# Patient Record
Sex: Male | Born: 1940
Health system: Southern US, Community
[De-identification: ages and names within clinical notes are randomized; demographics above are authoritative.]

## PROBLEM LIST (undated history)

## (undated) DIAGNOSIS — E039 Hypothyroidism, unspecified: Secondary | ICD-10-CM

## (undated) DIAGNOSIS — M51369 Other intervertebral disc degeneration, lumbar region without mention of lumbar back pain or lower extremity pain: Secondary | ICD-10-CM

## (undated) DIAGNOSIS — M48061 Spinal stenosis, lumbar region without neurogenic claudication: Secondary | ICD-10-CM

## (undated) DIAGNOSIS — I10 Essential (primary) hypertension: Secondary | ICD-10-CM

## (undated) DIAGNOSIS — M810 Age-related osteoporosis without current pathological fracture: Secondary | ICD-10-CM

## (undated) DIAGNOSIS — I5189 Other ill-defined heart diseases: Secondary | ICD-10-CM

## (undated) DIAGNOSIS — I5022 Chronic systolic (congestive) heart failure: Secondary | ICD-10-CM

## (undated) DIAGNOSIS — Z79899 Other long term (current) drug therapy: Secondary | ICD-10-CM

## (undated) DIAGNOSIS — R7303 Prediabetes: Secondary | ICD-10-CM

## (undated) DIAGNOSIS — M1990 Unspecified osteoarthritis, unspecified site: Secondary | ICD-10-CM

## (undated) DIAGNOSIS — N1832 Chronic kidney disease, stage 3b: Secondary | ICD-10-CM

## (undated) DIAGNOSIS — I447 Left bundle-branch block, unspecified: Secondary | ICD-10-CM

## (undated) DIAGNOSIS — I4891 Unspecified atrial fibrillation: Secondary | ICD-10-CM

## (undated) DIAGNOSIS — Z8619 Personal history of other infectious and parasitic diseases: Secondary | ICD-10-CM

## (undated) DIAGNOSIS — K219 Gastro-esophageal reflux disease without esophagitis: Secondary | ICD-10-CM

## (undated) DIAGNOSIS — E785 Hyperlipidemia, unspecified: Secondary | ICD-10-CM

## (undated) DIAGNOSIS — M5136 Other intervertebral disc degeneration, lumbar region: Secondary | ICD-10-CM

## (undated) DIAGNOSIS — J449 Chronic obstructive pulmonary disease, unspecified: Secondary | ICD-10-CM

## (undated) DIAGNOSIS — T7840XA Allergy, unspecified, initial encounter: Secondary | ICD-10-CM

## (undated) DIAGNOSIS — Z972 Presence of dental prosthetic device (complete) (partial): Secondary | ICD-10-CM

## (undated) DIAGNOSIS — I739 Peripheral vascular disease, unspecified: Secondary | ICD-10-CM

## (undated) DIAGNOSIS — Z7901 Long term (current) use of anticoagulants: Secondary | ICD-10-CM

## (undated) DIAGNOSIS — T8859XA Other complications of anesthesia, initial encounter: Secondary | ICD-10-CM

## (undated) DIAGNOSIS — I420 Dilated cardiomyopathy: Secondary | ICD-10-CM

## (undated) HISTORY — DX: Personal history of other infectious and parasitic diseases: Z86.19

## (undated) HISTORY — DX: Essential (primary) hypertension: I10

## (undated) HISTORY — DX: Allergy, unspecified, initial encounter: T78.40XA

## (undated) HISTORY — DX: Hyperlipidemia, unspecified: E78.5

## (undated) HISTORY — DX: Unspecified osteoarthritis, unspecified site: M19.90

## (undated) HISTORY — DX: Gastro-esophageal reflux disease without esophagitis: K21.9

## (undated) HISTORY — PX: BACK SURGERY: SHX140

---

## 2001-07-13 ENCOUNTER — Inpatient Hospital Stay (HOSPITAL_COMMUNITY): Admission: EM | Admit: 2001-07-13 | Discharge: 2001-07-19 | Payer: Self-pay | Admitting: Emergency Medicine

## 2001-07-13 ENCOUNTER — Encounter: Payer: Self-pay | Admitting: Emergency Medicine

## 2001-07-14 ENCOUNTER — Encounter: Payer: Self-pay | Admitting: Neurosurgery

## 2001-07-15 ENCOUNTER — Encounter: Payer: Self-pay | Admitting: Neurosurgery

## 2004-03-27 HISTORY — PX: UPPER GASTROINTESTINAL ENDOSCOPY: SHX188

## 2005-09-28 HISTORY — PX: OTHER SURGICAL HISTORY: SHX169

## 2005-10-22 ENCOUNTER — Ambulatory Visit: Payer: Self-pay | Admitting: Unknown Physician Specialty

## 2009-06-20 ENCOUNTER — Ambulatory Visit: Payer: Self-pay | Admitting: Unknown Physician Specialty

## 2009-06-20 LAB — HM COLONOSCOPY

## 2012-09-16 ENCOUNTER — Ambulatory Visit: Payer: Self-pay | Admitting: Unknown Physician Specialty

## 2012-10-04 DIAGNOSIS — I509 Heart failure, unspecified: Secondary | ICD-10-CM | POA: Insufficient documentation

## 2012-10-28 HISTORY — PX: OTHER SURGICAL HISTORY: SHX169

## 2012-11-17 ENCOUNTER — Ambulatory Visit: Payer: Self-pay | Admitting: Cardiology

## 2012-11-17 DIAGNOSIS — I2510 Atherosclerotic heart disease of native coronary artery without angina pectoris: Secondary | ICD-10-CM

## 2012-11-17 HISTORY — PX: LEFT HEART CATH AND CORONARY ANGIOGRAPHY: CATH118249

## 2012-11-17 HISTORY — DX: Atherosclerotic heart disease of native coronary artery without angina pectoris: I25.10

## 2013-02-17 DIAGNOSIS — I420 Dilated cardiomyopathy: Secondary | ICD-10-CM | POA: Insufficient documentation

## 2013-02-17 DIAGNOSIS — Z9889 Other specified postprocedural states: Secondary | ICD-10-CM | POA: Insufficient documentation

## 2013-06-02 ENCOUNTER — Ambulatory Visit: Payer: Self-pay | Admitting: Cardiology

## 2013-06-21 HISTORY — PX: DOPPLER ECHOCARDIOGRAPHY: SHX263

## 2013-10-31 LAB — LIPID PANEL
Cholesterol: 129 mg/dL (ref 0–200)
HDL: 47 mg/dL (ref 35–70)
LDL CALC: 60 mg/dL
TRIGLYCERIDES: 111 mg/dL (ref 40–160)

## 2014-05-23 LAB — PSA: PSA: 1.6

## 2014-10-20 NOTE — Op Note (Signed)
PATIENT NAME:  [name], [name] MR#:  [id number] DATE OF BIRTH:  22-Feb-1941  DATE OF PROCEDURE:  06/02/2013  REFERRING PHYSICIAN: Dr. Caryn Section.  PREPROCEDURE DIAGNOSIS: Atrial fibrillation.   PROCEDURE: Elective DC cardioversion.   INDICATION: The patient is a 73 year old gentleman with history of severe dilated cardiomyopathy and atrial fibrillation.   PROCEDURE: The patient was brought to the special holding area in a fasting state. The patient received 150 mg of propofol. The patient received 2 shocks at 75 and 150 joules, and converted to sinus rhythm. There were no complications. ____________________________ Isaias Cowman, MD ap:aw D: 06/02/2013 07:44:56 ET T: 06/02/2013 08:05:46 ET JOB#: [id number]  cc: Isaias Cowman, MD, <Dictator> Isaias Cowman MD ELECTRONICALLY SIGNED 06/17/2013 8:51

## 2014-11-22 LAB — BASIC METABOLIC PANEL
BUN: 15 mg/dL (ref 4–21)
Creatinine: 1.4 mg/dL — AB (ref 0.6–1.3)
GLUCOSE: 95 mg/dL
POTASSIUM: 3.8 mmol/L (ref 3.4–5.3)
SODIUM: 139 mmol/L (ref 137–147)

## 2014-11-22 LAB — TSH: TSH: 7.99 u[IU]/mL — AB (ref 0.41–5.90)

## 2014-12-18 ENCOUNTER — Other Ambulatory Visit: Payer: Self-pay | Admitting: Family Medicine

## 2014-12-18 DIAGNOSIS — I4891 Unspecified atrial fibrillation: Secondary | ICD-10-CM

## 2014-12-18 DIAGNOSIS — E039 Hypothyroidism, unspecified: Secondary | ICD-10-CM

## 2014-12-18 DIAGNOSIS — I10 Essential (primary) hypertension: Secondary | ICD-10-CM

## 2014-12-18 DIAGNOSIS — E785 Hyperlipidemia, unspecified: Secondary | ICD-10-CM

## 2014-12-18 NOTE — Telephone Encounter (Signed)
Pt contacted office for refill request on the following medications:  Xarelto 20mg , Lovastatin 20mg , Lisinopril-Hydrochlorothiazide10-12.5mg  and Levothyroxine Sodium 75MCG.  Pt is requesting a 90 day supply.   [facility] mail order Phone [phone] option 2.  [id number], Janet/MJ

## 2014-12-19 DIAGNOSIS — E785 Hyperlipidemia, unspecified: Secondary | ICD-10-CM | POA: Insufficient documentation

## 2014-12-19 DIAGNOSIS — E039 Hypothyroidism, unspecified: Secondary | ICD-10-CM | POA: Insufficient documentation

## 2014-12-19 DIAGNOSIS — I10 Essential (primary) hypertension: Secondary | ICD-10-CM | POA: Insufficient documentation

## 2014-12-19 DIAGNOSIS — I4891 Unspecified atrial fibrillation: Secondary | ICD-10-CM | POA: Insufficient documentation

## 2014-12-19 MED ORDER — LISINOPRIL-HYDROCHLOROTHIAZIDE 10-12.5 MG PO TABS: 1.0000 | ORAL_TABLET | Freq: Every day | ORAL | Status: DC

## 2014-12-19 MED ORDER — LOVASTATIN 20 MG PO TABS: 20.0000 mg | ORAL_TABLET | Freq: Every day | ORAL | Status: DC

## 2014-12-19 MED ORDER — LEVOTHYROXINE SODIUM 75 MCG PO TABS: 75.0000 ug | ORAL_TABLET | Freq: Every day | ORAL | Status: DC

## 2014-12-19 MED ORDER — XARELTO 20 MG PO TABS: 20.0000 mg | ORAL_TABLET | Freq: Every day | ORAL | Status: DC

## 2014-12-20 ENCOUNTER — Telehealth: Payer: Self-pay | Admitting: Family Medicine

## 2014-12-22 ENCOUNTER — Other Ambulatory Visit: Payer: Self-pay | Admitting: Family Medicine

## 2014-12-22 DIAGNOSIS — E785 Hyperlipidemia, unspecified: Secondary | ICD-10-CM

## 2014-12-22 DIAGNOSIS — E039 Hypothyroidism, unspecified: Secondary | ICD-10-CM

## 2014-12-22 DIAGNOSIS — I10 Essential (primary) hypertension: Secondary | ICD-10-CM

## 2014-12-22 DIAGNOSIS — I4891 Unspecified atrial fibrillation: Secondary | ICD-10-CM

## 2014-12-22 MED ORDER — LISINOPRIL-HYDROCHLOROTHIAZIDE 10-12.5 MG PO TABS: 1.0000 | ORAL_TABLET | Freq: Every day | ORAL | Status: DC

## 2014-12-22 MED ORDER — LEVOTHYROXINE SODIUM 75 MCG PO TABS: 75.0000 ug | ORAL_TABLET | Freq: Every day | ORAL | Status: DC

## 2014-12-22 MED ORDER — XARELTO 20 MG PO TABS: 20.0000 mg | ORAL_TABLET | Freq: Every day | ORAL | Status: DC

## 2014-12-22 MED ORDER — LOVASTATIN 20 MG PO TABS: 20.0000 mg | ORAL_TABLET | Freq: Every day | ORAL | Status: DC

## 2015-05-11 ENCOUNTER — Ambulatory Visit (INDEPENDENT_AMBULATORY_CARE_PROVIDER_SITE_OTHER): Payer: Medicare Other | Admitting: Family Medicine

## 2015-05-11 ENCOUNTER — Encounter: Payer: Self-pay | Admitting: Family Medicine

## 2015-05-11 VITALS — BP 108/58 | HR 66 | Temp 97.8°F | Resp 18 | Wt 253.4 lb

## 2015-05-11 DIAGNOSIS — J36 Peritonsillar abscess: Secondary | ICD-10-CM | POA: Diagnosis not present

## 2015-05-11 MED ORDER — AMOXICILLIN-POT CLAVULANATE 875-125 MG PO TABS: 1.0000 | ORAL_TABLET | Freq: Two times a day (BID) | ORAL | Status: DC

## 2015-05-11 NOTE — Progress Notes (Signed)
Subjective:     Patient ID: [name] Sr., male   DOB: [birth year], 74 y.o.   MRN: [id number]  HPI  Chief Complaint  Patient presents with  . Sore Throat    Patient comes in office today with concerns of sore throat on his right side more so than left, patient states that he has had this symptom for the past 4 days. Patient reports pain in the right ear, and difficulty swallowing liquids or solids  States he has been using saline rinses with little improvement. No exposure to children. Hx of uvulitis in April of this year.   Review of Systems  Constitutional: Negative for fever and chills.       Objective:   Physical Exam  Constitutional: He appears well-developed and well-nourished. No distress.  Ears: T.M's intact without inflammation Throat: Right sided mass effect from uvula to right tonsil which is partially obscured. Neck: no cervical adenopathy Lungs: clear     Assessment:    1. Peritonsillar abscess - Ambulatory referral to ENT - amoxicillin-clavulanate (AUGMENTIN) 875-125 MG tablet; Take 1 tablet by mouth 2 (two) times daily.  Dispense: 20 tablet; Refill: 0    Plan:    Per ENT

## 2015-05-11 NOTE — Patient Instructions (Signed)
F/u with ENT today.

## 2015-05-29 DIAGNOSIS — J309 Allergic rhinitis, unspecified: Secondary | ICD-10-CM | POA: Insufficient documentation

## 2015-05-29 DIAGNOSIS — K449 Diaphragmatic hernia without obstruction or gangrene: Secondary | ICD-10-CM | POA: Insufficient documentation

## 2015-05-29 DIAGNOSIS — M545 Low back pain, unspecified: Secondary | ICD-10-CM | POA: Insufficient documentation

## 2015-05-29 DIAGNOSIS — M1990 Unspecified osteoarthritis, unspecified site: Secondary | ICD-10-CM | POA: Insufficient documentation

## 2015-05-29 DIAGNOSIS — I8310 Varicose veins of unspecified lower extremity with inflammation: Secondary | ICD-10-CM | POA: Insufficient documentation

## 2015-05-29 DIAGNOSIS — K219 Gastro-esophageal reflux disease without esophagitis: Secondary | ICD-10-CM | POA: Insufficient documentation

## 2015-05-29 DIAGNOSIS — M7989 Other specified soft tissue disorders: Secondary | ICD-10-CM | POA: Insufficient documentation

## 2015-05-31 ENCOUNTER — Ambulatory Visit (INDEPENDENT_AMBULATORY_CARE_PROVIDER_SITE_OTHER): Payer: Medicare Other | Admitting: Family Medicine

## 2015-05-31 ENCOUNTER — Encounter: Payer: Self-pay | Admitting: Family Medicine

## 2015-05-31 VITALS — BP 110/60 | HR 71 | Temp 98.3°F | Resp 16 | Ht 74.0 in | Wt 251.0 lb

## 2015-05-31 DIAGNOSIS — I4891 Unspecified atrial fibrillation: Secondary | ICD-10-CM

## 2015-05-31 DIAGNOSIS — E039 Hypothyroidism, unspecified: Secondary | ICD-10-CM | POA: Diagnosis not present

## 2015-05-31 DIAGNOSIS — I10 Essential (primary) hypertension: Secondary | ICD-10-CM

## 2015-05-31 DIAGNOSIS — Z125 Encounter for screening for malignant neoplasm of prostate: Secondary | ICD-10-CM

## 2015-05-31 DIAGNOSIS — J309 Allergic rhinitis, unspecified: Secondary | ICD-10-CM | POA: Diagnosis not present

## 2015-05-31 DIAGNOSIS — E785 Hyperlipidemia, unspecified: Secondary | ICD-10-CM

## 2015-05-31 DIAGNOSIS — Z23 Encounter for immunization: Secondary | ICD-10-CM

## 2015-05-31 DIAGNOSIS — Z0000 Encounter for general adult medical examination without abnormal findings: Secondary | ICD-10-CM | POA: Diagnosis not present

## 2015-05-31 NOTE — Patient Instructions (Signed)
May take OTC loratadine (Claritin) or fexofenadine (Allegra) Do NOT take any decongestants

## 2015-05-31 NOTE — Progress Notes (Signed)
Patient: [name]., Male    DOB: 1941/05/04, 74 y.o.   MRN: [id number] Visit Date: 05/31/2015  Today's Provider: Lelon Huh, MD   Chief Complaint  Patient presents with  . Medicare Wellness  . Hypertension  . Hyperlipidemia  . Hypothyroidism  . Gastroesophageal Reflux   Subjective:    Annual wellness visit [name] SIXTO Sr. is a 74 y.o. male. He feels fairly well. He reports exercising yes. He reports he is sleeping fairly well.  -----------------------------------------------------------  Follow-up for GERD from 05/23/2014; started Nexium 20 mg prn. Follow-up for hypothyroidism from 11/22/2014; increased levothyroxine to 75 mcg qd.   Patient is having some sinus issues today with sore throat, nasal congestion, runny nose, sneezing and wheezing.   Hypertension, follow-up:  BP Readings from Last 3 Encounters:  05/31/15 110/60  05/11/15 108/58    He was last seen for hypertension 6 months ago.  BP at that visit was 122/62. Management since that visit includes; no changes were made.He reports good compliance with treatment. He is not having side effects. none  He is exercising. He is adherent to low salt diet.   Outside blood pressures are normal. He is experiencing none.  Patient denies none.   Cardiovascular risk factors include none.  Use of agents associated with hypertension: none.   ----------------------------------------------------------------------    Lipid/Cholesterol, Follow-up:   Last seen for this 10/04/2013.  Management since that visit includes continued lovastatin .  Last Lipid Panel:    Component Value Date/Time   CHOL 129 10/31/2013   TRIG 111 10/31/2013   HDL 47 10/31/2013   LDLCALC 60 10/31/2013    He reports good compliance with treatment. He is not having side effects. none  Wt Readings from Last 3 Encounters:  05/31/15 251 lb (113.853 kg)  05/11/15 253 lb 6.4 oz (114.941 kg)     ----------------------------------------------------------------------    Review of Systems  Constitutional: Negative.   HENT: Positive for rhinorrhea, sneezing and sore throat.   Eyes: Negative.   Respiratory: Positive for wheezing.   Cardiovascular: Negative.   Gastrointestinal: Negative.   Endocrine: Negative.   Genitourinary: Positive for frequency.  Musculoskeletal: Positive for back pain and arthralgias.  Skin: Negative.   Allergic/Immunologic: Positive for environmental allergies.  Neurological: Negative.   Hematological: Negative.   Psychiatric/Behavioral: Negative.     Social History   Social History  . Marital Status: Married    Spouse Name: N/A  . Number of Children: 2  . Years of Education: N/A   Occupational History  . Retired/ Disabled    Social History Main Topics  . Smoking status: Former Smoker -- 2.00 packs/day for 50 years    Types: Cigarettes    Quit date: 07/01/2007  . Smokeless tobacco: Never Used  . Alcohol Use: No  . Drug Use: No  . Sexual Activity: Not on file   Other Topics Concern  . Not on file   Social History Narrative    Patient Active Problem List   Diagnosis Date Noted  . Allergic rhinitis 05/29/2015  . Arthritis 05/29/2015  . GERD (gastroesophageal reflux disease) 05/29/2015  . Hiatal hernia 05/29/2015  . LBP (low back pain) 05/29/2015  . Pure hypercholesterolemia 05/29/2015  . Swelling of limb 05/29/2015  . Varicose veins of lower extremities with inflammation 05/29/2015  . Atrial fibrillation ([place]) 12/19/2014  . Hyperlipidemia 12/19/2014  . Hypothyroidism 12/19/2014  . Hypertension 12/19/2014  . History of cardiac catheterization 02/17/2013  . Congestive  cardiomyopathy ([place]) 02/17/2013  . Congestive heart failure, severe ([place]) 10/04/2012    Past Surgical History  Procedure Laterality Date  . Polyp excision  09/2005  . Back surgery      x5  . Doppler echocardiography  06/21/2013    EF=35% while in Sinus  Rhythm  . Myocardial perfusion scan  10/2012    severe global LV enlargement. Mild RV enlargement. Mild LVH. Mild mitral and tricuspid insufficency  . Upper gastrointestinal endoscopy  03/27/2004    Gastropathy, Gastritis; Duodenopathy    His family history includes Alzheimer's disease in his mother; Colon cancer in his brother; Heart attack in his father.    Previous Medications   AMIODARONE (PACERONE) 200 MG TABLET    TAKE 1 TABLET (200 MG TOTAL) BY MOUTH ONCE DAILY.   AMOXICILLIN-CLAVULANATE (AUGMENTIN) 875-125 MG TABLET    Take 1 tablet by mouth 2 (two) times daily.   ESOMEPRAZOLE (NEXIUM 24HR) 20 MG CAPSULE    Take 1 tablet by mouth daily as needed.   FLUTICASONE (FLONASE) 50 MCG/ACT NASAL SPRAY    Place 2 sprays into both nostrils at bedtime.   LEVOTHYROXINE (SYNTHROID, LEVOTHROID) 75 MCG TABLET    Take 1 tablet (75 mcg total) by mouth daily.   LISINOPRIL-HYDROCHLOROTHIAZIDE (PRINZIDE,ZESTORETIC) 10-12.5 MG PER TABLET    Take 1 tablet by mouth daily.   LOVASTATIN (MEVACOR) 20 MG TABLET    Take 1 tablet (20 mg total) by mouth daily.   METOPROLOL SUCCINATE (TOPROL-XL) 25 MG 24 HR TABLET    1 TABLET BY MOUTH ONCE A DAY   SALSALATE (DISALCID) 500 MG TABLET    Take 2 tablets by mouth 3 (three) times daily. for back pain   XARELTO 20 MG TABS TABLET    Take 1 tablet (20 mg total) by mouth daily.    Patient Care Team: Birdie Sons, MD as PCP - General (Family Medicine) Algernon Huxley, MD as Referring Physician (Vascular Surgery) Isaias Cowman, MD as Consulting Physician (Cardiology)     Objective:   Vitals: BP 110/60 mmHg  Pulse 71  Temp(Src) 98.3 F (36.8 C) (Oral)  Resp 16  Ht 6\' 2"  (1.88 m)  Wt 251 lb (113.853 kg)  BMI 32.21 kg/m2  SpO2 100%  Physical Exam   General Appearance:    Alert, cooperative, no distress, appears stated age  Head:    Normocephalic, without obvious abnormality, atraumatic  Eyes:    PERRL, conjunctiva/corneas clear, EOM's intact, fundi     benign, both eyes       Ears:    Normal TM's and external ear canals, both ears  Nose:   Nares normal, septum midline, mucosa normal, no drainage   or sinus tenderness  Throat:   Lips, mucosa, and tongue normal; teeth and gums normal  Neck:   Supple, symmetrical, trachea midline, no adenopathy;       thyroid:  No enlargement/tenderness/nodules; no carotid   bruit or JVD  Back:     Symmetric, no curvature, ROM normal, no CVA tenderness  Lungs:     Clear to auscultation bilaterally, respirations unlabored  Chest wall:    No tenderness or deformity  Heart:    Regular rate and rhythm, S1 and S2 normal, no murmur, rub   or gallop  Abdomen:     Soft, non-tender, bowel sounds active all four quadrants,    no masses, no organomegaly  Genitalia:    deferred  Rectal:    deferred  Extremities:   Extremities normal,  atraumatic, no cyanosis or edema  Pulses:   2+ and symmetric all extremities  Skin:   Skin color, texture, turgor normal, no rashes or lesions  Lymph nodes:   Cervical, supraclavicular, and axillary nodes normal  Neurologic:   CNII-XII intact. Normal strength, sensation and reflexes      throughout    Activities of Daily Living In your present state of health, do you have any difficulty performing the following activities: 05/31/2015  Hearing? N  Difficulty concentrating or making decisions? N  Walking or climbing stairs? N  Dressing or bathing? Y  Doing errands, shopping? N    Fall Risk Assessment Fall Risk  05/31/2015  Falls in the past year? No     Depression Screen PHQ 2/9 Scores 05/31/2015  PHQ - 2 Score 0  PHQ- 9 Score 0    Cognitive Testing - 6-CIT  Correct? Score   What year is it? yes 0 0 or 4  What month is it? yes 0 0 or 3  Memorize:    Pia Mau,  44,  [place],      What time is it? (within 1 hour) yes 0 0 or 3  Count backwards from 20 yes 0 0, 2, or 4  Name the months of the year yes 0 0, 2, or 4  Repeat name & address above yes 4 0, 2, 4,  6, 8, or 10       TOTAL SCORE  4/28   Interpretation:  Normal  Normal (0-7) Abnormal (8-28)    Audit-C Alcohol Use Screening  Question Answer Points  How often do you have alcoholic drink? never 0  On days you do drink alcohol, how many drinks do you typically consume? n/a 0  How oftey will you drink 6 or more in a total? never 0  Total Score:  0   A score of 3 or more in women, and 4 or more in men indicates increased risk for alcohol abuse, EXCEPT if all of the points are from question 1.     Assessment & Plan:     Annual Wellness Visit  Reviewed patient's Family Medical History Reviewed and updated list of patient's medical providers Assessment of cognitive impairment was done Assessed patient's functional ability Established a written schedule for health screening [place] Completed and Reviewed  Exercise Activities and Dietary recommendations Goals    None      Immunization History  Administered Date(s) Administered  . Pneumococcal Conjugate-13 05/23/2014  . Tdap 04/21/2014    Health Maintenance  Topic Date Due  . ZOSTAVAX  07/27/2002  . INFLUENZA VACCINE  01/29/2015  . PNA vac Low Risk Adult (2 of 2 - PPSV23) 05/24/2015  . COLONOSCOPY  06/21/2019  . TETANUS/TDAP  04/21/2024      Discussed health benefits of physical activity, and encouraged him to engage in regular exercise appropriate for his age and condition.    -------------------------------------------------------------------------------- 1. Annual physical exam   2. Atrial fibrillation, unspecified type ([place]) On amiodarone. Had recent follow up with Dr. Saralyn Pilar. Has not had eye exam for years.  - Ambulatory referral to Ophthalmology  3. Hyperlipidemia He is tolerating lovastatin well with no adverse effects.   - Lipid panel - ALT  4. Essential hypertension Well controlled.  Continue current medications.    - Renal function panel - Magnesium  5.  Hypothyroidism, unspecified hypothyroidism type  - TSH  6. Need for influenza vaccination  - Flu vaccine  HIGH DOSE PF  7. Prostate cancer screening  - PSA  8. Need for pneumococcal vaccination  - Pneumococcal polysaccharide vaccine 23-valent greater than or equal to 2yo subcutaneous/IM  9. Allergic rhinitis, unspecified allergic rhinitis type

## 2015-06-01 LAB — LIPID PANEL
CHOLESTEROL TOTAL: 144 mg/dL (ref 100–199)
Chol/HDL Ratio: 3.3 ratio units (ref 0.0–5.0)
HDL: 44 mg/dL (ref >39)
LDL Calculated: 63 mg/dL (ref 0–99)
TRIGLYCERIDES: 187 mg/dL — AB (ref 0–149)
VLDL CHOLESTEROL CAL: 37 mg/dL (ref 5–40)

## 2015-06-01 LAB — ALT: ALT: 20 IU/L (ref 0–44)

## 2015-06-01 LAB — TSH: TSH: 3.83 u[IU]/mL (ref 0.450–4.500)

## 2015-06-01 LAB — RENAL FUNCTION PANEL
ALBUMIN: 4.5 g/dL (ref 3.5–4.8)
BUN/Creatinine Ratio: 10 (ref 10–22)
BUN: 16 mg/dL (ref 8–27)
CALCIUM: 9.9 mg/dL (ref 8.6–10.2)
CO2: 24 mmol/L (ref 18–29)
Chloride: 97 mmol/L (ref 97–106)
Creatinine, Ser: 1.56 mg/dL — ABNORMAL HIGH (ref 0.76–1.27)
GFR calc Af Amer: 51 mL/min/{1.73_m2} — ABNORMAL LOW (ref >59)
GFR calc non Af Amer: 44 mL/min/{1.73_m2} — ABNORMAL LOW (ref >59)
GLUCOSE: 111 mg/dL — AB (ref 65–99)
PHOSPHORUS: 2.9 mg/dL (ref 2.5–4.5)
POTASSIUM: 4.6 mmol/L (ref 3.5–5.2)
SODIUM: 138 mmol/L (ref 136–144)

## 2015-06-01 LAB — PSA: PROSTATE SPECIFIC AG, SERUM: 1.7 ng/mL (ref 0.0–4.0)

## 2015-06-01 LAB — MAGNESIUM: Magnesium: 1.9 mg/dL (ref 1.6–2.3)

## 2015-06-04 DIAGNOSIS — Z23 Encounter for immunization: Secondary | ICD-10-CM

## 2015-06-27 ENCOUNTER — Ambulatory Visit (INDEPENDENT_AMBULATORY_CARE_PROVIDER_SITE_OTHER): Payer: Medicare Other | Admitting: Family Medicine

## 2015-06-27 ENCOUNTER — Encounter: Payer: Self-pay | Admitting: Family Medicine

## 2015-06-27 VITALS — BP 110/60 | HR 65 | Temp 97.6°F | Resp 18 | Wt 252.0 lb

## 2015-06-27 DIAGNOSIS — M5441 Lumbago with sciatica, right side: Secondary | ICD-10-CM | POA: Diagnosis not present

## 2015-06-27 MED ORDER — PREDNISONE 10 MG PO TABS: ORAL_TABLET | ORAL | Status: DC

## 2015-06-27 MED ORDER — CYCLOBENZAPRINE HCL 10 MG PO TABS: 10.0000 mg | ORAL_TABLET | Freq: Three times a day (TID) | ORAL | Status: DC | PRN

## 2015-06-27 NOTE — Progress Notes (Signed)
Patient: [name] SCHWABE Sr. Male    DOB: 1941/06/24   74 y.o.   MRN: [id number] Visit Date: 06/27/2015  Today's Provider: Lelon Huh, MD   Chief Complaint  Patient presents with  . Back Pain   Subjective:    Back Pain This is a recurrent problem. Episode onset: flared up 10 days ago. The problem occurs constantly. The problem is unchanged. The pain is present in the lumbar spine. Quality: pinching pain. Radiates to: right hip down to right knee. The pain is severe. The symptoms are aggravated by lying down and sitting. Associated symptoms include leg pain, tingling and weakness. Pertinent negatives include no abdominal pain, bladder incontinence, bowel incontinence, chest pain, dysuria, fever, headaches or numbness. Risk factors: history of back problems; has had 5 back surgeries. Treatments tried: massaging and vibrating pad. The treatment provided mild (temporary relief) relief.  Patient states he has had similar back pain in the past and the only thing that relieved the pain was prednisone and muscle relaxer's.   Patient Active Problem List   Diagnosis Date Noted  . Allergic rhinitis 05/29/2015  . Arthritis 05/29/2015  . GERD (gastroesophageal reflux disease) 05/29/2015  . Hiatal hernia 05/29/2015  . LBP (low back pain) 05/29/2015  . Swelling of limb 05/29/2015  . Varicose veins of lower extremities with inflammation 05/29/2015  . Atrial fibrillation ([place]) 12/19/2014  . Hyperlipidemia 12/19/2014  . Hypothyroidism 12/19/2014  . Hypertension 12/19/2014  . History of cardiac catheterization 02/17/2013  . Congestive cardiomyopathy ([place]) 02/17/2013  . Congestive heart failure, severe ([place]) 10/04/2012   Past Surgical History  Procedure Laterality Date  . Polyp excision  09/2005  . Back surgery      x5  . Doppler echocardiography  06/21/2013    EF=35% while in Sinus Rhythm  . Myocardial perfusion scan  10/2012    severe global LV enlargement. Mild RV enlargement.  Mild LVH. Mild mitral and tricuspid insufficency  . Upper gastrointestinal endoscopy  03/27/2004    Gastropathy, Gastritis; Duodenopathy       No Known Allergies Previous Medications   AMIODARONE (PACERONE) 200 MG TABLET    TAKE 1 TABLET (200 MG TOTAL) BY MOUTH ONCE DAILY.   AMOXICILLIN-CLAVULANATE (AUGMENTIN) 875-125 MG TABLET    Take 1 tablet by mouth 2 (two) times daily.   ESOMEPRAZOLE (NEXIUM 24HR) 20 MG CAPSULE    Take 1 tablet by mouth daily as needed.   FLUTICASONE (FLONASE) 50 MCG/ACT NASAL SPRAY    Place 2 sprays into both nostrils at bedtime.   LEVOTHYROXINE (SYNTHROID, LEVOTHROID) 75 MCG TABLET    Take 1 tablet (75 mcg total) by mouth daily.   LISINOPRIL-HYDROCHLOROTHIAZIDE (PRINZIDE,ZESTORETIC) 10-12.5 MG PER TABLET    Take 1 tablet by mouth daily.   LOVASTATIN (MEVACOR) 20 MG TABLET    Take 1 tablet (20 mg total) by mouth daily.   METOPROLOL SUCCINATE (TOPROL-XL) 25 MG 24 HR TABLET    1 TABLET BY MOUTH ONCE A DAY   SALSALATE (DISALCID) 500 MG TABLET    Take 2 tablets by mouth 3 (three) times daily. for back pain   XARELTO 20 MG TABS TABLET    Take 1 tablet (20 mg total) by mouth daily.    Review of Systems  Constitutional: Negative for fever, chills and appetite change.  Respiratory: Negative for chest tightness, shortness of breath and wheezing.   Cardiovascular: Negative for chest pain and palpitations.  Gastrointestinal: Negative for nausea, vomiting, abdominal pain and bowel  incontinence.  Genitourinary: Negative for bladder incontinence and dysuria.  Musculoskeletal: Positive for back pain. Negative for joint swelling.  Neurological: Positive for tingling and weakness. Negative for numbness and headaches.    Social History  Substance Use Topics  . Smoking status: Former Smoker -- 2.00 packs/day for 50 years    Types: Cigarettes    Quit date: 07/01/2007  . Smokeless tobacco: Never Used  . Alcohol Use: No   Objective:   BP 110/60 mmHg  Pulse 65  Temp(Src)  97.6 F (36.4 C) (Oral)  Resp 18  Wt 252 lb (114.306 kg)  SpO2 100%  Physical Exam  General appearance: alert, well developed, well nourished, cooperative and in no distress Head: Normocephalic, without obvious abnormality, atraumatic MS: Well healed old surgical scars over lumbar spine. Diffuse tenderness over mid and right lumbar spine.  Neurologic: Mental status: Alert, oriented to person, place, and time, thought content appropriate.     Assessment & Plan:     1. Right-sided low back pain with right-sided sciatica Flare up of chronic LBP - predniSONE (DELTASONE) 10 MG tablet; 6 tablets for 2 days, then 5 for 2 days, then 4 for 2 days, then 3 for 2 days, then 2 for 2 days, then 1 for 2 days.  Dispense: 42 tablet; Refill: 0 - cyclobenzaprine (FLEXERIL) 10 MG tablet; Take 1 tablet (10 mg total) by mouth 3 (three) times daily as needed for muscle spasms.  Dispense: 30 tablet; Refill: 1  Call if symptoms change or if not rapidly improving.          Lelon Huh, MD  [facility]

## 2015-06-28 ENCOUNTER — Telehealth: Payer: Self-pay | Admitting: Family Medicine

## 2015-06-28 DIAGNOSIS — I4891 Unspecified atrial fibrillation: Secondary | ICD-10-CM

## 2015-06-28 DIAGNOSIS — I10 Essential (primary) hypertension: Secondary | ICD-10-CM

## 2015-06-28 DIAGNOSIS — E785 Hyperlipidemia, unspecified: Secondary | ICD-10-CM

## 2015-06-28 DIAGNOSIS — E039 Hypothyroidism, unspecified: Secondary | ICD-10-CM

## 2015-06-28 NOTE — Telephone Encounter (Signed)
Daughter called saying her dad switched insurances and had to change pharmacies.   He will need new 90 refills on 4 of his Rxs.  XARELTO 20 MG TABS tablet Taking 12/22/14 -- Birdie Sons, MD Take 1 tablet (20 mg total) by  lovastatin (MEVACOR) 20 MG tablet metoprolol succinate (TOPROL-XL) 25 MG 24 hr tablet lisinopril-hydrochlorothiazide (PRINZIDE,ZESTORETIC) 10-12.5 MG per tablet levothyroxine (SYNTHROID, LEVOTHROID) 75 MCG tablet Taking 12/22/14 -- Birdie Sons, MD Take 1 tablet (75 mcg total) by mouth daily.  Will be using [facility] in [place] starting Jan1  Daughters Call back [phone]  Thanks [employer]

## 2015-07-03 MED ORDER — XARELTO 20 MG PO TABS: 20.0000 mg | ORAL_TABLET | Freq: Every day | ORAL | Status: DC

## 2015-07-03 MED ORDER — LISINOPRIL-HYDROCHLOROTHIAZIDE 10-12.5 MG PO TABS: 1.0000 | ORAL_TABLET | Freq: Every day | ORAL | Status: DC

## 2015-07-03 MED ORDER — LOVASTATIN 20 MG PO TABS: 20.0000 mg | ORAL_TABLET | Freq: Every day | ORAL | Status: DC

## 2015-07-03 MED ORDER — LEVOTHYROXINE SODIUM 75 MCG PO TABS: 75.0000 ug | ORAL_TABLET | Freq: Every day | ORAL | Status: DC

## 2015-07-03 NOTE — Telephone Encounter (Signed)
done

## 2015-07-17 ENCOUNTER — Telehealth: Payer: Self-pay | Admitting: Family Medicine

## 2015-07-17 NOTE — Telephone Encounter (Signed)
She is calling for her dad and wants to know what else to do regarding his back pain.  He seen you about two weeks ago and you gave him prednisone and a flexaril.  He is continuing to be in pain.   Please advise.  Her [phone].  Thanks [employer]

## 2015-07-18 ENCOUNTER — Encounter: Payer: Self-pay | Admitting: Family Medicine

## 2015-07-18 ENCOUNTER — Ambulatory Visit (INDEPENDENT_AMBULATORY_CARE_PROVIDER_SITE_OTHER): Payer: PPO | Admitting: Family Medicine

## 2015-07-18 VITALS — BP 110/50 | HR 71 | Temp 98.5°F | Resp 16 | Ht 74.0 in | Wt 255.0 lb

## 2015-07-18 DIAGNOSIS — M5441 Lumbago with sciatica, right side: Secondary | ICD-10-CM | POA: Diagnosis not present

## 2015-07-18 MED ORDER — PREDNISONE 10 MG PO TABS: ORAL_TABLET | ORAL | Status: AC

## 2015-07-18 NOTE — Progress Notes (Signed)
Patient: [name] MILES Sr. Male    DOB: 04-20-1941   75 y.o.   MRN: [id number] Visit Date: 07/18/2015  Today's Provider: Lelon Huh, MD   Chief Complaint  Patient presents with  . Back Pain   Subjective:    HPI Patient was last seen for back pain radiating to right leg 06/27/2015 and prescribed 12 day prednisone taper and cyclobenzaprine. He states pain nearly resolved while on prednisone, but returned before finish taper and is now just is bad.   No Known Allergies Previous Medications   AMIODARONE (PACERONE) 200 MG TABLET    TAKE 1 TABLET (200 MG TOTAL) BY MOUTH ONCE DAILY.   CYCLOBENZAPRINE (FLEXERIL) 10 MG TABLET    Take 1 tablet (10 mg total) by mouth 3 (three) times daily as needed for muscle spasms.   ESOMEPRAZOLE (NEXIUM 24HR) 20 MG CAPSULE    Take 1 tablet by mouth daily as needed.   FLUTICASONE (FLONASE) 50 MCG/ACT NASAL SPRAY    Place 2 sprays into both nostrils at bedtime.   LEVOTHYROXINE (SYNTHROID, LEVOTHROID) 75 MCG TABLET    Take 1 tablet (75 mcg total) by mouth daily.   LISINOPRIL-HYDROCHLOROTHIAZIDE (PRINZIDE,ZESTORETIC) 10-12.5 MG TABLET    Take 1 tablet by mouth daily.   LOVASTATIN (MEVACOR) 20 MG TABLET    Take 1 tablet (20 mg total) by mouth daily.   METOPROLOL SUCCINATE (TOPROL-XL) 25 MG 24 HR TABLET    1 TABLET BY MOUTH ONCE A DAY   SALSALATE (DISALCID) 500 MG TABLET    Take 2 tablets by mouth 3 (three) times daily. Reported on 07/18/2015   XARELTO 20 MG TABS TABLET    Take 1 tablet (20 mg total) by mouth daily.    Review of Systems  Constitutional: Negative for chills and appetite change.  Respiratory: Negative for chest tightness, shortness of breath and wheezing.   Cardiovascular: Negative for palpitations.  Gastrointestinal: Negative for nausea and vomiting.  Musculoskeletal: Positive for back pain.       Hip pain   Past Medical History  Diagnosis Date  . Hyperlipidemia   . Thyroid disease   . Hypertension   . GERD (gastroesophageal  reflux disease)   . History of chicken pox   . Atrial fibrillation ([place])   . Arthritis   . CHF (congestive heart failure) ([place])   . Allergy    Patient Active Problem List   Diagnosis Date Noted  . Allergic rhinitis 05/29/2015  . Arthritis 05/29/2015  . GERD (gastroesophageal reflux disease) 05/29/2015  . Hiatal hernia 05/29/2015  . LBP (low back pain) 05/29/2015  . Swelling of limb 05/29/2015  . Varicose veins of lower extremities with inflammation 05/29/2015  . Atrial fibrillation ([place]) 12/19/2014  . Hyperlipidemia 12/19/2014  . Hypothyroidism 12/19/2014  . Hypertension 12/19/2014  . History of cardiac catheterization 02/17/2013  . Congestive cardiomyopathy ([place]) 02/17/2013  . Congestive heart failure, severe ([place]) 10/04/2012   Past Surgical History  Procedure Laterality Date  . Polyp excision  09/2005  . Back surgery      x5  . Doppler echocardiography  06/21/2013    EF=35% while in Sinus Rhythm  . Myocardial perfusion scan  10/2012    severe global LV enlargement. Mild RV enlargement. Mild LVH. Mild mitral and tricuspid insufficency  . Upper gastrointestinal endoscopy  03/27/2004    Gastropathy, Gastritis; Duodenopathy     Social History  Substance Use Topics  . Smoking status: Former Smoker -- 2.00 packs/day for 50  years    Types: Cigarettes    Quit date: 07/01/2007  . Smokeless tobacco: Never Used  . Alcohol Use: No   Objective:   BP 110/50 mmHg  Pulse 71  Temp(Src) 98.5 F (36.9 C) (Oral)  Resp 16  Ht 6\' 2"  (1.88 m)  Wt 255 lb (115.667 kg)  BMI 32.73 kg/m2  SpO2 97%  Physical Exam  MS: Well healed old surgical scars over lumbar spine. Diffuse tenderness over mid and right lumbar spine.  Neurologic: Mental status: Alert, oriented to person, place, and time, thought content appropriate.     Assessment & Plan:     1. Right-sided low back pain with right-sided sciatica Responded briefly to oral steroid. Start back on prednisone taper and refer to  orthopedics.  - predniSONE (DELTASONE) 10 MG tablet; 6 tablets for 2 days, then 5 for 2 days, then 4 for 2 days, then 3 for 2 days, then 2 for 2 days, then 1 for 2 days.  Dispense: 42 tablet; Refill: 0 - AMB referral to orthopedics       Lelon Huh, MD  [facility]

## 2015-07-19 NOTE — Telephone Encounter (Signed)
Seen in office 07-18-15

## 2015-07-31 DIAGNOSIS — M1611 Unilateral primary osteoarthritis, right hip: Secondary | ICD-10-CM | POA: Diagnosis not present

## 2015-07-31 DIAGNOSIS — M545 Low back pain: Secondary | ICD-10-CM | POA: Diagnosis not present

## 2015-08-01 DIAGNOSIS — M47816 Spondylosis without myelopathy or radiculopathy, lumbar region: Secondary | ICD-10-CM | POA: Diagnosis not present

## 2015-08-01 DIAGNOSIS — M1611 Unilateral primary osteoarthritis, right hip: Secondary | ICD-10-CM | POA: Diagnosis not present

## 2015-08-08 DIAGNOSIS — H18003 Unspecified corneal deposit, bilateral: Secondary | ICD-10-CM | POA: Diagnosis not present

## 2015-08-17 DIAGNOSIS — M1611 Unilateral primary osteoarthritis, right hip: Secondary | ICD-10-CM | POA: Diagnosis not present

## 2015-09-07 ENCOUNTER — Other Ambulatory Visit: Payer: Self-pay | Admitting: Orthopedic Surgery

## 2015-09-07 DIAGNOSIS — M5136 Other intervertebral disc degeneration, lumbar region: Secondary | ICD-10-CM | POA: Diagnosis not present

## 2015-09-07 DIAGNOSIS — M4726 Other spondylosis with radiculopathy, lumbar region: Secondary | ICD-10-CM

## 2015-09-27 ENCOUNTER — Telehealth: Payer: Self-pay

## 2015-09-27 ENCOUNTER — Ambulatory Visit
Admission: RE | Admit: 2015-09-27 | Discharge: 2015-09-27 | Disposition: A | Discharge status: Home / Self Care | Payer: PPO | Source: Ambulatory Visit | Attending: Orthopedic Surgery | Admitting: Orthopedic Surgery

## 2015-09-27 ENCOUNTER — Other Ambulatory Visit: Payer: Self-pay | Admitting: Orthopedic Surgery

## 2015-09-27 DIAGNOSIS — M5136 Other intervertebral disc degeneration, lumbar region: Secondary | ICD-10-CM

## 2015-09-27 DIAGNOSIS — M4726 Other spondylosis with radiculopathy, lumbar region: Secondary | ICD-10-CM

## 2015-09-27 DIAGNOSIS — M4806 Spinal stenosis, lumbar region: Secondary | ICD-10-CM | POA: Insufficient documentation

## 2015-09-27 DIAGNOSIS — M5126 Other intervertebral disc displacement, lumbar region: Secondary | ICD-10-CM | POA: Diagnosis not present

## 2015-09-27 LAB — POCT I-STAT CREATININE: Creatinine, Ser: 2.4 mg/dL — ABNORMAL HIGH (ref 0.61–1.24)

## 2015-10-01 DIAGNOSIS — I429 Cardiomyopathy, unspecified: Secondary | ICD-10-CM | POA: Diagnosis not present

## 2015-10-01 DIAGNOSIS — E7800 Pure hypercholesterolemia, unspecified: Secondary | ICD-10-CM | POA: Diagnosis not present

## 2015-10-01 DIAGNOSIS — I420 Dilated cardiomyopathy: Secondary | ICD-10-CM | POA: Diagnosis not present

## 2015-10-01 DIAGNOSIS — R0602 Shortness of breath: Secondary | ICD-10-CM | POA: Diagnosis not present

## 2015-10-01 DIAGNOSIS — I5022 Chronic systolic (congestive) heart failure: Secondary | ICD-10-CM | POA: Diagnosis not present

## 2015-10-01 DIAGNOSIS — I480 Paroxysmal atrial fibrillation: Secondary | ICD-10-CM | POA: Diagnosis not present

## 2015-10-01 DIAGNOSIS — I10 Essential (primary) hypertension: Secondary | ICD-10-CM | POA: Diagnosis not present

## 2015-10-01 DIAGNOSIS — Z9889 Other specified postprocedural states: Secondary | ICD-10-CM | POA: Diagnosis not present

## 2015-10-30 DIAGNOSIS — M5416 Radiculopathy, lumbar region: Secondary | ICD-10-CM | POA: Diagnosis not present

## 2015-10-30 DIAGNOSIS — M5136 Other intervertebral disc degeneration, lumbar region: Secondary | ICD-10-CM | POA: Diagnosis not present

## 2015-11-08 ENCOUNTER — Other Ambulatory Visit: Payer: Self-pay | Admitting: Family Medicine

## 2015-11-30 DIAGNOSIS — M5136 Other intervertebral disc degeneration, lumbar region: Secondary | ICD-10-CM | POA: Diagnosis not present

## 2015-11-30 DIAGNOSIS — M4726 Other spondylosis with radiculopathy, lumbar region: Secondary | ICD-10-CM | POA: Diagnosis not present

## 2015-11-30 DIAGNOSIS — M5416 Radiculopathy, lumbar region: Secondary | ICD-10-CM | POA: Diagnosis not present

## 2015-12-27 DIAGNOSIS — M5136 Other intervertebral disc degeneration, lumbar region: Secondary | ICD-10-CM | POA: Diagnosis not present

## 2015-12-27 DIAGNOSIS — M5416 Radiculopathy, lumbar region: Secondary | ICD-10-CM | POA: Diagnosis not present

## 2016-01-25 DIAGNOSIS — M4726 Other spondylosis with radiculopathy, lumbar region: Secondary | ICD-10-CM | POA: Diagnosis not present

## 2016-01-25 DIAGNOSIS — M5416 Radiculopathy, lumbar region: Secondary | ICD-10-CM | POA: Diagnosis not present

## 2016-01-25 DIAGNOSIS — M5136 Other intervertebral disc degeneration, lumbar region: Secondary | ICD-10-CM | POA: Diagnosis not present

## 2016-02-01 NOTE — Telephone Encounter (Signed)
error 

## 2016-02-11 DIAGNOSIS — M5416 Radiculopathy, lumbar region: Secondary | ICD-10-CM | POA: Diagnosis not present

## 2016-02-11 DIAGNOSIS — M4726 Other spondylosis with radiculopathy, lumbar region: Secondary | ICD-10-CM | POA: Diagnosis not present

## 2016-02-11 DIAGNOSIS — M5136 Other intervertebral disc degeneration, lumbar region: Secondary | ICD-10-CM | POA: Diagnosis not present

## 2016-02-29 DIAGNOSIS — M5136 Other intervertebral disc degeneration, lumbar region: Secondary | ICD-10-CM | POA: Diagnosis not present

## 2016-02-29 DIAGNOSIS — M5416 Radiculopathy, lumbar region: Secondary | ICD-10-CM | POA: Diagnosis not present

## 2016-05-08 DIAGNOSIS — M5136 Other intervertebral disc degeneration, lumbar region: Secondary | ICD-10-CM | POA: Diagnosis not present

## 2016-05-08 DIAGNOSIS — M5416 Radiculopathy, lumbar region: Secondary | ICD-10-CM | POA: Diagnosis not present

## 2016-05-14 DIAGNOSIS — E7800 Pure hypercholesterolemia, unspecified: Secondary | ICD-10-CM | POA: Diagnosis not present

## 2016-05-14 DIAGNOSIS — I5022 Chronic systolic (congestive) heart failure: Secondary | ICD-10-CM | POA: Diagnosis not present

## 2016-05-14 DIAGNOSIS — I480 Paroxysmal atrial fibrillation: Secondary | ICD-10-CM | POA: Diagnosis not present

## 2016-05-14 DIAGNOSIS — I429 Cardiomyopathy, unspecified: Secondary | ICD-10-CM | POA: Diagnosis not present

## 2016-05-14 DIAGNOSIS — I10 Essential (primary) hypertension: Secondary | ICD-10-CM | POA: Diagnosis not present

## 2016-05-14 DIAGNOSIS — Z9889 Other specified postprocedural states: Secondary | ICD-10-CM | POA: Diagnosis not present

## 2016-05-14 DIAGNOSIS — I420 Dilated cardiomyopathy: Secondary | ICD-10-CM | POA: Diagnosis not present

## 2016-06-02 ENCOUNTER — Encounter: Payer: Self-pay | Admitting: Family Medicine

## 2016-06-02 ENCOUNTER — Ambulatory Visit (INDEPENDENT_AMBULATORY_CARE_PROVIDER_SITE_OTHER): Payer: PPO | Admitting: Family Medicine

## 2016-06-02 VITALS — BP 104/60 | HR 62 | Temp 97.8°F | Resp 18 | Ht 74.0 in | Wt 271.0 lb

## 2016-06-02 DIAGNOSIS — E785 Hyperlipidemia, unspecified: Secondary | ICD-10-CM

## 2016-06-02 DIAGNOSIS — K219 Gastro-esophageal reflux disease without esophagitis: Secondary | ICD-10-CM | POA: Diagnosis not present

## 2016-06-02 DIAGNOSIS — R609 Edema, unspecified: Secondary | ICD-10-CM

## 2016-06-02 DIAGNOSIS — R739 Hyperglycemia, unspecified: Secondary | ICD-10-CM | POA: Diagnosis not present

## 2016-06-02 DIAGNOSIS — M5126 Other intervertebral disc displacement, lumbar region: Secondary | ICD-10-CM | POA: Insufficient documentation

## 2016-06-02 DIAGNOSIS — Z0000 Encounter for general adult medical examination without abnormal findings: Secondary | ICD-10-CM | POA: Diagnosis not present

## 2016-06-02 DIAGNOSIS — I10 Essential (primary) hypertension: Secondary | ICD-10-CM

## 2016-06-02 DIAGNOSIS — Z1211 Encounter for screening for malignant neoplasm of colon: Secondary | ICD-10-CM

## 2016-06-02 DIAGNOSIS — Z125 Encounter for screening for malignant neoplasm of prostate: Secondary | ICD-10-CM | POA: Diagnosis not present

## 2016-06-02 DIAGNOSIS — E039 Hypothyroidism, unspecified: Secondary | ICD-10-CM | POA: Diagnosis not present

## 2016-06-02 DIAGNOSIS — I420 Dilated cardiomyopathy: Secondary | ICD-10-CM

## 2016-06-02 MED ORDER — FUROSEMIDE 20 MG PO TABS
20.0000 mg | ORAL_TABLET | Freq: Every day | ORAL | 1 refills | Status: DC | PRN
Start: 2016-06-02 — End: 2017-03-18

## 2016-06-02 NOTE — Progress Notes (Signed)
Patient: [name]., Male    DOB: 03/31/1941, 75 y.o.   MRN: [id number] Visit Date: 06/02/2016  Today's Provider: Lelon Huh, MD   Chief Complaint  Patient presents with  . Annual Exam  . Hypertension  . Hyperlipidemia  . Hypothyroidism  . Atrial Fibrillation   Subjective:    Annual physical  [name]. is a 75 y.o. male. He feels poorly. He reports exercising yes. He reports he is sleeping poorly.    Hypertension, follow-up:  BP Readings from Last 3 Encounters:  06/02/16 104/60  07/18/15 (!) 110/50  06/27/15 110/60    He was last seen for hypertension 1 years ago.  BP at that visit was 110/60. Management since that visit includes; no changes. He reports good compliance with treatment. He is not having side effects. none He is exercising. He is adherent to low salt diet.   Outside blood pressures are 110/60. He is experiencing none.  Patient denies none.   Cardiovascular risk factors include none.  Use of agents associated with hypertension: none.     Weight trend: stable Wt Readings from Last 3 Encounters:  06/02/16 271 lb (122.9 kg)  07/18/15 255 lb (115.7 kg)  06/27/15 252 lb (114.3 kg)       Lipid/Cholesterol, Follow-up:   Last seen for this1 years ago.  Management changes since that visit include; no changes. . Last Lipid Panel:    Component Value Date/Time   CHOL 144 05/31/2015 1010   TRIG 187 (H) 05/31/2015 1010   HDL 44 05/31/2015 1010   CHOLHDL 3.3 05/31/2015 1010   LDLCALC 63 05/31/2015 1010    Risk factors for vascular disease include hypertension  He reports good compliance with treatment. He is not having side effects.  Current symptoms include none and have been unchanged. Weight trend: stable Prior visit with dietician: no Current diet: well balanced Current exercise: walking  Wt Readings from Last 3 Encounters:  06/02/16 271 lb (122.9 kg)  07/18/15 255 lb (115.7 kg)  06/27/15 252 lb (114.3  kg)    Hypothyroidism, follow up: Patient was last seen in 05/2015. No changes were made. Patient is currently on levothyroxine 63mcg daily, and reports that he is tolerating medication well.  Lab Results  Component Value Date   TSH 3.830 05/31/2015    Atrial Fib, follow up: Patient was last seen in 05/2015. No changes were made. Patient is currently taking Xarelto 20mg  daily, and reports that he his tolerating medication well.   Complains of low back pain radiating into right hip and has history of large lumbar disk herniation on MRI from [facility] 2017. Is seeing Dr. Sharlet Salina having steroid injections. Also noted swelling in left foot the last month. He is considering seeing neurosurgeon in the near future.   He also has a history of known adenomatous colon polyps. His last completed colonoscopy was in 2010 with findings of multiple adenomas. Colonoscopy was attempted in 2014 but was aborted due to new onset of atrial fibrillation, for which he now followed by Dr. Saralyn Pilar. He has not had any further attempts at colonoscopy.   Review of Systems  Constitutional: Positive for activity change. Negative for chills, fatigue and fever.  HENT: Negative.  Negative for sneezing and sore throat.   Eyes: Negative.  Negative for visual disturbance.  Respiratory: Positive for wheezing. Negative for choking, chest tightness and shortness of breath.   Cardiovascular: Positive for leg swelling.  Gastrointestinal: Negative.  Endocrine: Positive for cold intolerance and heat intolerance.  Genitourinary: Positive for flank pain.  Musculoskeletal: Positive for arthralgias and back pain.  Skin: Negative.   Allergic/Immunologic: Negative.   Neurological: Negative.   Hematological: Negative.   Psychiatric/Behavioral: Positive for sleep disturbance.    Social History   Social History  . Marital status: Widowed    Spouse name: N/A  . Number of children: 2  . Years of education: N/A   Occupational  History  . Retired/ Disabled    Social History Main Topics  . Smoking status: Former Smoker    Packs/day: 2.00    Years: 50.00    Types: Cigarettes    Quit date: 07/01/2007  . Smokeless tobacco: Never Used  . Alcohol use No  . Drug use: No  . Sexual activity: Not on file   Other Topics Concern  . Not on file   Social History Narrative  . No narrative on file    Past Medical History:  Diagnosis Date  . Allergy   . Arthritis   . Atrial fibrillation ([place])   . CHF (congestive heart failure) ([place])   . GERD (gastroesophageal reflux disease)   . History of chicken pox   . Hyperlipidemia   . Hypertension   . Thyroid disease      Patient Active Problem List   Diagnosis Date Noted  . Allergic rhinitis 05/29/2015  . Arthritis 05/29/2015  . GERD (gastroesophageal reflux disease) 05/29/2015  . Hiatal hernia 05/29/2015  . LBP (low back pain) 05/29/2015  . Swelling of limb 05/29/2015  . Varicose veins of lower extremities with inflammation 05/29/2015  . Atrial fibrillation ([place]) 12/19/2014  . Hyperlipidemia 12/19/2014  . Hypothyroidism 12/19/2014  . Hypertension 12/19/2014  . History of cardiac catheterization 02/17/2013  . Congestive cardiomyopathy ([place]) 02/17/2013  . Congestive heart failure, severe ([place]) 10/04/2012    Past Surgical History:  Procedure Laterality Date  . BACK SURGERY     x5  . DOPPLER ECHOCARDIOGRAPHY  06/21/2013   EF=35% while in Sinus Rhythm  . Myocardial perfusion scan  10/2012   severe global LV enlargement. Mild RV enlargement. Mild LVH. Mild mitral and tricuspid insufficency  . polyp excision  09/2005  . UPPER GASTROINTESTINAL ENDOSCOPY  03/27/2004   Gastropathy, Gastritis; Duodenopathy    His family history includes Alzheimer's disease in his mother; Colon cancer in his brother; Heart attack in his father.      Current Outpatient Prescriptions:  .  amiodarone (PACERONE) 200 MG tablet, TAKE 1 TABLET (200 MG TOTAL) BY MOUTH ONCE DAILY.,  Disp: , Rfl: 0 .  cyclobenzaprine (FLEXERIL) 10 MG tablet, Take 1 tablet (10 mg total) by mouth 3 (three) times daily as needed for muscle spasms., Disp: 30 tablet, Rfl: 1 .  esomeprazole (NEXIUM 24HR) 20 MG capsule, Take 1 tablet by mouth daily as needed., Disp: , Rfl:  .  fluticasone (FLONASE) 50 MCG/ACT nasal spray, Place 2 sprays into both nostrils at bedtime., Disp: , Rfl:  .  levothyroxine (SYNTHROID, LEVOTHROID) 75 MCG tablet, Take 1 tablet (75 mcg total) by mouth daily., Disp: 90 tablet, Rfl: 3 .  lisinopril-hydrochlorothiazide (PRINZIDE,ZESTORETIC) 10-12.5 MG tablet, Take 1 tablet by mouth daily., Disp: 90 tablet, Rfl: 3 .  lovastatin (MEVACOR) 20 MG tablet, Take 1 tablet (20 mg total) by mouth daily., Disp: 90 tablet, Rfl: 3 .  metoprolol succinate (TOPROL-XL) 25 MG 24 hr tablet, 1 TABLET BY MOUTH ONCE A DAY, Disp: , Rfl: 0 .  salsalate (DISALCID) 500 MG  tablet, Take 2 tablets by mouth 3 (three) times daily. Reported on 07/18/2015, Disp: , Rfl:  .  XARELTO 20 MG TABS tablet, TAKE 1 TABLET BY MOUTH EVERY DAY, Disp: 30 tablet, Rfl: 12   Patient Care Team: Birdie Sons, MD as PCP - General (Family Medicine) Algernon Huxley, MD as Referring Physician (Vascular Surgery) Isaias Cowman, MD as Consulting Physician (Cardiology) Sharlet Salina, MD as Referring Physician (Physical Medicine and Rehabilitation)     Objective:   Vitals: BP 104/60 (BP Location: Right Arm, Patient Position: Sitting, Cuff Size: Large)   Pulse 62   Temp 97.8 F (36.6 C) (Oral)   Resp 18   Ht 6\' 2"  (1.88 m)   Wt 271 lb (122.9 kg)   SpO2 99%   BMI 34.79 kg/m   Physical Exam   General Appearance:    Alert, cooperative, no distress, appears stated age, overweight  Head:    Normocephalic, without obvious abnormality, atraumatic  Eyes:    PERRL, conjunctiva/corneas clear, EOM's intact, fundi    benign, both eyes       Ears:    Normal TM's and external ear canals, both ears  Nose:   Nares normal,  septum midline, mucosa normal, no drainage   or sinus tenderness  Throat:   Lips, mucosa, and tongue normal; teeth and gums normal  Neck:   Supple, symmetrical, trachea midline, no adenopathy;       thyroid:  No enlargement/tenderness/nodules; no carotid   bruit or JVD  Back:     Symmetric, no curvature, ROM normal, no CVA tenderness  Lungs:     Clear to auscultation bilaterally, respirations unlabored  Chest wall:    No tenderness or deformity  Heart:    Regular rate and rhythm, S1 and S2 normal, no murmur, rub   or gallop  Abdomen:     Soft, non-tender, bowel sounds active all four quadrants,    no masses, no organomegaly  Genitalia:    deferred  Rectal:    deferred  Extremities:   Extremities normal, atraumatic, no cyanosis. 2+ bipedal and ankle edema.   Pulses:   2+ and symmetric all extremities  Skin:   Skin color, texture, turgor normal, no rashes or lesions  Lymph nodes:   Cervical, supraclavicular, and axillary nodes normal  Neurologic:   CNII-XII intact. Normal strength, sensation and reflexes      throughout    Activities of Daily Living In your present state of health, do you have any difficulty performing the following activities: 06/02/2016  Hearing? N  Vision? N  Difficulty concentrating or making decisions? N  Walking or climbing stairs? Y  Dressing or bathing? N  Doing errands, shopping? N  Some recent data might be hidden    Fall Risk Assessment Fall Risk  06/02/2016 05/31/2015  Falls in the past year? No No     Depression Screen PHQ 2/9 Scores 06/02/2016 05/31/2015  PHQ - 2 Score 0 0  PHQ- 9 Score 0 0    Cognitive Testing - 6-CIT  Correct? Score   What year is it? yes 0 0 or 4  What month is it? yes 0 0 or 3  Memorize:    Pia Mau,  44,  [place],      What time is it? (within 1 hour) yes 0 0 or 3  Count backwards from 20 yes 0 0, 2, or 4  Name the months of the year yes 0 0, 2, or  4  Repeat name & address above yes 0 0, 2, 4, 6, 8, or  10       TOTAL SCORE  0/28   Interpretation:  Normal  Normal (0-7) Abnormal (8-28)    Audit-C Alcohol Use Screening  Question Answer Points  How often do you have alcoholic drink? never 0  On days you do drink alcohol, how many drinks do you typically consume? 0 0  How oftey will you drink 6 or more in a total? never 0  Total Score:  0   A score of 3 or more in women, and 4 or more in men indicates increased risk for alcohol abuse, EXCEPT if all of the points are from question 1.     Assessment & Plan:    Annual Physical Reviewed patient's Family Medical History Reviewed and updated list of patient's medical providers Assessment of cognitive impairment was done Assessed patient's functional ability Established a written schedule for health screening [place] Completed and Reviewed  Exercise Activities and Dietary recommendations Goals    None      Immunization History  Administered Date(s) Administered  . Influenza, High Dose Seasonal PF 06/04/2015  . Pneumococcal Conjugate-13 05/23/2014  . Pneumococcal Polysaccharide-23 06/04/2015  . Tdap 04/21/2014    Health Maintenance  Topic Date Due  . ZOSTAVAX  07/27/2002  . INFLUENZA VACCINE  01/29/2016  . COLONOSCOPY  06/21/2019  . TETANUS/TDAP  04/21/2024  . PNA vac Low Risk Adult  Completed     Discussed health benefits of physical activity, and encouraged him to engage in regular exercise appropriate for his age and condition.    ------------------------------------------------------------------------------------------------------------ 1. Annual physical exam He states he has already had flu vaccine at [facility]   2. Hyperlipidemia, unspecified hyperlipidemia type He is tolerating lovastatin well with no adverse effects.   - Lipid panel - Hepatic function panel  3. Hypothyroidism, unspecified type Stable on current thyroid replacement.  - T4 AND TSH  4. Essential hypertension Well  controlled.  Continue current medications.   - Renal function panel  5. Congestive cardiomyopathy (HCC) Stable. Continue routine follow up with Dr. Saralyn Pilar  6. Gastroesophageal reflux disease, esophagitis presence not specified Well controlled on esomeprazole.   7. Lumbar herniated disc Continues regular follow up with Dr. Sharlet Salina. Considering neurosurgery referral.  8. Prostate cancer screening  - PSA  9. Colon cancer screening Extensive discussion regarding recommend colonoscopy schedules. Is does not wat colonoscopy at this time due to underlying cardiac conditions, but counseled that colonoscopy is much lower risk that surgery  Which may be required if he allows polyp to progress to tumor. He does agree to to have Cologuard and will discuss risk of colonoscopy with Dr. Saralyn Pilar if Cologuard is positive.  - Cologuard  10. Hyperglycemia Last gluocose was 111 - Hemoglobin A1c  11. Edema, unspecified type Is been increasingly bothersome for him May take furosemide occasionally, but advised not to take every day unless he develops DOE.  - furosemide (LASIX) 20 MG tablet; Take 1 tablet (20 mg total) by mouth daily as needed for fluid.  Dispense: 30 tablet; Refill: 1  The entirety of the information documented in the History of Present Illness, Review of Systems and Physical Exam were personally obtained by me. Portions of this information were initially documented by Wilburt Finlay, CMA and reviewed by me for thoroughness and accuracy.     Lelon Huh, MD  [facility]

## 2016-06-03 LAB — HEPATIC FUNCTION PANEL
ALT: 21 IU/L (ref 0–44)
AST: 25 IU/L (ref 0–40)
Alkaline Phosphatase: 68 IU/L (ref 39–117)
Bilirubin Total: 0.9 mg/dL (ref 0.0–1.2)
Bilirubin, Direct: 0.2 mg/dL (ref 0.00–0.40)
Total Protein: 6.9 g/dL (ref 6.0–8.5)

## 2016-06-03 LAB — LIPID PANEL
Chol/HDL Ratio: 2.8 ratio (ref 0.0–5.0)
Cholesterol, Total: 142 mg/dL (ref 100–199)
HDL: 50 mg/dL
LDL Calculated: 61 mg/dL (ref 0–99)
Triglycerides: 154 mg/dL — ABNORMAL HIGH (ref 0–149)
VLDL Cholesterol Cal: 31 mg/dL (ref 5–40)

## 2016-06-03 LAB — RENAL FUNCTION PANEL
Albumin: 4.6 g/dL (ref 3.5–4.8)
BUN/Creatinine Ratio: 11 (ref 10–24)
BUN: 17 mg/dL (ref 8–27)
CO2: 22 mmol/L (ref 18–29)
Calcium: 9.3 mg/dL (ref 8.6–10.2)
Chloride: 99 mmol/L (ref 96–106)
Creatinine, Ser: 1.61 mg/dL — ABNORMAL HIGH (ref 0.76–1.27)
GFR calc Af Amer: 48 mL/min/1.73 — ABNORMAL LOW
GFR calc non Af Amer: 42 mL/min/1.73 — ABNORMAL LOW
Glucose: 98 mg/dL (ref 65–99)
Phosphorus: 2.9 mg/dL (ref 2.5–4.5)
Potassium: 4.6 mmol/L (ref 3.5–5.2)
Sodium: 139 mmol/L (ref 134–144)

## 2016-06-03 LAB — PSA: PROSTATE SPECIFIC AG, SERUM: 1.8 ng/mL (ref 0.0–4.0)

## 2016-06-03 LAB — HEMOGLOBIN A1C
Est. average glucose Bld gHb Est-mCnc: 120 mg/dL
Hgb A1c MFr Bld: 5.8 % — ABNORMAL HIGH (ref 4.8–5.6)

## 2016-06-03 LAB — T4 AND TSH
T4, Total: 9 ug/dL (ref 4.5–12.0)
TSH: 4.34 u[IU]/mL (ref 0.450–4.500)

## 2016-06-06 ENCOUNTER — Telehealth: Payer: Self-pay | Admitting: Family Medicine

## 2016-06-06 NOTE — Telephone Encounter (Signed)
Order for cologuard faxed to [facility] °

## 2016-06-24 ENCOUNTER — Telehealth: Payer: Self-pay | Admitting: Family Medicine

## 2016-06-24 NOTE — Telephone Encounter (Signed)
Pt called saying insurance needs Prior Auth. For the colon guard that was ordered by Dr. Caryn Section.    Insurance said they will not pay unless we call with the PA.  The patient was in earlier this month for a CPE.[name]  Pt's call back is [phone]  Thanks, Con Memos

## 2016-06-25 NOTE — Telephone Encounter (Signed)
Called [employer] and they do cover cologuard test for pt. Patient advised. sd

## 2016-06-25 NOTE — Telephone Encounter (Signed)
Please advise 

## 2016-06-25 NOTE — Telephone Encounter (Signed)
I have no idea what they need [place] to do about this. Can call his insurance or call colo-guard and see if there was a problem with order.

## 2016-08-05 ENCOUNTER — Other Ambulatory Visit: Payer: Self-pay | Admitting: Family Medicine

## 2016-08-05 DIAGNOSIS — I10 Essential (primary) hypertension: Secondary | ICD-10-CM

## 2016-08-05 DIAGNOSIS — E785 Hyperlipidemia, unspecified: Secondary | ICD-10-CM

## 2016-08-27 ENCOUNTER — Other Ambulatory Visit: Payer: Self-pay | Admitting: Family Medicine

## 2016-08-27 DIAGNOSIS — E039 Hypothyroidism, unspecified: Secondary | ICD-10-CM

## 2016-08-27 NOTE — Telephone Encounter (Signed)
Patient called about getting his Levothyroxine medication refilled. Patient is out of the medication and did not realize it needed more refills-aa

## 2016-09-11 DIAGNOSIS — R0602 Shortness of breath: Secondary | ICD-10-CM | POA: Diagnosis not present

## 2016-09-11 DIAGNOSIS — I10 Essential (primary) hypertension: Secondary | ICD-10-CM | POA: Diagnosis not present

## 2016-09-11 DIAGNOSIS — I420 Dilated cardiomyopathy: Secondary | ICD-10-CM | POA: Diagnosis not present

## 2016-09-11 DIAGNOSIS — I5022 Chronic systolic (congestive) heart failure: Secondary | ICD-10-CM | POA: Diagnosis not present

## 2016-09-11 DIAGNOSIS — I480 Paroxysmal atrial fibrillation: Secondary | ICD-10-CM | POA: Diagnosis not present

## 2016-09-11 DIAGNOSIS — Z5181 Encounter for therapeutic drug level monitoring: Secondary | ICD-10-CM | POA: Diagnosis not present

## 2016-09-11 DIAGNOSIS — Z9889 Other specified postprocedural states: Secondary | ICD-10-CM | POA: Diagnosis not present

## 2016-09-11 DIAGNOSIS — Z79899 Other long term (current) drug therapy: Secondary | ICD-10-CM | POA: Diagnosis not present

## 2016-09-11 DIAGNOSIS — E784 Other hyperlipidemia: Secondary | ICD-10-CM | POA: Diagnosis not present

## 2016-09-18 DIAGNOSIS — M5416 Radiculopathy, lumbar region: Secondary | ICD-10-CM | POA: Diagnosis not present

## 2016-09-18 DIAGNOSIS — M48062 Spinal stenosis, lumbar region with neurogenic claudication: Secondary | ICD-10-CM | POA: Diagnosis not present

## 2016-09-18 DIAGNOSIS — M5136 Other intervertebral disc degeneration, lumbar region: Secondary | ICD-10-CM | POA: Diagnosis not present

## 2016-10-20 ENCOUNTER — Emergency Department
Admission: EM | Admit: 2016-10-20 | Discharge: 2016-10-20 | Disposition: A | Discharge status: Home / Self Care | Payer: PPO | Attending: Emergency Medicine | Admitting: Emergency Medicine

## 2016-10-20 ENCOUNTER — Encounter: Payer: Self-pay | Admitting: Emergency Medicine

## 2016-10-20 DIAGNOSIS — I509 Heart failure, unspecified: Secondary | ICD-10-CM | POA: Diagnosis not present

## 2016-10-20 DIAGNOSIS — E039 Hypothyroidism, unspecified: Secondary | ICD-10-CM | POA: Diagnosis not present

## 2016-10-20 DIAGNOSIS — Z87891 Personal history of nicotine dependence: Secondary | ICD-10-CM | POA: Insufficient documentation

## 2016-10-20 DIAGNOSIS — I110 Hypertensive heart disease with heart failure: Secondary | ICD-10-CM | POA: Insufficient documentation

## 2016-10-20 DIAGNOSIS — E860 Dehydration: Secondary | ICD-10-CM | POA: Diagnosis not present

## 2016-10-20 DIAGNOSIS — J36 Peritonsillar abscess: Secondary | ICD-10-CM | POA: Insufficient documentation

## 2016-10-20 DIAGNOSIS — Z79899 Other long term (current) drug therapy: Secondary | ICD-10-CM | POA: Diagnosis not present

## 2016-10-20 DIAGNOSIS — J029 Acute pharyngitis, unspecified: Secondary | ICD-10-CM | POA: Diagnosis not present

## 2016-10-20 LAB — COMPREHENSIVE METABOLIC PANEL
ALK PHOS: 63 U/L (ref 38–126)
ALT: 21 U/L (ref 17–63)
ANION GAP: 8 (ref 5–15)
AST: 22 U/L (ref 15–41)
Albumin: 4.1 g/dL (ref 3.5–5.0)
BUN: 30 mg/dL — ABNORMAL HIGH (ref 6–20)
CALCIUM: 9.5 mg/dL (ref 8.9–10.3)
CO2: 27 mmol/L (ref 22–32)
Chloride: 102 mmol/L (ref 101–111)
Creatinine, Ser: 2.08 mg/dL — ABNORMAL HIGH (ref 0.61–1.24)
GFR calc Af Amer: 34 mL/min — ABNORMAL LOW (ref >60)
GFR, EST NON AFRICAN AMERICAN: 30 mL/min — AB (ref >60)
Glucose, Bld: 118 mg/dL — ABNORMAL HIGH (ref 65–99)
Potassium: 4.8 mmol/L (ref 3.5–5.1)
SODIUM: 137 mmol/L (ref 135–145)
TOTAL PROTEIN: 7.4 g/dL (ref 6.5–8.1)
Total Bilirubin: 1.6 mg/dL — ABNORMAL HIGH (ref 0.3–1.2)

## 2016-10-20 LAB — CBC
HCT: 37.6 % — ABNORMAL LOW (ref 40.0–52.0)
HEMOGLOBIN: 12.6 g/dL — AB (ref 13.0–18.0)
MCH: 30.5 pg (ref 26.0–34.0)
MCHC: 33.5 g/dL (ref 32.0–36.0)
MCV: 90.9 fL (ref 80.0–100.0)
Platelets: 218 10*3/uL (ref 150–440)
RBC: 4.13 MIL/uL — ABNORMAL LOW (ref 4.40–5.90)
RDW: 15.5 % — ABNORMAL HIGH (ref 11.5–14.5)
WBC: 9.1 10*3/uL (ref 3.8–10.6)

## 2016-10-20 MED ORDER — SODIUM CHLORIDE 0.9 % IV BOLUS (SEPSIS)
1000.0000 mL | Freq: Once | INTRAVENOUS | Status: AC
  Administered 2016-10-20: 1000 mL via INTRAVENOUS

## 2016-10-20 MED ORDER — DEXAMETHASONE SODIUM PHOSPHATE 10 MG/ML IJ SOLN
INTRAMUSCULAR | Status: AC
  Filled 2016-10-20: qty 1

## 2016-10-20 MED ORDER — AMPICILLIN-SULBACTAM SODIUM 3 (2-1) G IJ SOLR
INTRAMUSCULAR | Status: AC
  Filled 2016-10-20: qty 3

## 2016-10-20 MED ORDER — DEXAMETHASONE SODIUM PHOSPHATE 10 MG/ML IJ SOLN
10.0000 mg | Freq: Once | INTRAMUSCULAR | Status: AC
  Administered 2016-10-20: 10 mg via INTRAVENOUS

## 2016-10-20 MED ORDER — SODIUM CHLORIDE 0.9 % IV SOLN
3.0000 g | Freq: Once | INTRAVENOUS | Status: AC
  Administered 2016-10-20: 3 g via INTRAVENOUS
  Filled 2016-10-20: qty 3

## 2016-10-20 MED ORDER — BENZOCAINE 20 % MT SOLN
Freq: Once | OROMUCOSAL | Status: AC
  Administered 2016-10-20: 13:00:00 via OROMUCOSAL

## 2016-10-20 MED ORDER — BENZOCAINE 20 % MT SOLN
OROMUCOSAL | Status: AC
  Filled 2016-10-20: qty 5

## 2016-10-20 MED ORDER — AMOXICILLIN-POT CLAVULANATE 875-125 MG PO TABS: 1.0000 | ORAL_TABLET | Freq: Two times a day (BID) | ORAL | 0 refills | Status: AC

## 2016-10-20 NOTE — ED Provider Notes (Signed)
[facility] Emergency Department Provider Note  ____________________________________________  Time seen: Approximately 9:31 AM  I have reviewed the triage vital signs and the nursing notes.   HISTORY  Chief Complaint Sore Throat   HPI [name] JOLLIFF Sr. is a 76 y.o. male with a history of atrial fibrillation on Xarelto, CHF, hypertension, hyperlipidemia who presents for evaluation of sore throat. Patient has had a sore throat for 4 days, moderate to severe pain, located in the left side of his throat, associated with difficulty eating and swallowing for the last 2 days. No fever, no meningeal signs. Patient also endorses difficulty fully opening his mouth. He is able to handle his saliva. He denies shortness of breath. Last Xarelto was 24 hours ago. No prior h/o of PTA.   Past Medical History:  Diagnosis Date  . Allergy   . History of chicken pox     Patient Active Problem List   Diagnosis Date Noted  . Lumbar herniated disc 06/02/2016  . Allergic rhinitis 05/29/2015  . Arthritis 05/29/2015  . GERD (gastroesophageal reflux disease) 05/29/2015  . Hiatal hernia 05/29/2015  . LBP (low back pain) 05/29/2015  . Swelling of limb 05/29/2015  . Varicose veins of lower extremities with inflammation 05/29/2015  . Atrial fibrillation ([place]) 12/19/2014  . Hyperlipidemia 12/19/2014  . Hypothyroidism 12/19/2014  . Hypertension 12/19/2014  . History of cardiac catheterization 02/17/2013  . Congestive cardiomyopathy ([place]) 02/17/2013  . Congestive heart failure, severe ([place]) 10/04/2012    Past Surgical History:  Procedure Laterality Date  . BACK SURGERY     x5  . DOPPLER ECHOCARDIOGRAPHY  06/21/2013   EF=35% while in Sinus Rhythm  . Myocardial perfusion scan  10/2012   severe global LV enlargement. Mild RV enlargement. Mild LVH. Mild mitral and tricuspid insufficency  . polyp excision  09/2005  . UPPER GASTROINTESTINAL ENDOSCOPY  03/27/2004   Gastropathy, Gastritis; Duodenopathy    Prior to Admission medications   Medication Sig Start Date End Date Taking? Authorizing Provider  amiodarone (PACERONE) 200 MG tablet TAKE 1 TABLET (200 MG TOTAL) BY MOUTH ONCE DAILY. 10/31/14  Yes Historical Provider, MD  esomeprazole (NEXIUM 24HR) 20 MG capsule Take 1 tablet by mouth daily as needed. 05/23/14  Yes Historical Provider, MD  fluticasone (FLONASE) 50 MCG/ACT nasal spray Place 2 sprays into both nostrils at bedtime. 01/04/13  Yes Historical Provider, MD  furosemide (LASIX) 20 MG tablet Take 1 tablet (20 mg total) by mouth daily as needed for fluid. 06/02/16  Yes Birdie Sons, MD  levothyroxine (SYNTHROID, LEVOTHROID) 75 MCG tablet TAKE 1 TABLET BY MOUTH EVERY DAY 08/27/16  Yes Birdie Sons, MD  lisinopril-hydrochlorothiazide (PRINZIDE,ZESTORETIC) 10-12.5 MG tablet TAKE 1 TABLET BY MOUTH EVERY DAY 08/05/16  Yes Birdie Sons, MD  lovastatin (MEVACOR) 20 MG tablet TAKE 1 TABLET BY MOUTH EVERY DAY 08/05/16  Yes Birdie Sons, MD  metoprolol succinate (TOPROL-XL) 25 MG 24 hr tablet 1 TABLET BY MOUTH ONCE A DAY 10/28/14  Yes Historical Provider, MD  XARELTO 20 MG TABS tablet TAKE 1 TABLET BY MOUTH EVERY DAY 11/08/15  Yes Birdie Sons, MD  amoxicillin-clavulanate (AUGMENTIN) 875-125 MG tablet Take 1 tablet by mouth 2 (two) times daily. 10/20/16 10/30/16  Rudene Re, MD  cyclobenzaprine (FLEXERIL) 10 MG tablet Take 1 tablet (10 mg total) by mouth 3 (three) times daily as needed for muscle spasms. Patient not taking: Reported on 10/20/2016 06/27/15   Birdie Sons, MD    Allergies  Patient has no known allergies.  Family History  Problem Relation Age of Onset  . Alzheimer's disease Mother   . Heart attack Father   . Colon cancer Brother     Social History Social History  Substance Use Topics  . Smoking status: Former Smoker    Packs/day: 2.00    Years: 50.00    Types: Cigarettes    Quit date: 07/01/2007  . Smokeless tobacco: Never  Used  . Alcohol use No    Review of Systems  Constitutional: Negative for fever. Eyes: Negative for visual changes. ENT: + sore throat. Neck: No neck pain  Cardiovascular: Negative for chest pain. Respiratory: Negative for shortness of breath. Gastrointestinal: Negative for abdominal pain, vomiting or diarrhea. Genitourinary: Negative for dysuria. Musculoskeletal: Negative for back pain. Skin: Negative for rash. Neurological: Negative for headaches, weakness or numbness. Psych: No SI or HI  ____________________________________________   PHYSICAL EXAM:  VITAL SIGNS: ED Triage Vitals  Enc Vitals Group     BP 10/20/16 0857 (!) 87/47     Pulse Rate 10/20/16 0857 72     Resp 10/20/16 0857 18     Temp 10/20/16 0857 98.4 F (36.9 C)     Temp Source 10/20/16 0857 Oral     SpO2 10/20/16 0857 97 %     Weight 10/20/16 0857 251 lb (113.9 kg)     Height 10/20/16 0857 6\' 2"  (1.88 m)     Head Circumference --      Peak Flow --      Pain Score 10/20/16 0902 10     Pain Loc --      Pain Edu? --      Excl. in [place]? --     Constitutional: Alert and oriented. Well appearing and in no apparent distress. HEENT:      Head: Normocephalic and atraumatic.         Eyes: Conjunctivae are normal. Sclera is non-icteric. EOMI. PERRL      Mouth/Throat: Mucous membranes are moist. Left peritonsillar abscess with deviation of the uvula to the right, mild overlying cellulitis.  Mild trismus      Neck: Supple with no signs of meningismus. Cardiovascular: Regular rate and rhythm. No murmurs, gallops, or rubs. 2+ symmetrical distal pulses are present in all extremities. No JVD. Respiratory: Normal respiratory effort. Lungs are clear to auscultation bilaterally. No wheezes, crackles, or rhonchi.  Musculoskeletal: Nontender with normal range of motion in all extremities. No edema, cyanosis, or erythema of extremities. Neurologic: Normal speech and language. Face is symmetric. Moving all extremities. No  gross focal neurologic deficits are appreciated. Skin: Skin is warm, dry and intact. No rash noted. Psychiatric: Mood and affect are normal. Speech and behavior are normal.  ____________________________________________   LABS (all labs ordered are listed, but only abnormal results are displayed)  Labs Reviewed  CBC - Abnormal; Notable for the following:       Result Value   RBC 4.13 (*)    Hemoglobin 12.6 (*)    HCT 37.6 (*)    RDW 15.5 (*)    All other components within normal limits  COMPREHENSIVE METABOLIC PANEL - Abnormal; Notable for the following:    Glucose, Bld 118 (*)    BUN 30 (*)    Creatinine, Ser 2.08 (*)    Total Bilirubin 1.6 (*)    GFR calc non Af Amer 30 (*)    GFR calc Af Amer 34 (*)    All other components within normal limits  AEROBIC/ANAEROBIC CULTURE (SURGICAL/DEEP WOUND)   ____________________________________________  EKG  none  ____________________________________________  RADIOLOGY  none  ____________________________________________   PROCEDURES  Procedure(s) performed: None Procedures Critical Care performed:  None ____________________________________________   INITIAL IMPRESSION / ASSESSMENT AND PLAN / ED COURSE  76 y.o. male with a history of atrial fibrillation on Xarelto, CHF, hypertension, hyperlipidemia who presents for evaluation of sore throat. Patient has left peritonsillar abscess with uvula deviation and small amount of trismus. Handling his saliva, no respiratory distress. Airway is patent with no stridor. Normal vital signs. I discussed with Dr. Ladene Artist, ENT who will evaluate patient in the ED. Patient will be given IV decadron and Unasyn.   Clinical Course as of Oct 20 1532  Mon Oct 20, 2016  1237 PTA drained by Dr. Kathyrn Sheriff, ENT with no complications. Patient received IVF for AKI in the setting of 2 days with no eating or drinking. Patient feels markedly improved, tolerating PO. Will dc home on augmentin and f/u with ENT.    [CV]    Clinical Course User Index [CV] Rudene Re, MD    Pertinent labs & imaging results that were available during my care of the patient were reviewed by me and considered in my medical decision making (see chart for details).    ____________________________________________   FINAL CLINICAL IMPRESSION(S) / ED DIAGNOSES  Final diagnoses:  Abscess, peritonsillar  Dehydration      NEW MEDICATIONS STARTED DURING THIS VISIT:  Discharge Medication List as of 10/20/2016 12:40 PM    START taking these medications   Details  amoxicillin-clavulanate (AUGMENTIN) 875-125 MG tablet Take 1 tablet by mouth 2 (two) times daily., Starting Mon 10/20/2016, Until Thu 10/30/2016, Print         Note:  This document was prepared using Dragon voice recognition software and may include unintentional dictation errors.    Rudene Re, MD 10/20/16 1534

## 2016-10-20 NOTE — ED Notes (Signed)
ENT at bedside draining PTA. Consent obtained.

## 2016-10-20 NOTE — Consult Note (Signed)
[name], States [id number] 1941/04/26 Rudene Re, MD  Reason for Consult: Left peritonsillar abscess  HPI: The patient is a 76 year old white male who has had tonsil problems on and off for most of his life. Last time was a couple years ago where he had some pain but no obvious abscess. He has used Augmentin and done very well with that each time it flares up. He had sore throat starting 4 days ago on his left side. Presents with more difficulty swallowing and increasing pain. He is noted in the ER to have a probable peritonsillar abscess on the left side. No CT scan was done. He is seen for possible aspiration to see if we can drain the abscess.  Allergies: No Known Allergies  ROS: Review of systems normal other than 12 systems except per HPI.  PMH:  Past Medical History:  Diagnosis Date  . Allergy   . History of chicken pox     FH:  Family History  Problem Relation Age of Onset  . Alzheimer's disease Mother   . Heart attack Father   . Colon cancer Brother     SH:  Social History   Social History  . Marital status: Widowed    Spouse name: N/A  . Number of children: 2  . Years of education: N/A   Occupational History  . Retired/ Disabled    Social History Main Topics  . Smoking status: Former Smoker    Packs/day: 2.00    Years: 50.00    Types: Cigarettes    Quit date: 07/01/2007  . Smokeless tobacco: Never Used  . Alcohol use No  . Drug use: No  . Sexual activity: Not on file   Other Topics Concern  . Not on file   Social History Narrative  . No narrative on file    PSH:  Past Surgical History:  Procedure Laterality Date  . BACK SURGERY     x5  . DOPPLER ECHOCARDIOGRAPHY  06/21/2013   EF=35% while in Sinus Rhythm  . Myocardial perfusion scan  10/2012   severe global LV enlargement. Mild RV enlargement. Mild LVH. Mild mitral and tricuspid insufficency  . polyp excision  09/2005  . UPPER GASTROINTESTINAL ENDOSCOPY  03/27/2004   Gastropathy,  Gastritis; Duodenopathy    Physical  Exam: Well-developed white male in no acute distress but discomfort with swallowing. CN 2-12 grossly intact and symmetric. Oral cavity, lips, gums normal with no masses or lesions. He is edentulous. His uvula is swollen watery with redness in his left palate above his tonsil. The tonsil itself does not have any exudate or redness or swelling. Skin warm and dry. External nose and ears without masses or lesions. Neck supple with no masses or lesions. No lymphadenopathy palpated.   After informed consent was signed the throat was sprayed with Cetacaine spray. An 18-gauge needle was used for aspirating the palate area above the tonsil. Proximally 2-1/2 ml of chocolate brown pus was removed. Second aspiration removed a couple more drops. He tolerated this very well without any unusual discomfort.   A/P: Patient had a left peritonsillar abscess which was at the superior pole only. This is now been aspirated. He is been given Unasyn and Decadron here in the emergency room and can go home on Augmentin and some of pain medication. If his symptoms completely resolve he does not need to return for follow-up, but if he continues to have problems he should come to the office for reevaluation.   Tiyana Galla H 10/20/2016  11:23 AM

## 2016-10-20 NOTE — ED Notes (Signed)
ENT consult at bedside. 

## 2016-10-20 NOTE — ED Notes (Signed)
Moved to hall. NAD. Alert and oriented. Feels some better since PTA drained by ENT

## 2016-10-20 NOTE — ED Triage Notes (Signed)
Pt reports left sided throat pain with inflammation and swelling, difficulty swallowing. Pt's airway intact, secretions controlled at this time. Pt denies fever at home. Pt reports being on xarelto at home, has been using throat spray and salt water rinses with no relief.

## 2016-10-25 LAB — AEROBIC/ANAEROBIC CULTURE (SURGICAL/DEEP WOUND)

## 2016-10-25 LAB — AEROBIC/ANAEROBIC CULTURE W GRAM STAIN (SURGICAL/DEEP WOUND): Culture: NORMAL

## 2016-10-27 DIAGNOSIS — Z79899 Other long term (current) drug therapy: Secondary | ICD-10-CM | POA: Diagnosis not present

## 2016-11-30 ENCOUNTER — Other Ambulatory Visit: Payer: Self-pay | Admitting: Family Medicine

## 2017-03-18 ENCOUNTER — Ambulatory Visit (INDEPENDENT_AMBULATORY_CARE_PROVIDER_SITE_OTHER): Payer: PPO | Admitting: Family Medicine

## 2017-03-18 ENCOUNTER — Encounter: Payer: Self-pay | Admitting: Family Medicine

## 2017-03-18 ENCOUNTER — Encounter: Payer: Self-pay | Admitting: *Deleted

## 2017-03-18 VITALS — BP 118/60 | HR 60 | Temp 98.4°F | Resp 16 | Ht 74.0 in | Wt 251.0 lb

## 2017-03-18 DIAGNOSIS — R7303 Prediabetes: Secondary | ICD-10-CM

## 2017-03-18 DIAGNOSIS — I10 Essential (primary) hypertension: Secondary | ICD-10-CM

## 2017-03-18 DIAGNOSIS — I4891 Unspecified atrial fibrillation: Secondary | ICD-10-CM

## 2017-03-18 DIAGNOSIS — Z23 Encounter for immunization: Secondary | ICD-10-CM

## 2017-03-18 DIAGNOSIS — Z6832 Body mass index (BMI) 32.0-32.9, adult: Secondary | ICD-10-CM | POA: Diagnosis not present

## 2017-03-18 DIAGNOSIS — E785 Hyperlipidemia, unspecified: Secondary | ICD-10-CM

## 2017-03-18 DIAGNOSIS — E039 Hypothyroidism, unspecified: Secondary | ICD-10-CM

## 2017-03-18 DIAGNOSIS — R2231 Localized swelling, mass and lump, right upper limb: Secondary | ICD-10-CM | POA: Diagnosis not present

## 2017-03-18 NOTE — Progress Notes (Signed)
Patient: [name] TUDISCO Sr. Male    DOB: 1941-03-12   76 y.o.   MRN: [id number] Visit Date: 03/18/2017  Today's Provider: Lelon Huh, MD   Chief Complaint  Patient presents with  . Hyperglycemia    follow up  . Hypothyroidism    follow up  . Hypertension    follow up   Subjective:    HPI  Hypertension, follow-up:  BP Readings from Last 3 Encounters:  10/20/16 (!) 115/59  06/02/16 104/60  07/18/15 (!) 110/50    He was last seen for hypertension 9 months ago.  BP at that visit was 104/60. Management since that visit includes no changes. He reports good compliance with treatment. He is not having side effects.  He is not exercising. He is adherent to low salt diet.   Outside blood pressures are checked occasionally at the pharmacy. He is experiencing none.  Patient denies chest pain, chest pressure/discomfort, claudication, dyspnea, exertional chest pressure/discomfort, fatigue, irregular heart beat, lower extremity edema, near-syncope, orthopnea, palpitations, paroxysmal nocturnal dyspnea, syncope and tachypnea.   Cardiovascular risk factors include advanced age (older than 73 for men, 23 for women), hypertension and male gender.  Use of agents associated with hypertension: thyroid hormones.     Weight trend: stable Wt Readings from Last 3 Encounters:  10/20/16 251 lb (113.9 kg)  06/02/16 271 lb (122.9 kg)  07/18/15 255 lb (115.7 kg)    Current diet: well balanced  ------------------------------------------------------------------------ Follow up Hypothyroidism:  Patient was last seen for this problem 9 months ago and no changes were made. Patient reports good compliance with treatment, and good tolerance, with TSH of 4.340.    Hyperglycemia, Follow-up:   Lab Results  Component Value Date   HGBA1C 5.8 (H) 06/02/2016   GLUCOSE 118 (H) 10/20/2016   GLUCOSE 98 06/02/2016   GLUCOSE 111 (H) 05/31/2015    Last seen for for this 9 months ago.    Management since then includes counseling patient to avoid sweets and saturated fats in his diet. Current symptoms include none and have been stable.  Weight trend: stable Prior visit with dietician: no Current diet: well balanced Current exercise: none  Pertinent Labs:    Component Value Date/Time   CHOL 142 06/02/2016 1017   TRIG 154 (H) 06/02/2016 1017   CHOLHDL 2.8 06/02/2016 1017   CREATININE 2.08 (H) 10/20/2016 0901    Wt Readings from Last 3 Encounters:  10/20/16 251 lb (113.9 kg)  06/02/16 271 lb (122.9 kg)  07/18/15 255 lb (115.7 kg)    He also reports a bump under his right upper arm for a few months. Is not painful, but does itch.     No Known Allergies   Current Outpatient Prescriptions:  .  amiodarone (PACERONE) 200 MG tablet, TAKE 1 TABLET (200 MG TOTAL) BY MOUTH ONCE DAILY., Disp: , Rfl: 0 .  levothyroxine (SYNTHROID, LEVOTHROID) 75 MCG tablet, TAKE 1 TABLET BY MOUTH EVERY DAY, Disp: 90 tablet, Rfl: 3 .  lisinopril-hydrochlorothiazide (PRINZIDE,ZESTORETIC) 10-12.5 MG tablet, TAKE 1 TABLET BY MOUTH EVERY DAY, Disp: 90 tablet, Rfl: 4 .  lovastatin (MEVACOR) 20 MG tablet, TAKE 1 TABLET BY MOUTH EVERY DAY, Disp: 90 tablet, Rfl: 4 .  metoprolol succinate (TOPROL-XL) 25 MG 24 hr tablet, 1 TABLET BY MOUTH ONCE A DAY, Disp: , Rfl: 0 .  XARELTO 20 MG TABS tablet, TAKE 1 TABLET BY MOUTH EVERY DAY, Disp: 30 tablet, Rfl: 12  Review of Systems  Constitutional: Negative  for appetite change, chills and fever.  Respiratory: Negative for chest tightness, shortness of breath and wheezing.   Cardiovascular: Negative for chest pain and palpitations.  Gastrointestinal: Negative for abdominal pain, nausea and vomiting.  Endocrine: Positive for cold intolerance and heat intolerance.  Skin:       Itchy Lump on upper right arm    Social History  Substance Use Topics  . Smoking status: Former Smoker    Packs/day: 2.00    Years: 50.00    Types: Cigarettes    Quit date:  07/01/2007  . Smokeless tobacco: Never Used  . Alcohol use No   Objective:   BP 118/60 (BP Location: Left Arm, Patient Position: Sitting, Cuff Size: Large)   Pulse 60   Temp 98.4 F (36.9 C) (Oral)   Resp 16   Ht 6\' 2"  (1.88 m)   Wt 251 lb (113.9 kg)   SpO2 98% Comment: room air  BMI 32.23 kg/m     Physical Exam   General Appearance:    Alert, cooperative, no distress  Eyes:    PERRL, conjunctiva/corneas clear, EOM's intact       Lungs:     Clear to auscultation bilaterally, respirations unlabored  Heart:    Regular rate and rhythm  Neurologic:   Awake, alert, oriented x 3. No apparent focal neurological           defect.   Skin:   Hard, non-tender, irregular grape size subcutaneous mass underside of right upper arm, with discoloration of overlying skin.        Assessment & Plan:     1. Essential hypertension Well controlled.  Continue current medications.    2. Hypothyroidism, unspecified type  - TSH - Specimen ID Notification 2nd ID Missing  3. Atrial fibrillation, unspecified type ([place]) Rate controlled, tolerating NOAC well with no abnormal bleeding.  - CBC  4. Pre-diabetes  - Hemoglobin A1c  5. Mass of arm, right Not typical lipoma or sebaceous cyst. Will have surgery evaluate.  - Ambulatory referral to General Surgery  6. Need for influenza vaccination  - Flu vaccine HIGH DOSE PF (Fluzone High dose)  7. BMI 32.0-32.9,adult Encourage prudent diet and exercise.   8. Hyperlipidemia, unspecified hyperlipidemia type He is tolerating lovastatin well with no adverse effects.   - Lipid panel - COMPLETE METABOLIC PANEL WITH GFR       Lelon Huh, MD  [facility]

## 2017-03-19 DIAGNOSIS — E785 Hyperlipidemia, unspecified: Secondary | ICD-10-CM | POA: Diagnosis not present

## 2017-03-19 DIAGNOSIS — R7303 Prediabetes: Secondary | ICD-10-CM | POA: Diagnosis not present

## 2017-03-19 DIAGNOSIS — I4891 Unspecified atrial fibrillation: Secondary | ICD-10-CM | POA: Diagnosis not present

## 2017-03-19 DIAGNOSIS — E039 Hypothyroidism, unspecified: Secondary | ICD-10-CM | POA: Diagnosis not present

## 2017-03-20 LAB — COMPLETE METABOLIC PANEL WITH GFR
AG Ratio: 1.6 (calc) (ref 1.0–2.5)
ALT: 19 U/L (ref 9–46)
AST: 17 U/L (ref 10–35)
Albumin: 4 g/dL (ref 3.6–5.1)
Alkaline phosphatase (APISO): 57 U/L (ref 40–115)
BUN / CREAT RATIO: 15 (calc) (ref 6–22)
BUN: 25 mg/dL (ref 7–25)
CALCIUM: 9.2 mg/dL (ref 8.6–10.3)
CHLORIDE: 105 mmol/L (ref 98–110)
CO2: 26 mmol/L (ref 20–32)
CREATININE: 1.71 mg/dL — AB (ref 0.70–1.18)
GFR, Est African American: 45 mL/min/{1.73_m2} — ABNORMAL LOW (ref >=60)
GFR, Est Non African American: 39 mL/min/{1.73_m2} — ABNORMAL LOW (ref >=60)
GLUCOSE: 101 mg/dL — AB (ref 65–99)
Globulin: 2.5 g/dL (calc) (ref 1.9–3.7)
Potassium: 4.4 mmol/L (ref 3.5–5.3)
Sodium: 138 mmol/L (ref 135–146)
TOTAL PROTEIN: 6.5 g/dL (ref 6.1–8.1)
Total Bilirubin: 0.9 mg/dL (ref 0.2–1.2)

## 2017-03-20 LAB — HEMOGLOBIN A1C
EAG (MMOL/L): 6 (calc)
Hgb A1c MFr Bld: 5.4 % of total Hgb (ref <5.7)
MEAN PLASMA GLUCOSE: 108 (calc)

## 2017-03-20 LAB — CBC
HCT: 37.6 % — ABNORMAL LOW (ref 38.5–50.0)
HEMOGLOBIN: 12.6 g/dL — AB (ref 13.2–17.1)
MCH: 30.8 pg (ref 27.0–33.0)
MCHC: 33.5 g/dL (ref 32.0–36.0)
MCV: 91.9 fL (ref 80.0–100.0)
MPV: 11.4 fL (ref 7.5–12.5)
Platelets: 197 10*3/uL (ref 140–400)
RBC: 4.09 10*6/uL — AB (ref 4.20–5.80)
RDW: 14.1 % (ref 11.0–15.0)
WBC: 6.4 10*3/uL (ref 3.8–10.8)

## 2017-03-20 LAB — SPECIMEN ID NOTIFICATION MISSING 2ND ID

## 2017-03-20 LAB — LIPID PANEL
Cholesterol: 132 mg/dL (ref <200)
HDL: 46 mg/dL (ref >40)
LDL Cholesterol (Calc): 66 mg/dL (calc)
NON-HDL CHOLESTEROL (CALC): 86 mg/dL (ref <130)
Total CHOL/HDL Ratio: 2.9 (calc) (ref <5.0)
Triglycerides: 114 mg/dL (ref <150)

## 2017-03-20 LAB — TSH: TSH: 6.78 m[IU]/L — AB (ref 0.40–4.50)

## 2017-03-23 DIAGNOSIS — E784 Other hyperlipidemia: Secondary | ICD-10-CM | POA: Diagnosis not present

## 2017-03-23 DIAGNOSIS — Z9889 Other specified postprocedural states: Secondary | ICD-10-CM | POA: Diagnosis not present

## 2017-03-23 DIAGNOSIS — I480 Paroxysmal atrial fibrillation: Secondary | ICD-10-CM | POA: Diagnosis not present

## 2017-03-23 DIAGNOSIS — I10 Essential (primary) hypertension: Secondary | ICD-10-CM | POA: Diagnosis not present

## 2017-03-23 DIAGNOSIS — R0602 Shortness of breath: Secondary | ICD-10-CM | POA: Diagnosis not present

## 2017-03-23 DIAGNOSIS — I5022 Chronic systolic (congestive) heart failure: Secondary | ICD-10-CM | POA: Diagnosis not present

## 2017-03-23 DIAGNOSIS — I420 Dilated cardiomyopathy: Secondary | ICD-10-CM | POA: Diagnosis not present

## 2017-03-30 ENCOUNTER — Encounter: Payer: Self-pay | Admitting: General Surgery

## 2017-03-30 ENCOUNTER — Ambulatory Visit (INDEPENDENT_AMBULATORY_CARE_PROVIDER_SITE_OTHER): Payer: PPO | Admitting: General Surgery

## 2017-03-30 VITALS — BP 130/82 | HR 79 | Resp 18 | Ht 73.0 in | Wt 250.0 lb

## 2017-03-30 DIAGNOSIS — L723 Sebaceous cyst: Secondary | ICD-10-CM | POA: Diagnosis not present

## 2017-03-30 NOTE — Patient Instructions (Addendum)
Patient to return in one month. Possible excision.  The patient is aware to call back for any questions or concerns. The patient is aware to use a heating pad as needed for comfort.

## 2017-03-30 NOTE — Progress Notes (Signed)
Patient ID: [name] SAWA Sr., male   DOB: 1940-10-19, 76 y.o.   MRN: [id number]  Chief Complaint  Patient presents with  . Lipoma    HPI [name] SITAR Sr. is a 76 y.o. male here today for a evaluation of a right forearm lipoma. Patient states he noticed this about two months ago. He states the area has not got bigger, no pain but itchy.  HPI  Past Medical History:  Diagnosis Date  . Allergy   . History of chicken pox     Past Surgical History:  Procedure Laterality Date  . BACK SURGERY     x5  . DOPPLER ECHOCARDIOGRAPHY  06/21/2013   EF=35% while in Sinus Rhythm  . Myocardial perfusion scan  10/2012   severe global LV enlargement. Mild RV enlargement. Mild LVH. Mild mitral and tricuspid insufficency  . polyp excision  09/2005  . UPPER GASTROINTESTINAL ENDOSCOPY  03/27/2004   Gastropathy, Gastritis; Duodenopathy    Family History  Problem Relation Age of Onset  . Alzheimer's disease Mother   . Heart attack Father   . Colon cancer Brother     Social History Social History  Substance Use Topics  . Smoking status: Former Smoker    Packs/day: 2.00    Years: 50.00    Types: Cigarettes    Quit date: 07/01/2007  . Smokeless tobacco: Never Used  . Alcohol use No    No Known Allergies  Current Outpatient Prescriptions  Medication Sig Dispense Refill  . amiodarone (PACERONE) 200 MG tablet TAKE 1 TABLET (200 MG TOTAL) BY MOUTH ONCE DAILY.  0  . levothyroxine (SYNTHROID, LEVOTHROID) 75 MCG tablet TAKE 1 TABLET BY MOUTH EVERY DAY 90 tablet 3  . lisinopril-hydrochlorothiazide (PRINZIDE,ZESTORETIC) 10-12.5 MG tablet TAKE 1 TABLET BY MOUTH EVERY DAY 90 tablet 4  . lovastatin (MEVACOR) 20 MG tablet TAKE 1 TABLET BY MOUTH EVERY DAY 90 tablet 4  . metoprolol succinate (TOPROL-XL) 25 MG 24 hr tablet 1 TABLET BY MOUTH ONCE A DAY  0  . XARELTO 20 MG TABS tablet TAKE 1 TABLET BY MOUTH EVERY DAY 30 tablet 12   No current facility-administered medications for this visit.      Review of Systems Review of Systems  Constitutional: Negative.   Respiratory: Negative.   Cardiovascular: Negative.     Blood pressure 130/82, pulse 79, resp. rate 18, height 6\' 1"  (1.854 m), weight 250 lb (113.4 kg).  Physical Exam Physical Exam  Constitutional: He is oriented to person, place, and time. He appears well-developed and well-nourished.  Cardiovascular: Normal rate, regular rhythm and normal heart sounds.   Pulmonary/Chest: Effort normal and breath sounds normal.  Musculoskeletal:       Arms: Lymphadenopathy:    He has no axillary adenopathy.  Neurological: He is alert and oriented to person, place, and time.  Skin: Skin is warm and dry.       Data Reviewed Laboratory studies dated 03/19/2017 showed a hemoglobin of 12.6 with an MCV of 91.9, white plus a count 6400. Comprehensive metabolic panel of the same date notable for a creatinine of 1.71 with an SP GFR of 39. Normal electrolytes.  Assessment    Likely inflamed rather infected sebaceous cyst involving the left upper arm, unlikely lipoma.    Plan    The patient's presently asymptomatic. I've asked to make use of local heat to the area twice a day for the next month. If it resolves, no intervention would be required. If it persist would  plan for excision.  The patient does not need to stop Xarelto if we proceed to surgical excision.     Patient to return in one month.    The patient is aware to call back for any questions or concerns.   HPI, Physical Exam, Assessment and Plan have been scribed under the direction and in the presence of Hervey Ard, MD.  Gaspar Cola, CMA  I have completed the exam and reviewed the above documentation for accuracy and completeness.  I agree with the above.  Haematologist has been used and any errors in dictation or transcription are unintentional.  Hervey Ard, M.D., F.A.C.S. Robert Bellow 03/30/2017, 9:17 PM

## 2017-04-09 DIAGNOSIS — M5136 Other intervertebral disc degeneration, lumbar region: Secondary | ICD-10-CM | POA: Diagnosis not present

## 2017-04-09 DIAGNOSIS — M48062 Spinal stenosis, lumbar region with neurogenic claudication: Secondary | ICD-10-CM | POA: Diagnosis not present

## 2017-04-09 DIAGNOSIS — M5416 Radiculopathy, lumbar region: Secondary | ICD-10-CM | POA: Diagnosis not present

## 2017-05-05 ENCOUNTER — Encounter: Payer: Self-pay | Admitting: General Surgery

## 2017-05-05 ENCOUNTER — Ambulatory Visit (INDEPENDENT_AMBULATORY_CARE_PROVIDER_SITE_OTHER): Payer: PPO | Admitting: General Surgery

## 2017-05-05 VITALS — BP 136/78 | HR 68 | Resp 16 | Ht 74.0 in | Wt 255.0 lb

## 2017-05-05 DIAGNOSIS — L723 Sebaceous cyst: Secondary | ICD-10-CM

## 2017-05-05 NOTE — Progress Notes (Signed)
Patient ID: [name] COBBINS Sr., male   DOB: 1941-06-15, 76 y.o.   MRN: [id number]  Chief Complaint  Patient presents with  . Follow-up    HPI [name] SPANGLER Sr. is a 76 y.o. male.  Here for follow up sebaceous cyst right upper posterior arm. He states the area is the same even after using the heat. No pain but it itches.  HPI  Past Medical History:  Diagnosis Date  . Allergy   . History of chicken pox     Past Surgical History:  Procedure Laterality Date  . BACK SURGERY     x5  . DOPPLER ECHOCARDIOGRAPHY  06/21/2013   EF=35% while in Sinus Rhythm  . Myocardial perfusion scan  10/2012   severe global LV enlargement. Mild RV enlargement. Mild LVH. Mild mitral and tricuspid insufficency  . polyp excision  09/2005  . UPPER GASTROINTESTINAL ENDOSCOPY  03/27/2004   Gastropathy, Gastritis; Duodenopathy    Family History  Problem Relation Age of Onset  . Alzheimer's disease Mother   . Heart attack Father   . Colon cancer Brother     Social History Social History   Tobacco Use  . Smoking status: Former Smoker    Packs/day: 2.00    Years: 50.00    Pack years: 100.00    Types: Cigarettes    Last attempt to quit: 07/01/2007    Years since quitting: 9.8  . Smokeless tobacco: Never Used  Substance Use Topics  . Alcohol use: No    Alcohol/week: 0.0 oz  . Drug use: No    No Known Allergies  Current Outpatient Medications  Medication Sig Dispense Refill  . amiodarone (PACERONE) 200 MG tablet TAKE 1 TABLET (200 MG TOTAL) BY MOUTH ONCE DAILY.  0  . levothyroxine (SYNTHROID, LEVOTHROID) 75 MCG tablet TAKE 1 TABLET BY MOUTH EVERY DAY 90 tablet 3  . lisinopril-hydrochlorothiazide (PRINZIDE,ZESTORETIC) 10-12.5 MG tablet TAKE 1 TABLET BY MOUTH EVERY DAY 90 tablet 4  . lovastatin (MEVACOR) 20 MG tablet TAKE 1 TABLET BY MOUTH EVERY DAY 90 tablet 4  . metoprolol succinate (TOPROL-XL) 25 MG 24 hr tablet 1 TABLET BY MOUTH ONCE A DAY  0  . XARELTO 20 MG TABS tablet TAKE 1 TABLET BY  MOUTH EVERY DAY 30 tablet 12   No current facility-administered medications for this visit.     Review of Systems Review of Systems  Constitutional: Negative.   Respiratory: Negative.   Cardiovascular: Negative.     Blood pressure 136/78, pulse 68, resp. rate 16, height 6\' 2"  (1.88 m), weight 255 lb (115.7 kg).  Physical Exam Physical Exam  Constitutional: He is oriented to person, place, and time. He appears well-developed and well-nourished.  Neurological: He is alert and oriented to person, place, and time.  Skin: Skin is warm and dry.  Skin cyst posterior right upper arm  Psychiatric: His behavior is normal.       Assessment    Stable, noninfected sebaceous cyst involving the right upper arm.    Plan    Patient is really asymptomatic, and the cyst is not interfering with activities.  If it becomes troublesome we can certainly request authorization for excision, otherwise would simply observe.    Follow up as needed or if symptoms worsen.  The patient is aware to call back for any questions or new concerns.   HPI, Physical Exam, Assessment and Plan have been scribed under the direction and in the presence of Robert Bellow, MD. Karie Fetch,  RN  I have completed the exam and reviewed the above documentation for accuracy and completeness.  I agree with the above.  Haematologist has been used and any errors in dictation or transcription are unintentional.  Hervey Ard, M.D., F.A.C.S.  Robert Bellow 05/05/2017, 10:01 AM

## 2017-05-05 NOTE — Patient Instructions (Addendum)
The patient is aware to call back for any questions or new concerns. Follow up as needed or if symptoms worsen.

## 2017-07-07 ENCOUNTER — Ambulatory Visit (INDEPENDENT_AMBULATORY_CARE_PROVIDER_SITE_OTHER): Payer: PPO

## 2017-07-07 VITALS — BP 134/60 | HR 64 | Temp 97.4°F | Ht 74.0 in | Wt 265.0 lb

## 2017-07-07 DIAGNOSIS — Z0000 Encounter for general adult medical examination without abnormal findings: Secondary | ICD-10-CM

## 2017-07-07 NOTE — Patient Instructions (Signed)
[name] , Thank you for taking time to come for your Medicare Wellness Visit. I appreciate your ongoing commitment to your health goals. Please review the following plan we discussed and let me know if I can assist you in the future.   Screening recommendations/referrals: Colonoscopy: Up to date Recommended yearly ophthalmology/optometry visit for glaucoma screening and checkup Recommended yearly dental visit for hygiene and checkup  Vaccinations: Influenza vaccine: Up to date Pneumococcal vaccine: Up to date Tdap vaccine: Up to date Shingles vaccine: Pt declines today.   Advanced directives: Already on file  Conditions/risks identified: Obesity- recommend to decrease or completely cut out all extra sugars in diet.   Next appointment: 09/17/16  Preventive Care 75 Years and Older, Male Preventive care refers to lifestyle choices and visits with your health care provider that can promote health and wellness. What does preventive care include?  A yearly physical exam. This is also called an annual well check.  Dental exams once or twice a year.  Routine eye exams. Ask your health care provider how often you should have your eyes checked.  Personal lifestyle choices, including:  Daily care of your teeth and gums.  Regular physical activity.  Eating a healthy diet.  Avoiding tobacco and drug use.  Limiting alcohol use.  Practicing safe sex.  Taking low doses of aspirin every day.  Taking vitamin and mineral supplements as recommended by your health care provider. What happens during an annual well check? The services and screenings done by your health care provider during your annual well check will depend on your age, overall health, lifestyle risk factors, and family history of disease. Counseling  Your health care provider may ask you questions about your:  Alcohol use.  Tobacco use.  Drug use.  Emotional well-being.  Home and relationship  well-being.  Sexual activity.  Eating habits.  History of falls.  Memory and ability to understand (cognition).  Work and work Statistician. Screening  You may have the following tests or measurements:  Height, weight, and BMI.  Blood pressure.  Lipid and cholesterol levels. These may be checked every 5 years, or more frequently if you are over 70 years old.  Skin check.  Lung cancer screening. You may have this screening every year starting at age 75 if you have a 30-pack-year history of smoking and currently smoke or have quit within the past 15 years.  Fecal occult blood test (FOBT) of the stool. You may have this test every year starting at age 75.  Flexible sigmoidoscopy or colonoscopy. You may have a sigmoidoscopy every 5 years or a colonoscopy every 10 years starting at age 75.  Prostate cancer screening. Recommendations will vary depending on your family history and other risks.  Hepatitis C blood test.  Hepatitis B blood test.  Sexually transmitted disease (STD) testing.  Diabetes screening. This is done by checking your blood sugar (glucose) after you have not eaten for a while (fasting). You may have this done every 1-3 years.  Abdominal aortic aneurysm (AAA) screening. You may need this if you are a current or former smoker.  Osteoporosis. You may be screened starting at age 55 if you are at high risk. Talk with your health care provider about your test results, treatment options, and if necessary, the need for more tests. Vaccines  Your health care provider may recommend certain vaccines, such as:  Influenza vaccine. This is recommended every year.  Tetanus, diphtheria, and acellular pertussis (Tdap, Td) vaccine. You may need  a Td booster every 10 years.  Zoster vaccine. You may need this after age 75.  Pneumococcal 13-valent conjugate (PCV13) vaccine. One dose is recommended after age 75.  Pneumococcal polysaccharide (PPSV23) vaccine. One dose is  recommended after age 75. Talk to your health care provider about which screenings and vaccines you need and how often you need them. This information is not intended to replace advice given to you by your health care provider. Make sure you discuss any questions you have with your health care provider. Document Released: 07/13/2015 Document Revised: 03/05/2016 Document Reviewed: 04/17/2015 Elsevier Interactive Patient Education  2017 [place] Prevention in the Home Falls can cause injuries. They can happen to people of all ages. There are many things you can do to make your home safe and to help prevent falls. What can I do on the outside of my home?  Regularly fix the edges of walkways and driveways and fix any cracks.  Remove anything that might make you trip as you walk through a door, such as a raised step or threshold.  Trim any bushes or trees on the path to your home.  Use bright outdoor lighting.  Clear any walking paths of anything that might make someone trip, such as rocks or tools.  Regularly check to see if handrails are loose or broken. Make sure that both sides of any steps have handrails.  Any raised decks and porches should have guardrails on the edges.  Have any leaves, snow, or ice cleared regularly.  Use sand or salt on walking paths during winter.  Clean up any spills in your garage right away. This includes oil or grease spills. What can I do in the bathroom?  Use night lights.  Install grab bars by the toilet and in the tub and shower. Do not use towel bars as grab bars.  Use non-skid mats or decals in the tub or shower.  If you need to sit down in the shower, use a plastic, non-slip stool.  Keep the floor dry. Clean up any water that spills on the floor as soon as it happens.  Remove soap buildup in the tub or shower regularly.  Attach bath mats securely with double-sided non-slip rug tape.  Do not have throw rugs and other things on  the floor that can make you trip. What can I do in the bedroom?  Use night lights.  Make sure that you have a light by your bed that is easy to reach.  Do not use any sheets or blankets that are too big for your bed. They should not hang down onto the floor.  Have a firm chair that has side arms. You can use this for support while you get dressed.  Do not have throw rugs and other things on the floor that can make you trip. What can I do in the kitchen?  Clean up any spills right away.  Avoid walking on wet floors.  Keep items that you use a lot in easy-to-reach places.  If you need to reach something above you, use a strong step stool that has a grab bar.  Keep electrical cords out of the way.  Do not use floor polish or wax that makes floors slippery. If you must use wax, use non-skid floor wax.  Do not have throw rugs and other things on the floor that can make you trip. What can I do with my stairs?  Do not leave any items on the  stairs.  Make sure that there are handrails on both sides of the stairs and use them. Fix handrails that are broken or loose. Make sure that handrails are as long as the stairways.  Check any carpeting to make sure that it is firmly attached to the stairs. Fix any carpet that is loose or worn.  Avoid having throw rugs at the top or bottom of the stairs. If you do have throw rugs, attach them to the floor with carpet tape.  Make sure that you have a light switch at the top of the stairs and the bottom of the stairs. If you do not have them, ask someone to add them for you. What else can I do to help prevent falls?  Wear shoes that:  Do not have high heels.  Have rubber bottoms.  Are comfortable and fit you well.  Are closed at the toe. Do not wear sandals.  If you use a stepladder:  Make sure that it is fully opened. Do not climb a closed stepladder.  Make sure that both sides of the stepladder are locked into place.  Ask someone to  hold it for you, if possible.  Clearly mark and make sure that you can see:  Any grab bars or handrails.  First and last steps.  Where the edge of each step is.  Use tools that help you move around (mobility aids) if they are needed. These include:  Canes.  Walkers.  Scooters.  Crutches.  Turn on the lights when you go into a dark area. Replace any light bulbs as soon as they burn out.  Set up your furniture so you have a clear path. Avoid moving your furniture around.  If any of your floors are uneven, fix them.  If there are any pets around you, be aware of where they are.  Review your medicines with your doctor. Some medicines can make you feel dizzy. This can increase your chance of falling. Ask your doctor what other things that you can do to help prevent falls. This information is not intended to replace advice given to you by your health care provider. Make sure you discuss any questions you have with your health care provider. Document Released: 04/12/2009 Document Revised: 11/22/2015 Document Reviewed: 07/21/2014 Elsevier Interactive Patient Education  2017 [employer].

## 2017-07-07 NOTE — Progress Notes (Signed)
Subjective:   [name] BORDAS Sr. is a 77 y.o. male who presents for Medicare Annual/Subsequent preventive examination.  Review of Systems:  N/A  Cardiac Risk Factors include: male gender;dyslipidemia;hypertension;obesity (BMI >30kg/m2);advanced age (>3men, >8 women)     Objective:    Vitals: BP 134/60 (BP Location: Left Arm)   Pulse 64   Temp (!) 97.4 F (36.3 C) (Oral)   Ht 6\' 2"  (1.88 m)   Wt 265 lb (120.2 kg)   BMI 34.02 kg/m   Body mass index is 34.02 kg/m.  Advanced Directives 07/07/2017 10/20/2016  Does Patient Have a Medical Advance Directive? Yes No  Type of Paramedic of [place];Living will;Out of facility DNR (pink MOST or yellow form) -  Copy of [place] in Chart? Yes -  Would patient like information on creating a medical advance directive? - No - Patient declined    Tobacco Social History   Tobacco Use  Smoking Status Former Smoker  . Packs/day: 2.00  . Years: 50.00  . Pack years: 100.00  . Types: Cigarettes  . Last attempt to quit: 07/01/2007  . Years since quitting: 10.0  Smokeless Tobacco Never Used     Counseling given: Not Answered   Clinical Intake:  Pre-visit preparation completed: Yes  Pain : 0-10 Pain Score: 2  Pain Type: Chronic pain Pain Location: Back Pain Orientation: Lower Pain Descriptors / Indicators: Sore Pain Frequency: Intermittent     Nutritional Status: BMI > 30  Obese Nutritional Risks: None Diabetes: No  How often do you need to have someone help you when you read instructions, pamphlets, or other written materials from your doctor or pharmacy?: 1 - Never  Interpreter Needed?: No  Information entered by :: [facility], LPN  Past Medical History:  Diagnosis Date  . Allergy   . History of chicken pox   . Hyperlipidemia   . Hypertension    Past Surgical History:  Procedure Laterality Date  . BACK SURGERY     x5  . DOPPLER ECHOCARDIOGRAPHY  06/21/2013   EF=35%  while in Sinus Rhythm  . Myocardial perfusion scan  10/2012   severe global LV enlargement. Mild RV enlargement. Mild LVH. Mild mitral and tricuspid insufficency  . polyp excision  09/2005  . UPPER GASTROINTESTINAL ENDOSCOPY  03/27/2004   Gastropathy, Gastritis; Duodenopathy   Family History  Problem Relation Age of Onset  . Alzheimer's disease Mother   . Heart attack Father   . Colon cancer Brother    Social History   Socioeconomic History  . Marital status: Widowed    Spouse name: None  . Number of children: 2  . Years of education: None  . Highest education level: 12th grade  Social Needs  . Financial resource strain: Not hard at all  . Food insecurity - worry: Never true  . Food insecurity - inability: Never true  . Transportation needs - medical: Yes  . Transportation needs - non-medical: Yes  Occupational History  . Occupation: Retired/ Disabled  Tobacco Use  . Smoking status: Former Smoker    Packs/day: 2.00    Years: 50.00    Pack years: 100.00    Types: Cigarettes    Last attempt to quit: 07/01/2007    Years since quitting: 10.0  . Smokeless tobacco: Never Used  Substance and Sexual Activity  . Alcohol use: No    Alcohol/week: 0.0 oz  . Drug use: No  . Sexual activity: None  Other Topics Concern  .  None  Social History Narrative  . None    Outpatient Encounter Medications as of 07/07/2017  Medication Sig  . amiodarone (PACERONE) 200 MG tablet TAKE 1 TABLET (200 MG TOTAL) BY MOUTH ONCE DAILY.  [name] levothyroxine (SYNTHROID, LEVOTHROID) 75 MCG tablet TAKE 1 TABLET BY MOUTH EVERY DAY  . lisinopril-hydrochlorothiazide (PRINZIDE,ZESTORETIC) 10-12.5 MG tablet TAKE 1 TABLET BY MOUTH EVERY DAY  . lovastatin (MEVACOR) 20 MG tablet TAKE 1 TABLET BY MOUTH EVERY DAY  . metoprolol succinate (TOPROL-XL) 25 MG 24 hr tablet 1 TABLET BY MOUTH ONCE A DAY  . XARELTO 20 MG TABS tablet TAKE 1 TABLET BY MOUTH EVERY DAY   No facility-administered encounter medications on file as  of 07/07/2017.     Activities of Daily Living In your present state of health, do you have any difficulty performing the following activities: 07/07/2017  Hearing? N  Vision? N  Difficulty concentrating or making decisions? N  Walking or climbing stairs? N  Dressing or bathing? N  Doing errands, shopping? N  Preparing Food and eating ? N  Using the Toilet? N  In the past six months, have you accidently leaked urine? N  Do you have problems with loss of bowel control? N  Managing your Medications? N  Managing your Finances? N  Housekeeping or managing your Housekeeping? N  Some recent data might be hidden    Patient Care Team: Birdie Sons, MD as PCP - General (Family Medicine) Isaias Cowman, MD as Consulting Physician (Cardiology) Sharlet Salina, MD as Referring Physician (Physical Medicine and Rehabilitation)   Assessment:   This is a routine wellness examination for [name].  Exercise Activities and Dietary recommendations Current Exercise Habits: Home exercise routine, Type of exercise: walking(slow walking indoors), Time (Minutes): > 60(1-2 hours ), Frequency (Times/Week): 7, Weekly Exercise (Minutes/Week): 0, Intensity: Mild, Exercise limited by: orthopedic condition(s)  Goals    . DIET - REDUCE SUGAR INTAKE     Recommend to decrease or completely cut out all extra sugars in diet.        Fall Risk Fall Risk  07/07/2017 06/02/2016 05/31/2015  Falls in the past year? No No No   Is the patient's home free of loose throw rugs in walkways, pet beds, electrical cords, etc?   yes      Grab bars in the bathroom? yes      Handrails on the stairs?   N/A      Adequate lighting?   yes  Timed Get Up and Go Performed: N/A  Depression Screen PHQ 2/9 Scores 07/07/2017 07/07/2017 06/02/2016 05/31/2015  PHQ - 2 Score 0 0 0 0  PHQ- 9 Score 0 - 0 0    Cognitive Function: Pt declined screening today.       Immunization History  Administered Date(s) Administered  . Influenza  Split 03/22/2012  . Influenza, High Dose Seasonal PF 05/23/2014, 06/04/2015, 03/18/2017  . Pneumococcal Conjugate-13 05/23/2014  . Pneumococcal Polysaccharide-23 06/04/2015  . Tdap 04/21/2014    Qualifies for Shingles Vaccine? Due for Shingles vaccine. Declined my offer to administer today. Education has been provided regarding the importance of this vaccine. Pt has been advised to call her insurance company to determine her out of pocket expense. Advised she may also receive this vaccine at her local pharmacy or Health Dept. Verbalized acceptance and understanding.  Screening Tests Health Maintenance  Topic Date Due  . COLONOSCOPY  06/21/2019  . TETANUS/TDAP  04/21/2024  . INFLUENZA VACCINE  Completed  . PNA vac  Low Risk Adult  Completed   Cancer Screenings: Lung: Low Dose CT Chest recommended if Age 53-80 years, 30 pack-year currently smoking OR have quit w/in 15years. Patient does qualify. Pt declines order today. Colorectal: Up to date  Additional Screenings:  Hepatitis B/HIV/Syphillis: Pt declines today.  Hepatitis C Screening: Pt declines today.     Plan:  I have personally reviewed and addressed the Medicare Annual Wellness questionnaire and have noted the following in the patient's chart:  A. Medical and social history B. Use of alcohol, tobacco or illicit drugs  C. Current medications and supplements D. Functional ability and status E.  Nutritional status F.  Physical activity G. Advance directives H. List of other physicians I.  Hospitalizations, surgeries, and ER visits in previous 12 months J.  [place] such as hearing and vision if needed, cognitive and depression L. Referrals and appointments - none  In addition, I have reviewed and discussed with patient certain preventive protocols, quality metrics, and best practice recommendations. A written personalized care plan for preventive services as well as general preventive health recommendations were  provided to patient.  See attached scanned questionnaire for additional information.   Signed,  Fabio Neighbors, LPN Nurse Health Advisor   Nurse Recommendations: None.

## 2017-07-23 DIAGNOSIS — E785 Hyperlipidemia, unspecified: Secondary | ICD-10-CM | POA: Diagnosis not present

## 2017-07-23 DIAGNOSIS — I5022 Chronic systolic (congestive) heart failure: Secondary | ICD-10-CM | POA: Diagnosis not present

## 2017-07-23 DIAGNOSIS — Z79899 Other long term (current) drug therapy: Secondary | ICD-10-CM | POA: Diagnosis not present

## 2017-07-23 DIAGNOSIS — I420 Dilated cardiomyopathy: Secondary | ICD-10-CM | POA: Diagnosis not present

## 2017-07-23 DIAGNOSIS — R0602 Shortness of breath: Secondary | ICD-10-CM | POA: Diagnosis not present

## 2017-07-23 DIAGNOSIS — I10 Essential (primary) hypertension: Secondary | ICD-10-CM | POA: Diagnosis not present

## 2017-07-23 DIAGNOSIS — I480 Paroxysmal atrial fibrillation: Secondary | ICD-10-CM | POA: Diagnosis not present

## 2017-07-23 DIAGNOSIS — Z9889 Other specified postprocedural states: Secondary | ICD-10-CM | POA: Diagnosis not present

## 2017-07-23 LAB — TSH: TSH: 6.14 — AB (ref <=5.90)

## 2017-07-30 ENCOUNTER — Telehealth: Payer: Self-pay

## 2017-07-30 MED ORDER — LEVOTHYROXINE SODIUM 100 MCG PO TABS: 100.0000 ug | ORAL_TABLET | Freq: Every day | ORAL | 2 refills | Status: DC

## 2017-07-30 NOTE — Telephone Encounter (Signed)
[facility] Cardiology PA Clabe Seal called requesting to speak with either Dr. Caryn Section or his nurse. I spoke with the PA who told me that they have been periodically checking patients labs including a TSH due to him being on Amiodarone. She states the latest lab results showed his TSH results at 6.137, Vicente Males says that the patient is already on Levothyroxine, but they don't make adjustments since they are cardiology. Vicente Males is going to fax a copy of the lab results for Dr. Caryn Section to review.

## 2017-07-30 NOTE — Telephone Encounter (Signed)
Please have patient increase levothyroxine to 16mcg daily, #30, rf x 2 and we will recheck levels at his o.v. In march.

## 2017-07-30 NOTE — Telephone Encounter (Signed)
Patient was advised. Rx sent to pharmacy.

## 2017-07-30 NOTE — Addendum Note (Signed)
Addended by: Julieta Bellini on: 07/30/2017 02:56 PM   Modules accepted: Orders

## 2017-09-16 NOTE — Progress Notes (Signed)
Patient: [name] MCCORMAC Sr. Male    DOB: 1940-10-23   77 y.o.   MRN: [id number] Visit Date: 09/17/2017  Today's Provider: Lelon Huh, MD   Chief Complaint  Patient presents with  . Follow-up  . Hypertension  . Hyperlipidemia  . Hypothyroidism   Subjective:    HPI5.6   Hypertension, follow-up:  BP Readings from Last 3 Encounters:  09/17/17 122/62  07/07/17 134/60  05/05/17 136/78    He was last seen for hypertension 6 months ago.  BP at that visit was 118/60. Management since that visit includes; no changes.He reports good compliance with treatment. He is not having side effects.  He is not exercising. He is adherent to low salt diet.   Outside blood pressures are checked daily. He is experiencing none.  Patient denies exertional chest pressure/discomfort, lower extremity edema and palpitations.   Cardiovascular risk factors include dyslipidemia.     Lipid/Cholesterol, Follow-up:   Last seen for this 6 months ago.  Management since that visit includes; no changes.  Last Lipid Panel:    Component Value Date/Time   CHOL 132 03/19/2017 0813   CHOL 142 06/02/2016 1017   TRIG 114 03/19/2017 0813   HDL 46 03/19/2017 0813   HDL 50 06/02/2016 1017   CHOLHDL 2.9 03/19/2017 0813   LDLCALC 66 03/19/2017 0813    He reports good compliance with treatment. He is not having side effects.   Wt Readings from Last 3 Encounters:  09/17/17 258 lb (117 kg)  07/07/17 265 lb (120.2 kg)  05/05/17 255 lb (115.7 kg)    Hypothyroidism, unspecified type From 03/18/2017-labs checked, no changes.  Lab Results  Component Value Date   TSH 6.14 (A) 07/23/2017     Pre-diabetes From 03/18/2017-no changes. Hemoglobin A1c 5.4. Lab Results  Component Value Date   HGBA1C 5.6 09/17/2017   He states he has been having worsening problems with pain in his knees. Takes occasional tylenol which provides minimal relief.    No Known Allergies   Current Outpatient  Medications:  .  amiodarone (PACERONE) 200 MG tablet, TAKE 1 TABLET (200 MG TOTAL) BY MOUTH ONCE DAILY., Disp: , Rfl: 0 .  levothyroxine (SYNTHROID, LEVOTHROID) 100 MCG tablet, Take 1 tablet (100 mcg total) by mouth daily., Disp: 30 tablet, Rfl: 2 .  lisinopril-hydrochlorothiazide (PRINZIDE,ZESTORETIC) 10-12.5 MG tablet, TAKE 1 TABLET BY MOUTH EVERY DAY, Disp: 90 tablet, Rfl: 4 .  lovastatin (MEVACOR) 20 MG tablet, TAKE 1 TABLET BY MOUTH EVERY DAY, Disp: 90 tablet, Rfl: 4 .  metoprolol succinate (TOPROL-XL) 25 MG 24 hr tablet, 1 TABLET BY MOUTH ONCE A DAY, Disp: , Rfl: 0 .  XARELTO 20 MG TABS tablet, TAKE 1 TABLET BY MOUTH EVERY DAY, Disp: 30 tablet, Rfl: 12  Review of Systems  Constitutional: Negative for appetite change, chills and fever.  Respiratory: Negative for chest tightness, shortness of breath and wheezing.   Cardiovascular: Negative for chest pain and palpitations.  Gastrointestinal: Negative for abdominal pain, nausea and vomiting.    Social History   Tobacco Use  . Smoking status: Former Smoker    Packs/day: 2.00    Years: 50.00    Pack years: 100.00    Types: Cigarettes    Last attempt to quit: 07/01/2007    Years since quitting: 10.2  . Smokeless tobacco: Never Used  Substance Use Topics  . Alcohol use: No    Alcohol/week: 0.0 oz   Objective:   BP 122/62 (BP Location:  Left Arm, Patient Position: Sitting, Cuff Size: Large)   Pulse (!) 58   Temp 98.4 F (36.9 C)   Resp 16   Ht 6\' 2"  (1.88 m)   Wt 258 lb (117 kg)   SpO2 99%   BMI 33.13 kg/m     Physical Exam   General Appearance:    Alert, cooperative, no distress  Eyes:    PERRL, conjunctiva/corneas clear, EOM's intact       Lungs:     Clear to auscultation bilaterally, respirations unlabored  Heart:     Irregularly irregular rhythm. Normal rate   Neurologic:   Awake, alert, oriented x 3. No apparent focal neurological           defect.       Results for orders placed or performed in visit on 09/17/17    POCT glycosylated hemoglobin (Hb A1C)  Result Value Ref Range   Hemoglobin A1C 5.6    Est. average glucose Bld gHb Est-mCnc 114        Assessment & Plan:     1. Pre-diabetes  - POCT glycosylated hemoglobin (Hb A1C)  2. Essential hypertension Well controlled.  Continue current medications.    3. Atrial fibrillation, unspecified type ([place]) Rate well controlled, doing well on NOAC  4. Osteoarthritis of both knees, unspecified osteoarthritis type tyr- traMADol (ULTRAM) 50 MG tablet; Take 1 tablet (50 mg total) by mouth every 8 (eight) hours as needed.  Dispense: 30 tablet; Refill: 5  [place] He is to call to schedule follow up when he returns around October or November.        Lelon Huh, MD  [facility]

## 2017-09-17 ENCOUNTER — Ambulatory Visit (INDEPENDENT_AMBULATORY_CARE_PROVIDER_SITE_OTHER): Payer: PPO | Admitting: Family Medicine

## 2017-09-17 ENCOUNTER — Encounter: Payer: Self-pay | Admitting: Family Medicine

## 2017-09-17 VITALS — BP 122/62 | HR 58 | Temp 98.4°F | Resp 16 | Ht 74.0 in | Wt 258.0 lb

## 2017-09-17 DIAGNOSIS — M170 Bilateral primary osteoarthritis of knee: Secondary | ICD-10-CM

## 2017-09-17 DIAGNOSIS — I4891 Unspecified atrial fibrillation: Secondary | ICD-10-CM | POA: Diagnosis not present

## 2017-09-17 DIAGNOSIS — M179 Osteoarthritis of knee, unspecified: Secondary | ICD-10-CM | POA: Insufficient documentation

## 2017-09-17 DIAGNOSIS — I10 Essential (primary) hypertension: Secondary | ICD-10-CM | POA: Diagnosis not present

## 2017-09-17 DIAGNOSIS — M1710 Unilateral primary osteoarthritis, unspecified knee: Secondary | ICD-10-CM | POA: Insufficient documentation

## 2017-09-17 DIAGNOSIS — R7303 Prediabetes: Secondary | ICD-10-CM | POA: Diagnosis not present

## 2017-09-17 LAB — POCT GLYCOSYLATED HEMOGLOBIN (HGB A1C)
Est. average glucose Bld gHb Est-mCnc: 114
HEMOGLOBIN A1C: 5.6

## 2017-09-17 MED ORDER — TRAMADOL HCL 50 MG PO TABS: 50.0000 mg | ORAL_TABLET | Freq: Three times a day (TID) | ORAL | 5 refills | Status: DC | PRN

## 2017-09-17 NOTE — Patient Instructions (Signed)
   You can take 2 extra strength tylenol twice every day.   You can also take tramadol in addition to tylenol as needed.

## 2017-10-14 DIAGNOSIS — I420 Dilated cardiomyopathy: Secondary | ICD-10-CM | POA: Diagnosis not present

## 2017-10-14 DIAGNOSIS — E785 Hyperlipidemia, unspecified: Secondary | ICD-10-CM | POA: Diagnosis not present

## 2017-10-14 DIAGNOSIS — I480 Paroxysmal atrial fibrillation: Secondary | ICD-10-CM | POA: Diagnosis not present

## 2017-10-14 DIAGNOSIS — I10 Essential (primary) hypertension: Secondary | ICD-10-CM | POA: Diagnosis not present

## 2017-10-14 DIAGNOSIS — R0602 Shortness of breath: Secondary | ICD-10-CM | POA: Diagnosis not present

## 2017-10-14 DIAGNOSIS — I5022 Chronic systolic (congestive) heart failure: Secondary | ICD-10-CM | POA: Diagnosis not present

## 2017-10-19 DIAGNOSIS — E785 Hyperlipidemia, unspecified: Secondary | ICD-10-CM | POA: Diagnosis not present

## 2017-10-19 DIAGNOSIS — R0602 Shortness of breath: Secondary | ICD-10-CM | POA: Diagnosis not present

## 2017-10-19 DIAGNOSIS — I420 Dilated cardiomyopathy: Secondary | ICD-10-CM | POA: Diagnosis not present

## 2017-10-19 DIAGNOSIS — I10 Essential (primary) hypertension: Secondary | ICD-10-CM | POA: Diagnosis not present

## 2017-10-19 DIAGNOSIS — Z9889 Other specified postprocedural states: Secondary | ICD-10-CM | POA: Diagnosis not present

## 2017-10-19 DIAGNOSIS — I480 Paroxysmal atrial fibrillation: Secondary | ICD-10-CM | POA: Diagnosis not present

## 2017-10-19 DIAGNOSIS — I5022 Chronic systolic (congestive) heart failure: Secondary | ICD-10-CM | POA: Diagnosis not present

## 2017-10-23 ENCOUNTER — Other Ambulatory Visit: Payer: Self-pay | Admitting: Family Medicine

## 2017-10-23 DIAGNOSIS — E039 Hypothyroidism, unspecified: Secondary | ICD-10-CM

## 2017-10-26 MED ORDER — LEVOTHYROXINE SODIUM 100 MCG PO TABS: 100.0000 ug | ORAL_TABLET | Freq: Every day | ORAL | 1 refills | Status: DC

## 2018-04-07 ENCOUNTER — Ambulatory Visit (INDEPENDENT_AMBULATORY_CARE_PROVIDER_SITE_OTHER): Payer: PPO | Admitting: Family Medicine

## 2018-04-07 ENCOUNTER — Encounter: Payer: Self-pay | Admitting: Family Medicine

## 2018-04-07 VITALS — BP 106/58 | HR 60 | Temp 97.7°F | Resp 16 | Wt 252.0 lb

## 2018-04-07 DIAGNOSIS — G8929 Other chronic pain: Secondary | ICD-10-CM | POA: Diagnosis not present

## 2018-04-07 DIAGNOSIS — M5126 Other intervertebral disc displacement, lumbar region: Secondary | ICD-10-CM

## 2018-04-07 DIAGNOSIS — R7303 Prediabetes: Secondary | ICD-10-CM | POA: Diagnosis not present

## 2018-04-07 DIAGNOSIS — I10 Essential (primary) hypertension: Secondary | ICD-10-CM | POA: Diagnosis not present

## 2018-04-07 DIAGNOSIS — M545 Low back pain: Secondary | ICD-10-CM

## 2018-04-07 DIAGNOSIS — E039 Hypothyroidism, unspecified: Secondary | ICD-10-CM

## 2018-04-07 DIAGNOSIS — E785 Hyperlipidemia, unspecified: Secondary | ICD-10-CM | POA: Diagnosis not present

## 2018-04-07 LAB — POCT GLYCOSYLATED HEMOGLOBIN (HGB A1C): HEMOGLOBIN A1C: 5.5 % (ref 4.0–5.6)

## 2018-04-07 NOTE — Progress Notes (Signed)
Patient: [name] VANWYHE Sr. Male    DOB: [birth year]   77 y.o.   MRN: [id number] Visit Date: 04/07/2018  Today's Provider: Lelon Huh, MD   Chief Complaint  Patient presents with  . Hyperglycemia  . Hypertension  . Hypothyroidism   Subjective:    Hyperglycemia  This is a chronic problem. The problem has been unchanged. Associated symptoms include arthralgias and joint swelling. Pertinent negatives include no abdominal pain, change in bowel habit, chest pain, fatigue, headaches, myalgias, neck pain, numbness or visual change.  Hypertension  This is a chronic problem. The problem is unchanged. The problem is controlled. Pertinent negatives include no anxiety, blurred vision, chest pain, headaches, malaise/fatigue, neck pain, orthopnea, palpitations or shortness of breath. There are no associated agents to hypertension. There are no compliance problems.  Identifiable causes of hypertension include a thyroid problem.  Thyroid Problem  Presents for follow-up visit. Symptoms include cold intolerance. Patient reports no fatigue, hair loss, heat intolerance, hoarse voice, nail problem, palpitations, visual change, weight gain or weight loss. His past medical history is significant for hyperlipidemia.  Hyperlipidemia  This is a chronic problem. The problem is controlled. Recent lipid tests were reviewed and are normal. Pertinent negatives include no chest pain, myalgias or shortness of breath.  Knee Pain   The pain is present in the left knee. Pertinent negatives include no inability to bear weight, loss of motion, loss of sensation, muscle weakness, numbness or tingling.   Lab Results  Component Value Date   CHOL 132 03/19/2017   CHOL 142 06/02/2016   CHOL 144 05/31/2015   Lab Results  Component Value Date   HDL 46 03/19/2017   HDL 50 06/02/2016   HDL 44 05/31/2015   Lab Results  Component Value Date   LDLCALC 66 03/19/2017   LDLCALC 61 06/02/2016   LDLCALC 63 05/31/2015     Lab Results  Component Value Date   TRIG 114 03/19/2017   TRIG 154 (H) 06/02/2016   TRIG 187 (H) 05/31/2015   Lab Results  Component Value Date   CHOLHDL 2.9 03/19/2017   CHOLHDL 2.8 06/02/2016   CHOLHDL 3.3 05/31/2015   No results found for: LDLDIRECT Lab Results  Component Value Date   HGBA1C 5.6 09/17/2017   BP Readings from Last 3 Encounters:  04/07/18 (!) 106/58  09/17/17 122/62  07/07/17 134/60   Wt Readings from Last 3 Encounters:  04/07/18 252 lb (114.3 kg)  09/17/17 258 lb (117 kg)  07/07/17 265 lb (120.2 kg)    His main complaint today is chronic back pain, had MRI in 2017 showing evere scoliosis degenerative lumbar spondylosis with multilevel disc disease facet disease. He has been getting injections by Dr. Sharlet Salina which haven't helped. He does take occasional tramadol which provides minimal relief. He tried to walk every day, but is very limited by pain.     No Known Allergies   Current Outpatient Medications:  .  amiodarone (PACERONE) 200 MG tablet, TAKE 1 TABLET (200 MG TOTAL) BY MOUTH ONCE DAILY., Disp: , Rfl: 0 .  furosemide (LASIX) 20 MG tablet, Take 20 mg by mouth daily as needed., Disp: , Rfl:  .  levothyroxine (SYNTHROID, LEVOTHROID) 100 MCG tablet, Take 1 tablet (100 mcg total) by mouth daily., Disp: 90 tablet, Rfl: 1 .  lisinopril-hydrochlorothiazide (PRINZIDE,ZESTORETIC) 10-12.5 MG tablet, TAKE 1 TABLET BY MOUTH EVERY DAY, Disp: 90 tablet, Rfl: 4 .  lovastatin (MEVACOR) 20 MG tablet, TAKE 1 TABLET BY  MOUTH EVERY DAY, Disp: 90 tablet, Rfl: 4 .  metoprolol succinate (TOPROL-XL) 25 MG 24 hr tablet, 1 TABLET BY MOUTH ONCE A DAY, Disp: , Rfl: 0 .  traMADol (ULTRAM) 50 MG tablet, Take 1 tablet (50 mg total) by mouth every 8 (eight) hours as needed., Disp: 30 tablet, Rfl: 5 .  XARELTO 20 MG TABS tablet, TAKE 1 TABLET BY MOUTH EVERY DAY, Disp: 30 tablet, Rfl: 12  Review of Systems  Constitutional: Negative.  Negative for fatigue, malaise/fatigue, weight  gain and weight loss.  HENT: Negative for hoarse voice.   Eyes: Negative for blurred vision.  Respiratory: Negative.  Negative for shortness of breath.   Cardiovascular: Positive for leg swelling (Occasionally). Negative for chest pain, palpitations and orthopnea.  Gastrointestinal: Negative.  Negative for abdominal pain and change in bowel habit.  Endocrine: Positive for cold intolerance. Negative for heat intolerance, polydipsia, polyphagia and polyuria.  Musculoskeletal: Positive for arthralgias, back pain, gait problem and joint swelling. Negative for myalgias, neck pain and neck stiffness.  Neurological: Negative for dizziness, tingling, light-headedness, numbness and headaches.    Social History   Tobacco Use  . Smoking status: Former Smoker    Packs/day: 2.00    Years: 50.00    Pack years: 100.00    Types: Cigarettes    Last attempt to quit: 07/01/2007    Years since quitting: 10.7  . Smokeless tobacco: Never Used  Substance Use Topics  . Alcohol use: No    Alcohol/week: 0.0 standard drinks   Objective:   BP (!) 106/58 (BP Location: Right Arm, Patient Position: Sitting, Cuff Size: Large)   Pulse 60   Temp 97.7 F (36.5 C) (Oral)   Resp 16   Wt 252 lb (114.3 kg)   BMI 32.35 kg/m  Vitals:   04/07/18 0826  BP: (!) 106/58  Pulse: 60  Resp: 16  Temp: 97.7 F (36.5 C)  TempSrc: Oral  Weight: 252 lb (114.3 kg)     Physical Exam   General Appearance:    Alert, cooperative, no distress  Eyes:    PERRL, conjunctiva/corneas clear, EOM's intact       Lungs:     Clear to auscultation bilaterally, respirations unlabored  Heart:    Regular rate and rhythm  Neurologic:   Awake, alert, oriented x 3. No apparent focal neurological           defect.       Results for orders placed or performed in visit on 04/07/18  POCT glycosylated hemoglobin (Hb A1C)  Result Value Ref Range   Hemoglobin A1C 5.5 4.0 - 5.6 %       Assessment & Plan:      1. Essential  hypertension Well controlled.  Continue current medications.    2. Hyperlipidemia, unspecified hyperlipidemia type He is tolerating lovastatin well with no adverse effects.   - Comprehensive metabolic panel - Lipid panel  3. Pre-diabetes Diet controlled.  - POCT glycosylated hemoglobin (Hb A1C)  4. Lumbar herniated disc   5. Chronic midline low back pain without sciatica Pain is getting progressively worse and limited daily activities. He is not very good candidate for surgery due to CHF history. - Ambulatory referral to Pain Clinic  6. Hypothyroidism, unspecified type  - TSH       Lelon Huh, MD  [facility]

## 2018-04-07 NOTE — Patient Instructions (Addendum)
   The CDC recommends two doses of Shingrix (the shingles vaccine) separated by 2 to 6 months for adults age 75 years and older. I recommend checking with your insurance plan regarding coverage for this vaccine.    You should do 20-30 minutes of low impact exercise every day, such as walking or pool exercises.

## 2018-04-08 ENCOUNTER — Other Ambulatory Visit: Payer: Self-pay | Admitting: Family Medicine

## 2018-04-08 DIAGNOSIS — R609 Edema, unspecified: Secondary | ICD-10-CM

## 2018-04-08 DIAGNOSIS — M170 Bilateral primary osteoarthritis of knee: Secondary | ICD-10-CM

## 2018-04-08 LAB — COMPREHENSIVE METABOLIC PANEL
A/G RATIO: 1.5 (ref 1.2–2.2)
ALT: 24 IU/L (ref 0–44)
AST: 25 IU/L (ref 0–40)
Albumin: 4 g/dL (ref 3.5–4.8)
Alkaline Phosphatase: 67 IU/L (ref 39–117)
BILIRUBIN TOTAL: 0.8 mg/dL (ref 0.0–1.2)
BUN / CREAT RATIO: 10 (ref 10–24)
BUN: 18 mg/dL (ref 8–27)
CO2: 25 mmol/L (ref 20–29)
Calcium: 9.3 mg/dL (ref 8.6–10.2)
Chloride: 100 mmol/L (ref 96–106)
Creatinine, Ser: 1.8 mg/dL — ABNORMAL HIGH (ref 0.76–1.27)
GFR calc Af Amer: 42 mL/min/{1.73_m2} — ABNORMAL LOW (ref >59)
GFR calc non Af Amer: 36 mL/min/{1.73_m2} — ABNORMAL LOW (ref >59)
GLOBULIN, TOTAL: 2.6 g/dL (ref 1.5–4.5)
Glucose: 103 mg/dL — ABNORMAL HIGH (ref 65–99)
Potassium: 3.2 mmol/L — ABNORMAL LOW (ref 3.5–5.2)
SODIUM: 140 mmol/L (ref 134–144)
Total Protein: 6.6 g/dL (ref 6.0–8.5)

## 2018-04-08 LAB — LIPID PANEL
CHOL/HDL RATIO: 3.5 ratio (ref 0.0–5.0)
Cholesterol, Total: 150 mg/dL (ref 100–199)
HDL: 43 mg/dL (ref >39)
LDL CALC: 72 mg/dL (ref 0–99)
TRIGLYCERIDES: 175 mg/dL — AB (ref 0–149)
VLDL Cholesterol Cal: 35 mg/dL (ref 5–40)

## 2018-04-08 LAB — TSH: TSH: 2.83 u[IU]/mL (ref 0.450–4.500)

## 2018-04-26 ENCOUNTER — Other Ambulatory Visit: Payer: Self-pay | Admitting: Family Medicine

## 2018-04-26 DIAGNOSIS — I10 Essential (primary) hypertension: Secondary | ICD-10-CM

## 2018-05-04 ENCOUNTER — Ambulatory Visit
Payer: PPO | Attending: Student in an Organized Health Care Education/Training Program | Admitting: Student in an Organized Health Care Education/Training Program

## 2018-05-04 ENCOUNTER — Other Ambulatory Visit: Payer: Self-pay

## 2018-05-04 ENCOUNTER — Encounter: Payer: Self-pay | Admitting: Student in an Organized Health Care Education/Training Program

## 2018-05-04 VITALS — BP 134/61 | HR 56 | Temp 98.2°F | Resp 18 | Ht 74.0 in | Wt 254.0 lb

## 2018-05-04 DIAGNOSIS — M545 Low back pain: Secondary | ICD-10-CM | POA: Diagnosis present

## 2018-05-04 DIAGNOSIS — I428 Other cardiomyopathies: Secondary | ICD-10-CM | POA: Insufficient documentation

## 2018-05-04 DIAGNOSIS — Z79890 Hormone replacement therapy (postmenopausal): Secondary | ICD-10-CM | POA: Diagnosis not present

## 2018-05-04 DIAGNOSIS — M5416 Radiculopathy, lumbar region: Secondary | ICD-10-CM | POA: Diagnosis not present

## 2018-05-04 DIAGNOSIS — Z79899 Other long term (current) drug therapy: Secondary | ICD-10-CM | POA: Diagnosis not present

## 2018-05-04 DIAGNOSIS — I4891 Unspecified atrial fibrillation: Secondary | ICD-10-CM | POA: Diagnosis not present

## 2018-05-04 DIAGNOSIS — Z87891 Personal history of nicotine dependence: Secondary | ICD-10-CM | POA: Insufficient documentation

## 2018-05-04 DIAGNOSIS — I420 Dilated cardiomyopathy: Secondary | ICD-10-CM | POA: Diagnosis not present

## 2018-05-04 DIAGNOSIS — M1710 Unilateral primary osteoarthritis, unspecified knee: Secondary | ICD-10-CM | POA: Insufficient documentation

## 2018-05-04 DIAGNOSIS — I110 Hypertensive heart disease with heart failure: Secondary | ICD-10-CM | POA: Diagnosis not present

## 2018-05-04 DIAGNOSIS — I509 Heart failure, unspecified: Secondary | ICD-10-CM | POA: Diagnosis not present

## 2018-05-04 DIAGNOSIS — Z7901 Long term (current) use of anticoagulants: Secondary | ICD-10-CM | POA: Diagnosis not present

## 2018-05-04 DIAGNOSIS — M5116 Intervertebral disc disorders with radiculopathy, lumbar region: Secondary | ICD-10-CM | POA: Insufficient documentation

## 2018-05-04 DIAGNOSIS — R7303 Prediabetes: Secondary | ICD-10-CM | POA: Diagnosis not present

## 2018-05-04 DIAGNOSIS — E785 Hyperlipidemia, unspecified: Secondary | ICD-10-CM | POA: Insufficient documentation

## 2018-05-04 DIAGNOSIS — M1611 Unilateral primary osteoarthritis, right hip: Secondary | ICD-10-CM | POA: Diagnosis not present

## 2018-05-04 DIAGNOSIS — M961 Postlaminectomy syndrome, not elsewhere classified: Secondary | ICD-10-CM | POA: Diagnosis not present

## 2018-05-04 DIAGNOSIS — K219 Gastro-esophageal reflux disease without esophagitis: Secondary | ICD-10-CM | POA: Diagnosis not present

## 2018-05-04 DIAGNOSIS — M5136 Other intervertebral disc degeneration, lumbar region: Secondary | ICD-10-CM | POA: Diagnosis not present

## 2018-05-04 DIAGNOSIS — E039 Hypothyroidism, unspecified: Secondary | ICD-10-CM | POA: Insufficient documentation

## 2018-05-04 DIAGNOSIS — G894 Chronic pain syndrome: Secondary | ICD-10-CM | POA: Insufficient documentation

## 2018-05-04 MED ORDER — GABAPENTIN 300 MG PO CAPS: ORAL_CAPSULE | ORAL | 2 refills | Status: DC

## 2018-05-04 NOTE — Patient Instructions (Signed)
Gabapentin has been escribed to your pharmacy.

## 2018-05-04 NOTE — Progress Notes (Signed)
Patient's Name: [name] DIVIS Sr.  MRN: [id number]  Referring Provider: Birdie Sons, MD  DOB: 24-Oct-1940  PCP: Birdie Sons, MD  DOS: 05/04/2018  Note by: Gillis Santa, MD  Service setting: Ambulatory outpatient  Specialty: Interventional Pain Management  Location: [facility] (AMB) Pain Management Facility  Visit type: Initial Patient Evaluation  Patient type: New Patient   Primary Reason(s) for Visit: Encounter for initial evaluation of one or more chronic problems (new to examiner) potentially causing chronic pain, and posing a threat to normal musculoskeletal function. (Level of risk: High) CC: Back Pain (lower)  HPI  [name] is a 77 y.o. year old, male patient, who comes today to see [place] for the first time for an initial evaluation of his chronic pain. He has Atrial fibrillation ([place]); Hyperlipidemia; Hypothyroidism; Hypertension; History of cardiac catheterization; Congestive cardiomyopathy ([place]); Allergic rhinitis; Arthritis; Congestive heart failure, severe ([place]); GERD (gastroesophageal reflux disease); Hiatal hernia; LBP (low back pain); Swelling of limb; Varicose veins of lower extremities with inflammation; Lumbar herniated disc; Pre-diabetes; Sebaceous cyst; Osteoarthritis of knee; and Primary osteoarthritis of right hip on their problem list. Today he comes in for evaluation of his Back Pain (lower)  Pain Assessment: Location: Lower Back Radiating: to hips bilaterally, around to groin area on right side Onset: More than a month ago Duration: Chronic pain Quality: Constant, Throbbing Severity: 8 /10 (subjective, self-reported pain score)  Note: Reported level is compatible with observation.                         When using our objective Pain Scale, levels between 6 and 10/10 are said to belong in an emergency room, as it progressively worsens from a 6/10, described as severely limiting, requiring emergency care not usually available at an outpatient pain management facility.  At a 6/10 level, communication becomes difficult and requires great effort. Assistance to reach the emergency department may be required. Facial flushing and profuse sweating along with potentially dangerous increases in heart rate and blood pressure will be evident. Effect on ADL: limits daily activities Timing: Constant Modifying factors: nothing helps BP: 134/61  HR: (!) 56  Onset and Duration: Present longer than 3 months Cause of pain: nothing noted Severity: Getting worse and NAS-11 on the average: 10/10 Timing: Not influenced by the time of the day Aggravating Factors: Motion and Nerve blocks Alleviating Factors: Nerve blocks Associated Problems: Temperature changes, Pain that wakes patient up and Pain that does not allow patient to sleep Quality of Pain: Agonizing, Constant, Punishing, Throbbing and Toothache-like Previous Examinations or Tests: CT scan, Ct-Myelogram, Nerve block, Spinal tap, X-rays and Orthopedic evaluation Previous Treatments: Epidural steroid injections and Trigger point injections  The patient comes into the clinics today for the first time for a chronic pain management evaluation.   Patient is a pleasant 77 year old male with a history of 5 previous lumbar spine surgeries with associated left scar and granulation tissue at L3-L4 and L4-L5 as well as a left L5 nerve root particularly in the left L4-L5 subarticular lateral recess.  Patient is status post multiple epidurals which he states were not very effective.  These were transforaminal L2-L3 epidurals.  His last one was October 2018.  Patient also has atrial fibrillation and is currently anticoagulated with Xarelto.  Also has cardiomyopathy.  Patient has tried opioids in the past and does not find them effective.  He wants to avoid.  NSAIDs are contraindicated given that patient has anticoagulation on  board with Xarelto.  He previously had to stop Celebrex given GI bleed.  Patient's creatinine is elevated at 1.80.   Will avoid TCAs.  Patient's options include physical therapy, gabapentin, Lyrica, Cymbalta, possible buprenorphine, TENS.  Today I took the time to provide the patient with information regarding my pain practice. The patient was informed that my practice is divided into two sections: an interventional pain management section, as well as a completely separate and distinct medication management section. I explained that I have procedure days for my interventional therapies, and evaluation days for follow-ups and medication management. Because of the amount of documentation required during both, they are kept separated. This means that there is the possibility that he may be scheduled for a procedure on one day, and medication management the next. I have also informed him that because of staffing and facility limitations, I no longer take patients for medication management only. To illustrate the reasons for this, I gave the patient the example of surgeons, and how inappropriate it would be to refer a patient to his/her care, just to write for the post-surgical antibiotics on a surgery done by a different surgeon.   Because interventional pain management is my board-certified specialty, the patient was informed that joining my practice means that they are open to any and all interventional therapies. I made it clear that this does not mean that they will be forced to have any procedures done. What this means is that I believe interventional therapies to be essential part of the diagnosis and proper management of chronic pain conditions. Therefore, patients not interested in these interventional alternatives will be better served under the care of a different practitioner.  The patient was also made aware of my Comprehensive Pain Management Safety Guidelines where by joining my practice, they limit all of their nerve blocks and joint injections to those done by our practice, for as long as we are retained to  manage their care.   Historic Controlled Substance Pharmacotherapy Review  PMP and historical list of controlled substances: Tramadol 50 mg, quantity 21, last fill 04/08/2018 Medications: The patient did not bring the medication(s) to the appointment, as requested in our "New Patient Package" Pharmacodynamics: Desired effects: Analgesia: The patient reports <50% benefit. Reported improvement in function: The patient reports medication allows him to accomplish basic ADLs. Clinically meaningful improvement in function (CMIF): Sustained CMIF goals met Perceived effectiveness: Described as ineffective and would like to make some changes Undesirable effects: Side-effects or Adverse reactions: None reported Historical Monitoring: The patient  reports that he does not use drugs. List of all UDS Test(s): No results found for: MDMA, COCAINSCRNUR, [place], [employer], CANNABQUANT, [place], [place] List of other Serum/Urine Drug Screening Test(s):  No results found for: AMPHSCRSER, BARBSCRSER, BENZOSCRSER, COCAINSCRSER, COCAINSCRNUR, PCPSCRSER, PCPQUANT, THCSCRSER, THCU, CANNABQUANT, OPIATESCRSER, OXYSCRSER, PROPOXSCRSER, ETH Historical Background Evaluation: [place] PMP: Six (6) year initial data search conducted.              [place] Department of public safety, offender search: Editor, commissioning Information) Non-contributory Risk Assessment Profile: Aberrant behavior: None observed or detected today Risk factors for fatal opioid overdose: None identified today Fatal overdose hazard ratio (HR): Calculation deferred Non-fatal overdose hazard ratio (HR): Calculation deferred Risk of opioid abuse or dependence: 0.7-3.0% with doses ? 36 MME/day and 6.1-26% with doses ? 120 MME/day. Substance use disorder (SUD) risk level: See below Personal History of Substance Abuse (SUD-Substance use disorder):  Alcohol: Negative  Illegal Drugs: Negative  Rx Drugs: Negative  ORT  Risk Level calculation: Low Risk Opioid Risk Tool -  05/04/18 0859      Family History of Substance Abuse   Alcohol  Negative    Illegal Drugs  Negative    Rx Drugs  Negative      Personal History of Substance Abuse   Alcohol  Negative    Illegal Drugs  Negative    Rx Drugs  Negative      Age   Age between 73-45 years   No      History of Preadolescent Sexual Abuse   History of Preadolescent Sexual Abuse  Negative or Male      Psychological Disease   Psychological Disease  Negative    Depression  Negative      Total Score   Opioid Risk Tool Scoring  0    Opioid Risk Interpretation  Low Risk      ORT Scoring interpretation table:  Score <3 = Low Risk for SUD  Score between 4-7 = Moderate Risk for SUD  Score >8 = High Risk for Opioid Abuse   PHQ-2 Depression Scale:  Total score: 0  PHQ-2 Scoring interpretation table: (Score and probability of major depressive disorder)  Score 0 = No depression  Score 1 = 15.4% Probability  Score 2 = 21.1% Probability  Score 3 = 38.4% Probability  Score 4 = 45.5% Probability  Score 5 = 56.4% Probability  Score 6 = 78.6% Probability   PHQ-9 Depression Scale:  Total score: 0  PHQ-9 Scoring interpretation table:  Score 0-4 = No depression  Score 5-9 = Mild depression  Score 10-14 = Moderate depression  Score 15-19 = Moderately severe depression  Score 20-27 = Severe depression (2.4 times higher risk of SUD and 2.89 times higher risk of overuse)   Pharmacologic Plan: [name] indicated having a preference to stay away from opioid analgesics.            Initial impression: The patient indicated having no interest, at this time.  Meds   Current Outpatient Medications:  .  amiodarone (PACERONE) 200 MG tablet, TAKE 1 TABLET (200 MG TOTAL) BY MOUTH ONCE DAILY., Disp: , Rfl: 0 .  furosemide (LASIX) 20 MG tablet, TAKE 1 TABLET(20 MG) BY MOUTH DAILY AS NEEDED FOR FLUID RETENTION, Disp: 30 tablet, Rfl: 3 .  levothyroxine (SYNTHROID, LEVOTHROID) 100 MCG tablet, TAKE 1 TABLET(100 MCG) BY  MOUTH DAILY, Disp: 90 tablet, Rfl: 4 .  levothyroxine (SYNTHROID, LEVOTHROID) 100 MCG tablet, TAKE 1 TABLET(100 MCG) BY MOUTH DAILY, Disp: 30 tablet, Rfl: 4 .  lisinopril-hydrochlorothiazide (PRINZIDE,ZESTORETIC) 10-12.5 MG tablet, TAKE 1 TABLET BY MOUTH EVERY DAY, Disp: 90 tablet, Rfl: 4 .  lovastatin (MEVACOR) 20 MG tablet, TAKE 1 TABLET BY MOUTH EVERY DAY, Disp: 90 tablet, Rfl: 4 .  metoprolol succinate (TOPROL-XL) 25 MG 24 hr tablet, 1 TABLET BY MOUTH ONCE A DAY, Disp: , Rfl: 0 .  traMADol (ULTRAM) 50 MG tablet, TAKE 1 TABLET(50 MG) BY MOUTH EVERY 8 HOURS AS NEEDED, Disp: 30 tablet, Rfl: 3 .  XARELTO 20 MG TABS tablet, TAKE 1 TABLET BY MOUTH EVERY DAY, Disp: 30 tablet, Rfl: 12 .  gabapentin (NEURONTIN) 300 MG capsule, 300 mg nightly for 2 weeks and increase to 300 mg twice daily, Disp: 60 capsule, Rfl: 2  Imaging Review  Lumbosacral Imaging: Lumbar MR wo contrast:  Results for orders placed during the hospital encounter of 09/27/15  MR Lumbar Spine Wo Contrast   Narrative CLINICAL DATA:  Chronic Low back, right  buttock, right hip and groin pain. Three prior back surgeries.  EXAM: MRI LUMBAR SPINE WITHOUT CONTRAST  TECHNIQUE: Multiplanar, multisequence MR imaging of the lumbar spine was performed. No intravenous contrast was administered.  COMPARISON:  None available  FINDINGS: Severe scoliosis and degenerative lumbar spondylosis with multilevel disc disease facet disease. The vertebral bodies demonstrate normal marrow signal. No bone lesions or acute fractures.  No significant paraspinal or retroperitoneal findings. Right renal cysts are noted. Tortuosity of the aorta.  T12-L1: Degenerate disc disease and facet disease but no focal disc protrusion, significant spinal or foraminal stenosis.  L1-2: Central disc protrusion with mass effect on the ventral thecal sac slightly asymmetric right with right lateral recess stenosis possibly affecting the right L2 nerve root. There  are also short pedicles and facet disease contributing to spinal stenosis. Mild right foraminal stenosis.  L2-3 fall interbody cage fusion changes. Moderate facet disease with mild bilateral lateral recess stenosis, right greater than left. No significant spinal stenosis.  L3-4: Bulging degenerated annulus with a focal central disc protrusion and mild mass effect on the ventral thecal sac. There is bilateral lateral recess stenosis and mild bilateral foraminal stenosis, right greater than left.  L4-5: Degenerative disc disease and facet disease but no disc protrusion. Moderate left foraminal stenosis and mild spinal stenosis.  L5-S1: Severe facet disease. No disc protrusions or spinal stenosis.  IMPRESSION: Severe scoliosis degenerative lumbar spondylosis with multilevel disc disease facet disease.  Central disc protrusion at L1-2 slightly asymmetric right with right lateral recess stenosis possibly affecting the right L2 nerve root. There is also mild spinal and right foraminal stenosis at this level.  Bulging degenerated annulus and focal disc protrusion at L3-4 with mild bilateral lateral recess stenosis and mild bilateral foraminal stenosis, right greater than left.  Moderate left foraminal stenosis and mild spinal stenosis at L4-5.   Electronically Signed   By: Marijo Sanes M.D.   On: 09/27/2015 15:04     Lumbar MR w/wo contrast:  Results for orders placed in visit on 07/13/01  MR Lumbar Spine W Wo Contrast   Narrative FINDINGS CLINICAL DATA:  BACK PAIN AND LEFT LEG PAIN. MR OF THE LUMBAR SPINE WITH AND WITHOUT CONTRAST 20 CC. OMNISCAN WAS UTILIZED. NO COMPARISON MR SCAN. FOR THE PRESENT EXAMINATION, I WILL ASSUME THAT THE LAST FULLY OPEN DISC SPACE IS THE L5-S1 LEVEL. THERE IS A LEVOSCOLIOSIS OF THE LUMBAR SPINE.  CONUS IS LOCATED AT THE L1 LEVEL AND POORLY DELINEATED ON THE T2-WEIGHTED SEQUENCES. SINGLE AXIAL IMAGE THROUGH THE T11-12 LEVEL WAS OBTAINED.   SAGITTAL IMAGES THROUGH THIS LEVEL WERE NOT PERFORMED.  THERE APPEARS TO BE A BROAD-BASED PROTRUSION. T12-L1:  ROTATORY SCOLIOSIS WITH MODERATE BROAD-BASED PROTRUSION GREATER EXTENSION RIGHT POSTEROLATERAL POSITION.  RIGHT GREATER THAN LEFT FACET JOINT DEGENERATIVE CHANGES.  MILD TO MODERATE RIGHT-SIDED AND MILD LEFT-SIDED SPINAL STENOSIS.  MILD RIGHT-SIDED NEURAL FORAMINAL NARROWING.  MODERATE RIGHT LATERAL OSTEOPHYTE . L1-2:  ROTATORY SCOLIOSIS WITH SIGNIFICANT DEGENERATIVE CHANGES AND MODERATE SIZED BROAD-BASED PROTRUSION GREATER EXTENSION RIGHT POSTEROLATERAL POSITION.  FACET JOINT BONY OVERGROWTH.  MODERATE RIGHT-SIDED AND MILD LEFT-SIDED SPINAL STENOSIS.  MILD RIGHT-SIDED NEURAL FORAMINAL NARROWING. PROMINENT RIGHT LATERAL OSTEOPHYTE. L2-3:  ROTATORY SCOLIOSIS WITH MILD BULGE.  BILATERAL FACET JOINT DEGENERATIVE CHANGES RIGHT MUCH MORE SO THAN LEFT WITH ALSO MODERATE RIGHT-SIDED SPINAL STENOSIS AND NEURAL FORAMINAL NARROWING. L3-4:  ROTATORY SCOLIOSIS.  MODERATE TO MARKED DISC DEGENERATION.  MODERATE SIZED BROAD-BASED PROTRUSION WITH GREATER EXTENSION RIGHT POSTERIOR LATERAL POSITION.  PROMINENT BILATERAL FACET JOINT DEGENERATIVE CHANGES AND  BONY OVERGROWTH.    MODERATE TO MARKED BILATERAL NEURAL FORAMINAL NARROWING WITH ENCROACHMENT UPON THE COURSE OF THE EXITING L3 NERVE ROOTS, MODERATE BILATERAL SUBARTICULAR LATERAL RECESS STENOSIS.  RIGHT PARACENTRAL FOCAL DISC PROTRUSION WITH IMPRESSION UPON THE RIGHT VENTRAL ASPECT OF THE THECAL SAC WITH MODERATE TO SLIGHTLY MARKED SPINAL STENOSIS. PATIENT APPEARS TO HAVE HAD PRIOR LEFT-SIDED SURGERY. L4-5:  MODERATE DISC DEGENERATION WITH ROTATORY SCOLIOSIS.  BROAD-BASED PROTRUSION AND FACET JOINT DEGENERATIVE CHANGES.  STATUS POST LEFT-SIDED HEMILAMINECTOMY.  POSTOPERATIVE ENHANCING GRANULATION TISSUE SURROUNDS THE LEFT L5 NERVE ROOT IN THE NARROWED LEFT LATERAL RECESS.  MILD TO MODERATE LEFT- SIDED L4-5 NEURAL FORAMINAL NARROWING WITH SLIGHT  ENCROACHMENT UPON THE COURSE OF THE EXITING LEFT L4 NERVE ROOT. L5-S1:  ROTATION.  PROMINENT BILATERAL FACET JOINT DEGENERATIVE CHANGES AND BONY OVERGROWTH.  LEFT LATERAL OSTEOPHYTE.  MILD BULGE.  MODERATE LEFT NEURAL FORAMINAL STENOSIS WITH ENCROACHMENT UPON THE COURSE OF THE EXITING LEFT L5 NERVE ROOT.  MINIMAL LEFT-SIDED SUBARTICULAR LATERAL RECESS STENOSIS. IMPRESSION ROTATORY LEVOSCOLIOSIS WITH SIGNIFICANT DEGENERATIVE CHANGES THROUGHOUT THE LOWER THORACIC AND LUMBAR SPINE.  SEVERAL OF THESE FINDINGS MAY CONTRIBUTE TO THE PATIENT'S BACK PAIN.  THE PATIENT HAS LEFT-SIDED SYMPTOMS AND THE MOST SIGNIFICANT FINDING ON THE LEFT OCCUR AT THE L3-4, L4-5 AND L5- S1 LEVEL AS DISCUSSED ABOVE.  THE PATIENT APPEARS TO HAVE HAD PRIOR SURGERY ON THE LEFT AT THE L3-4 AND L4-5 LEVEL.    Lumbar CT w contrast:  Results for orders placed in visit on 07/14/01  CT Lumbar Spine W Contrast   Narrative FINDINGS CLINICAL DATA:  BACK PAIN AND LEFT LEG PAIN. LUMBAR MYELOGRAM, FOLLOWING BY POST MYELOGRAM CT WITH SAGITTAL AND CORONAL RECONSTRUCTED IMAGES. LUMBAR MYELOGRAM INSTILLATION OF CONTRAST WAS PERFORMED BY DR. Joya Salm AT THE L4-5 LEVEL.   THE PATIENT HAS SMALL RIBS AT THE T12 LEVEL WITH A DEEP SEATED L5-S1 DISC SPACE.  THIS LEVEL ASSIGNMENT WILL NEED TO BE UNDERSTOOD BY ANYONE INVOLVED IN THE PATIENT'S FUTURE CARE.   THERE IS A PROMINENT LEVOROTATORY SCOLIOSIS.  PROMINENT RIGHT LATERAL OSTEOPHYTE T12-L1, L1-2 AND L2-3 AND SMALL BILATERAL OSTEOPHYTES, L3-4.   LEFT GREATER THAN RIGHT OSTEOPHYTE L4-5.  THERE IS SLIGHT UNDER FILLING OF THE LEFT L5 NERVE ROOT. IMPRESSION UPON THE VENTRAL ASPECT OF THE THECAL SAC, L1-2, L2-3 AND L3-4.  NO SIGNIFICANT CHANGE BETWEEN THE LIMITED FLEXION AND EXTENSION THE PATIENT PERFORMED. IMPRESSION UNDERFILLING OF THE LEFT L5 NERVE ROOT. LEVOROTATORY SCOLIOSIS WITH PROMINENT OSTEOPHYTES AS DISCUSSED ABOVE. POST MYELOGRAM CT, PERFORMED WITH AXIAL IMAGES WITH SAGITTAL AND  CORONAL RECONSTRUCTED IMAGES FROM THE LOWER T10 TO THE UPPER SACRUM T10-11: SMALL LEFT PARACENTRAL DISC PROTRUSION WITH IMPRESSION UPON THE LEFT VENTRAL ASPECT OF THE ADJACENT THECAL SAC AND NERVE ROOTS. LEFT LATERAL OSTEOPHYTE. T12-L1:  ANGULATION BY THE SCOLIOSIS WITH BROAD-BASED MODERATE BULGE/SMALL PROTRUSION WITH MILD SPINAL STENOSIS AND IMMPRESSION UPON THE VENTRAL ASPECT OF THE THECAL SAC. T12-L1:  PROMINENT RIGHT LATERAL OSTEOPHYTE.  ANGULATION SECONDARY TO THE SCOLIOSIS.  PROMINENT BILATERAL FACET JOINT DEGENERATIVE CHANGES, RIGHT GREATER THAN LEFT.  THE COMBINATION OF ROTATION, DISC DEGENERATION AND FACET JOINT DEGENERATIVE CHANGES IS CAUSING MODERATE RIGHT-SIDED AND MILD LEFT-SIDED SPINAL STENOSIS. L1-2:  ROTATION WITH PROMINENT RIGHT LATERAL OSTEOPHYTE. BROAD-BASED PROTRUSION WITH GREATEST EXTENSION OF PROTRUDING DISC MATERIAL CENTRALLY, WITH ASSOCIATED OSTEOPHYTE CAUSING MASS EFFECT UPON THE CENTRAL VENTRAL ASPECT OF THE THECAL SAC.  RIGHT GREATER THAN LEFT FACET JOINT DEGENERATIVE CHANGES.  MODERATE RIGHT-SIDED AND MILD TO MODERATE LEFT-SIDED SPINAL STENOSIS. L2-3:  ROTATION.  RIGHT LATERAL OSTEOPHYTE.  MILD TO MODERATE BULGE.  RIGHT  GREATER THAN LEFT FACET JOINT DEGENERATIVE CHANGES AND BONY OVERGROWTH.  MILD RIGHT-SIDED NEURAL FORAMINAL NARROWING AND SPINAL STENOSIS. THE RIGHT LATERAL OSTEOPHYTE ARTICULATION IS CONTRIBUTING TO THE RIGHT-SIDED NEURAL FORAMINAL NARROWING. L3-4:  THE PATIENT APPEARS TO HAVE HAD PRIOR SURGERY AT THIS LEVEL ON THE LEFT.  ROTATION. SIGNIFICANT DISC DEGENERATION WITH BROAD-BASED PROTRUSION/OSTEOPHYTE.  PROMINENT BILATERAL FACET JOINT DEGENERATIVE CHANGES AND BONY OVERGROWTH.  RIGHT PARACENTRAL DISC PROTRUSION AND FOCAL OSTEOPHYTE CAUSING IMPRESSION UPON THE ALREADY NARROWED THECAL SAC.  MULTIFACTORIAL MODERATE TO MARKED SPINAL STENOSIS, BILATERAL SUBARTICULAR LATERAL RECESS STENOSIS AND LEFT GREATER THAN RIGHT NEURAL FORAMINAL NARROWING WITH  ENCROACHMENT UPON THE COURSING EXITING L3 NERVE ROOTS (LEFT GREATER THAN RIGHT). L4-5:  ROTATION. MODERATE BULGE.  PROMINENT BILATERAL FACET JOINT DEGENERATIVE CHANGES (RIGHT GREATER THAN LEFT).   POST-OPERATIVE CHANGES ON THE LEFT WITH SCAR TISSUE SURROUNDING THE LEFT L5 NERVE ROOT.  LEFT SUBARTICULAR LATERAL RECESS STENOSIS WITH CROWDING OF THE COURSE OF THE LEFT L5 NERVE ROOT.  LEFT-SIDED NEURAL FORAMINAL NARROWING WITH CROWDING OF THE COURSE OF THE  EXITING LEFT L4 NERVE ROOT. L5-S1:  ROTATION.  BILATERAL FACET JOINT DEGENERATIVE CHANGES AND BONY OVERGROWTH.  BULGE.  FAR LEFT LATERAL OSTEOPHYTE WITH NARROWING OF THE LEFT L5-S1 NEURAL FORAMEN AND ENCROACHMENT UPON THE COURSING EXITING L5 NERVE ROOT. IMPRESSION IN THIS PATIENT WHO IS PRESENTING WITH LEFT-SIDED SYMPTOMS, THE MOST SIGNIFICANT LEFT-SIDED ABNORMALITIES ARE SEEN AT THE L3-4, L4-5, AND L5-S1 LEVEL AS DISCUSSED ABOVE.  THE COMBINATION OF SCOLIOSIS, ROTATION, DISC DISEASE, FACET JOINT DEGENERATIVE CHANGES AND OSTEOPHYTE FORMATION CONTRIBUTE TO VARIOUS DEGREES OF SPINAL STENOSIS, LATERAL RECESS STENOSIS AND NEURAL FORAMINAL NARROWING  FROM THE T10-11 THROUGH L5-S1 LEVEL.  THE PATIENT APPEARS TO HAVE HAD PRIOR SURGERY ON THE LEFT AT L3-4 AND THE L4-5 LEVEL WITH GRANULATION TISSUES SURROUNDING THE LEFT L5 NERVE ROOT IN THE REGION OF THE NARROWED LEFT L4-5 SUBARTICULAR LATERAL RECESS. POST MYELOGRAM RECONSTRUCTED SAGITTAL AND CORONAL IMAGES OF THE LUMBAR SPINE FROM THE T10 TO THE S1 LEVEL THERE IS A LEVOROTATORY SCOLIOSIS.  LEFT LATERAL OSTEOPHYTE T10-11.  PROMINENT RIGHT LATERAL OSTEOPHYTE, T12-L1, L1-2 AND L2-3. BILATERAL OSTEOPHYTES, L3-4 AND PROMINENT LEFT LATERAL OSTEOPHYTE, L4-5. ON THE RECONSTRUCTED IMAGES,  THERE APPEARS TO BE A LEFT L5 PARS DEFECT, NOT WELL DEMONSTRATED ON THE REMAINDER OF IMAGING.  OTHERWISE, THE RECONSTRUCTED IMAGES CONFIRM THE ABOVE DESCRIBED FINDINGS. IMPRESSION RECONSTRUCTED IMAGES CLEARLY DEMONSTRATE  THE LEVOROTATORY SCOLIOSIS AND OSTEOPHYTES WITH BETTER DEMONSTRATE OF THE LEFT L5 PARS DEFECT. PLEASE SEE ABOVE FOR FURTHER DETAIL.  Provider: Margaretmary Eddy    Lumbar DG Myelogram views:  Results for orders placed in visit on 07/14/01  DG Myelogram Lumbar   Narrative FINDINGS CLINICAL DATA:  BACK PAIN AND LEFT LEG PAIN. LUMBAR MYELOGRAM, FOLLOWING BY POST MYELOGRAM CT WITH SAGITTAL AND CORONAL RECONSTRUCTED IMAGES. LUMBAR MYELOGRAM INSTILLATION OF CONTRAST WAS PERFORMED BY DR. Joya Salm AT THE L4-5 LEVEL.   THE PATIENT HAS SMALL RIBS AT THE T12 LEVEL WITH A DEEP SEATED L5-S1 DISC SPACE.  THIS LEVEL ASSIGNMENT WILL NEED TO BE UNDERSTOOD BY ANYONE INVOLVED IN THE PATIENT'S FUTURE CARE.   THERE IS A PROMINENT LEVOROTATORY SCOLIOSIS.  PROMINENT RIGHT LATERAL OSTEOPHYTE T12-L1, L1-2 AND L2-3 AND SMALL BILATERAL OSTEOPHYTES, L3-4.   LEFT GREATER THAN RIGHT OSTEOPHYTE L4-5.  THERE IS SLIGHT UNDER FILLING OF THE LEFT L5 NERVE ROOT. IMPRESSION UPON THE VENTRAL ASPECT OF THE THECAL SAC, L1-2, L2-3 AND L3-4.  NO SIGNIFICANT CHANGE BETWEEN THE LIMITED FLEXION AND EXTENSION THE PATIENT PERFORMED. IMPRESSION UNDERFILLING OF THE LEFT L5 NERVE ROOT. LEVOROTATORY  SCOLIOSIS WITH PROMINENT OSTEOPHYTES AS DISCUSSED ABOVE. POST MYELOGRAM CT, PERFORMED WITH AXIAL IMAGES WITH SAGITTAL AND CORONAL RECONSTRUCTED IMAGES FROM THE LOWER T10 TO THE UPPER SACRUM T10-11: SMALL LEFT PARACENTRAL DISC PROTRUSION WITH IMPRESSION UPON THE LEFT VENTRAL ASPECT OF THE ADJACENT THECAL SAC AND NERVE ROOTS. LEFT LATERAL OSTEOPHYTE. T12-L1:  ANGULATION BY THE SCOLIOSIS WITH BROAD-BASED MODERATE BULGE/SMALL PROTRUSION WITH MILD SPINAL STENOSIS AND IMMPRESSION UPON THE VENTRAL ASPECT OF THE THECAL SAC. T12-L1:  PROMINENT RIGHT LATERAL OSTEOPHYTE.  ANGULATION SECONDARY TO THE SCOLIOSIS.  PROMINENT BILATERAL FACET JOINT DEGENERATIVE CHANGES, RIGHT GREATER THAN LEFT.  THE COMBINATION OF ROTATION, DISC DEGENERATION AND FACET JOINT  DEGENERATIVE CHANGES IS CAUSING MODERATE RIGHT-SIDED AND MILD LEFT-SIDED SPINAL STENOSIS. L1-2:  ROTATION WITH PROMINENT RIGHT LATERAL OSTEOPHYTE. BROAD-BASED PROTRUSION WITH GREATEST EXTENSION OF PROTRUDING DISC MATERIAL CENTRALLY, WITH ASSOCIATED OSTEOPHYTE CAUSING MASS EFFECT UPON THE CENTRAL VENTRAL ASPECT OF THE THECAL SAC.  RIGHT GREATER THAN LEFT FACET JOINT DEGENERATIVE CHANGES.  MODERATE RIGHT-SIDED AND MILD TO MODERATE LEFT-SIDED SPINAL STENOSIS. L2-3:  ROTATION.  RIGHT LATERAL OSTEOPHYTE.  MILD TO MODERATE BULGE.  RIGHT GREATER THAN LEFT FACET JOINT DEGENERATIVE CHANGES AND BONY OVERGROWTH.  MILD RIGHT-SIDED NEURAL FORAMINAL NARROWING AND SPINAL STENOSIS. THE RIGHT LATERAL OSTEOPHYTE ARTICULATION IS CONTRIBUTING TO THE RIGHT-SIDED NEURAL FORAMINAL NARROWING. L3-4:  THE PATIENT APPEARS TO HAVE HAD PRIOR SURGERY AT THIS LEVEL ON THE LEFT.  ROTATION. SIGNIFICANT DISC DEGENERATION WITH BROAD-BASED PROTRUSION/OSTEOPHYTE.  PROMINENT BILATERAL FACET JOINT DEGENERATIVE CHANGES AND BONY OVERGROWTH.  RIGHT PARACENTRAL DISC PROTRUSION AND FOCAL OSTEOPHYTE CAUSING IMPRESSION UPON THE ALREADY NARROWED THECAL SAC.  MULTIFACTORIAL MODERATE TO MARKED SPINAL STENOSIS, BILATERAL SUBARTICULAR LATERAL RECESS STENOSIS AND LEFT GREATER THAN RIGHT NEURAL FORAMINAL NARROWING WITH ENCROACHMENT UPON THE COURSING EXITING L3 NERVE ROOTS (LEFT GREATER THAN RIGHT). L4-5:  ROTATION. MODERATE BULGE.  PROMINENT BILATERAL FACET JOINT DEGENERATIVE CHANGES (RIGHT GREATER THAN LEFT).   POST-OPERATIVE CHANGES ON THE LEFT WITH SCAR TISSUE SURROUNDING THE LEFT L5 NERVE ROOT.  LEFT SUBARTICULAR LATERAL RECESS STENOSIS WITH CROWDING OF THE COURSE OF THE LEFT L5 NERVE ROOT.  LEFT-SIDED NEURAL FORAMINAL NARROWING WITH CROWDING OF THE COURSE OF THE  EXITING LEFT L4 NERVE ROOT. L5-S1:  ROTATION.  BILATERAL FACET JOINT DEGENERATIVE CHANGES AND BONY OVERGROWTH.  BULGE.  FAR LEFT LATERAL OSTEOPHYTE WITH NARROWING OF THE LEFT L5-S1  NEURAL FORAMEN AND ENCROACHMENT UPON THE COURSING EXITING L5 NERVE ROOT. IMPRESSION IN THIS PATIENT WHO IS PRESENTING WITH LEFT-SIDED SYMPTOMS, THE MOST SIGNIFICANT LEFT-SIDED ABNORMALITIES ARE SEEN AT THE L3-4, L4-5, AND L5-S1 LEVEL AS DISCUSSED ABOVE.  THE COMBINATION OF SCOLIOSIS, ROTATION, DISC DISEASE, FACET JOINT DEGENERATIVE CHANGES AND OSTEOPHYTE FORMATION CONTRIBUTE TO VARIOUS DEGREES OF SPINAL STENOSIS, LATERAL RECESS STENOSIS AND NEURAL FORAMINAL NARROWING  FROM THE T10-11 THROUGH L5-S1 LEVEL.  THE PATIENT APPEARS TO HAVE HAD PRIOR SURGERY ON THE LEFT AT L3-4 AND THE L4-5 LEVEL WITH GRANULATION TISSUES SURROUNDING THE LEFT L5 NERVE ROOT IN THE REGION OF THE NARROWED LEFT L4-5 SUBARTICULAR LATERAL RECESS. POST MYELOGRAM RECONSTRUCTED SAGITTAL AND CORONAL IMAGES OF THE LUMBAR SPINE FROM THE T10 TO THE S1 LEVEL THERE IS A LEVOROTATORY SCOLIOSIS.  LEFT LATERAL OSTEOPHYTE T10-11.  PROMINENT RIGHT LATERAL OSTEOPHYTE, T12-L1, L1-2 AND L2-3. BILATERAL OSTEOPHYTES, L3-4 AND PROMINENT LEFT LATERAL OSTEOPHYTE, L4-5. ON THE RECONSTRUCTED IMAGES,  THERE APPEARS TO BE A LEFT L5 PARS DEFECT, NOT WELL DEMONSTRATED ON THE REMAINDER OF IMAGING.  OTHERWISE, THE RECONSTRUCTED IMAGES CONFIRM THE ABOVE DESCRIBED FINDINGS. IMPRESSION RECONSTRUCTED IMAGES CLEARLY DEMONSTRATE THE LEVOROTATORY SCOLIOSIS AND OSTEOPHYTES WITH BETTER DEMONSTRATE OF  THE LEFT L5 PARS DEFECT. PLEASE SEE ABOVE FOR FURTHER DETAIL.  Provider: Margaretmary Eddy    Complexity Note: Imaging results reviewed. Results shared with [name], using Layman's terms.                         ROS  Cardiovascular: Heart trouble, Abnormal heart rhythm and Blood thinners:  Anticoagulant Pulmonary or Respiratory: Lung problems Neurological: Curved spine Review of Past Neurological Studies: No results found for this or any previous visit. Psychological-Psychiatric: Difficulty sleeping and or falling asleep Gastrointestinal: Heartburn due to  stomach pushing into lungs (Hiatal hernia) and Reflux or heatburn Genitourinary: No reported renal or genitourinary signs or symptoms such as difficulty voiding or producing urine, peeing blood, non-functioning kidney, kidney stones, difficulty emptying the bladder, difficulty controlling the flow of urine, or chronic kidney disease Hematological: Brusing easily Endocrine: No reported endocrine signs or symptoms such as high or low blood sugar, rapid heart rate due to high thyroid levels, obesity or weight gain due to slow thyroid or thyroid disease Rheumatologic: Joint aches and or swelling due to excess weight (Osteoarthritis) Musculoskeletal: Negative for myasthenia gravis, muscular dystrophy, multiple sclerosis or malignant hyperthermia Work History: Retired  Allergies  [name] has No Known Allergies.  Laboratory Chemistry  Inflammation Markers (CRP: Acute Phase) (ESR: Chronic Phase) No results found for: CRP, ESRSEDRATE, LATICACIDVEN                       Rheumatology Markers No results found for: RF, ANA, LABURIC, URICUR, LYMEIGGIGMAB, LYMEABIGMQN, HLAB27                      Renal Function Markers Lab Results  Component Value Date   BUN 18 04/07/2018   CREATININE 1.80 (H) 04/07/2018   BCR 10 04/07/2018   GFRAA 42 (L) 04/07/2018   GFRNONAA 36 (L) 04/07/2018                             Hepatic Function Markers Lab Results  Component Value Date   AST 25 04/07/2018   ALT 24 04/07/2018   ALBUMIN 4.0 04/07/2018   ALKPHOS 67 04/07/2018                        Electrolytes Lab Results  Component Value Date   NA 140 04/07/2018   K 3.2 (L) 04/07/2018   CL 100 04/07/2018   CALCIUM 9.3 04/07/2018   MG 1.9 05/31/2015   PHOS 2.9 06/02/2016                        Neuropathy Markers Lab Results  Component Value Date   HGBA1C 5.5 04/07/2018                        CNS Tests No results found for: COLORCSF, APPEARCSF, RBCCOUNTCSF, WBCCSF, POLYSCSF, LYMPHSCSF, EOSCSF,  PROTEINCSF, GLUCCSF, JCVIRUS, CSFOLI, IGGCSF                      Bone Pathology Markers No results found for: VD25OH, VD125OH2TOT, [id number], [id number], 25OHVITD1, 25OHVITD2, 25OHVITD3, TESTOFREE, TESTOSTERONE                       Coagulation Parameters Lab Results  Component Value Date   PLT 197 03/19/2017  Cardiovascular Markers Lab Results  Component Value Date   HGB 12.6 (L) 03/19/2017   HCT 37.6 (L) 03/19/2017                         CA Markers No results found for: CEA, CA125, LABCA2                      Note: Lab results reviewed.  PFSH  Drug: [name]  reports that he does not use drugs. Alcohol:  reports that he does not drink alcohol. Tobacco:  reports that he quit smoking about 10 years ago. His smoking use included cigarettes. He has a 100.00 pack-year smoking history. He has never used smokeless tobacco. Medical:  has a past medical history of Allergy, History of chicken pox, Hyperlipidemia, and Hypertension. Family: family history includes Alzheimer's disease in his mother; Colon cancer in his brother; Heart attack in his father.  Past Surgical History:  Procedure Laterality Date  . BACK SURGERY     x5  . DOPPLER ECHOCARDIOGRAPHY  06/21/2013   EF=35% while in Sinus Rhythm  . Myocardial perfusion scan  10/2012   severe global LV enlargement. Mild RV enlargement. Mild LVH. Mild mitral and tricuspid insufficency  . polyp excision  09/2005  . UPPER GASTROINTESTINAL ENDOSCOPY  03/27/2004   Gastropathy, Gastritis; Duodenopathy   Active Ambulatory Problems    Diagnosis Date Noted  . Atrial fibrillation ([place]) 12/19/2014  . Hyperlipidemia 12/19/2014  . Hypothyroidism 12/19/2014  . Hypertension 12/19/2014  . History of cardiac catheterization 02/17/2013  . Congestive cardiomyopathy ([place]) 02/17/2013  . Allergic rhinitis 05/29/2015  . Arthritis 05/29/2015  . Congestive heart failure, severe ([place]) 10/04/2012  . GERD (gastroesophageal  reflux disease) 05/29/2015  . Hiatal hernia 05/29/2015  . LBP (low back pain) 05/29/2015  . Swelling of limb 05/29/2015  . Varicose veins of lower extremities with inflammation 05/29/2015  . Lumbar herniated disc 06/02/2016  . Pre-diabetes 03/18/2017  . Sebaceous cyst 03/30/2017  . Osteoarthritis of knee 09/17/2017  . Primary osteoarthritis of right hip 08/17/2015   Resolved Ambulatory Problems    Diagnosis Date Noted  . No Resolved Ambulatory Problems   Past Medical History:  Diagnosis Date  . Allergy   . History of chicken pox    Constitutional Exam  General appearance: Well nourished, well developed, and well hydrated. In no apparent acute distress Vitals:   05/04/18 0847  BP: 134/61  Pulse: (!) 56  Resp: 18  Temp: 98.2 F (36.8 C)  TempSrc: Oral  SpO2: 100%  Weight: 254 lb (115.2 kg)  Height: '6\' 2"'$  (1.88 m)   BMI Assessment: Estimated body mass index is 32.61 kg/m as calculated from the following:   Height as of this encounter: '6\' 2"'$  (1.88 m).   Weight as of this encounter: 254 lb (115.2 kg).  BMI interpretation table: BMI level Category Range association with higher incidence of chronic pain  <18 kg/m2 Underweight   18.5-24.9 kg/m2 Ideal body weight   25-29.9 kg/m2 Overweight Increased incidence by 20%  30-34.9 kg/m2 Obese (Class I) Increased incidence by 68%  35-39.9 kg/m2 Severe obesity (Class II) Increased incidence by 136%  >40 kg/m2 Extreme obesity (Class III) Increased incidence by 254%   Patient's current BMI Ideal Body weight  Body mass index is 32.61 kg/m. Ideal body weight: 82.2 kg (181 lb 3.5 oz) Adjusted ideal body weight: 95.4 kg (210 lb 5.3 oz)   BMI Readings from Last 4  Encounters:  05/04/18 32.61 kg/m  04/07/18 32.35 kg/m  09/17/17 33.13 kg/m  07/07/17 34.02 kg/m   Wt Readings from Last 4 Encounters:  05/04/18 254 lb (115.2 kg)  04/07/18 252 lb (114.3 kg)  09/17/17 258 lb (117 kg)  07/07/17 265 lb (120.2 kg)  Psych/Mental  status: Alert, oriented x 3 (person, place, & time)       Eyes: PERLA Respiratory: No evidence of acute respiratory distress  Cervical Spine Area Exam  Skin & Axial Inspection: No masses, redness, edema, swelling, or associated skin lesions Alignment: Symmetrical Functional ROM: Unrestricted ROM      Stability: No instability detected Muscle Tone/Strength: Functionally intact. No obvious neuro-muscular anomalies detected. Sensory (Neurological): Unimpaired Palpation: No palpable anomalies              Upper Extremity (UE) Exam    Side: Right upper extremity  Side: Left upper extremity  Skin & Extremity Inspection: Skin color, temperature, and hair growth are WNL. No peripheral edema or cyanosis. No masses, redness, swelling, asymmetry, or associated skin lesions. No contractures.  Skin & Extremity Inspection: Skin color, temperature, and hair growth are WNL. No peripheral edema or cyanosis. No masses, redness, swelling, asymmetry, or associated skin lesions. No contractures.  Functional ROM: Unrestricted ROM          Functional ROM: Unrestricted ROM          Muscle Tone/Strength: Functionally intact. No obvious neuro-muscular anomalies detected.  Muscle Tone/Strength: Functionally intact. No obvious neuro-muscular anomalies detected.  Sensory (Neurological): Unimpaired          Sensory (Neurological): Unimpaired          Palpation: No palpable anomalies              Palpation: No palpable anomalies              Provocative Test(s):  Phalen's test: deferred Tinel's test: deferred Apley's scratch test (touch opposite shoulder):  Action 1 (Across chest): deferred Action 2 (Overhead): deferred Action 3 (LB reach): deferred   Provocative Test(s):  Phalen's test: deferred Tinel's test: deferred Apley's scratch test (touch opposite shoulder):  Action 1 (Across chest): deferred Action 2 (Overhead): deferred Action 3 (LB reach): deferred    Thoracic Spine Area Exam  Skin & Axial  Inspection: No masses, redness, or swelling Alignment: Symmetrical Functional ROM: Unrestricted ROM Stability: No instability detected Muscle Tone/Strength: Functionally intact. No obvious neuro-muscular anomalies detected. Sensory (Neurological): Unimpaired Muscle strength & Tone: No palpable anomalies  Lumbar Spine Area Exam  Skin & Axial Inspection: Well healed scar from previous spine surgery detected Alignment: Symmetrical Functional ROM: Decreased ROM       Stability: No instability detected Muscle Tone/Strength: Functionally intact. No obvious neuro-muscular anomalies detected. Sensory (Neurological): Dermatomal pain pattern and muscular skeletal Palpation: No palpable anomalies       Provocative Tests: Hyperextension/rotation test: (+) bilaterally for facet joint pain. Lumbar quadrant test (Kemp's test): (+) bilaterally for facet joint pain. Lateral bending test: (+) ipsilateral radicular pain, bilaterally. Positive for bilateral foraminal stenosis. Patrick's Maneuver: deferred today                   FABER test: deferred today                   S-I anterior distraction/compression test: deferred today         S-I lateral compression test: deferred today         S-I Thigh-thrust test: deferred today  S-I Gaenslen's test: deferred today          Gait & Posture Assessment  Ambulation: Limited Gait: Antalgic Posture: Difficulty standing up straight, due to pain   Lower Extremity Exam    Side: Right lower extremity  Side: Left lower extremity  Stability: No instability observed          Stability: No instability observed          Skin & Extremity Inspection: Skin color, temperature, and hair growth are WNL. No peripheral edema or cyanosis. No masses, redness, swelling, asymmetry, or associated skin lesions. No contractures.  Skin & Extremity Inspection: Skin color, temperature, and hair growth are WNL. No peripheral edema or cyanosis. No masses, redness, swelling,  asymmetry, or associated skin lesions. No contractures.  Functional ROM: Decreased ROM for all joints of the lower extremity          Functional ROM: Decreased ROM for all joints of the lower extremity          Muscle Tone/Strength: Functionally intact. No obvious neuro-muscular anomalies detected.  Muscle Tone/Strength: Functionally intact. No obvious neuro-muscular anomalies detected.  Sensory (Neurological): Unimpaired  Sensory (Neurological): Unimpaired  Palpation: No palpable anomalies  Palpation: No palpable anomalies   Assessment  Primary Diagnosis & Pertinent Problem List: The primary encounter diagnosis was Failed back surgical syndrome. Diagnoses of Lumbar radiculopathy, chronic, Lumbar degenerative disc disease, Congestive cardiomyopathy ([place]), Congestive heart failure, severe ([place]), Atrial fibrillation, unspecified type ([place]) (On Xarelto), and Chronic pain syndrome were also pertinent to this visit.  Visit Diagnosis (New problems to examiner): 1. Failed back surgical syndrome   2. Lumbar radiculopathy, chronic   3. Lumbar degenerative disc disease   4. Congestive cardiomyopathy ([place])   5. Congestive heart failure, severe ([place])   6. Atrial fibrillation, unspecified type ([place]) (On Xarelto)   7. Chronic pain syndrome   General Recommendations: The pain condition that the patient suffers from is best treated with a multidisciplinary approach that involves an increase in physical activity to prevent de-conditioning and worsening of the pain cycle, as well as psychological counseling (formal and/or informal) to address the co-morbid psychological affects of pain. Treatment will often involve judicious use of pain medications and interventional procedures to decrease the pain, allowing the patient to participate in the physical activity that will ultimately produce long-lasting pain reductions. The goal of the multidisciplinary approach is to return the patient to a higher level of overall  function and to restore their ability to perform activities of daily living.  Patient is a pleasant 77 year old male with a history of 5 previous lumbar spine surgeries with associated left scar and granulation tissue at L3-L4 and L4-L5 as well as a left L5 nerve root particularly in the left L4-L5 subarticular lateral recess.  Patient is status post multiple epidurals which he states were not very effective.  These were transforaminal L2-L3 epidurals.  His last one was October 2018.  Patient also has atrial fibrillation and is currently anticoagulated with Xarelto.  Also has cardiomyopathy.  Patient has tried opioids in the past and does not find them effective.  He wants to avoid.  NSAIDs are contraindicated given that patient has anticoagulation on board with Xarelto.  He previously had to stop Celebrex given GI bleed.  Patient's creatinine is elevated at 1.80.  Will avoid TCAs.  Patient's options include physical therapy, gabapentin, Lyrica, Cymbalta, possible buprenorphine, TENS.  Not an interventional candidate, high risk to be off of his Xarelto.  Maximum gabapentin dose  according to GFR is 600 mg twice daily.  We will start at 300 mg nightly and increase to 300 mg twice daily  Plan of Care (Initial workup plan)   Orders Placed This Encounter  Procedures  . Compliance Drug Analysis, Ur   Pharmacotherapy (current): Medications ordered:  Meds ordered this encounter  Medications  . gabapentin (NEURONTIN) 300 MG capsule    Sig: 300 mg nightly for 2 weeks and increase to 300 mg twice daily    Dispense:  60 capsule    Refill:  2   Medications administered during this visit: [name] Friends Sr. had no medications administered during this visit.   Pharmacological management options:  Opioid Analgesics: The patient was informed that there is no guarantee that he would be a candidate for opioid analgesics. The decision will be made following CDC guidelines. This decision will be based on the  results of diagnostic studies, as well as [name]'s risk profile.   Membrane stabilizer: To be determined at a later time  Muscle relaxant: To be determined at a later time  NSAID: Medically contraindicated patient on Xarelto  Other analgesic(s): To be determined at a later time   Interventional management options: [name] was informed that there is no guarantee that he would be a candidate for interventional therapies. The decision will be based on the results of diagnostic studies, as well as [name]'s risk profile.  Procedure(s) under consideration:  Not a candidate   Provider-requested follow-up: Return in about 4 weeks (around 06/01/2018) for Medication Management.  No future appointments.  Primary Care Physician: Birdie Sons, MD Location: [facility] Note by: Gillis Santa, M.D, Date: 05/04/2018; Time: 9:25 AM  Patient Instructions  Gabapentin has been escribed to your pharmacy.

## 2018-05-04 NOTE — Progress Notes (Signed)
Safety precautions to be maintained throughout the outpatient stay will include: orient to surroundings, keep bed in low position, maintain call bell within reach at all times, provide assistance with transfer out of bed and ambulation.  

## 2018-05-07 LAB — COMPLIANCE DRUG ANALYSIS, UR

## 2018-05-10 DIAGNOSIS — I420 Dilated cardiomyopathy: Secondary | ICD-10-CM | POA: Diagnosis not present

## 2018-05-10 DIAGNOSIS — E785 Hyperlipidemia, unspecified: Secondary | ICD-10-CM | POA: Diagnosis not present

## 2018-05-10 DIAGNOSIS — I5022 Chronic systolic (congestive) heart failure: Secondary | ICD-10-CM | POA: Diagnosis not present

## 2018-05-10 DIAGNOSIS — I10 Essential (primary) hypertension: Secondary | ICD-10-CM | POA: Diagnosis not present

## 2018-05-10 DIAGNOSIS — I480 Paroxysmal atrial fibrillation: Secondary | ICD-10-CM | POA: Diagnosis not present

## 2018-06-01 ENCOUNTER — Ambulatory Visit
Payer: PPO | Attending: Student in an Organized Health Care Education/Training Program | Admitting: Student in an Organized Health Care Education/Training Program

## 2018-06-01 ENCOUNTER — Encounter: Payer: Self-pay | Admitting: Student in an Organized Health Care Education/Training Program

## 2018-06-01 ENCOUNTER — Other Ambulatory Visit: Payer: Self-pay

## 2018-06-01 VITALS — BP 121/55 | HR 34 | Temp 97.3°F | Resp 16 | Ht 74.0 in | Wt 255.0 lb

## 2018-06-01 DIAGNOSIS — M1611 Unilateral primary osteoarthritis, right hip: Secondary | ICD-10-CM | POA: Insufficient documentation

## 2018-06-01 DIAGNOSIS — M5136 Other intervertebral disc degeneration, lumbar region: Secondary | ICD-10-CM | POA: Diagnosis not present

## 2018-06-01 DIAGNOSIS — M549 Dorsalgia, unspecified: Secondary | ICD-10-CM | POA: Diagnosis present

## 2018-06-01 DIAGNOSIS — E785 Hyperlipidemia, unspecified: Secondary | ICD-10-CM | POA: Diagnosis not present

## 2018-06-01 DIAGNOSIS — Z79890 Hormone replacement therapy (postmenopausal): Secondary | ICD-10-CM | POA: Insufficient documentation

## 2018-06-01 DIAGNOSIS — I110 Hypertensive heart disease with heart failure: Secondary | ICD-10-CM | POA: Diagnosis not present

## 2018-06-01 DIAGNOSIS — Z79899 Other long term (current) drug therapy: Secondary | ICD-10-CM | POA: Insufficient documentation

## 2018-06-01 DIAGNOSIS — I4891 Unspecified atrial fibrillation: Secondary | ICD-10-CM | POA: Diagnosis not present

## 2018-06-01 DIAGNOSIS — G894 Chronic pain syndrome: Secondary | ICD-10-CM

## 2018-06-01 DIAGNOSIS — I420 Dilated cardiomyopathy: Secondary | ICD-10-CM | POA: Diagnosis not present

## 2018-06-01 DIAGNOSIS — M4726 Other spondylosis with radiculopathy, lumbar region: Secondary | ICD-10-CM | POA: Insufficient documentation

## 2018-06-01 DIAGNOSIS — M961 Postlaminectomy syndrome, not elsewhere classified: Secondary | ICD-10-CM | POA: Diagnosis not present

## 2018-06-01 DIAGNOSIS — I509 Heart failure, unspecified: Secondary | ICD-10-CM | POA: Insufficient documentation

## 2018-06-01 DIAGNOSIS — R7303 Prediabetes: Secondary | ICD-10-CM | POA: Diagnosis not present

## 2018-06-01 DIAGNOSIS — Z7901 Long term (current) use of anticoagulants: Secondary | ICD-10-CM | POA: Diagnosis not present

## 2018-06-01 DIAGNOSIS — M5116 Intervertebral disc disorders with radiculopathy, lumbar region: Secondary | ICD-10-CM | POA: Diagnosis not present

## 2018-06-01 DIAGNOSIS — Z87891 Personal history of nicotine dependence: Secondary | ICD-10-CM | POA: Diagnosis not present

## 2018-06-01 DIAGNOSIS — Z8249 Family history of ischemic heart disease and other diseases of the circulatory system: Secondary | ICD-10-CM | POA: Insufficient documentation

## 2018-06-01 DIAGNOSIS — E039 Hypothyroidism, unspecified: Secondary | ICD-10-CM | POA: Diagnosis not present

## 2018-06-01 DIAGNOSIS — M5416 Radiculopathy, lumbar region: Secondary | ICD-10-CM | POA: Diagnosis not present

## 2018-06-01 MED ORDER — GABAPENTIN 300 MG PO CAPS: ORAL_CAPSULE | ORAL | 4 refills | Status: DC

## 2018-06-01 NOTE — Progress Notes (Signed)
Patient's Name: [name] HUSBY Sr.  MRN: [id number]  Referring Provider: Birdie Sons, MD  DOB: Aug 15, 1940  PCP: Birdie Sons, MD  DOS: 06/01/2018  Note by: Gillis Santa, MD  Service setting: Ambulatory outpatient  Specialty: Interventional Pain Management  Location: [facility] (AMB) Pain Management Facility    Patient type: Established   Primary Reason(s) for Visit: Encounter for prescription drug management. (Level of risk: moderate)  CC: Back Pain (low)  HPI  [name] is a 77 y.o. year old, male patient, who comes today for a medication management evaluation. He has Atrial fibrillation ([place]); Hyperlipidemia; Hypothyroidism; Hypertension; History of cardiac catheterization; Congestive cardiomyopathy ([place]); Allergic rhinitis; Arthritis; Congestive heart failure, severe ([place]); GERD (gastroesophageal reflux disease); Hiatal hernia; LBP (low back pain); Swelling of limb; Varicose veins of lower extremities with inflammation; Lumbar herniated disc; Pre-diabetes; Sebaceous cyst; Osteoarthritis of knee; and Primary osteoarthritis of right hip on their problem list. His primarily concern today is the Back Pain (low)  Pain Assessment: Location: Lower Back Radiating: radiates into right hip and groin and into leg sometimes Onset: More than a month ago Duration: Chronic pain Quality: Constant, Throbbing Severity: 5 /10 (subjective, self-reported pain score)  Note: Reported level is compatible with observation.                         When using our objective Pain Scale, levels between 6 and 10/10 are said to belong in an emergency room, as it progressively worsens from a 6/10, described as severely limiting, requiring emergency care not usually available at an outpatient pain management facility. At a 6/10 level, communication becomes difficult and requires great effort. Assistance to reach the emergency department may be required. Facial flushing and profuse sweating along with potentially dangerous  increases in heart rate and blood pressure will be evident. Effect on ADL: Limitis Timing: Constant Modifying factors: medications BP: (!) 121/55  HR: (!) 34(manually.  Asymptomatic)  [name] was last scheduled for an appointment on 05/04/2018 for medication management. During today's appointment we reviewed [name]'s chronic pain status, as well as his outpatient medication regimen.  Patient on max, renally dosed gabapentin 600 mg twice daily which he states is beneficial for his pain as well as insomnia.  He states that he is able to function without as much pain is able to fall asleep at night.  He is pleased with the relief that he is getting on gabapentin.  We will continue.  The patient  reports that he does not use drugs. His body mass index is 32.74 kg/m.  Further details on both, my assessment(s), as well as the proposed treatment plan, please see below.   Laboratory Chemistry  Inflammation Markers (CRP: Acute Phase) (ESR: Chronic Phase) No results found for: CRP, ESRSEDRATE, LATICACIDVEN                       Rheumatology Markers No results found for: RF, ANA, LABURIC, URICUR, LYMEIGGIGMAB, LYMEABIGMQN, HLAB27                      Renal Function Markers Lab Results  Component Value Date   BUN 18 04/07/2018   CREATININE 1.80 (H) 04/07/2018   BCR 10 04/07/2018   GFRAA 42 (L) 04/07/2018   GFRNONAA 36 (L) 04/07/2018  Hepatic Function Markers Lab Results  Component Value Date   AST 25 04/07/2018   ALT 24 04/07/2018   ALBUMIN 4.0 04/07/2018   ALKPHOS 67 04/07/2018                        Electrolytes Lab Results  Component Value Date   NA 140 04/07/2018   K 3.2 (L) 04/07/2018   CL 100 04/07/2018   CALCIUM 9.3 04/07/2018   MG 1.9 05/31/2015   PHOS 2.9 06/02/2016                        Neuropathy Markers Lab Results  Component Value Date   HGBA1C 5.5 04/07/2018                        CNS Tests No results found for:  COLORCSF, APPEARCSF, RBCCOUNTCSF, WBCCSF, POLYSCSF, LYMPHSCSF, EOSCSF, PROTEINCSF, GLUCCSF, JCVIRUS, CSFOLI, IGGCSF                      Bone Pathology Markers No results found for: VD25OH, [id number], [id number], [id number], 25OHVITD1, 25OHVITD2, 25OHVITD3, TESTOFREE, TESTOSTERONE                       Coagulation Parameters Lab Results  Component Value Date   PLT 197 03/19/2017                        Cardiovascular Markers Lab Results  Component Value Date   HGB 12.6 (L) 03/19/2017   HCT 37.6 (L) 03/19/2017                         CA Markers No results found for: CEA, CA125, LABCA2                      Note: Lab results reviewed.  Recent Diagnostic Imaging Results  MR Lumbar Spine Wo Contrast CLINICAL DATA:  Chronic Low back, right buttock, right hip and groin pain. Three prior back surgeries.  EXAM: MRI LUMBAR SPINE WITHOUT CONTRAST  TECHNIQUE: Multiplanar, multisequence MR imaging of the lumbar spine was performed. No intravenous contrast was administered.  COMPARISON:  None available  FINDINGS: Severe scoliosis and degenerative lumbar spondylosis with multilevel disc disease facet disease. The vertebral bodies demonstrate normal marrow signal. No bone lesions or acute fractures.  No significant paraspinal or retroperitoneal findings. Right renal cysts are noted. Tortuosity of the aorta.  T12-L1: Degenerate disc disease and facet disease but no focal disc protrusion, significant spinal or foraminal stenosis.  L1-2: Central disc protrusion with mass effect on the ventral thecal sac slightly asymmetric right with right lateral recess stenosis possibly affecting the right L2 nerve root. There are also short pedicles and facet disease contributing to spinal stenosis. Mild right foraminal stenosis.  L2-3 fall interbody cage fusion changes. Moderate facet disease with mild bilateral lateral recess stenosis, right greater than left. No significant spinal  stenosis.  L3-4: Bulging degenerated annulus with a focal central disc protrusion and mild mass effect on the ventral thecal sac. There is bilateral lateral recess stenosis and mild bilateral foraminal stenosis, right greater than left.  L4-5: Degenerative disc disease and facet disease but no disc protrusion. Moderate left foraminal stenosis and mild spinal stenosis.  L5-S1: Severe facet disease. No disc protrusions or spinal stenosis.  IMPRESSION: Severe scoliosis degenerative lumbar  spondylosis with multilevel disc disease facet disease.  Central disc protrusion at L1-2 slightly asymmetric right with right lateral recess stenosis possibly affecting the right L2 nerve root. There is also mild spinal and right foraminal stenosis at this level.  Bulging degenerated annulus and focal disc protrusion at L3-4 with mild bilateral lateral recess stenosis and mild bilateral foraminal stenosis, right greater than left.  Moderate left foraminal stenosis and mild spinal stenosis at L4-5.  Electronically Signed   By: Marijo Sanes M.D.   On: 09/27/2015 15:04  Complexity Note: Imaging results reviewed. Results shared with Mr. Hinks, using Layman's terms.                         Meds   Current Outpatient Medications:  .  amiodarone (PACERONE) 200 MG tablet, TAKE 1 TABLET (200 MG TOTAL) BY MOUTH ONCE DAILY., Disp: , Rfl: 0 .  furosemide (LASIX) 20 MG tablet, TAKE 1 TABLET(20 MG) BY MOUTH DAILY AS NEEDED FOR FLUID RETENTION, Disp: 30 tablet, Rfl: 3 .  gabapentin (NEURONTIN) 300 MG capsule, 300 mg nightly for 2 weeks and increase to 300 mg twice daily, Disp: 60 capsule, Rfl: 4 .  levothyroxine (SYNTHROID, LEVOTHROID) 100 MCG tablet, TAKE 1 TABLET(100 MCG) BY MOUTH DAILY, Disp: 90 tablet, Rfl: 4 .  lisinopril-hydrochlorothiazide (PRINZIDE,ZESTORETIC) 10-12.5 MG tablet, TAKE 1 TABLET BY MOUTH EVERY DAY, Disp: 90 tablet, Rfl: 4 .  lovastatin (MEVACOR) 20 MG tablet, TAKE 1 TABLET BY MOUTH  EVERY DAY, Disp: 90 tablet, Rfl: 4 .  metoprolol succinate (TOPROL-XL) 25 MG 24 hr tablet, 1 TABLET BY MOUTH ONCE A DAY, Disp: , Rfl: 0 .  XARELTO 20 MG TABS tablet, TAKE 1 TABLET BY MOUTH EVERY DAY, Disp: 30 tablet, Rfl: 12  ROS  Constitutional: Denies any fever or chills Gastrointestinal: No reported hemesis, hematochezia, vomiting, or acute GI distress Musculoskeletal: Denies any acute onset joint swelling, redness, loss of ROM, or weakness Neurological: No reported episodes of acute onset apraxia, aphasia, dysarthria, agnosia, amnesia, paralysis, loss of coordination, or loss of consciousness  Allergies  Mr. Lamour has No Known Allergies.  PFSH  Drug: Mr. Hyser  reports that he does not use drugs. Alcohol:  reports that he does not drink alcohol. Tobacco:  reports that he quit smoking about 10 years ago. His smoking use included cigarettes. He has a 100.00 pack-year smoking history. He has never used smokeless tobacco. Medical:  has a past medical history of Allergy, History of chicken pox, Hyperlipidemia, and Hypertension. Surgical: Mr. Agostino  has a past surgical history that includes polyp excision (09/2005); Back surgery; doppler echocardiography (06/21/2013); Myocardial perfusion scan (10/2012); and Upper gastrointestinal endoscopy (03/27/2004). Family: family history includes Alzheimer's disease in his mother; Colon cancer in his brother; Heart attack in his father.  Constitutional Exam  General appearance: Well nourished, well developed, and well hydrated. In no apparent acute distress Vitals:   06/01/18 1026 06/01/18 1029 06/01/18 1030  BP:  (!) 121/55   Pulse: (!) 32  (!) 34  Resp: 16    Temp: (!) 97.3 F (36.3 C)    SpO2: 98%    Weight: 255 lb (115.7 kg)    Height: _0  (1.88 m)     BMI Assessment: Estimated body mass index is 32.74 kg/m as calculated from the following:   Height as of this encounter: _1  (1.88 m).   Weight as of this encounter: 255 lb  (115.7 kg).  BMI interpretation table: BMI  level Category Range association with higher incidence of chronic pain  <18 kg/m2 Underweight   18.5-24.9 kg/m2 Ideal body weight   25-29.9 kg/m2 Overweight Increased incidence by 20%  30-34.9 kg/m2 Obese (Class I) Increased incidence by 68%  35-39.9 kg/m2 Severe obesity (Class II) Increased incidence by 136%  >40 kg/m2 Extreme obesity (Class III) Increased incidence by 254%   Patient's current BMI Ideal Body weight  Body mass index is 32.74 kg/m. Ideal body weight: 82.2 kg (181 lb 3.5 oz) Adjusted ideal body weight: 95.6 kg (210 lb 11.7 oz)   BMI Readings from Last 4 Encounters:  06/01/18 32.74 kg/m  05/04/18 32.61 kg/m  04/07/18 32.35 kg/m  09/17/17 33.13 kg/m   Wt Readings from Last 4 Encounters:  06/01/18 255 lb (115.7 kg)  05/04/18 254 lb (115.2 kg)  04/07/18 252 lb (114.3 kg)  09/17/17 258 lb (117 kg)  Psych/Mental status: Alert, oriented x 3 (person, place, & time)       Eyes: PERLA Respiratory: No evidence of acute respiratory distress  Cervical Spine Area Exam  Skin & Axial Inspection: No masses, redness, edema, swelling, or associated skin lesions Alignment: Symmetrical Functional ROM: Unrestricted ROM      Stability: No instability detected Muscle Tone/Strength: Functionally intact. No obvious neuro-muscular anomalies detected. Sensory (Neurological): Unimpaired Palpation: No palpable anomalies              Upper Extremity (UE) Exam    Side: Right upper extremity  Side: Left upper extremity  Skin & Extremity Inspection: Skin color, temperature, and hair growth are WNL. No peripheral edema or cyanosis. No masses, redness, swelling, asymmetry, or associated skin lesions. No contractures.  Skin & Extremity Inspection: Skin color, temperature, and hair growth are WNL. No peripheral edema or cyanosis. No masses, redness, swelling, asymmetry, or associated skin lesions. No contractures.  Functional ROM: Unrestricted ROM           Functional ROM: Unrestricted ROM          Muscle Tone/Strength: Functionally intact. No obvious neuro-muscular anomalies detected.  Muscle Tone/Strength: Functionally intact. No obvious neuro-muscular anomalies detected.  Sensory (Neurological): Unimpaired          Sensory (Neurological): Unimpaired          Palpation: No palpable anomalies              Palpation: No palpable anomalies              Provocative Test(s):  Phalen's test: deferred Tinel's test: deferred Apley's scratch test (touch opposite shoulder):  Action 1 (Across chest): deferred Action 2 (Overhead): deferred Action 3 (LB reach): deferred   Provocative Test(s):  Phalen's test: deferred Tinel's test: deferred Apley's scratch test (touch opposite shoulder):  Action 1 (Across chest): deferred Action 2 (Overhead): deferred Action 3 (LB reach): deferred    Thoracic Spine Area Exam  Skin & Axial Inspection: No masses, redness, or swelling Alignment: Symmetrical Functional ROM: Unrestricted ROM Stability: No instability detected Muscle Tone/Strength: Functionally intact. No obvious neuro-muscular anomalies detected. Sensory (Neurological): Unimpaired Muscle strength & Tone: No palpable anomalies   Lumbar Spine Area Exam  Skin & Axial Inspection: Well healed scar from previous spine surgery detected Alignment: Symmetrical Functional ROM: Decreased ROM       Stability: No instability detected Muscle Tone/Strength: Functionally intact. No obvious neuro-muscular anomalies detected. Sensory (Neurological): Dermatomal pain pattern and muscular skeletal Palpation: No palpable anomalies       Provocative Tests: Hyperextension/rotation test: (+) bilaterally for facet joint  pain. Lumbar quadrant test (Kemp's test): (+) bilaterally for facet joint pain. Lateral bending test: (+) ipsilateral radicular pain, bilaterally. Positive for bilateral foraminal stenosis. Patrick's Maneuver: deferred today                    FABER test: deferred today                   S-I anterior distraction/compression test: deferred today         S-I lateral compression test: deferred today         S-I Thigh-thrust test: deferred today         S-I Gaenslen's test: deferred today          Gait & Posture Assessment  Ambulation: Limited Gait: Antalgic Posture: Difficulty standing up straight, due to pain    Lower Extremity Exam    Side: Right lower extremity  Side: Left lower extremity  Stability: No instability observed          Stability: No instability observed          Skin & Extremity Inspection: Skin color, temperature, and hair growth are WNL. No peripheral edema or cyanosis. No masses, redness, swelling, asymmetry, or associated skin lesions. No contractures.  Skin & Extremity Inspection: Skin color, temperature, and hair growth are WNL. No peripheral edema or cyanosis. No masses, redness, swelling, asymmetry, or associated skin lesions. No contractures.  Functional ROM: Unrestricted ROM                  Functional ROM: Unrestricted ROM                  Muscle Tone/Strength: Functionally intact. No obvious neuro-muscular anomalies detected.  Muscle Tone/Strength: Functionally intact. No obvious neuro-muscular anomalies detected.  Sensory (Neurological): Unimpaired        Sensory (Neurological): Unimpaired        DTR: Patellar: deferred today Achilles: deferred today Plantar: deferred today  DTR: Patellar: deferred today Achilles: deferred today Plantar: deferred today  Palpation: No palpable anomalies  Palpation: No palpable anomalies   Assessment  Primary Diagnosis & Pertinent Problem List: The primary encounter diagnosis was Failed back surgical syndrome. Diagnoses of Lumbar radiculopathy, chronic, Lumbar degenerative disc disease, Congestive cardiomyopathy ([place]), Congestive heart failure, severe ([place]), Atrial fibrillation, unspecified type ([place]) (On Xarelto), and Chronic pain syndrome were also pertinent to  this visit.  Status Diagnosis  Controlled Controlled Controlled 1. Failed back surgical syndrome   2. Lumbar radiculopathy, chronic   3. Lumbar degenerative disc disease   4. Congestive cardiomyopathy ([place])   5. Congestive heart failure, severe ([place])   6. Atrial fibrillation, unspecified type ([place]) (On Xarelto)   7. Chronic pain syndrome      General Recommendations: The pain condition that the patient suffers from is best treated with a multidisciplinary approach that involves an increase in physical activity to prevent de-conditioning and worsening of the pain cycle, as well as psychological counseling (formal and/or informal) to address the co-morbid psychological affects of pain. Treatment will often involve judicious use of pain medications and interventional procedures to decrease the pain, allowing the patient to participate in the physical activity that will ultimately produce long-lasting pain reductions. The goal of the multidisciplinary approach is to return the patient to a higher level of overall function and to restore their ability to perform activities of daily living.  Patient is a pleasant 77 year old male with a history of 5 previous lumbar spine surgeries with associated  left scar and granulation tissue at L3-L4 and L4-L5 as well as a left L5 nerve root particularly in the left L4-L5 subarticular lateral recess.  Patient is status post multiple epidurals which he states were not very effective.  These were transforaminal L2-L3 epidurals.  His last one was October 2018.  Patient also has atrial fibrillation and is currently anticoagulated with Xarelto.  Also has cardiomyopathy.  Patient has tried opioids in the past and does not find them effective.  He wants to avoid.  NSAIDs are contraindicated given that patient has anticoagulation on board with Xarelto.  He previously had to stop Celebrex given GI bleed.  Patient's creatinine is elevated at 1.80.  Will avoid TCAs.   Patient  on max, renally dosed gabapentin 600 mg twice daily which he states is beneficial for his pain as well as insomnia.  He states that he is able to function without as much pain is able to fall asleep at night.  He is pleased with the relief that he is getting on gabapentin.  We will continue.  Future considerations: Lyrica, Cymbalta, possible buprenorphine, TENS.  Not an interventional candidate, high risk to be off of his Xarelto.  Plan of Care  Pharmacotherapy (Medications Ordered): Meds ordered this encounter  Medications  . gabapentin (NEURONTIN) 300 MG capsule    Sig: 300 mg nightly for 2 weeks and increase to 300 mg twice daily    Dispense:  60 capsule    Refill:  4   Time Note: Greater than 50% of the 25 minute(s) of face-to-face time spent with Mr. Javid, was spent in counseling/coordination of care regarding: Mr. Mackley's primary cause of pain, the treatment plan, medication side effects, the appropriate use of his medications, realistic expectations and the goals of pain management (increased in functionality).  Provider-requested follow-up: Return in about 3 months (around 08/31/2018) for Medication Management.  Future Appointments  Date Time Provider [place]  08/19/2018  8:30 AM Gillis Santa, MD [facility] None    Primary Care Physician: Birdie Sons, MD Location: [facility] Note by: Gillis Santa, M.D Date: 06/01/2018; Time: 11:02 AM  There are no Patient Instructions on file for this visit.

## 2018-06-01 NOTE — Progress Notes (Signed)
Safety precautions to be maintained throughout the outpatient stay will include: orient to surroundings, keep bed in low position, maintain call bell within reach at all times, provide assistance with transfer out of bed and ambulation.  

## 2018-08-19 ENCOUNTER — Other Ambulatory Visit: Payer: Self-pay

## 2018-08-19 ENCOUNTER — Ambulatory Visit
Payer: PPO | Attending: Student in an Organized Health Care Education/Training Program | Admitting: Student in an Organized Health Care Education/Training Program

## 2018-08-19 ENCOUNTER — Encounter: Payer: Self-pay | Admitting: Student in an Organized Health Care Education/Training Program

## 2018-08-19 VITALS — BP 160/133 | HR 48 | Temp 97.9°F | Resp 18 | Ht 74.0 in | Wt 257.0 lb

## 2018-08-19 DIAGNOSIS — M51369 Other intervertebral disc degeneration, lumbar region without mention of lumbar back pain or lower extremity pain: Secondary | ICD-10-CM

## 2018-08-19 DIAGNOSIS — M5136 Other intervertebral disc degeneration, lumbar region: Secondary | ICD-10-CM | POA: Diagnosis not present

## 2018-08-19 DIAGNOSIS — M961 Postlaminectomy syndrome, not elsewhere classified: Secondary | ICD-10-CM | POA: Diagnosis not present

## 2018-08-19 DIAGNOSIS — I509 Heart failure, unspecified: Secondary | ICD-10-CM | POA: Diagnosis not present

## 2018-08-19 DIAGNOSIS — G894 Chronic pain syndrome: Secondary | ICD-10-CM | POA: Diagnosis not present

## 2018-08-19 DIAGNOSIS — M5416 Radiculopathy, lumbar region: Secondary | ICD-10-CM

## 2018-08-19 DIAGNOSIS — I4891 Unspecified atrial fibrillation: Secondary | ICD-10-CM

## 2018-08-19 DIAGNOSIS — I420 Dilated cardiomyopathy: Secondary | ICD-10-CM | POA: Diagnosis not present

## 2018-08-19 MED ORDER — GABAPENTIN 300 MG PO CAPS: 300.0000 mg | ORAL_CAPSULE | Freq: Two times a day (BID) | ORAL | 5 refills | Status: DC

## 2018-08-19 NOTE — Progress Notes (Signed)
Patient's Name: [name] OPIELA Sr.  MRN: [id number]  Referring Provider: Birdie Sons, MD  DOB: 11-12-1940  PCP: Birdie Sons, MD  DOS: 08/19/2018  Note by: Gillis Santa, MD  Service setting: Ambulatory outpatient  Specialty: Interventional Pain Management  Location: [facility] (AMB) Pain Management Facility    Patient type: Established   Primary Reason(s) for Visit: Encounter for prescription drug management. (Level of risk: moderate)  CC: Medication Refill (Gabapentin ); Back Pain; and Hip Pain  HPI  [name] is a 78 y.o. year old, male patient, who comes today for a medication management evaluation. He has Atrial fibrillation ([place]); Hyperlipidemia; Hypothyroidism; Hypertension; History of cardiac catheterization; Congestive cardiomyopathy ([place]); Allergic rhinitis; Arthritis; Congestive heart failure, severe ([place]); GERD (gastroesophageal reflux disease); Hiatal hernia; LBP (low back pain); Swelling of limb; Varicose veins of lower extremities with inflammation; Lumbar herniated disc; Pre-diabetes; Sebaceous cyst; Osteoarthritis of knee; and Primary osteoarthritis of right hip on their problem list. His primarily concern today is the Medication Refill (Gabapentin ); Back Pain; and Hip Pain  Pain Assessment: Location: Lower Back Radiating: Travels to right hip and at times both kness Onset: More than a month ago Duration: Chronic pain Quality: Constant Severity: 8 /10 (subjective, self-reported pain score)  Note: Reported level is inconsistent with clinical observations.                         When using our objective Pain Scale, levels between 6 and 10/10 are said to belong in an emergency room, as it progressively worsens from a 6/10, described as severely limiting, requiring emergency care not usually available at an outpatient pain management facility. At a 6/10 level, communication becomes difficult and requires great effort. Assistance to reach the emergency department may be  required. Facial flushing and profuse sweating along with potentially dangerous increases in heart rate and blood pressure will be evident. Effect on ADL: "I am able to do as much as I can" Timing: Constant Modifying factors: Gabapentin BP: (!) 160/133  HR: (!) 48(pt states normally has lower blood pressure)  Mr. Strength was last scheduled for an appointment on 05/04/2018 for medication management. During today's appointment we reviewed Mr. Prieto's chronic pain status, as well as his outpatient medication regimen.  Patient on nally dosed gabapentin 300 mg twice daily which he states is beneficial for his pain as well as insomnia.  He states that he is able to function without as much pain is able to fall asleep at night.  He is pleased with the relief that he is getting on gabapentin.  We will continue.  The patient  reports no history of drug use. His body mass index is 33 kg/m.  Further details on both, my assessment(s), as well as the proposed treatment plan, please see below.   Laboratory Chemistry  Inflammation Markers (CRP: Acute Phase) (ESR: Chronic Phase) No results found for: CRP, ESRSEDRATE, LATICACIDVEN                       Rheumatology Markers No results found for: RF, ANA, LABURIC, URICUR, LYMEIGGIGMAB, LYMEABIGMQN, HLAB27                      Renal Function Markers Lab Results  Component Value Date   BUN 18 04/07/2018   CREATININE 1.80 (H) 04/07/2018   BCR 10 04/07/2018   GFRAA 42 (L) 04/07/2018   GFRNONAA 36 (L) 04/07/2018  Hepatic Function Markers Lab Results  Component Value Date   AST 25 04/07/2018   ALT 24 04/07/2018   ALBUMIN 4.0 04/07/2018   ALKPHOS 67 04/07/2018                        Electrolytes Lab Results  Component Value Date   NA 140 04/07/2018   K 3.2 (L) 04/07/2018   CL 100 04/07/2018   CALCIUM 9.3 04/07/2018   MG 1.9 05/31/2015   PHOS 2.9 06/02/2016                        Neuropathy Markers Lab  Results  Component Value Date   HGBA1C 5.5 04/07/2018                        CNS Tests No results found for: COLORCSF, APPEARCSF, RBCCOUNTCSF, WBCCSF, POLYSCSF, LYMPHSCSF, EOSCSF, PROTEINCSF, GLUCCSF, JCVIRUS, CSFOLI, IGGCSF                      Bone Pathology Markers No results found for: VD25OH, [id number], [id number], [id number], 25OHVITD1, 25OHVITD2, 25OHVITD3, TESTOFREE, TESTOSTERONE                       Coagulation Parameters Lab Results  Component Value Date   PLT 197 03/19/2017                        Cardiovascular Markers Lab Results  Component Value Date   HGB 12.6 (L) 03/19/2017   HCT 37.6 (L) 03/19/2017                         CA Markers No results found for: CEA, CA125, LABCA2                      Note: Lab results reviewed.  Recent Diagnostic Imaging Results  MR Lumbar Spine Wo Contrast CLINICAL DATA:  Chronic Low back, right buttock, right hip and groin pain. Three prior back surgeries.  EXAM: MRI LUMBAR SPINE WITHOUT CONTRAST  TECHNIQUE: Multiplanar, multisequence MR imaging of the lumbar spine was performed. No intravenous contrast was administered.  COMPARISON:  None available  FINDINGS: Severe scoliosis and degenerative lumbar spondylosis with multilevel disc disease facet disease. The vertebral bodies demonstrate normal marrow signal. No bone lesions or acute fractures.  No significant paraspinal or retroperitoneal findings. Right renal cysts are noted. Tortuosity of the aorta.  T12-L1: Degenerate disc disease and facet disease but no focal disc protrusion, significant spinal or foraminal stenosis.  L1-2: Central disc protrusion with mass effect on the ventral thecal sac slightly asymmetric right with right lateral recess stenosis possibly affecting the right L2 nerve root. There are also short pedicles and facet disease contributing to spinal stenosis. Mild right foraminal stenosis.  L2-3 fall interbody cage fusion changes. Moderate  facet disease with mild bilateral lateral recess stenosis, right greater than left. No significant spinal stenosis.  L3-4: Bulging degenerated annulus with a focal central disc protrusion and mild mass effect on the ventral thecal sac. There is bilateral lateral recess stenosis and mild bilateral foraminal stenosis, right greater than left.  L4-5: Degenerative disc disease and facet disease but no disc protrusion. Moderate left foraminal stenosis and mild spinal stenosis.  L5-S1: Severe facet disease. No disc protrusions or spinal stenosis.  IMPRESSION: Severe scoliosis degenerative lumbar  spondylosis with multilevel disc disease facet disease.  Central disc protrusion at L1-2 slightly asymmetric right with right lateral recess stenosis possibly affecting the right L2 nerve root. There is also mild spinal and right foraminal stenosis at this level.  Bulging degenerated annulus and focal disc protrusion at L3-4 with mild bilateral lateral recess stenosis and mild bilateral foraminal stenosis, right greater than left.  Moderate left foraminal stenosis and mild spinal stenosis at L4-5.  Electronically Signed   By: Marijo Sanes M.D.   On: 09/27/2015 15:04  Complexity Note: Imaging results reviewed. Results shared with Mr. Nickson, using Layman's terms.                         Meds   Current Outpatient Medications:  .  amiodarone (PACERONE) 200 MG tablet, TAKE 1 TABLET (200 MG TOTAL) BY MOUTH ONCE DAILY., Disp: , Rfl: 0 .  furosemide (LASIX) 20 MG tablet, TAKE 1 TABLET(20 MG) BY MOUTH DAILY AS NEEDED FOR FLUID RETENTION, Disp: 30 tablet, Rfl: 3 .  gabapentin (NEURONTIN) 300 MG capsule, Take 1 capsule (300 mg total) by mouth 2 (two) times daily., Disp: 60 capsule, Rfl: 5 .  levothyroxine (SYNTHROID, LEVOTHROID) 100 MCG tablet, TAKE 1 TABLET(100 MCG) BY MOUTH DAILY, Disp: 90 tablet, Rfl: 4 .  lisinopril-hydrochlorothiazide (PRINZIDE,ZESTORETIC) 10-12.5 MG tablet, TAKE 1 TABLET  BY MOUTH EVERY DAY, Disp: 90 tablet, Rfl: 4 .  lovastatin (MEVACOR) 20 MG tablet, TAKE 1 TABLET BY MOUTH EVERY DAY, Disp: 90 tablet, Rfl: 4 .  metoprolol succinate (TOPROL-XL) 25 MG 24 hr tablet, 1 TABLET BY MOUTH ONCE A DAY, Disp: , Rfl: 0 .  XARELTO 20 MG TABS tablet, TAKE 1 TABLET BY MOUTH EVERY DAY, Disp: 30 tablet, Rfl: 12  ROS  Constitutional: Denies any fever or chills Gastrointestinal: No reported hemesis, hematochezia, vomiting, or acute GI distress Musculoskeletal: Denies any acute onset joint swelling, redness, loss of ROM, or weakness Neurological: No reported episodes of acute onset apraxia, aphasia, dysarthria, agnosia, amnesia, paralysis, loss of coordination, or loss of consciousness  Allergies  Mr. Wenner has No Known Allergies.  PFSH  Drug: Mr. Ream  reports no history of drug use. Alcohol:  reports no history of alcohol use. Tobacco:  reports that he quit smoking about 11 years ago. His smoking use included cigarettes. He has a 100.00 pack-year smoking history. He has never used smokeless tobacco. Medical:  has a past medical history of Allergy, History of chicken pox, Hyperlipidemia, and Hypertension. Surgical: Mr. Hernon  has a past surgical history that includes polyp excision (09/2005); Back surgery; doppler echocardiography (06/21/2013); Myocardial perfusion scan (10/2012); and Upper gastrointestinal endoscopy (03/27/2004). Family: family history includes Alzheimer's disease in his mother; Colon cancer in his brother; Heart attack in his father.  Constitutional Exam  General appearance: Well nourished, well developed, and well hydrated. In no apparent acute distress Vitals:   08/19/18 0810  BP: (!) 160/133  Pulse: (!) 48  Resp: 18  Temp: 97.9 F (36.6 C)  SpO2: 100%  Weight: 257 lb (116.6 kg)  Height: '6\' 2"'$  (1.88 m)   BMI Assessment: Estimated body mass index is 33 kg/m as calculated from the following:   Height as of this encounter: '6\' 2"'$  (7.90  m).   Weight as of this encounter: 257 lb (116.6 kg).  BMI interpretation table: BMI level Category Range association with higher incidence of chronic pain  <18 kg/m2 Underweight   18.5-24.9 kg/m2 Ideal body weight  25-29.9 kg/m2 Overweight Increased incidence by 20%  30-34.9 kg/m2 Obese (Class I) Increased incidence by 68%  35-39.9 kg/m2 Severe obesity (Class II) Increased incidence by 136%  >40 kg/m2 Extreme obesity (Class III) Increased incidence by 254%   Patient's current BMI Ideal Body weight  Body mass index is 33 kg/m. Ideal body weight: 82.2 kg (181 lb 3.5 oz) Adjusted ideal body weight: 95.9 kg (211 lb 8.5 oz)   BMI Readings from Last 4 Encounters:  08/19/18 33.00 kg/m  06/01/18 32.74 kg/m  05/04/18 32.61 kg/m  04/07/18 32.35 kg/m   Wt Readings from Last 4 Encounters:  08/19/18 257 lb (116.6 kg)  06/01/18 255 lb (115.7 kg)  05/04/18 254 lb (115.2 kg)  04/07/18 252 lb (114.3 kg)  Psych/Mental status: Alert, oriented x 3 (person, place, & time)       Eyes: PERLA Respiratory: No evidence of acute respiratory distress  Cervical Spine Area Exam  Skin & Axial Inspection: No masses, redness, edema, swelling, or associated skin lesions Alignment: Symmetrical Functional ROM: Unrestricted ROM      Stability: No instability detected Muscle Tone/Strength: Functionally intact. No obvious neuro-muscular anomalies detected. Sensory (Neurological): Unimpaired Palpation: No palpable anomalies              Upper Extremity (UE) Exam    Side: Right upper extremity  Side: Left upper extremity  Skin & Extremity Inspection: Skin color, temperature, and hair growth are WNL. No peripheral edema or cyanosis. No masses, redness, swelling, asymmetry, or associated skin lesions. No contractures.  Skin & Extremity Inspection: Skin color, temperature, and hair growth are WNL. No peripheral edema or cyanosis. No masses, redness, swelling, asymmetry, or associated skin lesions. No  contractures.  Functional ROM: Unrestricted ROM          Functional ROM: Unrestricted ROM          Muscle Tone/Strength: Functionally intact. No obvious neuro-muscular anomalies detected.  Muscle Tone/Strength: Functionally intact. No obvious neuro-muscular anomalies detected.  Sensory (Neurological): Unimpaired          Sensory (Neurological): Unimpaired          Palpation: No palpable anomalies              Palpation: No palpable anomalies              Provocative Test(s):  Phalen's test: deferred Tinel's test: deferred Apley's scratch test (touch opposite shoulder):  Action 1 (Across chest): deferred Action 2 (Overhead): deferred Action 3 (LB reach): deferred   Provocative Test(s):  Phalen's test: deferred Tinel's test: deferred Apley's scratch test (touch opposite shoulder):  Action 1 (Across chest): deferred Action 2 (Overhead): deferred Action 3 (LB reach): deferred    Thoracic Spine Area Exam  Skin & Axial Inspection: No masses, redness, or swelling Alignment: Symmetrical Functional ROM: Unrestricted ROM Stability: No instability detected Muscle Tone/Strength: Functionally intact. No obvious neuro-muscular anomalies detected. Sensory (Neurological): Unimpaired Muscle strength & Tone: No palpable anomalies   Lumbar Spine Area Exam  Skin & Axial Inspection: Well healed scar from previous spine surgery detected Alignment: Symmetrical Functional ROM: Decreased ROM       Stability: No instability detected Muscle Tone/Strength: Functionally intact. No obvious neuro-muscular anomalies detected. Sensory (Neurological): Dermatomal pain pattern and muscular skeletal Palpation: No palpable anomalies       Provocative Tests: Hyperextension/rotation test: (+) bilaterally for facet joint pain. Lumbar quadrant test (Kemp's test): (+) bilaterally for facet joint pain. Lateral bending test: (+) ipsilateral radicular pain, bilaterally. Positive for bilateral  foraminal  stenosis. Patrick's Maneuver: deferred today                   FABER test: deferred today                   S-I anterior distraction/compression test: deferred today         S-I lateral compression test: deferred today         S-I Thigh-thrust test: deferred today         S-I Gaenslen's test: deferred today          Gait & Posture Assessment  Ambulation: Limited Gait: Antalgic Posture: Difficulty standing up straight, due to pain    Lower Extremity Exam    Side: Right lower extremity  Side: Left lower extremity  Stability: No instability observed          Stability: No instability observed          Skin & Extremity Inspection: Skin color, temperature, and hair growth are WNL. No peripheral edema or cyanosis. No masses, redness, swelling, asymmetry, or associated skin lesions. No contractures.  Skin & Extremity Inspection: Skin color, temperature, and hair growth are WNL. No peripheral edema or cyanosis. No masses, redness, swelling, asymmetry, or associated skin lesions. No contractures.  Functional ROM: Unrestricted ROM                  Functional ROM: Unrestricted ROM                  Muscle Tone/Strength: Functionally intact. No obvious neuro-muscular anomalies detected.  Muscle Tone/Strength: Functionally intact. No obvious neuro-muscular anomalies detected.  Sensory (Neurological): Unimpaired        Sensory (Neurological): Unimpaired        DTR: Patellar: deferred today Achilles: deferred today Plantar: deferred today  DTR: Patellar: deferred today Achilles: deferred today Plantar: deferred today  Palpation: No palpable anomalies  Palpation: No palpable anomalies   Assessment  Primary Diagnosis & Pertinent Problem List: The primary encounter diagnosis was Failed back surgical syndrome. Diagnoses of Lumbar radiculopathy, chronic, Lumbar degenerative disc disease, Congestive cardiomyopathy ([place]), Congestive heart failure, severe ([place]), Atrial fibrillation, unspecified type ([place])  (On Xarelto), and Chronic pain syndrome were also pertinent to this visit.  Status Diagnosis  Controlled Controlled Controlled 1. Failed back surgical syndrome   2. Lumbar radiculopathy, chronic   3. Lumbar degenerative disc disease   4. Congestive cardiomyopathy ([place])   5. Congestive heart failure, severe ([place])   6. Atrial fibrillation, unspecified type ([place]) (On Xarelto)   7. Chronic pain syndrome      General Recommendations: The pain condition that the patient suffers from is best treated with a multidisciplinary approach that involves an increase in physical activity to prevent de-conditioning and worsening of the pain cycle, as well as psychological counseling (formal and/or informal) to address the co-morbid psychological affects of pain. Treatment will often involve judicious use of pain medications and interventional procedures to decrease the pain, allowing the patient to participate in the physical activity that will ultimately produce long-lasting pain reductions. The goal of the multidisciplinary approach is to return the patient to a higher level of overall function and to restore their ability to perform activities of daily living.  Patient is a pleasant 78 year old male with a history of 5 previous lumbar spine surgeries with associated left scar and granulation tissue at L3-L4 and L4-L5 as well as a left L5 nerve root particularly in the left L4-L5 subarticular  lateral recess.  Patient is status post multiple epidurals which he states were not very effective.  These were transforaminal L2-L3 epidurals.  His last one was October 2018.  Patient also has atrial fibrillation and is currently anticoagulated with Xarelto.  Also has cardiomyopathy.  Patient has tried opioids in the past and does not find them effective.  He wants to avoid.  NSAIDs are contraindicated given that patient has anticoagulation on board with Xarelto.  He previously had to stop Celebrex given GI bleed.  Will  avoid TCAs to minimize risk of confusion and anticholinergic side effects.   Patient on renally dosed gabapentin 300 mg twice daily which he states is beneficial for his pain as well as insomnia.  He states that he is able to function without as much pain is able to fall asleep at night.  He is pleased with the relief that he is getting on gabapentin.  We will continue.  Future considerations: Lyrica, Cymbalta, possible buprenorphine, TENS.  Not an interventional candidate, high risk to be off of his Xarelto.  Plan of Care  Pharmacotherapy (Medications Ordered): Meds ordered this encounter  Medications  . gabapentin (NEURONTIN) 300 MG capsule    Sig: Take 1 capsule (300 mg total) by mouth 2 (two) times daily.    Dispense:  60 capsule    Refill:  5   Time Note: Greater than 50% of the 25 minute(s) of face-to-face time spent with Mr. Encarnacion, was spent in counseling/coordination of care regarding: Mr. Avey's primary cause of pain, the treatment plan, medication side effects, the appropriate use of his medications, realistic expectations and the goals of pain management (increased in functionality).  Provider-requested follow-up: Return in about 6 months (around 02/17/2019) for Medication Management.  Future Appointments  Date Time Provider [place]  09/16/2018  9:20 AM [facility]-NURSE HEALTH ADVISOR [facility] None  09/16/2018 10:00 AM Birdie Sons, MD [facility] None  02/17/2019  8:30 AM Gillis Santa, MD [facility] None    Primary Care Physician: Birdie Sons, MD Location: [facility] Note by: Gillis Santa, M.D Date: 08/19/2018; Time: 10:58 AM  There are no Patient Instructions on file for this visit.

## 2018-08-19 NOTE — Progress Notes (Signed)
Safety precautions to be maintained throughout the outpatient stay will include: orient to surroundings, keep bed in low position, maintain call bell within reach at all times, provide assistance with transfer out of bed and ambulation.  

## 2018-09-16 ENCOUNTER — Encounter: Payer: PPO | Admitting: Family Medicine

## 2018-09-16 ENCOUNTER — Ambulatory Visit: Payer: PPO

## 2018-09-27 ENCOUNTER — Ambulatory Visit: Payer: PPO

## 2018-10-15 ENCOUNTER — Telehealth: Payer: Self-pay

## 2018-10-15 NOTE — Telephone Encounter (Signed)
Patient is calling office to check on status of handicapped form. He request call back from [place] when available. KW

## 2018-10-18 NOTE — Telephone Encounter (Signed)
Is done

## 2018-10-20 DIAGNOSIS — I10 Essential (primary) hypertension: Secondary | ICD-10-CM | POA: Diagnosis not present

## 2018-10-20 DIAGNOSIS — I480 Paroxysmal atrial fibrillation: Secondary | ICD-10-CM | POA: Diagnosis not present

## 2018-10-20 DIAGNOSIS — I5022 Chronic systolic (congestive) heart failure: Secondary | ICD-10-CM | POA: Diagnosis not present

## 2018-10-20 DIAGNOSIS — E785 Hyperlipidemia, unspecified: Secondary | ICD-10-CM | POA: Diagnosis not present

## 2018-10-20 DIAGNOSIS — R0602 Shortness of breath: Secondary | ICD-10-CM | POA: Diagnosis not present

## 2018-10-20 DIAGNOSIS — I420 Dilated cardiomyopathy: Secondary | ICD-10-CM | POA: Diagnosis not present

## 2018-10-20 DIAGNOSIS — Z9889 Other specified postprocedural states: Secondary | ICD-10-CM | POA: Diagnosis not present

## 2018-11-26 ENCOUNTER — Other Ambulatory Visit: Payer: Self-pay | Admitting: Family Medicine

## 2018-12-02 DIAGNOSIS — I420 Dilated cardiomyopathy: Secondary | ICD-10-CM | POA: Diagnosis not present

## 2018-12-02 DIAGNOSIS — I5022 Chronic systolic (congestive) heart failure: Secondary | ICD-10-CM | POA: Diagnosis not present

## 2018-12-02 DIAGNOSIS — Z79899 Other long term (current) drug therapy: Secondary | ICD-10-CM | POA: Diagnosis not present

## 2018-12-02 DIAGNOSIS — I509 Heart failure, unspecified: Secondary | ICD-10-CM | POA: Diagnosis not present

## 2018-12-02 DIAGNOSIS — I480 Paroxysmal atrial fibrillation: Secondary | ICD-10-CM | POA: Diagnosis not present

## 2018-12-22 ENCOUNTER — Telehealth: Payer: Self-pay | Admitting: Family Medicine

## 2018-12-22 ENCOUNTER — Telehealth: Payer: Self-pay

## 2018-12-22 MED ORDER — LEVOTHYROXINE SODIUM 100 MCG PO TABS: ORAL_TABLET | ORAL | 5 refills | Status: DC

## 2018-12-22 NOTE — Telephone Encounter (Signed)
Patient states she found the Levothyroxine  under his bed that he thought he lost.

## 2018-12-22 NOTE — Telephone Encounter (Signed)
Pt has lost his levothyroxine (SYNTHROID) 100 MCG tablet he picked up on June 12th.  Wanting to know if Dr. Caryn Section can send in another Rx to [place], [place] [phone] (Phone) [phone] (Fax)   Please advise.  Thanks, [employer]

## 2019-01-26 ENCOUNTER — Other Ambulatory Visit: Payer: Self-pay | Admitting: Student in an Organized Health Care Education/Training Program

## 2019-01-27 ENCOUNTER — Telehealth: Payer: Self-pay | Admitting: Family Medicine

## 2019-01-27 NOTE — Telephone Encounter (Signed)
[facility] sent fax stating that they are out of lisinopril-hydrochlorothiazide (PRINZIDE,ZESTORETIC) 10-12.5 MG tablet and request a new Rx for both lisinopril & hydrochlorothiazide so they can fill the medication individually instead of the combined medication. Please advise. Thanks TNP

## 2019-01-28 ENCOUNTER — Other Ambulatory Visit: Payer: Self-pay | Admitting: Family Medicine

## 2019-01-28 MED ORDER — LISINOPRIL 10 MG PO TABS: 10.0000 mg | ORAL_TABLET | Freq: Every day | ORAL | 3 refills | Status: DC

## 2019-01-28 MED ORDER — HYDROCHLOROTHIAZIDE 12.5 MG PO CAPS: 12.5000 mg | ORAL_CAPSULE | Freq: Every day | ORAL | 3 refills | Status: DC

## 2019-01-28 NOTE — Progress Notes (Signed)
Lisinopril-hctz on backorder per [facility].

## 2019-02-16 ENCOUNTER — Encounter: Payer: Self-pay | Admitting: Student in an Organized Health Care Education/Training Program

## 2019-02-16 NOTE — Progress Notes (Signed)
Subjective:   [name] LAHAYE Sr. is a 78 y.o. male who presents for Medicare Annual/Subsequent preventive examination.    This visit is being conducted through telemedicine due to the COVID-19 pandemic. This patient has given me verbal consent via doximity to conduct this visit, patient states they are participating from their home address. Some vital signs may be absent or patient reported.    Patient identification: identified by name, DOB, and current address  Review of Systems:  N/A  Cardiac Risk Factors include: advanced age (>69men, >61 women);dyslipidemia;male gender;hypertension     Objective:    Vitals: There were no vitals taken for this visit.  There is no height or weight on file to calculate BMI. Unable to obtain vitals due to visit being conducted via telephonically.   Advanced Directives 02/17/2019 08/19/2018 06/01/2018 05/04/2018 07/07/2017 10/20/2016  Does Patient Have a Medical Advance Directive? Yes Yes Yes Yes Yes No  Type of Paramedic of [place];Living will [place];Living will [place];Living will [place];Living will [place];Living will;Out of facility DNR (pink MOST or yellow form) -  Copy of [place] in Chart? No - copy requested No - copy requested - Yes Yes -  Would patient like information on creating a medical advance directive? - - - - - No - Patient declined    Tobacco Social History   Tobacco Use  Smoking Status Former Smoker   Packs/day: 2.00   Years: 50.00   Pack years: 100.00   Types: Cigarettes   Quit date: 07/01/2007   Years since quitting: 11.6  Smokeless Tobacco Never Used     Counseling given: Not Answered   Clinical Intake:  Pre-visit preparation completed: Yes  Pain : No/denies pain Pain Score: 0-No pain     Nutritional Risks: None Diabetes: No  How often do you need to have someone help you  when you read instructions, pamphlets, or other written materials from your doctor or pharmacy?: 1 - Never  Interpreter Needed?: No  Information entered by :: [facility], LPN  Past Medical History:  Diagnosis Date   Allergy    History of chicken pox    Hyperlipidemia    Hypertension    Past Surgical History:  Procedure Laterality Date   BACK SURGERY     x5   DOPPLER ECHOCARDIOGRAPHY  06/21/2013   EF=35% while in Sinus Rhythm   Myocardial perfusion scan  10/2012   severe global LV enlargement. Mild RV enlargement. Mild LVH. Mild mitral and tricuspid insufficency   polyp excision  09/2005   UPPER GASTROINTESTINAL ENDOSCOPY  03/27/2004   Gastropathy, Gastritis; Duodenopathy   Family History  Problem Relation Age of Onset   Alzheimer's disease Mother    Heart attack Father    Colon cancer Brother    Social History   Socioeconomic History   Marital status: Widowed    Spouse name: Not on file   Number of children: 2   Years of education: Not on file   Highest education level: 12th grade  Occupational History   Occupation: Retired/ Disabled  Scientist, product/process development strain: Not hard at all   Food insecurity    Worry: Never true    Inability: Never true   Transportation needs    Medical: Yes    Non-medical: Yes  Tobacco Use   Smoking status: Former Smoker    Packs/day: 2.00    Years: 50.00  Pack years: 100.00    Types: Cigarettes    Quit date: 07/01/2007    Years since quitting: 11.6   Smokeless tobacco: Never Used  Substance and Sexual Activity   Alcohol use: No    Alcohol/week: 0.0 standard drinks   Drug use: No   Sexual activity: Not on file  Lifestyle   Physical activity    Days per week: 0 days    Minutes per session: 0 min   Stress: Not at all  Relationships   Social connections    Talks on phone: Patient refused    Gets together: Patient refused    Attends religious service: Patient refused    Active member of  club or organization: Patient refused    Attends meetings of clubs or organizations: Patient refused    Relationship status: Patient refused  Other Topics Concern   Not on file  Social History Narrative   Not on file    Outpatient Encounter Medications as of 02/17/2019  Medication Sig   amiodarone (PACERONE) 200 MG tablet TAKE 1 TABLET (200 MG TOTAL) BY MOUTH ONCE DAILY.   furosemide (LASIX) 20 MG tablet TAKE 1 TABLET(20 MG) BY MOUTH DAILY AS NEEDED FOR FLUID RETENTION   hydrochlorothiazide (MICROZIDE) 12.5 MG capsule Take 1 capsule (12.5 mg total) by mouth daily.   levothyroxine (SYNTHROID) 100 MCG tablet TAKE 1 TABLET(100 MCG) BY MOUTH DAILY   lisinopril (ZESTRIL) 10 MG tablet Take 1 tablet (10 mg total) by mouth daily.   lovastatin (MEVACOR) 20 MG tablet TAKE 1 TABLET BY MOUTH EVERY DAY   metoprolol succinate (TOPROL-XL) 25 MG 24 hr tablet 1 TABLET BY MOUTH ONCE A DAY   XARELTO 20 MG TABS tablet TAKE 1 TABLET BY MOUTH EVERY DAY   [DISCONTINUED] gabapentin (NEURONTIN) 300 MG capsule Take 1 capsule (300 mg total) by mouth 2 (two) times daily.   No facility-administered encounter medications on file as of 02/17/2019.     Activities of Daily Living In your present state of health, do you have any difficulty performing the following activities: 02/17/2019  Hearing? N  Vision? N  Difficulty concentrating or making decisions? N  Walking or climbing stairs? Y  Comment Due left leg pains.  Dressing or bathing? N  Doing errands, shopping? N  Preparing Food and eating ? N  Using the Toilet? N  In the past six months, have you accidently leaked urine? N  Do you have problems with loss of bowel control? N  Managing your Medications? N  Managing your Finances? N  Housekeeping or managing your Housekeeping? N  Some recent data might be hidden    Patient Care Team: Birdie Sons, MD as PCP - General (Family Medicine) Isaias Cowman, MD as Consulting Physician  (Cardiology) Gillis Santa, MD as Consulting Physician (Pain Medicine)   Assessment:   This is a routine wellness examination for [name].  Exercise Activities and Dietary recommendations Current Exercise Habits: The patient does not participate in regular exercise at present, Exercise limited by: None identified  Goals     DIET - INCREASE WATER INTAKE     Recommend to drink at least 6-8 8oz glasses of water per day.     DIET - REDUCE SUGAR INTAKE     Recommend to decrease or completely cut out all extra sugars in diet.        Fall Risk: Fall Risk  02/17/2019 08/19/2018 06/01/2018 05/04/2018 07/07/2017  Falls in the past year? 0 0 0 0 No  Number  falls in past yr: - 0 - - -  Injury with Fall? - 0 - - -    FALL RISK PREVENTION PERTAINING TO THE HOME:  Any stairs in or around the home? No  If so, are there any without handrails? N/A  Home free of loose throw rugs in walkways, pet beds, electrical cords, etc? Yes  Adequate lighting in your home to reduce risk of falls? Yes   ASSISTIVE DEVICES UTILIZED TO PREVENT FALLS:  Life alert? No  Use of a cane, walker or w/c? Yes  Grab bars in the bathroom? Yes  Shower chair or bench in shower? No  Elevated toilet seat or a handicapped toilet? Yes   TIMED UP AND GO:  Was the test performed? No .    Depression Screen PHQ 2/9 Scores 02/17/2019 08/19/2018 06/01/2018 05/04/2018  PHQ - 2 Score 0 0 0 0  PHQ- 9 Score - - - -    Cognitive Function: Declined today.        Immunization History  Administered Date(s) Administered   Influenza Split 03/22/2012   Influenza, High Dose Seasonal PF 05/23/2014, 06/04/2015, 03/18/2017, 03/17/2018   Pneumococcal Conjugate-13 05/23/2014   Pneumococcal Polysaccharide-23 06/04/2015   Tdap 04/21/2014    Qualifies for Shingles Vaccine? Yes . Due for Shingrix. Education has been provided regarding the importance of this vaccine. Pt has been advised to call insurance company to determine out of  pocket expense. Advised may also receive vaccine at local pharmacy or Health Dept. Verbalized acceptance and understanding.  Tdap: Up to date  Flu Vaccine: Due fall 2020  Pneumococcal Vaccine: Completed series  Screening Tests Health Maintenance  Topic Date Due   INFLUENZA VACCINE  01/29/2019   TETANUS/TDAP  04/21/2024   PNA vac Low Risk Adult  Completed   Cancer Screenings:  Colorectal Screening: No longer required.   Lung Cancer Screening: (Low Dose CT Chest recommended if Age 79-80 years, 30 pack-year currently smoking OR have quit w/in 15years.) does not qualify.   Additional Screening:  Vision Screening: Recommended annual ophthalmology exams for early detection of glaucoma and other disorders of the eye.  Dental Screening: Recommended annual dental exams for proper oral hygiene  Community Resource Referral:  CRR required this visit?  No        Plan:  I have personally reviewed and addressed the Medicare Annual Wellness questionnaire and have noted the following in the patients chart:  A. Medical and social history B. Use of alcohol, tobacco or illicit drugs  C. Current medications and supplements D. Functional ability and status E.  Nutritional status F.  Physical activity G. Advance directives H. List of other physicians I.  Hospitalizations, surgeries, and ER visits in previous 12 months J.  [place] such as hearing and vision if needed, cognitive and depression L. Referrals and appointments   In addition, I have reviewed and discussed with patient certain preventive protocols, quality metrics, and best practice recommendations. A written personalized care plan for preventive services as well as general preventive health recommendations were provided to patient.   Glendora Score, LPN  [id number] Nurse Health Advisor   Nurse Notes: Flu vaccine due this fall.

## 2019-02-17 ENCOUNTER — Ambulatory Visit (INDEPENDENT_AMBULATORY_CARE_PROVIDER_SITE_OTHER): Payer: PPO

## 2019-02-17 ENCOUNTER — Encounter: Payer: Self-pay | Admitting: Student in an Organized Health Care Education/Training Program

## 2019-02-17 ENCOUNTER — Ambulatory Visit
Payer: PPO | Attending: Student in an Organized Health Care Education/Training Program | Admitting: Student in an Organized Health Care Education/Training Program

## 2019-02-17 ENCOUNTER — Other Ambulatory Visit: Payer: Self-pay

## 2019-02-17 DIAGNOSIS — M961 Postlaminectomy syndrome, not elsewhere classified: Secondary | ICD-10-CM

## 2019-02-17 DIAGNOSIS — I420 Dilated cardiomyopathy: Secondary | ICD-10-CM | POA: Diagnosis not present

## 2019-02-17 DIAGNOSIS — Z0000 Encounter for general adult medical examination without abnormal findings: Secondary | ICD-10-CM | POA: Diagnosis not present

## 2019-02-17 DIAGNOSIS — M5416 Radiculopathy, lumbar region: Secondary | ICD-10-CM

## 2019-02-17 DIAGNOSIS — I4891 Unspecified atrial fibrillation: Secondary | ICD-10-CM | POA: Diagnosis not present

## 2019-02-17 DIAGNOSIS — I509 Heart failure, unspecified: Secondary | ICD-10-CM

## 2019-02-17 DIAGNOSIS — G894 Chronic pain syndrome: Secondary | ICD-10-CM | POA: Diagnosis not present

## 2019-02-17 DIAGNOSIS — M5136 Other intervertebral disc degeneration, lumbar region: Secondary | ICD-10-CM | POA: Diagnosis not present

## 2019-02-17 MED ORDER — GABAPENTIN 300 MG PO CAPS: 300.0000 mg | ORAL_CAPSULE | Freq: Two times a day (BID) | ORAL | 5 refills | Status: DC

## 2019-02-17 NOTE — Patient Instructions (Signed)
[name] , Thank you for taking time to come for your Medicare Wellness Visit. I appreciate your ongoing commitment to your health goals. Please review the following plan we discussed and let me know if I can assist you in the future.   Screening recommendations/referrals: Colonoscopy: No longer required.  Recommended yearly ophthalmology/optometry visit for glaucoma screening and checkup Recommended yearly dental visit for hygiene and checkup  Vaccinations: Influenza vaccine: Due fall 2020 Pneumococcal vaccine: Completed series Tdap vaccine: Up to date, due 03/2024 Shingles vaccine: Pt declines today.     Advanced directives: Currently on file.  Conditions/risks identified: Recommend to drink at least 6-8 8oz glasses of water per day.  Next appointment: Declined scheduling a CPE this year or an AWV for 2021 at this time.  Preventive Care 76 Years and Older, Male Preventive care refers to lifestyle choices and visits with your health care provider that can promote health and wellness. What does preventive care include?  A yearly physical exam. This is also called an annual well check.  Dental exams once or twice a year.  Routine eye exams. Ask your health care provider how often you should have your eyes checked.  Personal lifestyle choices, including:  Daily care of your teeth and gums.  Regular physical activity.  Eating a healthy diet.  Avoiding tobacco and drug use.  Limiting alcohol use.  Practicing safe sex.  Taking low doses of aspirin every day.  Taking vitamin and mineral supplements as recommended by your health care provider. What happens during an annual well check? The services and screenings done by your health care provider during your annual well check will depend on your age, overall health, lifestyle risk factors, and family history of disease. Counseling  Your health care provider may ask you questions about your:  Alcohol use.  Tobacco  use.  Drug use.  Emotional well-being.  Home and relationship well-being.  Sexual activity.  Eating habits.  History of falls.  Memory and ability to understand (cognition).  Work and work Statistician. Screening  You may have the following tests or measurements:  Height, weight, and BMI.  Blood pressure.  Lipid and cholesterol levels. These may be checked every 5 years, or more frequently if you are over 76 years old.  Skin check.  Lung cancer screening. You may have this screening every year starting at age 76 if you have a 30-pack-year history of smoking and currently smoke or have quit within the past 76 years.  Fecal occult blood test (FOBT) of the stool. You may have this test every year starting at age 76.  Flexible sigmoidoscopy or colonoscopy. You may have a sigmoidoscopy every 5 years or a colonoscopy every 10 years starting at age 76.  Prostate cancer screening. Recommendations will vary depending on your family history and other risks.  Hepatitis C blood test.  Hepatitis B blood test.  Sexually transmitted disease (STD) testing.  Diabetes screening. This is done by checking your blood sugar (glucose) after you have not eaten for a while (fasting). You may have this done every 1-3 years.  Abdominal aortic aneurysm (AAA) screening. You may need this if you are a current or former smoker.  Osteoporosis. You may be screened starting at age 76 if you are at high risk. Talk with your health care provider about your test results, treatment options, and if necessary, the need for more tests. Vaccines  Your health care provider may recommend certain vaccines, such as:  Influenza vaccine. This is  recommended every year.  Tetanus, diphtheria, and acellular pertussis (Tdap, Td) vaccine. You may need a Td booster every 10 years.  Zoster vaccine. You may need this after age 76.  Pneumococcal 13-valent conjugate (PCV13) vaccine. One dose is recommended after age  76.  Pneumococcal polysaccharide (PPSV23) vaccine. One dose is recommended after age 76. Talk to your health care provider about which screenings and vaccines you need and how often you need them. This information is not intended to replace advice given to you by your health care provider. Make sure you discuss any questions you have with your health care provider. Document Released: 07/13/2015 Document Revised: 03/05/2016 Document Reviewed: 04/17/2015 Elsevier Interactive Patient Education  2017 [place] Prevention in the Home Falls can cause injuries. They can happen to people of all ages. There are many things you can do to make your home safe and to help prevent falls. What can I do on the outside of my home?  Regularly fix the edges of walkways and driveways and fix any cracks.  Remove anything that might make you trip as you walk through a door, such as a raised step or threshold.  Trim any bushes or trees on the path to your home.  Use bright outdoor lighting.  Clear any walking paths of anything that might make someone trip, such as rocks or tools.  Regularly check to see if handrails are loose or broken. Make sure that both sides of any steps have handrails.  Any raised decks and porches should have guardrails on the edges.  Have any leaves, snow, or ice cleared regularly.  Use sand or salt on walking paths during winter.  Clean up any spills in your garage right away. This includes oil or grease spills. What can I do in the bathroom?  Use night lights.  Install grab bars by the toilet and in the tub and shower. Do not use towel bars as grab bars.  Use non-skid mats or decals in the tub or shower.  If you need to sit down in the shower, use a plastic, non-slip stool.  Keep the floor dry. Clean up any water that spills on the floor as soon as it happens.  Remove soap buildup in the tub or shower regularly.  Attach bath mats securely with double-sided  non-slip rug tape.  Do not have throw rugs and other things on the floor that can make you trip. What can I do in the bedroom?  Use night lights.  Make sure that you have a light by your bed that is easy to reach.  Do not use any sheets or blankets that are too big for your bed. They should not hang down onto the floor.  Have a firm chair that has side arms. You can use this for support while you get dressed.  Do not have throw rugs and other things on the floor that can make you trip. What can I do in the kitchen?  Clean up any spills right away.  Avoid walking on wet floors.  Keep items that you use a lot in easy-to-reach places.  If you need to reach something above you, use a strong step stool that has a grab bar.  Keep electrical cords out of the way.  Do not use floor polish or wax that makes floors slippery. If you must use wax, use non-skid floor wax.  Do not have throw rugs and other things on the floor that can make you trip.  What can I do with my stairs?  Do not leave any items on the stairs.  Make sure that there are handrails on both sides of the stairs and use them. Fix handrails that are broken or loose. Make sure that handrails are as long as the stairways.  Check any carpeting to make sure that it is firmly attached to the stairs. Fix any carpet that is loose or worn.  Avoid having throw rugs at the top or bottom of the stairs. If you do have throw rugs, attach them to the floor with carpet tape.  Make sure that you have a light switch at the top of the stairs and the bottom of the stairs. If you do not have them, ask someone to add them for you. What else can I do to help prevent falls?  Wear shoes that:  Do not have high heels.  Have rubber bottoms.  Are comfortable and fit you well.  Are closed at the toe. Do not wear sandals.  If you use a stepladder:  Make sure that it is fully opened. Do not climb a closed stepladder.  Make sure that both  sides of the stepladder are locked into place.  Ask someone to hold it for you, if possible.  Clearly mark and make sure that you can see:  Any grab bars or handrails.  First and last steps.  Where the edge of each step is.  Use tools that help you move around (mobility aids) if they are needed. These include:  Canes.  Walkers.  Scooters.  Crutches.  Turn on the lights when you go into a dark area. Replace any light bulbs as soon as they burn out.  Set up your furniture so you have a clear path. Avoid moving your furniture around.  If any of your floors are uneven, fix them.  If there are any pets around you, be aware of where they are.  Review your medicines with your doctor. Some medicines can make you feel dizzy. This can increase your chance of falling. Ask your doctor what other things that you can do to help prevent falls. This information is not intended to replace advice given to you by your health care provider. Make sure you discuss any questions you have with your health care provider. Document Released: 04/12/2009 Document Revised: 11/22/2015 Document Reviewed: 07/21/2014 Elsevier Interactive Patient Education  2017 [employer].

## 2019-02-17 NOTE — Progress Notes (Signed)
Pain Management Virtual Encounter Note - Virtual Visit via Telephone Telehealth (real-time audio visits between healthcare provider and patient).   Patient's Phone No. & Preferred Pharmacy:  [phone] (home); [phone] (mobile); (Preferred) [phone] No e-mail address on record  [facility] [place], [place] [address] Phone: [phone] Fax: [phone]  [facility] [id number], [place] [address] Phone: [phone] Fax: [phone]  [facility] [place], [place] - [address] AT [place] [address] Phone: [phone] Fax: [phone]    Pre-screening note:  Our staff contacted [name] and offered him an "in person", "face-to-face" appointment versus a telephone encounter. He indicated preferring the telephone encounter, at this time.   Reason for Virtual Visit: COVID-19*  Social distancing based on CDC and AMA recommendations.   I contacted [name] WILKINS Sr. on 02/17/2019 via telephone.      I clearly identified myself as Gillis Santa, MD. I verified that I was speaking with the correct person using two identifiers (Name: [name] GRAP Sr., and date of birth: Oct 08, 1940).  Advanced Informed Consent I sought verbal advanced consent from [name] for virtual visit interactions. I informed [name] of possible security and privacy concerns, risks, and limitations associated with providing "not-in-person" medical evaluation and management services. I also informed [name] of the availability of "in-person" appointments. Finally, I informed him that there would be a charge for the virtual visit and that he could be  personally, fully or partially, financially responsible for it. [name] expressed understanding and agreed to proceed.   Historic Elements   [name] is a 78 y.o. year old, male patient evaluated today after his last encounter by our practice on 01/26/2019. [name]  has a past medical history of Allergy, History of chicken pox, Hyperlipidemia, and Hypertension. He also  has a past surgical history that includes polyp excision (09/2005); Back surgery; doppler echocardiography (06/21/2013); Myocardial perfusion scan (10/2012); and Upper gastrointestinal endoscopy (03/27/2004). [name] has a current medication list which includes the following prescription(s): amiodarone, furosemide, gabapentin, hydrochlorothiazide, levothyroxine, lisinopril, lovastatin, metoprolol succinate, and xarelto. He  reports that he quit smoking about 11 years ago. His smoking use included cigarettes. He has a 100.00 pack-year smoking history. He has never used smokeless tobacco. He reports that he does not drink alcohol or use drugs. [name] has No Known Allergies.   HPI  Today, he is being contacted for medication management.   No change in medical history since last visit.  Patient's pain is at baseline.  Patient continues multimodal pain regimen as prescribed.  States that Gabapentin 300 mg BID provides pain relief and improvement in functional status. Future considerations could include titration of gabapentin to 600 mg twice daily, renally dosed.  Patient's last creatinine was 1.80.  UDS:  Summary  Date Value Ref Range Status  05/04/2018 FINAL  Final    Comment:    ==================================================================== TOXASSURE COMP DRUG ANALYSIS,UR ==================================================================== Test                             Result       Flag       Units Drug Present and  Declared for Prescription Verification   Tramadol                       2176         EXPECTED   ng/mg creat   O-Desmethyltramadol             1533         EXPECTED   ng/mg creat   N-Desmethyltramadol            366          EXPECTED   ng/mg creat    Source of tramadol is a prescription medication.    O-desmethyltramadol and N-desmethyltramadol are expected    metabolites of tramadol.   Metoprolol                     PRESENT      EXPECTED Drug Present not Declared for Prescription Verification   Acetaminophen                  PRESENT      UNEXPECTED   Ibuprofen                      PRESENT      UNEXPECTED   Naproxen                       PRESENT      UNEXPECTED Drug Absent but Declared for Prescription Verification   Gabapentin                     Not Detected UNEXPECTED ==================================================================== Test                      Result    Flag   Units      Ref Range   Creatinine              85               mg/dL      >=20 ==================================================================== Declared Medications:  The flagging and interpretation on this report are based on the  following declared medications.  Unexpected results may arise from  inaccuracies in the declared medications.  **Note: The testing scope of this panel includes these medications:  Gabapentin  Metoprolol  Tramadol (Ultram)  **Note: The testing scope of this panel does not include following  reported medications:  Amiodarone  Furosemide (Lasix)  Hydrochlorothiazide (Lisinopril-HCTZ)  Levothyroxine  Lisinopril (Lisinopril-HCTZ)  Lovastatin  Rivaroxaban (Xarelto) ==================================================================== For clinical consultation, please call [phone]. ====================================================================    Laboratory Chemistry Profile (12 mo)  Renal: 04/07/2018: BUN 18; BUN/Creatinine Ratio 10; Creatinine, Ser 1.80  Lab Results  Component Value Date   GFRAA 42 (L) 04/07/2018   GFRNONAA 36 (L) 04/07/2018   Hepatic: 04/07/2018: Albumin 4.0 Lab Results   Component Value Date   AST 25 04/07/2018   ALT 24 04/07/2018   Other: No results found for requested labs within last 8760 hours. Note: Above Lab results reviewed.   Assessment  The primary encounter diagnosis was Failed back surgical syndrome. Diagnoses of Lumbar radiculopathy, chronic, Lumbar degenerative disc disease, Congestive cardiomyopathy ([place]), Congestive heart failure, severe ([place]), Atrial fibrillation, unspecified type ([place]) (On Xarelto), and Chronic pain syndrome were also pertinent to this visit.  Plan of Care  I am having [name] Friends Sr. maintain his metoprolol succinate, amiodarone, lovastatin, Xarelto, furosemide, levothyroxine, lisinopril,  hydrochlorothiazide, and gabapentin.  Pharmacotherapy (Medications Ordered): Meds ordered this encounter  Medications  . gabapentin (NEURONTIN) 300 MG capsule    Sig: Take 1 capsule (300 mg total) by mouth 2 (two) times daily.    Dispense:  60 capsule    Refill:  5   Orders:  No orders of the defined types were placed in this encounter.  Follow-up plan:   Return in about 6 months (around 08/20/2019) for Medication Management.    Recent Visits No visits were found meeting these conditions.  Showing recent visits within past 90 days and meeting all other requirements   Today's Visits Date Type Provider Dept  02/17/19 Office Visit Gillis Santa, MD [facility]  Showing today's visits and meeting all other requirements   Future Appointments No visits were found meeting these conditions.  Showing future appointments within next 90 days and meeting all other requirements   I discussed the assessment and treatment plan with the patient. The patient was provided an opportunity to ask questions and all were answered. The patient agreed with the plan and demonstrated an understanding of the instructions.  Patient advised to call back or seek an in-person evaluation if the symptoms or condition worsens.  Total  duration of non-face-to-face encounter: 4minutes.  Note by: Gillis Santa, MD Date: 02/17/2019; Time: 8:38 AM  Note: This dictation was prepared with Dragon dictation. Any transcriptional errors that may result from this process are unintentional.  Disclaimer:  * Given the special circumstances of the COVID-19 pandemic, the federal government has announced that the Office for Civil Rights (OCR) will exercise its enforcement discretion and will not impose penalties on physicians using telehealth in the event of noncompliance with regulatory requirements under the [place] (HIPAA) in connection with the good faith provision of telehealth during the XX123456 national public health emergency. ([place])

## 2019-05-23 DIAGNOSIS — I10 Essential (primary) hypertension: Secondary | ICD-10-CM | POA: Diagnosis not present

## 2019-05-23 DIAGNOSIS — I420 Dilated cardiomyopathy: Secondary | ICD-10-CM | POA: Diagnosis not present

## 2019-05-23 DIAGNOSIS — I5022 Chronic systolic (congestive) heart failure: Secondary | ICD-10-CM | POA: Diagnosis not present

## 2019-05-23 DIAGNOSIS — E785 Hyperlipidemia, unspecified: Secondary | ICD-10-CM | POA: Diagnosis not present

## 2019-05-23 DIAGNOSIS — I480 Paroxysmal atrial fibrillation: Secondary | ICD-10-CM | POA: Diagnosis not present

## 2019-05-23 DIAGNOSIS — R0602 Shortness of breath: Secondary | ICD-10-CM | POA: Diagnosis not present

## 2019-05-23 DIAGNOSIS — Z9889 Other specified postprocedural states: Secondary | ICD-10-CM | POA: Diagnosis not present

## 2019-08-12 DIAGNOSIS — Z79899 Other long term (current) drug therapy: Secondary | ICD-10-CM | POA: Diagnosis not present

## 2019-08-15 ENCOUNTER — Telehealth: Payer: Self-pay | Admitting: *Deleted

## 2019-08-15 ENCOUNTER — Encounter: Payer: Self-pay | Admitting: Student in an Organized Health Care Education/Training Program

## 2019-08-15 NOTE — Telephone Encounter (Signed)
Attempted to call for pre appointment review of allergies/meds. No answer after several rings, no voicemail.

## 2019-08-16 ENCOUNTER — Encounter: Payer: Self-pay | Admitting: Student in an Organized Health Care Education/Training Program

## 2019-08-16 ENCOUNTER — Other Ambulatory Visit: Payer: Self-pay

## 2019-08-16 ENCOUNTER — Ambulatory Visit
Payer: PPO | Attending: Student in an Organized Health Care Education/Training Program | Admitting: Student in an Organized Health Care Education/Training Program

## 2019-08-16 DIAGNOSIS — I4891 Unspecified atrial fibrillation: Secondary | ICD-10-CM

## 2019-08-16 DIAGNOSIS — M5416 Radiculopathy, lumbar region: Secondary | ICD-10-CM | POA: Diagnosis not present

## 2019-08-16 DIAGNOSIS — M961 Postlaminectomy syndrome, not elsewhere classified: Secondary | ICD-10-CM

## 2019-08-16 DIAGNOSIS — M51369 Other intervertebral disc degeneration, lumbar region without mention of lumbar back pain or lower extremity pain: Secondary | ICD-10-CM

## 2019-08-16 DIAGNOSIS — G894 Chronic pain syndrome: Secondary | ICD-10-CM | POA: Diagnosis not present

## 2019-08-16 DIAGNOSIS — I420 Dilated cardiomyopathy: Secondary | ICD-10-CM | POA: Diagnosis not present

## 2019-08-16 DIAGNOSIS — I509 Heart failure, unspecified: Secondary | ICD-10-CM

## 2019-08-16 DIAGNOSIS — M5136 Other intervertebral disc degeneration, lumbar region: Secondary | ICD-10-CM

## 2019-08-16 MED ORDER — GABAPENTIN 300 MG PO CAPS: 300.0000 mg | ORAL_CAPSULE | Freq: Two times a day (BID) | ORAL | 5 refills | Status: DC

## 2019-08-16 NOTE — Progress Notes (Signed)
Patient: [name] Friends Sr.  Service Category: E/M  Provider: Gillis Santa, MD  DOB: 1940-08-30  DOS: 08/16/2019  Location: Office  MRN: [id number]  Setting: Ambulatory outpatient  Referring Provider: Birdie Sons, MD  Type: Established Patient  Specialty: Interventional Pain Management  PCP: Birdie Sons, MD  Location: Home  Delivery: TeleHealth     Virtual Encounter - Pain Management PROVIDER NOTE: Information contained herein reflects review and annotations entered in association with encounter. Interpretation of such information and data should be left to medically-trained personnel. Information provided to patient can be located elsewhere in the medical record under "Patient Instructions". Document created using STT-dictation technology, any transcriptional errors that may result from process are unintentional.    Contact & Pharmacy Preferred: [phone] Home: [phone] (home) Mobile: [phone] (mobile) E-mail: No e-mail address on record  [facility] [place], [place] [address] Phone: [phone] Fax: [phone]  [facility] [id number], [place] [address] Phone: [phone] Fax: [phone]  [id number] [facility], [place] - [address] [place] [address] Phone: [phone] Fax: [phone]   Pre-screening  Mr. Sassone offered "in-person" vs "virtual" encounter. He indicated preferring virtual for this encounter.   Reason COVID-19*  Social distancing based on CDC and AMA recommendations.   I contacted [name] on 08/16/2019 via telephone.      I clearly identified myself as Gillis Santa, MD. I verified that I was speaking with the correct person using two identifiers (Name:  [name] DUPLANTIS Sr., and date of birth: 1940/07/28).  This visit was completed via telephone due to the restrictions of the COVID-19 pandemic. All issues as above were discussed and addressed but no physical exam was performed. If it was felt that the patient should be evaluated in the office, they were directed there. The patient verbally consented to this visit. Patient was unable to complete an audio/visual visit due to Technical difficulties and/or Lack of internet. Due to the catastrophic nature of the COVID-19 pandemic, this visit was done through audio contact only.  Location of the patient: home address (see Epic for details)  Location of the provider: office Consent I sought verbal advanced consent from [name]. for virtual visit interactions. I informed [name] of possible security and privacy concerns, risks, and limitations associated with providing "not-in-person" medical evaluation and management services. I also informed [name] of the availability of "in-person" appointments. Finally, I informed him that there would be a charge for the virtual visit and that he could be  personally, fully or partially, financially responsible for it. Mr. Downs expressed understanding and agreed to proceed.   Historic Elements   Mr. [name] NOBLET Sr. is a 79 y.o. year old, male patient evaluated today after his last contact with our practice on 08/15/2019. [name]  has a past medical history of Allergy, History of chicken pox, Hyperlipidemia, and Hypertension. He also  has a past surgical history that includes polyp excision (09/2005); Back surgery; doppler echocardiography (06/21/2013); Myocardial perfusion scan (10/2012); and Upper gastrointestinal endoscopy (03/27/2004). [name] has a current medication list which includes the following prescription(s): amiodarone, furosemide, gabapentin, hydrochlorothiazide, levothyroxine, lisinopril, lovastatin, metoprolol  succinate, and xarelto. He  reports that  he quit smoking about 12 years ago. His smoking use included cigarettes. He has a 100.00 pack-year smoking history. He has never used smokeless tobacco. He reports that he does not drink alcohol or use drugs. [name] has No Known Allergies.   HPI  Today, he is being contacted for medication management.   No change in medical history since last visit.  Patient's pain is at baseline.  Patient continues multimodal pain regimen as prescribed.  States that it provides pain relief and improvement in functional status.  UDS:  Summary  Date Value Ref Range Status  05/04/2018 FINAL  Final    Comment:    ==================================================================== TOXASSURE COMP DRUG ANALYSIS,UR ==================================================================== Test                             Result       Flag       Units Drug Present and Declared for Prescription Verification   Tramadol                       2176         EXPECTED   ng/mg creat   O-Desmethyltramadol            1533         EXPECTED   ng/mg creat   N-Desmethyltramadol            366          EXPECTED   ng/mg creat    Source of tramadol is a prescription medication.    O-desmethyltramadol and N-desmethyltramadol are expected    metabolites of tramadol.   Metoprolol                     PRESENT      EXPECTED Drug Present not Declared for Prescription Verification   Acetaminophen                  PRESENT      UNEXPECTED   Ibuprofen                      PRESENT      UNEXPECTED   Naproxen                       PRESENT      UNEXPECTED Drug Absent but Declared for Prescription Verification   Gabapentin                     Not Detected UNEXPECTED ==================================================================== Test                      Result    Flag   Units      Ref Range   Creatinine              85               mg/dL       >=20 ==================================================================== Declared Medications:  The flagging and interpretation on this report are based on the  following declared medications.  Unexpected results may arise from  inaccuracies in the declared medications.  **Note: The testing scope of this panel includes these medications:  Gabapentin  Metoprolol  Tramadol (Ultram)  **Note: The testing scope of this panel does not include following  reported medications:  Amiodarone  Furosemide (Lasix)  Hydrochlorothiazide (Lisinopril-HCTZ)  Levothyroxine  Lisinopril (Lisinopril-HCTZ)  Lovastatin  Rivaroxaban (Xarelto) ==================================================================== For clinical consultation, please call [phone]. ====================================================================    Laboratory Chemistry Profile   Renal Lab Results  Component Value Date   BUN 18 04/07/2018   CREATININE 1.80 (H) 04/07/2018   BCR 10 04/07/2018   GFRAA 42 (L) 04/07/2018   GFRNONAA 36 (L) 04/07/2018    Hepatic Lab Results  Component Value Date   AST 25 04/07/2018   ALT 24 04/07/2018   ALBUMIN 4.0 04/07/2018   ALKPHOS 67 04/07/2018    Electrolytes Lab Results  Component Value Date   NA 140 04/07/2018   K 3.2 (L) 04/07/2018   CL 100 04/07/2018   CALCIUM 9.3 04/07/2018   MG 1.9 05/31/2015   PHOS 2.9 06/02/2016    Bone No results found for: VD25OH, VD125OH2TOT, WO0321YY4, MG5003BC4, 25OHVITD1, 25OHVITD2, 25OHVITD3, TESTOFREE, TESTOSTERONE  Coagulation Lab Results  Component Value Date   PLT 197 03/19/2017    Cardiovascular Lab Results  Component Value Date   HGB 12.6 (L) 03/19/2017   HCT 37.6 (L) 03/19/2017    Inflammation (CRP: Acute Phase) (ESR: Chronic Phase) No results found for: CRP, ESRSEDRATE, LATICACIDVEN    Note: Above Lab results reviewed.  Imaging  MR Lumbar Spine Wo Contrast CLINICAL DATA:  Chronic Low back, right buttock, right hip  and groin pain. Three prior back surgeries.  EXAM: MRI LUMBAR SPINE WITHOUT CONTRAST  TECHNIQUE: Multiplanar, multisequence MR imaging of the lumbar spine was performed. No intravenous contrast was administered.  COMPARISON:  None available  FINDINGS: Severe scoliosis and degenerative lumbar spondylosis with multilevel disc disease facet disease. The vertebral bodies demonstrate normal marrow signal. No bone lesions or acute fractures.  No significant paraspinal or retroperitoneal findings. Right renal cysts are noted. Tortuosity of the aorta.  T12-L1: Degenerate disc disease and facet disease but no focal disc protrusion, significant spinal or foraminal stenosis.  L1-2: Central disc protrusion with mass effect on the ventral thecal sac slightly asymmetric right with right lateral recess stenosis possibly affecting the right L2 nerve root. There are also short pedicles and facet disease contributing to spinal stenosis. Mild right foraminal stenosis.  L2-3 fall interbody cage fusion changes. Moderate facet disease with mild bilateral lateral recess stenosis, right greater than left. No significant spinal stenosis.  L3-4: Bulging degenerated annulus with a focal central disc protrusion and mild mass effect on the ventral thecal sac. There is bilateral lateral recess stenosis and mild bilateral foraminal stenosis, right greater than left.  L4-5: Degenerative disc disease and facet disease but no disc protrusion. Moderate left foraminal stenosis and mild spinal stenosis.  L5-S1: Severe facet disease. No disc protrusions or spinal stenosis.  IMPRESSION: Severe scoliosis degenerative lumbar spondylosis with multilevel disc disease facet disease.  Central disc protrusion at L1-2 slightly asymmetric right with right lateral recess stenosis possibly affecting the right L2 nerve root. There is also mild spinal and right foraminal stenosis at this level.  Bulging degenerated  annulus and focal disc protrusion at L3-4 with mild bilateral lateral recess stenosis and mild bilateral foraminal stenosis, right greater than left.  Moderate left foraminal stenosis and mild spinal stenosis at L4-5.  Electronically Signed   By: Marijo Sanes M.D.   On: 09/27/2015 15:04  Assessment  The primary encounter diagnosis was Failed back surgical syndrome. Diagnoses of Lumbar radiculopathy, chronic, Lumbar degenerative disc disease, Congestive cardiomyopathy ([place]), Congestive heart failure, severe ([place]), Atrial fibrillation, unspecified type ([place]) (On Xarelto), and Chronic pain syndrome were also pertinent to this  visit.  Plan of Care  I am having [name] Friends Sr. maintain his metoprolol succinate, amiodarone, lovastatin, Xarelto, furosemide, levothyroxine, lisinopril, hydrochlorothiazide, and gabapentin.  Pharmacotherapy (Medications Ordered): Meds ordered this encounter  Medications  . gabapentin (NEURONTIN) 300 MG capsule    Sig: Take 1 capsule (300 mg total) by mouth 2 (two) times daily.    Dispense:  60 capsule    Refill:  5    Follow-up plan:   Return in about 6 months (around 02/13/2020) for Medication Management, in person.    Recent Visits No visits were found meeting these conditions.  Showing recent visits within past 90 days and meeting all other requirements   Today's Visits Date Type Provider Dept  08/16/19 Office Visit Gillis Santa, MD [facility]  Showing today's visits and meeting all other requirements   Future Appointments No visits were found meeting these conditions.  Showing future appointments within next 90 days and meeting all other requirements   I discussed the assessment and treatment plan with the patient. The patient was provided an opportunity to ask questions and all were answered. The patient agreed with the plan and demonstrated an understanding of the instructions.  Patient advised to call back or seek an in-person  evaluation if the symptoms or condition worsens.  Duration of encounter: 15 minutes.  Note by: Gillis Santa, MD Date: 08/16/2019; Time: 9:12 AM

## 2019-09-28 ENCOUNTER — Other Ambulatory Visit: Payer: Self-pay | Admitting: Family Medicine

## 2019-09-28 NOTE — Telephone Encounter (Signed)
Courtesy refill. Unable to reach pt. By phone to schedule an appointment.

## 2019-10-03 ENCOUNTER — Ambulatory Visit (INDEPENDENT_AMBULATORY_CARE_PROVIDER_SITE_OTHER): Payer: PPO | Admitting: Family Medicine

## 2019-10-03 ENCOUNTER — Other Ambulatory Visit: Payer: Self-pay

## 2019-10-03 ENCOUNTER — Encounter: Payer: Self-pay | Admitting: Family Medicine

## 2019-10-03 VITALS — BP 109/47 | HR 54 | Temp 96.6°F | Wt 256.6 lb

## 2019-10-03 DIAGNOSIS — E039 Hypothyroidism, unspecified: Secondary | ICD-10-CM | POA: Diagnosis not present

## 2019-10-03 DIAGNOSIS — E785 Hyperlipidemia, unspecified: Secondary | ICD-10-CM

## 2019-10-03 DIAGNOSIS — I4891 Unspecified atrial fibrillation: Secondary | ICD-10-CM | POA: Diagnosis not present

## 2019-10-03 DIAGNOSIS — I10 Essential (primary) hypertension: Secondary | ICD-10-CM | POA: Diagnosis not present

## 2019-10-03 MED ORDER — LEVOTHYROXINE SODIUM 100 MCG PO TABS: 100.0000 ug | ORAL_TABLET | Freq: Every day | ORAL | 3 refills | Status: DC

## 2019-10-03 NOTE — Progress Notes (Signed)
Established patient visit      Patient: [name] THELEN Sr.   DOB: Jan 04, 1941   79 y.o. Male  MRN: [id number] Visit Date: 10/03/2019  Today's healthcare provider: [name], PA  Subjective:    Chief Complaint  Patient presents with  . Hypertension  . Hyperlipidemia  . Hypothyroidism   HPI  Hypertension, follow-up:  BP Readings from Last 3 Encounters:  10/03/19 (!) 109/47  08/19/18 (!) 160/133  06/01/18 (!) 121/55   He was last seen for hypertension 04/07/2018 BP at that visit was 106/58. Management changes since that visit include no change. He reports good compliance with treatment. He is not having side effects.  He is not exercising. He is adherent to low salt diet.   Outside blood pressures are being being checked at home. He is experiencing none.  Patient denies chest pain, chest pressure/discomfort, irregular heart beat, lower extremity edema and palpitations.   Cardiovascular risk factors include advanced age (older than 37 for men, 58 for women), dyslipidemia, hypertension and male gender.  Use of agents associated with hypertension: thyroid hormones.     Weight trend: stable Wt Readings from Last 3 Encounters:  10/03/19 256 lb 9.6 oz (116.4 kg)  08/19/18 257 lb (116.6 kg)  06/01/18 255 lb (115.7 kg)    Current diet: in general, a "healthy" diet    ------------------------------------------------------------------------  Hypothyroid, follow-up:  TSH  Date Value Ref Range Status  04/07/2018 2.830 0.450 - 4.500 uIU/mL Final  07/23/2017 6.14 (A) 0.41 - 5.90 Final    Comment:    [facility]  03/19/2017 6.78 (H) 0.40 - 4.50 mIU/L Final   Wt Readings from Last 3 Encounters:  10/03/19 256 lb 9.6 oz (116.4 kg)  08/19/18 257 lb (116.6 kg)  06/01/18 255 lb (115.7 kg)    He was last seen for hypothyroid 04/07/2018 Management since that visit includes no chnage. He reports good compliance with treatment. He is not having side effects.  He  is not exercising. He is experiencing heat / cold intolerance He denies change in energy level, diarrhea, nervousness, palpitations and weight changes Weight trend: stable  ------------------------------------------------------------------------  Lipid/Cholesterol, Follow-up:   Last seen for this 04/07/2018 Management changes since that visit include no changes. . Last Lipid Panel:    Component Value Date/Time   CHOL 150 04/07/2018 0853   TRIG 175 (H) 04/07/2018 0853   HDL 43 04/07/2018 0853   CHOLHDL 3.5 04/07/2018 0853   CHOLHDL 2.9 03/19/2017 0813   LDLCALC 72 04/07/2018 0853   LDLCALC 66 03/19/2017 0813    Risk factors for vascular disease include hypercholesterolemia and hypertension  He reports good compliance with treatment. He is not having side effects.  Current symptoms include none  Weight trend: stable Prior visit with dietician: no Current diet: in general, a "healthy" diet   Current exercise: none  Wt Readings from Last 3 Encounters:  10/03/19 256 lb 9.6 oz (116.4 kg)  08/19/18 257 lb (116.6 kg)  06/01/18 255 lb (115.7 kg)    -------------------------------------------------------------------   Patient Active Problem List   Diagnosis Date Noted  . Osteoarthritis of knee 09/17/2017  . Sebaceous cyst 03/30/2017  . Pre-diabetes 03/18/2017  . Lumbar herniated disc 06/02/2016  . Primary osteoarthritis of right hip 08/17/2015  . Allergic rhinitis 05/29/2015  . Arthritis 05/29/2015  . GERD (gastroesophageal reflux disease) 05/29/2015  . Hiatal hernia 05/29/2015  . LBP (low back pain) 05/29/2015  . Swelling of limb 05/29/2015  . Varicose veins of  lower extremities with inflammation 05/29/2015  . Atrial fibrillation ([place]) 12/19/2014  . Hyperlipidemia 12/19/2014  . Hypothyroidism 12/19/2014  . Hypertension 12/19/2014  . History of cardiac catheterization 02/17/2013  . Congestive cardiomyopathy ([place]) 02/17/2013  . Congestive heart failure, severe ([place])  10/04/2012   Past Medical History:  Diagnosis Date  . Allergy   . History of chicken pox   . Hyperlipidemia   . Hypertension    Past Surgical History:  Procedure Laterality Date  . BACK SURGERY     x5  . DOPPLER ECHOCARDIOGRAPHY  06/21/2013   EF=35% while in Sinus Rhythm  . Myocardial perfusion scan  10/2012   severe global LV enlargement. Mild RV enlargement. Mild LVH. Mild mitral and tricuspid insufficency  . polyp excision  09/2005  . UPPER GASTROINTESTINAL ENDOSCOPY  03/27/2004   Gastropathy, Gastritis; Duodenopathy   Social History   Tobacco Use  . Smoking status: Former Smoker    Packs/day: 2.00    Years: 50.00    Pack years: 100.00    Types: Cigarettes    Quit date: 07/01/2007    Years since quitting: 12.2  . Smokeless tobacco: Never Used  Substance Use Topics  . Alcohol use: No    Alcohol/week: 0.0 standard drinks  . Drug use: No   Family History  Problem Relation Age of Onset  . Alzheimer's disease Mother   . Heart attack Father   . Colon cancer Brother    No Known Allergies     Medications: Outpatient Medications Prior to Visit  Medication Sig  . amiodarone (PACERONE) 200 MG tablet TAKE 1 TABLET (200 MG TOTAL) BY MOUTH ONCE DAILY.  . furosemide (LASIX) 20 MG tablet TAKE 1 TABLET(20 MG) BY MOUTH DAILY AS NEEDED FOR FLUID RETENTION  . gabapentin (NEURONTIN) 300 MG capsule Take 1 capsule (300 mg total) by mouth 2 (two) times daily.  . hydrochlorothiazide (MICROZIDE) 12.5 MG capsule Take 1 capsule (12.5 mg total) by mouth daily.  [name] levothyroxine (SYNTHROID) 100 MCG tablet TAKE 1 TABLET(100 MCG) BY MOUTH DAILY  . lisinopril (ZESTRIL) 10 MG tablet Take 1 tablet (10 mg total) by mouth daily.  [name] lovastatin (MEVACOR) 20 MG tablet TAKE 1 TABLET BY MOUTH EVERY DAY  . metoprolol succinate (TOPROL-XL) 25 MG 24 hr tablet 1 TABLET BY MOUTH ONCE A DAY  . XARELTO 20 MG TABS tablet TAKE 1 TABLET BY MOUTH EVERY DAY   No facility-administered medications prior to visit.     Review of Systems  Constitutional: Negative.   Respiratory: Negative.   Cardiovascular: Negative.   Endocrine: Negative.   Musculoskeletal: Negative.         Objective:    BP (!) 109/47 (BP Location: Right Arm, Patient Position: Sitting, Cuff Size: Large)   Pulse (!) 54   Temp (!) 96.6 F (35.9 C) (Temporal)   Wt 256 lb 9.6 oz (116.4 kg)   BMI 32.95 kg/m    Physical Exam Constitutional:      General: He is not in acute distress.    Appearance: He is well-developed.  HENT:     Head: Normocephalic and atraumatic.     Right Ear: Hearing normal.     Left Ear: Hearing normal.     Nose: Nose normal.  Eyes:     General: Lids are normal. No scleral icterus.       Right eye: No discharge.        Left eye: No discharge.     Conjunctiva/sclera: Conjunctivae normal.  Cardiovascular:  Rate and Rhythm: Rhythm irregular.  Pulmonary:     Effort: Pulmonary effort is normal. No respiratory distress.  Abdominal:     General: Bowel sounds are normal.     Palpations: Abdomen is soft.  Musculoskeletal:     Right lower leg: No edema.     Left lower leg: No edema.  Skin:    Findings: No lesion or rash.  Neurological:     Mental Status: He is alert and oriented to person, place, and time.  Psychiatric:        Speech: Speech normal.        Behavior: Behavior normal.        Thought Content: Thought content normal.        Assessment & Plan:    1. Essential hypertension Dr. Saralyn Pilar has him on Metoprolol Succinate 25 mg qd, Lisinopril 10 mg qd and HCTZ 12.5 mg qd. Denies peripheral edema, chest pains, palpitations or dyspnea today. Labs on 08-12-19 showed normal liver panel and TSH. Plans follow up with him on 10-26-19 for recheck of labs on a once every 12 week schedule for the next year. Advised he should schedule for Medicare Wellness Screening and physical this year.  2. Hyperlipidemia, unspecified hyperlipidemia type Taking Lovastatin 20 mg qod and plans follow up with  cardiologist (Dr. Saralyn Pilar) on 10-26-19 for repeat labs. He prefers getting labs to check lipids at that time. Continue low fat diet and statin.  3. Hypothyroidism, unspecified type TSH on 08-12-19 per Dr. Saralyn Pilar was 3.160. Denies chest pains, palpitations, peripheral edema, tremor or hair loss. Still taking the Levothyroxine 100 mcg qd. Continue follow up labs as recommended by Dr. Saralyn Pilar. - levothyroxine (SYNTHROID) 100 MCG tablet; Take 1 tablet (100 mcg total) by mouth daily before breakfast.  Dispense: 90 tablet; Refill: 3  4. Atrial fibrillation, unspecified type ([employer]) A little irregular today. Scheduled for follow up with Dr.Paraschos on 10-26-19 and had liver panel with TSH done on 08-12-19. Still taking Metoprolol and Amiodarone daily.     [name], [place] [phone] (phone) [phone] (fax)  [place]

## 2019-10-26 DIAGNOSIS — I5022 Chronic systolic (congestive) heart failure: Secondary | ICD-10-CM | POA: Diagnosis not present

## 2019-10-26 DIAGNOSIS — I10 Essential (primary) hypertension: Secondary | ICD-10-CM | POA: Diagnosis not present

## 2019-10-26 DIAGNOSIS — Z9889 Other specified postprocedural states: Secondary | ICD-10-CM | POA: Diagnosis not present

## 2019-10-26 DIAGNOSIS — Z79899 Other long term (current) drug therapy: Secondary | ICD-10-CM | POA: Diagnosis not present

## 2019-10-26 DIAGNOSIS — E785 Hyperlipidemia, unspecified: Secondary | ICD-10-CM | POA: Diagnosis not present

## 2019-10-26 DIAGNOSIS — I480 Paroxysmal atrial fibrillation: Secondary | ICD-10-CM | POA: Diagnosis not present

## 2019-10-26 DIAGNOSIS — I420 Dilated cardiomyopathy: Secondary | ICD-10-CM | POA: Diagnosis not present

## 2019-10-26 DIAGNOSIS — Z5181 Encounter for therapeutic drug level monitoring: Secondary | ICD-10-CM | POA: Diagnosis not present

## 2019-10-28 ENCOUNTER — Other Ambulatory Visit: Payer: Self-pay | Admitting: Family Medicine

## 2019-10-28 DIAGNOSIS — E039 Hypothyroidism, unspecified: Secondary | ICD-10-CM

## 2019-10-28 NOTE — Telephone Encounter (Signed)
Requested Prescriptions  Pending Prescriptions Disp Refills  . levothyroxine (SYNTHROID) 100 MCG tablet [Pharmacy Med Name: LEVOTHYROXINE 0.100MG  (100MCG) TAB] 30 tablet     Sig: TAKE 1 TABLET(100 MCG) BY MOUTH DAILY     Endocrinology:  Hypothyroid Agents Failed - 10/28/2019  7:06 AM      Failed - TSH needs to be rechecked within 3 months after an abnormal result. Refill until TSH is due.      Failed - TSH in normal range and within 360 days    TSH  Date Value Ref Range Status  04/07/2018 2.830 0.450 - 4.500 uIU/mL Final         Passed - Valid encounter within last 12 months    Recent Outpatient Visits          3 weeks ago Essential hypertension   [place], Vickki Muff, [place]   1 year ago Essential hypertension   [facility] Birdie Sons, [place]   2 years ago [place], Donald E, MD   2 years ago Essential hypertension   [facility] Birdie Sons, MD   3 years ago Annual physical exam   [facility] Birdie Sons, MD

## 2019-12-20 DIAGNOSIS — I4891 Unspecified atrial fibrillation: Secondary | ICD-10-CM | POA: Diagnosis not present

## 2019-12-20 DIAGNOSIS — I517 Cardiomegaly: Secondary | ICD-10-CM | POA: Diagnosis not present

## 2020-02-09 ENCOUNTER — Telehealth: Payer: Self-pay | Admitting: Family Medicine

## 2020-02-09 NOTE — Telephone Encounter (Signed)
Copied from [place] [phone]. Topic: Medicare AWV >> Feb 09, 2020 12:30 PM Cher Nakai R wrote: Reason for CRM:  No answer unable to leave message for patient to call back and schedule Medicare Annual Wellness Visit (AWV) either virtually or in office.  Last AWV 02/17/2019  Please schedule at anytime with [facility] Health Advisor.  If any questions, please contact me at [phone]

## 2020-02-13 ENCOUNTER — Ambulatory Visit
Admission: RE | Admit: 2020-02-13 | Discharge: 2020-02-13 | Disposition: A | Discharge status: Home / Self Care | Payer: PPO | Source: Ambulatory Visit | Attending: Student in an Organized Health Care Education/Training Program | Admitting: Student in an Organized Health Care Education/Training Program

## 2020-02-13 ENCOUNTER — Other Ambulatory Visit: Payer: Self-pay

## 2020-02-13 ENCOUNTER — Ambulatory Visit (HOSPITAL_BASED_OUTPATIENT_CLINIC_OR_DEPARTMENT_OTHER): Payer: PPO | Admitting: Student in an Organized Health Care Education/Training Program

## 2020-02-13 ENCOUNTER — Encounter: Payer: Self-pay | Admitting: Student in an Organized Health Care Education/Training Program

## 2020-02-13 VITALS — BP 84/48 | HR 96 | Resp 18 | Ht 74.0 in | Wt 260.0 lb

## 2020-02-13 DIAGNOSIS — M47818 Spondylosis without myelopathy or radiculopathy, sacral and sacrococcygeal region: Secondary | ICD-10-CM | POA: Insufficient documentation

## 2020-02-13 DIAGNOSIS — M4186 Other forms of scoliosis, lumbar region: Secondary | ICD-10-CM | POA: Diagnosis not present

## 2020-02-13 DIAGNOSIS — M533 Sacrococcygeal disorders, not elsewhere classified: Secondary | ICD-10-CM

## 2020-02-13 DIAGNOSIS — G894 Chronic pain syndrome: Secondary | ICD-10-CM | POA: Diagnosis not present

## 2020-02-13 DIAGNOSIS — M5416 Radiculopathy, lumbar region: Secondary | ICD-10-CM | POA: Diagnosis not present

## 2020-02-13 DIAGNOSIS — G8929 Other chronic pain: Secondary | ICD-10-CM | POA: Insufficient documentation

## 2020-02-13 DIAGNOSIS — M47816 Spondylosis without myelopathy or radiculopathy, lumbar region: Secondary | ICD-10-CM | POA: Diagnosis not present

## 2020-02-13 MED ORDER — GABAPENTIN 300 MG PO CAPS: 300.0000 mg | ORAL_CAPSULE | Freq: Two times a day (BID) | ORAL | 1 refills | Status: DC

## 2020-02-13 NOTE — Patient Instructions (Addendum)
Preparing for your procedure (without sedation) Instructions: . Oral Intake: Do not eat or drink anything for at least 3 hours prior to your procedure. . Transportation: Unless otherwise stated by your physician, you may drive yourself after the procedure. . Blood Pressure Medicine: Take your blood pressure medicine with a sip of water the morning of the procedure. . Insulin: Take only  of your normal insulin dose. . Preventing infections: Shower with an antibacterial soap the morning of your procedure. . Build-up your immune system: Take 1000 mg of Vitamin C with every meal (3 times a day) the day prior to your procedure. . Pregnancy: If you are pregnant, call and cancel the procedure. . Sickness: If you have a cold, fever, or any active infections, call and cancel the procedure. . Arrival: You must be in the facility at least 30 minutes prior to your scheduled procedure. . Children: Do not bring any children with you. . Dress appropriately: Bring dark clothing that you would not mind if they get stained. . Valuables: Do not bring any jewelry or valuables. Procedure appointments are reserved for interventional treatments only. [name] No Prescription Refills. . No medication changes will be discussed during procedure appointments. . No disability issues will be discussed.   DO NOT STOP BLOOD THINNERSacroiliac (SI) Joint Injection Patient Information  Description: The sacroiliac joint connects the scrum (very low back and tailbone) to the ilium (a pelvic bone which also forms half of the hip joint).  Normally this joint experiences very little motion.  When this joint becomes inflamed or unstable low back and or hip and pelvis pain may result.  Injection of this joint with local anesthetics (numbing medicines) and steroids can provide diagnostic information and reduce pain.  This injection is performed with the aid of x-ray guidance into the tailbone area while you are lying on your stomach.   You  may experience an electrical sensation down the leg while this is being done.  You may also experience numbness.  We also may ask if we are reproducing your normal pain during the injection.  Conditions which may be treated SI injection:   Low back, buttock, hip or leg pain  Preparation for the Injection:  1. Do not eat any solid food or dairy products within 8 hours of your appointment.  2. You may drink clear liquids up to 3 hours before appointment.  Clear liquids include water, black coffee, juice or soda.  No milk or cream please. 3. You may take your regular medications, including pain medications with a sip of water before your appointment.  Diabetics should hold regular insulin (if take separately) and take 1/2 normal NPH dose the morning of the procedure.  Carry some sugar containing items with you to your appointment. 4. A driver must accompany you and be prepared to drive you home after your procedure. 5. Bring all of your current medications with you. 6. An IV may be inserted and sedation may be given at the discretion of the physician. 7. A blood pressure cuff, EKG and other monitors will often be applied during the procedure.  Some patients may need to have extra oxygen administered for a short period.  8. You will be asked to provide medical information, including your allergies, prior to the procedure.  We must know immediately if you are taking blood thinners (like Coumadin/Warfarin) or if you are allergic to IV iodine contrast (dye).  We must know if you could possible be pregnant.  Possible side effects:  Bleeding from needle site  Infection (rare, may require surgery)  Nerve injury (rare)  Numbness & tingling (temporary)  A brief convulsion or seizure  Light-headedness (temporary)  Pain at injection site (several days)  Decreased blood pressure (temporary)  Weakness in the leg (temporary)   Call if you experience:   New onset weakness or numbness of an  extremity below the injection site that last more than 8 hours.  Hives or difficulty breathing ( go to the emergency room)  Inflammation or drainage at the injection site  Any new symptoms which are concerning to you  Please note:  Although the local anesthetic injected can often make your back/ hip/ buttock/ leg feel good for several hours after the injections, the pain will likely return.  It takes 3-7 days for steroids to work in the sacroiliac area.  You may not notice any pain relief for at least that one week.  If effective, we will often do a series of three injections spaced 3-6 weeks apart to maximally decrease your pain.  After the initial series, we generally will wait some months before a repeat injection of the same type.  If you have any questions, please call [phone] [place]

## 2020-02-13 NOTE — Progress Notes (Signed)
Safety precautions to be maintained throughout the outpatient stay will include: orient to surroundings, keep bed in low position, maintain call bell within reach at all times, provide assistance with transfer out of bed and ambulation.  

## 2020-02-13 NOTE — Progress Notes (Signed)
PROVIDER NOTE: Information contained herein reflects review and annotations entered in association with encounter. Interpretation of such information and data should be left to medically-trained personnel. Information provided to patient can be located elsewhere in the medical record under "Patient Instructions". Document created using STT-dictation technology, any transcriptional errors that may result from process are unintentional.    Patient: [name] Friends Sr.  Service Category: E/M  Provider: Gillis Santa, MD  DOB: [birth year]  DOS: 02/13/2020  Specialty: Interventional Pain Management  MRN: [id number]  Setting: Ambulatory outpatient  PCP: Birdie Sons, MD  Type: Established Patient    Referring Provider: Birdie Sons, MD  Location: Office  Delivery: Face-to-face     HPI  Reason for encounter: Mr. [name] ROSSMAN Sr., a 79 y.o. year old male, is here today for evaluation and management of his Chronic right SI joint pain [M53.3, G89.29]. Mr. Henkels's primary complain today is Back Pain (low right) Last encounter: Practice (Visit date not found). My last encounter with him was on Visit date not found. Pertinent problems: [name] has Atrial fibrillation ([place]); History of cardiac catheterization; Congestive cardiomyopathy ([place]); LBP (low back pain); Lumbar herniated disc; Primary osteoarthritis of right hip; Chronic pain syndrome; Lumbar radiculopathy, chronic; and Chronic right SI joint pain on their pertinent problem list. Pain Assessment: Severity of Chronic pain is reported as a 8 /10. Location: Back Lower, Right/radiates into right hip and right groin. Onset: More than a month ago. Quality: Cramping. Timing: Intermittent. Modifying factor(s): gabapentin. Vitals:  height is '6\' 2"'$  (1.88 m) and weight is 260 lb (117.9 kg). His blood pressure is 84/48 (abnormal) and his pulse is 96. His respiration is 18 and oxygen saturation is 96%.    [name] follows up today for medication  management and worsening right hip and right groin pain.  He continues his gabapentin 300 mg twice a day which does provide analgesic benefit and improvement in functional status.  No side effects of sedation or lower extremity swelling.  He states that he is having more right-sided groin pain and right hip pain.  He has a history of lumbar spine fusion at L2-L3 and severe lumbar facet disease in his lower lumbar spine.  He has had multiple lumbar epidural steroid injections in the past with physical medicine and rehab at [facility] clinic which were not effective.  He is on Xarelto for atrial fibrillation.  He has never had any SI joint injection.   ROS  Constitutional: Denies any fever or chills Gastrointestinal: No reported hemesis, hematochezia, vomiting, or acute GI distress Musculoskeletal: Low back, right hip, right buttock, right SI joint pain Neurological: No reported episodes of acute onset apraxia, aphasia, dysarthria, agnosia, amnesia, paralysis, loss of coordination, or loss of consciousness  Medication Review  amiodarone, furosemide, gabapentin, hydrochlorothiazide, levothyroxine, lisinopril, lovastatin, metoprolol succinate, potassium chloride, and rivaroxaban  History Review  Allergy: [name] has No Known Allergies. Drug: [name]  reports no history of drug use. Alcohol:  reports no history of alcohol use. Tobacco:  reports that he quit smoking about 12 years ago. His smoking use included cigarettes. He has a 100.00 pack-year smoking history. He has never used smokeless tobacco. Social: [name]  reports that he quit smoking about 12 years ago. His smoking use included cigarettes. He has a 100.00 pack-year smoking history. He has never used smokeless tobacco. He reports that he does not drink alcohol and does not use drugs. Medical:  has a past medical history of Allergy,  History of chicken pox, Hyperlipidemia, and Hypertension. Surgical: [name]  has a past  surgical history that includes polyp excision (09/2005); Back surgery; doppler echocardiography (06/21/2013); Myocardial perfusion scan (10/2012); and Upper gastrointestinal endoscopy (03/27/2004). Family: family history includes Alzheimer's disease in his mother; Colon cancer in his brother; Heart attack in his father.  Laboratory Chemistry Profile   Renal Lab Results  Component Value Date   BUN 18 04/07/2018   CREATININE 1.80 (H) 04/07/2018   BCR 10 04/07/2018   GFRAA 42 (L) 04/07/2018   GFRNONAA 36 (L) 04/07/2018     Hepatic Lab Results  Component Value Date   AST 25 04/07/2018   ALT 24 04/07/2018   ALBUMIN 4.0 04/07/2018   ALKPHOS 67 04/07/2018     Electrolytes Lab Results  Component Value Date   NA 140 04/07/2018   K 3.2 (L) 04/07/2018   CL 100 04/07/2018   CALCIUM 9.3 04/07/2018   MG 1.9 05/31/2015   PHOS 2.9 06/02/2016     Bone No results found for: VD25OH, VD125OH2TOT, BO1751WC5, EN2778EU2, 25OHVITD1, 25OHVITD2, 25OHVITD3, TESTOFREE, TESTOSTERONE   Inflammation (CRP: Acute Phase) (ESR: Chronic Phase) No results found for: CRP, ESRSEDRATE, LATICACIDVEN     Note: Above Lab results reviewed.  Recent Imaging Review  MR Lumbar Spine Wo Contrast CLINICAL DATA:  Chronic Low back, right buttock, right hip and groin pain. Three prior back surgeries.  EXAM: MRI LUMBAR SPINE WITHOUT CONTRAST  TECHNIQUE: Multiplanar, multisequence MR imaging of the lumbar spine was performed. No intravenous contrast was administered.  COMPARISON:  None available  FINDINGS: Severe scoliosis and degenerative lumbar spondylosis with multilevel disc disease facet disease. The vertebral bodies demonstrate normal marrow signal. No bone lesions or acute fractures.  No significant paraspinal or retroperitoneal findings. Right renal cysts are noted. Tortuosity of the aorta.  T12-L1: Degenerate disc disease and facet disease but no focal disc protrusion, significant spinal or  foraminal stenosis.  L1-2: Central disc protrusion with mass effect on the ventral thecal sac slightly asymmetric right with right lateral recess stenosis possibly affecting the right L2 nerve root. There are also short pedicles and facet disease contributing to spinal stenosis. Mild right foraminal stenosis.  L2-3 fall interbody cage fusion changes. Moderate facet disease with mild bilateral lateral recess stenosis, right greater than left. No significant spinal stenosis.  L3-4: Bulging degenerated annulus with a focal central disc protrusion and mild mass effect on the ventral thecal sac. There is bilateral lateral recess stenosis and mild bilateral foraminal stenosis, right greater than left.  L4-5: Degenerative disc disease and facet disease but no disc protrusion. Moderate left foraminal stenosis and mild spinal stenosis.  L5-S1: Severe facet disease. No disc protrusions or spinal stenosis.  IMPRESSION: Severe scoliosis degenerative lumbar spondylosis with multilevel disc disease facet disease.  Central disc protrusion at L1-2 slightly asymmetric right with right lateral recess stenosis possibly affecting the right L2 nerve root. There is also mild spinal and right foraminal stenosis at this level.  Bulging degenerated annulus and focal disc protrusion at L3-4 with mild bilateral lateral recess stenosis and mild bilateral foraminal stenosis, right greater than left.  Moderate left foraminal stenosis and mild spinal stenosis at L4-5.  Electronically Signed   By: Marijo Sanes M.D.   On: 09/27/2015 15:04 Note: Reviewed        Physical Exam  General appearance: Well nourished, well developed, and well hydrated. In no apparent acute distress Mental status: Alert, oriented x 3 (person, place, & time)  Respiratory: No evidence of acute respiratory distress Eyes: PERLA Vitals: BP (!) 84/48   Pulse 96   Resp 18   Ht '6\' 2"'$  (1.88 m)   Wt 260 lb (117.9 kg)   SpO2  96%   BMI 33.38 kg/m  BMI: Estimated body mass index is 33.38 kg/m as calculated from the following:   Height as of this encounter: '6\' 2"'$  (1.88 m).   Weight as of this encounter: 260 lb (117.9 kg). Ideal: Ideal body weight: 82.2 kg (181 lb 3.5 oz) Adjusted ideal body weight: 96.5 kg (212 lb 11.7 oz)   Lumbar Spine Area Exam  Skin & Axial Inspection: Well healed scar from previous spine surgery detected Alignment: Symmetrical Functional ROM: Pain restricted ROM       Stability: No instability detected Muscle Tone/Strength: Functionally intact. No obvious neuro-muscular anomalies detected. Sensory (Neurological): Musculoskeletal pain pattern Palpation: No palpable anomalies       Provocative Tests:  Patrick's Maneuver: (+) for right-sided S-I arthralgia             FABER* test: (+) for right-sided S-I arthralgia             S-I anterior distraction/compression test: deferred today         S-I lateral compression test: deferred today         S-I Thigh-thrust test: deferred today         S-I Gaenslen's test: deferred today         *(Flexion, ABduction and External Rotation) Gait & Posture Assessment  Ambulation: Patient came in today in a wheel chair Gait: Limited. Using assistive device to ambulate Posture: Difficulty standing up straight, due to pain  Lower Extremity Exam    Side: Right lower extremity  Side: Left lower extremity  Stability: No instability observed          Stability: No instability observed          Skin & Extremity Inspection: Skin color, temperature, and hair growth are WNL. No peripheral edema or cyanosis. No masses, redness, swelling, asymmetry, or associated skin lesions. No contractures.  Skin & Extremity Inspection: Skin color, temperature, and hair growth are WNL. No peripheral edema or cyanosis. No masses, redness, swelling, asymmetry, or associated skin lesions. No contractures.  Functional ROM: Pain restricted ROM for hip joint          Functional ROM:  Unrestricted ROM                  Muscle Tone/Strength: Functionally intact. No obvious neuro-muscular anomalies detected.  Muscle Tone/Strength: Functionally intact. No obvious neuro-muscular anomalies detected.  Sensory (Neurological): Arthropathic arthralgia        Sensory (Neurological): Unimpaired        DTR: Patellar: deferred today Achilles: deferred today Plantar: deferred today  DTR: Patellar: deferred today Achilles: deferred today Plantar: deferred today  Palpation: No palpable anomalies  Palpation: No palpable anomalies    Assessment   Status Diagnosis  Persistent Persistent Persistent 1. Chronic right SI joint pain   2. Sacroiliac joint dysfunction of right side   3. SI joint arthritis   4. Lumbar radiculopathy, chronic   5. Chronic pain syndrome      Updated Problems: Problem  Chronic Pain Syndrome  Lumbar Radiculopathy, Chronic  Chronic Right Si Joint Pain  Lumbar Herniated Disc   Severe scoliosis degenerative lumbar spondylosis with multilevel disc disease facet disease.  Central disc protrusion at L1-2 slightly asymmetric right with right lateral recess stenosis  possibly affecting the right L2 nerve root. There is also mild spinal and right foraminal stenosis at this level.  Bulging degenerated annulus and focal disc protrusion at L3-4 with mild bilateral lateral recess stenosis and mild bilateral foraminal stenosis, right greater than left.  Moderate left foraminal stenosis and mild spinal stenosis at L4-5.   Primary Osteoarthritis of Right Hip  Lbp (Low Back Pain)   s/p disc surgery x 3 on lumbar and x 2 on cervical spine in the 1990s   Atrial Fibrillation (Hcc)  History of Cardiac Catheterization   Overview:  11/17/12 [facility]:  Mid LM tubular 30% stenosis, Mid LAD tubular 30% stenosis, Mid LCX, discrete 30% stenosis, OM1 tubular 50% stenosis and 30% stenosis, Prox RCA tubular 30% stenosis.   Congestive Cardiomyopathy (Hcc)   Overview:  EF  22% by nuclear stress test 11/05/12.   EF=40% echo 05/30/2015     Plan of Care  Mr. SHELLEY POOLEY Sr. has a current medication list which includes the following long-term medication(s): amiodarone, furosemide, gabapentin, hydrochlorothiazide, levothyroxine, lisinopril, lovastatin, metoprolol succinate, potassium chloride, and xarelto.  1.  Refill gabapentin as below.  No change in dose 2.  Right SI joint x-ray 3.  Diagnostic right sacroiliac joint injection, continue Xarelto as prescribed.  Pharmacotherapy (Medications Ordered): Meds ordered this encounter  Medications  . gabapentin (NEURONTIN) 300 MG capsule    Sig: Take 1 capsule (300 mg total) by mouth 2 (two) times daily.    Dispense:  180 capsule    Refill:  1   Orders:  Orders Placed This Encounter  Procedures  . SACROILIAC JOINT INJECTION    Standing Status:   Future    Standing Expiration Date:   03/15/2020    Scheduling Instructions:     Side: RIGHT     Sedation:without     Timeframe: ASAP    Order Specific Question:   Where will this procedure be performed?    Answer:   [facility]  . DG Si Joints    Standing Status:   Future    Standing Expiration Date:   05/15/2020    Order Specific Question:   Reason for Exam (SYMPTOM  OR DIAGNOSIS REQUIRED)    Answer:   Sacroiliac joint pain    Order Specific Question:   Preferred imaging location?    Answer:   [facility]    Order Specific Question:   Call Results- Best Contact Number?    Answer:   [phone] [facility])   Follow-up plan:   Return in about 1 week (around 02/20/2020) for Right SI J injection (do not stop blood thinner).   Recent Visits No visits were found meeting these conditions. Showing recent visits within past 90 days and meeting all other requirements Today's Visits Date Type Provider Dept  02/13/20 Office Visit Gillis Santa, MD [facility]  Showing today's visits and meeting all other requirements Future  Appointments Date Type Provider Dept  02/20/20 Appointment Gillis Santa, MD [facility]  Showing future appointments within next 90 days and meeting all other requirements  I discussed the assessment and treatment plan with the patient. The patient was provided an opportunity to ask questions and all were answered. The patient agreed with the plan and demonstrated an understanding of the instructions.  Patient advised to call back or seek an in-person evaluation if the symptoms or condition worsens.  Duration of encounter: 27mnutes.  Note by: BGillis Santa MD Date: 02/13/2020; Time: 2:13 PM

## 2020-02-20 ENCOUNTER — Ambulatory Visit
Admission: RE | Admit: 2020-02-20 | Discharge: 2020-02-20 | Disposition: A | Discharge status: Home / Self Care | Payer: PPO | Source: Ambulatory Visit | Attending: Student in an Organized Health Care Education/Training Program | Admitting: Student in an Organized Health Care Education/Training Program

## 2020-02-20 ENCOUNTER — Ambulatory Visit (HOSPITAL_BASED_OUTPATIENT_CLINIC_OR_DEPARTMENT_OTHER): Payer: PPO | Admitting: Student in an Organized Health Care Education/Training Program

## 2020-02-20 ENCOUNTER — Encounter: Payer: Self-pay | Admitting: Student in an Organized Health Care Education/Training Program

## 2020-02-20 ENCOUNTER — Other Ambulatory Visit: Payer: Self-pay

## 2020-02-20 VITALS — BP 151/73 | HR 68 | Temp 97.9°F | Resp 20 | Ht 74.0 in | Wt 260.0 lb

## 2020-02-20 DIAGNOSIS — G8929 Other chronic pain: Secondary | ICD-10-CM | POA: Diagnosis not present

## 2020-02-20 DIAGNOSIS — M47818 Spondylosis without myelopathy or radiculopathy, sacral and sacrococcygeal region: Secondary | ICD-10-CM

## 2020-02-20 DIAGNOSIS — Z7952 Long term (current) use of systemic steroids: Secondary | ICD-10-CM | POA: Diagnosis not present

## 2020-02-20 DIAGNOSIS — M533 Sacrococcygeal disorders, not elsewhere classified: Secondary | ICD-10-CM | POA: Diagnosis not present

## 2020-02-20 MED ORDER — LIDOCAINE HCL 2 % IJ SOLN
20.0000 mL | Freq: Once | INTRAMUSCULAR | Status: AC
  Administered 2020-02-20: 400 mg

## 2020-02-20 MED ORDER — ROPIVACAINE HCL 2 MG/ML IJ SOLN
9.0000 mL | Freq: Once | INTRAMUSCULAR | Status: AC
  Administered 2020-02-20: 9 mL via PERINEURAL
  Filled 2020-02-20: qty 10

## 2020-02-20 MED ORDER — METHYLPREDNISOLONE ACETATE 40 MG/ML IJ SUSP
40.0000 mg | Freq: Once | INTRAMUSCULAR | Status: AC
  Administered 2020-02-20: 40 mg via INTRA_ARTICULAR
  Filled 2020-02-20: qty 1

## 2020-02-20 MED ORDER — LIDOCAINE HCL 2 % IJ SOLN
INTRAMUSCULAR | Status: AC
  Filled 2020-02-20: qty 20

## 2020-02-20 MED ORDER — IOHEXOL 180 MG/ML  SOLN
10.0000 mL | Freq: Once | INTRAMUSCULAR | Status: AC
  Administered 2020-02-20: 10 mL via INTRA_ARTICULAR

## 2020-02-20 NOTE — Progress Notes (Signed)
Safety precautions to be maintained throughout the outpatient stay will include: orient to surroundings, keep bed in low position, maintain call bell within reach at all times, provide assistance with transfer out of bed and ambulation.  

## 2020-02-20 NOTE — Progress Notes (Signed)
PROVIDER NOTE: Information contained herein reflects review and annotations entered in association with encounter. Interpretation of such information and data should be left to medically-trained personnel. Information provided to patient can be located elsewhere in the medical record under "Patient Instructions". Document created using STT-dictation technology, any transcriptional errors that may result from process are unintentional.    Patient: [name] Friends Sr.  Service Category: Procedure  Provider: Gillis Santa, MD  DOB: 04/03/1941  DOS: 02/20/2020  Location: [facility]  MRN: [id number]  Setting: Ambulatory - outpatient  Referring Provider: Birdie Sons, MD  Type: Established Patient  Specialty: Interventional Pain Management  PCP: Birdie Sons, MD   Primary Reason for Visit: Interventional Pain Management Treatment. CC: Back Pain (lower)  Procedure:          Anesthesia, Analgesia, Anxiolysis:  Type: Diagnostic Sacroiliac Joint Steroid Injection #1  Region: Inferior Lumbosacral Region Level: PIIS (Posterior Inferior Iliac Spine) Laterality: Right-Side  Type: Local Anesthesia  Local Anesthetic: Lidocaine 1-2%  Position: Prone           Indications: 1. Chronic right SI joint pain   2. Sacroiliac joint dysfunction of right side   3. SI joint arthritis    Pain Score: Pre-procedure: 8 /10 Post-procedure: 0-No pain (when moving, patient numb)/10   Pre-op Assessment:  Mr. Ciszewski is a 79 y.o. (year old), male patient, seen today for interventional treatment. He  has a past surgical history that includes polyp excision (09/2005); Back surgery; doppler echocardiography (06/21/2013); Myocardial perfusion scan (10/2012); and Upper gastrointestinal endoscopy (03/27/2004). Mr. Halbert has a current medication list which includes the following prescription(s): amiodarone, furosemide, gabapentin, hydrochlorothiazide, levothyroxine, lisinopril, lovastatin, metoprolol  succinate, potassium chloride, and xarelto. His primarily concern today is the Back Pain (lower)  Initial Vital Signs:  Pulse/HCG Rate: (!) 57  Temp: 97.9 F (36.6 C) Resp: 16 BP: (!) 110/50 SpO2: 100 %  BMI: Estimated body mass index is 33.38 kg/m as calculated from the following:   Height as of this encounter: 6\' 2"  (1.88 m).   Weight as of this encounter: 260 lb (117.9 kg).  Risk Assessment: Allergies: Reviewed. He has No Known Allergies.  Allergy Precautions: None required Coagulopathies: Reviewed. None identified.  Blood-thinner therapy: None at this time Active Infection(s): Reviewed. None identified. Mr. Gallaga is afebrile  Site Confirmation: Mr. Schliep was asked to confirm the procedure and laterality before marking the site Procedure checklist: Completed Consent: Before the procedure and under the influence of no sedative(s), amnesic(s), or anxiolytics, the patient was informed of the treatment options, risks and possible complications. To fulfill our ethical and legal obligations, as recommended by the American Medical Association's Code of Ethics, I have informed the patient of my clinical impression; the nature and purpose of the treatment or procedure; the risks, benefits, and possible complications of the intervention; the alternatives, including doing nothing; the risk(s) and benefit(s) of the alternative treatment(s) or procedure(s); and the risk(s) and benefit(s) of doing nothing. The patient was provided information about the general risks and possible complications associated with the procedure. These may include, but are not limited to: failure to achieve desired goals, infection, bleeding, organ or nerve damage, allergic reactions, paralysis, and death. In addition, the patient was informed of those risks and complications associated to the procedure, such as failure to decrease pain; infection; bleeding; organ or nerve damage with subsequent damage to sensory,  motor, and/or autonomic systems, resulting in permanent pain, numbness, and/or weakness of one or several areas  of the body; allergic reactions; (i.e.: anaphylactic reaction); and/or death. Furthermore, the patient was informed of those risks and complications associated with the medications. These include, but are not limited to: allergic reactions (i.e.: anaphylactic or anaphylactoid reaction(s)); adrenal axis suppression; blood sugar elevation that in diabetics may result in ketoacidosis or comma; water retention that in patients with history of congestive heart failure may result in shortness of breath, pulmonary edema, and decompensation with resultant heart failure; weight gain; swelling or edema; medication-induced neural toxicity; particulate matter embolism and blood vessel occlusion with resultant organ, and/or nervous system infarction; and/or aseptic necrosis of one or more joints. Finally, the patient was informed that Medicine is not an exact science; therefore, there is also the possibility of unforeseen or unpredictable risks and/or possible complications that may result in a catastrophic outcome. The patient indicated having understood very clearly. We have given the patient no guarantees and we have made no promises. Enough time was given to the patient to ask questions, all of which were answered to the patient's satisfaction. Mr. Dutton has indicated that he wanted to continue with the procedure. Attestation: I, the ordering provider, attest that I have discussed with the patient the benefits, risks, side-effects, alternatives, likelihood of achieving goals, and potential problems during recovery for the procedure that I have provided informed consent. Date  Time: 02/20/2020 10:54 AM  Pre-Procedure Preparation:  Monitoring: As per clinic protocol. Respiration, ETCO2, SpO2, BP, heart rate and rhythm monitor placed and checked for adequate function Safety Precautions: Patient was assessed  for positional comfort and pressure points before starting the procedure. Time-out: I initiated and conducted the "Time-out" before starting the procedure, as per protocol. The patient was asked to participate by confirming the accuracy of the "Time Out" information. Verification of the correct person, site, and procedure were performed and confirmed by me, the nursing staff, and the patient. "Time-out" conducted as per Joint Commission's Universal Protocol (UP.01.01.01). Time: 1159  Description of Procedure:          Target Area: Inferior, posterior, aspect of the sacroiliac fissure Approach: Posterior, paraspinal, ipsilateral approach. Area Prepped: Entire Lower Lumbosacral Region DuraPrep (Iodine Povacrylex [0.7% available iodine] and Isopropyl Alcohol, 74% w/w) Safety Precautions: Aspiration looking for blood return was conducted prior to all injections. At no point did we inject any substances, as a needle was being advanced. No attempts were made at seeking any paresthesias. Safe injection practices and needle disposal techniques used. Medications properly checked for expiration dates. SDV (single dose vial) medications used. Description of the Procedure: Protocol guidelines were followed. The patient was placed in position over the procedure table. The target area was identified and the area prepped in the usual manner. Skin & deeper tissues infiltrated with local anesthetic. Appropriate amount of time allowed to pass for local anesthetics to take effect. The procedure needle was advanced under fluoroscopic guidance into the sacroiliac joint until a firm endpoint was obtained. Proper needle placement secured. Negative aspiration confirmed. Solution injected in intermittent fashion, asking for systemic symptoms every 0.5cc of injectate. The needles were then removed and the area cleansed, making sure to leave some of the prepping solution back to take advantage of its long term bactericidal  properties. Vitals:   02/20/20 1102 02/20/20 1158 02/20/20 1208  BP: (!) 110/50 (!) 146/70 (!) 151/73  Pulse: (!) 57 67 68  Resp: 16 16 20   Temp: 97.9 F (36.6 C)    TempSrc: Temporal    SpO2: 100% 100% 100%  Weight:  260 lb (117.9 kg)    Height: 6\' 2"  (1.88 m)      Start Time: 1159 hrs. End Time: 1206 hrs. Materials:  Needle(s) Type: Spinal Needle Gauge: 22G Length: 3.5-in Medication(s): Please see orders for medications and dosing details. 5 cc solution made of 4 cc of 0.2% ropivacaine, 1 cc of methylprednisolone, 40 mg/cc.  3 cc injected intra-articular, 2 cc injected periarticular. Imaging Guidance (Non-Spinal):          Type of Imaging Technique: Fluoroscopy Guidance (Non-Spinal) Indication(s): Assistance in needle guidance and placement for procedures requiring needle placement in or near specific anatomical locations not easily accessible without such assistance. Exposure Time: Please see nurses notes. Contrast: Before injecting any contrast, we confirmed that the patient did not have an allergy to iodine, shellfish, or radiological contrast. Once satisfactory needle placement was completed at the desired level, radiological contrast was injected. Contrast injected under live fluoroscopy. No contrast complications. See chart for type and volume of contrast used. Fluoroscopic Guidance: I was personally present during the use of fluoroscopy. "Tunnel Vision Technique" used to obtain the best possible view of the target area. Parallax error corrected before commencing the procedure. "Direction-depth-direction" technique used to introduce the needle under continuous pulsed fluoroscopy. Once target was reached, antero-posterior, oblique, and lateral fluoroscopic projection used confirm needle placement in all planes. Images permanently stored in EMR. Interpretation: I personally interpreted the imaging intraoperatively. Adequate needle placement confirmed in multiple planes. Appropriate  spread of contrast into desired area was observed. No evidence of afferent or efferent intravascular uptake. Permanent images saved into the patient's record.  Antibiotic Prophylaxis:   Anti-infectives (From admission, onward)   None     Indication(s): None identified  Post-operative Assessment:  Post-procedure Vital Signs:  Pulse/HCG Rate: 68  Temp: 97.9 F (36.6 C) Resp: 20 BP: (!) 151/73 SpO2: 100 %  EBL: None  Complications: No immediate post-treatment complications observed by team, or reported by patient.  Note: The patient tolerated the entire procedure well. A repeat set of vitals were taken after the procedure and the patient was kept under observation following institutional policy, for this type of procedure. Post-procedural neurological assessment was performed, showing return to baseline, prior to discharge. The patient was provided with post-procedure discharge instructions, including a section on how to identify potential problems. Should any problems arise concerning this procedure, the patient was given instructions to immediately contact us, at any time, without hesitation. In any case, we plan to contact the patient by telephone for a follow-up status report regarding this interventional procedure.  Comments:  No additional relevant information.  Plan of Care  Orders:  Orders Placed This Encounter  Procedures  . DG PA[facility] C-ARM 1-60 MIN NO REPORT    Intraoperative interpretation by procedural physician at [place].    Standing Status:   Standing    Number of Occurrences:   1    Order Specific Question:   Reason for exam:    Answer:   Assistance in needle guidance and placement for procedures requiring needle placement in or near specific anatomical locations not easily accessible without such assistance.   Medications ordered for procedure: Meds ordered this encounter  Medications  . lidocaine (XYLOCAINE) 2 % (with pres) injection 400 mg   . iohexol (OMNIPAQUE) 180 MG/ML injection 10 mL    Must be Myelogram-compatible. If not available, you may substitute with a water-soluble, non-ionic, hypoallergenic, myelogram-compatible radiological contrast medium.  . ropivacaine (PF) 2 mg/mL (0.2%) (NAROPIN) injection  9 mL  . methylPREDNISolone acetate (DEPO-MEDROL) injection 40 mg   Medications administered: We administered lidocaine, iohexol, ropivacaine (PF) 2 mg/mL (0.2%), and methylPREDNISolone acetate.  See the medical record for exact dosing, route, and time of administration.  Follow-up plan:   Return in about 4 weeks (around 03/19/2020) for Post Procedure Evaluation, virtual.      Status post right SI joint injection on 02/20/2020   Recent Visits Date Type Provider Dept  02/13/20 Office Visit Gillis Santa, MD [facility]  Showing recent visits within past 90 days and meeting all other requirements Today's Visits Date Type Provider Dept  02/20/20 Procedure visit Gillis Santa, MD [facility]  Showing today's visits and meeting all other requirements Future Appointments Date Type Provider Dept  03/19/20 Appointment Gillis Santa, MD [facility]  Showing future appointments within next 90 days and meeting all other requirements  Disposition: Discharge home  Discharge (Date  Time): 02/20/2020;   hrs.   Primary Care Physician: Birdie Sons, MD Location: [facility] Note by: Gillis Santa, MD Date: 02/20/2020; Time: 2:08 PM  Disclaimer:  Medicine is not an exact science. The only guarantee in medicine is that nothing is guaranteed. It is important to note that the decision to proceed with this intervention was based on the information collected from the patient. The Data and conclusions were drawn from the patient's questionnaire, the interview, and the physical examination. Because the information was provided in large part by the patient, it cannot be guaranteed  that it has not been purposely or unconsciously manipulated. Every effort has been made to obtain as much relevant data as possible for this evaluation. It is important to note that the conclusions that lead to this procedure are derived in large part from the available data. Always take into account that the treatment will also be dependent on availability of resources and existing treatment guidelines, considered by other Pain Management Practitioners as being common knowledge and practice, at the time of the intervention. For Medico-Legal purposes, it is also important to point out that variation in procedural techniques and pharmacological choices are the acceptable norm. The indications, contraindications, technique, and results of the above procedure should only be interpreted and judged by a Board-Certified Interventional Pain Specialist with extensive familiarity and expertise in the same exact procedure and technique.

## 2020-02-21 ENCOUNTER — Telehealth: Payer: Self-pay

## 2020-02-21 NOTE — Telephone Encounter (Signed)
Post procedure phone call.  Patient states he is doing ok today.   

## 2020-02-22 NOTE — Progress Notes (Addendum)
Subjective:   [name] GURRY Sr. is a 79 y.o. male who presents for Medicare Annual/Subsequent preventive examination.  I connected with [name] today by telephone and verified that I am speaking with the correct person using two identifiers. Location patient: home Location provider: work Persons participating in the virtual visit: patient, provider.   I discussed the limitations, risks, security and privacy concerns of performing an evaluation and management service by telephone and the availability of in person appointments. I also discussed with the patient that there may be a patient responsible charge related to this service. The patient expressed understanding and verbally consented to this telephonic visit.    Interactive audio and video telecommunications were attempted between this provider and patient, however failed, due to patient having technical difficulties OR patient did not have access to video capability.  We continued and completed visit with audio only.   Review of Systems    N/A  Cardiac Risk Factors include: advanced age (>16men, >16 women);dyslipidemia;male gender;hypertension     Objective:    Today's Vitals   02/23/20 0814  PainSc: 0-No pain   There is no height or weight on file to calculate BMI.  Advanced Directives 02/23/2020 02/20/2020 02/13/2020 02/17/2019 08/19/2018 06/01/2018 05/04/2018  Does Patient Have a Medical Advance Directive? Yes Yes Yes Yes Yes Yes Yes  Type of Advance Directive (No Data) Living will [place];Living will [place];Living will [place];Living will [place];Living will [name];Living will  Copy of [place] in Chart? - - - No - copy requested No - copy requested - Yes  Would patient like information on creating a medical advance directive? - - - - - - -    Current Medications (verified) Outpatient  Encounter Medications as of 02/23/2020  Medication Sig   amiodarone (PACERONE) 200 MG tablet 200 mg.    furosemide (LASIX) 20 MG tablet TAKE 1 TABLET(20 MG) BY MOUTH DAILY AS NEEDED FOR FLUID RETENTION   gabapentin (NEURONTIN) 300 MG capsule Take 1 capsule (300 mg total) by mouth 2 (two) times daily.   hydrochlorothiazide (MICROZIDE) 12.5 MG capsule Take 1 capsule (12.5 mg total) by mouth daily.   levothyroxine (SYNTHROID) 100 MCG tablet TAKE 1 TABLET(100 MCG) BY MOUTH DAILY   lisinopril (ZESTRIL) 10 MG tablet Take 1 tablet (10 mg total) by mouth daily.   lovastatin (MEVACOR) 20 MG tablet TAKE 1 TABLET BY MOUTH EVERY DAY   metoprolol succinate (TOPROL-XL) 25 MG 24 hr tablet 1 TABLET BY MOUTH ONCE A DAY   potassium chloride (KLOR-CON) 10 MEQ tablet Take 10 mEq by mouth daily.   XARELTO 20 MG TABS tablet TAKE 1 TABLET BY MOUTH EVERY DAY   No facility-administered encounter medications on file as of 02/23/2020.    Allergies (verified) Patient has no known allergies.   History: Past Medical History:  Diagnosis Date   Allergy    History of chicken pox    Hyperlipidemia    Hypertension    Past Surgical History:  Procedure Laterality Date   BACK SURGERY     x5   DOPPLER ECHOCARDIOGRAPHY  06/21/2013   EF=35% while in Sinus Rhythm   Myocardial perfusion scan  10/2012   severe global LV enlargement. Mild RV enlargement. Mild LVH. Mild mitral and tricuspid insufficency   polyp excision  09/2005   UPPER GASTROINTESTINAL ENDOSCOPY  03/27/2004   Gastropathy, Gastritis; Duodenopathy   Family History  Problem Relation  Age of Onset   Alzheimer's disease Mother    Heart attack Father    Colon cancer Brother    Social History   Socioeconomic History   Marital status: Widowed    Spouse name: Not on file   Number of children: 2   Years of education: Not on file   Highest education level: 12th grade  Occupational History   Occupation: Retired/ Disabled  Tobacco Use   Smoking status:  Former Smoker    Packs/day: 2.00    Years: 50.00    Pack years: 100.00    Types: Cigarettes    Quit date: 07/01/2007    Years since quitting: 12.6   Smokeless tobacco: Never Used  Scientific laboratory technician Use: Never used  Substance and Sexual Activity   Alcohol use: No    Alcohol/week: 0.0 standard drinks   Drug use: No   Sexual activity: Not on file  Other Topics Concern   Not on file  Social History Narrative   Not on file   Social Determinants of Health   Financial Resource Strain: Low Risk    Difficulty of Paying Living Expenses: Not hard at all  Food Insecurity: No Food Insecurity   Worried About Charity fundraiser in the Last Year: Never true   [place] in the Last Year: Never true  Transportation Needs: No Transportation Needs   Lack of Transportation (Medical): No   Lack of Transportation (Non-Medical): No  Physical Activity: Inactive   Days of Exercise per Week: 0 days   Minutes of Exercise per Session: 0 min  Stress: No Stress Concern Present   Feeling of Stress : Not at all  Social Connections: Socially Isolated   Frequency of Communication with Friends and Family: More than three times a week   Frequency of Social Gatherings with Friends and Family: More than three times a week   Attends Religious Services: Never   Marine scientist or Organizations: No   Attends Archivist Meetings: Never   Marital Status: Widowed    Tobacco Counseling Counseling given: Not Answered   Clinical Intake:  Pre-visit preparation completed: Yes  Pain : No/denies pain Pain Score: 0-No pain     Nutritional Risks: Nausea/ vomitting/ diarrhea Diabetes: No  How often do you need to have someone help you when you read instructions, pamphlets, or other written materials from your doctor or pharmacy?: 1 - Never  Diabetic? No  Interpreter Needed?: No  Information entered by :: [facility], LPN   Activities of Daily Living In your present state of  health, do you have any difficulty performing the following activities: 02/23/2020  Hearing? N  Vision? N  Difficulty concentrating or making decisions? N  Walking or climbing stairs? N  Dressing or bathing? N  Doing errands, shopping? N  Preparing Food and eating ? N  Using the Toilet? N  In the past six months, have you accidently leaked urine? N  Do you have problems with loss of bowel control? N  Managing your Medications? N  Managing your Finances? N  Housekeeping or managing your Housekeeping? N  Some recent data might be hidden    Patient Care Team: Chrismon, Vickki Muff, PA as PCP - General (Family Medicine) Isaias Cowman, MD as Consulting Physician (Cardiology) Gillis Santa, MD as Consulting Physician (Pain Medicine)  Indicate any recent Medical Services you may have received from other than [facility] providers in the past year (date may be approximate).  Assessment:   This is a routine wellness examination for [name].  Hearing/Vision screen No exam data present  Dietary issues and exercise activities discussed: Current Exercise Habits: The patient does not participate in regular exercise at present, Exercise limited by: orthopedic condition(s)  Goals      DIET - REDUCE SUGAR INTAKE     Recommend to decrease or completely cut out all extra sugars in diet.        Depression Screen PHQ 2/9 Scores 02/20/2020 02/13/2020 02/17/2019 08/19/2018 06/01/2018 05/04/2018 07/07/2017  PHQ - 2 Score 0 0 0 0 0 0 0  PHQ- 9 Score - - - - - - 0    Fall Risk Fall Risk  02/23/2020 02/20/2020 02/13/2020 02/17/2019 08/19/2018  Falls in the past year? 0 0 0 0 0  Number falls in past yr: 0 0 - - 0  Injury with Fall? 0 0 - - 0  Follow up - Falls evaluation completed - - -    Any stairs in or around the home? No  If so, are there any without handrails? No  Home free of loose throw rugs in walkways, pet beds, electrical cords, etc? Yes  Adequate lighting in your home to reduce risk of  falls? Yes   ASSISTIVE DEVICES UTILIZED TO PREVENT FALLS:  Life alert? No  Use of a cane, walker or w/c? Yes  Grab bars in the bathroom? No  Shower chair or bench in shower? No  Elevated toilet seat or a handicapped toilet? Yes    Cognitive Function: Declined today.         Immunizations Immunization History  Administered Date(s) Administered   Influenza Split 03/22/2012   Influenza, High Dose Seasonal PF 05/23/2014, 06/04/2015, 03/18/2017, 03/17/2018   Pneumococcal Conjugate-13 05/23/2014   Pneumococcal Polysaccharide-23 06/04/2015   Tdap 04/21/2014    TDAP status: Up to date Flu Vaccine status: Due fall 2021 Pneumococcal vaccine status: Up to date Covid-19 vaccine status: Completed vaccines  Qualifies for Shingles Vaccine? Yes   Zostavax completed No   Shingrix Completed?: No.    Education has been provided regarding the importance of this vaccine. Patient has been advised to call insurance company to determine out of pocket expense if they have not yet received this vaccine. Advised may also receive vaccine at local pharmacy or Health Dept. Verbalized acceptance and understanding.  Screening Tests Health Maintenance  Topic Date Due   COVID-19 Vaccine (1) Never done   INFLUENZA VACCINE  01/29/2020   TETANUS/TDAP  04/21/2024   PNA vac Low Risk Adult  Completed    Health Maintenance  Health Maintenance Due  Topic Date Due   COVID-19 Vaccine (1) Never done   INFLUENZA VACCINE  01/29/2020    Colorectal cancer screening: No longer required.   Lung Cancer Screening: (Low Dose CT Chest recommended if Age 45-80 years, 30 pack-year currently smoking OR have quit w/in 15years.) does not qualify.    Additional Screening:  Vision Screening: Recommended annual ophthalmology exams for early detection of glaucoma and other disorders of the eye. Is the patient up to date with their annual eye exam? No, pt plans to schedule apt this year. Who is the provider or what is  the name of the office in which the patient attends annual eye exams? N/A If pt is not established with a provider, would they like to be referred to a provider to establish care? No .   Dental Screening: Recommended annual dental exams for proper oral hygiene  Community  Resource Referral / Chronic Care Management: CRR required this visit?  No   CCM required this visit?  No      Plan:     I have personally reviewed and noted the following in the patient's chart:   Medical and social history Use of alcohol, tobacco or illicit drugs  Current medications and supplements Functional ability and status Nutritional status Physical activity Advanced directives List of other physicians Hospitalizations, surgeries, and ER visits in previous 12 months Vitals Screenings to include cognitive, depression, and falls Referrals and appointments  In addition, I have reviewed and discussed with patient certain preventive protocols, quality metrics, and best practice recommendations. A written personalized care plan for preventive services as well as general preventive health recommendations were provided to patient.     Gloria Lambertson [place], [place]   2/75/1700   Nurse Notes: Due for a flu shot this fall. Advised pt to bring in a copy of his Covid vaccine card to up date chart.   Reviewed Nurse Health Advisor's note and plan. Agree with documentation and recommendations.

## 2020-02-23 ENCOUNTER — Other Ambulatory Visit: Payer: Self-pay

## 2020-02-23 ENCOUNTER — Ambulatory Visit (INDEPENDENT_AMBULATORY_CARE_PROVIDER_SITE_OTHER): Payer: PPO

## 2020-02-23 DIAGNOSIS — Z0000 Encounter for general adult medical examination without abnormal findings: Secondary | ICD-10-CM | POA: Diagnosis not present

## 2020-02-23 NOTE — Patient Instructions (Signed)
[name] , Thank you for taking time to come for your Medicare Wellness Visit. I appreciate your ongoing commitment to your health goals. Please review the following plan we discussed and let me know if I can assist you in the future.   Screening recommendations/referrals: Colonoscopy: No longer required.  Recommended yearly ophthalmology/optometry visit for glaucoma screening and checkup Recommended yearly dental visit for hygiene and checkup  Vaccinations: Influenza vaccine: Due fall 2021 Pneumococcal vaccine: Completed series Tdap vaccine: Up to date, due 03/2024 Shingles vaccine: Shingrix discussed. Please contact your pharmacy for coverage information.     Advanced directives: Currently on file.  Conditions/risks identified: Continue to decrease or completely cut out all extra sugars in diet.   Next appointment: 04/03/20 @ 10:40 AM with [name]. Declined scheduling an AWV for 2022 at this time.   Preventive Care 77 Years and Older, Male Preventive care refers to lifestyle choices and visits with your health care provider that can promote health and wellness. What does preventive care include?  A yearly physical exam. This is also called an annual well check.  Dental exams once or twice a year.  Routine eye exams. Ask your health care provider how often you should have your eyes checked.  Personal lifestyle choices, including:  Daily care of your teeth and gums.  Regular physical activity.  Eating a healthy diet.  Avoiding tobacco and drug use.  Limiting alcohol use.  Practicing safe sex.  Taking low doses of aspirin every day.  Taking vitamin and mineral supplements as recommended by your health care provider. What happens during an annual well check? The services and screenings done by your health care provider during your annual well check will depend on your age, overall health, lifestyle risk factors, and family history of disease. Counseling    Your health care provider may ask you questions about your:  Alcohol use.  Tobacco use.  Drug use.  Emotional well-being.  Home and relationship well-being.  Sexual activity.  Eating habits.  History of falls.  Memory and ability to understand (cognition).  Work and work Statistician. Screening  You may have the following tests or measurements:  Height, weight, and BMI.  Blood pressure.  Lipid and cholesterol levels. These may be checked every 5 years, or more frequently if you are over 67 years old.  Skin check.  Lung cancer screening. You may have this screening every year starting at age 57 if you have a 30-pack-year history of smoking and currently smoke or have quit within the past 15 years.  Fecal occult blood test (FOBT) of the stool. You may have this test every year starting at age 63.  Flexible sigmoidoscopy or colonoscopy. You may have a sigmoidoscopy every 5 years or a colonoscopy every 10 years starting at age 68.  Prostate cancer screening. Recommendations will vary depending on your family history and other risks.  Hepatitis C blood test.  Hepatitis B blood test.  Sexually transmitted disease (STD) testing.  Diabetes screening. This is done by checking your blood sugar (glucose) after you have not eaten for a while (fasting). You may have this done every 1-3 years.  Abdominal aortic aneurysm (AAA) screening. You may need this if you are a current or former smoker.  Osteoporosis. You may be screened starting at age 73 if you are at high risk. Talk with your health care provider about your test results, treatment options, and if necessary, the need for more tests. Vaccines  Your health care provider  may recommend certain vaccines, such as:  Influenza vaccine. This is recommended every year.  Tetanus, diphtheria, and acellular pertussis (Tdap, Td) vaccine. You may need a Td booster every 10 years.  Zoster vaccine. You may need this after age  77.  Pneumococcal 13-valent conjugate (PCV13) vaccine. One dose is recommended after age 14.  Pneumococcal polysaccharide (PPSV23) vaccine. One dose is recommended after age 77. Talk to your health care provider about which screenings and vaccines you need and how often you need them. This information is not intended to replace advice given to you by your health care provider. Make sure you discuss any questions you have with your health care provider. Document Released: 07/13/2015 Document Revised: 03/05/2016 Document Reviewed: 04/17/2015 Elsevier Interactive Patient Education  2017 [place] Prevention in the Home Falls can cause injuries. They can happen to people of all ages. There are many things you can do to make your home safe and to help prevent falls. What can I do on the outside of my home?  Regularly fix the edges of walkways and driveways and fix any cracks.  Remove anything that might make you trip as you walk through a door, such as a raised step or threshold.  Trim any bushes or trees on the path to your home.  Use bright outdoor lighting.  Clear any walking paths of anything that might make someone trip, such as rocks or tools.  Regularly check to see if handrails are loose or broken. Make sure that both sides of any steps have handrails.  Any raised decks and porches should have guardrails on the edges.  Have any leaves, snow, or ice cleared regularly.  Use sand or salt on walking paths during winter.  Clean up any spills in your garage right away. This includes oil or grease spills. What can I do in the bathroom?  Use night lights.  Install grab bars by the toilet and in the tub and shower. Do not use towel bars as grab bars.  Use non-skid mats or decals in the tub or shower.  If you need to sit down in the shower, use a plastic, non-slip stool.  Keep the floor dry. Clean up any water that spills on the floor as soon as it happens.  Remove  soap buildup in the tub or shower regularly.  Attach bath mats securely with double-sided non-slip rug tape.  Do not have throw rugs and other things on the floor that can make you trip. What can I do in the bedroom?  Use night lights.  Make sure that you have a light by your bed that is easy to reach.  Do not use any sheets or blankets that are too big for your bed. They should not hang down onto the floor.  Have a firm chair that has side arms. You can use this for support while you get dressed.  Do not have throw rugs and other things on the floor that can make you trip. What can I do in the kitchen?  Clean up any spills right away.  Avoid walking on wet floors.  Keep items that you use a lot in easy-to-reach places.  If you need to reach something above you, use a strong step stool that has a grab bar.  Keep electrical cords out of the way.  Do not use floor polish or wax that makes floors slippery. If you must use wax, use non-skid floor wax.  Do not have throw rugs  and other things on the floor that can make you trip. What can I do with my stairs?  Do not leave any items on the stairs.  Make sure that there are handrails on both sides of the stairs and use them. Fix handrails that are broken or loose. Make sure that handrails are as long as the stairways.  Check any carpeting to make sure that it is firmly attached to the stairs. Fix any carpet that is loose or worn.  Avoid having throw rugs at the top or bottom of the stairs. If you do have throw rugs, attach them to the floor with carpet tape.  Make sure that you have a light switch at the top of the stairs and the bottom of the stairs. If you do not have them, ask someone to add them for you. What else can I do to help prevent falls?  Wear shoes that:  Do not have high heels.  Have rubber bottoms.  Are comfortable and fit you well.  Are closed at the toe. Do not wear sandals.  If you use a  stepladder:  Make sure that it is fully opened. Do not climb a closed stepladder.  Make sure that both sides of the stepladder are locked into place.  Ask someone to hold it for you, if possible.  Clearly mark and make sure that you can see:  Any grab bars or handrails.  First and last steps.  Where the edge of each step is.  Use tools that help you move around (mobility aids) if they are needed. These include:  Canes.  Walkers.  Scooters.  Crutches.  Turn on the lights when you go into a dark area. Replace any light bulbs as soon as they burn out.  Set up your furniture so you have a clear path. Avoid moving your furniture around.  If any of your floors are uneven, fix them.  If there are any pets around you, be aware of where they are.  Review your medicines with your doctor. Some medicines can make you feel dizzy. This can increase your chance of falling. Ask your doctor what other things that you can do to help prevent falls. This information is not intended to replace advice given to you by your health care provider. Make sure you discuss any questions you have with your health care provider. Document Released: 04/12/2009 Document Revised: 11/22/2015 Document Reviewed: 07/21/2014 Elsevier Interactive Patient Education  2017 [employer].

## 2020-03-19 ENCOUNTER — Telehealth: Payer: PPO | Admitting: Student in an Organized Health Care Education/Training Program

## 2020-03-21 ENCOUNTER — Other Ambulatory Visit: Payer: Self-pay

## 2020-03-21 ENCOUNTER — Encounter: Payer: Self-pay | Admitting: Student in an Organized Health Care Education/Training Program

## 2020-03-21 ENCOUNTER — Ambulatory Visit
Payer: PPO | Attending: Student in an Organized Health Care Education/Training Program | Admitting: Student in an Organized Health Care Education/Training Program

## 2020-03-21 DIAGNOSIS — M47818 Spondylosis without myelopathy or radiculopathy, sacral and sacrococcygeal region: Secondary | ICD-10-CM

## 2020-03-21 DIAGNOSIS — G894 Chronic pain syndrome: Secondary | ICD-10-CM

## 2020-03-21 DIAGNOSIS — G8929 Other chronic pain: Secondary | ICD-10-CM | POA: Diagnosis not present

## 2020-03-21 DIAGNOSIS — M533 Sacrococcygeal disorders, not elsewhere classified: Secondary | ICD-10-CM

## 2020-03-21 NOTE — Progress Notes (Signed)
Patient: [name] PHEGLEY Sr.  Service Category: E/M  Provider: Gillis Santa, MD  DOB: 15-Nov-1940  DOS: 03/21/2020  Location: Office  MRN: [id number]  Setting: Ambulatory outpatient  Referring Provider: Birdie Sons, MD  Type: Established Patient  Specialty: Interventional Pain Management  PCP: Margo Common, PA  Location: Home  Delivery: TeleHealth     Virtual Encounter - Pain Management PROVIDER NOTE: Information contained herein reflects review and annotations entered in association with encounter. Interpretation of such information and data should be left to medically-trained personnel. Information provided to patient can be located elsewhere in the medical record under "Patient Instructions". Document created using STT-dictation technology, any transcriptional errors that may result from process are unintentional.    Contact & Pharmacy Preferred: [phone] Home: [phone] (home) Mobile: [phone] (mobile) E-mail: No e-mail address on record  [facility] ([place]) [place], [place] [address] Phone: [phone] Fax: [phone]  [id number] [address], [place] [address] [place] [address] Phone: [phone] Fax: [phone]  [id number], [place] - [address] [place] [address] Phone: [phone] Fax: [phone]   Pre-screening  [name] offered "in-person" vs "virtual" encounter. He indicated preferring virtual for this encounter.   Reason COVID-19*  Social distancing based on CDC and AMA recommendations.   I contacted TYQWAN PINK Sr. on 03/21/2020 via video conference.      I clearly identified myself as Gillis Santa, MD. I verified that I was speaking with the correct person using two identifiers  (Name: [name] EIFLER Sr., and date of birth: 1941-03-16).  Consent I sought verbal advanced consent from [place] for virtual visit interactions. I informed [name] of possible security and privacy concerns, risks, and limitations associated with providing "not-in-person" medical evaluation and management services. I also informed [name] of the availability of "in-person" appointments. Finally, I informed him that there would be a charge for the virtual visit and that he could be  personally, fully or partially, financially responsible for it. [name] expressed understanding and agreed to proceed.   Historic Elements   Mr. [name] AIKEN Sr. is a 79 y.o. year old, male patient evaluated today after our last contact on 02/20/2020. [name]  has a past medical history of Allergy, History of chicken pox, Hyperlipidemia, and Hypertension. He also  has a past surgical history that includes polyp excision (09/2005); Back surgery; doppler echocardiography (06/21/2013); Myocardial perfusion scan (10/2012); and Upper gastrointestinal endoscopy (03/27/2004). [name] has a current medication list which includes the following prescription(s): amiodarone, furosemide, gabapentin, hydrochlorothiazide, levothyroxine, lisinopril, lovastatin, metoprolol succinate, potassium chloride, and xarelto. He  reports that he quit smoking about 12 years ago. His smoking use included cigarettes. He has a 100.00 pack-year smoking history. He has never used smokeless tobacco. He reports that he does not drink alcohol and does not use drugs. [name] has No Known Allergies.   HPI  Today, he is being contacted for a post-procedure assessment.   Post-Procedure Evaluation  Procedure (02/20/2020):   ype: Diagnostic Sacroiliac Joint Steroid Injection #1  Region: Inferior Lumbosacral Region Level: PIIS (Posterior Inferior Iliac Spine) Laterality: Right-Side  Sedation: Please see nurses note.   Effectiveness during initial hour after procedure(Ultra-Short Term Relief): 100 %  Local anesthetic used: Long-acting (4-6 hours) Effectiveness: Defined as any analgesic benefit obtained secondary to the administration of local anesthetics. This carries significant diagnostic value as to the etiological location, or anatomical origin, of the pain. Duration of benefit is expected to coincide with the duration of the local anesthetic used.  Effectiveness during initial 4-6 hours after procedure(Short-Term Relief): 100 %   Long-term benefit: Defined as any relief past the pharmacologic duration of the local anesthetics.  Effectiveness past the initial 6 hours after procedure(Long-Term Relief): 70 %  Current benefits: Defined as benefit that persist at this time.   Analgesia:  >50% relief Function: [name] reports improvement in function ROM: [name] reports improvement in ROM    Laboratory Chemistry Profile   Renal Lab Results  Component Value Date   BUN 18 04/07/2018   CREATININE 1.80 (H) 04/07/2018   BCR 10 04/07/2018   GFRAA 42 (L) 04/07/2018   GFRNONAA 36 (L) 04/07/2018     Hepatic Lab Results  Component Value Date   AST 25 04/07/2018   ALT 24 04/07/2018   ALBUMIN 4.0 04/07/2018   ALKPHOS 67 04/07/2018     Electrolytes Lab Results  Component Value Date   NA 140 04/07/2018   K 3.2 (L) 04/07/2018   CL 100 04/07/2018   CALCIUM 9.3 04/07/2018   MG 1.9 05/31/2015   PHOS 2.9 06/02/2016     Bone No results found for: VD25OH, VD125OH2TOT, JE5631SH7, WY6378HY8, 25OHVITD1, 25OHVITD2, 25OHVITD3, TESTOFREE, TESTOSTERONE   Inflammation (CRP: Acute Phase) (ESR: Chronic Phase) No results found for: CRP, ESRSEDRATE, LATICACIDVEN     Note: Above Lab results reviewed.   Assessment  The primary encounter diagnosis was Chronic right SI joint pain. Diagnoses of Sacroiliac joint dysfunction of right side, SI joint arthritis, and Chronic pain syndrome were also pertinent  to this visit.  Plan of Care  [name] has a current medication list which includes the following long-term medication(s): amiodarone, furosemide, gabapentin, hydrochlorothiazide, levothyroxine, lisinopril, lovastatin, metoprolol succinate, potassium chloride, and xarelto.  Good relief after previous right sacroiliac joint injection performed on 02/20/2020.  Endorses decrease in pain and improvement in functional status.  Can repeat as needed.  Orders:  Orders Placed This Encounter  Procedures  . SACROILIAC JOINT INJECTION    Scheduling timeframe: (PRN procedure) Mr. Terlizzi will call when needed. Clinical indication: Low back pain, w/ or w/o groin pain. Sacroiliac joint pain Sedation: Usually done with sedation. (May be done without sedation if so desired by patient.) Requirements: NPO x 8 hrs.; Driver; Stop blood thinners.    Standing Status:   Standing    Number of Occurrences:   1    Standing Expiration Date:   03/21/2021    Scheduling Instructions:     Side: RIGHT     Sedation: Patient's choice.    Order Specific Question:   Where will this procedure be performed?    Answer:   [facility]   Follow-up plan:   Return if symptoms worsen or fail to improve.     Status post right SI joint injection on 02/20/2020: Helped significantly repeat as needed    Recent Visits Date Type Provider Dept  02/20/20 Procedure visit Gillis Santa, MD [facility]  02/13/20 Office Visit Gillis Santa, MD [facility]  Showing recent visits within past 90 days and meeting all other requirements Today's Visits Date Type Provider Dept  03/21/20 Telemedicine Gillis Santa, MD [facility]  Showing today's visits  and meeting all other requirements Future Appointments No visits were found meeting these conditions. Showing future appointments within next 90 days and meeting all other requirements  I discussed the assessment and treatment plan with the  patient. The patient was provided an opportunity to ask questions and all were answered. The patient agreed with the plan and demonstrated an understanding of the instructions.  Patient advised to call back or seek an in-person evaluation if the symptoms or condition worsens.  Duration of encounter:20 minutes.  Note by: Gillis Santa, MD Date: 03/21/2020; Time: 3:30 PM

## 2020-04-03 ENCOUNTER — Ambulatory Visit (INDEPENDENT_AMBULATORY_CARE_PROVIDER_SITE_OTHER): Payer: PPO | Admitting: Family Medicine

## 2020-04-03 ENCOUNTER — Other Ambulatory Visit: Payer: Self-pay

## 2020-04-03 ENCOUNTER — Encounter: Payer: Self-pay | Admitting: Family Medicine

## 2020-04-03 VITALS — BP 107/63 | HR 54 | Temp 97.9°F | Wt 251.0 lb

## 2020-04-03 DIAGNOSIS — E039 Hypothyroidism, unspecified: Secondary | ICD-10-CM | POA: Diagnosis not present

## 2020-04-03 DIAGNOSIS — I4891 Unspecified atrial fibrillation: Secondary | ICD-10-CM | POA: Diagnosis not present

## 2020-04-03 DIAGNOSIS — Z23 Encounter for immunization: Secondary | ICD-10-CM

## 2020-04-03 DIAGNOSIS — R7303 Prediabetes: Secondary | ICD-10-CM | POA: Diagnosis not present

## 2020-04-03 DIAGNOSIS — I5040 Unspecified combined systolic (congestive) and diastolic (congestive) heart failure: Secondary | ICD-10-CM

## 2020-04-03 DIAGNOSIS — E785 Hyperlipidemia, unspecified: Secondary | ICD-10-CM | POA: Diagnosis not present

## 2020-04-03 DIAGNOSIS — I10 Essential (primary) hypertension: Secondary | ICD-10-CM

## 2020-04-03 NOTE — Progress Notes (Signed)
Complete physical exam   Patient: [name] ECHAVARRIA Sr.   DOB: [birth year]   79 y.o. Male  MRN: [id number] Visit Date: 04/03/2020  Today's healthcare provider: [name], PA   Chief Complaint  Patient presents with   Annual Exam   Subjective    [name] SHAHAN Sr. is a 79 y.o. male who presents today for a complete physical exam.  He reports consuming a general diet. The patient does not participate in regular exercise at present. He generally feels well. He reports sleeping well. He does not have additional problems to discuss today.      Past Medical History:  Diagnosis Date   Allergy    History of chicken pox    Hyperlipidemia    Hypertension    Past Surgical History:  Procedure Laterality Date   BACK SURGERY     x5   DOPPLER ECHOCARDIOGRAPHY  06/21/2013   EF=35% while in Sinus Rhythm   Myocardial perfusion scan  10/2012   severe global LV enlargement. Mild RV enlargement. Mild LVH. Mild mitral and tricuspid insufficency   polyp excision  09/2005   UPPER GASTROINTESTINAL ENDOSCOPY  03/27/2004   Gastropathy, Gastritis; Duodenopathy   Social History   Socioeconomic History   Marital status: Widowed    Spouse name: Not on file   Number of children: 2   Years of education: Not on file   Highest education level: 12th grade  Occupational History   Occupation: Retired/ Disabled  Tobacco Use   Smoking status: Former Smoker    Packs/day: 2.00    Years: 50.00    Pack years: 100.00    Types: Cigarettes    Quit date: 07/01/2007    Years since quitting: 12.7   Smokeless tobacco: Never Used  Scientific laboratory technician Use: Never used  Substance and Sexual Activity   Alcohol use: No    Alcohol/week: 0.0 standard drinks   Drug use: No   Sexual activity: Not on file  Other Topics Concern   Not on file  Social History Narrative   Not on file   Social Determinants of Health   Financial Resource Strain: Low Risk    Difficulty of Paying Living Expenses: Not  hard at all  Food Insecurity: No Food Insecurity   Worried About Charity fundraiser in the Last Year: Never true   [place] in the Last Year: Never true  Transportation Needs: No Transportation Needs   Lack of Transportation (Medical): No   Lack of Transportation (Non-Medical): No  Physical Activity: Inactive   Days of Exercise per Week: 0 days   Minutes of Exercise per Session: 0 min  Stress: No Stress Concern Present   Feeling of Stress : Not at all  Social Connections: Socially Isolated   Frequency of Communication with Friends and Family: More than three times a week   Frequency of Social Gatherings with Friends and Family: More than three times a week   Attends Religious Services: Never   Marine scientist or Organizations: No   Attends Archivist Meetings: Never   Marital Status: Widowed  Human resources officer Violence: Not At Risk   Fear of Current or Ex-Partner: No   Emotionally Abused: No   Physically Abused: No   Sexually Abused: No   Family Status  Relation Name Status   Mother  Deceased   Father  Deceased   Sister  Deceased   Brother  Deceased  Sister  Alive   Sister  Alive   Brother  Alive   Family History  Problem Relation Age of Onset   Alzheimer's disease Mother    Heart attack Father    Colon cancer Brother    No Known Allergies  Patient Care Team: Bertrand, Vickki Muff, [place] as PCP - General (Family Medicine) Isaias Cowman, MD as Consulting Physician (Cardiology) Gillis Santa, MD as Consulting Physician (Pain Medicine)   Medications: Outpatient Medications Prior to Visit  Medication Sig   amiodarone (PACERONE) 200 MG tablet 200 mg.    furosemide (LASIX) 20 MG tablet TAKE 1 TABLET(20 MG) BY MOUTH DAILY AS NEEDED FOR FLUID RETENTION   gabapentin (NEURONTIN) 300 MG capsule Take 1 capsule (300 mg total) by mouth 2 (two) times daily.   hydrochlorothiazide (MICROZIDE) 12.5 MG capsule Take 1 capsule (12.5 mg total) by mouth daily.    levothyroxine (SYNTHROID) 100 MCG tablet TAKE 1 TABLET(100 MCG) BY MOUTH DAILY   lisinopril (ZESTRIL) 10 MG tablet Take 1 tablet (10 mg total) by mouth daily.   lovastatin (MEVACOR) 20 MG tablet TAKE 1 TABLET BY MOUTH EVERY DAY   metoprolol succinate (TOPROL-XL) 25 MG 24 hr tablet 1 TABLET BY MOUTH ONCE A DAY   potassium chloride (KLOR-CON) 10 MEQ tablet Take 10 mEq by mouth daily.   XARELTO 20 MG TABS tablet TAKE 1 TABLET BY MOUTH EVERY DAY   No facility-administered medications prior to visit.    Review of Systems  Constitutional: Negative.   HENT: Negative.   Eyes: Negative.   Respiratory: Negative.   Cardiovascular: Negative.   Gastrointestinal: Negative.   Endocrine: Positive for heat intolerance.  Genitourinary: Negative.   Musculoskeletal: Positive for arthralgias and back pain.  Skin: Negative.   Allergic/Immunologic: Positive for environmental allergies.  Neurological: Negative.   Hematological: Negative.   Psychiatric/Behavioral: Negative.       Objective    BP 107/63 (BP Location: Left Arm, Patient Position: Sitting, Cuff Size: Normal)   Pulse (!) 54   Temp 97.9 F (36.6 C) (Oral)   Wt 251 lb (113.9 kg)   SpO2 98%   BMI 32.23 kg/m    Physical Exam Constitutional:      Appearance: Normal appearance. He is normal weight.  HENT:     Head: Normocephalic and atraumatic.     Right Ear: Tympanic membrane, ear canal and external ear normal.     Left Ear: Tympanic membrane, ear canal and external ear normal.     Nose: Nose normal.     Mouth/Throat:     Mouth: Mucous membranes are moist.     Pharynx: Oropharynx is clear.  Eyes:     Extraocular Movements: Extraocular movements intact.     Conjunctiva/sclera: Conjunctivae normal.     Pupils: Pupils are equal, round, and reactive to light.  Cardiovascular:     Rate and Rhythm: Normal rate and regular rhythm.     Pulses: Normal pulses.     Heart sounds: Normal heart sounds.  Pulmonary:     Effort: Pulmonary  effort is normal.     Breath sounds: Normal breath sounds.  Abdominal:     General: Abdomen is flat. Bowel sounds are normal.     Palpations: Abdomen is soft.  Musculoskeletal:        General: Normal range of motion.     Cervical back: Normal range of motion and neck supple.  Skin:    General: Skin is warm and dry.  Neurological:  General: No focal deficit present.     Mental Status: He is alert and oriented to person, place, and time. Mental status is at baseline.  Psychiatric:        Mood and Affect: Mood normal.        Behavior: Behavior normal.        Thought Content: Thought content normal.        Judgment: Judgment normal.     Last depression screening scores PHQ 2/9 Scores 02/20/2020 02/13/2020 02/17/2019  PHQ - 2 Score 0 0 0  PHQ- 9 Score - - -   Last fall risk screening Fall Risk  02/23/2020  Falls in the past year? 0  Number falls in past yr: 0  Injury with Fall? 0  Follow up -   Last Audit-C alcohol use screening Alcohol Use Disorder Test (AUDIT) 02/23/2020  1. How often do you have a drink containing alcohol? 0  2. How many drinks containing alcohol do you have on a typical day when you are drinking? 0  3. How often do you have six or more drinks on one occasion? 0  AUDIT-C Score 0  Alcohol Brief Interventions/Follow-up AUDIT Score <7 follow-up not indicated   A score of 3 or more in women, and 4 or more in men indicates increased risk for alcohol abuse, EXCEPT if all of the points are from question 1   No results found for any visits on 04/03/20.  Assessment & Plan    Routine Health Maintenance and Physical Exam  Exercise Activities and Dietary recommendations Goals      DIET - REDUCE SUGAR INTAKE     Recommend to decrease or completely cut out all extra sugars in diet.         Immunization History  Administered Date(s) Administered   Influenza Split 03/22/2012   Influenza, High Dose Seasonal PF 05/23/2014, 06/04/2015, 03/18/2017, 03/17/2018    Pneumococcal Conjugate-13 05/23/2014   Pneumococcal Polysaccharide-23 06/04/2015   Tdap 04/21/2014    Health Maintenance  Topic Date Due   COVID-19 Vaccine (1) Never done   INFLUENZA VACCINE  01/29/2020   TETANUS/TDAP  04/21/2024   PNA vac Low Risk Adult  Completed    Discussed health benefits of physical activity, and encouraged him to engage in regular exercise appropriate for his age and condition.  1. Primary hypertension Well controlled BP on HCTZ 12.5 mg qd, Lisinopril 10 mg qd, Lasix 20 mg prn edema, Metoprolol Succinate 25 mg qd - CBC with Differential/Platelet - Comprehensive metabolic panel - TSH  2. Atrial fibrillation, unspecified type ([place]) Asymptomatic without palpitations recently. Has been on Metoprolol Succinates 25 mg qd with Amiodarone 200 gm qd and Xarelto 20 mg qd in the past. Followed by Dr. Saralyn Pilar (cardiologist). Recheck labs. - CBC with Differential/Platelet - Comprehensive metabolic panel - TSH  3. Hypothyroidism, unspecified type Tolerating Levothyroxine 100 mcg qd. Recheck labs. - CBC with Differential/Platelet - Comprehensive metabolic panel - TSH  4. Hyperlipidemia, unspecified hyperlipidemia type Has been on Lovastatin 20 mg qd in the past. Recheck lipid panel. - Lipid Panel With LDL/HDL Ratio  5. Pre-diabetes Hgb A1C up to 5.8 in 2017. No diabetes symptoms. Recheck labs. - Comprehensive metabolic panel - Hemoglobin A1c  6. Need for influenza vaccination - Flu Vaccine QUAD High Dose(Fluad)  7. CHF (congestive heart failure), NYHA class II, unspecified failure chronicity, combined ([place]) Followed by Dr. Saralyn Pilar (cardiologist) with EF 40%.   No follow-ups on file.     I, [name],  PA-C, have reviewed all documentation for this visit. The documentation on 02/18/21 for the exam, diagnosis, procedures, and orders are all accurate and complete.    [name], [place] [phone]  (phone) [phone] (fax)  [place]

## 2020-04-04 DIAGNOSIS — I10 Essential (primary) hypertension: Secondary | ICD-10-CM | POA: Diagnosis not present

## 2020-04-04 DIAGNOSIS — R7303 Prediabetes: Secondary | ICD-10-CM | POA: Diagnosis not present

## 2020-04-04 DIAGNOSIS — E039 Hypothyroidism, unspecified: Secondary | ICD-10-CM | POA: Diagnosis not present

## 2020-04-04 DIAGNOSIS — I4891 Unspecified atrial fibrillation: Secondary | ICD-10-CM | POA: Diagnosis not present

## 2020-04-04 DIAGNOSIS — E785 Hyperlipidemia, unspecified: Secondary | ICD-10-CM | POA: Diagnosis not present

## 2020-04-05 LAB — COMPREHENSIVE METABOLIC PANEL
ALT: 28 IU/L (ref 0–44)
AST: 25 IU/L (ref 0–40)
Albumin/Globulin Ratio: 1.9 (ref 1.2–2.2)
Albumin: 4.1 g/dL (ref 3.7–4.7)
Alkaline Phosphatase: 84 IU/L (ref 44–121)
BUN/Creatinine Ratio: 13 (ref 10–24)
BUN: 25 mg/dL (ref 8–27)
Bilirubin Total: 1 mg/dL (ref 0.0–1.2)
CO2: 22 mmol/L (ref 20–29)
Calcium: 9.4 mg/dL (ref 8.6–10.2)
Chloride: 102 mmol/L (ref 96–106)
Creatinine, Ser: 1.86 mg/dL — ABNORMAL HIGH (ref 0.76–1.27)
GFR calc Af Amer: 39 mL/min/{1.73_m2} — ABNORMAL LOW (ref >59)
GFR calc non Af Amer: 34 mL/min/{1.73_m2} — ABNORMAL LOW (ref >59)
Globulin, Total: 2.2 g/dL (ref 1.5–4.5)
Glucose: 94 mg/dL (ref 65–99)
Potassium: 4.5 mmol/L (ref 3.5–5.2)
Sodium: 139 mmol/L (ref 134–144)
Total Protein: 6.3 g/dL (ref 6.0–8.5)

## 2020-04-05 LAB — CBC WITH DIFFERENTIAL/PLATELET
Basophils Absolute: 0.1 10*3/uL (ref 0.0–0.2)
Basos: 1 %
EOS (ABSOLUTE): 0.2 10*3/uL (ref 0.0–0.4)
Eos: 4 %
Hematocrit: 39.6 % (ref 37.5–51.0)
Hemoglobin: 13.4 g/dL (ref 13.0–17.7)
Immature Grans (Abs): 0.1 10*3/uL (ref 0.0–0.1)
Immature Granulocytes: 2 %
Lymphocytes Absolute: 0.7 10*3/uL (ref 0.7–3.1)
Lymphs: 12 %
MCH: 31.3 pg (ref 26.6–33.0)
MCHC: 33.8 g/dL (ref 31.5–35.7)
MCV: 93 fL (ref 79–97)
Monocytes Absolute: 0.7 10*3/uL (ref 0.1–0.9)
Monocytes: 11 %
Neutrophils Absolute: 4.6 10*3/uL (ref 1.4–7.0)
Neutrophils: 70 %
Platelets: 196 10*3/uL (ref 150–450)
RBC: 4.28 x10E6/uL (ref 4.14–5.80)
RDW: 13.8 % (ref 11.6–15.4)
WBC: 6.4 10*3/uL (ref 3.4–10.8)

## 2020-04-05 LAB — LIPID PANEL WITH LDL/HDL RATIO
Cholesterol, Total: 152 mg/dL (ref 100–199)
HDL: 44 mg/dL (ref >39)
LDL Chol Calc (NIH): 89 mg/dL (ref 0–99)
LDL/HDL Ratio: 2 ratio (ref 0.0–3.6)
Triglycerides: 106 mg/dL (ref 0–149)
VLDL Cholesterol Cal: 19 mg/dL (ref 5–40)

## 2020-04-05 LAB — HEMOGLOBIN A1C
Est. average glucose Bld gHb Est-mCnc: 111 mg/dL
Hgb A1c MFr Bld: 5.5 % (ref 4.8–5.6)

## 2020-04-05 LAB — TSH: TSH: 1.24 u[IU]/mL (ref 0.450–4.500)

## 2020-05-03 DIAGNOSIS — I10 Essential (primary) hypertension: Secondary | ICD-10-CM | POA: Diagnosis not present

## 2020-05-03 DIAGNOSIS — I480 Paroxysmal atrial fibrillation: Secondary | ICD-10-CM | POA: Diagnosis not present

## 2020-05-03 DIAGNOSIS — I5022 Chronic systolic (congestive) heart failure: Secondary | ICD-10-CM | POA: Diagnosis not present

## 2020-05-03 DIAGNOSIS — E785 Hyperlipidemia, unspecified: Secondary | ICD-10-CM | POA: Diagnosis not present

## 2020-06-11 ENCOUNTER — Other Ambulatory Visit: Payer: Self-pay

## 2020-06-11 ENCOUNTER — Ambulatory Visit (INDEPENDENT_AMBULATORY_CARE_PROVIDER_SITE_OTHER): Payer: PPO | Admitting: Family Medicine

## 2020-06-11 ENCOUNTER — Encounter: Payer: Self-pay | Admitting: Family Medicine

## 2020-06-11 DIAGNOSIS — J029 Acute pharyngitis, unspecified: Secondary | ICD-10-CM

## 2020-06-11 DIAGNOSIS — I480 Paroxysmal atrial fibrillation: Secondary | ICD-10-CM | POA: Diagnosis not present

## 2020-06-11 MED ORDER — AMOXICILLIN-POT CLAVULANATE 875-125 MG PO TABS: 1.0000 | ORAL_TABLET | Freq: Two times a day (BID) | ORAL | 0 refills | Status: DC

## 2020-06-11 NOTE — Progress Notes (Signed)
Virtual telephone visit    Virtual Visit via Telephone Note   This visit type was conducted due to national recommendations for restrictions regarding the COVID-19 Pandemic (e.g. social distancing) in an effort to limit this patient's exposure and mitigate transmission in our community. Due to his co-morbid illnesses, this patient is at least at moderate risk for complications without adequate follow up. This format is felt to be most appropriate for this patient at this time. The patient did not have access to video technology or had technical difficulties with video requiring transitioning to audio format only (telephone). Physical exam was limited to content and character of the telephone converstion.    Patient location: home Provider location: office  I discussed the limitations of evaluation and management by telemedicine and the availability of in person appointments. The patient expressed understanding and agreed to proceed.   Visit Date: 06/11/2020  Today's healthcare provider: [name], PA-C   No chief complaint on file.  Subjective    HPI  The patient is a 79 year old [name] who presents via phone visit with complaint of swollen tonsils.  He states he has been having symptoms for about 1 week.  He has been gargling with saline and mouth wash with no relief.   He has history of tonsillitis in the past that he states had to be drained in the ER. It is of note that the patient's son was diagnosed with bronchitis last week.    Past Medical History:  Diagnosis Date   Allergy    History of chicken pox    Hyperlipidemia    Hypertension    Past Surgical History:  Procedure Laterality Date   BACK SURGERY     x5   DOPPLER ECHOCARDIOGRAPHY  06/21/2013   EF=35% while in Sinus Rhythm   Myocardial perfusion scan  10/2012   severe global LV enlargement. Mild RV enlargement. Mild LVH. Mild mitral and tricuspid insufficency   polyp excision  09/2005   UPPER  GASTROINTESTINAL ENDOSCOPY  03/27/2004   Gastropathy, Gastritis; Duodenopathy   Social History   Tobacco Use   Smoking status: Former Smoker    Packs/day: 2.00    Years: 50.00    Pack years: 100.00    Types: Cigarettes    Quit date: 07/01/2007    Years since quitting: 12.9   Smokeless tobacco: Never Used  Vaping Use   Vaping Use: Never used  Substance Use Topics   Alcohol use: No    Alcohol/week: 0.0 standard drinks   Drug use: No   Family History  Problem Relation Age of Onset   Alzheimer's disease Mother    Heart attack Father    Colon cancer Brother    No Known Allergies    Medications: Outpatient Medications Prior to Visit  Medication Sig   amiodarone (PACERONE) 200 MG tablet 200 mg.    furosemide (LASIX) 20 MG tablet TAKE 1 TABLET(20 MG) BY MOUTH DAILY AS NEEDED FOR FLUID RETENTION   gabapentin (NEURONTIN) 300 MG capsule Take 1 capsule (300 mg total) by mouth 2 (two) times daily.   hydrochlorothiazide (MICROZIDE) 12.5 MG capsule Take 1 capsule (12.5 mg total) by mouth daily.   levothyroxine (SYNTHROID) 100 MCG tablet TAKE 1 TABLET(100 MCG) BY MOUTH DAILY   lisinopril (ZESTRIL) 10 MG tablet Take 1 tablet (10 mg total) by mouth daily.   lovastatin (MEVACOR) 20 MG tablet TAKE 1 TABLET BY MOUTH EVERY DAY   metoprolol succinate (TOPROL-XL) 25 MG 24 hr tablet  1 TABLET BY MOUTH ONCE A DAY   potassium chloride (KLOR-CON) 10 MEQ tablet Take 10 mEq by mouth daily.   XARELTO 20 MG TABS tablet TAKE 1 TABLET BY MOUTH EVERY DAY   No facility-administered medications prior to visit.    Review of Systems  Constitutional: Negative for fatigue and fever.  HENT: Positive for rhinorrhea, sore throat, trouble swallowing and voice change. Negative for ear discharge, ear pain, facial swelling, sinus pressure, sinus pain, sneezing and tinnitus.   Respiratory: Negative for cough, shortness of breath and wheezing.   Cardiovascular: Negative for chest pain.       Objective    There were no vitals taken for this visit.  During telephonic interview, patient was not having dyspnea, cough or wheezing. Normal speech.   Assessment & Plan     1. Sore throat Started a week ago with PND and scratchy throat. No fever but has progressed to a swollen sensation similar to peritonsillar abscess he had in 2018. No cough, loss of taste or chills. Treat with Augmentin and call report of progress in 5-7 days. If no better, or fever over 101, may need to go to the ER for evaluation. Gargle with warm saltwater and may use Tylenol prn. Monitor for COVID symptoms. - amoxicillin-clavulanate (AUGMENTIN) 875-125 MG tablet; Take 1 tablet by mouth 2 (two) times daily.  Dispense: 20 tablet; Refill: 0  2. Paroxysmal atrial fibrillation (HCC) Stable. Controlled by Amiodarone 200 mg qd with Xarelto 20 mg qd. Concerned about infection similar to past peritonsillar abscess.  No follow-ups on file.    I discussed the assessment and treatment plan with the patient. The patient was provided an opportunity to ask questions and all were answered. The patient agreed with the plan and demonstrated an understanding of the instructions.   The patient was advised to call back or seek an in-person evaluation if the symptoms worsen or if the condition fails to improve as anticipated.  I provided 20 minutes of non-face-to-face time during this encounter.  I, Adaleigh Warf, PA-C, have reviewed all documentation for this visit. The documentation on 06/11/20 for the exam, diagnosis, procedures, and orders are all accurate and complete.   [name], PA-C [employer] [phone] (phone) [phone] (fax)  [place]

## 2020-10-01 DIAGNOSIS — I10 Essential (primary) hypertension: Secondary | ICD-10-CM | POA: Diagnosis not present

## 2020-10-01 DIAGNOSIS — Z9889 Other specified postprocedural states: Secondary | ICD-10-CM | POA: Diagnosis not present

## 2020-10-01 DIAGNOSIS — E785 Hyperlipidemia, unspecified: Secondary | ICD-10-CM | POA: Diagnosis not present

## 2020-10-01 DIAGNOSIS — I480 Paroxysmal atrial fibrillation: Secondary | ICD-10-CM | POA: Diagnosis not present

## 2020-10-01 DIAGNOSIS — R0602 Shortness of breath: Secondary | ICD-10-CM | POA: Diagnosis not present

## 2020-10-01 DIAGNOSIS — I5022 Chronic systolic (congestive) heart failure: Secondary | ICD-10-CM | POA: Diagnosis not present

## 2020-10-01 DIAGNOSIS — I420 Dilated cardiomyopathy: Secondary | ICD-10-CM | POA: Diagnosis not present

## 2020-10-29 ENCOUNTER — Other Ambulatory Visit: Payer: Self-pay | Admitting: Family Medicine

## 2020-10-29 ENCOUNTER — Other Ambulatory Visit: Payer: Self-pay

## 2020-10-29 ENCOUNTER — Telehealth: Payer: Self-pay | Admitting: Family Medicine

## 2020-10-29 DIAGNOSIS — E039 Hypothyroidism, unspecified: Secondary | ICD-10-CM

## 2020-10-29 NOTE — Telephone Encounter (Signed)
Medication Refill - Medication:   levothyroxine (SYNTHROID) 100 MCG tablet    Has the patient contacted their pharmacy? Yes.  call the office, prescription needs to be sent to a different pharmacy.    Preferred Pharmacy (with phone number or street name):   [facility] [address] [place], [place] [address] [phone]   Agent: Please be advised that RX refills may take up to 3 business days. We ask that you follow-up with your pharmacy.

## 2020-10-29 NOTE — Telephone Encounter (Signed)
Pt is calling and [facility] will not transfer levothyroxine [facility] [place] phone number [phone]. Pt also would like to verify he is no longer taking 75 mcg. Please call patient back

## 2020-10-29 NOTE — Telephone Encounter (Signed)
Requested medication (s) are due for refill today: medication expired  Requested medication (s) are on the active medication list: yes   Last refill:  10/28/19 #90 0 refills  Future visit scheduled: no , seen 4  months ago   Notes to clinic:  expired medication. Do you want to renew Rx?     Requested Prescriptions  Pending Prescriptions Disp Refills   levothyroxine (SYNTHROID) 100 MCG tablet 90 tablet 0      Endocrinology:  Hypothyroid Agents Failed - 10/29/2020  2:42 PM      Failed - TSH needs to be rechecked within 3 months after an abnormal result. Refill until TSH is due.      Passed - TSH in normal range and within 360 days    TSH  Date Value Ref Range Status  04/04/2020 1.240 0.450 - 4.500 uIU/mL Final          Passed - Valid encounter within last 12 months    Recent Outpatient Visits           4 months ago Sore throat   [place], PA-C   6 months ago Primary hypertension   [facility], Vickki Muff, PA-C   1 year ago Essential hypertension   [facility], Vickki Muff, PA-C   2 years ago Essential hypertension   [facility] Birdie Sons, MD   3 years ago Pre-diabetes   [facility] Caryn Section, Kirstie Peri, MD

## 2020-10-30 MED ORDER — LEVOTHYROXINE SODIUM 100 MCG PO TABS: 100.0000 ug | ORAL_TABLET | Freq: Every day | ORAL | 0 refills | Status: DC

## 2020-10-30 NOTE — Telephone Encounter (Signed)
[place] has changed residence to [place], [place], he will need to get established with a primary care provider there for further follow up and refills.

## 2020-10-30 NOTE — Telephone Encounter (Signed)
Pt called and is requesting to have this sent over. Pt states that he only has 2 days left. Please advise.

## 2021-01-16 ENCOUNTER — Other Ambulatory Visit: Payer: Self-pay | Admitting: Family Medicine

## 2021-01-16 DIAGNOSIS — E039 Hypothyroidism, unspecified: Secondary | ICD-10-CM

## 2021-01-16 NOTE — Telephone Encounter (Signed)
   Notes to clinic:  Review for refill Looks like patient maybe establish we provider somewhere else   Requested Prescriptions  Pending Prescriptions Disp Refills   levothyroxine (SYNTHROID) 100 MCG tablet [Pharmacy Med Name: LEVOTHYROXINE 0.'100MG'$  (100MCG) TAB] 90 tablet 0    Sig: TAKE 1 TABLET(100 MCG) BY MOUTH DAILY      Endocrinology:  Hypothyroid Agents Failed - 01/16/2021 11:26 AM      Failed - TSH needs to be rechecked within 3 months after an abnormal result. Refill until TSH is due.      Passed - TSH in normal range and within 360 days    TSH  Date Value Ref Range Status  04/04/2020 1.240 0.450 - 4.500 uIU/mL Final          Passed - Valid encounter within last 12 months    Recent Outpatient Visits           7 months ago Sore throat   [facility], PA-C   9 months ago Primary hypertension   [facility], Vickki Muff, PA-C   1 year ago Essential hypertension   [facility], Vickki Muff, PA-C   2 years ago Essential hypertension   [facility] Birdie Sons, MD   3 years ago Pre-diabetes   [facility] Caryn Section, Kirstie Peri, MD

## 2021-01-21 ENCOUNTER — Telehealth: Payer: Self-pay | Admitting: *Deleted

## 2021-01-21 NOTE — Chronic Care Management (AMB) (Signed)
  Chronic Care Management   Note  01/21/2021 Name: CIRILO CANNER Sr. MRN: [id number] DOB: 12-Jan-1941  [name] Sr. is a 80 y.o. year old male who is a primary care patient of Chrismon, Vickki Muff, PA-C. I reached out to [place] by phone today in response to a referral sent by Mr. LEONIDES MINDER Sr.'s PCP Chrismon, Vickki Muff, PA-C     Mr. Esterline was given information about Chronic Care Management services today including:  CCM service includes personalized support from designated clinical staff supervised by his physician, including individualized plan of care and coordination with other care providers 24/7 contact phone numbers for assistance for urgent and routine care needs. Service will only be billed when office clinical staff spend 20 minutes or more in a month to coordinate care. Only one practitioner may furnish and bill the service in a calendar month. The patient may stop CCM services at any time (effective at the end of the month) by phone call to the office staff. The patient will be responsible for cost sharing (co-pay) of up to 20% of the service fee (after annual deductible is met).  Patient agreed to services and verbal consent obtained.   Follow up plan: Telephone appointment with care management team member scheduled for: 02/11/2021  Julian Hy, [place] Management  Direct Dial: [phone]

## 2021-02-11 ENCOUNTER — Ambulatory Visit (INDEPENDENT_AMBULATORY_CARE_PROVIDER_SITE_OTHER): Payer: PPO

## 2021-02-11 DIAGNOSIS — I5040 Unspecified combined systolic (congestive) and diastolic (congestive) heart failure: Secondary | ICD-10-CM

## 2021-02-11 DIAGNOSIS — I10 Essential (primary) hypertension: Secondary | ICD-10-CM | POA: Diagnosis not present

## 2021-02-11 NOTE — Chronic Care Management (AMB) (Signed)
Chronic Care Management   CCM RN Visit Note  02/11/2021 Name: [name] YUHAS Sr. MRN: [id number] DOB: [birth year]  Subjective: [name] Friends Sr. is a 80 y.o. year old male who is a primary care patient of Chrismon, Vickki Muff, PA-C. The care management team was consulted for assistance with disease management and care coordination needs.    Engaged with patient by telephone for initial visit in response to provider referral for case management and/or care coordination services.   Consent to Services:  The patient was given the following information about Chronic Care Management services: 1. CCM service includes personalized support from designated clinical staff supervised by the primary care provider, including individualized plan of care and coordination with other care providers 2. 24/7 contact phone numbers for assistance for urgent and routine care needs. 3. Service will only be billed when office clinical staff spend 20 minutes or more in a month to coordinate care. 4. Only one practitioner may furnish and bill the service in a calendar month. 5.The patient may stop CCM services at any time (effective at the end of the month) by phone call to the office staff. 6. The patient will be responsible for cost sharing (co-pay) of up to 20% of the service fee (after annual deductible is met). Patient agreed to services and consent obtained.   Assessment: Review of patient past medical history, allergies, medications, health status, including review of consultants reports, laboratory and other test data, was performed as part of comprehensive evaluation and provision of chronic care management services.   SDOH (Social Determinants of Health) assessments and interventions performed:  SDOH Interventions    Flowsheet Row Most Recent Value  SDOH Interventions   Food Insecurity Interventions Intervention Not Indicated  Transportation Interventions Intervention Not Indicated        CCM Care  Plan  No Known Allergies  Outpatient Encounter Medications as of 02/11/2021  Medication Sig   amiodarone (PACERONE) 200 MG tablet 200 mg.    furosemide (LASIX) 20 MG tablet TAKE 1 TABLET(20 MG) BY MOUTH DAILY AS NEEDED FOR FLUID RETENTION   hydrochlorothiazide (MICROZIDE) 12.5 MG capsule Take 1 capsule (12.5 mg total) by mouth daily.   levothyroxine (SYNTHROID) 100 MCG tablet Take 1 tablet (100 mcg total) by mouth daily before breakfast. Please schedule an office visit before anymore refills.   lisinopril (ZESTRIL) 10 MG tablet Take 1 tablet (10 mg total) by mouth daily.   lovastatin (MEVACOR) 20 MG tablet TAKE 1 TABLET BY MOUTH EVERY DAY   metoprolol succinate (TOPROL-XL) 25 MG 24 hr tablet 1 TABLET BY MOUTH ONCE A DAY   XARELTO 20 MG TABS tablet TAKE 1 TABLET BY MOUTH EVERY DAY   amoxicillin-clavulanate (AUGMENTIN) 875-125 MG tablet Take 1 tablet by mouth 2 (two) times daily.   gabapentin (NEURONTIN) 300 MG capsule Take 1 capsule (300 mg total) by mouth 2 (two) times daily.   potassium chloride (KLOR-CON) 10 MEQ tablet Take 10 mEq by mouth daily. (Patient not taking: No sig reported)   No facility-administered encounter medications on file as of 02/11/2021.    Patient Active Problem List   Diagnosis Date Noted   Chronic pain syndrome 02/13/2020   Lumbar radiculopathy, chronic 02/13/2020   Chronic right SI joint pain 02/13/2020   Osteoarthritis of knee 09/17/2017   Sebaceous cyst 03/30/2017   Pre-diabetes 03/18/2017   Lumbar herniated disc 06/02/2016   Primary osteoarthritis of right hip 08/17/2015   Allergic rhinitis 05/29/2015   Arthritis 05/29/2015   GERD (  gastroesophageal reflux disease) 05/29/2015   Hiatal hernia 05/29/2015   LBP (low back pain) 05/29/2015   Swelling of limb 05/29/2015   Varicose veins of lower extremities with inflammation 05/29/2015   Atrial fibrillation ([place]) 12/19/2014   Hyperlipidemia 12/19/2014   Hypothyroidism 12/19/2014   Hypertension 12/19/2014    History of cardiac catheterization 02/17/2013   Congestive cardiomyopathy ([place]) 02/17/2013   Congestive heart failure, severe ([place]) 10/04/2012    Conditions to be addressed/monitored:CHF, HTN, and HLD  Patient Care Plan: Heart Failure (Adult)     Problem Identified: Symptom Exacerbation (Heart Failure)      Long-Range Goal: Symptom Exacerbation Prevented or Minimized   Start Date: 02/11/2021  Expected End Date: 06/11/2021  Priority: High  Note:   Wt Readings from Last 3 Encounters:  04/03/20 251 lb (113.9 kg)  02/20/20 260 lb (117.9 kg)  02/13/20 260 lb (117.9 kg)     Current Barriers:  Chronic Disease Management support and educational needs related to Congestive Heart Failure.  Case Manager Clinical Goal(s):  Over the next 120 days, patient will not require hospitalization due to complications related to CHF exacerbation.  Interventions:  Collaboration with Chrismon, Vickki Muff, PA-C regarding development and update of comprehensive plan of care as evidenced by provider attestation and co-signature Inter-disciplinary care team collaboration (see longitudinal plan of care) Reviewed medications and importance of compliance. Advised to take medications as prescribed. Encouraged to notify the care management team with concerns regarding medication management or prescription cost. Reviewed plan for CHF self-management. Discussed established weight parameters. Advised to weigh daily and record weights. Encouraged to notify a provider for weight gain greater than 3 lbs overnight or greater than 5 lbs in a week. Reports morning weight of 246 lbs. Discussed importance of monitoring for symptoms of fluid retention. Denies increased lower extremity or abdominal edema. Experiences shortness of breath with exertion. Denies episodes at rest. Reports using inhalers in the past. Not currently using an inhaler. Reports maintaining oxygen saturations at 98%. Discussed nutritional intake. Advised  to continue monitoring sodium intake attempting to adhere to low sodium/cardiac prudent diet. Advised to follow providers recommendation regarding fluid restrictions.  Reviewed s/sx of CHF exacerbation and indications for seeking immediate medical attention.   Self-Care/Patient Goals: Self administer medications as prescribed Attend all scheduled provider appointments Monitor weight and maintain a log Adhere to a low sodium/cardiac diet Call provider office for new questions and concerns if needed    Follow Up Plan:  Will follow up next month    Patient Care Plan: Hypertension and Hyperlipidemia     Problem Identified: Hypertension and Hyperlipidemia      Long-Range Goal: Hypertension and Hyperlipidemia Monitored   Start Date: 02/11/2021  Expected End Date: 06/11/2021  Priority: Medium  Note:   Objective:  Last practice recorded BP readings:  BP Readings from Last 3 Encounters:  04/03/20 107/63  02/20/20 (!) 151/73  02/13/20 (!) 84/48   Most recent eGFR/CrCl: No results found for: EGFR  No components found for: CRCL  Lab Results  Component Value Date   CHOL 152 04/04/2020   HDL 44 04/04/2020   LDLCALC 89 04/04/2020   TRIG 106 04/04/2020   CHOLHDL 3.5 04/07/2018    Current Barriers:  Chronic Disease Management support and educational needs related to Hypertension and Hyperlipidemia.  Case Manager Clinical Goal(s):  Over the next 120 days, patient will demonstrate adherence to prescribed treatment plan as evidenced by taking all medications as prescribed, monitoring and recording blood pressure and adhering to  low sodium/DASH diet.  Interventions:  Collaboration with Chrismon, Vickki Muff, PA-C regarding development and update of comprehensive plan of care as evidenced by provider attestation and co-signature Inter-disciplinary care team collaboration (see longitudinal plan of care) Reviewed medications.  Encouraged to take medications as prescribed and notify provider  if unable to tolerate prescribed regimen. Provided information regarding established blood pressure parameters along with indications for notifying a provider. Encouraged to monitor and record readings. Reports BP today was 118/63. Discussed compliance with recommended cardiac prudent diet. Encouraged to continue adhering to a low sodium diet. Encouraged to read nutrition labels and avoid highly processed foods when possible. Discussed complications of uncontrolled blood pressure. Reviewed s/sx of heart attack, stroke and worsening symptoms that require immediate medical attention.   Patient Goals/Self-Care Activities: Self-administer medications as prescribed Attend medical appointments as scheduled Monitor and record blood pressure Adhere to recommended cardiac prudent/heart healthy diet Notify provider or care management team with questions and new concerns as needed   Follow Up Plan:  Will follow up within the next month           PLAN A member of the care management team will follow up with [name] next month.   [name] [facility] Care Management [facility] [phone]

## 2021-02-14 ENCOUNTER — Other Ambulatory Visit: Payer: Self-pay | Admitting: Student in an Organized Health Care Education/Training Program

## 2021-02-14 DIAGNOSIS — G894 Chronic pain syndrome: Secondary | ICD-10-CM

## 2021-02-14 DIAGNOSIS — G8929 Other chronic pain: Secondary | ICD-10-CM

## 2021-02-14 DIAGNOSIS — M5416 Radiculopathy, lumbar region: Secondary | ICD-10-CM

## 2021-02-14 DIAGNOSIS — M461 Sacroiliitis, not elsewhere classified: Secondary | ICD-10-CM

## 2021-02-14 DIAGNOSIS — M533 Sacrococcygeal disorders, not elsewhere classified: Secondary | ICD-10-CM

## 2021-02-14 DIAGNOSIS — M47818 Spondylosis without myelopathy or radiculopathy, sacral and sacrococcygeal region: Secondary | ICD-10-CM

## 2021-02-18 ENCOUNTER — Telehealth: Payer: Self-pay | Admitting: Student in an Organized Health Care Education/Training Program

## 2021-02-18 ENCOUNTER — Telehealth: Payer: Self-pay | Admitting: Family Medicine

## 2021-02-18 NOTE — Telephone Encounter (Signed)
PT calling stating that he was contacted by health nurse last week regarding his medications. He states that during that call he discussed wanting to have his pregablin refilled by PCP so that he does not have to continue going to pain clinic. Pt states that he is out of medication and that it does help him sleep and help with pain. Please advise.      [facility] [id number] AT [facility] [address]  [place] [address]  Phone: [phone] Fax: [phone]  Hours: Not open 24 hours

## 2021-02-18 NOTE — Telephone Encounter (Signed)
Chart indicates he is on Gabapentin. Should follow up with pain management.

## 2021-02-18 NOTE — Patient Instructions (Signed)
Thank you for allowing the Chronic Care Management team to participate in your care.   Goal Addressed: Patient Care Plan: Heart Failure (Adult)     Problem Identified: Symptom Exacerbation (Heart Failure)      Long-Range Goal: Symptom Exacerbation Prevented or Minimized   Start Date: 02/11/2021  Expected End Date: 06/11/2021  Priority: High  Note:   Wt Readings from Last 3 Encounters:  04/03/20 251 lb (113.9 kg)  02/20/20 260 lb (117.9 kg)  02/13/20 260 lb (117.9 kg)     Current Barriers:  Chronic Disease Management support and educational needs related to Congestive Heart Failure.  Case Manager Clinical Goal(s):  Over the next 120 days, patient will not require hospitalization due to complications related to CHF exacerbation.  Interventions:  Collaboration with Chrismon, Vickki Muff, PA-C regarding development and update of comprehensive plan of care as evidenced by provider attestation and co-signature Inter-disciplinary care team collaboration (see longitudinal plan of care) Reviewed medications and importance of compliance. Advised to take medications as prescribed. Encouraged to notify the care management team with concerns regarding medication management or prescription cost. Reviewed plan for CHF self-management. Discussed established weight parameters. Advised to weigh daily and record weights. Encouraged to notify a provider for weight gain greater than 3 lbs overnight or greater than 5 lbs in a week. Reports morning weight of 246 lbs. Discussed importance of monitoring for symptoms of fluid retention. Denies increased lower extremity or abdominal edema. Experiences shortness of breath with exertion. Denies episodes at rest. Reports using inhalers in the past. Not currently using an inhaler. Reports maintaining oxygen saturations at 98%. Discussed nutritional intake. Advised to continue monitoring sodium intake attempting to adhere to low sodium/cardiac prudent diet. Advised to  follow providers recommendation regarding fluid restrictions.  Reviewed s/sx of CHF exacerbation and indications for seeking immediate medical attention.   Self-Care/Patient Goals: Self administer medications as prescribed Attend all scheduled provider appointments Monitor weight and maintain a log Adhere to a low sodium/cardiac diet Call provider office for new questions and concerns if needed    Follow Up Plan:  Will follow up next month    Patient Care Plan: Hypertension and Hyperlipidemia     Problem Identified: Hypertension and Hyperlipidemia      Long-Range Goal: Hypertension and Hyperlipidemia Monitored   Start Date: 02/11/2021  Expected End Date: 06/11/2021  Priority: Medium  Note:   Objective:  Last practice recorded BP readings:  BP Readings from Last 3 Encounters:  04/03/20 107/63  02/20/20 (!) 151/73  02/13/20 (!) 84/48   Most recent eGFR/CrCl: No results found for: EGFR  No components found for: CRCL  Lab Results  Component Value Date   CHOL 152 04/04/2020   HDL 44 04/04/2020   LDLCALC 89 04/04/2020   TRIG 106 04/04/2020   CHOLHDL 3.5 04/07/2018    Current Barriers:  Chronic Disease Management support and educational needs related to Hypertension and Hyperlipidemia.  Case Manager Clinical Goal(s):  Over the next 120 days, patient will demonstrate adherence to prescribed treatment plan as evidenced by taking all medications as prescribed, monitoring and recording blood pressure and adhering to low sodium/DASH diet.  Interventions:  Collaboration with Chrismon, Vickki Muff, PA-C regarding development and update of comprehensive plan of care as evidenced by provider attestation and co-signature Inter-disciplinary care team collaboration (see longitudinal plan of care) Reviewed medications.  Encouraged to take medications as prescribed and notify provider if unable to tolerate prescribed regimen. Provided information regarding established blood pressure  parameters along with  indications for notifying a provider. Encouraged to monitor and record readings. Reports BP today was 118/63. Discussed compliance with recommended cardiac prudent diet. Encouraged to continue adhering to a low sodium diet. Encouraged to read nutrition labels and avoid highly processed foods when possible. Discussed complications of uncontrolled blood pressure. Reviewed s/sx of heart attack, stroke and worsening symptoms that require immediate medical attention.   Patient Goals/Self-Care Activities: Self-administer medications as prescribed Attend medical appointments as scheduled Monitor and record blood pressure Adhere to recommended cardiac prudent/heart healthy diet Notify provider or care management team with questions and new concerns as needed   Follow Up Plan:  Will follow up within the next month         Mr. Grim verbalized understanding of the information discussed during the telephonic outreach today. Declined need for mailed/printed instructions. A member of the care management team will follow up next month.   [name] [facility] Care Management [facility] [phone]

## 2021-02-18 NOTE — Telephone Encounter (Signed)
Patient lvmail stating he is out of pregabilin, wanted to get a refill. Last appt was Sept of 2021. I offered him an appt. He does not want an appointment. He is going to speak with primary care about this medication.

## 2021-02-19 NOTE — Telephone Encounter (Signed)
Patient advised of Simona Huh' message

## 2021-03-05 ENCOUNTER — Telehealth: Payer: Self-pay | Admitting: Family Medicine

## 2021-03-05 ENCOUNTER — Ambulatory Visit (INDEPENDENT_AMBULATORY_CARE_PROVIDER_SITE_OTHER): Payer: PPO

## 2021-03-05 DIAGNOSIS — I10 Essential (primary) hypertension: Secondary | ICD-10-CM

## 2021-03-05 MED ORDER — LISINOPRIL 10 MG PO TABS: 10.0000 mg | ORAL_TABLET | Freq: Every day | ORAL | 3 refills | Status: DC

## 2021-03-05 NOTE — Chronic Care Management (AMB) (Signed)
Chronic Care Management   CCM RN Visit Note  03/05/2021 Name: [name] PALERMO Sr. MRN: [id number] DOB: May 01, 1941  Subjective: [name] Friends Sr. is a 80 y.o. year old male who is a primary care patient of Chrismon, Vickki Muff, PA-C. The care management team was consulted for assistance with disease management and care coordination needs.    [name] call regarding need for assistance with disease management appointment scheduling. Engaged with patient by telephone for follow up visit in response to provider referral for case management and or care coordination services.   Consent to Services:  The patient was given information about Chronic Care Management services, agreed to services, and gave verbal consent prior to initiation of services.  Please see initial visit note for detailed documentation.    Assessment: Review of patient past medical history, allergies, medications, health status, including review of consultants reports, laboratory and other test data, was performed as part of comprehensive evaluation and provision of chronic care management services.   SDOH (Social Determinants of Health) assessments and interventions performed:  No  CCM Care Plan  No Known Allergies  Outpatient Encounter Medications as of 03/05/2021  Medication Sig   amiodarone (PACERONE) 200 MG tablet 200 mg.    amoxicillin-clavulanate (AUGMENTIN) 875-125 MG tablet Take 1 tablet by mouth 2 (two) times daily.   furosemide (LASIX) 20 MG tablet TAKE 1 TABLET(20 MG) BY MOUTH DAILY AS NEEDED FOR FLUID RETENTION   gabapentin (NEURONTIN) 300 MG capsule Take 1 capsule (300 mg total) by mouth 2 (two) times daily.   hydrochlorothiazide (MICROZIDE) 12.5 MG capsule Take 1 capsule (12.5 mg total) by mouth daily.   levothyroxine (SYNTHROID) 100 MCG tablet Take 1 tablet (100 mcg total) by mouth daily before breakfast. Please schedule an office visit before anymore refills.   lisinopril (ZESTRIL) 10 MG tablet Take 1  tablet (10 mg total) by mouth daily.   lovastatin (MEVACOR) 20 MG tablet TAKE 1 TABLET BY MOUTH EVERY DAY   metoprolol succinate (TOPROL-XL) 25 MG 24 hr tablet 1 TABLET BY MOUTH ONCE A DAY   potassium chloride (KLOR-CON) 10 MEQ tablet Take 10 mEq by mouth daily. (Patient not taking: No sig reported)   XARELTO 20 MG TABS tablet TAKE 1 TABLET BY MOUTH EVERY DAY   No facility-administered encounter medications on file as of 03/05/2021.    Patient Active Problem List   Diagnosis Date Noted   Chronic pain syndrome 02/13/2020   Lumbar radiculopathy, chronic 02/13/2020   Chronic right SI joint pain 02/13/2020   Osteoarthritis of knee 09/17/2017   Sebaceous cyst 03/30/2017   Pre-diabetes 03/18/2017   Lumbar herniated disc 06/02/2016   Primary osteoarthritis of right hip 08/17/2015   Allergic rhinitis 05/29/2015   Arthritis 05/29/2015   GERD (gastroesophageal reflux disease) 05/29/2015   Hiatal hernia 05/29/2015   LBP (low back pain) 05/29/2015   Swelling of limb 05/29/2015   Varicose veins of lower extremities with inflammation 05/29/2015   Atrial fibrillation ([place]) 12/19/2014   Hyperlipidemia 12/19/2014   Hypothyroidism 12/19/2014   Hypertension 12/19/2014   History of cardiac catheterization 02/17/2013   Congestive cardiomyopathy ([place]) 02/17/2013   Congestive heart failure, severe ([place]) 10/04/2012    Conditions to be addressed/monitored:HTN and Gastrointestinal Discomfort  Patient Care Plan: Hypertension and Hyperlipidemia     Problem Identified: Hypertension and Hyperlipidemia      Long-Range Goal: Hypertension and Hyperlipidemia Monitored   Start Date: 02/11/2021  Expected End Date: 06/11/2021  Priority: Medium  Note:   Objective:  Last practice recorded BP readings:   Current Barriers:  Chronic Disease Management support and educational needs related to Hypertension and Hyperlipidemia.  Case Manager Clinical Goal(s):  Over the next 120 days, patient will demonstrate  adherence to prescribed treatment plan as evidenced by taking all medications as prescribed, monitoring and recording blood pressure and adhering to low sodium/DASH diet.  Interventions:  Collaboration with Chrismon, Vickki Muff, PA-C regarding development and update of comprehensive plan of care as evidenced by provider attestation and co-signature Inter-disciplinary care team collaboration (see longitudinal plan of care) Reviewed medications and compliance with treatment plan. Discussed established blood pressure parameters along with indications for notifying a provider. Reports compliance with treatment plan. Reports recent episodes of discomfort and GI upset. Denies chest pain, palpitations or shortness of breath. He will be evaluated by his provider this week. Encouraged to continue adhering to a low sodium diet. Encouraged to read nutrition labels and avoid highly processed foods when possible. Reviewed s/sx of heart attack, stroke and worsening symptoms that require immediate medical attention.   Patient Goals/Self-Care Activities: Self-administer medications as prescribed Attend medical appointments as scheduled Monitor and record blood pressure Adhere to recommended cardiac prudent/heart healthy diet Notify provider or care management team with questions and new concerns as needed   Follow Up Plan:  Will follow up within the next month         PLAN [name] will follow up with a provider as scheduled on 03/07/21. A member of the care management team will follow up within the next month.   [name] Care Management [facility] [phone]

## 2021-03-05 NOTE — Telephone Encounter (Signed)
Medication:lisinopril (ZESTRIL) 10 MG tablet [id number]    Has the pt contacted their pharmacy?Yes was told to call   Patient states that he only has one pill left.     Preferred pharmacy:[facility] [id number] [address], [place] AT [facility] OF [address]  [place], [place] [address]  Phone:  [phone]  Fax:  [phone]     Please be advised refills may take up to 3 business days.  We ask that you follow up with your pharmacy.

## 2021-03-07 ENCOUNTER — Ambulatory Visit (INDEPENDENT_AMBULATORY_CARE_PROVIDER_SITE_OTHER): Payer: PPO | Admitting: Family Medicine

## 2021-03-07 ENCOUNTER — Encounter: Payer: Self-pay | Admitting: Family Medicine

## 2021-03-07 ENCOUNTER — Other Ambulatory Visit: Payer: Self-pay

## 2021-03-07 VITALS — BP 124/47 | HR 54 | Wt 247.0 lb

## 2021-03-07 DIAGNOSIS — K5792 Diverticulitis of intestine, part unspecified, without perforation or abscess without bleeding: Secondary | ICD-10-CM

## 2021-03-07 MED ORDER — AMOXICILLIN-POT CLAVULANATE 875-125 MG PO TABS: 1.0000 | ORAL_TABLET | Freq: Two times a day (BID) | ORAL | 0 refills | Status: DC

## 2021-03-07 NOTE — Progress Notes (Signed)
Established patient visit   Patient: [name] SPAULDING Sr.   DOB: [birth year]   80 y.o. Male  MRN: [id number] Visit Date: 03/07/2021  Today's healthcare provider: [name], PA-C   Chief Complaint  Patient presents with   Abdominal Pain    Left lower abdominal pain started last week.     Subjective    Abdominal Pain This is a new problem. The current episode started in the past 7 days. The problem has been gradually improving. The pain is located in the LLQ. The pain is moderate. The quality of the pain is aching. The abdominal pain does not radiate. Associated symptoms include arthralgias. Pertinent negatives include no constipation, diarrhea, dysuria, frequency, headaches, myalgias, nausea or vomiting. He has tried nothing for the symptoms.  HPI     Abdominal Pain    Additional comments: Left lower abdominal pain started last week.        Last edited by Kizzie Furnish, [place] on 03/07/2021  2:38 PM.      Past Medical History:  Diagnosis Date   Allergy    History of chicken pox    Hyperlipidemia    Hypertension    Past Surgical History:  Procedure Laterality Date   BACK SURGERY     x5   DOPPLER ECHOCARDIOGRAPHY  06/21/2013   EF=35% while in Sinus Rhythm   Myocardial perfusion scan  10/2012   severe global LV enlargement. Mild RV enlargement. Mild LVH. Mild mitral and tricuspid insufficency   polyp excision  09/2005   UPPER GASTROINTESTINAL ENDOSCOPY  03/27/2004   Gastropathy, Gastritis; Duodenopathy   Family History  Problem Relation Age of Onset   Alzheimer's disease Mother    Heart attack Father    Colon cancer Brother    Social History   Tobacco Use   Smoking status: Former    Packs/day: 2.00    Years: 50.00    Pack years: 100.00    Types: Cigarettes    Quit date: 07/01/2007    Years since quitting: 13.6   Smokeless tobacco: Never  Vaping Use   Vaping Use: Never used  Substance Use Topics   Alcohol use: No    Alcohol/week: 0.0 standard  drinks   Drug use: No   No Known Allergies  Medications: Outpatient Medications Prior to Visit  Medication Sig   amiodarone (PACERONE) 200 MG tablet 200 mg.    furosemide (LASIX) 20 MG tablet TAKE 1 TABLET(20 MG) BY MOUTH DAILY AS NEEDED FOR FLUID RETENTION   hydrochlorothiazide (MICROZIDE) 12.5 MG capsule Take 1 capsule (12.5 mg total) by mouth daily.   levothyroxine (SYNTHROID) 100 MCG tablet Take 1 tablet (100 mcg total) by mouth daily before breakfast. Please schedule an office visit before anymore refills.   lisinopril (ZESTRIL) 10 MG tablet Take 1 tablet (10 mg total) by mouth daily.   lovastatin (MEVACOR) 20 MG tablet TAKE 1 TABLET BY MOUTH EVERY DAY   metoprolol succinate (TOPROL-XL) 25 MG 24 hr tablet 1 TABLET BY MOUTH ONCE A DAY   XARELTO 20 MG TABS tablet TAKE 1 TABLET BY MOUTH EVERY DAY   amoxicillin-clavulanate (AUGMENTIN) 875-125 MG tablet Take 1 tablet by mouth 2 (two) times daily.   gabapentin (NEURONTIN) 300 MG capsule Take 1 capsule (300 mg total) by mouth 2 (two) times daily.   potassium chloride (KLOR-CON) 10 MEQ tablet Take 10 mEq by mouth daily.   No facility-administered medications prior to visit.    Review of Systems  Constitutional:  Negative.   Respiratory: Negative.    Cardiovascular: Negative.   Gastrointestinal:  Positive for abdominal pain. Negative for abdominal distention, anal bleeding, blood in stool, constipation, diarrhea, nausea, rectal pain and vomiting.  Genitourinary: Negative.  Negative for dysuria and frequency.  Musculoskeletal:  Positive for arthralgias and gait problem. Negative for back pain, joint swelling, myalgias, neck pain and neck stiffness.  Neurological:  Negative for dizziness, light-headedness and headaches.      Objective    BP (!) 124/47 (BP Location: Right Arm, Patient Position: Sitting, Cuff Size: Large)   Pulse (!) 54   Wt 247 lb (112 kg)   SpO2 100%   BMI 31.71 kg/m   Physical Exam Constitutional:      General:  He is not in acute distress.    Appearance: He is well-developed.  HENT:     Head: Normocephalic and atraumatic.     Right Ear: Hearing normal.     Left Ear: Hearing normal.     Nose: Nose normal.  Eyes:     General: Lids are normal. No scleral icterus.       Right eye: No discharge.        Left eye: No discharge.     Conjunctiva/sclera: Conjunctivae normal.  Cardiovascular:     Rate and Rhythm: Regular rhythm.     Heart sounds: Normal heart sounds.  Pulmonary:     Effort: Pulmonary effort is normal. No respiratory distress.  Abdominal:     General: Abdomen is flat. Bowel sounds are normal.     Palpations: Abdomen is soft.     Tenderness: There is abdominal tenderness.     Hernia: No hernia is present.     Comments: Mild LLQ tenderness. No masses, rebound or rigidity.  Musculoskeletal:        General: Normal range of motion.  Skin:    Findings: No lesion or rash.  Neurological:     Mental Status: He is alert and oriented to person, place, and time.  Psychiatric:        Speech: Speech normal.        Behavior: Behavior normal.        Thought Content: Thought content normal.      No results found for any visits on 03/07/21.  Assessment & Plan     1. Diverticulitis Developed LLQ tenderness over the past several days without fever or blood in stools. Recommend soft/liquid diet and add antibiotic over the next 3-4 days. - amoxicillin-clavulanate (AUGMENTIN) 875-125 MG tablet; Take 1 tablet by mouth 2 (two) times daily.  Dispense: 20 tablet; Refill: 0   No follow-ups on file.      I, Kaamil Morefield, PA-C, have reviewed all documentation for this visit. The documentation on 03/07/21 for the exam, diagnosis, procedures, and orders are all accurate and complete.    [name], PA-C  [employer] [phone] (phone) [phone] (fax)  [place]

## 2021-03-07 NOTE — Patient Instructions (Signed)
Diverticulitis Diverticulitis is infection or inflammation of small pouches (diverticula) in the colon that form due to a condition called diverticulosis. Diverticula can trap stool (feces) and bacteria, causing infection and inflammation. Diverticulitis may cause severe stomach pain and diarrhea. It may lead to tissue damage in the colon that causes bleeding or blockage. The diverticula may also burst (rupture) and cause infected stool to enter other areas of the abdomen. What are the causes? This condition is caused by stool becoming trapped in the diverticula, which allows bacteria to grow in the diverticula. This leads to inflammation and infection. What increases the risk? You are more likely to develop this condition if you have diverticulosis. The risk increases if you: Are overweight or obese. Do not get enough exercise. Drink alcohol. Use tobacco products. Eat a diet that has a lot of red meat such as beef, pork, or lamb. Eat a diet that does not include enough fiber. High-fiber foods include fruits, vegetables, beans, nuts, and whole grains. Are over 40 years of age. What are the signs or symptoms? Symptoms of this condition may include: Pain and tenderness in the abdomen. The pain is normally located on the left side of the abdomen, but it may occur in other areas. Fever and chills. Nausea. Vomiting. Cramping. Bloating. Changes in bowel routines. Blood in your stool. How is this diagnosed? This condition is diagnosed based on: Your medical history. A physical exam. Tests to make sure there is nothing else causing your condition. These tests may include: Blood tests. Urine tests. CT scan of the abdomen. How is this treated? Most cases of this condition are mild and can be treated at home. Treatment may include: Taking over-the-counter pain medicines. Following a clear liquid diet. Taking antibiotic medicines by mouth. Resting. More severe cases may need to be treated  at a hospital. Treatment may include: Not eating or drinking. Taking prescription pain medicine. Receiving antibiotic medicines through an IV. Receiving fluids and nutrition through an IV. Surgery. When your condition is under control, your health care provider may recommend that you have a colonoscopy. This is an exam to look at the entire large intestine. During the exam, a lubricated, bendable tube is inserted into the anus and then passed into the rectum, colon, and other parts of the large intestine. A colonoscopy can show how severe your diverticula are and whether something else may be causing your symptoms. Follow these instructions at home: Medicines Take over-the-counter and prescription medicines only as told by your health care provider. These include fiber supplements, probiotics, and stool softeners. If you were prescribed an antibiotic medicine, take it as told by your health care provider. Do not stop taking the antibiotic even if you start to feel better. Ask your health care provider if the medicine prescribed to you requires you to avoid driving or using machinery. Eating and drinking  Follow a full liquid diet or another diet as directed by your health care provider. After your symptoms improve, your health care provider may tell you to change your diet. He or she may recommend that you eat a diet that contains at least 25 grams (25 g) of fiber daily. Fiber makes it easier to pass stool. Healthy sources of fiber include: Berries. One cup contains 4-8 grams of fiber. Beans or lentils. One-half cup contains 5-8 grams of fiber. Green vegetables. One cup contains 4 grams of fiber. Avoid eating red meat. General instructions Do not use any products that contain nicotine or tobacco, such as cigarettes,   e-cigarettes, and chewing tobacco. If you need help quitting, ask your health care provider. Exercise for at least 30 minutes, 3 times each week. You should exercise hard enough to  raise your heart rate and break a sweat. Keep all follow-up visits as told by your health care provider. This is important. You may need to have a colonoscopy. Contact a health care provider if: Your pain does not improve. Your bowel movements do not return to normal. Get help right away if: Your pain gets worse. Your symptoms do not get better with treatment. Your symptoms suddenly get worse. You have a fever. You vomit more than one time. You have stools that are bloody, black, or tarry. Summary Diverticulitis is infection or inflammation of small pouches (diverticula) in the colon that form due to a condition called diverticulosis. Diverticula can trap stool (feces) and bacteria, causing infection and inflammation. You are at higher risk for this condition if you have diverticulosis and you eat a diet that does not include enough fiber. Most cases of this condition are mild and can be treated at home. More severe cases may need to be treated at a hospital. When your condition is under control, your health care provider may recommend that you have an exam called a colonoscopy. This exam can show how severe your diverticula are and whether something else may be causing your symptoms. Keep all follow-up visits as told by your health care provider. This is important. This information is not intended to replace advice given to you by your health care provider. Make sure you discuss any questions you have with your health care provider. Document Revised: 03/28/2019 Document Reviewed: 03/28/2019 Elsevier Patient Education  2022 Elsevier Inc.  

## 2021-03-11 NOTE — Patient Instructions (Signed)
Thank you for allowing the Chronic Care Management team to participate in your care.   Patient Care Plan: Hypertension and Hyperlipidemia     Problem Identified: Hypertension and Hyperlipidemia      Long-Range Goal: Hypertension and Hyperlipidemia Monitored   Start Date: 02/11/2021  Expected End Date: 06/11/2021  Priority: Medium  Note:   Objective:  Last practice recorded BP readings:   Current Barriers:  Chronic Disease Management support and educational needs related to Hypertension and Hyperlipidemia.  Case Manager Clinical Goal(s):  Over the next 120 days, patient will demonstrate adherence to prescribed treatment plan as evidenced by taking all medications as prescribed, monitoring and recording blood pressure and adhering to low sodium/DASH diet.  Interventions:  Collaboration with Chrismon, Vickki Muff, PA-C regarding development and update of comprehensive plan of care as evidenced by provider attestation and co-signature Inter-disciplinary care team collaboration (see longitudinal plan of care) Reviewed medications and compliance with treatment plan. Discussed established blood pressure parameters along with indications for notifying a provider. Reports compliance with treatment plan. Reports recent episodes of discomfort and GI upset. Denies chest pain, palpitations or shortness of breath. He will be evaluated by his provider this week. Encouraged to continue adhering to a low sodium diet. Encouraged to read nutrition labels and avoid highly processed foods when possible. Reviewed s/sx of heart attack, stroke and worsening symptoms that require immediate medical attention.   Patient Goals/Self-Care Activities: Self-administer medications as prescribed Attend medical appointments as scheduled Monitor and record blood pressure Adhere to recommended cardiac prudent/heart healthy diet Notify provider or care management team with questions and new concerns as needed   Follow  Up Plan:  Will follow up within the next month          [name] will follow up with a provider as scheduled on 03/07/21. He verbalized understanding of the information discussed during the telephonic outreach today. Does not require mailed/printed instructions. A member of the care management team will follow up within the next month.   [name] Care Management [facility] [phone]

## 2021-03-15 ENCOUNTER — Ambulatory Visit: Payer: PPO

## 2021-03-15 DIAGNOSIS — I10 Essential (primary) hypertension: Secondary | ICD-10-CM

## 2021-03-15 DIAGNOSIS — I5040 Unspecified combined systolic (congestive) and diastolic (congestive) heart failure: Secondary | ICD-10-CM

## 2021-03-15 DIAGNOSIS — E785 Hyperlipidemia, unspecified: Secondary | ICD-10-CM

## 2021-03-15 DIAGNOSIS — K5792 Diverticulitis of intestine, part unspecified, without perforation or abscess without bleeding: Secondary | ICD-10-CM

## 2021-03-15 NOTE — Chronic Care Management (AMB) (Signed)
Chronic Care Management   CCM RN Visit Note  03/15/2021 Name: [name] ROBERTSON Sr. MRN: [id number] DOB: 1940/12/29  Subjective: [name] Friends Sr. is a 80 y.o. year old male who is a primary care patient of Chrismon, Vickki Muff, PA-C. The care management team was consulted for assistance with disease management and care coordination needs.    Engaged with patient by telephone for follow up visit in response to provider referral for case management and care coordination services.   Consent to Services:  The patient was given information about Chronic Care Management services, agreed to services, and gave verbal consent prior to initiation of services.  Please see initial visit note for detailed documentation.    Assessment: Review of patient past medical history, allergies, medications, health status, including review of consultants reports, laboratory and other test data, was performed as part of comprehensive evaluation and provision of chronic care management services.   SDOH (Social Determinants of Health) assessments and interventions performed:  No  CCM Care Plan  No Known Allergies  Outpatient Encounter Medications as of 03/15/2021  Medication Sig   amiodarone (PACERONE) 200 MG tablet 200 mg.    amoxicillin-clavulanate (AUGMENTIN) 875-125 MG tablet Take 1 tablet by mouth 2 (two) times daily.   furosemide (LASIX) 20 MG tablet TAKE 1 TABLET(20 MG) BY MOUTH DAILY AS NEEDED FOR FLUID RETENTION   gabapentin (NEURONTIN) 300 MG capsule Take 1 capsule (300 mg total) by mouth 2 (two) times daily.   hydrochlorothiazide (MICROZIDE) 12.5 MG capsule Take 1 capsule (12.5 mg total) by mouth daily.   levothyroxine (SYNTHROID) 100 MCG tablet Take 1 tablet (100 mcg total) by mouth daily before breakfast. Please schedule an office visit before anymore refills.   lisinopril (ZESTRIL) 10 MG tablet Take 1 tablet (10 mg total) by mouth daily.   lovastatin (MEVACOR) 20 MG tablet TAKE 1 TABLET BY MOUTH  EVERY DAY   metoprolol succinate (TOPROL-XL) 25 MG 24 hr tablet 1 TABLET BY MOUTH ONCE A DAY   potassium chloride (KLOR-CON) 10 MEQ tablet Take 10 mEq by mouth daily.   XARELTO 20 MG TABS tablet TAKE 1 TABLET BY MOUTH EVERY DAY   No facility-administered encounter medications on file as of 03/15/2021.    Patient Active Problem List   Diagnosis Date Noted   Chronic pain syndrome 02/13/2020   Lumbar radiculopathy, chronic 02/13/2020   Chronic right SI joint pain 02/13/2020   Osteoarthritis of knee 09/17/2017   Sebaceous cyst 03/30/2017   Pre-diabetes 03/18/2017   Lumbar herniated disc 06/02/2016   Primary osteoarthritis of right hip 08/17/2015   Allergic rhinitis 05/29/2015   Arthritis 05/29/2015   GERD (gastroesophageal reflux disease) 05/29/2015   Hiatal hernia 05/29/2015   LBP (low back pain) 05/29/2015   Swelling of limb 05/29/2015   Varicose veins of lower extremities with inflammation 05/29/2015   Atrial fibrillation ([place]) 12/19/2014   Hyperlipidemia 12/19/2014   Hypothyroidism 12/19/2014   Hypertension 12/19/2014   History of cardiac catheterization 02/17/2013   Congestive cardiomyopathy ([place]) 02/17/2013   Congestive heart failure, severe ([place]) 10/04/2012    Conditions to be addressed/monitored:CHF, HTN, and HLD Patient Care Plan: Heart Failure (Adult)     Problem Identified: Symptom Exacerbation (Heart Failure)      Long-Range Goal: Symptom Exacerbation Prevented or Minimized   Start Date: 02/11/2021  Expected End Date: 06/11/2021  Priority: High  Note:   Wt Readings from Last 3 Encounters:  04/03/20 251 lb (113.9 kg)  02/20/20 260 lb (117.9 kg)  02/13/20 260 lb (117.9 kg)     Current Barriers:  Chronic Disease Management support and educational needs related to Congestive Heart Failure.  Case Manager Clinical Goal(s):  Over the next 120 days, patient will not require hospitalization due to complications related to CHF exacerbation.  Interventions:   Collaboration with Chrismon, Vickki Muff, PA-C regarding development and update of comprehensive plan of care as evidenced by provider attestation and co-signature Inter-disciplinary care team collaboration (see longitudinal plan of care) Reviewed medications and importance of compliance. Reports taking medications as advised. Requested assistance with obtaining needed refills. Will contact [employer]. Reviewed plan for CHF self-management. Reviewed established weight parameters. Currently weighing weekly. Reports weights have been within the established range. Weight this week was 247 lbs.  Discussed importance of monitoring for symptoms of fluid retention. Continues to experience dyspnea with exertion. Denies symptoms at rest. Denies increased lower extremity or abdominal edema. He does not use inhalers or require supplemental oxygen. Reports maintaining oxygen saturations between 97% to 100% room air. Reviewed nutritional intake. Advised to continue monitoring sodium intake attempting to adhere to low sodium/cardiac prudent diet. Reviewed s/sx of CHF exacerbation and indications for seeking immediate medical attention.   Self-Care/Patient Goals: Self administer medications as prescribed Attend all scheduled provider appointments Monitor weight and maintain a log Adhere to a low sodium/cardiac diet Call provider office for new questions and concerns if needed    Follow Up Plan:  Will follow up next month    Patient Care Plan: Hypertension and Hyperlipidemia     Problem Identified: Hypertension and Hyperlipidemia      Long-Range Goal: Hypertension and Hyperlipidemia Monitored   Start Date: 02/11/2021  Expected End Date: 06/11/2021  Priority: Medium  Note:     Current Barriers:  Chronic Disease Management support and educational needs related to Hypertension and Hyperlipidemia.  Case Manager Clinical Goal(s):  Over the next 120 days, patient will demonstrate adherence to  prescribed treatment plan as evidenced by taking all medications as prescribed, monitoring and recording blood pressure and adhering to low sodium/DASH diet.  Interventions:  Collaboration with Chrismon, Vickki Muff, PA-C regarding development and update of comprehensive plan of care as evidenced by provider attestation and co-signature Inter-disciplinary care team collaboration (see longitudinal plan of care) Reviewed medications and compliance with treatment plan. Reviewed established blood pressure parameters along with indications for notifying a provider. Reports compliance with treatment plan. Reports most blood pressure readings have been within range with some low readings. Denies episodes of dizziness or lightheadedness. He is aware of indications for contacting the clinic. Encouraged to continue adhering to a low sodium diet. Attempting to maintain the recommended diet but reports meal consumption has changed d/t episodes of GI upset. He was evaluated in the clinic. Reports symptoms were related to diverticulitis.  Currently consuming soft foods as advised. He is aware of need to follow up if symptoms worsen. Reviewed s/sx of heart attack, stroke and worsening symptoms that require immediate medical attention.   Patient Goals/Self-Care Activities: Self-administer medications as prescribed Attend medical appointments as scheduled Monitor and record blood pressure Adhere to recommended cardiac prudent/heart healthy diet Notify provider or care management team with questions and new concerns as needed   Follow Up Plan:  Will follow up within the next month          PLAN A member of the care management team will follow up next month.   [name] [facility] Care Management [facility] [phone]

## 2021-03-15 NOTE — Patient Instructions (Addendum)
Thank you for allowing the Chronic Care Management team to participate in your care.   Goals Addressed: Patient Care Plan: Heart Failure (Adult)     Problem Identified: Symptom Exacerbation (Heart Failure)      Long-Range Goal: Symptom Exacerbation Prevented or Minimized   Start Date: 02/11/2021  Expected End Date: 06/11/2021  Priority: High  Note:   Wt Readings from Last 3 Encounters:  04/03/20 251 lb (113.9 kg)  02/20/20 260 lb (117.9 kg)  02/13/20 260 lb (117.9 kg)     Current Barriers:  Chronic Disease Management support and educational needs related to Congestive Heart Failure.  Case Manager Clinical Goal(s):  Over the next 120 days, patient will not require hospitalization due to complications related to CHF exacerbation.  Interventions:  Collaboration with Chrismon, Vickki Muff, PA-C regarding development and update of comprehensive plan of care as evidenced by provider attestation and co-signature Inter-disciplinary care team collaboration (see longitudinal plan of care) Reviewed medications and importance of compliance. Reports taking medications as advised. Requested assistance with obtaining needed refills. Will contact [employer]. Reviewed plan for CHF self-management. Reviewed established weight parameters. Currently weighing weekly. Reports weights have been within the established range. Weight this week was 247 lbs.  Discussed importance of monitoring for symptoms of fluid retention. Continues to experience dyspnea with exertion. Denies symptoms at rest. Denies increased lower extremity or abdominal edema. He does not use inhalers or require supplemental oxygen. Reports maintaining oxygen saturations between 97% to 100% room air. Reviewed nutritional intake. Advised to continue monitoring sodium intake attempting to adhere to low sodium/cardiac prudent diet. Reviewed s/sx of CHF exacerbation and indications for seeking immediate medical  attention.   Self-Care/Patient Goals: Self administer medications as prescribed Attend all scheduled provider appointments Monitor weight and maintain a log Adhere to a low sodium/cardiac diet Call provider office for new questions and concerns if needed    Follow Up Plan:  Will follow up next month    Patient Care Plan: Hypertension and Hyperlipidemia     Problem Identified: Hypertension and Hyperlipidemia      Long-Range Goal: Hypertension and Hyperlipidemia Monitored   Start Date: 02/11/2021  Expected End Date: 06/11/2021  Priority: Medium  Note:     Current Barriers:  Chronic Disease Management support and educational needs related to Hypertension and Hyperlipidemia.  Case Manager Clinical Goal(s):  Over the next 120 days, patient will demonstrate adherence to prescribed treatment plan as evidenced by taking all medications as prescribed, monitoring and recording blood pressure and adhering to low sodium/DASH diet.  Interventions:  Collaboration with Chrismon, Vickki Muff, PA-C regarding development and update of comprehensive plan of care as evidenced by provider attestation and co-signature Inter-disciplinary care team collaboration (see longitudinal plan of care) Reviewed medications and compliance with treatment plan. Reviewed established blood pressure parameters along with indications for notifying a provider. Reports compliance with treatment plan. Reports most blood pressure readings have been within range with some low readings. Denies episodes of dizziness or lightheadedness. He is aware of indications for contacting the clinic. Encouraged to continue adhering to a low sodium diet. Attempting to maintain the recommended diet but reports meal consumption has changed d/t episodes of GI upset. He was evaluated in the clinic. Reports symptoms were related to diverticulitis.  Currently consuming soft foods as advised. He is aware of need to follow up if symptoms  worsen. Reviewed s/sx of heart attack, stroke and worsening symptoms that require immediate medical attention.   Patient Goals/Self-Care Activities: Self-administer medications as  prescribed Attend medical appointments as scheduled Monitor and record blood pressure Adhere to recommended cardiac prudent/heart healthy diet Notify provider or care management team with questions and new concerns as needed   Follow Up Plan:  Will follow up within the next month          [name] verbalized understanding of the information discussed during the telephonic outreach. Declined need for mailed/printed instructions. A member of the care management team will follow up next month.    [name] [facility] Care Management [facility] [phone]

## 2021-03-29 DIAGNOSIS — I5040 Unspecified combined systolic (congestive) and diastolic (congestive) heart failure: Secondary | ICD-10-CM

## 2021-03-29 DIAGNOSIS — E785 Hyperlipidemia, unspecified: Secondary | ICD-10-CM | POA: Diagnosis not present

## 2021-03-29 DIAGNOSIS — I10 Essential (primary) hypertension: Secondary | ICD-10-CM | POA: Diagnosis not present

## 2021-04-01 ENCOUNTER — Telehealth: Payer: Self-pay

## 2021-04-01 NOTE — Telephone Encounter (Addendum)
  Care Management   Follow Up Note   04/01/2021 Name: [name] AU Sr. MRN: [id number] DOB: July 14, 1940   Primary Care Provider: Margo Common, PA-C Reason for referral : Chronic Care Management   An unsuccessful telephone outreach was attempted today. The patient was referred to the case management team for assistance with care management and care coordination.    Follow Up Plan A member of the care management team will follow later this month.   [name] [facility] Care Management [facility] [phone]

## 2021-04-07 ENCOUNTER — Other Ambulatory Visit: Payer: Self-pay | Admitting: Family Medicine

## 2021-04-07 DIAGNOSIS — E039 Hypothyroidism, unspecified: Secondary | ICD-10-CM

## 2021-04-09 ENCOUNTER — Telehealth: Payer: Self-pay

## 2021-04-09 NOTE — Telephone Encounter (Signed)
Copied from [place] [phone]. Topic: General - Other >> Apr 09, 2021  4:20 PM Celene Kras wrote: Reason for CRM: Pt called and is requesting to have a Cpe scheduled before the end of the year. Please advise.

## 2021-05-10 ENCOUNTER — Ambulatory Visit (INDEPENDENT_AMBULATORY_CARE_PROVIDER_SITE_OTHER): Payer: PPO

## 2021-05-10 DIAGNOSIS — I10 Essential (primary) hypertension: Secondary | ICD-10-CM

## 2021-05-10 DIAGNOSIS — E785 Hyperlipidemia, unspecified: Secondary | ICD-10-CM

## 2021-05-10 DIAGNOSIS — I5040 Unspecified combined systolic (congestive) and diastolic (congestive) heart failure: Secondary | ICD-10-CM

## 2021-05-10 NOTE — Patient Instructions (Signed)
Thank you for allowing the Chronic Care Management team to participate in your care.  

## 2021-05-10 NOTE — Chronic Care Management (AMB) (Addendum)
Chronic Care Management   CCM RN Visit Note  05/10/2021 Name: [name] REDMANN Sr. MRN: [id number] DOB: Jan 28, 1941  Subjective: [name] Friends Sr. is a 80 y.o. year old male who is a primary care patient of [name] Sprout, FNP. The care management team was consulted for assistance with disease management and care coordination needs.    Engaged with patient by telephone for follow up visit in response to provider referral for case management and care coordination services.   Consent to Services:  The patient was given information about Chronic Care Management services, agreed to services, and gave verbal consent prior to initiation of services.  Please see initial visit note for detailed documentation.    Assessment: Review of patient past medical history, allergies, medications, health status, including review of consultants reports, laboratory and other test data, was performed as part of comprehensive evaluation and provision of chronic care management services.   SDOH (Social Determinants of Health) assessments and interventions performed:  No  CCM Care Plan  No Known Allergies  Outpatient Encounter Medications as of 05/10/2021  Medication Sig   amiodarone (PACERONE) 200 MG tablet 200 mg.    amoxicillin-clavulanate (AUGMENTIN) 875-125 MG tablet Take 1 tablet by mouth 2 (two) times daily.   furosemide (LASIX) 20 MG tablet TAKE 1 TABLET(20 MG) BY MOUTH DAILY AS NEEDED FOR FLUID RETENTION   gabapentin (NEURONTIN) 300 MG capsule Take 1 capsule (300 mg total) by mouth 2 (two) times daily.   hydrochlorothiazide (MICROZIDE) 12.5 MG capsule Take 1 capsule (12.5 mg total) by mouth daily.   levothyroxine (SYNTHROID) 100 MCG tablet TAKE 1 TABLET(100 MCG) BY MOUTH DAILY BEFORE BREAKFAST   lisinopril (ZESTRIL) 10 MG tablet Take 1 tablet (10 mg total) by mouth daily.   lovastatin (MEVACOR) 20 MG tablet TAKE 1 TABLET BY MOUTH EVERY DAY   metoprolol succinate (TOPROL-XL) 25 MG 24 hr tablet 1  TABLET BY MOUTH ONCE A DAY   potassium chloride (KLOR-CON) 10 MEQ tablet Take 10 mEq by mouth daily.   XARELTO 20 MG TABS tablet TAKE 1 TABLET BY MOUTH EVERY DAY   No facility-administered encounter medications on file as of 05/10/2021.    Patient Active Problem List   Diagnosis Date Noted   Chronic pain syndrome 02/13/2020   Lumbar radiculopathy, chronic 02/13/2020   Chronic right SI joint pain 02/13/2020   Osteoarthritis of knee 09/17/2017   Sebaceous cyst 03/30/2017   Pre-diabetes 03/18/2017   Lumbar herniated disc 06/02/2016   Primary osteoarthritis of right hip 08/17/2015   Allergic rhinitis 05/29/2015   Arthritis 05/29/2015   GERD (gastroesophageal reflux disease) 05/29/2015   Hiatal hernia 05/29/2015   LBP (low back pain) 05/29/2015   Swelling of limb 05/29/2015   Varicose veins of lower extremities with inflammation 05/29/2015   Atrial fibrillation ([place]) 12/19/2014   Hyperlipidemia 12/19/2014   Hypothyroidism 12/19/2014   Hypertension 12/19/2014   History of cardiac catheterization 02/17/2013   Congestive cardiomyopathy ([place]) 02/17/2013   Congestive heart failure, severe ([place]) 10/04/2012    Conditions to be addressed/monitored:CHF, HTN, and HLD  Care Plan : Heart Failure (Adult)  Updates made by Neldon Labella, RN since 05/10/2021 12:00 AM     Problem: Symptom Exacerbation (Heart Failure)      Long-Range Goal: Symptom Exacerbation Prevented or Minimized   Start Date: 02/11/2021  Expected End Date: 06/11/2021  Priority: High  Note:   Wt Readings from Last 3 Encounters:  04/03/20 251 lb (113.9 kg)  02/20/20 260 lb (117.9 kg)  02/13/20 260 lb (117.9 kg)     Current Barriers:  Chronic Disease Management support and educational needs related to Congestive Heart Failure.  Case Manager Clinical Goal(s):  Over the next 120 days, patient will not require hospitalization due to complications related to CHF exacerbation.  Interventions:  Collaboration with  Chrismon, Vickki Muff, PA-C regarding development and update of comprehensive plan of care as evidenced by provider attestation and co-signature Inter-disciplinary care team collaboration (see longitudinal plan of care) Reviewed medications and importance of compliance. Reports taking medications as advised. Requested assistance with obtaining needed refills. Will contact [employer]. Reviewed plan for CHF self-management. Reviewed established weight parameters. Currently weighing weekly. Reports weights have been within the established range. Weight this week was 247 lbs.  Discussed importance of monitoring for symptoms of fluid retention. Continues to experience dyspnea with exertion. Denies symptoms at rest. Denies increased lower extremity or abdominal edema. He does not use inhalers or require supplemental oxygen. Reports maintaining oxygen saturations between 97% to 100% room air. Reviewed nutritional intake. Advised to continue monitoring sodium intake attempting to adhere to low sodium/cardiac prudent diet. Reviewed s/sx of CHF exacerbation and indications for seeking immediate medical attention.   Self-Care/Patient Goals: Self administer medications as prescribed Attend all scheduled provider appointments Monitor weight and maintain a log Adhere to a low sodium/cardiac diet Call provider office for new questions and concerns if needed    Follow Up Plan:  Will follow up next month    Care Plan : Hypertension and Hyperlipidemia  Updates made by Neldon Labella, RN since 05/10/2021 12:00 AM     Problem: Hypertension and Hyperlipidemia      Long-Range Goal: Hypertension and Hyperlipidemia Monitored   Start Date: 02/11/2021  Expected End Date: 06/11/2021  Priority: Medium  Note:     Current Barriers:  Chronic Disease Management support and educational needs related to Hypertension and Hyperlipidemia.  Case Manager Clinical Goal(s):  Over the next 120 days, patient will  demonstrate adherence to prescribed treatment plan as evidenced by taking all medications as prescribed, monitoring and recording blood pressure and adhering to low sodium/DASH diet.  Interventions:  Collaboration with Chrismon, Vickki Muff, PA-C regarding development and update of comprehensive plan of care as evidenced by provider attestation and co-signature Inter-disciplinary care team collaboration (see longitudinal plan of care) Reviewed medications and compliance with treatment plan. Reviewed established blood pressure parameters along with indications for notifying a provider. Reports compliance with treatment plan. Reports most blood pressure readings have been within range with some low readings. Denies episodes of dizziness or lightheadedness. He is aware of indications for contacting the clinic. Encouraged to continue adhering to a low sodium diet. Attempting to maintain the recommended diet but reports meal consumption has changed d/t episodes of GI upset. He was evaluated in the clinic. Reports symptoms were related to diverticulitis.  Currently consuming soft foods as advised. He is aware of need to follow up if symptoms worsen. Reviewed s/sx of heart attack, stroke and worsening symptoms that require immediate medical attention.   Patient Goals/Self-Care Activities: Self-administer medications as prescribed Attend medical appointments as scheduled Monitor and record blood pressure Adhere to recommended cardiac prudent/heart healthy diet Notify provider or care management team with questions and new concerns as needed   Follow Up Plan:  Will follow up within the next month         PLAN A member of the care management team will follow up next month.   [name] [facility] Care  Management [facility] [phone]

## 2021-05-15 DIAGNOSIS — I10 Essential (primary) hypertension: Secondary | ICD-10-CM | POA: Diagnosis not present

## 2021-05-15 DIAGNOSIS — I480 Paroxysmal atrial fibrillation: Secondary | ICD-10-CM | POA: Diagnosis not present

## 2021-05-15 DIAGNOSIS — I5022 Chronic systolic (congestive) heart failure: Secondary | ICD-10-CM | POA: Diagnosis not present

## 2021-05-15 DIAGNOSIS — Z79899 Other long term (current) drug therapy: Secondary | ICD-10-CM | POA: Diagnosis not present

## 2021-05-15 DIAGNOSIS — E785 Hyperlipidemia, unspecified: Secondary | ICD-10-CM | POA: Diagnosis not present

## 2021-05-15 DIAGNOSIS — Z23 Encounter for immunization: Secondary | ICD-10-CM | POA: Diagnosis not present

## 2021-05-15 DIAGNOSIS — Z5181 Encounter for therapeutic drug level monitoring: Secondary | ICD-10-CM | POA: Diagnosis not present

## 2021-05-15 DIAGNOSIS — I420 Dilated cardiomyopathy: Secondary | ICD-10-CM | POA: Diagnosis not present

## 2021-05-29 DIAGNOSIS — I5040 Unspecified combined systolic (congestive) and diastolic (congestive) heart failure: Secondary | ICD-10-CM

## 2021-05-29 DIAGNOSIS — E785 Hyperlipidemia, unspecified: Secondary | ICD-10-CM

## 2021-05-29 DIAGNOSIS — I110 Hypertensive heart disease with heart failure: Secondary | ICD-10-CM

## 2021-05-29 DIAGNOSIS — Z87891 Personal history of nicotine dependence: Secondary | ICD-10-CM

## 2021-06-12 ENCOUNTER — Encounter: Payer: Self-pay | Admitting: Family Medicine

## 2021-06-12 ENCOUNTER — Other Ambulatory Visit: Payer: Self-pay

## 2021-06-12 ENCOUNTER — Ambulatory Visit (INDEPENDENT_AMBULATORY_CARE_PROVIDER_SITE_OTHER): Payer: PPO | Admitting: Family Medicine

## 2021-06-12 ENCOUNTER — Ambulatory Visit: Payer: Self-pay

## 2021-06-12 VITALS — BP 141/56 | HR 54 | Resp 15 | Wt 239.2 lb

## 2021-06-12 DIAGNOSIS — Z9989 Dependence on other enabling machines and devices: Secondary | ICD-10-CM

## 2021-06-12 DIAGNOSIS — Z5320 Procedure and treatment not carried out because of patient's decision for unspecified reasons: Secondary | ICD-10-CM

## 2021-06-12 DIAGNOSIS — I4811 Longstanding persistent atrial fibrillation: Secondary | ICD-10-CM | POA: Insufficient documentation

## 2021-06-12 DIAGNOSIS — N1832 Chronic kidney disease, stage 3b: Secondary | ICD-10-CM | POA: Diagnosis not present

## 2021-06-12 DIAGNOSIS — Z125 Encounter for screening for malignant neoplasm of prostate: Secondary | ICD-10-CM | POA: Insufficient documentation

## 2021-06-12 DIAGNOSIS — Z0000 Encounter for general adult medical examination without abnormal findings: Secondary | ICD-10-CM | POA: Insufficient documentation

## 2021-06-12 DIAGNOSIS — Z136 Encounter for screening for cardiovascular disorders: Secondary | ICD-10-CM | POA: Diagnosis not present

## 2021-06-12 DIAGNOSIS — I5040 Unspecified combined systolic (congestive) and diastolic (congestive) heart failure: Secondary | ICD-10-CM | POA: Insufficient documentation

## 2021-06-12 DIAGNOSIS — R7989 Other specified abnormal findings of blood chemistry: Secondary | ICD-10-CM | POA: Insufficient documentation

## 2021-06-12 DIAGNOSIS — E039 Hypothyroidism, unspecified: Secondary | ICD-10-CM

## 2021-06-12 DIAGNOSIS — R7303 Prediabetes: Secondary | ICD-10-CM

## 2021-06-12 DIAGNOSIS — E782 Mixed hyperlipidemia: Secondary | ICD-10-CM

## 2021-06-12 DIAGNOSIS — I10 Essential (primary) hypertension: Secondary | ICD-10-CM | POA: Diagnosis not present

## 2021-06-12 DIAGNOSIS — I480 Paroxysmal atrial fibrillation: Secondary | ICD-10-CM

## 2021-06-12 DIAGNOSIS — N184 Chronic kidney disease, stage 4 (severe): Secondary | ICD-10-CM | POA: Insufficient documentation

## 2021-06-12 DIAGNOSIS — Z13220 Encounter for screening for lipoid disorders: Secondary | ICD-10-CM | POA: Diagnosis not present

## 2021-06-12 MED ORDER — LEVOTHYROXINE SODIUM 100 MCG PO TABS: ORAL_TABLET | ORAL | 3 refills | Status: DC

## 2021-06-12 MED ORDER — AMIODARONE HCL 200 MG PO TABS: ORAL_TABLET | ORAL | 3 refills | Status: DC

## 2021-06-12 MED ORDER — METOPROLOL SUCCINATE ER 25 MG PO TB24: ORAL_TABLET | ORAL | 3 refills | Status: DC

## 2021-06-12 MED ORDER — XARELTO 20 MG PO TABS: 20.0000 mg | ORAL_TABLET | Freq: Every day | ORAL | 3 refills | Status: DC

## 2021-06-12 MED ORDER — LOVASTATIN 20 MG PO TABS: 20.0000 mg | ORAL_TABLET | Freq: Every day | ORAL | 3 refills | Status: DC

## 2021-06-12 MED ORDER — HYDROCHLOROTHIAZIDE 12.5 MG PO CAPS: 12.5000 mg | ORAL_CAPSULE | Freq: Every day | ORAL | 3 refills | Status: DC

## 2021-06-12 NOTE — Assessment & Plan Note (Signed)
Slight elevation; however, labile in the past- continue to allow some laxity in range given age

## 2021-06-12 NOTE — Assessment & Plan Note (Signed)
Repeat A1c; slight gradual weight loss noted, however, BMI remains >30 today

## 2021-06-12 NOTE — Assessment & Plan Note (Signed)
DRE deferred; PSA ordered Denies complaints of BPH related s/s aka LUTS

## 2021-06-12 NOTE — Assessment & Plan Note (Signed)
Plans for eye exam soon d/t use of amio Things to do to keep yourself healthy  - Exercise at least 30-45 minutes a day, 3-4 days a week.  - Eat a low-fat diet with lots of fruits and vegetables, up to 7-9 servings per day.  - Seatbelts can save your life. Wear them always.  - Smoke detectors on every level of your home, check batteries every year.  - Eye Doctor - have an eye exam every 1-2 years  - Safe sex - if you may be exposed to STDs, use a condom.  - Alcohol -  If you drink, do it moderately, less than 2 drinks per day.  - [place]. Choose someone to speak for you if you are not able.  - Depression is common in our stressful world.If you're feeling down or losing interest in things you normally enjoy, please come in for a visit.  - Violence - If anyone is threatening or hurting you, please call immediately.

## 2021-06-12 NOTE — Assessment & Plan Note (Signed)
Chronic use, stable Denies falls More of a walking stick Higher height, wide-quad base

## 2021-06-12 NOTE — Assessment & Plan Note (Signed)
Denies complaints outside of temperature regulation concerns d/t blood thinner; Afib remains stable on xarelto

## 2021-06-12 NOTE — Progress Notes (Signed)
Annual Wellness Visit     Patient: [name] Poffenberger., Male    DOB: 03-19-1941, 80 y.o.   MRN: [id number] Visit Date: 06/12/2021  Today's Provider: Gwyneth Sprout, FNP   No chief complaint on file.  Subjective      HPI [name] ROLPH Sr. is a 80 y.o. male who presents today for his Annual Wellness Visit. He reports consuming a general diet. The patient does not participate in regular exercise at present. He generally feels well. He reports sleeping fairly well. He does not have additional problems to discuss today.   Last Reported Colonoscopy-06/20/2009  Medications: Outpatient Medications Prior to Visit  Medication Sig   furosemide (LASIX) 20 MG tablet TAKE 1 TABLET(20 MG) BY MOUTH DAILY AS NEEDED FOR FLUID RETENTION   lisinopril (ZESTRIL) 10 MG tablet Take 1 tablet (10 mg total) by mouth daily.   [DISCONTINUED] amiodarone (PACERONE) 200 MG tablet 200 mg.    [DISCONTINUED] hydrochlorothiazide (MICROZIDE) 12.5 MG capsule Take 1 capsule (12.5 mg total) by mouth daily.   [DISCONTINUED] levothyroxine (SYNTHROID) 100 MCG tablet TAKE 1 TABLET(100 MCG) BY MOUTH DAILY BEFORE BREAKFAST   [DISCONTINUED] lovastatin (MEVACOR) 20 MG tablet TAKE 1 TABLET BY MOUTH EVERY DAY   [DISCONTINUED] metoprolol succinate (TOPROL-XL) 25 MG 24 hr tablet 1 TABLET BY MOUTH ONCE A DAY   [DISCONTINUED] XARELTO 20 MG TABS tablet TAKE 1 TABLET BY MOUTH EVERY DAY   [DISCONTINUED] amoxicillin-clavulanate (AUGMENTIN) 875-125 MG tablet Take 1 tablet by mouth 2 (two) times daily.   [DISCONTINUED] gabapentin (NEURONTIN) 300 MG capsule Take 1 capsule (300 mg total) by mouth 2 (two) times daily.   [DISCONTINUED] potassium chloride (KLOR-CON) 10 MEQ tablet Take 10 mEq by mouth daily.   No facility-administered medications prior to visit.    No Known Allergies  Patient Care Team: Gwyneth Sprout, FNP as PCP - General (Family Medicine) Isaias Cowman, MD as Consulting Physician (Cardiology) Gillis Santa, MD as Consulting Physician (Pain Medicine) Neldon Labella, RN as Case Manager  Review of Systems  All other systems reviewed and are negative.      Objective    Vitals: BP (!) 141/56    Pulse (!) 54    Resp 15    Wt 239 lb 3.2 oz (108.5 kg)    SpO2 100%    BMI 30.71 kg/m    Physical Exam Vitals and nursing note reviewed.  Constitutional:      General: He is awake. He is not in acute distress.    Appearance: Normal appearance. He is well-developed and well-groomed. He is obese. He is not ill-appearing, toxic-appearing or diaphoretic.  HENT:     Head: Normocephalic and atraumatic.     Jaw: There is normal jaw occlusion. No trismus, tenderness, swelling or pain on movement.     Salivary Glands: Right salivary gland is not diffusely enlarged or tender. Left salivary gland is not diffusely enlarged or tender.     Right Ear: Hearing, tympanic membrane, ear canal and external ear normal. There is no impacted cerumen.     Left Ear: Hearing, tympanic membrane, ear canal and external ear normal. There is no impacted cerumen.     Nose: Nose normal. No congestion or rhinorrhea.     Right Turbinates: Not enlarged, swollen or pale.     Left Turbinates: Not enlarged, swollen or pale.     Right Sinus: No maxillary sinus tenderness or frontal sinus tenderness.     Left Sinus: No maxillary  sinus tenderness or frontal sinus tenderness.     Mouth/Throat:     Lips: Pink.     Mouth: Mucous membranes are moist. No injury, lacerations, oral lesions or angioedema.     Pharynx: Oropharynx is clear. Uvula midline. No pharyngeal swelling, oropharyngeal exudate or posterior oropharyngeal erythema.     Tonsils: No tonsillar exudate or tonsillar abscesses.  Eyes:     General: Lids are normal. Vision grossly intact. Gaze aligned appropriately.        Right eye: No discharge.        Left eye: No discharge.     Extraocular Movements: Extraocular movements intact.     Conjunctiva/sclera: Conjunctivae  normal.     Pupils: Pupils are equal, round, and reactive to light.  Neck:     Thyroid: No thyroid mass, thyromegaly or thyroid tenderness.     Vascular: No carotid bruit.     Trachea: Trachea normal. No tracheal tenderness.  Cardiovascular:     Rate and Rhythm: Normal rate and regular rhythm.     Pulses: Normal pulses.          Carotid pulses are 2+ on the right side and 2+ on the left side.      Radial pulses are 2+ on the right side and 2+ on the left side.       Femoral pulses are 2+ on the right side and 2+ on the left side.      Popliteal pulses are 2+ on the right side and 2+ on the left side.       Dorsalis pedis pulses are 2+ on the right side and 2+ on the left side.       Posterior tibial pulses are 2+ on the right side and 2+ on the left side.     Heart sounds: Normal heart sounds, S1 normal and S2 normal. No murmur heard.   No friction rub. No gallop.  Pulmonary:     Effort: Pulmonary effort is normal. No respiratory distress.     Breath sounds: Normal breath sounds and air entry. No stridor. No wheezing, rhonchi or rales.  Chest:     Chest wall: No tenderness.  Abdominal:     General: Abdomen is flat. Bowel sounds are normal. There is no distension.     Palpations: Abdomen is soft. There is no mass.     Tenderness: There is no abdominal tenderness. There is no guarding or rebound.     Hernia: No hernia is present.  Genitourinary:    Comments: Exam deferred; denies complaints Musculoskeletal:        General: No swelling, tenderness, deformity or signs of injury. Normal range of motion.     Cervical back: Normal range of motion and neck supple. No rigidity or tenderness.     Right lower leg: No edema.     Left lower leg: No edema.  Lymphadenopathy:     Cervical: No cervical adenopathy.     Right cervical: No superficial, deep or posterior cervical adenopathy.    Left cervical: No superficial, deep or posterior cervical adenopathy.  Skin:    General: Skin is warm and  dry.     Capillary Refill: Capillary refill takes less than 2 seconds.     Coloration: Skin is not jaundiced or pale.     Findings: No bruising, erythema, lesion or rash.  Neurological:     General: No focal deficit present.     Mental Status: He is alert and oriented to  person, place, and time. Mental status is at baseline.     GCS: GCS eye subscore is 4. GCS verbal subscore is 5. GCS motor subscore is 6.     Sensory: Sensation is intact. No sensory deficit.     Motor: Motor function is intact. No weakness.     Coordination: Coordination is intact.     Gait: Gait is intact.  Psychiatric:        Attention and Perception: Attention and perception normal.        Mood and Affect: Mood and affect normal.        Speech: Speech normal.        Behavior: Behavior normal. Behavior is cooperative.        Thought Content: Thought content normal.        Cognition and Memory: Cognition normal.        Judgment: Judgment normal.    Most recent functional status assessment: In your present state of health, do you have any difficulty performing the following activities: 06/12/2021  Hearing? N  Vision? N  Difficulty concentrating or making decisions? N  Walking or climbing stairs? Y  Dressing or bathing? N  Doing errands, shopping? N  Some recent data might be hidden   Most recent fall risk assessment: Fall Risk  03/07/2021  Falls in the past year? 0  Number falls in past yr: 0  Injury with Fall? 0  Risk for fall due to : No Fall Risks  Follow up Falls evaluation completed    Most recent depression screenings: PHQ 2/9 Scores 06/12/2021 03/07/2021  PHQ - 2 Score 0 0  PHQ- 9 Score 0 0   Most recent cognitive screening: No flowsheet data found. Most recent Audit-C alcohol use screening Alcohol Use Disorder Test (AUDIT) 06/12/2021  1. How often do you have a drink containing alcohol? 0  2. How many drinks containing alcohol do you have on a typical day when you are drinking? 0  3. How often  do you have six or more drinks on one occasion? 0  AUDIT-C Score 0  Alcohol Brief Interventions/Follow-up -   A score of 3 or more in women, and 4 or more in men indicates increased risk for alcohol abuse, EXCEPT if all of the points are from question 1   No results found for any visits on 06/12/21.  Assessment & Plan     Annual wellness visit done today including the all of the following: Reviewed patient's Family Medical History Reviewed and updated list of patient's medical providers Assessment of cognitive impairment was done Assessed patient's functional ability Established a written schedule for health screening [place] Completed and Reviewed  Exercise Activities and Dietary recommendations  Goals      DIET - REDUCE SUGAR INTAKE     Recommend to decrease or completely cut out all extra sugars in diet.         Immunization History  Administered Date(s) Administered   Fluad Quad(high Dose 65+) 04/03/2020   Influenza Split 03/22/2012   Influenza, High Dose Seasonal PF 05/23/2014, 06/04/2015, 03/18/2017, 03/17/2018   Pneumococcal Conjugate-13 05/23/2014   Pneumococcal Polysaccharide-23 06/04/2015   Tdap 04/21/2014    Health Maintenance  Topic Date Due   COVID-19 Vaccine (1) Never done   Zoster Vaccines- Shingrix (1 of 2) Never done   INFLUENZA VACCINE  09/28/2021 (Originally 01/28/2021)   TETANUS/TDAP  04/21/2024   Pneumonia Vaccine 71+ Years old  Completed   HPV VACCINES  Aged  Out     Discussed health benefits of physical activity, and encouraged him to engage in regular exercise appropriate for his age and condition.    Problem List Items Addressed This Visit       Cardiovascular and Mediastinum   Hypertension    Slight elevation; however, labile in the past- continue to allow some laxity in range given age       Relevant Medications   amiodarone (PACERONE) 200 MG tablet   hydrochlorothiazide (MICROZIDE) 12.5 MG capsule    lovastatin (MEVACOR) 20 MG tablet   metoprolol succinate (TOPROL-XL) 25 MG 24 hr tablet   XARELTO 20 MG TABS tablet   Longstanding persistent atrial fibrillation (HCC)    Denies complaints outside of temperature regulation concerns d/t blood thinner; Afib remains stable on xarelto       Relevant Medications   amiodarone (PACERONE) 200 MG tablet   hydrochlorothiazide (MICROZIDE) 12.5 MG capsule   lovastatin (MEVACOR) 20 MG tablet   metoprolol succinate (TOPROL-XL) 25 MG 24 hr tablet   XARELTO 20 MG TABS tablet   CHF (congestive heart failure), NYHA class II, unspecified failure chronicity, combined ([place])    Every 6 month f/u with cards; was seen in November They did chem and TSH- hold off today      Relevant Medications   amiodarone (PACERONE) 200 MG tablet   hydrochlorothiazide (MICROZIDE) 12.5 MG capsule   lovastatin (MEVACOR) 20 MG tablet   metoprolol succinate (TOPROL-XL) 25 MG 24 hr tablet   XARELTO 20 MG TABS tablet     Endocrine   Hypothyroidism    Labs done at [facility] in 11/22; hold off today      Relevant Medications   levothyroxine (SYNTHROID) 100 MCG tablet   metoprolol succinate (TOPROL-XL) 25 MG 24 hr tablet     Genitourinary   Stage 3b chronic kidney disease (HCC)    Insight provided on stages of kidney filtration and importance of hydration and fluid balance with heart failure Recommend referral to nephro- pt stated he has never been previously Advised knowing his goals for potential for kidney failure if it got to that point if he would want to be on PD or iHD;       Relevant Orders   AMB Referral to [place]     Other   Pre-diabetes    Repeat A1c; slight gradual weight loss noted, however, BMI remains >30 today      Relevant Orders   Hemoglobin A1c   Annual physical exam - Primary    Plans for eye exam soon d/t use of amio Things to do to keep yourself healthy  - Exercise at least 30-45 minutes a day, 3-4 days a week.  - Eat a  low-fat diet with lots of fruits and vegetables, up to 7-9 servings per day.  - Seatbelts can save your life. Wear them always.  - Smoke detectors on every level of your home, check batteries every year.  - Eye Doctor - have an eye exam every 1-2 years  - Safe sex - if you may be exposed to STDs, use a condom.  - Alcohol -  If you drink, do it moderately, less than 2 drinks per day.  - [place]. Choose someone to speak for you if you are not able.  - Depression is common in our stressful world.If you're feeling down or losing interest in things you normally enjoy, please come in for a visit.  - Violence -  If anyone is threatening or hurting you, please call immediately.        Elevated serum creatinine    Repeat chemistry amb ref to nephro- does not want to be placed on iHD      Encounter for lipid screening for cardiovascular disease    Repeat Lipid panel for CV screening given known HTN and Afib as well as pre-diabetes      Relevant Orders   Lipid panel   Screening for prostate cancer    DRE deferred; PSA ordered Denies complaints of BPH related s/s aka LUTS      Relevant Orders   PSA Total (Reflex To [facility])   Colonoscopy refused    Pt stated HR issues noted during screening colonoscopy Doesn't trust colo guard Education provided; denies stool complaints outside of occ. constipation where patient will eat additional cereal or fruit to assist bowel to move      Use of cane as ambulatory aid    Chronic use, stable Denies falls More of a walking stick Higher height, wide-quad base      Other Visit Diagnoses     Hyperlipidemia       Relevant Medications   amiodarone (PACERONE) 200 MG tablet   hydrochlorothiazide (MICROZIDE) 12.5 MG capsule   lovastatin (MEVACOR) 20 MG tablet   metoprolol succinate (TOPROL-XL) 25 MG 24 hr tablet   XARELTO 20 MG TABS tablet        Return in about 6 months (around 12/11/2021) for chonic disease management.      [name] Kotyk, FNP, have reviewed all documentation for this visit. The documentation on 06/12/21 for the exam, diagnosis, procedures, and orders are all accurate and complete.    Gwyneth Sprout, [place] [phone] (phone) [phone] (fax)  [place]

## 2021-06-12 NOTE — Assessment & Plan Note (Signed)
Pt stated HR issues noted during screening colonoscopy Doesn't trust colo guard Education provided; denies stool complaints outside of occ. constipation where patient will eat additional cereal or fruit to assist bowel to move

## 2021-06-12 NOTE — Assessment & Plan Note (Signed)
Insight provided on stages of kidney filtration and importance of hydration and fluid balance with heart failure Recommend referral to nephro- pt stated he has never been previously Advised knowing his goals for potential for kidney failure if it got to that point if he would want to be on PD or iHD;

## 2021-06-12 NOTE — Assessment & Plan Note (Signed)
Labs done at cards in 11/22; hold off today

## 2021-06-12 NOTE — Assessment & Plan Note (Signed)
Every 6 month f/u with cards; was seen in November They did chem and TSH- hold off today

## 2021-06-12 NOTE — Assessment & Plan Note (Signed)
Repeat Lipid panel for CV screening given known HTN and Afib as well as pre-diabetes

## 2021-06-12 NOTE — Telephone Encounter (Signed)
Pharmacy called in needing directions on this medication amiodarone (PACERONE) 200 MG tablet Rx has no instructions as how to take. Please advise   Pharmacy requesting instructions for Amiodarone. Please advise.  Answer Assessment - Initial Assessment Questions 1. NAME of MEDICATION: "What medicine are you calling about?"     Amiodarone 2. QUESTION: "What is your question?" (e.g., double dose of medicine, side effect)     Directions 3. PRESCRIBING HCP: "Who prescribed it?" Reason: if prescribed by specialist, call should be referred to that group.     [place] 4. SYMPTOMS: "Do you have any symptoms?"     N/a 5. SEVERITY: If symptoms are present, ask "Are they mild, moderate or severe?"     N/a 6. PREGNANCY:  "Is there any chance that you are pregnant?" "When was your last menstrual period?"     N/a  Protocols used: Medication Question Call-A-AH

## 2021-06-12 NOTE — Assessment & Plan Note (Signed)
Repeat chemistry amb ref to nephro- does not want to be placed on iHD

## 2021-06-13 LAB — LIPID PANEL
Chol/HDL Ratio: 3.7 ratio (ref 0.0–5.0)
Cholesterol, Total: 149 mg/dL (ref 100–199)
HDL: 40 mg/dL (ref >39)
LDL Chol Calc (NIH): 85 mg/dL (ref 0–99)
Triglycerides: 135 mg/dL (ref 0–149)
VLDL Cholesterol Cal: 24 mg/dL (ref 5–40)

## 2021-06-13 LAB — PSA TOTAL (REFLEX TO FREE): Prostate Specific Ag, Serum: 1.7 ng/mL (ref 0.0–4.0)

## 2021-06-13 LAB — HEMOGLOBIN A1C
Est. average glucose Bld gHb Est-mCnc: 117 mg/dL
Hgb A1c MFr Bld: 5.7 % — ABNORMAL HIGH (ref 4.8–5.6)

## 2021-06-14 ENCOUNTER — Other Ambulatory Visit: Payer: Self-pay | Admitting: Family Medicine

## 2021-06-14 MED ORDER — AMIODARONE HCL 200 MG PO TABS: 200.0000 mg | ORAL_TABLET | Freq: Every day | ORAL | 3 refills | Status: DC

## 2021-06-14 NOTE — Progress Notes (Signed)
It was a pleasure to see you in the office the other day.  Continue statin to assist with LDL reduction; could work on diet/exercise as tolerated to assist with HDL. Recommend increase in diet of healthier fat choices- low fat meats, oils that are not solid at room temperature, nuts, seeds, fish- cod, halibut, salmon, and avocado. Exercise can also increase this number.  Supplemental omega 3's can be taken as well but are not as helpful as dietary/exercise changes.   Cholesterol is stable; your current risk of heart attack/stroke is 38%.   The 10-year ASCVD risk score (Arnett DK, et al., 2019) is: 38.2%   Values used to calculate the score:     Age: 80 years     Sex: Male     Is Non-Hispanic African American: No     Diabetic: No     Tobacco smoker: No     Systolic Blood Pressure: 007 mmHg     Is BP treated: Yes     HDL Cholesterol: 40 mg/dL     Total Cholesterol: 149 mg/dL  PSA, screening for prostate cancer is stable.  Your blood sugar average is back in pre-diabetes range. Continue to recommend balanced, lower carb meals. Smaller meal size, adding snacks. Choosing water as drink of choice and increasing purposeful exercise.  Please let us know if you have any questions.  Thank you,  [name], FNP

## 2021-06-18 ENCOUNTER — Telehealth: Payer: Self-pay

## 2021-06-18 ENCOUNTER — Telehealth: Payer: PPO

## 2021-06-18 ENCOUNTER — Ambulatory Visit (INDEPENDENT_AMBULATORY_CARE_PROVIDER_SITE_OTHER): Payer: PPO

## 2021-06-18 DIAGNOSIS — E785 Hyperlipidemia, unspecified: Secondary | ICD-10-CM

## 2021-06-18 DIAGNOSIS — I10 Essential (primary) hypertension: Secondary | ICD-10-CM

## 2021-06-18 DIAGNOSIS — I480 Paroxysmal atrial fibrillation: Secondary | ICD-10-CM

## 2021-06-18 DIAGNOSIS — I5040 Unspecified combined systolic (congestive) and diastolic (congestive) heart failure: Secondary | ICD-10-CM

## 2021-06-18 NOTE — Telephone Encounter (Signed)
°  Care Management   Follow Up Note   06/18/2021 Name: [name] TEJADA Sr. MRN: [id number] DOB: 21-May-1941   Primary Care Provider: Gwyneth Sprout, FNP Reason for referral : Chronic Care Management   An unsuccessful telephone outreach was attempted today. The patient was referred to the case management team for assistance with care management and care coordination.    Follow Up Plan:  [name]'s phone rang multiple times without the option to leave a voice message. A member of the care management team will follow-up within the next two weeks.   [name] Care Management [facility] [phone]

## 2021-06-20 NOTE — Chronic Care Management (AMB) (Signed)
Chronic Care Management   CCM RN Visit Note   Name: [name] DANNER Sr. MRN: [id number] DOB: Aug 20, 1940  Subjective: [name] Friends Sr. is a 80 y.o. year old male who is a primary care patient of Gwyneth Sprout, FNP. The care management team was consulted for assistance with disease management and care coordination needs.    Engaged with patient by telephone for follow up visit in response to provider referral for case management and care coordination services.   Consent to Services:  The patient was given information about Chronic Care Management services, agreed to services, and gave verbal consent prior to initiation of services.  Please see initial visit note for detailed documentation.    Assessment: Review of patient past medical history, allergies, medications, health status, including review of consultants reports, laboratory and other test data, was performed as part of comprehensive evaluation and provision of chronic care management services.   SDOH (Social Determinants of Health) assessments and interventions performed:  No  CCM Care Plan  No Known Allergies  Outpatient Encounter Medications as of 06/18/2021  Medication Sig   amiodarone (PACERONE) 200 MG tablet Take 1 tablet (200 mg total) by mouth daily.   furosemide (LASIX) 20 MG tablet TAKE 1 TABLET(20 MG) BY MOUTH DAILY AS NEEDED FOR FLUID RETENTION   hydrochlorothiazide (MICROZIDE) 12.5 MG capsule Take 1 capsule (12.5 mg total) by mouth daily.   levothyroxine (SYNTHROID) 100 MCG tablet TAKE 1 TABLET(100 MCG) BY MOUTH DAILY BEFORE BREAKFAST   lisinopril (ZESTRIL) 10 MG tablet Take 1 tablet (10 mg total) by mouth daily.   lovastatin (MEVACOR) 20 MG tablet Take 1 tablet (20 mg total) by mouth daily.   metoprolol succinate (TOPROL-XL) 25 MG 24 hr tablet 1 TABLET BY MOUTH ONCE A DAY   XARELTO 20 MG TABS tablet Take 1 tablet (20 mg total) by mouth daily.   No facility-administered encounter medications on file as of  06/18/2021.    Patient Active Problem List   Diagnosis Date Noted   Annual physical exam 06/12/2021   Elevated serum creatinine 06/12/2021   Stage 3b chronic kidney disease ([place]) 06/12/2021   Encounter for lipid screening for cardiovascular disease 06/12/2021   Screening for prostate cancer 06/12/2021   Longstanding persistent atrial fibrillation ([place]) 06/12/2021   Colonoscopy refused 06/12/2021   CHF (congestive heart failure), NYHA class II, unspecified failure chronicity, combined ([place]) 06/12/2021   Use of cane as ambulatory aid 06/12/2021   Pre-diabetes 03/18/2017   Hypothyroidism 12/19/2014   Hypertension 12/19/2014    Patient Care Plan: RN Care Management Plan of Care     Problem Identified: A-Fib, CHF, HLD, HTN      Long-Range Goal: Disease Progression Prevented or Minimized   Start Date: 06/18/2021  Expected End Date: 09/16/2021  Priority: High  Note:   Current Barriers:  Chronic Disease Management support and education needs related to Atrial Fibrillation, CHF, HTN, and HLD   RNCM Clinical Goal(s):  Patient will demonstrate Ongoing adherence to prescribed treatment plan for Atrial Fibrillation, CHF, HTN, and HLD through collaboration with the provider RN Care manager and the care team.   Interventions: 1:1 collaboration with primary care provider regarding development and update of comprehensive plan of care as evidenced by provider attestation and co-signature Inter-disciplinary care team collaboration (see longitudinal plan of care) Evaluation of current treatment plan related to  self management and patient's adherence to plan as established by provider   AFIB Interventions: (Status:  Goal on track:  Yes.) Long  Term Goal Reviewed Afib action plan Discussed increased risk of stroke due to Afib and benefits of anticoagulation for stroke prevention Discussed importance of adherence to anticoagulant as prescribed Discussed need to avoid NSAIDs due to increased  bleeding risk with anticoagulants Discussed importance of completing regular laboratory monitoring as prescribed Discussed concerns regarding cost of Xarelto. Agreeable to discussing options with the CCM Pharmacist. Will forward message to the [place] team.   Heart Failure Interventions:  (Status:  Goal on track:  Yes.) Long Term Goal Wt Readings from Last 3 Encounters:  06/12/21 239 lb 3.2 oz (108.5 kg)  03/07/21 247 lb (112 kg)  04/03/20 251 lb (113.9 kg)    Reviewed medications and plan for CHF management. Reports excellent compliance. Reviewed established weight parameters. Continues to weigh weekly. Reports weights have remained stable. Reports weight this week was 246 lbs. Reviewed importance of monitoring for symptoms of fluid retention. Denies increased edema. Denies changes or decline in activity tolerance. Continues to experience dyspnea with exertion. Denies symptoms at rest.  Reports oxygen saturations continue to range in the high 90's to 100% room air.  Reviewed nutritional intake. Advised to continue monitoring sodium intake and attempt to adhere to a low sodium/cardiac prudent diet. Reviewed s/sx of CHF exacerbation and indications for seeking immediate medical attention.   Hyperlipidemia Interventions:  (Status:  Goal on track:  Yes.) Long Term Goal Lab Results  Component Value Date   CHOL 149 06/12/2021   HDL 40 06/12/2021   LDLCALC 85 06/12/2021   TRIG 135 06/12/2021   CHOLHDL 3.7 06/12/2021    Medications reviewed Reviewed provider established cholesterol goals Discussed importance of regular laboratory monitoring as prescribed Discussed strategies to manage statin-induced myalgias Reviewed importance of limiting foods high in cholesterol  Hypertension Interventions:   Last practice recorded BP readings:  BP Readings from Last 3 Encounters:  06/12/21 (!) 141/56  03/07/21 (!) 124/47  04/03/20 107/63  Most recent eGFR/CrCl: No results found for: EGFR  No  components found for: CRCL  Reviewed medications. Reports taking as prescribed.  Reviewed established blood pressure parameters. Reports monitoring as advised. Reports home readings have been on the lower end of normal. Reports systolic readings have been normal but diastolic readings have ranged in the high 50's to low 60's. Denies symptoms. Advised to continue monitoring and recording readings. Discussed indications for notifying a provider. Reviewed nutritional intake. Advised to continue reading nutrition labels, monitoring sodium intake, and avoiding highly processed foods when possible. Reviewed s/sx of heart attack, stroke and worsening symptoms that require immediate medical attention.  Patient Goals/Self-Care Activities: Take all medications as prescribed Attend all scheduled provider appointments Call pharmacy for medication refills 3-7 days in advance of running out of medications Call provider office for new concerns or questions   Follow Up Plan:   Will follow up within the next month.      PLAN Will follow up within the next month.   [name] Care Management [facility] [phone]

## 2021-06-21 ENCOUNTER — Ambulatory Visit: Payer: Self-pay

## 2021-06-21 DIAGNOSIS — I480 Paroxysmal atrial fibrillation: Secondary | ICD-10-CM

## 2021-06-21 DIAGNOSIS — I5040 Unspecified combined systolic (congestive) and diastolic (congestive) heart failure: Secondary | ICD-10-CM

## 2021-06-21 NOTE — Chronic Care Management (AMB) (Signed)
Chronic Care Management   CCM RN Visit Note  06/21/2021 Name: [name] BOTTOMLEY Sr. MRN: [id number] DOB: [birth year]  Subjective: [name] Friends Sr. is a 80 y.o. year old male who is a primary care patient of Gwyneth Sprout, FNP. The care management team was consulted for assistance with disease management and care coordination needs.    Engaged with patient by telephone for follow up visit in response to provider referral for case management and care coordination services.   Consent to Services:  The patient was given information about Chronic Care Management services, agreed to services, and gave verbal consent prior to initiation of services.  Please see initial visit note for detailed documentation.     Assessment: Review of patient past medical history, allergies, medications, health status, including review of consultants reports, laboratory and other test data, was performed as part of comprehensive evaluation and provision of chronic care management services.   SDOH (Social Determinants of Health) assessments and interventions performed: No  CCM Care Plan  No Known Allergies  Outpatient Encounter Medications as of 06/21/2021  Medication Sig   amiodarone (PACERONE) 200 MG tablet Take 1 tablet (200 mg total) by mouth daily.   furosemide (LASIX) 20 MG tablet TAKE 1 TABLET(20 MG) BY MOUTH DAILY AS NEEDED FOR FLUID RETENTION   hydrochlorothiazide (MICROZIDE) 12.5 MG capsule Take 1 capsule (12.5 mg total) by mouth daily.   levothyroxine (SYNTHROID) 100 MCG tablet TAKE 1 TABLET(100 MCG) BY MOUTH DAILY BEFORE BREAKFAST   lisinopril (ZESTRIL) 10 MG tablet Take 1 tablet (10 mg total) by mouth daily.   lovastatin (MEVACOR) 20 MG tablet Take 1 tablet (20 mg total) by mouth daily.   metoprolol succinate (TOPROL-XL) 25 MG 24 hr tablet 1 TABLET BY MOUTH ONCE A DAY   XARELTO 20 MG TABS tablet Take 1 tablet (20 mg total) by mouth daily.   No facility-administered encounter medications on  file as of 06/21/2021.    Patient Active Problem List   Diagnosis Date Noted   Annual physical exam 06/12/2021   Elevated serum creatinine 06/12/2021   Stage 3b chronic kidney disease ([place]) 06/12/2021   Encounter for lipid screening for cardiovascular disease 06/12/2021   Screening for prostate cancer 06/12/2021   Longstanding persistent atrial fibrillation ([place]) 06/12/2021   Colonoscopy refused 06/12/2021   CHF (congestive heart failure), NYHA class II, unspecified failure chronicity, combined ([place]) 06/12/2021   Use of cane as ambulatory aid 06/12/2021   Pre-diabetes 03/18/2017   Hypothyroidism 12/19/2014   Hypertension 12/19/2014    Patient Care Plan: RN Care Management Plan of Care     Problem Identified: A-Fib, CHF, HLD, HTN      Long-Range Goal: Disease Progression Prevented or Minimized   Start Date: 06/18/2021  Expected End Date: 09/16/2021  Priority: High  Note:   Current Barriers:  Chronic Disease Management support and education needs related to Atrial Fibrillation, CHF, HTN, and HLD   RNCM Clinical Goal(s):  Patient will demonstrate Ongoing adherence to prescribed treatment plan for Atrial Fibrillation, CHF, HTN, and HLD through collaboration with the provider RN Care manager and the care team.   Interventions: 1:1 collaboration with primary care provider regarding development and update of comprehensive plan of care as evidenced by provider attestation and co-signature Inter-disciplinary care team collaboration (see longitudinal plan of care) Evaluation of current treatment plan related to self management and patient's adherence to plan as established by provider   AFIB Interventions: (Status:  Goal on track:  Yes.) Long Term  Goal Reviewed Afib action plan Discussed increased risk of stroke due to Afib and benefits of anticoagulation for stroke prevention Discussed importance of adherence to anticoagulant as prescribed Discussed need to avoid NSAIDs due to  increased bleeding risk with anticoagulants Discussed importance of completing regular laboratory monitoring as prescribed   Heart Failure Interventions:  (Status:  Goal on track:  Yes.) Long Term Goal Wt Readings from Last 3 Encounters:  06/12/21 239 lb 3.2 oz (108.5 kg)  03/07/21 247 lb (112 kg)  04/03/20 251 lb (113.9 kg)    Reviewed medications and plan for CHF management. Reports excellent compliance. Reviewed established weight parameters. Continues to weigh weekly. Reports weights have remained stable. Reports weight this week was 246 lbs. Reviewed importance of monitoring for symptoms of fluid retention. Denies increased edema. Denies changes or decline in activity tolerance. Continues to experience dyspnea with exertion. Denies symptoms at rest.  Reports oxygen saturations continue to range in the high 90's to 100% room air.  Reviewed nutritional intake. Advised to continue monitoring sodium intake and attempt to adhere to a low sodium/cardiac prudent diet. Reviewed s/sx of CHF exacerbation and indications for seeking immediate medical attention.   Hyperlipidemia Interventions:  (Status:  Goal on track:  Yes.) Long Term Goal Lab Results  Component Value Date   CHOL 149 06/12/2021   HDL 40 06/12/2021   LDLCALC 85 06/12/2021   TRIG 135 06/12/2021   CHOLHDL 3.7 06/12/2021    Medications reviewed Reviewed provider established cholesterol goals Discussed importance of regular laboratory monitoring as prescribed Discussed strategies to manage statin-induced myalgias Reviewed importance of limiting foods high in cholesterol  Hypertension Interventions:   Last practice recorded BP readings:  BP Readings from Last 3 Encounters:  06/12/21 (!) 141/56  03/07/21 (!) 124/47  04/03/20 107/63  Most recent eGFR/CrCl: No results found for: EGFR  No components found for: CRCL  Reviewed medications. Reports taking as prescribed.  Reviewed established blood pressure parameters. Reports  monitoring as advised. Reports home readings have been on the lower end of normal. Reports systolic readings have been normal but diastolic readings have ranged in the high 50's to low 60's. Denies symptoms. Advised to continue monitoring and recording readings. Discussed indications for notifying a provider. Reviewed nutritional intake. Advised to continue reading nutrition labels, monitoring sodium intake, and avoiding highly processed foods when possible. Reviewed s/sx of heart attack, stroke and worsening symptoms that require immediate medical attention.   Medical Device: Patient requested assistance and information r/t DNR device. Confirmed completing DNR documents. He is interested in obtaining a DNR bracelet or necklace.  Reviewed options and available companies to obtain the device. Discussed options of having relevant information engraved (personalized) vs standard silicone bracelets that are easily removed.  He is aware of option to include emergency contact info on the device. He plans to review the various companies and discuss options with his children over the next week. Agreed to contact the care team if he requires additional assistance.   Patient Goals/Self-Care Activities: Take all medications as prescribed Attend all scheduled provider appointments Call pharmacy for medication refills 3-7 days in advance of running out of medications Call provider office for new concerns or questions   Follow Up Plan:   Will follow up within the next month.      PLAN A member of the care management team will follow up within the next month.   [name] Care Management [facility] [phone]

## 2021-06-29 DIAGNOSIS — I10 Essential (primary) hypertension: Secondary | ICD-10-CM

## 2021-06-29 DIAGNOSIS — E785 Hyperlipidemia, unspecified: Secondary | ICD-10-CM

## 2021-06-29 DIAGNOSIS — I480 Paroxysmal atrial fibrillation: Secondary | ICD-10-CM | POA: Diagnosis not present

## 2021-06-29 DIAGNOSIS — I5040 Unspecified combined systolic (congestive) and diastolic (congestive) heart failure: Secondary | ICD-10-CM | POA: Diagnosis not present

## 2021-07-02 ENCOUNTER — Telehealth: Payer: PPO

## 2021-07-15 DIAGNOSIS — H2513 Age-related nuclear cataract, bilateral: Secondary | ICD-10-CM | POA: Diagnosis not present

## 2021-07-25 DIAGNOSIS — R609 Edema, unspecified: Secondary | ICD-10-CM | POA: Diagnosis not present

## 2021-07-25 DIAGNOSIS — I10 Essential (primary) hypertension: Secondary | ICD-10-CM | POA: Diagnosis not present

## 2021-07-25 DIAGNOSIS — N1832 Chronic kidney disease, stage 3b: Secondary | ICD-10-CM | POA: Diagnosis not present

## 2021-07-25 DIAGNOSIS — R82998 Other abnormal findings in urine: Secondary | ICD-10-CM | POA: Diagnosis not present

## 2021-07-25 DIAGNOSIS — I509 Heart failure, unspecified: Secondary | ICD-10-CM | POA: Diagnosis not present

## 2021-07-25 DIAGNOSIS — R799 Abnormal finding of blood chemistry, unspecified: Secondary | ICD-10-CM | POA: Diagnosis not present

## 2021-07-25 DIAGNOSIS — E875 Hyperkalemia: Secondary | ICD-10-CM | POA: Diagnosis not present

## 2021-07-25 DIAGNOSIS — E785 Hyperlipidemia, unspecified: Secondary | ICD-10-CM | POA: Diagnosis not present

## 2021-07-26 ENCOUNTER — Other Ambulatory Visit: Payer: Self-pay | Admitting: Nephrology

## 2021-07-26 DIAGNOSIS — R8290 Unspecified abnormal findings in urine: Secondary | ICD-10-CM

## 2021-07-26 DIAGNOSIS — N1832 Chronic kidney disease, stage 3b: Secondary | ICD-10-CM

## 2021-07-26 DIAGNOSIS — I10 Essential (primary) hypertension: Secondary | ICD-10-CM

## 2021-07-26 DIAGNOSIS — E785 Hyperlipidemia, unspecified: Secondary | ICD-10-CM

## 2021-08-02 ENCOUNTER — Other Ambulatory Visit: Payer: Self-pay

## 2021-08-02 ENCOUNTER — Ambulatory Visit
Admission: RE | Admit: 2021-08-02 | Discharge: 2021-08-02 | Disposition: A | Discharge status: Home / Self Care | Payer: PPO | Source: Ambulatory Visit | Attending: Nephrology | Admitting: Nephrology

## 2021-08-02 DIAGNOSIS — R8290 Unspecified abnormal findings in urine: Secondary | ICD-10-CM | POA: Insufficient documentation

## 2021-08-02 DIAGNOSIS — E785 Hyperlipidemia, unspecified: Secondary | ICD-10-CM | POA: Insufficient documentation

## 2021-08-02 DIAGNOSIS — N2889 Other specified disorders of kidney and ureter: Secondary | ICD-10-CM | POA: Diagnosis not present

## 2021-08-02 DIAGNOSIS — N1832 Chronic kidney disease, stage 3b: Secondary | ICD-10-CM | POA: Diagnosis not present

## 2021-08-02 DIAGNOSIS — I10 Essential (primary) hypertension: Secondary | ICD-10-CM | POA: Diagnosis not present

## 2021-08-13 ENCOUNTER — Other Ambulatory Visit: Payer: Self-pay

## 2021-08-13 ENCOUNTER — Ambulatory Visit
Payer: PPO | Attending: Student in an Organized Health Care Education/Training Program | Admitting: Student in an Organized Health Care Education/Training Program

## 2021-08-13 ENCOUNTER — Encounter: Payer: Self-pay | Admitting: Student in an Organized Health Care Education/Training Program

## 2021-08-13 VITALS — BP 114/47 | HR 42 | Temp 97.2°F | Resp 16 | Ht 74.0 in | Wt 239.0 lb

## 2021-08-13 DIAGNOSIS — M47818 Spondylosis without myelopathy or radiculopathy, sacral and sacrococcygeal region: Secondary | ICD-10-CM

## 2021-08-13 DIAGNOSIS — G8929 Other chronic pain: Secondary | ICD-10-CM | POA: Diagnosis not present

## 2021-08-13 DIAGNOSIS — M5416 Radiculopathy, lumbar region: Secondary | ICD-10-CM | POA: Diagnosis not present

## 2021-08-13 DIAGNOSIS — M961 Postlaminectomy syndrome, not elsewhere classified: Secondary | ICD-10-CM

## 2021-08-13 DIAGNOSIS — M533 Sacrococcygeal disorders, not elsewhere classified: Secondary | ICD-10-CM | POA: Diagnosis not present

## 2021-08-13 DIAGNOSIS — M51369 Other intervertebral disc degeneration, lumbar region without mention of lumbar back pain or lower extremity pain: Secondary | ICD-10-CM | POA: Insufficient documentation

## 2021-08-13 DIAGNOSIS — M5136 Other intervertebral disc degeneration, lumbar region: Secondary | ICD-10-CM | POA: Diagnosis not present

## 2021-08-13 MED ORDER — GABAPENTIN 300 MG PO CAPS: 300.0000 mg | ORAL_CAPSULE | Freq: Two times a day (BID) | ORAL | 2 refills | Status: DC

## 2021-08-13 NOTE — Progress Notes (Signed)
Safety precautions to be maintained throughout the outpatient stay will include: orient to surroundings, keep bed in low position, maintain call bell within reach at all times, provide assistance with transfer out of bed and ambulation.  

## 2021-08-13 NOTE — Progress Notes (Signed)
PROVIDER NOTE: Information contained herein reflects review and annotations entered in association with encounter. Interpretation of such information and data should be left to medically-trained personnel. Information provided to patient can be located elsewhere in the medical record under "Patient Instructions". Document created using STT-dictation technology, any transcriptional errors that may result from process are unintentional.    Patient: [name] Friends Sr.  Service Category: E/M  Provider: Gillis Santa, MD  DOB: 04/30/1941  DOS: 08/13/2021  Specialty: Interventional Pain Management  MRN: [id number]  Setting: Ambulatory outpatient  PCP: Gwyneth Sprout, FNP  Type: Established Patient    Referring Provider: Gwyneth Sprout, FNP  Location: Office  Delivery: Face-to-face     HPI  Reason for encounter: Mr. [name] BOLOGNA Sr., a 81 y.o. year old male, is here today for evaluation and management of his Chronic right SI joint pain [M53.3, G89.29]. Mr. Nestler's primary complain today is Back Pain (Lumbar bilateral ) Last encounter: 03/21/20 Pertinent problems: [name] has Lumbar radiculopathy, chronic; SI joint arthritis; Stage 3b chronic kidney disease ([place]); Longstanding persistent atrial fibrillation ([place]); Lumbar degenerative disc disease; Failed back surgical syndrome; and Chronic right SI joint pain on their pertinent problem list. Pain Assessment: Severity of Chronic pain is reported as a 7 /10. Location: Back Lower, Left, Right/down both legs to just above the ankles. Onset: More than a month ago. Quality: Discomfort, Constant. Timing: Constant. Modifying factor(s): medications, small amount of exercises but is not able to do that unless he has something for pain on board.. Vitals:  height is _0  (1.88 m) and weight is 239 lb (108.4 kg). His temporal temperature is 97.2 F (36.2 C) (abnormal). His blood pressure is 114/47 (abnormal) and his pulse is 42 (abnormal). His respiration is 16  and oxygen saturation is 100%.    [name] follows up today for medication management gabapentin 300 mg twice a day which does provide analgesic benefit and improvement in functional status.  Unfortunately he has been without this medication since September.  His pain overall has been higher as a result.  He has a history of lumbar spine fusion at L2-L3 and severe lumbar facet disease in his lower lumbar spine.  He has had multiple lumbar epidural steroid injections in the past with physical medicine and rehab at [facility] which were not effective.  He is on Xarelto for atrial fibrillation.    ROS  Constitutional: Denies any fever or chills Gastrointestinal: No reported hemesis, hematochezia, vomiting, or acute GI distress Musculoskeletal:  Low back, right hip, right buttock, right SI joint pain Neurological: No reported episodes of acute onset apraxia, aphasia, dysarthria, agnosia, amnesia, paralysis, loss of coordination, or loss of consciousness  Medication Review  amiodarone, furosemide, gabapentin, hydrochlorothiazide, levothyroxine, lisinopril, lovastatin, metoprolol succinate, and rivaroxaban  History Review  Allergy: [name] has No Known Allergies. Drug: [name]  reports no history of drug use. Alcohol:  reports no history of alcohol use. Tobacco:  reports that he quit smoking about 14 years ago. His smoking use included cigarettes. He has a 100.00 pack-year smoking history. He has never used smokeless tobacco. Social: [name]  reports that he quit smoking about 14 years ago. His smoking use included cigarettes. He has a 100.00 pack-year smoking history. He has never used smokeless tobacco. He reports that he does not drink alcohol and does not use drugs. Medical:  has a past medical history of Allergy, History of chicken pox, Hyperlipidemia, and Hypertension. Surgical: [name]  has a past surgical history that includes polyp excision (09/2005); Back  surgery; doppler echocardiography (06/21/2013); Myocardial perfusion scan (10/2012); and Upper gastrointestinal endoscopy (03/27/2004). Family: family history includes Alzheimer's disease in his mother; Colon cancer in his brother; Heart attack in his father.  Laboratory Chemistry Profile   Renal Lab Results  Component Value Date   BUN 25 04/04/2020   CREATININE 1.86 (H) 04/04/2020   BCR 13 04/04/2020   GFRAA 39 (L) 04/04/2020   GFRNONAA 34 (L) 04/04/2020     Hepatic Lab Results  Component Value Date   AST 25 04/04/2020   ALT 28 04/04/2020   ALBUMIN 4.1 04/04/2020   ALKPHOS 84 04/04/2020     Electrolytes Lab Results  Component Value Date   NA 139 04/04/2020   K 4.5 04/04/2020   CL 102 04/04/2020   CALCIUM 9.4 04/04/2020   MG 1.9 05/31/2015   PHOS 2.9 06/02/2016     Bone No results found for: VD25OH, VD125OH2TOT, YT0354SF6, CL2751ZG0, 25OHVITD1, 25OHVITD2, 25OHVITD3, TESTOFREE, TESTOSTERONE   Inflammation (CRP: Acute Phase) (ESR: Chronic Phase) No results found for: CRP, ESRSEDRATE, LATICACIDVEN     Note: Above Lab results reviewed.  Recent Imaging Review  US RENAL CLINICAL DATA:  Abnormal finding in urine.  EXAM: RENAL / URINARY TRACT ULTRASOUND COMPLETE  COMPARISON:  None.  FINDINGS: Right Kidney:  Renal measurements: 10.4 x 5.4 x 4.6 cm = volume: 136 mL. Increased cortical echogenicity.  Left Kidney:  Renal measurements: 11.0 x 5.8 x 5.4 cm = volume: 179.3 mL. Increased cortical echogenicity.  Bladder:  Appears normal for degree of bladder distention.  Other:  None.  IMPRESSION: Increased cortical echogenicity in the kidneys consistent with medical renal disease. No other abnormalities.  Electronically Signed   By: Dorise Bullion III M.D.   On: 08/04/2021 19:23 Note: Reviewed        Physical Exam  General appearance: Well nourished, well developed, and well hydrated. In no apparent acute distress Mental status: Alert, oriented x 3  (person, place, & time)       Respiratory: No evidence of acute respiratory distress Eyes: PERLA Vitals: BP (!) 114/47 (BP Location: Right Arm, Patient Position: Sitting, Cuff Size: Large)    Pulse (!) 42    Temp (!) 97.2 F (36.2 C) (Temporal)    Resp 16    Ht _0  (1.88 m)    Wt 239 lb (108.4 kg)    SpO2 100%    BMI 30.69 kg/m  BMI: Estimated body mass index is 30.69 kg/m as calculated from the following:   Height as of this encounter: _1  (1.88 m).   Weight as of this encounter: 239 lb (108.4 kg). Ideal: Ideal body weight: 82.2 kg (181 lb 3.5 oz) Adjusted ideal body weight: 92.7 kg (204 lb 5.3 oz)   Lumbar Spine Area Exam  Skin & Axial Inspection: Well healed scar from previous spine surgery detected Alignment: Symmetrical Functional ROM: Pain restricted ROM       Stability: No instability detected Muscle Tone/Strength: Functionally intact. No obvious neuro-muscular anomalies detected. Sensory (Neurological): Musculoskeletal pain pattern Palpation: No palpable anomalies       Provocative Tests:  Patrick's Maneuver: (+) for right-sided S-I arthralgia              Gait & Posture Assessment  Ambulation: Patient came in today in a wheel chair Gait: Limited. Using assistive device to ambulate Posture: Difficulty standing up straight, due to pain  Lower Extremity Exam  Side: Right lower extremity  Side: Left lower extremity  Stability: No instability observed          Stability: No instability observed          Skin & Extremity Inspection: Skin color, temperature, and hair growth are WNL. No peripheral edema or cyanosis. No masses, redness, swelling, asymmetry, or associated skin lesions. No contractures.  Skin & Extremity Inspection: Skin color, temperature, and hair growth are WNL. No peripheral edema or cyanosis. No masses, redness, swelling, asymmetry, or associated skin lesions. No contractures.  Functional ROM: Pain restricted ROM for hip joint          Functional ROM:  Unrestricted ROM                  Muscle Tone/Strength: Functionally intact. No obvious neuro-muscular anomalies detected.  Muscle Tone/Strength: Functionally intact. No obvious neuro-muscular anomalies detected.  Sensory (Neurological): Arthropathic arthralgia        Sensory (Neurological): Unimpaired        DTR: Patellar: deferred today Achilles: deferred today Plantar: deferred today  DTR: Patellar: deferred today Achilles: deferred today Plantar: deferred today  Palpation: No palpable anomalies  Palpation: No palpable anomalies    Assessment   Status Diagnosis  Persistent Persistent Persistent 1. Chronic right SI joint pain   2. SI joint arthritis   3. Sacroiliac joint dysfunction of right side   4. Lumbar radiculopathy, chronic   5. Failed back surgical syndrome   6. Lumbar degenerative disc disease      Updated Problems: Problem  Lumbar Degenerative Disc Disease  Failed Back Surgical Syndrome  Chronic Right Si Joint Pain  Stage 3b Chronic Kidney Disease ([facility])  Longstanding Persistent Atrial Fibrillation ([facility])  Lumbar Radiculopathy, Chronic  Si Joint Arthritis     Plan of Care  Mr. WILLIE PLAIN Sr. has a current medication list which includes the following long-term medication(s): amiodarone, furosemide, gabapentin, hydrochlorothiazide, levothyroxine, lisinopril, lovastatin, metoprolol succinate, and xarelto.  1.  Refill gabapentin as below.  No change in dose  Pharmacotherapy (Medications Ordered): Meds ordered this encounter  Medications   gabapentin (NEURONTIN) 300 MG capsule    Sig: Take 1 capsule (300 mg total) by mouth 2 (two) times daily.    Dispense:  60 capsule    Refill:  2   Follow-up plan:   Return in about 6 weeks (around 09/24/2021) for Medication Management, in person.   Recent Visits No visits were found meeting these conditions. Showing recent visits within past 90 days and meeting all other requirements Today's Visits Date Type  Provider Dept  08/13/21 Office Visit Gillis Santa, MD [facility]  Showing today's visits and meeting all other requirements Future Appointments No visits were found meeting these conditions. Showing future appointments within next 90 days and meeting all other requirements  I discussed the assessment and treatment plan with the patient. The patient was provided an opportunity to ask questions and all were answered. The patient agreed with the plan and demonstrated an understanding of the instructions.  Patient advised to call back or seek an in-person evaluation if the symptoms or condition worsens.  Duration of encounter: 55mnutes.  Note by: BGillis Santa MD Date: 08/13/2021; Time: 10:52 AM

## 2021-08-27 ENCOUNTER — Telehealth: Payer: Self-pay | Admitting: Family Medicine

## 2021-08-27 NOTE — Telephone Encounter (Signed)
No answer unable to leave a message for patient to call back and schedule Medicare Annual Wellness Visit (AWV) in office.   If not able to come in office, please offer to do virtually or by telephone.   Last AWV: 02/23/2020  Please schedule at anytime with [facility] Health Advisor.  If any questions, please contact me at [phone]

## 2021-08-29 DIAGNOSIS — R609 Edema, unspecified: Secondary | ICD-10-CM | POA: Diagnosis not present

## 2021-08-29 DIAGNOSIS — E785 Hyperlipidemia, unspecified: Secondary | ICD-10-CM | POA: Diagnosis not present

## 2021-08-29 DIAGNOSIS — N1832 Chronic kidney disease, stage 3b: Secondary | ICD-10-CM | POA: Diagnosis not present

## 2021-08-29 DIAGNOSIS — N2581 Secondary hyperparathyroidism of renal origin: Secondary | ICD-10-CM | POA: Diagnosis not present

## 2021-08-29 DIAGNOSIS — N184 Chronic kidney disease, stage 4 (severe): Secondary | ICD-10-CM | POA: Diagnosis not present

## 2021-08-29 DIAGNOSIS — R82998 Other abnormal findings in urine: Secondary | ICD-10-CM | POA: Diagnosis not present

## 2021-08-29 DIAGNOSIS — I509 Heart failure, unspecified: Secondary | ICD-10-CM | POA: Diagnosis not present

## 2021-08-29 DIAGNOSIS — I10 Essential (primary) hypertension: Secondary | ICD-10-CM | POA: Diagnosis not present

## 2021-08-29 DIAGNOSIS — I959 Hypotension, unspecified: Secondary | ICD-10-CM | POA: Diagnosis not present

## 2021-09-02 ENCOUNTER — Ambulatory Visit (INDEPENDENT_AMBULATORY_CARE_PROVIDER_SITE_OTHER): Payer: PPO

## 2021-09-02 VITALS — Ht 74.0 in | Wt 249.0 lb

## 2021-09-02 DIAGNOSIS — Z0000 Encounter for general adult medical examination without abnormal findings: Secondary | ICD-10-CM

## 2021-09-02 NOTE — Patient Instructions (Signed)
[name] , Thank you for taking time to come for your Medicare Wellness Visit. I appreciate your ongoing commitment to your health goals. Please review the following plan we discussed and let me know if I can assist you in the future.   Screening recommendations/referrals: Colonoscopy: aged out Recommended yearly ophthalmology/optometry visit for glaucoma screening and checkup Recommended yearly dental visit for hygiene and checkup  Vaccinations: Influenza vaccine: pt states had flu shot at pharmacy Pneumococcal vaccine: 06/04/15 Tdap vaccine: 04/21/14 Shingles vaccine: n/d    Covid-19: n/d  Advanced directives: no  Conditions/risks identified: none  Next appointment: Follow up in one year for your annual wellness visit. 09/04/22 @ 8:20am by phone  Preventive Care 65 Years and Older, Male Preventive care refers to lifestyle choices and visits with your health care provider that can promote health and wellness. What does preventive care include? A yearly physical exam. This is also called an annual well check. Dental exams once or twice a year. Routine eye exams. Ask your health care provider how often you should have your eyes checked. Personal lifestyle choices, including: Daily care of your teeth and gums. Regular physical activity. Eating a healthy diet. Avoiding tobacco and drug use. Limiting alcohol use. Practicing safe sex. Taking low doses of aspirin every day. Taking vitamin and mineral supplements as recommended by your health care provider. What happens during an annual well check? The services and screenings done by your health care provider during your annual well check will depend on your age, overall health, lifestyle risk factors, and family history of disease. Counseling  Your health care provider may ask you questions about your: Alcohol use. Tobacco use. Drug use. Emotional well-being. Home and relationship well-being. Sexual activity. Eating  habits. History of falls. Memory and ability to understand (cognition). Work and work Statistician. Screening  You may have the following tests or measurements: Height, weight, and BMI. Blood pressure. Lipid and cholesterol levels. These may be checked every 5 years, or more frequently if you are over 32 years old. Skin check. Lung cancer screening. You may have this screening every year starting at age 26 if you have a 30-pack-year history of smoking and currently smoke or have quit within the past 15 years. Fecal occult blood test (FOBT) of the stool. You may have this test every year starting at age 72. Flexible sigmoidoscopy or colonoscopy. You may have a sigmoidoscopy every 5 years or a colonoscopy every 10 years starting at age 54. Prostate cancer screening. Recommendations will vary depending on your family history and other risks. Hepatitis C blood test. Hepatitis B blood test. Sexually transmitted disease (STD) testing. Diabetes screening. This is done by checking your blood sugar (glucose) after you have not eaten for a while (fasting). You may have this done every 1-3 years. Abdominal aortic aneurysm (AAA) screening. You may need this if you are a current or former smoker. Osteoporosis. You may be screened starting at age 61 if you are at high risk. Talk with your health care provider about your test results, treatment options, and if necessary, the need for more tests. Vaccines  Your health care provider may recommend certain vaccines, such as: Influenza vaccine. This is recommended every year. Tetanus, diphtheria, and acellular pertussis (Tdap, Td) vaccine. You may need a Td booster every 10 years. Zoster vaccine. You may need this after age 10. Pneumococcal 13-valent conjugate (PCV13) vaccine. One dose is recommended after age 96. Pneumococcal polysaccharide (PPSV23) vaccine. One dose is recommended after age 65.  Talk to your health care provider about which screenings and  vaccines you need and how often you need them. This information is not intended to replace advice given to you by your health care provider. Make sure you discuss any questions you have with your health care provider. Document Released: 07/13/2015 Document Revised: 03/05/2016 Document Reviewed: 04/17/2015 Elsevier Interactive Patient Education  2017 [place] Prevention in the Home Falls can cause injuries. They can happen to people of all ages. There are many things you can do to make your home safe and to help prevent falls. What can I do on the outside of my home? Regularly fix the edges of walkways and driveways and fix any cracks. Remove anything that might make you trip as you walk through a door, such as a raised step or threshold. Trim any bushes or trees on the path to your home. Use bright outdoor lighting. Clear any walking paths of anything that might make someone trip, such as rocks or tools. Regularly check to see if handrails are loose or broken. Make sure that both sides of any steps have handrails. Any raised decks and porches should have guardrails on the edges. Have any leaves, snow, or ice cleared regularly. Use sand or salt on walking paths during winter. Clean up any spills in your garage right away. This includes oil or grease spills. What can I do in the bathroom? Use night lights. Install grab bars by the toilet and in the tub and shower. Do not use towel bars as grab bars. Use non-skid mats or decals in the tub or shower. If you need to sit down in the shower, use a plastic, non-slip stool. Keep the floor dry. Clean up any water that spills on the floor as soon as it happens. Remove soap buildup in the tub or shower regularly. Attach bath mats securely with double-sided non-slip rug tape. Do not have throw rugs and other things on the floor that can make you trip. What can I do in the bedroom? Use night lights. Make sure that you have a light by your  bed that is easy to reach. Do not use any sheets or blankets that are too big for your bed. They should not hang down onto the floor. Have a firm chair that has side arms. You can use this for support while you get dressed. Do not have throw rugs and other things on the floor that can make you trip. What can I do in the kitchen? Clean up any spills right away. Avoid walking on wet floors. Keep items that you use a lot in easy-to-reach places. If you need to reach something above you, use a strong step stool that has a grab bar. Keep electrical cords out of the way. Do not use floor polish or wax that makes floors slippery. If you must use wax, use non-skid floor wax. Do not have throw rugs and other things on the floor that can make you trip. What can I do with my stairs? Do not leave any items on the stairs. Make sure that there are handrails on both sides of the stairs and use them. Fix handrails that are broken or loose. Make sure that handrails are as long as the stairways. Check any carpeting to make sure that it is firmly attached to the stairs. Fix any carpet that is loose or worn. Avoid having throw rugs at the top or bottom of the stairs. If you do have throw  rugs, attach them to the floor with carpet tape. Make sure that you have a light switch at the top of the stairs and the bottom of the stairs. If you do not have them, ask someone to add them for you. What else can I do to help prevent falls? Wear shoes that: Do not have high heels. Have rubber bottoms. Are comfortable and fit you well. Are closed at the toe. Do not wear sandals. If you use a stepladder: Make sure that it is fully opened. Do not climb a closed stepladder. Make sure that both sides of the stepladder are locked into place. Ask someone to hold it for you, if possible. Clearly mark and make sure that you can see: Any grab bars or handrails. First and last steps. Where the edge of each step is. Use tools that  help you move around (mobility aids) if they are needed. These include: Canes. Walkers. Scooters. Crutches. Turn on the lights when you go into a dark area. Replace any light bulbs as soon as they burn out. Set up your furniture so you have a clear path. Avoid moving your furniture around. If any of your floors are uneven, fix them. If there are any pets around you, be aware of where they are. Review your medicines with your doctor. Some medicines can make you feel dizzy. This can increase your chance of falling. Ask your doctor what other things that you can do to help prevent falls. This information is not intended to replace advice given to you by your health care provider. Make sure you discuss any questions you have with your health care provider. Document Released: 04/12/2009 Document Revised: 11/22/2015 Document Reviewed: 07/21/2014 Elsevier Interactive Patient Education  2017 [employer].

## 2021-09-02 NOTE — Progress Notes (Signed)
Virtual Visit via Telephone Note  I connected with  [name] AKER Sr. on 09/02/21 at  8:20 AM EST by telephone and verified that I am speaking with the correct person using two identifiers.  Location: Patient: home Provider: [facility] Persons participating in the virtual visit: [place]   I discussed the limitations, risks, security and privacy concerns of performing an evaluation and management service by telephone and the availability of in person appointments. The patient expressed understanding and agreed to proceed.  Interactive audio and video telecommunications were attempted between this nurse and patient, however failed, due to patient having technical difficulties OR patient did not have access to video capability.  We continued and completed visit with audio only.  Some vital signs may be absent or patient reported.   Dionisio David, LPN  Subjective:   [name] KAZMI Sr. is a 81 y.o. male who presents for Medicare Annual/Subsequent preventive examination.  Review of Systems           Objective:    Today's Vitals   09/02/21 0824 09/02/21 0825  Weight: 249 lb (112.9 kg)   Height: 6\' 2"  (1.88 m)   PainSc:  0-No pain   Body mass index is 31.97 kg/m.  Advanced Directives 02/23/2020 02/20/2020 02/13/2020 02/17/2019 08/19/2018 06/01/2018 05/04/2018  Does Patient Have a Medical Advance Directive? Yes Yes Yes Yes Yes Yes Yes  Type of Advance Directive (No Data) Living will [place];Living will [place];Living will [place];Living will [place];Living will [place];Living will  Copy of [place] in Chart? - - - No - copy requested No - copy requested - Yes  Would patient like information on creating a medical advance directive? - - - - - - -    Current Medications (verified) Outpatient Encounter Medications as of 09/02/2021  Medication Sig    amiodarone (PACERONE) 200 MG tablet Take 1 tablet (200 mg total) by mouth daily.   furosemide (LASIX) 20 MG tablet TAKE 1 TABLET(20 MG) BY MOUTH DAILY AS NEEDED FOR FLUID RETENTION   gabapentin (NEURONTIN) 300 MG capsule Take 1 capsule (300 mg total) by mouth 2 (two) times daily.   hydrochlorothiazide (MICROZIDE) 12.5 MG capsule Take 1 capsule (12.5 mg total) by mouth daily.   levothyroxine (SYNTHROID) 100 MCG tablet TAKE 1 TABLET(100 MCG) BY MOUTH DAILY BEFORE BREAKFAST   lisinopril (ZESTRIL) 10 MG tablet Take 1 tablet (10 mg total) by mouth daily.   lovastatin (MEVACOR) 20 MG tablet Take 1 tablet (20 mg total) by mouth daily.   metoprolol succinate (TOPROL-XL) 25 MG 24 hr tablet 1 TABLET BY MOUTH ONCE A DAY   XARELTO 20 MG TABS tablet Take 1 tablet (20 mg total) by mouth daily.   No facility-administered encounter medications on file as of 09/02/2021.    Allergies (verified) Patient has no known allergies.   History: Past Medical History:  Diagnosis Date   Allergy    History of chicken pox    Hyperlipidemia    Hypertension    Past Surgical History:  Procedure Laterality Date   BACK SURGERY     x5   DOPPLER ECHOCARDIOGRAPHY  06/21/2013   EF=35% while in Sinus Rhythm   Myocardial perfusion scan  10/2012   severe global LV enlargement. Mild RV enlargement. Mild LVH. Mild mitral and tricuspid insufficency   polyp excision  09/2005   UPPER GASTROINTESTINAL ENDOSCOPY  03/27/2004   Gastropathy, Gastritis;  Duodenopathy   Family History  Problem Relation Age of Onset   Alzheimer's disease Mother    Heart attack Father    Colon cancer Brother    Social History   Socioeconomic History   Marital status: Widowed    Spouse name: Not on file   Number of children: 2   Years of education: Not on file   Highest education level: 12th grade  Occupational History   Occupation: Retired/ Disabled  Tobacco Use   Smoking status: Former    Packs/day: 2.00    Years: 50.00    Pack  years: 100.00    Types: Cigarettes    Quit date: 07/01/2007    Years since quitting: 14.1   Smokeless tobacco: Never  Vaping Use   Vaping Use: Never used  Substance and Sexual Activity   Alcohol use: No    Alcohol/week: 0.0 standard drinks   Drug use: No   Sexual activity: Not on file  Other Topics Concern   Not on file  Social History Narrative   Not on file   Social Determinants of Health   Financial Resource Strain: Not on file  Food Insecurity: No Food Insecurity   Worried About Running Out of Food in the Last Year: Never true   [place] in the Last Year: Never true  Transportation Needs: No Transportation Needs   Lack of Transportation (Medical): No   Lack of Transportation (Non-Medical): No  Physical Activity: Not on file  Stress: Not on file  Social Connections: Not on file    Tobacco Counseling Counseling given: Not Answered   Clinical Intake:  Pre-visit preparation completed: Yes  Pain : No/denies pain Pain Score: 0-No pain     Nutritional Risks: None Diabetes: No  How often do you need to have someone help you when you read instructions, pamphlets, or other written materials from your doctor or pharmacy?: 1 - Never  Diabetic?no  Interpreter Needed?: No  Information entered by :: Kirke Shaggy, LPN   Activities of Daily Living In your present state of health, do you have any difficulty performing the following activities: 06/12/2021 03/07/2021  Hearing? N N  Vision? N N  Difficulty concentrating or making decisions? N N  Walking or climbing stairs? Y Y  Dressing or bathing? N N  Doing errands, shopping? N N  Some recent data might be hidden    Patient Care Team: Gwyneth Sprout, FNP as PCP - General (Family Medicine) Isaias Cowman, MD as Consulting Physician (Cardiology) Gillis Santa, MD as Consulting Physician (Pain Medicine) Neldon Labella, RN as Case Manager  Indicate any recent Medical Services you may have received from  other than [facility] providers in the past year (date may be approximate).     Assessment:   This is a routine wellness examination for [name].  Hearing/Vision screen No results found.  Dietary issues and exercise activities discussed:     Goals Addressed   None    Depression Screen PHQ 2/9 Scores 06/12/2021 03/07/2021 02/11/2021 02/20/2020 02/13/2020 02/17/2019 08/19/2018  PHQ - 2 Score 0 0 0 0 0 0 0  PHQ- 9 Score 0 0 - - - - -    Fall Risk Fall Risk  08/13/2021 03/07/2021 02/11/2021 02/23/2020 02/20/2020  Falls in the past year? 0 0 0 0 0  Number falls in past yr: 0 0 0 0 0  Injury with Fall? - 0 - 0 0  Risk for fall due to : No Fall Risks No  Fall Risks - - -  Follow up Falls evaluation completed Falls evaluation completed Falls prevention discussed - Falls evaluation completed    FALL RISK PREVENTION PERTAINING TO THE HOME:  Any stairs in or around the home? No  If so, are there any without handrails? No  Home free of loose throw rugs in walkways, pet beds, electrical cords, etc? Yes  Adequate lighting in your home to reduce risk of falls? Yes   ASSISTIVE DEVICES UTILIZED TO PREVENT FALLS:  Life alert? No  Use of a cane, walker or w/c? Yes  Grab bars in the bathroom? Yes  Shower chair or bench in shower? No  Elevated toilet seat or a handicapped toilet? No   Cognitive Function:Normal cognitive status assessed by direct observation by this Nurse Health Advisor. No abnormalities found.          Immunizations Immunization History  Administered Date(s) Administered   Fluad Quad(high Dose 65+) 04/03/2020   Influenza Split 03/22/2012   Influenza, High Dose Seasonal PF 05/23/2014, 06/04/2015, 03/18/2017, 03/17/2018   Pneumococcal Conjugate-13 05/23/2014   Pneumococcal Polysaccharide-23 06/04/2015   Tdap 04/21/2014    TDAP status: Up to date  Flu Vaccine status: Up to date  Pneumococcal vaccine status: Up to date  Covid-19 vaccine status: Declined, Education has been  provided regarding the importance of this vaccine but patient still declined. Advised may receive this vaccine at local pharmacy or Health Dept.or vaccine clinic. Aware to provide a copy of the vaccination record if obtained from local pharmacy or Health Dept. Verbalized acceptance and understanding.  Qualifies for Shingles Vaccine? Yes   Zostavax completed No   Shingrix Completed?: No.    Education has been provided regarding the importance of this vaccine. Patient has been advised to call insurance company to determine out of pocket expense if they have not yet received this vaccine. Advised may also receive vaccine at local pharmacy or Health Dept. Verbalized acceptance and understanding.  Screening Tests Health Maintenance  Topic Date Due   COVID-19 Vaccine (1) Never done   Zoster Vaccines- Shingrix (1 of 2) Never done   INFLUENZA VACCINE  09/28/2021 (Originally 01/28/2021)   TETANUS/TDAP  04/21/2024   Pneumonia Vaccine 91+ Years old  Completed   HPV VACCINES  Aged Out    Health Maintenance  Health Maintenance Due  Topic Date Due   COVID-19 Vaccine (1) Never done   Zoster Vaccines- Shingrix (1 of 2) Never done    Colorectal cancer screening: No longer required.   Lung Cancer Screening: (Low Dose CT Chest recommended if Age 69-80 years, 30 pack-year currently smoking OR have quit w/in 15years.) does not qualify.    Additional Screening:  Hepatitis C Screening: does qualify; Completed no  Vision Screening: Recommended annual ophthalmology exams for early detection of glaucoma and other disorders of the eye. Is the patient up to date with their annual eye exam?  Yes  Who is the provider or what is the name of the office in which the patient attends annual eye exams? [facility] If pt is not established with a provider, would they like to be referred to a provider to establish care? No .   Dental Screening: Recommended annual dental exams for proper oral  hygiene  Community Resource Referral / Chronic Care Management: CRR required this visit?  No   CCM required this visit?  No      Plan:     I have personally reviewed and noted the following in the patients  chart:   Medical and social history Use of alcohol, tobacco or illicit drugs  Current medications and supplements including opioid prescriptions. Patient is not currently taking opioid prescriptions. Functional ability and status Nutritional status Physical activity Advanced directives List of other physicians Hospitalizations, surgeries, and ER visits in previous 12 months Vitals Screenings to include cognitive, depression, and falls Referrals and appointments  In addition, I have reviewed and discussed with patient certain preventive protocols, quality metrics, and best practice recommendations. A written personalized care plan for preventive services as well as general preventive health recommendations were provided to patient.     Dionisio David, LPN   12/07/5070   Nurse Notes: none

## 2021-09-05 ENCOUNTER — Ambulatory Visit (INDEPENDENT_AMBULATORY_CARE_PROVIDER_SITE_OTHER): Payer: PPO

## 2021-09-05 DIAGNOSIS — E785 Hyperlipidemia, unspecified: Secondary | ICD-10-CM

## 2021-09-05 DIAGNOSIS — I10 Essential (primary) hypertension: Secondary | ICD-10-CM

## 2021-09-05 DIAGNOSIS — I5040 Unspecified combined systolic (congestive) and diastolic (congestive) heart failure: Secondary | ICD-10-CM

## 2021-09-05 DIAGNOSIS — I480 Paroxysmal atrial fibrillation: Secondary | ICD-10-CM

## 2021-09-05 NOTE — Patient Instructions (Signed)
Thank you for allowing the Chronic Care Management team to participate in your care.  

## 2021-09-05 NOTE — Chronic Care Management (AMB) (Incomplete)
°  Chronic Care Management   CCM RN Visit Note  09/05/2021 Name: [name] COLBERG Sr. MRN: [id number] DOB: 10-22-1940  Subjective: [name] Friends Sr. is a 81 y.o. year old male who is a primary care patient of Gwyneth Sprout, FNP. The care management team was consulted for assistance with disease management and care coordination needs.    {CCMTELEPHONEFACETOFACE:21091510} for {CCMINITIALFOLLOWUPCHOICE:21091511} in response to provider referral for case management and/or care coordination services.   Consent to Services:  {CCMCONSENTOPTIONS:25074}  Patient agreed to services and verbal consent obtained.   Assessment: Review of patient past medical history, allergies, medications, health status, including review of consultants reports, laboratory and other test data, was performed as part of comprehensive evaluation and provision of chronic care management services.   SDOH (Social Determinants of Health) assessments and interventions performed:    CCM Care Plan  No Known Allergies  Outpatient Encounter Medications as of 09/05/2021  Medication Sig   amiodarone (PACERONE) 200 MG tablet Take 1 tablet (200 mg total) by mouth daily.   furosemide (LASIX) 20 MG tablet TAKE 1 TABLET(20 MG) BY MOUTH DAILY AS NEEDED FOR FLUID RETENTION   gabapentin (NEURONTIN) 300 MG capsule Take 1 capsule (300 mg total) by mouth 2 (two) times daily.   hydrochlorothiazide (MICROZIDE) 12.5 MG capsule Take 1 capsule (12.5 mg total) by mouth daily.   levothyroxine (SYNTHROID) 100 MCG tablet TAKE 1 TABLET(100 MCG) BY MOUTH DAILY BEFORE BREAKFAST   lisinopril (ZESTRIL) 10 MG tablet Take 1 tablet (10 mg total) by mouth daily.   lovastatin (MEVACOR) 20 MG tablet Take 1 tablet (20 mg total) by mouth daily.   metoprolol succinate (TOPROL-XL) 25 MG 24 hr tablet 1 TABLET BY MOUTH ONCE A DAY   XARELTO 20 MG TABS tablet Take 1 tablet (20 mg total) by mouth daily.   No facility-administered encounter medications on file as of  09/05/2021.    Patient Active Problem List   Diagnosis Date Noted   Lumbar degenerative disc disease 08/13/2021   Failed back surgical syndrome 08/13/2021   Chronic right SI joint pain 08/13/2021   Edema, unspecified 07/25/2021   Other abnormal findings in urine 07/25/2021   Heart failure, unspecified ([place]) 07/25/2021   Annual physical exam 06/12/2021   Elevated serum creatinine 06/12/2021   Stage 3b chronic kidney disease ([place]) 06/12/2021   Encounter for lipid screening for cardiovascular disease 06/12/2021   Screening for prostate cancer 06/12/2021   Longstanding persistent atrial fibrillation ([place]) 06/12/2021   Colonoscopy refused 06/12/2021   CHF (congestive heart failure), NYHA class II, unspecified failure chronicity, combined ([place]) 06/12/2021   Use of cane as ambulatory aid 06/12/2021   Lumbar radiculopathy, chronic 02/13/2020   SI joint arthritis 02/13/2020   Pre-diabetes 03/18/2017   Hypothyroidism 12/19/2014   Hypertension 12/19/2014   Other specified postprocedural states 02/17/2013   Dilated cardiomyopathy ([place]) 02/17/2013   S/P cardiac cath 02/17/2013    Conditions to be addressed/monitored:{CCM ASSESSMENT DZ OPTIONS:25047}  There are no care plans that you recently modified to display for this patient.   Plan:{CM FOLLOW UP PLAN:25073} SIG***

## 2021-09-18 DIAGNOSIS — M545 Low back pain, unspecified: Secondary | ICD-10-CM | POA: Diagnosis not present

## 2021-09-18 DIAGNOSIS — M5136 Other intervertebral disc degeneration, lumbar region: Secondary | ICD-10-CM | POA: Diagnosis not present

## 2021-09-18 DIAGNOSIS — M5416 Radiculopathy, lumbar region: Secondary | ICD-10-CM | POA: Diagnosis not present

## 2021-09-18 DIAGNOSIS — M48061 Spinal stenosis, lumbar region without neurogenic claudication: Secondary | ICD-10-CM | POA: Diagnosis not present

## 2021-09-19 DIAGNOSIS — M5126 Other intervertebral disc displacement, lumbar region: Secondary | ICD-10-CM | POA: Diagnosis not present

## 2021-09-19 DIAGNOSIS — M5416 Radiculopathy, lumbar region: Secondary | ICD-10-CM | POA: Diagnosis not present

## 2021-09-24 ENCOUNTER — Encounter: Payer: Self-pay | Admitting: Student in an Organized Health Care Education/Training Program

## 2021-09-24 ENCOUNTER — Other Ambulatory Visit: Payer: Self-pay

## 2021-09-24 ENCOUNTER — Ambulatory Visit
Payer: PPO | Attending: Student in an Organized Health Care Education/Training Program | Admitting: Student in an Organized Health Care Education/Training Program

## 2021-09-24 DIAGNOSIS — M533 Sacrococcygeal disorders, not elsewhere classified: Secondary | ICD-10-CM

## 2021-09-24 DIAGNOSIS — M5416 Radiculopathy, lumbar region: Secondary | ICD-10-CM | POA: Diagnosis not present

## 2021-09-24 DIAGNOSIS — M961 Postlaminectomy syndrome, not elsewhere classified: Secondary | ICD-10-CM | POA: Diagnosis not present

## 2021-09-24 DIAGNOSIS — M5136 Other intervertebral disc degeneration, lumbar region: Secondary | ICD-10-CM | POA: Diagnosis not present

## 2021-09-24 DIAGNOSIS — M47818 Spondylosis without myelopathy or radiculopathy, sacral and sacrococcygeal region: Secondary | ICD-10-CM | POA: Diagnosis not present

## 2021-09-24 DIAGNOSIS — G894 Chronic pain syndrome: Secondary | ICD-10-CM | POA: Diagnosis not present

## 2021-09-24 MED ORDER — GABAPENTIN 300 MG PO CAPS: 300.0000 mg | ORAL_CAPSULE | Freq: Two times a day (BID) | ORAL | 5 refills | Status: DC

## 2021-09-24 NOTE — Progress Notes (Signed)
Patient: [name] BROCKMAN Sr.  Service Category: E/M  Provider: Gillis Santa, MD  ?DOB: May 20, 1941  DOS: 09/24/2021  Location: Office  ?MRN: [id number]  Setting: Ambulatory outpatient  Referring Provider: Gwyneth Sprout, FNP  ?Type: Established Patient  Specialty: Interventional Pain Management  PCP: Gwyneth Sprout, FNP  ?Location: Remote location  Delivery: TeleHealth    ? ?Virtual Encounter - Pain Management ?PROVIDER NOTE: Information contained herein reflects review and annotations entered in association with encounter. Interpretation of such information and data should be left to medically-trained personnel. Information provided to patient can be located elsewhere in the medical record under "Patient Instructions". Document created using STT-dictation technology, any transcriptional errors that may result from process are unintentional.  ?  ?Contact & Pharmacy ?Preferred: [phone] ?Home: [phone] (home) ?Mobile: [phone] (mobile) ?E-mail: No e-mail address on record  ?[facility] [place], [place] ?[place] ?[place] [address] ?Phone: [phone] Fax: [phone] ? ?[facility] [place], [place] AT [facility] OF [address] ?[place] ?[place] [address] ?Phone: [phone] Fax: [phone] ? ?[place] [place] ?[address] ?[place] [address] ?Phone: [phone] Fax: [phone] ? ?[address] [place], [place] [address] [address] ?[phone] ?[place] [address] ?Phone: [phone] Fax: [phone] ?  ?Pre-screening  ?Mr. Jetter offered "in-person" vs "virtual" encounter. He indicated preferring virtual for this encounter.  ? ?Reason ?COVID-19*  Social distancing based on CDC and AMA recommendations.  ? ?I contacted ABRAHM MANCIA Sr. on 09/24/2021 via telephone.       I clearly identified myself as Gillis Santa, MD. I verified that I was speaking with the correct person using two identifiers (Name: [name] BRANDER Sr., and date of birth: September 26, 1940). ? ?Consent ?I sought verbal advanced consent from [place] for virtual visit interactions. I informed Mr. Costabile of possible security and privacy concerns, risks, and limitations associated with providing "not-in-person" medical evaluation and management services. I also informed Mr. Chopin of the availability of "in-person" appointments. Finally, I informed him that there would be a charge for the virtual visit and that he could be  personally, fully or partially, financially responsible for it. Mr. Lohmeyer expressed understanding and agreed to proceed.  ? ?Historic Elements   ?Mr. [name] SPANG Sr. is a 81 y.o. year old, male patient evaluated today after our last contact on 08/13/2021. Mr. Bolls  has a past medical history of Allergy, History of chicken pox, Hyperlipidemia, and Hypertension. He also  has a past surgical history that includes polyp excision (09/2005); Back surgery; doppler echocardiography (06/21/2013); Myocardial perfusion scan (10/2012); and Upper gastrointestinal endoscopy (03/27/2004). Mr. Fuhrmann has a current medication list which includes the following prescription(s): amiodarone, furosemide, hydrochlorothiazide, levothyroxine, lisinopril, lisinopril, lovastatin, metoprolol succinate, prednisone, tramadol, xarelto, and gabapentin. He  reports that he quit smoking about 14 years ago. His smoking use included cigarettes. He has a 100.00 pack-year smoking history. He has never used smokeless tobacco. He reports that he does not drink alcohol and does not use drugs. Mr. Merriweather has No Known Allergies.  ? ?HPI  ?Today, he is being contacted for medication management. ? ?Virtual visit for medication management.  Patient requesting refill of gabapentin.  He states that he had a right  L2-L3 transforaminal epidural  steroid injection performed 09/19/2021 with Dr. Sharlet Salina. ?In the consult note from Dr. Sharlet Salina on 09/18/2021 it states that [name] does not experience any pain relief with gabapentin.  When I clarified that today, he states that he would like a refill of the gabapentin as it does help him sleep. ?I explained to him that because he is seeing Dr. Sharlet Salina for injections, their team, including Whitney Meeler, can assist with Gabapentin management and further injections as needed. ?He does not need to follow up here any longer since he is seeing PM&R. This was explained to patient. ? ?Laboratory Chemistry Profile  ? ?Renal ?Lab Results  ?Component Value Date  ? BUN 25 04/04/2020  ? CREATININE 1.86 (H) 04/04/2020  ? BCR 13 04/04/2020  ? GFRAA 39 (L) 04/04/2020  ? GFRNONAA 34 (L) 04/04/2020  ?  Hepatic ?Lab Results  ?Component Value Date  ? AST 25 04/04/2020  ? ALT 28 04/04/2020  ? ALBUMIN 4.1 04/04/2020  ? ALKPHOS 84 04/04/2020  ?  ?Electrolytes ?Lab Results  ?Component Value Date  ? NA 139 04/04/2020  ? K 4.5 04/04/2020  ? CL 102 04/04/2020  ? CALCIUM 9.4 04/04/2020  ? MG 1.9 05/31/2015  ? PHOS 2.9 06/02/2016  ?  Bone ?No results found for: [place], [id number], [id number], [id number], 25OHVITD1, 25OHVITD2, 25OHVITD3, TESTOFREE, TESTOSTERONE  ?Inflammation (CRP: Acute Phase) (ESR: Chronic Phase) ?No results found for: CRP, ESRSEDRATE, LATICACIDVEN    ?  ? ?Note: Above Lab results reviewed. ? ?Imaging  ?US RENAL ?CLINICAL DATA:  Abnormal finding in urine. ? ?EXAM: ?RENAL / URINARY TRACT ULTRASOUND COMPLETE ? ?COMPARISON:  None. ? ?FINDINGS: ?Right Kidney: ? ?Renal measurements: 10.4 x 5.4 x 4.6 cm = volume: 136 mL. Increased ?cortical echogenicity. ? ?Left Kidney: ? ?Renal measurements: 11.0 x 5.8 x 5.4 cm = volume: 179.3 mL. ?Increased cortical echogenicity. ? ?Bladder: ? ?Appears normal for degree of bladder distention. ? ?Other: ? ?None. ? ?IMPRESSION: ?Increased cortical echogenicity in  the kidneys consistent with ?medical renal disease. No other abnormalities. ? ?Electronically Signed ?  By: Dorise Bullion III M.D. ?  On: 08/04/2021 19:23 ? ?Assessment  ?The primary encounter diagnosis was Failed back surgical syndrome. Diagnoses of Lumbar degenerative disc disease, Lumbar radiculopathy, chronic, Sacroiliac joint dysfunction of right side, SI joint arthritis, and Chronic pain syndrome were also pertinent to this visit. ? ?Plan of Care  ?Problem-specific:  ?No problem-specific Assessment & Plan notes found for this encounter. ? ?Mr. [name] Sr. has a current medication list which includes the following long-term medication(s): amiodarone, furosemide, hydrochlorothiazide, levothyroxine, lisinopril, lisinopril, lovastatin, metoprolol succinate, xarelto, and gabapentin. ? ?Pharmacotherapy (Medications Ordered): ?Meds ordered this encounter  ?Medications  ? gabapentin (NEURONTIN) 300 MG capsule  ?  Sig: Take 1 capsule (300 mg total) by mouth 2 (two) times daily.  ?  Dispense:  60 capsule  ?  Refill:  5  ? ?Orders:  ?No orders of the defined types were placed in this encounter. ? ?Follow-up plan:   ?Return if symptoms worsen or fail to improve.   ?  ?Status post right SI joint injection on 02/20/2020: Helped significantly repeat as needed   ?  ?Recent Visits ?Date Type Provider Dept  ?08/13/21 Office Visit Gillis Santa, MD [facility]  ?Showing recent visits within past 90 days and meeting all other requirements ?Today's Visits ?Date Type Provider Dept  ?09/24/21 Office Visit Gillis Santa, MD [facility]  ?Showing today's visits and meeting all other requirements ?Future  Appointments ?No visits were found meeting these conditions. ?Showing future appointments within next 90 days and meeting all other requirements ? ?I discussed the assessment and treatment plan with the patient. The patient was provided an opportunity to ask questions and all were answered. The patient  agreed with the plan and demonstrated an understanding of the instructions. ? ?Patient advised to call back or seek an in-person evaluation if the symptoms or condition worsens. ? ?Duration of encounter: 20

## 2021-09-27 DIAGNOSIS — I10 Essential (primary) hypertension: Secondary | ICD-10-CM

## 2021-09-27 DIAGNOSIS — I480 Paroxysmal atrial fibrillation: Secondary | ICD-10-CM | POA: Diagnosis not present

## 2021-09-27 DIAGNOSIS — E785 Hyperlipidemia, unspecified: Secondary | ICD-10-CM

## 2021-09-27 DIAGNOSIS — I5040 Unspecified combined systolic (congestive) and diastolic (congestive) heart failure: Secondary | ICD-10-CM | POA: Diagnosis not present

## 2021-10-02 ENCOUNTER — Encounter: Payer: Self-pay | Admitting: Family Medicine

## 2021-10-02 ENCOUNTER — Ambulatory Visit (INDEPENDENT_AMBULATORY_CARE_PROVIDER_SITE_OTHER): Payer: PPO | Admitting: Family Medicine

## 2021-10-02 DIAGNOSIS — J019 Acute sinusitis, unspecified: Secondary | ICD-10-CM

## 2021-10-02 DIAGNOSIS — R0982 Postnasal drip: Secondary | ICD-10-CM

## 2021-10-02 DIAGNOSIS — B9689 Other specified bacterial agents as the cause of diseases classified elsewhere: Secondary | ICD-10-CM | POA: Insufficient documentation

## 2021-10-02 MED ORDER — FLUTICASONE PROPIONATE 50 MCG/ACT NA SUSP: 2.0000 | Freq: Every day | NASAL | 6 refills | Status: DC

## 2021-10-02 MED ORDER — PREDNISONE 10 MG (21) PO TBPK: ORAL_TABLET | ORAL | 0 refills | Status: DC

## 2021-10-02 MED ORDER — AMOXICILLIN-POT CLAVULANATE 875-125 MG PO TABS: 1.0000 | ORAL_TABLET | Freq: Two times a day (BID) | ORAL | 0 refills | Status: DC

## 2021-10-02 NOTE — Progress Notes (Signed)
? ?[employer] as a Education administrator for Gwyneth Sprout, FNP.,have documented all relevant documentation on the behalf of Gwyneth Sprout, FNP,as directed by  Gwyneth Sprout, FNP while in the presence of Gwyneth Sprout, FNP.  ? ?Virtual telephone visit ? ?Virtual Visit via Telephone Note  ? ?This visit type was conducted due to national recommendations for restrictions regarding the COVID-19 Pandemic (e.g. social distancing) in an effort to limit this patient's exposure and mitigate transmission in our community. Due to his co-morbid illnesses, this patient is at least at moderate risk for complications without adequate follow up. This format is felt to be most appropriate for this patient at this time. The patient did not have access to video technology or had technical difficulties with video requiring transitioning to audio format only (telephone). Physical exam was limited to content and character of the telephone converstion.   ? ?Patient location: home ?Provider location:  ?[place] ?[address] ?[place], [place] [address] ? ? ?I discussed the limitations of evaluation and management by telemedicine and the availability of in person appointments. The patient expressed understanding and agreed to proceed.  ? ?Visit Date: 10/02/2021 ? ?Today's healthcare provider: Gwyneth Sprout, FNP  ?Introduced to Designer, jewellery role and practice setting.  All questions answered.  Discussed provider/patient relationship and expectations. ? ? ?Chief Complaint  ?Patient presents with  ? Sore Throat  ? ?Subjective  ?  ?Sore Throat  ?This is a new problem. The current episode started in the past 7 days. The problem has been rapidly worsening. There has been no fever. The fever has been present for Less than 1 day. The pain is at a severity of 10/10. The pain is severe. Associated symptoms include a hoarse voice, swollen glands and trouble swallowing. Pertinent negatives include no abdominal  pain, congestion, coughing, diarrhea, drooling, ear discharge, ear pain, headaches, plugged ear sensation, neck pain, shortness of breath, stridor or vomiting. He has had no exposure to strep or mono. He has tried cool liquids for the symptoms. The treatment provided no relief.   ? ?Medications: ?Outpatient Medications Prior to Visit  ?Medication Sig  ? amiodarone (PACERONE) 200 MG tablet Take 1 tablet (200 mg total) by mouth daily.  ? furosemide (LASIX) 20 MG tablet TAKE 1 TABLET(20 MG) BY MOUTH DAILY AS NEEDED FOR FLUID RETENTION  ? gabapentin (NEURONTIN) 300 MG capsule Take 1 capsule (300 mg total) by mouth 2 (two) times daily.  ? hydrochlorothiazide (MICROZIDE) 12.5 MG capsule Take 1 capsule (12.5 mg total) by mouth daily.  ? levothyroxine (SYNTHROID) 100 MCG tablet TAKE 1 TABLET(100 MCG) BY MOUTH DAILY BEFORE BREAKFAST  ? lisinopril (ZESTRIL) 10 MG tablet Take 1 tablet (10 mg total) by mouth daily.  ? lisinopril (ZESTRIL) 2.5 MG tablet Take 2.5 mg by mouth daily.  ? lovastatin (MEVACOR) 20 MG tablet Take 1 tablet (20 mg total) by mouth daily.  ? metoprolol succinate (TOPROL-XL) 25 MG 24 hr tablet 1 TABLET BY MOUTH ONCE A DAY  ? traMADol (ULTRAM) 50 MG tablet Take 50 mg by mouth 2 (two) times daily as needed.  ? XARELTO 20 MG TABS tablet Take 1 tablet (20 mg total) by mouth daily.  ? [DISCONTINUED] predniSONE (STERAPRED UNI-PAK 21 TAB) 10 MG (21) TBPK tablet Take 10 mg by mouth as directed.  ? ?No facility-administered medications prior to visit.  ? ? ?Review of Systems  ?HENT:  Positive for hoarse voice and trouble swallowing. Negative for  congestion, drooling, ear discharge and ear pain.   ?Respiratory:  Negative for cough, shortness of breath and stridor.   ?Gastrointestinal:  Negative for abdominal pain, diarrhea and vomiting.  ?Musculoskeletal:  Negative for neck pain.  ?Neurological:  Negative for headaches.  ? ? ? ? Objective  ?  ?There were no vitals taken for this visit. ? ? ? ? ? Assessment & Plan  ?   ? ?Problem List Items Addressed This Visit   ? ?  ? Respiratory  ? Acute bacterial sinusitis - Primary  ?  Acute concern within last 7 days ?Associated with sore throat and PND ?Recommend treatment for hoarseness and generalized sinus complaints ?Recommend in office f/u in 2 weeks ?Will refer to ENT if symptoms remain following use of prednisone taper ?Pt denies allergy symptoms; however, recommend use of nasal steroid to assist with inflammation in nasal cavity and to ears ?  ?  ?  ? Other  ? PND (post-nasal drip)  ?  Associated with sinusitis and sore throat and difficulty swallowing ?Recommend repeat of prednisone taper, pt was recently on 2.5 wks ago for his hip ?encourage use with am meal ?Recommend OTC throat spray to assist with pain on swallowing, and encourage cough drops or sugar free candies to assist ?Use of abx for sinusitis with flonase to assist with associated inflammation ?2 wk RTC if symptoms remain/worsen ?  ?  ? ? ? ?Return in about 2 weeks (around 10/16/2021) for sore throat and sinusitis . ?  ? ?I discussed the assessment and treatment plan with the patient. The patient was provided an opportunity to ask questions and all were answered. The patient agreed with the plan and demonstrated an understanding of the instructions. ?  ?The patient was advised to call back or seek an in-person evaluation if the symptoms worsen or if the condition fails to improve as anticipated. ? ?I provided 10 minutes of non-face-to-face time during this encounter. ? ?I, Gwyneth Sprout, FNP, have reviewed all documentation for this visit. The documentation on 10/02/21 for the exam, diagnosis, procedures, and orders are all accurate and complete. ? ?Gwyneth Sprout, FNP ?[place] ?[phone] (phone) ?[phone] (fax) ? ?[facility]  ?

## 2021-10-02 NOTE — Assessment & Plan Note (Signed)
Associated with sinusitis and sore throat and difficulty swallowing ?Recommend repeat of prednisone taper, pt was recently on 2.5 wks ago for his hip ?encourage use with am meal ?Recommend OTC throat spray to assist with pain on swallowing, and encourage cough drops or sugar free candies to assist ?Use of abx for sinusitis with flonase to assist with associated inflammation ?2 wk RTC if symptoms remain/worsen ?

## 2021-10-02 NOTE — Assessment & Plan Note (Signed)
Acute concern within last 7 days ?Associated with sore throat and PND ?Recommend treatment for hoarseness and generalized sinus complaints ?Recommend in office f/u in 2 weeks ?Will refer to ENT if symptoms remain following use of prednisone taper ?Pt denies allergy symptoms; however, recommend use of nasal steroid to assist with inflammation in nasal cavity and to ears ?

## 2021-10-11 DIAGNOSIS — M5126 Other intervertebral disc displacement, lumbar region: Secondary | ICD-10-CM | POA: Diagnosis not present

## 2021-10-11 DIAGNOSIS — M5136 Other intervertebral disc degeneration, lumbar region: Secondary | ICD-10-CM | POA: Diagnosis not present

## 2021-10-11 DIAGNOSIS — M5416 Radiculopathy, lumbar region: Secondary | ICD-10-CM | POA: Diagnosis not present

## 2021-10-17 DIAGNOSIS — I509 Heart failure, unspecified: Secondary | ICD-10-CM | POA: Diagnosis not present

## 2021-10-17 DIAGNOSIS — E785 Hyperlipidemia, unspecified: Secondary | ICD-10-CM | POA: Diagnosis not present

## 2021-10-17 DIAGNOSIS — I4821 Permanent atrial fibrillation: Secondary | ICD-10-CM | POA: Diagnosis not present

## 2021-10-17 DIAGNOSIS — I10 Essential (primary) hypertension: Secondary | ICD-10-CM | POA: Diagnosis not present

## 2021-10-17 DIAGNOSIS — N1832 Chronic kidney disease, stage 3b: Secondary | ICD-10-CM | POA: Diagnosis not present

## 2021-10-17 DIAGNOSIS — R82998 Other abnormal findings in urine: Secondary | ICD-10-CM | POA: Diagnosis not present

## 2021-10-17 DIAGNOSIS — G8918 Other acute postprocedural pain: Secondary | ICD-10-CM | POA: Diagnosis not present

## 2021-10-17 DIAGNOSIS — R609 Edema, unspecified: Secondary | ICD-10-CM | POA: Diagnosis not present

## 2021-10-28 ENCOUNTER — Telehealth: Payer: Self-pay | Admitting: Family Medicine

## 2021-10-28 ENCOUNTER — Other Ambulatory Visit: Payer: Self-pay

## 2021-10-28 ENCOUNTER — Emergency Department
Admission: EM | Admit: 2021-10-28 | Discharge: 2021-10-28 | Disposition: A | Discharge status: Home / Self Care | Payer: PPO | Attending: Emergency Medicine | Admitting: Emergency Medicine

## 2021-10-28 ENCOUNTER — Ambulatory Visit: Payer: Self-pay | Admitting: *Deleted

## 2021-10-28 ENCOUNTER — Emergency Department: Payer: PPO

## 2021-10-28 DIAGNOSIS — J384 Edema of larynx: Secondary | ICD-10-CM | POA: Diagnosis not present

## 2021-10-28 DIAGNOSIS — I10 Essential (primary) hypertension: Secondary | ICD-10-CM | POA: Diagnosis not present

## 2021-10-28 DIAGNOSIS — Z7901 Long term (current) use of anticoagulants: Secondary | ICD-10-CM | POA: Insufficient documentation

## 2021-10-28 DIAGNOSIS — J392 Other diseases of pharynx: Secondary | ICD-10-CM | POA: Diagnosis not present

## 2021-10-28 DIAGNOSIS — J029 Acute pharyngitis, unspecified: Secondary | ICD-10-CM | POA: Diagnosis present

## 2021-10-28 DIAGNOSIS — J36 Peritonsillar abscess: Secondary | ICD-10-CM | POA: Insufficient documentation

## 2021-10-28 DIAGNOSIS — I4891 Unspecified atrial fibrillation: Secondary | ICD-10-CM | POA: Insufficient documentation

## 2021-10-28 DIAGNOSIS — I6529 Occlusion and stenosis of unspecified carotid artery: Secondary | ICD-10-CM | POA: Diagnosis not present

## 2021-10-28 DIAGNOSIS — J351 Hypertrophy of tonsils: Secondary | ICD-10-CM | POA: Diagnosis not present

## 2021-10-28 LAB — COMPREHENSIVE METABOLIC PANEL
ALT: 36 U/L (ref 0–44)
AST: 21 U/L (ref 15–41)
Albumin: 3.7 g/dL (ref 3.5–5.0)
Alkaline Phosphatase: 127 U/L — ABNORMAL HIGH (ref 38–126)
Anion gap: 7 (ref 5–15)
BUN: 28 mg/dL — ABNORMAL HIGH (ref 8–23)
CO2: 30 mmol/L (ref 22–32)
Calcium: 9 mg/dL (ref 8.9–10.3)
Chloride: 99 mmol/L (ref 98–111)
Creatinine, Ser: 1.47 mg/dL — ABNORMAL HIGH (ref 0.61–1.24)
GFR, Estimated: 48 mL/min — ABNORMAL LOW (ref >60)
Glucose, Bld: 107 mg/dL — ABNORMAL HIGH (ref 70–99)
Potassium: 4.4 mmol/L (ref 3.5–5.1)
Sodium: 136 mmol/L (ref 135–145)
Total Bilirubin: 1.5 mg/dL — ABNORMAL HIGH (ref 0.3–1.2)
Total Protein: 7 g/dL (ref 6.5–8.1)

## 2021-10-28 LAB — CBC WITH DIFFERENTIAL/PLATELET
Abs Immature Granulocytes: 0.5 10*3/uL — ABNORMAL HIGH (ref 0.00–0.07)
Basophils Absolute: 0.1 10*3/uL (ref 0.0–0.1)
Basophils Relative: 1 %
Eosinophils Absolute: 0.2 10*3/uL (ref 0.0–0.5)
Eosinophils Relative: 2 %
HCT: 41.8 % (ref 39.0–52.0)
Hemoglobin: 13.6 g/dL (ref 13.0–17.0)
Immature Granulocytes: 5 %
Lymphocytes Relative: 9 %
Lymphs Abs: 0.9 10*3/uL (ref 0.7–4.0)
MCH: 31 pg (ref 26.0–34.0)
MCHC: 32.5 g/dL (ref 30.0–36.0)
MCV: 95.2 fL (ref 80.0–100.0)
Monocytes Absolute: 1 10*3/uL (ref 0.1–1.0)
Monocytes Relative: 10 %
Neutro Abs: 7.4 10*3/uL (ref 1.7–7.7)
Neutrophils Relative %: 73 %
Platelets: 250 10*3/uL (ref 150–400)
RBC: 4.39 MIL/uL (ref 4.22–5.81)
RDW: 14.7 % (ref 11.5–15.5)
WBC: 10 10*3/uL (ref 4.0–10.5)
nRBC: 0 % (ref 0.0–0.2)

## 2021-10-28 LAB — GROUP A STREP BY PCR: Group A Strep by PCR: NOT DETECTED

## 2021-10-28 MED ORDER — CEFTRIAXONE SODIUM 1 G IJ SOLR
2.0000 g | Freq: Once | INTRAMUSCULAR | Status: AC
  Administered 2021-10-28: 2 g via INTRAMUSCULAR
  Filled 2021-10-28: qty 20

## 2021-10-28 MED ORDER — METHYLPREDNISOLONE ACETATE 80 MG/ML IJ SUSP
80.0000 mg | Freq: Once | INTRAMUSCULAR | Status: AC
  Administered 2021-10-28: 80 mg via INTRAMUSCULAR
  Filled 2021-10-28: qty 1

## 2021-10-28 MED ORDER — AMOXICILLIN-POT CLAVULANATE 875-125 MG PO TABS: 1.0000 | ORAL_TABLET | Freq: Two times a day (BID) | ORAL | 0 refills | Status: AC

## 2021-10-28 MED ORDER — IOHEXOL 300 MG/ML  SOLN
80.0000 mL | Freq: Once | INTRAMUSCULAR | Status: AC | PRN
  Administered 2021-10-28: 80 mL via INTRAVENOUS
  Filled 2021-10-28: qty 80

## 2021-10-28 MED ORDER — SODIUM CHLORIDE 0.9 % IV SOLN
3.0000 g | Freq: Once | INTRAVENOUS | Status: AC
  Administered 2021-10-28: 3 g via INTRAVENOUS
  Filled 2021-10-28: qty 8

## 2021-10-28 MED ORDER — DEXAMETHASONE SODIUM PHOSPHATE 10 MG/ML IJ SOLN
10.0000 mg | Freq: Once | INTRAMUSCULAR | Status: AC
  Administered 2021-10-28: 10 mg via INTRAVENOUS
  Filled 2021-10-28: qty 1

## 2021-10-28 MED ORDER — LIDOCAINE-EPINEPHRINE (PF) 2 %-1:200000 IJ SOLN
INTRAMUSCULAR | Status: AC
  Filled 2021-10-28: qty 20

## 2021-10-28 MED ORDER — MORPHINE SULFATE (PF) 4 MG/ML IV SOLN
4.0000 mg | Freq: Once | INTRAVENOUS | Status: AC
  Administered 2021-10-28: 4 mg via INTRAVENOUS
  Filled 2021-10-28: qty 1

## 2021-10-28 MED ORDER — PREDNISONE 10 MG (21) PO TBPK: ORAL_TABLET | ORAL | 0 refills | Status: AC

## 2021-10-28 NOTE — Consult Note (Signed)
[name], [place] ?[id number] ?02/25/1941 ?Lucrezia Starch, MD ? ?Reason for Consult: Peritonsillar abscess ? ?HPI: 81 year old gentleman with a 3-day history of sore throat progressively worsening presented to the emergency room with more difficulty swallowing.  CT scan revealed a right peritonsillar abscess.  Does have a history of peritonsillar abscess in the past which by report was drained with aspiration.  He is on Xarelto for atrial fibrillation.  He is handling his secretions and is alert and awake ? ?Allergies: No Known Allergies ? ?ROS: Review of systems normal other than 12 systems except per HPI. ? ?PMH:  ?Past Medical History:  ?Diagnosis Date  ? Allergy   ? History of chicken pox   ? Hyperlipidemia   ? Hypertension   ? ? ?FH:  ?Family History  ?Problem Relation Age of Onset  ? Alzheimer's disease Mother   ? Heart attack Father   ? Colon cancer Brother   ? ? ?SH:  ?Social History  ? ?Socioeconomic History  ? Marital status: Widowed  ?  Spouse name: Not on file  ? Number of children: 2  ? Years of education: Not on file  ? Highest education level: 12th grade  ?Occupational History  ? Occupation: Retired/ Disabled  ?Tobacco Use  ? Smoking status: Former  ?  Packs/day: 2.00  ?  Years: 50.00  ?  Pack years: 100.00  ?  Types: Cigarettes  ?  Quit date: 07/01/2007  ?  Years since quitting: 14.3  ? Smokeless tobacco: Never  ?Vaping Use  ? Vaping Use: Never used  ?Substance and Sexual Activity  ? Alcohol use: No  ?  Alcohol/week: 0.0 standard drinks  ? Drug use: No  ? Sexual activity: Not on file  ?Other Topics Concern  ? Not on file  ?Social History Narrative  ? Not on file  ? ?Social Determinants of Health  ? ?Financial Resource Strain: Low Risk   ? Difficulty of Paying Living Expenses: Not hard at all  ?Food Insecurity: No Food Insecurity  ? Worried About Charity fundraiser in the Last Year: Never true  ? Ran Out of Food in the Last Year: Never true  ?Transportation Needs: No Transportation Needs  ? Lack of  Transportation (Medical): No  ? Lack of Transportation (Non-Medical): No  ?Physical Activity: Inactive  ? Days of Exercise per Week: 0 days  ? Minutes of Exercise per Session: 0 min  ?Stress: No Stress Concern Present  ? Feeling of Stress : Not at all  ?Social Connections: Socially Isolated  ? Frequency of Communication with Friends and Family: More than three times a week  ? Frequency of Social Gatherings with Friends and Family: Once a week  ? Attends Religious Services: Never  ? Active Member of Clubs or Organizations: No  ? Attends Archivist Meetings: Never  ? Marital Status: Widowed  ?Intimate Partner Violence: Not At Risk  ? Fear of Current or Ex-Partner: No  ? Emotionally Abused: No  ? Physically Abused: No  ? Sexually Abused: No  ? ? ?PSH:  ?Past Surgical History:  ?Procedure Laterality Date  ? BACK SURGERY    ? x5  ? DOPPLER ECHOCARDIOGRAPHY  06/21/2013  ? EF=35% while in Sinus Rhythm  ? Myocardial perfusion scan  10/2012  ? severe global LV enlargement. Mild RV enlargement. Mild LVH. Mild mitral and tricuspid insufficency  ? polyp excision  09/2005  ? UPPER GASTROINTESTINAL ENDOSCOPY  03/27/2004  ? Gastropathy, Gastritis; Duodenopathy  ? ? ?Physical  Exam: Patient awake and alert cranial nerves II through XII grossly intact external nose appeared normal anterior nose benign.  Oral cavity oropharynx showed normal tongue and floor mouth obvious right peritonsillar abscess with swelling of the soft palate.  Review of CT confirms findings ? ?Procedure: Needle aspiration of peritonsillar abscess-after patient's consent a local anesthetic of 1% lidocaine with 1 1000 units of epinephrine was used to inject along the right soft palate a total of 1-1/2 cc was used.  After approximately 4-minute a 10 cc syringe with an 18-gauge needle was passed into the soft tissues of the unilateral right tonsillar swelling.  A pus pocket was obtained and aspirated.  Approximately 3 to 4 cc of pus was drained from the  abscess cavity.  This immediately improved his symptoms and decreased swelling. ? ?A/P: Peritonsillar abscess patient received IV Unasyn and Decadron while in the emergency room would recommend 2 g of IM Rocephin and 80 of Depo-Medrol prior to discharge and discharged home on Augmentin 875 1 p.o. twice daily for 10 days and a 6-day double strength Sterapred taper I have given him my card and I will follow-up with him in 2 days he will call tomorrow if he is not significantly improved. ? ? ?Roena Malady ?10/28/2021 ?7:30 PM ? ? ?  ?

## 2021-10-28 NOTE — Telephone Encounter (Signed)
Attempted to call patient to discuss need for RF on antibiotic as this is not normally refilled without follow up- no answer and no way to leave message. ?

## 2021-10-28 NOTE — ED Notes (Signed)
E-signature not working at this time. Pt verbalized understanding of D/C instructions, prescriptions and follow up care with no further questions at this time. Pt in NAD and ambulatory at time of D/C.  

## 2021-10-28 NOTE — Telephone Encounter (Signed)
Medication Refill - Medication:  ? ?amoxicillin-clavulanate (AUGMENTIN) 875-125 MG tablet ? ?Has the patient contacted their pharmacy? No. No refills left.  ?(Agent: If no, request that the patient contact the pharmacy for the refill. If patient does not wish to contact the pharmacy document the reason why and proceed with request.) ?(Agent: If yes, when and what did the pharmacy advise?) ? ?Preferred Pharmacy (with phone number or street name):  ? ?[facility] [place], [address] AT [facility] [address]  ?[place] [address]  ?Phone: [phone] Fax: [phone]  ? ? ? ?Has the patient been seen for an appointment in the last year OR does the patient have an upcoming appointment? Yes.   ? ?Agent: Please be advised that RX refills may take up to 3 business days. We ask that you follow-up with your pharmacy. ? ?

## 2021-10-28 NOTE — ED Triage Notes (Signed)
Pt c/o right sided throat pain for the past 3 days states he has a hx of tonsillar abscess and this feels the same. ?

## 2021-10-28 NOTE — Telephone Encounter (Signed)
Contacted patient and advised and he states that he will go to [place] ?

## 2021-10-28 NOTE — Telephone Encounter (Signed)
Per agent: ?"Pt was seen on 4/5 for a sore throat, pt called in stating he is still having symptoms and feels like he needs another round of antibiotics, pt was offered to schedule an appt and declined. Pt needed advice." ? ? ?Chief Complaint: Sore throat ?Symptoms: Swelling of tonsils,pus noted. Seen 10/02/21, sore throat, completed course of antibiotics and prednisone. Also recently completed course of prednisone for arthritis, [facility]. Pt states can eat soft foods only. States is staying hydrated, "Sips of water only." ?Frequency: 3 days ago. "I have a history of bad tonsils, had this before pretty bad."  ?Pertinent Negatives: Patient denies fever, dizziness, no S/S dehydration. ?Disposition: [] ED /[] Urgent Care (no appt availability in office) / [x] Appointment(In office/virtual)/ []  [facility]/ [] Home Care/ [] Refused Recommended Disposition /[] [facility]/ []  Follow-up with PCP ?Additional Notes: Pt declines appt. Requesting another course of antibiotics be called in. Advised may need appt but assured pt NT would route to practice for PCPs review. Pt states cannot get to appt due to arthritis pain. Advised ED for any worsening symptoms. Pt verbalizes understanding.  ?  ? ? ? ? ? ?Reason for Disposition ? SEVERE (e.g., excruciating) throat pain ? ?Answer Assessment - Initial Assessment Questions ?1. ONSET: "When did the throat start hurting?" (Hours or days ago)  ?    3 days ago ?2. SEVERITY: "How bad is the sore throat?" (Scale 1-10; mild, moderate or severe) ?  - MILD (1-3):  doesn't interfere with eating or normal activities ?  - MODERATE (4-7): interferes with eating some solids and normal activities ?  - SEVERE (8-10):  excruciating pain, interferes with most normal activities ?  - SEVERE DYSPHAGIA: can't swallow liquids, drooling ?    10/10 ?3. STREP EXPOSURE: "Has there been any exposure to strep within the past week?" If Yes, ask: "What type of contact occurred?"  ?     ?4.   VIRAL SYMPTOMS: "Are there any symptoms of a cold, such as a runny nose, cough, hoarse voice or red eyes?"  ?    No ?5. FEVER: "Do you have a fever?" If Yes, ask: "What is your temperature, how was it measured, and when did it start?" ?    No ?6. PUS ON THE TONSILS: "Is there pus on the tonsils in the back of your throat?" ?    Pus on tonsils, red and swollen ?7. OTHER SYMPTOMS: "Do you have any other symptoms?" (e.g., difficulty breathing, headache, rash) ?    Swelling of throat. Cannot swallow ? ?Protocols used: Sore Throat-A-AH ? ?

## 2021-10-28 NOTE — ED Provider Notes (Signed)
? ?[facility] ?Provider Note ? ? ? Event Date/Time  ? First MD Initiated Contact with Patient 10/28/21 1650   ?  (approximate) ? ? ?History  ? ?Sore Throat ? ? ?HPI ? ?[name] Friends Sr. is a 81 y.o. male with a past medical history of HDL, HTN, and A-fib on Xarelto and amiodarone as well as previous PTAs requiring drainage by ENT who presents for evaluation of 3 days of sore throat and some swelling and noticed on the right side of his mouth and the right tonsil area.  No fevers, earache, other headache, neck pain, shortness of breath, chest pain, cough, shortness of breath, abdominal pain, nausea, vomiting diarrhea rash or any other acute sick symptoms.  No medications prior to arrival.  No trauma. ? ?  ? ? ?Physical Exam  ?Triage Vital Signs: ?ED Triage Vitals  ?Enc Vitals Group  ?   BP 10/28/21 1621 127/61  ?   Pulse Rate 10/28/21 1621 (!) 57  ?   Resp 10/28/21 1621 18  ?   Temp 10/28/21 1621 (!) 97.5 ?F (36.4 ?C)  ?   Temp Source 10/28/21 1621 Oral  ?   SpO2 10/28/21 1621 99 %  ?   Weight 10/28/21 1622 249 lb (112.9 kg)  ?   Height 10/28/21 1622 6\' 2"  (1.88 m)  ?   Head Circumference --   ?   Peak Flow --   ?   Pain Score 10/28/21 1622 10  ?   Pain Loc --   ?   Pain Edu? --   ?   Excl. in [place]? --   ? ? ?Most recent vital signs: ?Vitals:  ? 10/28/21 1621  ?BP: 127/61  ?Pulse: (!) 57  ?Resp: 18  ?Temp: (!) 97.5 ?F (36.4 ?C)  ?SpO2: 99%  ? ? ?General: Awake, no distress.  ?CV:  Good peripheral perfusion.  2+ radial pulse.  Slightly bradycardic. ?Resp:  Normal effort.  Bilaterally.  No stridor over the neck. ?Abd:  No distention.  Soft. ?Other:  History of 12 grossly intact.  Right tonsillar area significant large with some slight deviation of uvula to the left.  Left tonsil is unremarkable.  I am able to visualize posterior oropharynx which is grossly unremarkable.  No trismus.  No induration of the tongue.  Noted cervical lymph adenopathy.  Otherwise face and neck are unremarkable. ? ? ?ED  Results / Procedures / Treatments  ?Labs ?(all labs ordered are listed, but only abnormal results are displayed) ?Labs Reviewed  ?CBC WITH DIFFERENTIAL/PLATELET - Abnormal; Notable for the following components:  ?    Result Value  ? Abs Immature Granulocytes 0.50 (*)   ? All other components within normal limits  ?COMPREHENSIVE METABOLIC PANEL - Abnormal; Notable for the following components:  ? Glucose, Bld 107 (*)   ? BUN 28 (*)   ? Creatinine, Ser 1.47 (*)   ? Alkaline Phosphatase 127 (*)   ? Total Bilirubin 1.5 (*)   ? GFR, Estimated 48 (*)   ? All other components within normal limits  ?GROUP A STREP BY PCR  ? ? ? ?EKG ? ? ? ?RADIOLOGY ? ?CT neck, review shows a 2.5 cm right tonsillar abscess without other acute process.  I also viewed radiology interpretation and agree to findings of some emphysema. ? ? ?PROCEDURES: ? ?Critical Care performed: No ? ?Procedures ? ? ? ?MEDICATIONS ORDERED IN ED: ?Medications  ?cefTRIAXone (ROCEPHIN) injection 2 g (has no administration in  time range)  ?lidocaine-EPINEPHrine (XYLOCAINE W/EPI) 2 %-1:200000 (PF) injection (has no administration in time range)  ?methylPREDNISolone acetate (DEPO-MEDROL) injection 80 mg (has no administration in time range)  ?Ampicillin-Sulbactam (UNASYN) 3 g in sodium chloride 0.9 % 100 mL IVPB (3 g Intravenous New Bag/Given 10/28/21 1737)  ?dexamethasone (DECADRON) injection 10 mg (10 mg Intravenous Given 10/28/21 1737)  ?morphine (PF) 4 MG/ML injection 4 mg (4 mg Intravenous Given 10/28/21 1739)  ?iohexol (OMNIPAQUE) 300 MG/ML solution 80 mL (80 mLs Intravenous Contrast Given 10/28/21 1753)  ? ? ? ?IMPRESSION / MDM / ASSESSMENT AND PLAN / ED COURSE  ?I reviewed the triage vital signs and the nursing notes. ?             ?               ? ?Differential diagnosis includes, but is not limited to dural versus strep pharyngitis, PTA versus tonsillitis.  He is not seem to have any emergent findings on his exam to suggest acute respiratory failure or imminent  airway obstruction.  He does not appear septic.  No findings to suggest a Ludewig's angina or retropharyngeal abscess as he does have full range of motion of his neck without significant pain. ? ?Rapid strep screen is negative.  CMP shows no significant joint or metabolic derangements.  Kidney function of 1.47 compared to creatinine of 1.7 from 05/15/2021.  CBC without leukocytosis or acute anemia and patient does not otherwise appear septic ? ?CT neck, review shows a 2.5 cm right tonsillar abscess without other acute process.  I also viewed radiology interpretation and agree to findings of some emphysema. ? ?Concern for tonsillar abscess I consulted with on-call ENT physician Dr. Tami Ribas.  ? ?PTA drained at bedside by Dr. Tami Ribas.  He recommends an additional 2 g of IM Rocephin as well as IM steroids and a prescription for Augmentin and Sterapred taper.  These were written.  Patient will follow up with ENT outpatient.  Discharged in stable condition. ? ?  ? ? ?FINAL CLINICAL IMPRESSION(S) / ED DIAGNOSES  ? ?Final diagnoses:  ?Peritonsillar abscess  ? ? ? ?Rx / DC Orders  ? ?ED Discharge Orders   ? ?      Ordered  ?  amoxicillin-clavulanate (AUGMENTIN) 875-125 MG tablet  2 times daily       ? 10/28/21 1942  ?  predniSONE (STERAPRED UNI-PAK 21 TAB) 10 MG (21) TBPK tablet       ? 10/28/21 1944  ? ?  ?  ? ?  ? ? ? ?Note:  This document was prepared using Dragon voice recognition software and may include unintentional dictation errors. ?  ?Lucrezia Starch, MD ?10/28/21 1945 ? ?

## 2021-10-29 NOTE — Telephone Encounter (Signed)
See triage encounter 10/28/21, also patient seen in UC. ?

## 2021-11-18 DIAGNOSIS — L03115 Cellulitis of right lower limb: Secondary | ICD-10-CM | POA: Diagnosis not present

## 2021-11-18 DIAGNOSIS — I5022 Chronic systolic (congestive) heart failure: Secondary | ICD-10-CM | POA: Diagnosis not present

## 2021-11-21 ENCOUNTER — Telehealth: Payer: Self-pay

## 2021-11-21 NOTE — Telephone Encounter (Signed)
Copied from [place] [phone]. Topic: General - Inquiry >> Nov 21, 2021  8:20 AM Valere Dross wrote: Reason for CRM: Pt called in stating he was recently seen at the [facility] clinic for a open sore on his leg, they advise him to go to a wound care, pt states that he wanted to speak with Dr. Caryn Section or PCP about what he should do. Pt requested a call back, please advise.

## 2021-11-26 ENCOUNTER — Emergency Department
Admission: EM | Admit: 2021-11-26 | Discharge: 2021-11-26 | Disposition: A | Discharge status: Home / Self Care | Payer: PPO | Attending: Emergency Medicine | Admitting: Emergency Medicine

## 2021-11-26 ENCOUNTER — Emergency Department: Payer: PPO

## 2021-11-26 ENCOUNTER — Other Ambulatory Visit: Payer: Self-pay

## 2021-11-26 DIAGNOSIS — M7989 Other specified soft tissue disorders: Secondary | ICD-10-CM | POA: Diagnosis not present

## 2021-11-26 DIAGNOSIS — I10 Essential (primary) hypertension: Secondary | ICD-10-CM | POA: Diagnosis not present

## 2021-11-26 DIAGNOSIS — R000 Tachycardia, unspecified: Secondary | ICD-10-CM | POA: Insufficient documentation

## 2021-11-26 DIAGNOSIS — R600 Localized edema: Secondary | ICD-10-CM | POA: Diagnosis not present

## 2021-11-26 DIAGNOSIS — M79604 Pain in right leg: Secondary | ICD-10-CM | POA: Diagnosis not present

## 2021-11-26 DIAGNOSIS — S81801A Unspecified open wound, right lower leg, initial encounter: Secondary | ICD-10-CM | POA: Diagnosis not present

## 2021-11-26 DIAGNOSIS — M79605 Pain in left leg: Secondary | ICD-10-CM | POA: Diagnosis not present

## 2021-11-26 DIAGNOSIS — R609 Edema, unspecified: Secondary | ICD-10-CM

## 2021-11-26 LAB — CBC WITH DIFFERENTIAL/PLATELET
Abs Immature Granulocytes: 0.37 10*3/uL — ABNORMAL HIGH (ref 0.00–0.07)
Basophils Absolute: 0.1 10*3/uL (ref 0.0–0.1)
Basophils Relative: 1 %
Eosinophils Absolute: 0.1 10*3/uL (ref 0.0–0.5)
Eosinophils Relative: 2 %
HCT: 37.8 % — ABNORMAL LOW (ref 39.0–52.0)
Hemoglobin: 12.4 g/dL — ABNORMAL LOW (ref 13.0–17.0)
Immature Granulocytes: 4 %
Lymphocytes Relative: 11 %
Lymphs Abs: 1 10*3/uL (ref 0.7–4.0)
MCH: 30 pg (ref 26.0–34.0)
MCHC: 32.8 g/dL (ref 30.0–36.0)
MCV: 91.5 fL (ref 80.0–100.0)
Monocytes Absolute: 0.9 10*3/uL (ref 0.1–1.0)
Monocytes Relative: 10 %
Neutro Abs: 6.4 10*3/uL (ref 1.7–7.7)
Neutrophils Relative %: 72 %
Platelets: 275 10*3/uL (ref 150–400)
RBC: 4.13 MIL/uL — ABNORMAL LOW (ref 4.22–5.81)
RDW: 14.6 % (ref 11.5–15.5)
WBC: 8.8 10*3/uL (ref 4.0–10.5)
nRBC: 0 % (ref 0.0–0.2)

## 2021-11-26 LAB — COMPREHENSIVE METABOLIC PANEL
ALT: 22 U/L (ref 0–44)
AST: 24 U/L (ref 15–41)
Albumin: 3.5 g/dL (ref 3.5–5.0)
Alkaline Phosphatase: 128 U/L — ABNORMAL HIGH (ref 38–126)
Anion gap: 11 (ref 5–15)
BUN: 32 mg/dL — ABNORMAL HIGH (ref 8–23)
CO2: 26 mmol/L (ref 22–32)
Calcium: 9.3 mg/dL (ref 8.9–10.3)
Chloride: 98 mmol/L (ref 98–111)
Creatinine, Ser: 1.55 mg/dL — ABNORMAL HIGH (ref 0.61–1.24)
GFR, Estimated: 45 mL/min — ABNORMAL LOW (ref >60)
Glucose, Bld: 108 mg/dL — ABNORMAL HIGH (ref 70–99)
Potassium: 3.5 mmol/L (ref 3.5–5.1)
Sodium: 135 mmol/L (ref 135–145)
Total Bilirubin: 1.2 mg/dL (ref 0.3–1.2)
Total Protein: 7 g/dL (ref 6.5–8.1)

## 2021-11-26 LAB — URINALYSIS, ROUTINE W REFLEX MICROSCOPIC
Bilirubin Urine: NEGATIVE
Glucose, UA: NEGATIVE mg/dL
Hgb urine dipstick: NEGATIVE
Ketones, ur: NEGATIVE mg/dL
Leukocytes,Ua: NEGATIVE
Nitrite: NEGATIVE
Protein, ur: NEGATIVE mg/dL
Specific Gravity, Urine: 1.016 (ref 1.005–1.030)
pH: 6 (ref 5.0–8.0)

## 2021-11-26 LAB — PROTIME-INR
INR: 1.4 — ABNORMAL HIGH (ref 0.8–1.2)
Prothrombin Time: 17.4 seconds — ABNORMAL HIGH (ref 11.4–15.2)

## 2021-11-26 LAB — LACTIC ACID, PLASMA: Lactic Acid, Venous: 1.8 mmol/L (ref 0.5–1.9)

## 2021-11-26 MED ORDER — FUROSEMIDE 10 MG/ML IJ SOLN
40.0000 mg | Freq: Once | INTRAMUSCULAR | Status: AC
  Administered 2021-11-26: 40 mg via INTRAVENOUS
  Filled 2021-11-26: qty 4

## 2021-11-26 NOTE — ED Provider Notes (Signed)
[facility] Provider Note    Event Date/Time   First MD Initiated Contact with Patient 11/26/21 1126     (approximate)   History   Leg Swelling   HPI  [name] WELCHEL Sr. is a 81 y.o. male with a history of hyperlipidemia, hypertension, and atrial fibrillation who presents with bilateral leg swelling which has been ongoing for months, but worse in the last few weeks.  He states he has been on and off of Lasix before as needed.  He states he had a scratch to his right lower leg about a month ago and the wound was weeping and red so he saw his doctor last week and was diagnosed with likely cellulitis and treated with doxycycline.  He states that the redness has resolved, but the swelling remains.  He denies associated shortness of breath, fever, pain going up the leg, or other acute symptoms.   Physical Exam   Triage Vital Signs: ED Triage Vitals  Enc Vitals Group     BP 11/26/21 1028 (!) 103/59     Pulse Rate 11/26/21 1028 (!) 114     Resp 11/26/21 1028 18     Temp 11/26/21 1028 97.6 F (36.4 C)     Temp Source 11/26/21 1028 Oral     SpO2 11/26/21 1028 96 %     Weight 11/26/21 1029 248 lb 14.4 oz (112.9 kg)     Height 11/26/21 1029 6\' 2"  (1.88 m)     Head Circumference --      Peak Flow --      Pain Score 11/26/21 1029 0     Pain Loc --      Pain Edu? --      Excl. in [place]? --     Most recent vital signs: Vitals:   11/26/21 1028 11/26/21 1325  BP: (!) 103/59 (!) 121/59  Pulse: (!) 114 61  Resp: 18 16  Temp: 97.6 F (36.4 C) 97.7 F (36.5 C)  SpO2: 96% 100%     General: Alert, comfortable appearing. CV:  Good peripheral perfusion.  Resp:  Normal effort.  Abd:  No distention.  Other:  Bilateral 3+ pitting edema.  2+ DP pulses. Cap refill less than 2 seconds.  Very faint erythema to the bilateral anterior lower legs with no abnormal warmth or induration.   ED Results / Procedures / Treatments   Labs (all labs ordered are listed, but  only abnormal results are displayed) Labs Reviewed  COMPREHENSIVE METABOLIC PANEL - Abnormal; Notable for the following components:      Result Value   Glucose, Bld 108 (*)    BUN 32 (*)    Creatinine, Ser 1.55 (*)    Alkaline Phosphatase 128 (*)    GFR, Estimated 45 (*)    All other components within normal limits  CBC WITH DIFFERENTIAL/PLATELET - Abnormal; Notable for the following components:   RBC 4.13 (*)    Hemoglobin 12.4 (*)    HCT 37.8 (*)    Abs Immature Granulocytes 0.37 (*)    All other components within normal limits  PROTIME-INR - Abnormal; Notable for the following components:   Prothrombin Time 17.4 (*)    INR 1.4 (*)    All other components within normal limits  URINALYSIS, ROUTINE W REFLEX MICROSCOPIC - Abnormal; Notable for the following components:   Color, Urine YELLOW (*)    APPearance CLEAR (*)    All other components within normal limits  CULTURE, BLOOD (  ROUTINE X 2)  CULTURE, BLOOD (ROUTINE X 2)  LACTIC ACID, PLASMA     EKG     RADIOLOGY  Chest x-ray: I independently viewed and interpreted the images; there is no evidence of edema  XR R tibia/fibula: No fracture or other acute abnormality  US venous LE bilateral: No acute DVT  PROCEDURES:  Critical Care performed: No  Procedures   MEDICATIONS ORDERED IN ED: Medications  furosemide (LASIX) injection 40 mg (40 mg Intravenous Given 11/26/21 1319)     IMPRESSION / MDM / ASSESSMENT AND PLAN / ED COURSE  I reviewed the triage vital signs and the nursing notes.  81 year old male with PMH as noted above presents with bilateral lower extremity swelling with some increased redness over the last few weeks.  He was treated for possible cellulitis of the right lower extremity last week and states that the redness has improved but the swelling remains to both legs.  I reviewed the past medical records and confirmed outpatient visit from 11/18/2021 at which time the patient was started on  doxycycline.  On exam the patient is well-appearing.  He was tachycardic at triage but this resolved spontaneously and his heart rate was in the 60s during my exam.  Other vital signs are normal.  He has significant bilateral lower extremity edema which appears mostly chronic.  There is faint erythema but no induration or abnormal warmth, and he has good distal pulses.  Lab work-up reveals normal WBC count and lactate.  The patient is afebrile.  Differential diagnosis includes, but is not limited to, dependent edema, venous stasis, new onset CHF, DVT, cellulitis.  I do not suspect cellulitis given the reassuring labs, lack of fever, and improved redness.  The chest x-ray is clear and there is no clinical evidence for CHF.  Patient's presentation is most consistent with acute presentation with potential threat to life or bodily function.  We will obtain an ultrasound of the lower extremities and give a dose of IV Lasix here.  ----------------------------------------- 4:29 PM on 11/26/2021 -----------------------------------------  As mentioned above, labs are unremarkable.  Ultrasound is negative for DVT.  I did consider whether the patient might require admission, however given that there is no clinical evidence for cellulitis this does not appear to be a failure of outpatient management.  The patient himself feels well and would strongly prefer to go home.  He is stable for discharge at this time.  I counseled him on the results of the work-up.  In addition to the dose of Lasix given here I recommend that he increase his Lasix to 20 mg twice daily for the next week and then follow-up with his doctor.  He agrees with this plan.  Return precautions given, the patient expresses understanding.    FINAL CLINICAL IMPRESSION(S) / ED DIAGNOSES   Final diagnoses:  Peripheral edema     Rx / DC Orders   ED Discharge Orders     None        Note:  This document was prepared using  Dragon voice recognition software and may include unintentional dictation errors.    Arta Silence, MD 11/26/21 1630

## 2021-11-26 NOTE — Discharge Instructions (Signed)
Your ultrasound does not show any signs of a DVT (blood clot).  At this time there are also no signs of cellulitis or infection in your lower legs.  Keep the legs elevated.  Take a double dose of your Lasix for the next week (20 mg in the morning and then 20 mg again in the evening).  Follow-up with your regular doctor within the next week.  Return to the ER for new, worsening, persistent severe swelling, redness, pain going up the leg, fever or chills, difficulty walking, or any other new or worsening symptoms that concern you.

## 2021-11-26 NOTE — ED Notes (Signed)
Pt A&O, IV removed, pt given discharge instructions, pt assisted to vehicle by staff.

## 2021-11-26 NOTE — ED Triage Notes (Signed)
Pt here with right leg swelling and draining. Pt has one outer wound on his right leg. Pt went to [facility] and was dx with edema, pt does not see a wound care provider currently.  Pt denies pain.

## 2021-12-01 LAB — CULTURE, BLOOD (ROUTINE X 2)
Culture: NO GROWTH
Culture: NO GROWTH
Special Requests: ADEQUATE
Special Requests: ADEQUATE

## 2021-12-11 DIAGNOSIS — I420 Dilated cardiomyopathy: Secondary | ICD-10-CM | POA: Diagnosis not present

## 2021-12-11 DIAGNOSIS — I480 Paroxysmal atrial fibrillation: Secondary | ICD-10-CM | POA: Diagnosis not present

## 2021-12-11 DIAGNOSIS — I5022 Chronic systolic (congestive) heart failure: Secondary | ICD-10-CM | POA: Diagnosis not present

## 2021-12-11 DIAGNOSIS — I10 Essential (primary) hypertension: Secondary | ICD-10-CM | POA: Diagnosis not present

## 2021-12-19 DIAGNOSIS — I4821 Permanent atrial fibrillation: Secondary | ICD-10-CM | POA: Diagnosis not present

## 2021-12-19 DIAGNOSIS — G8918 Other acute postprocedural pain: Secondary | ICD-10-CM | POA: Diagnosis not present

## 2021-12-19 DIAGNOSIS — R609 Edema, unspecified: Secondary | ICD-10-CM | POA: Diagnosis not present

## 2021-12-19 DIAGNOSIS — R82998 Other abnormal findings in urine: Secondary | ICD-10-CM | POA: Diagnosis not present

## 2021-12-19 DIAGNOSIS — I509 Heart failure, unspecified: Secondary | ICD-10-CM | POA: Diagnosis not present

## 2021-12-19 DIAGNOSIS — N1832 Chronic kidney disease, stage 3b: Secondary | ICD-10-CM | POA: Diagnosis not present

## 2021-12-19 DIAGNOSIS — E785 Hyperlipidemia, unspecified: Secondary | ICD-10-CM | POA: Diagnosis not present

## 2021-12-19 DIAGNOSIS — I10 Essential (primary) hypertension: Secondary | ICD-10-CM | POA: Diagnosis not present

## 2021-12-20 ENCOUNTER — Ambulatory Visit: Payer: PPO

## 2021-12-20 DIAGNOSIS — E785 Hyperlipidemia, unspecified: Secondary | ICD-10-CM

## 2021-12-20 DIAGNOSIS — I5040 Unspecified combined systolic (congestive) and diastolic (congestive) heart failure: Secondary | ICD-10-CM

## 2021-12-20 DIAGNOSIS — N1832 Chronic kidney disease, stage 3b: Secondary | ICD-10-CM

## 2021-12-20 DIAGNOSIS — I10 Essential (primary) hypertension: Secondary | ICD-10-CM

## 2022-01-06 ENCOUNTER — Ambulatory Visit: Payer: Self-pay | Admitting: *Deleted

## 2022-01-06 NOTE — Telephone Encounter (Signed)
  Chief Complaint: swelling in R leg Symptoms: swelling, now one area open Frequency: constant Pertinent Negatives: Patient denies fever Disposition: [] ED /[] Urgent Care (no appt availability in office) / [x] Appointment(In office/virtual)/ []  [facility]/ [] Home Care/ [] Refused Recommended Disposition /[] [facility]/ []  Follow-up with PCP Additional Notes: Pt was seen in office for this once, then the ED, advised to follow up early June and did not. He is taking lasix everyday now. His antibiotic is finished and his leg is swelling again and now had a place that opened. Appt tomorrow.   Reason for Disposition  [1] MODERATE leg swelling (e.g., swelling extends up to knees) AND [2] new-onset or worsening  Answer Assessment - Initial Assessment Questions 1. ONSET: "When did the swelling start?" (e.g., minutes, hours, days)     5 days ago 2. LOCATION: "What part of the leg is swollen?"  "Are both legs swollen or just one leg?"     Mostly right 3. SEVERITY: "How bad is the swelling?" (e.g., localized; mild, moderate, severe)  - Localized - small area of swelling localized to one leg  - MILD pedal edema - swelling limited to foot and ankle, pitting edema < 1/4 inch (6 mm) deep, rest and elevation eliminate most or all swelling  - MODERATE edema - swelling of lower leg to knee, pitting edema > 1/4 inch (6 mm) deep, rest and elevation only partially reduce swelling  - SEVERE edema - swelling extends above knee, facial or hand swelling present      Pitting edema 4. REDNESS: "Does the swelling look red or infected?"     Did but they burst clear liquid ran out 5. PAIN: "Is the swelling painful to touch?" If Yes, ask: "How painful is it?"   (Scale 1-10; mild, moderate or severe)     One area busted open 6. FEVER: "Do you have a fever?" If Yes, ask: "What is it, how was it measured, and when did it start?"      no 7. CAUSE: "What do you think is causing the leg swelling?"      Heart patient and a kidney patient 8. MEDICAL HISTORY: "Do you have a history of heart failure, kidney disease, liver failure, or cancer?"     Yes, taking lasix daily 9. RECURRENT SYMPTOM: "Have you had leg swelling before?" If Yes, ask: "When was the last time?" "What happened that time?"     Had for the first time and now it is coming back 10. OTHER SYMPTOMS: "Do you have any other symptoms?" (e.g., chest pain, difficulty breathing)       no 11. PREGNANCY: "Is there any chance you are pregnant?" "When was your last menstrual period?"       na  Protocols used: Leg Swelling and Edema-A-AH

## 2022-01-07 ENCOUNTER — Encounter: Payer: Self-pay | Admitting: Physician Assistant

## 2022-01-07 ENCOUNTER — Ambulatory Visit (INDEPENDENT_AMBULATORY_CARE_PROVIDER_SITE_OTHER): Payer: PPO | Admitting: Physician Assistant

## 2022-01-07 VITALS — BP 94/53 | HR 61 | Ht 74.0 in | Wt 244.3 lb

## 2022-01-07 DIAGNOSIS — R609 Edema, unspecified: Secondary | ICD-10-CM

## 2022-01-07 DIAGNOSIS — R238 Other skin changes: Secondary | ICD-10-CM | POA: Insufficient documentation

## 2022-01-07 DIAGNOSIS — L03115 Cellulitis of right lower limb: Secondary | ICD-10-CM | POA: Insufficient documentation

## 2022-01-07 DIAGNOSIS — I5040 Unspecified combined systolic (congestive) and diastolic (congestive) heart failure: Secondary | ICD-10-CM

## 2022-01-07 MED ORDER — DOXYCYCLINE HYCLATE 100 MG PO TABS: 100.0000 mg | ORAL_TABLET | Freq: Two times a day (BID) | ORAL | 0 refills | Status: AC

## 2022-01-07 NOTE — Assessment & Plan Note (Signed)
Bandaged w/ nonadherent dressing in office. Ref to wound care, offered home care vs in office, pt prefers in office

## 2022-01-07 NOTE — Assessment & Plan Note (Addendum)
On the edge of cellulitis but treating today, doxycycline 100 mg bid x 1 week Ordered cbc today, pt is more hypotensive than usual but asymtomatic Refer to wound care

## 2022-01-07 NOTE — Progress Notes (Signed)
I,[name],acting as a Education administrator for [employer], PA-C.,have documented all relevant documentation on the behalf of [name] Kirschner, PA-C,as directed by  [name] Kirschner, PA-C while in the presence of [name] Kirschner, PA-C.  Established patient visit   Patient: [name] MAGNER Sr.   DOB: 10-24-1940   81 y.o. Male  MRN: [id number] Visit Date: 01/07/2022  Today's healthcare provider: Mikey Kirschner, PA-C   Cc. Open wound R leg  Subjective    HPI   [name] is a 81 y/o male who presents today for right leg edema and a weeping open wound x 1 week. He was unsure how to take care of it. Denies pain. Reports previously being seen for this issues 5/23 at [facility] clinic, given doxycycline which he completed. When swelling worsened he was seen in the ED later that same day, tx with a diuretic and no additional antibiotic therapy.  He now takes lasix 20 mg twice daily.   Denies fevers, chills, body aches.   Medications: Outpatient Medications Prior to Visit  Medication Sig   amiodarone (PACERONE) 200 MG tablet Take 1 tablet (200 mg total) by mouth daily.   fluticasone (FLONASE) 50 MCG/ACT nasal spray Place 2 sprays into both nostrils daily.   furosemide (LASIX) 20 MG tablet TAKE 1 TABLET(20 MG) BY MOUTH DAILY AS NEEDED FOR FLUID RETENTION   gabapentin (NEURONTIN) 300 MG capsule Take 1 capsule (300 mg total) by mouth 2 (two) times daily.   hydrochlorothiazide (MICROZIDE) 12.5 MG capsule Take 1 capsule (12.5 mg total) by mouth daily.   levothyroxine (SYNTHROID) 100 MCG tablet TAKE 1 TABLET(100 MCG) BY MOUTH DAILY BEFORE BREAKFAST   lisinopril (ZESTRIL) 10 MG tablet Take 1 tablet (10 mg total) by mouth daily.   metoprolol succinate (TOPROL-XL) 25 MG 24 hr tablet 1 TABLET BY MOUTH ONCE A DAY   traMADol (ULTRAM) 50 MG tablet Take 50 mg by mouth 2 (two) times daily as needed.   XARELTO 20 MG TABS tablet Take 1 tablet (20 mg total) by mouth daily.   lovastatin (MEVACOR) 20 MG tablet Take 1  tablet (20 mg total) by mouth daily. (Patient not taking: Reported on 01/07/2022)   No facility-administered medications prior to visit.    Review of Systems  Constitutional:  Negative for fatigue and fever.  Respiratory:  Negative for cough and shortness of breath.   Cardiovascular:  Positive for leg swelling. Negative for chest pain and palpitations.  Skin:  Positive for wound.  Neurological:  Negative for dizziness and headaches.       Objective    Blood pressure (!) 94/53, pulse 61, height 6\' 2"  (1.88 m), weight 244 lb 4.8 oz (110.8 kg), SpO2 100 %.   Physical Exam Vitals reviewed.  Constitutional:      Appearance: He is not ill-appearing.  HENT:     Head: Normocephalic.  Eyes:     Conjunctiva/sclera: Conjunctivae normal.  Cardiovascular:     Rate and Rhythm: Normal rate.  Pulmonary:     Effort: Pulmonary effort is normal. No respiratory distress.  Musculoskeletal:     Right lower leg: 1+ Pitting Edema present.     Left lower leg: 1+ Pitting Edema present.  Skin:    Comments: R lower extremity with a dusky erythema, 5-6 cm area of macerated tissue, weeping serous fluid. Not extended past dermis. Associated warmth and pitting edema.  Neurological:     General: No focal deficit present.     Mental Status: He is alert and oriented  to person, place, and time.  Psychiatric:        Mood and Affect: Mood normal.        Behavior: Behavior normal.      No results found for any visits on 01/07/22.  Assessment & Plan     Problem List Items Addressed This Visit       Cardiovascular and Mediastinum   CHF (congestive heart failure), NYHA class II, unspecified failure chronicity, combined (HCC)    Continue lasix 20 mg bid F/u with cardiology. Will assess cmp today      Relevant Orders   Comprehensive Metabolic Panel (CMET)   CBC w/Diff/Platelet     Musculoskeletal and Integument   Skin breakdown - Primary    Bandaged w/ nonadherent dressing in office. Ref to wound  care, offered home care vs in office, pt prefers in office      Relevant Orders   AMB referral to wound care center     Other   Cellulitis of right lower extremity    On the edge of cellulitis but treating today, doxycycline 100 mg bid x 1 week Ordered cbc today, pt is more hypotensive than usual but asymtomatic Refer to wound care      Relevant Medications   doxycycline (VIBRA-TABS) 100 MG tablet   Other Visit Diagnoses     Peripheral edema       Relevant Orders   AMB referral to wound care center        Return in about 6 weeks (around 02/18/2022) for chronic conditions.      I, [name] Kirschner, PA-C have reviewed all documentation for this visit. The documentation on  01/07/2022 for the exam, diagnosis, procedures, and orders are all accurate and complete.  [name] Kirschner, PA-C [facility] [address] [place], [place], [address] Office: [phone] Fax: [phone]

## 2022-01-07 NOTE — Assessment & Plan Note (Signed)
Continue lasix 20 mg bid F/u with cardiology. Will assess cmp today

## 2022-01-08 LAB — COMPREHENSIVE METABOLIC PANEL WITH GFR
ALT: 15 IU/L (ref 0–44)
AST: 18 IU/L (ref 0–40)
Albumin/Globulin Ratio: 1.5 (ref 1.2–2.2)
Albumin: 3.8 g/dL (ref 3.8–4.8)
Alkaline Phosphatase: 109 IU/L (ref 44–121)
BUN/Creatinine Ratio: 17 (ref 10–24)
BUN: 32 mg/dL — ABNORMAL HIGH (ref 8–27)
Bilirubin Total: 0.8 mg/dL (ref 0.0–1.2)
CO2: 23 mmol/L (ref 20–29)
Calcium: 9.6 mg/dL (ref 8.6–10.2)
Chloride: 100 mmol/L (ref 96–106)
Creatinine, Ser: 1.84 mg/dL — ABNORMAL HIGH (ref 0.76–1.27)
Globulin, Total: 2.5 g/dL (ref 1.5–4.5)
Glucose: 87 mg/dL (ref 70–99)
Potassium: 4.7 mmol/L (ref 3.5–5.2)
Sodium: 140 mmol/L (ref 134–144)
Total Protein: 6.3 g/dL (ref 6.0–8.5)
eGFR: 37 mL/min/1.73 — ABNORMAL LOW

## 2022-01-08 LAB — CBC WITH DIFFERENTIAL/PLATELET
Basophils Absolute: 0.1 x10E3/uL (ref 0.0–0.2)
Basos: 1 %
EOS (ABSOLUTE): 0.1 x10E3/uL (ref 0.0–0.4)
Eos: 2 %
Hematocrit: 34.2 % — ABNORMAL LOW (ref 37.5–51.0)
Hemoglobin: 11.3 g/dL — ABNORMAL LOW (ref 13.0–17.7)
Immature Grans (Abs): 0.1 x10E3/uL (ref 0.0–0.1)
Immature Granulocytes: 1 %
Lymphocytes Absolute: 1 x10E3/uL (ref 0.7–3.1)
Lymphs: 13 %
MCH: 31 pg (ref 26.6–33.0)
MCHC: 33 g/dL (ref 31.5–35.7)
MCV: 94 fL (ref 79–97)
Monocytes Absolute: 0.8 x10E3/uL (ref 0.1–0.9)
Monocytes: 11 %
Neutrophils Absolute: 5.1 x10E3/uL (ref 1.4–7.0)
Neutrophils: 72 %
Platelets: 226 x10E3/uL (ref 150–450)
RBC: 3.65 x10E6/uL — ABNORMAL LOW (ref 4.14–5.80)
RDW: 14.6 % (ref 11.6–15.4)
WBC: 7.2 x10E3/uL (ref 3.4–10.8)

## 2022-01-09 ENCOUNTER — Other Ambulatory Visit: Payer: Self-pay

## 2022-01-09 DIAGNOSIS — I10 Essential (primary) hypertension: Secondary | ICD-10-CM

## 2022-01-14 DIAGNOSIS — R609 Edema, unspecified: Secondary | ICD-10-CM | POA: Diagnosis not present

## 2022-01-14 DIAGNOSIS — I10 Essential (primary) hypertension: Secondary | ICD-10-CM | POA: Diagnosis not present

## 2022-01-14 DIAGNOSIS — N1832 Chronic kidney disease, stage 3b: Secondary | ICD-10-CM | POA: Diagnosis not present

## 2022-01-14 DIAGNOSIS — I5022 Chronic systolic (congestive) heart failure: Secondary | ICD-10-CM | POA: Diagnosis not present

## 2022-01-14 DIAGNOSIS — I480 Paroxysmal atrial fibrillation: Secondary | ICD-10-CM | POA: Diagnosis not present

## 2022-01-14 DIAGNOSIS — I420 Dilated cardiomyopathy: Secondary | ICD-10-CM | POA: Diagnosis not present

## 2022-01-16 ENCOUNTER — Encounter: Payer: PPO | Attending: Physician Assistant | Admitting: Physician Assistant

## 2022-01-16 DIAGNOSIS — I130 Hypertensive heart and chronic kidney disease with heart failure and stage 1 through stage 4 chronic kidney disease, or unspecified chronic kidney disease: Secondary | ICD-10-CM | POA: Diagnosis not present

## 2022-01-16 DIAGNOSIS — L97512 Non-pressure chronic ulcer of other part of right foot with fat layer exposed: Secondary | ICD-10-CM | POA: Diagnosis not present

## 2022-01-16 DIAGNOSIS — I872 Venous insufficiency (chronic) (peripheral): Secondary | ICD-10-CM | POA: Insufficient documentation

## 2022-01-16 DIAGNOSIS — E1142 Type 2 diabetes mellitus with diabetic polyneuropathy: Secondary | ICD-10-CM | POA: Diagnosis not present

## 2022-01-16 DIAGNOSIS — I5042 Chronic combined systolic (congestive) and diastolic (congestive) heart failure: Secondary | ICD-10-CM | POA: Insufficient documentation

## 2022-01-16 DIAGNOSIS — L97522 Non-pressure chronic ulcer of other part of left foot with fat layer exposed: Secondary | ICD-10-CM | POA: Diagnosis not present

## 2022-01-16 DIAGNOSIS — N1832 Chronic kidney disease, stage 3b: Secondary | ICD-10-CM | POA: Insufficient documentation

## 2022-01-16 DIAGNOSIS — E1122 Type 2 diabetes mellitus with diabetic chronic kidney disease: Secondary | ICD-10-CM | POA: Diagnosis not present

## 2022-01-16 DIAGNOSIS — L97822 Non-pressure chronic ulcer of other part of left lower leg with fat layer exposed: Secondary | ICD-10-CM | POA: Diagnosis not present

## 2022-01-16 DIAGNOSIS — I87333 Chronic venous hypertension (idiopathic) with ulcer and inflammation of bilateral lower extremity: Secondary | ICD-10-CM | POA: Insufficient documentation

## 2022-01-16 DIAGNOSIS — E11621 Type 2 diabetes mellitus with foot ulcer: Secondary | ICD-10-CM | POA: Diagnosis not present

## 2022-01-16 DIAGNOSIS — I480 Paroxysmal atrial fibrillation: Secondary | ICD-10-CM | POA: Insufficient documentation

## 2022-01-16 DIAGNOSIS — L97812 Non-pressure chronic ulcer of other part of right lower leg with fat layer exposed: Secondary | ICD-10-CM | POA: Diagnosis not present

## 2022-01-16 DIAGNOSIS — G9009 Other idiopathic peripheral autonomic neuropathy: Secondary | ICD-10-CM | POA: Insufficient documentation

## 2022-01-16 DIAGNOSIS — I2510 Atherosclerotic heart disease of native coronary artery without angina pectoris: Secondary | ICD-10-CM | POA: Diagnosis not present

## 2022-01-21 NOTE — Progress Notes (Addendum)
[name] ([id number]) Visit Report for 01/16/2022 Chief Complaint Document Details Patient Name: [name], [name]. Date of Service: 01/16/2022 8:45 AM Medical Record Number: [id number] Patient Account Number: [id number] Date of Birth/Sex: [birth year] (81 y.o. M) Treating RN: Carlene Coria Primary Care Provider: [name] Other Clinician: Referring Provider: [name] Treating Provider/Extender: Skipper Cliche in Treatment: 0 Information Obtained from: Patient Chief Complaint Multiple LE Ulcers Electronic Signature(s) Signed: 01/16/2022 9:42:04 AM By: Worthy Keeler PA-C Entered By: Worthy Keeler on 01/16/2022 09:42:04 Bidinger, Vera D. ([id number]) -------------------------------------------------------------------------------- Debridement Details Patient Name: [name] Ewing D. Date of Service: 01/16/2022 8:45 AM Medical Record Number: [id number] Patient Account Number: [id number] Date of Birth/Sex: 1940-10-24 (81 y.o. M) Treating RN: Carlene Coria Primary Care Provider: [name] Other Clinician: Referring Provider: [name] Treating Provider/Extender: Skipper Cliche in Treatment: 0 Debridement Performed for Wound #2 Right,Plantar Foot Assessment: Performed By: Physician Tommie Sams., PA-C Debridement Type: Debridement Level of Consciousness (Pre- Awake and Alert procedure): Pre-procedure Verification/Time Out Yes - 09:50 Taken: Start Time: 09:50 Pain Control: Lidocaine 4% Topical Solution Total Area Debrided (L x W): 0.5 (cm) x 0.5 (cm) = 0.25 (cm) Tissue and other material Viable, Non-Viable, Callus, Slough, Subcutaneous, Skin: Dermis , Skin: Epidermis, Slough debrided: Level: Skin/Subcutaneous Tissue Debridement Description: Excisional Instrument: Curette Bleeding: Minimum Hemostasis Achieved: Pressure End Time: 10:03 Procedural Pain: 0 Post Procedural Pain: 0 Response to Treatment: Procedure was tolerated well Level of  Consciousness (Post- Awake and Alert procedure): Post Debridement Measurements of Total Wound Length: (cm) 0.5 Width: (cm) 0.2 Depth: (cm) 0.3 Volume: (cm) 0.024 Character of Wound/Ulcer Post Debridement: Improved Post Procedure Diagnosis Same as Pre-procedure Electronic Signature(s) Signed: 01/16/2022 5:07:46 PM By: Worthy Keeler PA-C Signed: 01/21/2022 3:54:37 PM By: Carlene Coria RN Entered By: Carlene Coria on 01/16/2022 10:00:35 Dorr, Shaquon D. ([id number]) -------------------------------------------------------------------------------- Debridement Details Patient Name: [name] Ewing D. Date of Service: 01/16/2022 8:45 AM Medical Record Number: [id number] Patient Account Number: [id number] Date of Birth/Sex: 09-28-1940 (81 y.o. M) Treating RN: Carlene Coria Primary Care Provider: [name] Other Clinician: Referring Provider: [name] Treating Provider/Extender: Skipper Cliche in Treatment: 0 Debridement Performed for Wound #3 Left,Plantar Foot Assessment: Performed By: Physician Tommie Sams., PA-C Debridement Type: Debridement Level of Consciousness (Pre- Awake and Alert procedure): Pre-procedure Verification/Time Out Yes - 09:50 Taken: Start Time: 09:50 Pain Control: Lidocaine 4% Topical Solution Total Area Debrided (L x W): 0.5 (cm) x 0.5 (cm) = 0.25 (cm) Tissue and other material Viable, Non-Viable, Callus, Slough, Subcutaneous, Skin: Dermis , Skin: Epidermis, Slough debrided: Level: Skin/Subcutaneous Tissue Debridement Description: Excisional Instrument: Curette Bleeding: Minimum Hemostasis Achieved: Pressure End Time: 10:03 Procedural Pain: 0 Post Procedural Pain: 0 Response to Treatment: Procedure was tolerated well Level of Consciousness (Post- Awake and Alert procedure): Post Debridement Measurements of Total Wound Length: (cm) 0.4 Width: (cm) 0.4 Depth: (cm) 0.2 Volume: (cm) 0.025 Character of Wound/Ulcer Post Debridement:  Improved Post Procedure Diagnosis Same as Pre-procedure Electronic Signature(s) Signed: 01/16/2022 5:07:46 PM By: Worthy Keeler PA-C Signed: 01/21/2022 3:54:37 PM By: Carlene Coria RN Entered By: Carlene Coria on 01/16/2022 10:04:52 Troiano, Auryn D. ([id number]) -------------------------------------------------------------------------------- HPI Details Patient Name: [name] Ewing D. Date of Service: 01/16/2022 8:45 AM Medical Record Number: [id number] Patient Account Number: [id number] Date of Birth/Sex: Apr 24, 1941 (81 y.o. M) Treating RN: Carlene Coria Primary Care Provider: [name] Other Clinician: Referring Provider: [name] Treating Provider/Extender: Skipper Cliche in Treatment: 0 History of Present Illness HPI Description: 01-16-2022 upon evaluation  today patient presents for initial inspection here in the clinic concerning issues that he has been having with wounds over the bilateral lower extremities and bilateral feet. Fortunately there does not appear to be any signs of significant infection at this point which is great news. Unfortunately his ABIs were registering at 0.49 on the left and 0.57 on the right here in the clinic on the screening today. With that being said I do think that it may possibly be true that we need to get him to formal arterial studies in order to see how things really are showing up. And the patient's not in disagreement with this for that reason we are going to make that referral as well. With that being said he does have some significant past medical history items of note detailed below. Patient does have chronic kidney disease stage IIIb, coronary artery disease, long-term use of anticoagulant therapy, dilated cardiomyopathy. Proximal atrial fibrillation, he is on Xarelto long-term. He also has congestive heart failure, hypertension, peripheral neuropathy not related to diabetes as he is only prediabetic with a hemoglobin A1c of 5.8, and  chronic venous hypertension. He has recently seen Dr. [name] show his cardiologist who did discontinue the hydrochlorothiazide and the Lasix and felt that the patient was retaining more fluid than he should be. For that reason he was started on furosemide 20 mg twice daily. Electronic Signature(s) Signed: 01/16/2022 5:03:37 PM By: Worthy Keeler PA-C Entered By: Worthy Keeler on 01/16/2022 17:03:37 Weathers, Brennden D. ([id number]) -------------------------------------------------------------------------------- Physical Exam Details Patient Name: [name] Ewing D. Date of Service: 01/16/2022 8:45 AM Medical Record Number: [id number] Patient Account Number: [id number] Date of Birth/Sex: 12-Oct-1940 (81 y.o. M) Treating RN: Carlene Coria Primary Care Provider: [name] Other Clinician: Referring Provider: [name] Treating Provider/Extender: Skipper Cliche in Treatment: 0 Constitutional sitting or standing blood pressure is within target range for patient.. pulse regular and within target range for patient.[name] respirations regular, non- labored and within target range for patient.[name] temperature within target range for patient.. Well-nourished and well-hydrated in no acute distress. Eyes conjunctiva clear no eyelid edema noted. pupils equal round and reactive to light and accommodation. Ears, Nose, Mouth, and Throat no gross abnormality of ear auricles or external auditory canals. normal hearing noted during conversation. mucus membranes moist. Respiratory normal breathing without difficulty. Cardiovascular Absent posterior tibial and dorsalis pedis pulses bilateral lower extremities. 2+ pitting edema of the bilateral lower extremities. Musculoskeletal Patient unable to walk without assistance. no significant deformity or arthritic changes, no loss or range of motion, no clubbing. Psychiatric this patient is able to make decisions and demonstrates good insight into disease process.  Alert and Oriented x 3. pleasant and cooperative. Notes Upon inspection patient's wound bed actually showed signs of decent granulation and epithelization on the legs. No sharp debridement was performed here. With that being said I did perform perform debridement of clearway some of the callus around the wounds on the foot that was pretty much all that was removed at this point. I was trying to be very careful due to the ABI findings but I did carefully clean this away as best I could. Once everything was cleared away the patient did appear to have a better chance of getting this to heal even with what I was seeing at that point. If she is going to require possibly longer than it would if he had more significant and appropriate blood flow to the extremities. Electronic Signature(s) Signed: 01/16/2022 5:04:23 PM By: Joaquim Lai  III, Talon Regala PA-C Entered By: Worthy Keeler on 01/16/2022 17:04:23 Due, [name] ([id number]) -------------------------------------------------------------------------------- Physician Orders Details Patient Name: GEORGIA, BARIA D. Date of Service: 01/16/2022 8:45 AM Medical Record Number: [id number] Patient Account Number: [id number] Date of Birth/Sex: 01-08-1941 (81 y.o. M) Treating RN: Carlene Coria Primary Care Provider: [name] Other Clinician: Referring Provider: [name] Treating Provider/Extender: Skipper Cliche in Treatment: 0 Verbal / Phone Orders: No Diagnosis Coding ICD-10 Coding Code Description [phone] Chronic venous hypertension (idiopathic) with ulcer and inflammation of bilateral lower extremity L97.812 Non-pressure chronic ulcer of other part of right lower leg with fat layer exposed L97.822 Non-pressure chronic ulcer of other part of left lower leg with fat layer exposed G90.09 Other idiopathic peripheral autonomic neuropathy L97.522 Non-pressure chronic ulcer of other part of left foot with fat layer exposed L97.512 Non-pressure chronic  ulcer of other part of right foot with fat layer exposed I10 Essential (primary) hypertension I50.42 Chronic combined systolic (congestive) and diastolic (congestive) heart failure I48.0 Paroxysmal atrial fibrillation I42.0 Dilated cardiomyopathy Z79.01 Long term (current) use of anticoagulants I25.10 Atherosclerotic heart disease of native coronary artery without angina pectoris N18.32 Chronic kidney disease, stage 3b Follow-up Appointments o Return Appointment in 1 week. Bathing/ Shower/ Hygiene o May shower with wound dressing protected with water repellent cover or cast protector. Edema Control - Lymphedema / Segmental Compressive Device / Other o Elevate, Exercise Daily and Avoid Standing for Long Periods of Time. o Elevate legs to the level of the heart and pump ankles as often as possible o Elevate leg(s) parallel to the floor when sitting. Wound Treatment Wound #1 - Lower Leg Wound Laterality: Right, Lateral Cleanser: Wound Cleanser 1 x Per Week/30 Days Discharge Instructions: Wash your hands with soap and water. Remove old dressing, discard into plastic bag and place into trash. Cleanse the wound with Wound Cleanser prior to applying a clean dressing using gauze sponges, not tissues or cotton balls. Do not scrub or use excessive force. Pat dry using gauze sponges, not tissue or cotton balls. Primary Dressing: Silvercel Small 2x2 (in/in) 1 x Per Week/30 Days Discharge Instructions: Apply Silvercel Small 2x2 (in/in) as instructed Secondary Dressing: ABD Pad 5x9 (in/in) 1 x Per Week/30 Days Discharge Instructions: Cover with ABD pad Secondary Dressing: Gauze 1 x Per Week/30 Days Discharge Instructions: As directed: dry, moistened with saline or moistened with Dakins Solution Secured With: Medipore Tape - 454M Medipore H Soft Cloth Surgical Tape, 2x2 (in/yd) 1 x Per Week/30 Days Secured With: Tubigrip Size E, 3.5x10 (in/yds) 1 x Per Week/30 Days Discharge Instructions:  Apply 3 Tubigrip E 3-finger-widths below knee to base of toes to secure dressing and/or for swelling. Wound #2 - Foot Wound Laterality: Plantar, Right Cleanser: Wound Cleanser 1 x Per Week/30 Days Rae, Alani D. ([id number]) Discharge Instructions: Wash your hands with soap and water. Remove old dressing, discard into plastic bag and place into trash. Cleanse the wound with Wound Cleanser prior to applying a clean dressing using gauze sponges, not tissues or cotton balls. Do not scrub or use excessive force. Pat dry using gauze sponges, not tissue or cotton balls. Primary Dressing: Silvercel Small 2x2 (in/in) 1 x Per Week/30 Days Discharge Instructions: Apply Silvercel Small 2x2 (in/in) as instructed Secondary Dressing: Gauze 1 x Per Week/30 Days Discharge Instructions: As directed: dry, moistened with saline or moistened with Dakins Solution Secured With: Medipore Tape - 454M Medipore H Soft Cloth Surgical Tape, 2x2 (in/yd) 1 x Per Week/30 Days Secured With: Tubigrip Size  E, 3.5x10 (in/yds) 1 x Per Week/30 Days Discharge Instructions: Apply 3 Tubigrip E 3-finger-widths below knee to base of toes to secure dressing and/or for swelling. Wound #3 - Foot Wound Laterality: Plantar, Left Cleanser: Wound Cleanser 1 x Per Week/30 Days Discharge Instructions: Wash your hands with soap and water. Remove old dressing, discard into plastic bag and place into trash. Cleanse the wound with Wound Cleanser prior to applying a clean dressing using gauze sponges, not tissues or cotton balls. Do not scrub or use excessive force. Pat dry using gauze sponges, not tissue or cotton balls. Primary Dressing: Silvercel Small 2x2 (in/in) 1 x Per Week/30 Days Discharge Instructions: Apply Silvercel Small 2x2 (in/in) as instructed Secondary Dressing: Gauze 1 x Per Week/30 Days Discharge Instructions: As directed: dry, moistened with saline or moistened with Dakins Solution Secured With: Medipore Tape - 10M Medipore H  Soft Cloth Surgical Tape, 2x2 (in/yd) 1 x Per Week/30 Days Secured With: Tubigrip Size E, 3.5x10 (in/yds) 1 x Per Week/30 Days Discharge Instructions: Apply 3 Tubigrip E 3-finger-widths below knee to base of toes to secure dressing and/or for swelling. Wound #4 - Lower Leg Wound Laterality: Left, Anterior Cleanser: Wound Cleanser 1 x Per Week/30 Days Discharge Instructions: Wash your hands with soap and water. Remove old dressing, discard into plastic bag and place into trash. Cleanse the wound with Wound Cleanser prior to applying a clean dressing using gauze sponges, not tissues or cotton balls. Do not scrub or use excessive force. Pat dry using gauze sponges, not tissue or cotton balls. Primary Dressing: Silvercel Small 2x2 (in/in) 1 x Per Week/30 Days Discharge Instructions: Apply Silvercel Small 2x2 (in/in) as instructed Secondary Dressing: ABD Pad 5x9 (in/in) 1 x Per Week/30 Days Discharge Instructions: Cover with ABD pad Secondary Dressing: Gauze 1 x Per Week/30 Days Discharge Instructions: As directed: dry, moistened with saline or moistened with Dakins Solution Secured With: Medipore Tape - 10M Medipore H Soft Cloth Surgical Tape, 2x2 (in/yd) 1 x Per Week/30 Days Secured With: Tubigrip Size E, 3.5x10 (in/yds) 1 x Per Week/30 Days Discharge Instructions: Apply 3 Tubigrip E 3-finger-widths below knee to base of toes to secure dressing and/or for swelling. Consults o Vascular - ABI TBI - (ICD10 I87.333 - Chronic venous hypertension (idiopathic) with ulcer and inflammation of bilateral lower extremity) Electronic Signature(s) Signed: 03/17/2022 2:32:45 PM By: Carlene Coria RN Signed: 03/28/2022 12:26:54 PM By: Worthy Keeler PA-C Previous Signature: 01/16/2022 5:07:46 PM Version By: Worthy Keeler PA-C Previous Signature: 01/21/2022 3:54:37 PM Version By: Carlene Coria RN Entered By: Carlene Coria on 01/31/2022 09:37:41 Kluck, Shaiden D.  ([id number]) -------------------------------------------------------------------------------- Problem List Details Patient Name: [name] Ewing D. Date of Service: 01/16/2022 8:45 AM Medical Record Number: [id number] Patient Account Number: [id number] Date of Birth/Sex: April 17, 1941 (81 y.o. M) Treating RN: Carlene Coria Primary Care Provider: [name] Other Clinician: Referring Provider: [name] Treating Provider/Extender: Skipper Cliche in Treatment: 0 Active Problems ICD-10 Encounter Code Description Active Date MDM Diagnosis I87.333 Chronic venous hypertension (idiopathic) with ulcer and inflammation of 01/16/2022 No Yes bilateral lower extremity L97.812 Non-pressure chronic ulcer of other part of right lower leg with fat layer 01/16/2022 No Yes exposed L97.822 Non-pressure chronic ulcer of other part of left lower leg with fat layer 01/16/2022 No Yes exposed G90.09 Other idiopathic peripheral autonomic neuropathy 01/16/2022 No Yes L97.522 Non-pressure chronic ulcer of other part of left foot with fat layer 01/16/2022 No Yes exposed L97.512 Non-pressure chronic ulcer of other part of right foot with  fat layer 01/16/2022 No Yes exposed [place] (primary) hypertension 01/16/2022 No Yes I50.42 Chronic combined systolic (congestive) and diastolic (congestive) heart 01/16/2022 No Yes failure I48.0 Paroxysmal atrial fibrillation 01/16/2022 No Yes I42.0 Dilated cardiomyopathy 01/16/2022 No Yes Z79.01 Long term (current) use of anticoagulants 01/16/2022 No Yes I25.10 Atherosclerotic heart disease of native coronary artery without angina 01/16/2022 No Yes pectoris Wojciak, Alistar D. ([id number]) N18.32 Chronic kidney disease, stage 3b 01/16/2022 No Yes Inactive Problems Resolved Problems Electronic Signature(s) Signed: 01/16/2022 9:41:53 AM By: Worthy Keeler PA-C Entered By: Worthy Keeler on 01/16/2022 09:41:53 Vancott, Desmin D.  ([id number]) -------------------------------------------------------------------------------- Progress Note Details Patient Name: [name] Ewing D. Date of Service: 01/16/2022 8:45 AM Medical Record Number: [id number] Patient Account Number: [id number] Date of Birth/Sex: 1941-05-31 (81 y.o. M) Treating RN: Carlene Coria Primary Care Provider: [name] Other Clinician: Referring Provider: [name] Treating Provider/Extender: Skipper Cliche in Treatment: 0 Subjective Chief Complaint Information obtained from Patient Multiple LE Ulcers History of Present Illness (HPI) 01-16-2022 upon evaluation today patient presents for initial inspection here in the clinic concerning issues that he has been having with wounds over the bilateral lower extremities and bilateral feet. Fortunately there does not appear to be any signs of significant infection at this point which is great news. Unfortunately his ABIs were registering at 0.49 on the left and 0.57 on the right here in the clinic on the screening today. With that being said I do think that it may possibly be true that we need to get him to formal arterial studies in order to see how things really are showing up. And the patient's not in disagreement with this for that reason we are going to make that referral as well. With that being said he does have some significant past medical history items of note detailed below. Patient does have chronic kidney disease stage IIIb, coronary artery disease, long-term use of anticoagulant therapy, dilated cardiomyopathy. Proximal atrial fibrillation, he is on Xarelto long-term. He also has congestive heart failure, hypertension, peripheral neuropathy not related to diabetes as he is only prediabetic with a hemoglobin A1c of 5.8, and chronic venous hypertension. He has recently seen Dr. [name] show his cardiologist who did discontinue the hydrochlorothiazide and the Lasix and felt that the patient  was retaining more fluid than he should be. For that reason he was started on furosemide 20 mg twice daily. Patient History Allergies No Known Allergies Social History Former smoker, Marital Status - Widowed, Alcohol Use - Never, Drug Use - No History, Caffeine Use - Never. Medical History Cardiovascular Patient has history of Congestive Heart Failure, Hypertension Review of Systems (ROS) Integumentary (Skin) Complains or has symptoms of Wounds, Swelling. Objective Constitutional sitting or standing blood pressure is within target range for patient.. pulse regular and within target range for patient.[name] respirations regular, non- labored and within target range for patient.[name] temperature within target range for patient.. Well-nourished and well-hydrated in no acute distress. Vitals Time Taken: 9:00 AM, Height: 74 in, Source: Stated, Weight: 244 lbs, Source: Stated, BMI: 31.3, Temperature: 97.7 F, Pulse: 56 bpm, Respiratory Rate: 18 breaths/min, Blood Pressure: 123/62 mmHg. Eyes conjunctiva clear no eyelid edema noted. pupils equal round and reactive to light and accommodation. Ears, Nose, Mouth, and Throat no gross abnormality of ear auricles or external auditory canals. normal hearing noted during conversation. mucus membranes moist. Respiratory normal breathing without difficulty. Stiefel, Davonta D. ([id number]) Cardiovascular Absent posterior tibial and dorsalis pedis pulses bilateral lower extremities. 2+ pitting edema of  the bilateral lower extremities. Musculoskeletal Patient unable to walk without assistance. no significant deformity or arthritic changes, no loss or range of motion, no clubbing. Psychiatric this patient is able to make decisions and demonstrates good insight into disease process. Alert and Oriented x 3. pleasant and cooperative. General Notes: Upon inspection patient's wound bed actually showed signs of decent granulation and epithelization on the legs. No  sharp debridement was performed here. With that being said I did perform perform debridement of clearway some of the callus around the wounds on the foot that was pretty much all that was removed at this point. I was trying to be very careful due to the ABI findings but I did carefully clean this away as best I could. Once everything was cleared away the patient did appear to have a better chance of getting this to heal even with what I was seeing at that point. If she is going to require possibly longer than it would if he had more significant and appropriate blood flow to the extremities. Integumentary (Hair, Skin) Wound #1 status is Open. Original cause of wound was Gradually Appeared. The date acquired was: 11/28/2021. The wound is located on the Right,Lateral Lower Leg. The wound measures 9cm length x 5.5cm width x 0.1cm depth; 38.877cm^2 area and 3.888cm^3 volume. There is Fat Layer (Subcutaneous Tissue) exposed. There is no tunneling or undermining noted. There is a medium amount of serosanguineous drainage noted. There is large (67-100%) red, pink granulation within the wound bed. There is a small (1-33%) amount of necrotic tissue within the wound bed including Adherent Slough. Wound #2 status is Open. Original cause of wound was Gradually Appeared. The date acquired was: 12/28/2020. The wound is located on the [place]. The wound measures 0.5cm length x 0.2cm width x 0.3cm depth; 0.079cm^2 area and 0.024cm^3 volume. There is Fat Layer (Subcutaneous Tissue) exposed. There is no tunneling or undermining noted. There is a medium amount of serosanguineous drainage noted. There is medium (34-66%) red granulation within the wound bed. There is a medium (34-66%) amount of necrotic tissue within the wound bed including Adherent Slough. Wound #3 status is Open. Original cause of wound was Gradually Appeared. The date acquired was: 12/28/2020. The wound is located on the [place]. The  wound measures 0.3cm length x 0.3cm width x 0.2cm depth; 0.071cm^2 area and 0.014cm^3 volume. There is no tunneling or undermining noted. There is a medium amount of serosanguineous drainage noted. There is no granulation within the wound bed. There is a large (67-100%) amount of necrotic tissue within the wound bed including Adherent Slough. Wound #4 status is Open. Original cause of wound was Gradually Appeared. The date acquired was: 06/30/2021. The wound is located on the Left,Anterior Lower Leg. The wound measures 1.2cm length x 0.8cm width x 0.1cm depth; 0.754cm^2 area and 0.075cm^3 volume. There is no tunneling or undermining noted. There is a medium amount of serosanguineous drainage noted. There is no granulation within the wound bed. There is a large (67-100%) amount of necrotic tissue within the wound bed including Adherent Slough. 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 There was a Excisional Skin/Subcutaneous Tissue Debridement with a total area of 0.25 sq cm performed by Tommie Sams., PA-C. With the following instrument(s): Curette to remove Viable and Non-Viable tissue/material. Material removed includes Callus, Subcutaneous Tissue, Slough, Skin: Dermis, and Skin: Epidermis after achieving pain control using Lidocaine 4% Topical Solution. No specimens were taken. A time out was conducted at 09:50, prior to the start of the procedure. A Minimum amount of bleeding was controlled with Pressure. The procedure was tolerated well with a pain level of 0 throughout and a pain level of 0 following the procedure. Post Debridement Measurements: 0.5cm length x 0.2cm width x 0.3cm depth; 0.024cm^3 volume. DEMETRIUS, MAHLER ([id number]) Character of Wound/Ulcer Post Debridement is improved. Post procedure Diagnosis Wound #2: Same as Pre-Procedure Wound #3 Pre-procedure diagnosis of Wound #3 is a Neuropathic Ulcer-Non Diabetic located on the Left,Plantar Foot . There was a Excisional Skin/Subcutaneous Tissue Debridement with a total area of 0.25 sq cm performed by Tommie Sams., PA-C. With the following instrument(s): Curette to remove Viable and Non-Viable tissue/material. Material removed includes Callus, Subcutaneous Tissue, Slough, Skin: Dermis, and Skin: Epidermis after achieving pain control using Lidocaine 4% Topical Solution. No specimens were taken. A time out was conducted at 09:50, prior to the start of the procedure. A Minimum amount of bleeding was controlled with Pressure. The procedure was tolerated well with a pain level of 0 throughout and a pain level of 0 following the procedure. Post Debridement Measurements: 0.4cm length x 0.4cm width x 0.2cm depth; 0.025cm^3 volume. Character of Wound/Ulcer Post Debridement is improved. Post procedure Diagnosis Wound #3: Same as Pre-Procedure Plan Follow-up Appointments: Return Appointment in 1 week. Bathing/ Shower/ Hygiene: May shower with wound dressing protected with water repellent cover or cast protector. Edema Control -  Lymphedema / Segmental Compressive Device / Other: Elevate, Exercise Daily and Avoid Standing for Long Periods of Time. Elevate legs to the level of the heart and pump ankles as often as possible Elevate leg(s) parallel to the floor when sitting. WOUND #1: - Lower Leg Wound Laterality: Right, Lateral Cleanser: Wound Cleanser 1 x Per Week/30 Days Discharge Instructions: Wash your hands with soap and water. Remove old dressing, discard into plastic bag and place into trash. Cleanse the wound with Wound Cleanser prior to applying a clean dressing using gauze sponges, not tissues or cotton balls. Do not scrub or use excessive force. Pat dry using gauze sponges, not tissue or cotton balls. Primary Dressing: Silvercel Small 2x2 (in/in) 1 x Per Week/30 Days Discharge Instructions: Apply Silvercel Small 2x2 (in/in) as instructed Secondary Dressing: ABD Pad 5x9 (in/in) 1 x Per Week/30 Days Discharge Instructions: Cover with ABD pad Secondary Dressing: Gauze 1 x Per Week/30 Days Discharge Instructions: As directed: dry, moistened with saline or moistened with Dakins Solution Secured With: Medipore Tape - 42M Medipore H Soft Cloth Surgical Tape, 2x2 (in/yd) 1 x Per Week/30 Days Secured With: Tubigrip Size E, 3.5x10 (in/yds) 1 x Per Week/30 Days Discharge Instructions: Apply 3 Tubigrip E 3-finger-widths below knee to base of toes to secure dressing and/or for swelling. WOUND #2: - Foot Wound Laterality: Plantar, Right  Cleanser: Wound Cleanser 1 x Per Week/30 Days Discharge Instructions: Wash your hands with soap and water. Remove old dressing, discard into plastic bag and place into trash. Cleanse the wound with Wound Cleanser prior to applying a clean dressing using gauze sponges, not tissues or cotton balls. Do not scrub or use excessive force. Pat dry using gauze sponges, not tissue or cotton balls. Primary Dressing: Silvercel Small 2x2 (in/in) 1 x Per Week/30 Days Discharge Instructions: Apply  Silvercel Small 2x2 (in/in) as instructed Secondary Dressing: Gauze 1 x Per Week/30 Days Discharge Instructions: As directed: dry, moistened with saline or moistened with Dakins Solution Secured With: Medipore Tape - 47M Medipore H Soft Cloth Surgical Tape, 2x2 (in/yd) 1 x Per Week/30 Days Secured With: Tubigrip Size E, 3.5x10 (in/yds) 1 x Per Week/30 Days Discharge Instructions: Apply 3 Tubigrip E 3-finger-widths below knee to base of toes to secure dressing and/or for swelling. WOUND #3: - Foot Wound Laterality: Plantar, Left Cleanser: Wound Cleanser 1 x Per Week/30 Days Discharge Instructions: Wash your hands with soap and water. Remove old dressing, discard into plastic bag and place into trash. Cleanse the wound with Wound Cleanser prior to applying a clean dressing using gauze sponges, not tissues or cotton balls. Do not scrub or use excessive force. Pat dry using gauze sponges, not tissue or cotton balls. Primary Dressing: Silvercel Small 2x2 (in/in) 1 x Per Week/30 Days Discharge Instructions: Apply Silvercel Small 2x2 (in/in) as instructed Secondary Dressing: Gauze 1 x Per Week/30 Days Discharge Instructions: As directed: dry, moistened with saline or moistened with Dakins Solution Secured With: Medipore Tape - 47M Medipore H Soft Cloth Surgical Tape, 2x2 (in/yd) 1 x Per Week/30 Days Secured With: Tubigrip Size E, 3.5x10 (in/yds) 1 x Per Week/30 Days Discharge Instructions: Apply 3 Tubigrip E 3-finger-widths below knee to base of toes to secure dressing and/or for swelling. WOUND #4: - Lower Leg Wound Laterality: Left, Anterior Cleanser: Wound Cleanser 1 x Per Week/30 Days Discharge Instructions: Wash your hands with soap and water. Remove old dressing, discard into plastic bag and place into trash. Cleanse the wound with Wound Cleanser prior to applying a clean dressing using gauze sponges, not tissues or cotton balls. Do not scrub or use excessive force. Pat dry using gauze sponges,  not tissue or cotton balls. Primary Dressing: Silvercel Small 2x2 (in/in) 1 x Per Week/30 Days Discharge Instructions: Apply Silvercel Small 2x2 (in/in) as instructed Secondary Dressing: ABD Pad 5x9 (in/in) 1 x Per Week/30 Days Discharge Instructions: Cover with ABD pad Secondary Dressing: Gauze 1 x Per Week/30 Days Zuno, Lucero D. ([id number]) Discharge Instructions: As directed: dry, moistened with saline or moistened with Dakins Solution Secured With: Medipore Tape - 47M Medipore H Soft Cloth Surgical Tape, 2x2 (in/yd) 1 x Per Week/30 Days Secured With: Tubigrip Size E, 3.5x10 (in/yds) 1 x Per Week/30 Days Discharge Instructions: Apply 3 Tubigrip E 3-finger-widths below knee to base of toes to secure dressing and/or for swelling. 1. I would recommend currently that we go ahead and initiate treatment with a silver alginate dressing to all wound locations. Actually believe this is good to be a good option here for him. The patient is in agreement with the plan. 2. I am also can recommend that we use ABD pad and roll gauze to secure in place. 3. We will use Tubigrip size E single-layer in order to help with a little bit of compression control although we definitely need to see what vascular has to say about his  formal arterial study before any stronger compression. 4. I am going to make the referral to vascular for further evaluation of his peripheral arterial disease. We will see patient back for reevaluation in 1 week here in the clinic. If anything worsens or changes patient will contact our office for additional recommendations. Electronic Signature(s) Signed: 01/16/2022 5:06:36 PM By: Worthy Keeler PA-C Entered By: Worthy Keeler on 01/16/2022 17:06:36 Hardt, Karel D. ([id number]) -------------------------------------------------------------------------------- ROS/PFSH Details Patient Name: [name] Ewing D. Date of Service: 01/16/2022 8:45 AM Medical Record Number:  [id number] Patient Account Number: [id number] Date of Birth/Sex: Dec 09, 1940 (81 y.o. M) Treating RN: Carlene Coria Primary Care Provider: [name] Other Clinician: Referring Provider: [name] Treating Provider/Extender: Skipper Cliche in Treatment: 0 Integumentary (Skin) Complaints and Symptoms: Positive for: Wounds; Swelling Cardiovascular Medical History: Positive for: Congestive Heart Failure; Hypertension Immunizations Pneumococcal Vaccine: Received Pneumococcal Vaccination: Yes Received Pneumococcal Vaccination On or After 60th Birthday: Yes Implantable Devices None Family and Social History Former smoker; Marital Status - Widowed; Alcohol Use: Never; Drug Use: No History; Caffeine Use: Never Electronic Signature(s) Signed: 01/16/2022 5:07:46 PM By: Worthy Keeler PA-C Signed: 01/21/2022 3:54:37 PM By: Carlene Coria RN Entered By: Carlene Coria on 01/16/2022 09:03:40 Vandenboom, Marshun D. ([id number]) -------------------------------------------------------------------------------- SuperBill Details Patient Name: [name] Ewing D. Date of Service: 01/16/2022 Medical Record Number: [id number] Patient Account Number: [id number] Date of Birth/Sex: 01/17/1941 (81 y.o. M) Treating RN: Carlene Coria Primary Care Provider: [name] Other Clinician: Referring Provider: [name] Treating Provider/Extender: Skipper Cliche in Treatment: 0 Diagnosis Coding ICD-10 Codes Code Description 3347965616 Chronic venous hypertension (idiopathic) with ulcer and inflammation of bilateral lower extremity L97.812 Non-pressure chronic ulcer of other part of right lower leg with fat layer exposed L97.822 Non-pressure chronic ulcer of other part of left lower leg with fat layer exposed G90.09 Other idiopathic peripheral autonomic neuropathy L97.522 Non-pressure chronic ulcer of other part of left foot with fat layer exposed L97.512 Non-pressure chronic ulcer of other part of right foot  with fat layer exposed I10 Essential (primary) hypertension I50.42 Chronic combined systolic (congestive) and diastolic (congestive) heart failure I48.0 Paroxysmal atrial fibrillation I42.0 Dilated cardiomyopathy Z79.01 Long term (current) use of anticoagulants I25.10 Atherosclerotic heart disease of native coronary artery without angina pectoris N18.32 Chronic kidney disease, stage 3b Facility Procedures CPT4 Code: 81856314 Description: 99213 - WOUND CARE VISIT-LEV 3 EST PT Modifier: Quantity: 1 CPT4 Code: 97026378 Description: 11042 - DEB SUBQ TISSUE 20 SQ CM/< Modifier: Quantity: 1 CPT4 Code: Description: ICD-10 Diagnosis Description L97.522 Non-pressure chronic ulcer of other part of left foot with fat layer exp L97.512 Non-pressure chronic ulcer of other part of right foot with fat layer ex Modifier: osed posed Quantity: Physician Procedures CPT4 Code Description: 5885027 74128 - WC PHYS LEVEL 4 - NEW PT Modifier: 25 Quantity: 1 CPT4 Code Description: ICD-10 Diagnosis Description I87.333 Chronic venous hypertension (idiopathic) with ulcer and inflammation of bil L97.812 Non-pressure chronic ulcer of other part of right lower leg with fat layer L97.822 Non-pressure chronic ulcer  of other part of left lower leg with fat layer e G90.09 Other idiopathic peripheral autonomic neuropathy Modifier: ateral lower extre exposed xposed Quantity: mity CPT4 Code Description: [id number] 11042 - WC PHYS SUBQ TISS 20 SQ CM Modifier: Quantity: 1 CPT4 Code Description: ICD-10 Diagnosis Description L97.522 Non-pressure chronic ulcer of other part of left foot with fat layer expose L97.512 Non-pressure chronic ulcer of other part of right foot with fat layer expos Modifier: d ed Quantity: Electronic Signature(s)  Signed: 01/16/2022 5:07:02 PM By: Worthy Keeler PA-C Entered By: Worthy Keeler on 01/16/2022 17:07:02

## 2022-01-21 NOTE — Progress Notes (Signed)
[name], [name] ([id number]) Visit Report for 01/16/2022 Abuse Risk Screen Details Patient Name: [name], [name]. Date of Service: 01/16/2022 8:45 AM Medical Record Number: [id number] Patient Account Number: [id number] Date of Birth/Sex: 01/06/1941 (81 y.o. M) Treating RN: [name] Primary Care [name]: [name] Other Clinician: Referring [name]: [name] Treating [name]/Extender: [name] in Treatment: 0 Abuse Risk Screen Items Answer ABUSE RISK SCREEN: Has anyone close to you tried to hurt or harm you recentlyo No Do you feel uncomfortable with anyone in your familyo No Has anyone forced you do things that you didnot want to doo No Electronic Signature(s) Signed: 01/21/2022 3:54:37 PM By: [name] Coria RN Entered By: [name] on 01/16/2022 09:03:45 [name], [name] ([id number]) -------------------------------------------------------------------------------- Activities of Daily Living Details Patient Name: [name], [name] Date of Service: 01/16/2022 8:45 AM Medical Record Number: [id number] Patient Account Number: [id number] Date of Birth/Sex: 03-23-1941 (81 y.o. M) Treating RN: [name] Primary Care [name]: [name] Other Clinician: Referring [name]: [name] Treating [name]/Extender: [name] in Treatment: 0 Activities of Daily Living Items Answer Activities of Daily Living (Please select one for each item) Drive Automobile Completely Able Take Medications Completely Able Use Telephone Completely Able Care for Appearance Completely Able Use Toilet Completely Able Bath / Shower Completely Able Dress Self Completely Able Feed Self Completely Able Walk Completely Able Get In / Out Bed Completely Able Housework Completely Able Prepare Meals Completely Able Handle Money Completely Able Shop for Self Completely Able Electronic Signature(s) Signed: 01/21/2022 3:54:37 PM By: [name] Coria RN Entered By: [name]  on 01/16/2022 09:04:07 [name], [name] ([id number]) -------------------------------------------------------------------------------- Education Screening Details Patient Name: [name] Ewing D. Date of Service: 01/16/2022 8:45 AM Medical Record Number: [id number] Patient Account Number: [id number] Date of Birth/Sex: Oct 04, 1940 (81 y.o. M) Treating RN: [name] Primary Care [name]: [name] Other Clinician: Referring [name]: [name] Treating [name]/Extender: [name] in Treatment: 0 Primary Learner Assessed: Patient Learning Preferences/Education Level/Primary Language Learning Preference: Explanation Highest Education Level: High School Preferred Language: English Cognitive Barrier Language Barrier: No Translator Needed: No Memory Deficit: No Emotional Barrier: No Cultural/Religious Beliefs Affecting Medical Care: No Physical Barrier Impaired Vision: No Impaired Hearing: No Decreased Hand dexterity: No Knowledge/Comprehension Knowledge Level: Medium Comprehension Level: Medium Ability to understand written instructions: Medium Ability to understand verbal instructions: Medium Motivation Anxiety Level: Anxious Cooperation: Cooperative Education Importance: Acknowledges Need Interest in Health Problems: Asks Questions Perception: Coherent Willingness to Engage in Self-Management High Activities: Readiness to Engage in Self-Management High Activities: Electronic Signature(s) Signed: 01/21/2022 3:54:37 PM By: [name] Coria RN Entered By: [name] on 01/16/2022 09:04:56 [name], [name] ([id number]) -------------------------------------------------------------------------------- Fall Risk Assessment Details Patient Name: [name] Ewing D. Date of Service: 01/16/2022 8:45 AM Medical Record Number: [id number] Patient Account Number: [id number] Date of Birth/Sex: [birth year] (81 y.o. M) Treating RN: [name] Primary Care [name]: [name] Other Clinician: Referring [name]: [name] Treating [name]/Extender: [name] in Treatment: 0 Fall Risk Assessment Items Have you had 2 or more falls in the last 12 monthso 0 No Have you had any fall that resulted in injury in the last 12 monthso 0 No FALLS RISK SCREEN History of falling - immediate or within 3 months 0 No Secondary diagnosis (Do you have 2 or more medical diagnoseso) 0 No Ambulatory aid None/bed rest/wheelchair/nurse 0 No Crutches/cane/walker 0 No Furniture 0 No Intravenous therapy Access/Saline/Heparin Lock 0 No Gait/Transferring Normal/ bed rest/ wheelchair 0 No Weak (short steps with or  without shuffle, stooped but able to lift head while walking, may 0 No seek support from furniture) Impaired (short steps with shuffle, may have difficulty arising from chair, head down, impaired 0 No balance) Mental Status Oriented to own ability 0 No Electronic Signature(s) Signed: 01/21/2022 3:54:37 PM By: [name] Coria RN Entered By: [name] on 01/16/2022 09:05:03 [name], [name] ([id number]) -------------------------------------------------------------------------------- Foot Assessment Details Patient Name: [name] Ewing D. Date of Service: 01/16/2022 8:45 AM Medical Record Number: [id number] Patient Account Number: [id number] Date of Birth/Sex: 1940/11/14 (81 y.o. M) Treating RN: [name] Primary Care [name]: [name] Other Clinician: Referring [name]: [name] Treating Kamry Faraci/Extender: [name] in Treatment: 0 Foot Assessment Items Site Locations + = Sensation present, - = Sensation absent, C = Callus, U = Ulcer R = Redness, W = Warmth, M = Maceration, PU = Pre-ulcerative lesion F = Fissure, S = Swelling, D = Dryness Assessment Right: Left: Other Deformity: No No Prior Foot Ulcer: No No Prior Amputation: No No Charcot Joint: No No Ambulatory Status: Ambulatory Without Help Gait: Steady Electronic  Signature(s) Signed: 01/21/2022 3:54:37 PM By: [name] Coria RN Entered By: [name] on 01/16/2022 09:20:35 [name], [name] ([id number]) -------------------------------------------------------------------------------- Nutrition Risk Screening Details Patient Name: [name] Ewing D. Date of Service: 01/16/2022 8:45 AM Medical Record Number: [id number] Patient Account Number: [id number] Date of Birth/Sex: 05/09/1941 (82 y.o. M) Treating RN: [name] Primary Care Aspasia Rude: [name] Other Clinician: Referring Cassell Voorhies: [name] Treating Melanye Hiraldo/Extender: [name] in Treatment: 0 Height (in): 74 Weight (lbs): 244 Body Mass Index (BMI): 31.3 Nutrition Risk Screening Items Score Screening NUTRITION RISK SCREEN: I have an illness or condition that made me change the kind and/or amount of food I eat 0 No I eat fewer than two meals per day 0 No I eat few fruits and vegetables, or milk products 0 No I have three or more drinks of beer, liquor or wine almost every day 0 No I have tooth or mouth problems that make it hard for me to eat 0 No I don't always have enough money to buy the food I need 0 No I eat alone most of the time 0 No I take three or more different prescribed or over-the-counter drugs a day 1 Yes Without wanting to, I have lost or gained 10 pounds in the last six months 0 No I am not always physically able to shop, cook and/or feed myself 0 No Nutrition Protocols Good Risk Protocol 0 No interventions needed Moderate Risk Protocol High Risk Proctocol Risk Level: Good Risk Score: 1 Electronic Signature(s) Signed: 01/21/2022 3:54:37 PM By: [name] Coria RN Entered By: [name] on 01/16/2022 09:05:12

## 2022-01-21 NOTE — Progress Notes (Signed)
[name] ([id number]) Visit Report for 01/16/2022 Allergy List Details Patient Name: [name], [name]. Date of Service: 01/16/2022 8:45 AM Medical Record Number: [id number] Patient Account Number: [id number] Date of Birth/Sex: [birth year] (81 y.o. M) Treating RN: Carlene Coria Primary Care Kashonda Sarkisyan: [name] Other Clinician: Referring Cyenna Rebello: [name] Treating Nicolet Griffy/Extender: [name] Weeks in Treatment: 0 Allergies Active Allergies No Known Allergies Allergy Notes Electronic Signature(s) Signed: 01/21/2022 3:54:37 PM By: Carlene Coria RN Entered By: Carlene Coria on 01/16/2022 09:00:56 [name], [name] ([id number]) -------------------------------------------------------------------------------- Arrival Information Details Patient Name: [name] Ewing D. Date of Service: 01/16/2022 8:45 AM Medical Record Number: [id number] Patient Account Number: [id number] Date of Birth/Sex: 10/17/1940 (81 y.o. M) Treating RN: Carlene Coria Primary Care Savita Runner: [name] Other Clinician: Referring Lamario Mani: [name] Treating Hadas Jessop/Extender: Skipper Cliche in Treatment: 0 Visit Information Patient Arrived: [name] Arrival Time: 08:57 Accompanied By: self Transfer Assistance: None Patient Identification Verified: Yes Secondary Verification Process Completed: Yes Patient Requires Transmission-Based Precautions: No Patient Has Alerts: No Electronic Signature(s) Signed: 01/21/2022 3:54:37 PM By: Carlene Coria RN Entered By: Carlene Coria on 01/16/2022 08:58:50 Wish, [name] ([id number]) -------------------------------------------------------------------------------- Clinic Level of Care Assessment Details Patient Name: [name], [name] Date of Service: 01/16/2022 8:45 AM Medical Record Number: [id number] Patient Account Number: [id number] Date of Birth/Sex: 11-18-1940 (81 y.o. M) Treating RN: Carlene Coria Primary Care Cooper Stamp: [name] Other  Clinician: Referring Amandalynn Pitz: [name] Treating Sebastain Fishbaugh/Extender: Skipper Cliche in Treatment: 0 Clinic Level of Care Assessment Items TOOL 1 Quantity Score X - Use when EandM and Procedure is performed on INITIAL visit 1 0 ASSESSMENTS - Nursing Assessment / Reassessment X - General Physical Exam (combine w/ comprehensive assessment (listed just below) when performed on new 1 20 pt. evals) X- 1 25 Comprehensive Assessment (HX, ROS, Risk Assessments, Wounds Hx, etc.) ASSESSMENTS - Wound and Skin Assessment / Reassessment []  - Dermatologic / Skin Assessment (not related to wound area) 0 ASSESSMENTS - Ostomy and/or Continence Assessment and Care []  - Incontinence Assessment and Management 0 []  - 0 Ostomy Care Assessment and Management (repouching, etc.) PROCESS - Coordination of Care []  - Simple Patient / Family Education for ongoing care 0 []  - 0 Complex (extensive) Patient / Family Education for ongoing care X- 1 10 Staff obtains Consents, Records, Test Results / Process Orders []  - 0 Staff telephones HHA, Nursing Homes / Clarify orders / etc []  - 0 Routine Transfer to another Facility (non-emergent condition) []  - 0 Routine Hospital Admission (non-emergent condition) X- 1 15 New Admissions / Biomedical engineer / Ordering NPWT, Apligraf, etc. []  - 0 Emergency Hospital Admission (emergent condition) PROCESS - Special Needs []  - Pediatric / Minor Patient Management 0 []  - 0 Isolation Patient Management []  - 0 Hearing / Language / Visual special needs []  - 0 Assessment of Community assistance (transportation, D/C planning, etc.) []  - 0 Additional assistance / Altered mentation []  - 0 Support Surface(s) Assessment (bed, cushion, seat, etc.) INTERVENTIONS - Miscellaneous []  - External ear exam 0 []  - 0 Patient Transfer (multiple staff / Civil Service fast streamer / Similar devices) []  - 0 Simple Staple / Suture removal (25 or less) []  - 0 Complex Staple / Suture removal  (26 or more) []  - 0 Hypo/Hyperglycemic Management (do not check if billed separately) X- 1 15 Ankle / Brachial Index (ABI) - do not check if billed separately Has the patient been seen at the hospital within the last three years: Yes Total Score: 85 Level Of Care: New/Established -  Level 3 [name], [name] ([id number]) Electronic Signature(s) Signed: 01/21/2022 3:54:37 PM By: Carlene Coria RN Entered By: Carlene Coria on 01/16/2022 10:46:00 [name], [name] ([id number]) -------------------------------------------------------------------------------- Encounter Discharge Information Details Patient Name: [name] Ewing D. Date of Service: 01/16/2022 8:45 AM Medical Record Number: [id number] Patient Account Number: [id number] Date of Birth/Sex: 1941/06/09 (81 y.o. M) Treating RN: Carlene Coria Primary Care Sirius Woodford: [name] Other Clinician: Referring Blenda Wisecup: [name] Treating [place] Crance/Extender: Skipper Cliche in Treatment: 0 Encounter Discharge Information Items Post Procedure Vitals Discharge Condition: Stable Temperature (F): 97.7 Ambulatory Status: Walker Pulse (bpm): 56 Discharge Destination: Home Respiratory Rate (breaths/min): 18 Transportation: Private Auto Blood Pressure (mmHg): 123/62 Accompanied By: self Schedule Follow-up Appointment: Yes Clinical Summary of Care: Electronic Signature(s) Signed: 01/21/2022 3:54:37 PM By: Carlene Coria RN Entered By: Carlene Coria on 01/16/2022 10:09:56 [name], [name] ([id number]) -------------------------------------------------------------------------------- Lower Extremity Assessment Details Patient Name: [name] Ewing D. Date of Service: 01/16/2022 8:45 AM Medical Record Number: [id number] Patient Account Number: [id number] Date of Birth/Sex: August 17, 1940 (81 y.o. M) Treating RN: Carlene Coria Primary Care Hildred Pharo: [name] Other Clinician: Referring Elyan Vanwieren: [name] Treating Mychelle Kendra/Extender: [name] Weeks in Treatment: 0 Edema Assessment Assessed: [Left: No] [Right: No] Edema: [Left: Yes] [Right: Yes] Calf Left: Right: Point of Measurement: 36 cm From Medial Instep 42 cm 44.4 cm Ankle Left: Right: Point of Measurement: 15 cm From Medial Instep 29 cm 27.2 cm Knee To Floor Left: Right: From Medial Instep 48 cm 48 cm Vascular Assessment Pulses: Dorsalis Pedis Palpable: [Left:Yes] [Right:Yes] Doppler Audible: [Left:Yes] [Right:Yes] Blood Pressure: Brachial: [Left:123] [Right:123] Ankle: [Left:Dorsalis Pedis: 60 0.49] [Right:Dorsalis Pedis: 70 0.57] Electronic Signature(s) Signed: 01/21/2022 3:54:37 PM By: Carlene Coria RN Entered By: Carlene Coria on 01/16/2022 09:22:07 [name], [name] ([id number]) -------------------------------------------------------------------------------- Multi Wound Chart Details Patient Name: [name] Ewing D. Date of Service: 01/16/2022 8:45 AM Medical Record Number: [id number] Patient Account Number: [id number] Date of Birth/Sex: 01-10-1941 (81 y.o. M) Treating RN: Carlene Coria Primary Care Shreshta Medley: [name] Other Clinician: Referring Praneel Haisley: [name] Treating Keryn Nessler/Extender: [name] Weeks in Treatment: 0 Vital Signs Height(in): 74 Pulse(bpm): 33 Weight(lbs): 244 Blood Pressure(mmHg): 123/62 Body Mass Index(BMI): 31.3 Temperature(F): 97.7 Respiratory Rate(breaths/min): 18 Photos: Wound Location: Right, Lateral Lower Leg Right, Plantar Foot Left, Plantar Foot Wounding Event: Gradually Appeared Gradually Appeared Gradually Appeared Primary Etiology: Venous Leg Ulcer Neuropathic Ulcer-Non Diabetic Neuropathic Ulcer-Non Diabetic Comorbid History: Congestive Heart Failure, Congestive Heart Failure, Congestive Heart Failure, Hypertension Hypertension Hypertension Date Acquired: 11/28/2021 12/28/2020 12/28/2020 Weeks of Treatment: 0 0 0 Wound Status: Open Open Open Wound Recurrence: No No No Measurements L x W x D (cm)  9x5.5x0.1 0.5x0.2x0.3 0.3x0.3x0.2 Area (cm) : 38.877 0.079 0.071 Volume (cm) : 3.888 0.024 0.014 Classification: Full Thickness Without Exposed Full Thickness Without Exposed Full Thickness Without Exposed Support Structures Support Structures Support Structures Exudate Amount: Medium Medium Medium Exudate Type: Serosanguineous Serosanguineous Serosanguineous Exudate Color: red, brown red, brown red, brown Granulation Amount: Large (67-100%) Medium (34-66%) None Present (0%) Granulation Quality: Red, Pink Red N/A Necrotic Amount: Small (1-33%) Medium (34-66%) Large (67-100%) Exposed Structures: Fat Layer (Subcutaneous Tissue): Fat Layer (Subcutaneous Tissue): Fascia: No Yes Yes Fat Layer (Subcutaneous Tissue): Fascia: No No Tendon: No Tendon: No Muscle: No Muscle: No Joint: No Joint: No Bone: No Bone: No Epithelialization: None None N/A Wound Number: 4 N/A N/A Photos: N/A N/A Wound Location: Left, Anterior Lower Leg N/A N/A Wounding Event: Gradually Appeared N/A N/A [name], [name] ([id number]) Primary Etiology: Venous Leg Ulcer N/A N/A  Comorbid History: Congestive Heart Failure, N/A N/A Hypertension Date Acquired: 06/30/2021 N/A N/A Weeks of Treatment: 0 N/A N/A Wound Status: Open N/A N/A Wound Recurrence: No N/A N/A Measurements L x W x D (cm) 1.2x0.8x0.1 N/A N/A Area (cm) : 0.754 N/A N/A Volume (cm) : 0.075 N/A N/A Classification: Full Thickness Without Exposed N/A N/A Support Structures Exudate Amount: Medium N/A N/A Exudate Type: Serosanguineous N/A N/A Exudate Color: red, brown N/A N/A Granulation Amount: None Present (0%) N/A N/A Granulation Quality: N/A N/A N/A Necrotic Amount: Large (67-100%) N/A N/A Exposed Structures: Fascia: No N/A N/A Fat Layer (Subcutaneous Tissue): No Tendon: No Muscle: No Joint: No Bone: No Epithelialization: None N/A N/A Treatment Notes Electronic Signature(s) Signed: 01/21/2022 3:54:37 PM By: Carlene Coria RN Entered  By: Carlene Coria on 01/16/2022 09:50:10 [name], [name] ([id number]) -------------------------------------------------------------------------------- Multi-Disciplinary Care Plan Details Patient Name: [name] Ewing D. Date of Service: 01/16/2022 8:45 AM Medical Record Number: [id number] Patient Account Number: [id number] Date of Birth/Sex: 10/04/1940 (81 y.o. M) Treating RN: Carlene Coria Primary Care Demica Zook: [name] Other Clinician: Referring Dartanian Knaggs: [name] Treating Heylee Tant/Extender: Skipper Cliche in Treatment: 0 Active Inactive Wound/Skin Impairment Nursing Diagnoses: Knowledge deficit related to ulceration/compromised skin integrity Goals: Patient/caregiver will verbalize understanding of skin care regimen Date Initiated: 01/16/2022 Target Resolution Date: 02/16/2022 Goal Status: Active Ulcer/skin breakdown will have a volume reduction of 30% by week 4 Date Initiated: 01/16/2022 Target Resolution Date: 02/16/2022 Goal Status: Active Ulcer/skin breakdown will have a volume reduction of 50% by week 8 Date Initiated: 01/16/2022 Target Resolution Date: 03/19/2022 Goal Status: Active Ulcer/skin breakdown will have a volume reduction of 80% by week 12 Date Initiated: 01/16/2022 Target Resolution Date: 04/18/2022 Goal Status: Active Ulcer/skin breakdown will heal within 14 weeks Date Initiated: 01/16/2022 Target Resolution Date: 05/19/2022 Goal Status: Active Interventions: Assess patient/caregiver ability to obtain necessary supplies Assess patient/caregiver ability to perform ulcer/skin care regimen upon admission and as needed Assess ulceration(s) every visit Notes: Electronic Signature(s) Signed: 01/21/2022 3:54:37 PM By: Carlene Coria RN Entered By: Carlene Coria on 01/16/2022 09:49:46 [name], [name] ([id number]) -------------------------------------------------------------------------------- Pain Assessment Details Patient Name: [name] Ewing D. Date  of Service: 01/16/2022 8:45 AM Medical Record Number: [id number] Patient Account Number: [id number] Date of Birth/Sex: 1940-07-24 (81 y.o. M) Treating RN: Carlene Coria Primary Care Maricus Tanzi: [name] Other Clinician: Referring Kenae Lindquist: [name] Treating Kamaree Wheatley/Extender: Skipper Cliche in Treatment: 0 Active Problems Location of Pain Severity and Description of Pain Patient Has Paino No Site Locations Pain Management and Medication Current Pain Management: Electronic Signature(s) Signed: 01/21/2022 3:54:37 PM By: Carlene Coria RN Entered By: Carlene Coria on 01/16/2022 09:00:08 Marcotte, Yacoub D. ([id number]) -------------------------------------------------------------------------------- Patient/Caregiver Education Details Patient Name: [name] Ewing D. Date of Service: 01/16/2022 8:45 AM Medical Record Number: [id number] Patient Account Number: [id number] Date of Birth/Gender: [birth year] (81 y.o. M) Treating RN: Carlene Coria Primary Care Physician: [name] Other Clinician: Referring Physician: [name] Treating Physician/Extender: Skipper Cliche in Treatment: 0 Education Assessment Education Provided To: Patient Education Topics Provided Wound/Skin Impairment: Methods: Explain/Verbal Responses: State content correctly Electronic Signature(s) Signed: 01/21/2022 3:54:37 PM By: Carlene Coria RN Entered By: Carlene Coria on 01/16/2022 10:09:08 Alcalde, Dondi D. ([id number]) -------------------------------------------------------------------------------- Wound Assessment Details Patient Name: [name] Ewing D. Date of Service: 01/16/2022 8:45 AM Medical Record Number: [id number] Patient Account Number: [id number] Date of Birth/Sex: [birth year] (81 y.o. M) Treating RN: Carlene Coria Primary Care Danni Shima: [name] Other Clinician: Referring Tayona Sarnowski: [name] Treating Jobie Popp/Extender: [name] Weeks in Treatment: 0 Wound  Status Wound Number:  1 Primary Etiology: Venous Leg Ulcer Wound Location: Right, Lateral Lower Leg Wound Status: Open Wounding Event: Gradually Appeared Comorbid History: Congestive Heart Failure, Hypertension Date Acquired: 11/28/2021 Weeks Of Treatment: 0 Clustered Wound: No Photos Wound Measurements Length: (cm) 9 Width: (cm) 5.5 Depth: (cm) 0.1 Area: (cm) 38.877 Volume: (cm) 3.888 % Reduction in Area: % Reduction in Volume: Epithelialization: None Tunneling: No Undermining: No Wound Description Classification: Full Thickness Without Exposed Support Structures Exudate Amount: Medium Exudate Type: Serosanguineous Exudate Color: red, brown Foul Odor After Cleansing: No Slough/Fibrino Yes Wound Bed Granulation Amount: Large (67-100%) Exposed Structure Granulation Quality: Red, Pink Fascia Exposed: No Necrotic Amount: Small (1-33%) Fat Layer (Subcutaneous Tissue) Exposed: Yes Necrotic Quality: Adherent Slough Tendon Exposed: No Muscle Exposed: No Joint Exposed: No Bone Exposed: No Treatment Notes Wound #1 (Lower Leg) Wound Laterality: Right, Lateral Cleanser Wound Cleanser Discharge Instruction: Wash your hands with soap and water. Remove old dressing, discard into plastic bag and place into trash. Cleanse the wound with Wound Cleanser prior to applying a clean dressing using gauze sponges, not tissues or cotton balls. Do not scrub or use excessive force. Pat dry using gauze sponges, not tissue or cotton balls. ASHUR, GLATFELTER ([id number]) Peri-Wound Care Topical Primary Dressing Silvercel Small 2x2 (in/in) Discharge Instruction: Apply Silvercel Small 2x2 (in/in) as instructed Secondary Dressing ABD Pad 5x9 (in/in) Discharge Instruction: Cover with ABD pad Secured With Tubigrip Size E, 3.5x10 (in/yds) Discharge Instruction: Apply 3 Tubigrip E 3-finger-widths below knee to base of toes to secure dressing and/or for swelling. Compression Wrap Compression  Stockings Add-Ons Electronic Signature(s) Signed: 01/21/2022 3:54:37 PM By: Carlene Coria RN Entered By: Carlene Coria on 01/16/2022 09:23:40 [name], [name] ([id number]) -------------------------------------------------------------------------------- Wound Assessment Details Patient Name: [name] Ewing D. Date of Service: 01/16/2022 8:45 AM Medical Record Number: [id number] Patient Account Number: [id number] Date of Birth/Sex: 04/23/1941 (81 y.o. M) Treating RN: Carlene Coria Primary Care Lametria Klunk: [name] Other Clinician: Referring Danali Marinos: [name] Treating Raden Byington/Extender: [name] Weeks in Treatment: 0 Wound Status Wound Number: 2 Primary Etiology: Neuropathic Ulcer-Non Diabetic Wound Location: Right, Plantar Foot Wound Status: Open Wounding Event: Gradually Appeared Comorbid History: Congestive Heart Failure, Hypertension Date Acquired: 12/28/2020 Weeks Of Treatment: 0 Clustered Wound: No Photos Wound Measurements Length: (cm) 0.5 Width: (cm) 0.2 Depth: (cm) 0.3 Area: (cm) 0.079 Volume: (cm) 0.024 % Reduction in Area: % Reduction in Volume: Epithelialization: None Tunneling: No Undermining: No Wound Description Classification: Full Thickness Without Exposed Support Structures Exudate Amount: Medium Exudate Type: Serosanguineous Exudate Color: red, brown Foul Odor After Cleansing: No Slough/Fibrino Yes Wound Bed Granulation Amount: Medium (34-66%) Exposed Structure Granulation Quality: Red Fat Layer (Subcutaneous Tissue) Exposed: Yes Necrotic Amount: Medium (34-66%) Necrotic Quality: Adherent Slough Treatment Notes Wound #2 (Foot) Wound Laterality: Plantar, Right Cleanser Wound Cleanser Discharge Instruction: Wash your hands with soap and water. Remove old dressing, discard into plastic bag and place into trash. Cleanse the wound with Wound Cleanser prior to applying a clean dressing using gauze sponges, not tissues or cotton balls. Do  not scrub or use excessive force. Pat dry using gauze sponges, not tissue or cotton balls. Peri-Wound Care Topical Peloquin, Reyes D. ([id number]) Primary Dressing Silvercel Small 2x2 (in/in) Discharge Instruction: Apply Silvercel Small 2x2 (in/in) as instructed Secondary Dressing ABD Pad 5x9 (in/in) Discharge Instruction: Cover with ABD pad Secured With Tubigrip Size E, 3.5x10 (in/yds) Discharge Instruction: Apply 3 Tubigrip E 3-finger-widths below knee to base of toes to secure dressing and/or for swelling. Compression  Wrap Compression Stockings Add-Ons Electronic Signature(s) Signed: 01/16/2022 9:29:48 AM By: Carlene Coria RN Entered By: Carlene Coria on 01/16/2022 09:29:47 [name], [name] ([id number]) -------------------------------------------------------------------------------- Wound Assessment Details Patient Name: [name] Ewing D. Date of Service: 01/16/2022 8:45 AM Medical Record Number: [id number] Patient Account Number: [id number] Date of Birth/Sex: [birth year] (81 y.o. M) Treating RN: Carlene Coria Primary Care Alin Chavira: [name] Other Clinician: Referring Jia Dottavio: [name] Treating Ivann Trimarco/Extender: [name] Weeks in Treatment: 0 Wound Status Wound Number: 3 Primary Etiology: Neuropathic Ulcer-Non Diabetic Wound Location: Left, Plantar Foot Wound Status: Open Wounding Event: Gradually Appeared Comorbid History: Congestive Heart Failure, Hypertension Date Acquired: 12/28/2020 Weeks Of Treatment: 0 Clustered Wound: No Photos Wound Measurements Length: (cm) 0.3 Width: (cm) 0.3 Depth: (cm) 0.2 Area: (cm) 0.071 Volume: (cm) 0.014 % Reduction in Area: % Reduction in Volume: Tunneling: No Undermining: No Wound Description Classification: Full Thickness Without Exposed Support Structures Exudate Amount: Medium Exudate Type: Serosanguineous Exudate Color: red, brown Foul Odor After Cleansing: No Slough/Fibrino Yes Wound Bed Granulation Amount:  None Present (0%) Exposed Structure Necrotic Amount: Large (67-100%) Fascia Exposed: No Necrotic Quality: Adherent Slough Fat Layer (Subcutaneous Tissue) Exposed: No Tendon Exposed: No Muscle Exposed: No Joint Exposed: No Bone Exposed: No Treatment Notes Wound #3 (Foot) Wound Laterality: Plantar, Left Cleanser Wound Cleanser Discharge Instruction: Wash your hands with soap and water. Remove old dressing, discard into plastic bag and place into trash. Cleanse the wound with Wound Cleanser prior to applying a clean dressing using gauze sponges, not tissues or cotton balls. Do not scrub or use excessive force. Pat dry using gauze sponges, not tissue or cotton balls. GAREN, WOOLBRIGHT ([id number]) Peri-Wound Care Topical Primary Dressing Silvercel Small 2x2 (in/in) Discharge Instruction: Apply Silvercel Small 2x2 (in/in) as instructed Secondary Dressing ABD Pad 5x9 (in/in) Discharge Instruction: Cover with ABD pad Secured With Tubigrip Size E, 3.5x10 (in/yds) Discharge Instruction: Apply 3 Tubigrip E 3-finger-widths below knee to base of toes to secure dressing and/or for swelling. Compression Wrap Compression Stockings Add-Ons Electronic Signature(s) Signed: 01/16/2022 9:31:09 AM By: Carlene Coria RN Entered By: Carlene Coria on 01/16/2022 09:31:09 Orlov, Jaki D. ([id number]) -------------------------------------------------------------------------------- Wound Assessment Details Patient Name: [name] Ewing D. Date of Service: 01/16/2022 8:45 AM Medical Record Number: [id number] Patient Account Number: [id number] Date of Birth/Sex: 07-09-1940 (81 y.o. M) Treating RN: Carlene Coria Primary Care Keslee Harrington: [name] Other Clinician: Referring Paz Winsett: [name] Treating Antonella Upson/Extender: [name] Weeks in Treatment: 0 Wound Status Wound Number: 4 Primary Etiology: Venous Leg Ulcer Wound Location: Left, Anterior Lower Leg Wound Status: Open Wounding Event:  Gradually Appeared Comorbid History: Congestive Heart Failure, Hypertension Date Acquired: 06/30/2021 Weeks Of Treatment: 0 Clustered Wound: No Photos Wound Measurements Length: (cm) 1.2 Width: (cm) 0.8 Depth: (cm) 0.1 Area: (cm) 0.754 Volume: (cm) 0.075 % Reduction in Area: % Reduction in Volume: Epithelialization: None Tunneling: No Undermining: No Wound Description Classification: Full Thickness Without Exposed Support Structures Exudate Amount: Medium Exudate Type: Serosanguineous Exudate Color: red, brown Foul Odor After Cleansing: No Slough/Fibrino Yes Wound Bed Granulation Amount: None Present (0%) Exposed Structure Necrotic Amount: Large (67-100%) Fascia Exposed: No Necrotic Quality: Adherent Slough Fat Layer (Subcutaneous Tissue) Exposed: No Tendon Exposed: No Muscle Exposed: No Joint Exposed: No Bone Exposed: No Treatment Notes Wound #4 (Lower Leg) Wound Laterality: Left, Anterior Cleanser Wound Cleanser Discharge Instruction: Wash your hands with soap and water. Remove old dressing, discard into plastic bag and place into trash. Cleanse the wound with Wound Cleanser prior to applying a clean dressing  using gauze sponges, not tissues or cotton balls. Do not scrub or use excessive force. Pat dry using gauze sponges, not tissue or cotton balls. AVETT, REINECK ([id number]) Peri-Wound Care Topical Primary Dressing Silvercel Small 2x2 (in/in) Discharge Instruction: Apply Silvercel Small 2x2 (in/in) as instructed Secondary Dressing ABD Pad 5x9 (in/in) Discharge Instruction: Cover with ABD pad Secured With Tubigrip Size E, 3.5x10 (in/yds) Discharge Instruction: Apply 3 Tubigrip E 3-finger-widths below knee to base of toes to secure dressing and/or for swelling. Compression Wrap Compression Stockings Add-Ons Electronic Signature(s) Signed: 01/16/2022 9:32:38 AM By: Carlene Coria RN Entered By: Carlene Coria on 01/16/2022 09:32:38 Kimoto, Gurkaran D.  ([id number]) -------------------------------------------------------------------------------- Vitals Details Patient Name: [name] Ewing D. Date of Service: 01/16/2022 8:45 AM Medical Record Number: [id number] Patient Account Number: [id number] Date of Birth/Sex: December 03, 1940 (81 y.o. M) Treating RN: Carlene Coria Primary Care Seri Kimmer: [name] Other Clinician: Referring Shenica Holzheimer: [name] Treating Contessa Preuss/Extender: Skipper Cliche in Treatment: 0 Vital Signs Time Taken: 09:00 Temperature (F): 97.7 Height (in): 74 Pulse (bpm): 56 Source: Stated Respiratory Rate (breaths/min): 18 Weight (lbs): 244 Blood Pressure (mmHg): 123/62 Source: Stated Reference Range: 80 - 120 mg / dl Body Mass Index (BMI): 31.3 Electronic Signature(s) Signed: 01/21/2022 3:54:37 PM By: Carlene Coria RN Entered By: Carlene Coria on 01/16/2022 09:00:37

## 2022-01-22 ENCOUNTER — Telehealth (INDEPENDENT_AMBULATORY_CARE_PROVIDER_SITE_OTHER): Payer: Self-pay

## 2022-01-22 ENCOUNTER — Other Ambulatory Visit (INDEPENDENT_AMBULATORY_CARE_PROVIDER_SITE_OTHER): Payer: Self-pay | Admitting: Physician Assistant

## 2022-01-22 DIAGNOSIS — S91309S Unspecified open wound, unspecified foot, sequela: Secondary | ICD-10-CM

## 2022-01-22 NOTE — Telephone Encounter (Signed)
Spoke with pt regarding scheduling of [place] that Dr. [name] ordered. He states that he does not want to have to pay for something that insurance will not pay for. I explained that the [place] he had in the hospital was a DVT and the ones that Dr. Joaquim Lai ordered were different. He then stated that someone from the hospital was supposed to be calling him back from the hospital regarding approval of [place]. I advised that I would wait on him to call me back and see where we are with authorization. Pt acknowledged and nothing further is needed at this time.   While typing this note, pt called to schedule. He was scheduled but wants a call back to make sure it is authorized. While I was still typing this note, he called back to see if I had gotten an authorization. I explained that per our last conversation, he was going to call back after he had spoken to the hospital. He states that "nobody has called me back yet". I again offered to call insurance and he states that he will call his insurance and call me back. We will keep [place] appt scheduled for 01/24/22 but may have to R/S if needed. I will await pt's phone call back regarding authorization.

## 2022-01-23 ENCOUNTER — Encounter: Payer: PPO | Admitting: Internal Medicine

## 2022-01-23 DIAGNOSIS — L97812 Non-pressure chronic ulcer of other part of right lower leg with fat layer exposed: Secondary | ICD-10-CM | POA: Diagnosis not present

## 2022-01-23 DIAGNOSIS — L97822 Non-pressure chronic ulcer of other part of left lower leg with fat layer exposed: Secondary | ICD-10-CM | POA: Diagnosis not present

## 2022-01-23 DIAGNOSIS — E11621 Type 2 diabetes mellitus with foot ulcer: Secondary | ICD-10-CM | POA: Diagnosis not present

## 2022-01-23 DIAGNOSIS — I87333 Chronic venous hypertension (idiopathic) with ulcer and inflammation of bilateral lower extremity: Secondary | ICD-10-CM | POA: Diagnosis not present

## 2022-01-23 NOTE — Progress Notes (Signed)
[name], [name] ([id number]) Visit Report for 01/23/2022 HPI Details Patient Name: [name], [name]. Date of Service: 01/23/2022 8:00 AM Medical Record Number: [id number] Patient Account Number: [id number] Date of Birth/Sex: 01/12/1941 (81 y.o. M) Treating RN: Carlene Coria Primary Care Provider: [name] Other Clinician: Referring Provider: [name] Treating Provider/Extender: Tito Dine in Treatment: 1 History of Present Illness HPI Description: 01-16-2022 upon evaluation today patient presents for initial inspection here in the clinic concerning issues that he has been having with wounds over the bilateral lower extremities and bilateral feet. Fortunately there does not appear to be any signs of significant infection at this point which is great news. Unfortunately his ABIs were registering at 0.49 on the left and 0.57 on the right here in the clinic on the screening today. With that being said I do think that it may possibly be true that we need to get him to formal arterial studies in order to see how things really are showing up. And the patient's not in disagreement with this for that reason we are going to make that referral as well. With that being said he does have some significant past medical history items of note detailed below. Patient does have chronic kidney disease stage IIIb, coronary artery disease, long-term use of anticoagulant therapy, dilated cardiomyopathy. Proximal atrial fibrillation, he is on Xarelto long-term. He also has congestive heart failure, hypertension, peripheral neuropathy not related to diabetes as he is only prediabetic with a hemoglobin A1c of 5.8, and chronic venous hypertension. He has recently seen Dr. [name] show his cardiologist who did discontinue the hydrochlorothiazide and the Lasix and felt that the patient was retaining more fluid than he should be. For that reason he was started on furosemide 20 mg twice daily. 7/27; patient  with bilateral lower extremity wounds. These are largely venous although he had very poor ABIs on screening test here. He has an appointment [facility] studies. He was not too keen on the latter although I think I emphasized the need to do this both in terms of dealing with the current wounds and being prepared for further issues down the road. We have been using silver alginate on the wounds and Tubigrip's edema control is better per our intake nurse Electronic Signature(s) Signed: 01/23/2022 4:27:18 PM By: Linton Ham MD Entered By: Linton Ham on 01/23/2022 08:37:12 Reveron, Rheems. ([id number]) -------------------------------------------------------------------------------- Physical Exam Details Patient Name: [name] Ewing D. Date of Service: 01/23/2022 8:00 AM Medical Record Number: [id number] Patient Account Number: [id number] Date of Birth/Sex: [birth year] (81 y.o. M) Treating RN: Carlene Coria Primary Care Provider: [name] Other Clinician: Referring Provider: [name] Treating Provider/Extender: Tito Dine in Treatment: 1 Constitutional Sitting or standing Blood Pressure is within target range for patient. 135/66. appears in no distress. Respiratory Respiratory effort is easy and symmetric bilaterally. Rate is normal at rest and on room air.. Bilateral breath sounds are clear and equal in all lobes with no wheezes, rales or rhonchi.. Cardiovascular No murmurs no gallops. Jugular venous pressure is not elevated no sacral edema. Pedal pulses absentOn both sides.. Notes Wound exam; he has wounds on his bilateral lower legs I think these are all better than last week nothing looks ominous. He does have 2+ pitting edema in the lower extremities skin changes of chronic venous insufficiency Electronic Signature(s) Signed: 01/23/2022 4:27:18 PM By: Linton Ham MD Entered By: Linton Ham on 01/23/2022  08:39:51 Degrasse, Fran D. ([id number]) --------------------------------------------------------------------------------  Physician Orders Details Patient Name: [name], [name]. Date of Service: 01/23/2022 8:00 AM Medical Record Number: [id number] Patient Account Number: [id number] Date of Birth/Sex: 1940/09/15 (81 y.o. M) Treating RN: Carlene Coria Primary Care Provider: [name] Other Clinician: Referring Provider: [name] Treating Provider/Extender: Tito Dine in Treatment: 1 Verbal / Phone Orders: No Diagnosis Coding Follow-up Appointments o Return Appointment in 1 week. Bathing/ Shower/ Hygiene o May shower with wound dressing protected with water repellent cover or cast protector. Edema Control - Lymphedema / Segmental Compressive Device / Other o Elevate, Exercise Daily and Avoid Standing for Long Periods of Time. o Elevate legs to the level of the heart and pump ankles as often as possible o Elevate leg(s) parallel to the floor when sitting. Wound Treatment Wound #1 - Lower Leg Wound Laterality: Right, Lateral Cleanser: Wound Cleanser 1 x Per Week/30 Days Discharge Instructions: Wash your hands with soap and water. Remove old dressing, discard into plastic bag and place into trash. Cleanse the wound with Wound Cleanser prior to applying a clean dressing using gauze sponges, not tissues or cotton balls. Do not scrub or use excessive force. Pat dry using gauze sponges, not tissue or cotton balls. Primary Dressing: Silvercel Small 2x2 (in/in) 1 x Per Week/30 Days Discharge Instructions: Apply Silvercel Small 2x2 (in/in) as instructed Secondary Dressing: ABD Pad 5x9 (in/in) 1 x Per Week/30 Days Discharge Instructions: Cover with ABD pad Secondary Dressing: Gauze 1 x Per Week/30 Days Discharge Instructions: As directed: dry, moistened with saline or moistened with Dakins Solution Secured With: Medipore Tape - 14M Medipore H Soft Cloth Surgical Tape,  2x2 (in/yd) 1 x Per Week/30 Days Secured With: Tubigrip Size E, 3.5x10 (in/yds) 1 x Per Week/30 Days Discharge Instructions: Apply 3 Tubigrip E 3-finger-widths below knee to base of toes to secure dressing and/or for swelling. Wound #2 - Foot Wound Laterality: Plantar, Right Cleanser: Wound Cleanser 1 x Per Week/30 Days Discharge Instructions: Wash your hands with soap and water. Remove old dressing, discard into plastic bag and place into trash. Cleanse the wound with Wound Cleanser prior to applying a clean dressing using gauze sponges, not tissues or cotton balls. Do not scrub or use excessive force. Pat dry using gauze sponges, not tissue or cotton balls. Primary Dressing: Silvercel Small 2x2 (in/in) 1 x Per Week/30 Days Discharge Instructions: Apply Silvercel Small 2x2 (in/in) as instructed Secondary Dressing: Gauze 1 x Per Week/30 Days Discharge Instructions: As directed: dry, moistened with saline or moistened with Dakins Solution Secured With: Medipore Tape - 14M Medipore H Soft Cloth Surgical Tape, 2x2 (in/yd) 1 x Per Week/30 Days Secured With: Tubigrip Size E, 3.5x10 (in/yds) 1 x Per Week/30 Days Discharge Instructions: Apply 3 Tubigrip E 3-finger-widths below knee to base of toes to secure dressing and/or for swelling. Wound #3 - Foot Wound Laterality: Plantar, Left Cleanser: Wound Cleanser 1 x Per Week/30 Days Discharge Instructions: Wash your hands with soap and water. Remove old dressing, discard into plastic bag and place into trash. Cleanse the wound with Wound Cleanser prior to applying a clean dressing using gauze sponges, not tissues or cotton balls. Do not scrub or use excessive force. Pat dry using gauze sponges, not tissue or cotton balls. KARANDEEP, RESENDE ([id number]) Primary Dressing: Silvercel Small 2x2 (in/in) 1 x Per Week/30 Days Discharge Instructions: Apply Silvercel Small 2x2 (in/in) as instructed Secondary Dressing: Gauze 1 x Per Week/30 Days Discharge  Instructions: As directed: dry, moistened with saline or moistened with Dakins Solution  Secured With: Medipore Tape - 50M Medipore H Soft Cloth Surgical Tape, 2x2 (in/yd) 1 x Per Week/30 Days Secured With: Tubigrip Size E, 3.5x10 (in/yds) 1 x Per Week/30 Days Discharge Instructions: Apply 3 Tubigrip E 3-finger-widths below knee to base of toes to secure dressing and/or for swelling. Wound #4 - Lower Leg Wound Laterality: Left, Anterior Cleanser: Wound Cleanser 1 x Per Week/30 Days Discharge Instructions: Wash your hands with soap and water. Remove old dressing, discard into plastic bag and place into trash. Cleanse the wound with Wound Cleanser prior to applying a clean dressing using gauze sponges, not tissues or cotton balls. Do not scrub or use excessive force. Pat dry using gauze sponges, not tissue or cotton balls. Primary Dressing: Silvercel Small 2x2 (in/in) 1 x Per Week/30 Days Discharge Instructions: Apply Silvercel Small 2x2 (in/in) as instructed Secondary Dressing: ABD Pad 5x9 (in/in) 1 x Per Week/30 Days Discharge Instructions: Cover with ABD pad Secondary Dressing: Gauze 1 x Per Week/30 Days Discharge Instructions: As directed: dry, moistened with saline or moistened with Dakins Solution Secured With: Medipore Tape - 50M Medipore H Soft Cloth Surgical Tape, 2x2 (in/yd) 1 x Per Week/30 Days Secured With: Tubigrip Size E, 3.5x10 (in/yds) 1 x Per Week/30 Days Discharge Instructions: Apply 3 Tubigrip E 3-finger-widths below knee to base of toes to secure dressing and/or for swelling. Electronic Signature(s) Signed: 01/23/2022 4:27:18 PM By: Linton Ham MD Signed: 01/23/2022 4:52:51 PM By: Carlene Coria RN Entered By: Carlene Coria on 01/23/2022 08:22:54 Ledee, Yoshua D. ([id number]) -------------------------------------------------------------------------------- Problem List Details Patient Name: SAYRE, WITHERINGTON D. Date of Service: 01/23/2022 8:00 AM Medical Record Number:  [id number] Patient Account Number: [id number] Date of Birth/Sex: Jan 04, 1941 (81 y.o. M) Treating RN: Carlene Coria Primary Care Provider: [name] Other Clinician: Referring Provider: [name] Treating Provider/Extender: Tito Dine in Treatment: 1 Active Problems ICD-10 Encounter Code Description Active Date MDM Diagnosis I87.333 Chronic venous hypertension (idiopathic) with ulcer and inflammation of 01/16/2022 No Yes bilateral lower extremity L97.812 Non-pressure chronic ulcer of other part of right lower leg with fat layer 01/16/2022 No Yes exposed L97.822 Non-pressure chronic ulcer of other part of left lower leg with fat layer 01/16/2022 No Yes exposed G90.09 Other idiopathic peripheral autonomic neuropathy 01/16/2022 No Yes L97.522 Non-pressure chronic ulcer of other part of left foot with fat layer 01/16/2022 No Yes exposed L97.512 Non-pressure chronic ulcer of other part of right foot with fat layer 01/16/2022 No Yes exposed Kincaid (primary) hypertension 01/16/2022 No Yes I50.42 Chronic combined systolic (congestive) and diastolic (congestive) heart 01/16/2022 No Yes failure I48.0 Paroxysmal atrial fibrillation 01/16/2022 No Yes I42.0 Dilated cardiomyopathy 01/16/2022 No Yes Z79.01 Long term (current) use of anticoagulants 01/16/2022 No Yes I25.10 Atherosclerotic heart disease of native coronary artery without angina 01/16/2022 No Yes pectoris Brindley, Reinhardt D. ([id number]) N18.32 Chronic kidney disease, stage 3b 01/16/2022 No Yes Inactive Problems Resolved Problems Electronic Signature(s) Signed: 01/23/2022 4:27:18 PM By: Linton Ham MD Entered By: Linton Ham on 01/23/2022 08:36:11 Camper, Shreyansh D. ([id number]) -------------------------------------------------------------------------------- Progress Note Details Patient Name: [name] Ewing D. Date of Service: 01/23/2022 8:00 AM Medical Record Number: [id number] Patient Account Number:  [id number] Date of Birth/Sex: [birth year] (81 y.o. M) Treating RN: Carlene Coria Primary Care Provider: [name] Other Clinician: Referring Provider: [name] Treating Provider/Extender: Tito Dine in Treatment: 1 Subjective History of Present Illness (HPI) 01-16-2022 upon evaluation today patient presents for initial inspection here in the clinic concerning issues that he has been having with wounds over  the bilateral lower extremities and bilateral feet. Fortunately there does not appear to be any signs of significant infection at this point which is great news. Unfortunately his ABIs were registering at 0.49 on the left and 0.57 on the right here in the clinic on the screening today. With that being said I do think that it may possibly be true that we need to get him to formal arterial studies in order to see how things really are showing up. And the patient's not in disagreement with this for that reason we are going to make that referral as well. With that being said he does have some significant past medical history items of note detailed below. Patient does have chronic kidney disease stage IIIb, coronary artery disease, long-term use of anticoagulant therapy, dilated cardiomyopathy. Proximal atrial fibrillation, he is on Xarelto long-term. He also has congestive heart failure, hypertension, peripheral neuropathy not related to diabetes as he is only prediabetic with a hemoglobin A1c of 5.8, and chronic venous hypertension. He has recently seen Dr. [name] show his cardiologist who did discontinue the hydrochlorothiazide and the Lasix and felt that the patient was retaining more fluid than he should be. For that reason he was started on furosemide 20 mg twice daily. 7/27; patient with bilateral lower extremity wounds. These are largely venous although he had very poor ABIs on screening test here. He has an [facility]  studies. He was not too keen on the latter although I think I emphasized the need to do this both in terms of dealing with the current wounds and being prepared for further issues down the road. We have been using silver alginate on the wounds and Tubigrip's edema control is better per our intake nurse Objective Constitutional Sitting or standing Blood Pressure is within target range for patient. 135/66. appears in no distress. Vitals Time Taken: 8:07 AM, Height: 74 in, Weight: 244 lbs, BMI: 31.3, Temperature: 97.9 F, Pulse: 59 bpm, Respiratory Rate: 18 breaths/min, Blood Pressure: 135/66 mmHg. Respiratory Respiratory effort is easy and symmetric bilaterally. Rate is normal at rest and on room air.. Bilateral breath sounds are clear and equal in all lobes with no wheezes, rales or rhonchi.. Cardiovascular No murmurs no gallops. Jugular venous pressure is not elevated no sacral edema. Pedal pulses absentOn both sides.. General Notes: Wound exam; he has wounds on his bilateral lower legs I think these are all better than last week nothing looks ominous. He does have 2+ pitting edema in the lower extremities skin changes of chronic venous insufficiency Integumentary (Hair, Skin) Wound #1 status is Open. Original cause of wound was Gradually Appeared. The date acquired was: 11/28/2021. The wound has been in treatment 1 weeks. The wound is located on the Right,Lateral Lower Leg. The wound measures 5cm length x 2.5cm width x 0.1cm depth; 9.817cm^2 area and 0.982cm^3 volume. There is Fat Layer (Subcutaneous Tissue) exposed. There is no tunneling or undermining noted. There is a medium amount of serosanguineous drainage noted. There is large (67-100%) red, pink granulation within the wound bed. There is a small (1-33%) amount of necrotic tissue within the wound bed including Adherent Slough. Wound #2 status is Open. Original cause of wound was Gradually Appeared. The date acquired was: 12/28/2020. The  wound has been in treatment 1 weeks. The wound is located on the [place]. The wound measures 0.4cm length x 0.1cm width x 0.3cm depth; 0.031cm^2 area and 0.009cm^3 volume. There is Fat Layer (Subcutaneous  Tissue) exposed. There is no tunneling or undermining noted. There is a medium amount of serosanguineous drainage noted. There is medium (34-66%) red granulation within the wound bed. There is a medium (34-66%) amount of necrotic tissue within the wound bed including Adherent Slough. Wound #3 status is Open. Original cause of wound was Gradually Appeared. The date acquired was: 12/28/2020. The wound has been in treatment 1 weeks. The wound is located on the [place]. The wound measures 0.2cm length x 0.1cm width x 0.2cm depth; 0.016cm^2 area and 0.003cm^3 volume. There is no tunneling or undermining noted. There is a medium amount of serosanguineous drainage noted. There is no Barclift, Laureano D. ([id number]) granulation within the wound bed. There is a large (67-100%) amount of necrotic tissue within the wound bed including Adherent Slough. Wound #4 status is Open. Original cause of wound was Gradually Appeared. The date acquired was: 06/30/2021. The wound has been in treatment 1 weeks. The wound is located on the Left,Anterior Lower Leg. The wound measures 0.5cm length x 0.2cm width x 0.1cm depth; 0.079cm^2 area and 0.008cm^3 volume. There is no tunneling or undermining noted. There is a medium amount of serosanguineous drainage noted. There is no granulation within the wound bed. There is a large (67-100%) amount of necrotic tissue within the wound bed including Adherent Slough. Assessment Active Problems ICD-10 Chronic venous hypertension (idiopathic) with ulcer and inflammation of bilateral lower extremity Non-pressure chronic ulcer of other part of right lower leg with fat layer exposed Non-pressure chronic ulcer of other part of left lower leg with fat layer exposed Other  idiopathic peripheral autonomic neuropathy Non-pressure chronic ulcer of other part of left foot with fat layer exposed Non-pressure chronic ulcer of other part of right foot with fat layer exposed Essential (primary) hypertension Chronic combined systolic (congestive) and diastolic (congestive) heart failure Paroxysmal atrial fibrillation Dilated cardiomyopathy Long term (current) use of anticoagulants Atherosclerotic heart disease 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 Bathing/ Shower/ Hygiene: May shower with wound dressing protected with water repellent cover or cast protector. Edema Control - Lymphedema / Segmental Compressive Device / Other: Elevate, Exercise Daily and Avoid Standing for Long Periods of Time. Elevate legs to the level of the heart and pump ankles as often as possible Elevate leg(s) parallel to the floor when sitting. WOUND #1: - Lower Leg Wound Laterality: Right, Lateral Cleanser: Wound Cleanser 1 x Per Week/30 Days Discharge Instructions: Wash your hands with soap and water. Remove old dressing, discard into plastic bag and place into trash. Cleanse the wound with Wound Cleanser prior to applying a clean dressing using gauze sponges, not tissues or cotton balls. Do not scrub or use excessive force. Pat dry using gauze sponges, not tissue or cotton balls. Primary Dressing: Silvercel Small 2x2 (in/in) 1 x Per Week/30 Days Discharge Instructions: Apply Silvercel Small 2x2 (in/in) as instructed Secondary Dressing: ABD Pad 5x9 (in/in) 1 x Per Week/30 Days Discharge Instructions: Cover with ABD pad Secondary Dressing: Gauze 1 x Per Week/30 Days Discharge Instructions: As directed: dry, moistened with saline or moistened with Dakins Solution Secured With: Medipore Tape - 33M Medipore H Soft Cloth Surgical Tape, 2x2 (in/yd) 1 x Per Week/30 Days Secured With: Tubigrip  Size E, 3.5x10 (in/yds) 1 x Per Week/30 Days Discharge Instructions: Apply 3 Tubigrip E 3-finger-widths below knee to base of toes to secure dressing and/or for swelling. WOUND #2: - Foot Wound Laterality: Plantar, Right  Cleanser: Wound Cleanser 1 x Per Week/30 Days Discharge Instructions: Wash your hands with soap and water. Remove old dressing, discard into plastic bag and place into trash. Cleanse the wound with Wound Cleanser prior to applying a clean dressing using gauze sponges, not tissues or cotton balls. Do not scrub or use excessive force. Pat dry using gauze sponges, not tissue or cotton balls. Primary Dressing: Silvercel Small 2x2 (in/in) 1 x Per Week/30 Days Discharge Instructions: Apply Silvercel Small 2x2 (in/in) as instructed Secondary Dressing: Gauze 1 x Per Week/30 Days Discharge Instructions: As directed: dry, moistened with saline or moistened with Dakins Solution Secured With: Medipore Tape - 48M Medipore H Soft Cloth Surgical Tape, 2x2 (in/yd) 1 x Per Week/30 Days Secured With: Tubigrip Size E, 3.5x10 (in/yds) 1 x Per Week/30 Days Discharge Instructions: Apply 3 Tubigrip E 3-finger-widths below knee to base of toes to secure dressing and/or for swelling. WOUND #3: - Foot Wound Laterality: Plantar, Left Cleanser: Wound Cleanser 1 x Per Week/30 Days Discharge Instructions: Wash your hands with soap and water. Remove old dressing, discard into plastic bag and place into trash. Cleanse the wound with Wound Cleanser prior to applying a clean dressing using gauze sponges, not tissues or cotton balls. Do not scrub or use excessive force. Pat dry using gauze sponges, not tissue or cotton balls. Primary Dressing: Silvercel Small 2x2 (in/in) 1 x Per Week/30 Days Discharge Instructions: Apply Silvercel Small 2x2 (in/in) as instructed Ola, Kaya D. ([id number]) Secondary Dressing: Gauze 1 x Per Week/30 Days Discharge Instructions: As directed: dry, moistened with saline or  moistened with Dakins Solution Secured With: Medipore Tape - 48M Medipore H Soft Cloth Surgical Tape, 2x2 (in/yd) 1 x Per Week/30 Days Secured With: Tubigrip Size E, 3.5x10 (in/yds) 1 x Per Week/30 Days Discharge Instructions: Apply 3 Tubigrip E 3-finger-widths below knee to base of toes to secure dressing and/or for swelling. WOUND #4: - Lower Leg Wound Laterality: Left, Anterior Cleanser: Wound Cleanser 1 x Per Week/30 Days Discharge Instructions: Wash your hands with soap and water. Remove old dressing, discard into plastic bag and place into trash. Cleanse the wound with Wound Cleanser prior to applying a clean dressing using gauze sponges, not tissues or cotton balls. Do not scrub or use excessive force. Pat dry using gauze sponges, not tissue or cotton balls. Primary Dressing: Silvercel Small 2x2 (in/in) 1 x Per Week/30 Days Discharge Instructions: Apply Silvercel Small 2x2 (in/in) as instructed Secondary Dressing: ABD Pad 5x9 (in/in) 1 x Per Week/30 Days Discharge Instructions: Cover with ABD pad Secondary Dressing: Gauze 1 x Per Week/30 Days Discharge Instructions: As directed: dry, moistened with saline or moistened with Dakins Solution Secured With: Medipore Tape - 48M Medipore H Soft Cloth Surgical Tape, 2x2 (in/yd) 1 x Per Week/30 Days Secured With: Tubigrip Size E, 3.5x10 (in/yds) 1 x Per Week/30 Days Discharge Instructions: Apply 3 Tubigrip E 3-finger-widths below knee to base of toes to secure dressing and/or for swelling. 1. I did not change the primary dressing which is silver alginate Tubigrip. His edema control is better per our intake nurse 2. I believe patient is booked for arterial studies tomorrow and I emphasized the need to do this both for current issues and possibly future issues 3. The patient has a nonischemic cardiomyopathy with an ejection fraction of 40 to 45% I see no evidence of congestive heart failure at the bedside I think all of his edema is related to  chronic venous insufficiency. The crawler of this is that  he is likely to need stockings going forward Electronic Signature(s) Signed: 01/23/2022 4:27:18 PM By: Linton Ham MD Entered By: Linton Ham on 01/23/2022 08:40:50 Parslow, Tiran D. ([id number]) -------------------------------------------------------------------------------- SuperBill Details Patient Name: [name] Ewing D. Date of Service: 01/23/2022 Medical Record Number: [id number] Patient Account Number: [id number] Date of Birth/Sex: [birth year] (81 y.o. M) Treating RN: Carlene Coria Primary Care Provider: [name] Other Clinician: Referring Provider: [name] Treating Provider/Extender: Tito Dine in Treatment: 1 Diagnosis Coding ICD-10 Codes Code Description [phone] Chronic venous hypertension (idiopathic) with ulcer and inflammation of bilateral lower extremity L97.812 Non-pressure chronic ulcer of other part of right lower leg with fat layer exposed L97.822 Non-pressure chronic ulcer of other part of left lower leg with fat layer exposed G90.09 Other idiopathic peripheral autonomic neuropathy L97.522 Non-pressure chronic ulcer of other part of left foot with fat layer exposed L97.512 Non-pressure chronic ulcer of other part of right foot with fat layer exposed I10 Essential (primary) hypertension I50.42 Chronic combined systolic (congestive) and diastolic (congestive) heart failure I48.0 Paroxysmal atrial fibrillation I42.0 Dilated cardiomyopathy Z79.01 Long term (current) use of anticoagulants I25.10 Atherosclerotic heart disease of native coronary artery without angina pectoris N18.32 Chronic kidney disease, stage 3b Facility Procedures CPT4 Code: [id number] Description: 99214 - WOUND CARE VISIT-LEV 4 EST PT Modifier: Quantity: 1 Physician Procedures CPT4 Code Description: [id number] 99214 - WC PHYS LEVEL 4 - EST PT Modifier: Quantity: 1 CPT4 Code Description: ICD-10 Diagnosis Description  L97.812 Non-pressure chronic ulcer of other part of right lower leg with fat lay L97.822 Non-pressure chronic ulcer of other part of left lower leg with fat laye I50.42 Chronic combined systolic  (congestive) and diastolic (congestive) heart I87.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

## 2022-01-23 NOTE — Progress Notes (Signed)
[name] ([id number]) Visit Report for 01/23/2022 Arrival Information Details Patient Name: [name], [name]. Date of Service: 01/23/2022 8:00 AM Medical Record Number: [id number] Patient Account Number: [id number] Date of Birth/Sex: [birth year] (81 y.o. M) Treating RN: [name] Primary Care [name]: [name] Other Clinician: Referring [name]: [name] Treating Nansi Birmingham/Extender: [name] in Treatment: 1 Visit Information History Since Last Visit All ordered tests and consults were completed: No Patient Arrived: [name] Added or deleted any medications: No Arrival Time: 08:04 Any new allergies or adverse reactions: No Accompanied By: self Had a fall or experienced change in No Transfer Assistance: None activities of daily living that may affect Patient Identification Verified: Yes risk of falls: Secondary Verification Process Completed: Yes Signs or symptoms of abuse/neglect since last visito No Patient Requires Transmission-Based Precautions: No Hospitalized since last visit: No Patient Has Alerts: No Implantable device outside of the clinic excluding No cellular tissue based products placed in the center since last visit: Has Dressing in Place as Prescribed: Yes Has Compression in Place as Prescribed: Yes Pain Present Now: No Electronic Signature(s) Signed: 01/23/2022 4:52:51 PM By: [name] Coria RN Entered By: [name] on 01/23/2022 08:07:39 [name], [name] ([id number]) -------------------------------------------------------------------------------- Clinic Level of Care Assessment Details Patient Name: [name], [name] Date of Service: 01/23/2022 8:00 AM Medical Record Number: [id number] Patient Account Number: [id number] Date of Birth/Sex: Oct 17, 1940 (81 y.o. M) Treating RN: [name] Primary Care Lasya Vetter: [name] Other Clinician: Referring Randall Rampersad: [name] Treating Trystyn Dolley/Extender: [name] in  Treatment: 1 Clinic Level of Care Assessment Items TOOL 4 Quantity Score X - Use when only an EandM is performed on FOLLOW-UP visit 1 0 ASSESSMENTS - Nursing Assessment / Reassessment X - Reassessment of Co-morbidities (includes updates in patient status) 1 10 X- 1 5 Reassessment of Adherence to Treatment Plan ASSESSMENTS - Wound and Skin Assessment / Reassessment []  - Simple Wound Assessment / Reassessment - one wound 0 X- 4 5 Complex Wound Assessment / Reassessment - multiple wounds []  - 0 Dermatologic / Skin Assessment (not related to wound area) ASSESSMENTS - Focused Assessment []  - Circumferential Edema Measurements - multi extremities 0 []  - 0 Nutritional Assessment / Counseling / Intervention []  - 0 Lower Extremity Assessment (monofilament, tuning fork, pulses) []  - 0 Peripheral Arterial Disease Assessment (using hand held doppler) ASSESSMENTS - Ostomy and/or Continence Assessment and Care []  - Incontinence Assessment and Management 0 []  - 0 Ostomy Care Assessment and Management (repouching, etc.) PROCESS - Coordination of Care X - Simple Patient / Family Education for ongoing care 1 15 []  - 0 Complex (extensive) Patient / Family Education for ongoing care []  - 0 Staff obtains Programmer, systems, Records, Test Results / Process Orders []  - 0 Staff telephones HHA, Nursing Homes / Clarify orders / etc []  - 0 Routine Transfer to another Facility (non-emergent condition) []  - 0 Routine Hospital Admission (non-emergent condition) []  - 0 New Admissions / Biomedical engineer / Ordering NPWT, Apligraf, etc. []  - 0 Emergency Hospital Admission (emergent condition) X- 1 10 Simple Discharge Coordination []  - 0 Complex (extensive) Discharge Coordination PROCESS - Special Needs []  - Pediatric / Minor Patient Management 0 []  - 0 Isolation Patient Management []  - 0 Hearing / Language / Visual special needs []  - 0 Assessment of Community assistance (transportation, D/C  planning, etc.) []  - 0 Additional assistance / Altered mentation []  - 0 Support Surface(s) Assessment (bed, cushion, seat, etc.) INTERVENTIONS - Wound Cleansing / Measurement [name], [name] (  [id number]) []  - 0 Simple Wound Cleansing - one wound X- 4 5 Complex Wound Cleansing - multiple wounds X- 1 5 Wound Imaging (photographs - any number of wounds) []  - 0 Wound Tracing (instead of photographs) []  - 0 Simple Wound Measurement - one wound X- 4 5 Complex Wound Measurement - multiple wounds INTERVENTIONS - Wound Dressings X - Small Wound Dressing one or multiple wounds 4 10 []  - 0 Medium Wound Dressing one or multiple wounds []  - 0 Large Wound Dressing one or multiple wounds []  - 0 Application of Medications - topical []  - 0 Application of Medications - injection INTERVENTIONS - Miscellaneous []  - External ear exam 0 []  - 0 Specimen Collection (cultures, biopsies, blood, body fluids, etc.) []  - 0 Specimen(s) / Culture(s) sent or taken to Lab for analysis []  - 0 Patient Transfer (multiple staff / Civil Service fast streamer / Similar devices) []  - 0 Simple Staple / Suture removal (25 or less) []  - 0 Complex Staple / Suture removal (26 or more) []  - 0 Hypo / Hyperglycemic Management (close monitor of Blood Glucose) []  - 0 Ankle / Brachial Index (ABI) - do not check if billed separately X- 1 5 Vital Signs Has the patient been seen at the hospital within the last three years: Yes Total Score: 150 Level Of Care: New/Established - Level 4 Electronic Signature(s) Signed: 01/23/2022 4:52:51 PM By: [name] Coria RN Entered By: [name] on 01/23/2022 08:24:18 [name], [name] ([id number]) -------------------------------------------------------------------------------- Encounter Discharge Information Details Patient Name: [name] Ewing D. Date of Service: 01/23/2022 8:00 AM Medical Record Number: [id number] Patient Account Number: [id number] Date of Birth/Sex: 1941/06/01 (81 y.o.  M) Treating RN: [name] Primary Care Marcele Kosta: [name] Other Clinician: Referring Jorgia Manthei: [name] Treating Juno Alers/Extender: [name] in Treatment: 1 Encounter Discharge Information Items Discharge Condition: Stable Ambulatory Status: Walker Discharge Destination: Home Transportation: Private Auto Accompanied By: self Schedule Follow-up Appointment: Yes Clinical Summary of Care: Electronic Signature(s) Signed: 01/23/2022 4:52:51 PM By: [name] Coria RN Entered By: [name] on 01/23/2022 08:25:22 Girdler, [name] ([id number]) -------------------------------------------------------------------------------- Lower Extremity Assessment Details Patient Name: [name] Ewing D. Date of Service: 01/23/2022 8:00 AM Medical Record Number: [id number] Patient Account Number: [id number] Date of Birth/Sex: [birth year] (81 y.o. M) Treating RN: [name] Primary Care Najmah Carradine: [name] Other Clinician: Referring Andria Head: [name] Treating Shima Compere/Extender: [name] in Treatment: 1 Edema Assessment Assessed: [Left: No] [Right: No] Edema: [Left: Ye] [Right: s] Calf Left: Right: Point of Measurement: 38 cm From Medial Instep 37 cm 42 cm Ankle Left: Right: Point of Measurement: 15 cm From Medial Instep 27 cm 28 cm Knee To Floor Left: Right: From Medial Instep 48 cm 48 cm Vascular Assessment Pulses: Dorsalis Pedis Palpable: [Left:Yes] [Right:Yes] Electronic Signature(s) Signed: 01/23/2022 4:52:51 PM By: [name] Coria RN Entered By: [name] on 01/23/2022 08:20:02 Leverette, [name] Davis D. ([id number]) -------------------------------------------------------------------------------- Multi Wound Chart Details Patient Name: [name] Ewing D. Date of Service: 01/23/2022 8:00 AM Medical Record Number: [id number] Patient Account Number: [id number] Date of Birth/Sex: 1940/12/13 (81 y.o. M) Treating RN: [name] Primary Care Agapito Hanway:  [name] Other Clinician: Referring Jasamine Pottinger: [name] Treating Verdine Grenfell/Extender: [name] in Treatment: 1 Photos: Wound Location: Right, Lateral Lower Leg Right, Plantar Foot Left, Plantar Foot Wounding Event: Gradually Appeared Gradually Appeared Gradually Appeared Primary Etiology: Venous Leg Ulcer Neuropathic Ulcer-Non Diabetic Neuropathic Ulcer-Non Diabetic Comorbid History: Congestive Heart Failure, Congestive Heart Failure, Congestive Heart Failure, Hypertension Hypertension Hypertension Date Acquired: 11/28/2021 12/28/2020  12/28/2020 Weeks of Treatment: 1 1 1  Wound Status: Open Open Open Wound Recurrence: No No No Measurements L x W x D (cm) 5x2.5x0.1 0.4x0.1x0.3 0.2x0.1x0.2 Area (cm) : 9.817 0.031 0.016 Volume (cm) : 0.982 0.009 0.003 % Reduction in Area: 74.70% 60.80% 77.50% % Reduction in Volume: 74.70% 62.50% 78.60% Classification: Full Thickness Without Exposed Full Thickness Without Exposed Full Thickness Without Exposed Support Structures Support Structures Support Structures Exudate Amount: Medium Medium Medium Exudate Type: Serosanguineous Serosanguineous Serosanguineous Exudate Color: red, brown red, brown red, brown Granulation Amount: Large (67-100%) Medium (34-66%) None Present (0%) Granulation Quality: Red, Pink Red N/A Necrotic Amount: Small (1-33%) Medium (34-66%) Large (67-100%) Exposed Structures: Fat Layer (Subcutaneous Tissue): Fat Layer (Subcutaneous Tissue): Fascia: No Yes Yes Fat Layer (Subcutaneous Tissue): Fascia: No No Tendon: No Tendon: No Muscle: No Muscle: No Joint: No Joint: No Bone: No Bone: No Epithelialization: None None N/A Wound Number: 4 N/A N/A Photos: N/A N/A Wound Location: Left, Anterior Lower Leg N/A N/A Wounding Event: Gradually Appeared N/A N/A Primary Etiology: Venous Leg Ulcer N/A N/A Comorbid History: Congestive Heart Failure, N/A N/A Hypertension Date Acquired: 06/30/2021 N/A N/A Weeks of  Treatment: 1 N/A N/A Wound Status: Open N/A N/A Krolak, [name] ([id number]) Wound Recurrence: No N/A N/A Measurements L x W x D (cm) 0.5x0.2x0.1 N/A N/A Area (cm) : 0.079 N/A N/A Volume (cm) : 0.008 N/A N/A % Reduction in Area: 89.50% N/A N/A % Reduction in Volume: 89.30% N/A N/A Classification: Full Thickness Without Exposed N/A N/A Support Structures Exudate Amount: Medium N/A N/A Exudate Type: Serosanguineous N/A N/A Exudate Color: red, brown N/A N/A Granulation Amount: None Present (0%) N/A N/A Granulation Quality: N/A N/A N/A Necrotic Amount: Large (67-100%) N/A N/A Exposed Structures: Fascia: No N/A N/A Fat Layer (Subcutaneous Tissue): No Tendon: No Muscle: No Joint: No Bone: No Epithelialization: None N/A N/A Treatment Notes Electronic Signature(s) Signed: 01/23/2022 4:52:51 PM By: [name] Coria RN Entered By: [name] on 01/23/2022 08:20:23 Penagos, [name] ([id number]) -------------------------------------------------------------------------------- Multi-Disciplinary Care Plan Details Patient Name: [name] Ewing D. Date of Service: 01/23/2022 8:00 AM Medical Record Number: [id number] Patient Account Number: [id number] Date of Birth/Sex: [birth year] (81 y.o. M) Treating RN: [name] Primary Care Jyden Kromer: [name] Other Clinician: Referring Parlee Amescua: [name] Treating Drusilla Wampole/Extender: [name] in Treatment: 1 Active Inactive Wound/Skin Impairment Nursing Diagnoses: Knowledge deficit related to ulceration/compromised skin integrity Goals: Patient/caregiver will verbalize understanding of skin care regimen Date Initiated: 01/16/2022 Target Resolution Date: 02/16/2022 Goal Status: Active Ulcer/skin breakdown will have a volume reduction of 30% by week 4 Date Initiated: 01/16/2022 Target Resolution Date: 02/16/2022 Goal Status: Active Ulcer/skin breakdown will have a volume reduction of 50% by week 8 Date Initiated:  01/16/2022 Target Resolution Date: 03/19/2022 Goal Status: Active Ulcer/skin breakdown will have a volume reduction of 80% by week 12 Date Initiated: 01/16/2022 Target Resolution Date: 04/18/2022 Goal Status: Active Ulcer/skin breakdown will heal within 14 weeks Date Initiated: 01/16/2022 Target Resolution Date: 05/19/2022 Goal Status: Active Interventions: Assess patient/caregiver ability to obtain necessary supplies Assess patient/caregiver ability to perform ulcer/skin care regimen upon admission and as needed Assess ulceration(s) every visit Notes: Electronic Signature(s) Signed: 01/23/2022 4:52:51 PM By: [name] Coria RN Entered By: [name] on 01/23/2022 08:20:09 Laury, Tysin D. ([id number]) -------------------------------------------------------------------------------- Pain Assessment Details Patient Name: [name] Ewing D. Date of Service: 01/23/2022 8:00 AM Medical Record Number: [id number] Patient Account Number: [id number] Date of Birth/Sex: Sep 28, 1940 (81 y.o. M) Treating RN: [name] Primary Care Hollis Oh: [name] Other  Clinician: Referring Alizia Greif: [name] Treating [name] in Treatment: 1 Active Problems Location of Pain Severity and Description of Pain Patient Has Paino No Site Locations Pain Management and Medication Current Pain Management: Electronic Signature(s) Signed: 01/23/2022 4:52:51 PM By: [name] Coria RN Entered By: [name] on 01/23/2022 08:09:03 Kluge,  D. ([id number]) -------------------------------------------------------------------------------- Patient/Caregiver Education Details Patient Name: [name] Ewing D. Date of Service: 01/23/2022 8:00 AM Medical Record Number: [id number] Patient Account Number: [id number] Date of Birth/Gender: Mar 27, 1941 (81 y.o. M) Treating RN: [name] Primary Care Physician: [name] Other Clinician: Referring Physician: Tally [name] Treating  Physician/Extender: [name] in Treatment: 1 Education Assessment Education Provided To: Patient Education Topics Provided Wound/Skin Impairment: Methods: Explain/Verbal Responses: State content correctly Electronic Signature(s) Signed: 01/23/2022 4:52:51 PM By: [name] Coria RN Entered By: [name] on 01/23/2022 08:24:34 Forero, [name] ([id number]) -------------------------------------------------------------------------------- Wound Assessment Details Patient Name: [name] Ewing D. Date of Service: 01/23/2022 8:00 AM Medical Record Number: [id number] Patient Account Number: [id number] Date of Birth/Sex: 02/10/1941 (81 y.o. M) Treating RN: [name] Primary Care Denzel Etienne: [name] Other Clinician: Referring Janequa Kipnis: [name] Treating Happy Begeman/Extender: [name] in Treatment: 1 Wound Status Wound Number: 1 Primary Etiology: Venous Leg Ulcer Wound Location: Right, Lateral Lower Leg Wound Status: Open Wounding Event: Gradually Appeared Comorbid History: Congestive Heart Failure, Hypertension Date Acquired: 11/28/2021 Weeks Of Treatment: 1 Clustered Wound: No Photos Wound Measurements Length: (cm) 5 Width: (cm) 2.5 Depth: (cm) 0.1 Area: (cm) 9.817 Volume: (cm) 0.982 % Reduction in Area: 74.7% % Reduction in Volume: 74.7% Epithelialization: None Tunneling: No Undermining: No Wound Description Classification: Full Thickness Without Exposed Support Structures Exudate Amount: Medium Exudate Type: Serosanguineous Exudate Color: red, brown Foul Odor After Cleansing: No Slough/Fibrino Yes Wound Bed Granulation Amount: Large (67-100%) Exposed Structure Granulation Quality: Red, Pink Fascia Exposed: No Necrotic Amount: Small (1-33%) Fat Layer (Subcutaneous Tissue) Exposed: Yes Necrotic Quality: Adherent Slough Tendon Exposed: No Muscle Exposed: No Joint Exposed: No Bone Exposed: No Treatment Notes Wound #1 (Lower Leg)  Wound Laterality: Right, Lateral Cleanser Wound Cleanser Discharge Instruction: Wash your hands with soap and water. Remove old dressing, discard into plastic bag and place into trash. Cleanse the wound with Wound Cleanser prior to applying a clean dressing using gauze sponges, not tissues or cotton balls. Do not scrub or use excessive force. Pat dry using gauze sponges, not tissue or cotton balls. JEMAINE, PROKOP ([id number]) Peri-Wound Care Topical Primary Dressing Silvercel Small 2x2 (in/in) Discharge Instruction: Apply Silvercel Small 2x2 (in/in) as instructed Secondary Dressing ABD Pad 5x9 (in/in) Discharge Instruction: Cover with ABD pad Gauze Discharge Instruction: As directed: dry, moistened with saline or moistened with Dakins Solution Secured With Medipore Tape - 48M Medipore H Soft Cloth Surgical Tape, 2x2 (in/yd) Tubigrip Size E, 3.5x10 (in/yds) Discharge Instruction: Apply 3 Tubigrip E 3-finger-widths below knee to base of toes to secure dressing and/or for swelling. Compression Wrap Compression Stockings Add-Ons Electronic Signature(s) Signed: 01/23/2022 4:52:51 PM By: [name] Coria RN Entered By: [name] on 01/23/2022 08:17:41 Grimley, Karsin D. ([id number]) -------------------------------------------------------------------------------- Wound Assessment Details Patient Name: [name] Ewing D. Date of Service: 01/23/2022 8:00 AM Medical Record Number: [id number] Patient Account Number: [id number] Date of Birth/Sex: 1941-01-17 (81 y.o. M) Treating RN: [name] Primary Care Traycen Goyer: [name] Other Clinician: Referring Alfreddie Consalvo: [name] Treating Anni Hocevar/Extender: [name] in Treatment: 1 Wound Status Wound Number: 2 Primary Etiology: Neuropathic Ulcer-Non Diabetic Wound Location: Right, Plantar Foot Wound Status: Open  Wounding Event: Gradually Appeared Comorbid History: Congestive Heart Failure, Hypertension Date Acquired:  12/28/2020 Weeks Of Treatment: 1 Clustered Wound: No Photos Wound Measurements Length: (cm) 0.4 Width: (cm) 0.1 Depth: (cm) 0.3 Area: (cm) 0.031 Volume: (cm) 0.009 % Reduction in Area: 60.8% % Reduction in Volume: 62.5% Epithelialization: None Tunneling: No Undermining: No Wound Description Classification: Full Thickness Without Exposed Support Structures Exudate Amount: Medium Exudate Type: Serosanguineous Exudate Color: red, brown Foul Odor After Cleansing: No Slough/Fibrino Yes Wound Bed Granulation Amount: Medium (34-66%) Exposed Structure Granulation Quality: Red Fat Layer (Subcutaneous Tissue) Exposed: Yes Necrotic Amount: Medium (34-66%) Necrotic Quality: Adherent Slough Treatment Notes Wound #2 (Foot) Wound Laterality: Plantar, Right Cleanser Wound Cleanser Discharge Instruction: Wash your hands with soap and water. Remove old dressing, discard into plastic bag and place into trash. Cleanse the wound with Wound Cleanser prior to applying a clean dressing using gauze sponges, not tissues or cotton balls. Do not scrub or use excessive force. Pat dry using gauze sponges, not tissue or cotton balls. Peri-Wound Care Topical Shimko, [name] ([id number]) Primary Dressing Silvercel Small 2x2 (in/in) Discharge Instruction: Apply Silvercel Small 2x2 (in/in) as instructed Secondary Dressing Gauze Discharge Instruction: As directed: dry, moistened with saline or moistened with Dakins Solution Secured With Medipore Tape - 573M Medipore H Soft Cloth Surgical Tape, 2x2 (in/yd) Tubigrip Size E, 3.5x10 (in/yds) Discharge Instruction: Apply 3 Tubigrip E 3-finger-widths below knee to base of toes to secure dressing and/or for swelling. Compression Wrap Compression Stockings Add-Ons Electronic Signature(s) Signed: 01/23/2022 4:52:51 PM By: [name] Coria RN Entered By: [name] on 01/23/2022 08:18:00 Fujiwara, [name]  ([id number]) -------------------------------------------------------------------------------- Wound Assessment Details Patient Name: [name] Ewing D. Date of Service: 01/23/2022 8:00 AM Medical Record Number: [id number] Patient Account Number: [id number] Date of Birth/Sex: 05-27-1941 (81 y.o. M) Treating RN: [name] Primary Care Zymarion Favorite: [name] Other Clinician: Referring Kenyada Hy: [name] Treating Sarkis Rhines/Extender: [name] in Treatment: 1 Wound Status Wound Number: 3 Primary Etiology: Neuropathic Ulcer-Non Diabetic Wound Location: Left, Plantar Foot Wound Status: Open Wounding Event: Gradually Appeared Comorbid History: Congestive Heart Failure, Hypertension Date Acquired: 12/28/2020 Weeks Of Treatment: 1 Clustered Wound: No Photos Wound Measurements Length: (cm) 0.2 Width: (cm) 0.1 Depth: (cm) 0.2 Area: (cm) 0.016 Volume: (cm) 0.003 % Reduction in Area: 77.5% % Reduction in Volume: 78.6% Tunneling: No Undermining: No Wound Description Classification: Full Thickness Without Exposed Support Structures Exudate Amount: Medium Exudate Type: Serosanguineous Exudate Color: red, brown Foul Odor After Cleansing: No Slough/Fibrino Yes Wound Bed Granulation Amount: None Present (0%) Exposed Structure Necrotic Amount: Large (67-100%) Fascia Exposed: No Necrotic Quality: Adherent Slough Fat Layer (Subcutaneous Tissue) Exposed: No Tendon Exposed: No Muscle Exposed: No Joint Exposed: No Bone Exposed: No Treatment Notes Wound #3 (Foot) Wound Laterality: Plantar, Left Cleanser Wound Cleanser Discharge Instruction: Wash your hands with soap and water. Remove old dressing, discard into plastic bag and place into trash. Cleanse the wound with Wound Cleanser prior to applying a clean dressing using gauze sponges, not tissues or cotton balls. Do not scrub or use excessive force. Pat dry using gauze sponges, not tissue or cotton balls. [name], [name] ([id number]) Peri-Wound Care Topical Primary Dressing Silvercel Small 2x2 (in/in) Discharge Instruction: Apply Silvercel Small 2x2 (in/in) as instructed Secondary Dressing Gauze Discharge Instruction: As directed: dry, moistened with saline or moistened with Dakins Solution Secured With Medipore Tape - 573M Medipore H Soft Cloth Surgical Tape, 2x2 (in/yd) Tubigrip Size E, 3.5x10 (in/yds) Discharge Instruction: Apply 3 Tubigrip E 3-finger-widths  below knee to base of toes to secure dressing and/or for swelling. Compression Wrap Compression Stockings Add-Ons Electronic Signature(s) Signed: 01/23/2022 4:52:51 PM By: [name] Coria RN Entered By: [name] on 01/23/2022 08:18:18 Korte, [name] ([id number]) -------------------------------------------------------------------------------- Wound Assessment Details Patient Name: [name] Ewing D. Date of Service: 01/23/2022 8:00 AM Medical Record Number: [id number] Patient Account Number: [id number] Date of Birth/Sex: [birth year] (81 y.o. M) Treating RN: [name] Primary Care Aryiah Monterosso: [name] Other Clinician: Referring Halia Franey: [name] Treating Elinor Kleine/Extender: [name] in Treatment: 1 Wound Status Wound Number: 4 Primary Etiology: Venous Leg Ulcer Wound Location: Left, Anterior Lower Leg Wound Status: Open Wounding Event: Gradually Appeared Comorbid History: Congestive Heart Failure, Hypertension Date Acquired: 06/30/2021 Weeks Of Treatment: 1 Clustered Wound: No Photos Wound Measurements Length: (cm) 0.5 Width: (cm) 0.2 Depth: (cm) 0.1 Area: (cm) 0.079 Volume: (cm) 0.008 % Reduction in Area: 89.5% % Reduction in Volume: 89.3% Epithelialization: None Tunneling: No Undermining: No Wound Description Classification: Full Thickness Without Exposed Support Structures Exudate Amount: Medium Exudate Type: Serosanguineous Exudate Color: red, brown Foul Odor After Cleansing:  No Slough/Fibrino Yes Wound Bed Granulation Amount: None Present (0%) Exposed Structure Necrotic Amount: Large (67-100%) Fascia Exposed: No Necrotic Quality: Adherent Slough Fat Layer (Subcutaneous Tissue) Exposed: No Tendon Exposed: No Muscle Exposed: No Joint Exposed: No Bone Exposed: No Treatment Notes Wound #4 (Lower Leg) Wound Laterality: Left, Anterior Cleanser Wound Cleanser Discharge Instruction: Wash your hands with soap and water. Remove old dressing, discard into plastic bag and place into trash. Cleanse the wound with Wound Cleanser prior to applying a clean dressing using gauze sponges, not tissues or cotton balls. Do not scrub or use excessive force. Pat dry using gauze sponges, not tissue or cotton balls. [name], [name] ([id number]) Peri-Wound Care Topical Primary Dressing Silvercel Small 2x2 (in/in) Discharge Instruction: Apply Silvercel Small 2x2 (in/in) as instructed Secondary Dressing ABD Pad 5x9 (in/in) Discharge Instruction: Cover with ABD pad Gauze Discharge Instruction: As directed: dry, moistened with saline or moistened with Dakins Solution Secured With Medipore Tape - 14M Medipore H Soft Cloth Surgical Tape, 2x2 (in/yd) Tubigrip Size E, 3.5x10 (in/yds) Discharge Instruction: Apply 3 Tubigrip E 3-finger-widths below knee to base of toes to secure dressing and/or for swelling. Compression Wrap Compression Stockings Add-Ons Electronic Signature(s) Signed: 01/23/2022 4:52:51 PM By: [name] Coria RN Entered By: [name] on 01/23/2022 08:18:32 Dickerman, Adit D. ([id number]) -------------------------------------------------------------------------------- Vitals Details Patient Name: [name] Ewing D. Date of Service: 01/23/2022 8:00 AM Medical Record Number: [id number] Patient Account Number: [id number] Date of Birth/Sex: 1940/08/21 (81 y.o. M) Treating RN: [name] Primary Care Brendaliz Kuk: [name] Other Clinician: Referring Concepcion Kirkpatrick:  [name] Treating Arsema Tusing/Extender: [name] in Treatment: 1 Vital Signs Time Taken: 08:07 Temperature (F): 97.9 Height (in): 74 Pulse (bpm): 59 Weight (lbs): 244 Respiratory Rate (breaths/min): 18 Body Mass Index (BMI): 31.3 Blood Pressure (mmHg): 135/66 Reference Range: 80 - 120 mg / dl Electronic Signature(s) Signed: 01/23/2022 4:52:51 PM By: [name] Coria RN Entered By: [name] on 01/23/2022 08:38:08

## 2022-01-24 ENCOUNTER — Encounter (INDEPENDENT_AMBULATORY_CARE_PROVIDER_SITE_OTHER): Payer: PPO

## 2022-01-24 ENCOUNTER — Ambulatory Visit (INDEPENDENT_AMBULATORY_CARE_PROVIDER_SITE_OTHER): Payer: PPO

## 2022-01-24 DIAGNOSIS — S91309S Unspecified open wound, unspecified foot, sequela: Secondary | ICD-10-CM | POA: Diagnosis not present

## 2022-01-30 ENCOUNTER — Encounter: Payer: PPO | Attending: Physician Assistant

## 2022-01-30 DIAGNOSIS — I480 Paroxysmal atrial fibrillation: Secondary | ICD-10-CM | POA: Diagnosis not present

## 2022-01-30 DIAGNOSIS — R7303 Prediabetes: Secondary | ICD-10-CM | POA: Diagnosis not present

## 2022-01-30 DIAGNOSIS — I5042 Chronic combined systolic (congestive) and diastolic (congestive) heart failure: Secondary | ICD-10-CM | POA: Diagnosis not present

## 2022-01-30 DIAGNOSIS — Z79899 Other long term (current) drug therapy: Secondary | ICD-10-CM | POA: Insufficient documentation

## 2022-01-30 DIAGNOSIS — I130 Hypertensive heart and chronic kidney disease with heart failure and stage 1 through stage 4 chronic kidney disease, or unspecified chronic kidney disease: Secondary | ICD-10-CM | POA: Insufficient documentation

## 2022-01-30 DIAGNOSIS — L97522 Non-pressure chronic ulcer of other part of left foot with fat layer exposed: Secondary | ICD-10-CM | POA: Insufficient documentation

## 2022-01-30 DIAGNOSIS — I420 Dilated cardiomyopathy: Secondary | ICD-10-CM | POA: Diagnosis not present

## 2022-01-30 DIAGNOSIS — I2510 Atherosclerotic heart disease of native coronary artery without angina pectoris: Secondary | ICD-10-CM | POA: Diagnosis not present

## 2022-01-30 DIAGNOSIS — I87333 Chronic venous hypertension (idiopathic) with ulcer and inflammation of bilateral lower extremity: Secondary | ICD-10-CM | POA: Diagnosis not present

## 2022-01-30 DIAGNOSIS — G9009 Other idiopathic peripheral autonomic neuropathy: Secondary | ICD-10-CM | POA: Insufficient documentation

## 2022-01-30 DIAGNOSIS — L97812 Non-pressure chronic ulcer of other part of right lower leg with fat layer exposed: Secondary | ICD-10-CM | POA: Insufficient documentation

## 2022-01-30 DIAGNOSIS — L97822 Non-pressure chronic ulcer of other part of left lower leg with fat layer exposed: Secondary | ICD-10-CM | POA: Diagnosis not present

## 2022-01-30 DIAGNOSIS — L97512 Non-pressure chronic ulcer of other part of right foot with fat layer exposed: Secondary | ICD-10-CM | POA: Insufficient documentation

## 2022-01-30 DIAGNOSIS — G629 Polyneuropathy, unspecified: Secondary | ICD-10-CM | POA: Insufficient documentation

## 2022-01-30 DIAGNOSIS — Z7901 Long term (current) use of anticoagulants: Secondary | ICD-10-CM | POA: Diagnosis not present

## 2022-01-30 DIAGNOSIS — N1832 Chronic kidney disease, stage 3b: Secondary | ICD-10-CM | POA: Diagnosis not present

## 2022-01-30 NOTE — Progress Notes (Signed)
NAS, WAFER ([id number]) Visit Report for 01/30/2022 Arrival Information Details Patient Name: [name], [name]. Date of Service: 01/30/2022 8:00 AM Medical Record Number: [id number] Patient Account Number: [id number] Date of Birth/Sex: August 13, 1940 (81 y.o. M) Treating RN: Levora Dredge Primary Care [name] Haese: [name] Other Clinician: Referring [name]: [name] Treating Makylee Sanborn/Extender: Skipper Cliche in Treatment: 2 Visit Information History Since Last Visit Added or deleted any medications: No Patient Arrived: [name] Any new allergies or adverse reactions: No Arrival Time: 08:24 Had a fall or experienced change in No Accompanied By: self activities of daily living that may affect Transfer Assistance: None risk of falls: Patient Identification Verified: Yes Hospitalized since last visit: No Secondary Verification Process Completed: Yes Has Dressing in Place as Prescribed: Yes Patient Requires Transmission-Based Precautions: No Pain Present Now: No Patient Has Alerts: No Electronic Signature(s) Signed: 01/30/2022 4:10:56 PM By: Levora Dredge Entered By: Levora Dredge on 01/30/2022 08:24:31 [name], [name] ([id number]) -------------------------------------------------------------------------------- Clinic Level of Care Assessment Details Patient Name: [name] Ewing D. Date of Service: 01/30/2022 8:00 AM Medical Record Number: [id number] Patient Account Number: [id number] Date of Birth/Sex: 09-19-1940 (81 y.o. M) Treating RN: Levora Dredge Primary Care Gwenith Tschida: [name] Other Clinician: Referring [name]: [name] Treating [name]/Extender: Skipper Cliche in Treatment: 2 Clinic Level of Care Assessment Items TOOL 4 Quantity Score []  - Use when only an EandM is performed on FOLLOW-UP visit 0 ASSESSMENTS - Nursing Assessment / Reassessment X - Reassessment of Co-morbidities (includes updates in patient status) 1 10 X- 1 5 Reassessment  of Adherence to Treatment Plan ASSESSMENTS - Wound and Skin Assessment / Reassessment []  - Simple Wound Assessment / Reassessment - one wound 0 X- 4 5 Complex Wound Assessment / Reassessment - multiple wounds []  - 0 Dermatologic / Skin Assessment (not related to wound area) ASSESSMENTS - Focused Assessment []  - Circumferential Edema Measurements - multi extremities 0 []  - 0 Nutritional Assessment / Counseling / Intervention []  - 0 Lower Extremity Assessment (monofilament, tuning fork, pulses) []  - 0 Peripheral Arterial Disease Assessment (using hand held doppler) ASSESSMENTS - Ostomy and/or Continence Assessment and Care []  - Incontinence Assessment and Management 0 []  - 0 Ostomy Care Assessment and Management (repouching, etc.) PROCESS - Coordination of Care X - Simple Patient / Family Education for ongoing care 1 15 []  - 0 Complex (extensive) Patient / Family Education for ongoing care []  - 0 Staff obtains Programmer, systems, Records, Test Results / Process Orders []  - 0 Staff telephones HHA, Nursing Homes / Clarify orders / etc []  - 0 Routine Transfer to another Facility (non-emergent condition) []  - 0 Routine Hospital Admission (non-emergent condition) []  - 0 New Admissions / Biomedical engineer / Ordering NPWT, Apligraf, etc. []  - 0 Emergency Hospital Admission (emergent condition) X- 1 10 Simple Discharge Coordination []  - 0 Complex (extensive) Discharge Coordination PROCESS - Special Needs []  - Pediatric / Minor Patient Management 0 []  - 0 Isolation Patient Management []  - 0 Hearing / Language / Visual special needs []  - 0 Assessment of Community assistance (transportation, D/C planning, etc.) []  - 0 Additional assistance / Altered mentation []  - 0 Support Surface(s) Assessment (bed, cushion, seat, etc.) INTERVENTIONS - Wound Cleansing / Measurement [name], [name] ([id number]) []  - 0 Simple Wound Cleansing - one wound X- 4 5 Complex Wound Cleansing -  multiple wounds []  - 0 Wound Imaging (photographs - any number of wounds) []  - 0 Wound Tracing (instead of photographs) []  - 0 Simple Wound Measurement - one wound []  -  0 Complex Wound Measurement - multiple wounds INTERVENTIONS - Wound Dressings X - Small Wound Dressing one or multiple wounds 4 10 []  - 0 Medium Wound Dressing one or multiple wounds []  - 0 Large Wound Dressing one or multiple wounds []  - 0 Application of Medications - topical []  - 0 Application of Medications - injection INTERVENTIONS - Miscellaneous []  - External ear exam 0 []  - 0 Specimen Collection (cultures, biopsies, blood, body fluids, etc.) []  - 0 Specimen(s) / Culture(s) sent or taken to Lab for analysis []  - 0 Patient Transfer (multiple staff / Harrel Lemon Lift / Similar devices) []  - 0 Simple Staple / Suture removal (25 or less) []  - 0 Complex Staple / Suture removal (26 or more) []  - 0 Hypo / Hyperglycemic Management (close monitor of Blood Glucose) []  - 0 Ankle / Brachial Index (ABI) - do not check if billed separately []  - 0 Vital Signs Has the patient been seen at the hospital within the last three years: Yes Total Score: 120 Level Of Care: New/Established - Level 4 Electronic Signature(s) Signed: 01/30/2022 4:10:56 PM By: Levora Dredge Entered By: Levora Dredge on 01/30/2022 08:27:53 [name], [name] ([id number]) -------------------------------------------------------------------------------- Encounter Discharge Information Details Patient Name: [name] Ewing D. Date of Service: 01/30/2022 8:00 AM Medical Record Number: [id number] Patient Account Number: [id number] Date of Birth/Sex: 1940-11-07 (81 y.o. M) Treating RN: Levora Dredge Primary Care Lilac Hoff: [name] Other Clinician: Referring [name]: [name] Treating Masiah Lewing/Extender: Skipper Cliche in Treatment: 2 Encounter Discharge Information Items Discharge Condition: Stable Ambulatory Status: Walker Discharge  Destination: Home Transportation: Private Auto Accompanied By: self Schedule Follow-up Appointment: Yes Clinical Summary of Care: Electronic Signature(s) Signed: 01/30/2022 4:10:56 PM By: Levora Dredge Entered By: Levora Dredge on 01/30/2022 08:27:19 [name], [name] ([id number]) -------------------------------------------------------------------------------- Wound Assessment Details Patient Name: [name] Ewing D. Date of Service: 01/30/2022 8:00 AM Medical Record Number: [id number] Patient Account Number: [id number] Date of Birth/Sex: [birth year] (81 y.o. M) Treating RN: Levora Dredge Primary Care Lamaria Hildebrandt: [name] Other Clinician: Referring Shandria Clinch: [name] Treating Artelia Game/Extender: [name] Weeks in Treatment: 2 Wound Status Wound Number: 1 Primary Etiology: Venous Leg Ulcer Wound Location: Right, Lateral Lower Leg Wound Status: Open Wounding Event: Gradually Appeared Comorbid History: Congestive Heart Failure, Hypertension Date Acquired: 11/28/2021 Weeks Of Treatment: 2 Clustered Wound: No Wound Measurements Length: (cm) 5 Width: (cm) 2.5 Depth: (cm) 0.1 Area: (cm) 9.817 Volume: (cm) 0.982 % Reduction in Area: 74.7% % Reduction in Volume: 74.7% Epithelialization: Medium (34-66%) Tunneling: No Undermining: No Wound Description Classification: Full Thickness Without Exposed Support Structures Exudate Amount: Medium Exudate Type: Serosanguineous Exudate Color: red, brown Foul Odor After Cleansing: No Slough/Fibrino Yes Wound Bed Granulation Amount: Large (67-100%) Exposed Structure Granulation Quality: Red, Pink Fascia Exposed: No Necrotic Amount: Small (1-33%) Fat Layer (Subcutaneous Tissue) Exposed: Yes Necrotic Quality: Adherent Slough Tendon Exposed: No Muscle Exposed: No Joint Exposed: No Bone Exposed: No Assessment Notes much improved at nurse visit on 01/30/22 Treatment Notes Wound #1 (Lower Leg) Wound Laterality: Right,  Lateral Cleanser Wound Cleanser Discharge Instruction: Wash your hands with soap and water. Remove old dressing, discard into plastic bag and place into trash. Cleanse the wound with Wound Cleanser prior to applying a clean dressing using gauze sponges, not tissues or cotton balls. Do not scrub or use excessive force. Pat dry using gauze sponges, not tissue or cotton balls. Peri-Wound Care Topical Primary Dressing Silvercel Small 2x2 (in/in) Discharge Instruction: Apply Silvercel Small 2x2 (in/in) as instructed Secondary Dressing ABD Pad 5x9 (  in/in) Discharge Instruction: Cover with ABD pad Gauze [name], [name] ([id number]) Discharge Instruction: As directed: dry, moistened with saline or moistened with Dakins Solution Secured With Medipore Tape - 82M Medipore H Soft Cloth Surgical Tape, 2x2 (in/yd) Tubigrip Size E, 3.5x10 (in/yds) Discharge Instruction: Apply 3 Tubigrip E 3-finger-widths below knee to base of toes to secure dressing and/or for swelling. Compression Wrap Compression Stockings Add-Ons Electronic Signature(s) Signed: 01/30/2022 4:10:56 PM By: Levora Dredge Entered By: Levora Dredge on 01/30/2022 08:25:13 Ebbert, Termaine D. ([id number]) -------------------------------------------------------------------------------- Wound Assessment Details Patient Name: [name] Ewing D. Date of Service: 01/30/2022 8:00 AM Medical Record Number: [id number] Patient Account Number: [id number] Date of Birth/Sex: 1941/04/21 (81 y.o. M) Treating RN: Levora Dredge Primary Care Stefani Baik: [name] Other Clinician: Referring Chrishawn Kring: [name] Treating Jakota Manthei/Extender: [name] Weeks in Treatment: 2 Wound Status Wound Number: 2 Primary Etiology: Neuropathic Ulcer-Non Diabetic Wound Location: Right, Plantar Foot Wound Status: Open Wounding Event: Gradually Appeared Comorbid History: Congestive Heart Failure, Hypertension Date Acquired: 12/28/2020 Weeks Of Treatment:  2 Clustered Wound: No Wound Measurements Length: (cm) 0.4 Width: (cm) 0.1 Depth: (cm) 0.3 Area: (cm) 0.031 Volume: (cm) 0.009 % Reduction in Area: 60.8% % Reduction in Volume: 62.5% Epithelialization: None Tunneling: No Undermining: No Wound Description Classification: Full Thickness Without Exposed Support Structures Exudate Amount: Medium Exudate Type: Serosanguineous Exudate Color: red, brown Foul Odor After Cleansing: No Slough/Fibrino Yes Wound Bed Granulation Amount: Medium (34-66%) Exposed Structure Granulation Quality: Red Fat Layer (Subcutaneous Tissue) Exposed: Yes Necrotic Amount: Medium (34-66%) Necrotic Quality: Adherent Slough Treatment Notes Wound #2 (Foot) Wound Laterality: Plantar, Right Cleanser Wound Cleanser Discharge Instruction: Wash your hands with soap and water. Remove old dressing, discard into plastic bag and place into trash. Cleanse the wound with Wound Cleanser prior to applying a clean dressing using gauze sponges, not tissues or cotton balls. Do not scrub or use excessive force. Pat dry using gauze sponges, not tissue or cotton balls. Peri-Wound Care Topical Primary Dressing Silvercel Small 2x2 (in/in) Discharge Instruction: Apply Silvercel Small 2x2 (in/in) as instructed Secondary Dressing Gauze Discharge Instruction: As directed: dry, moistened with saline or moistened with Dakins Solution Secured With Medipore Tape - 82M Medipore H Soft Cloth Surgical Tape, 2x2 (in/yd) Tubigrip Size E, 3.5x10 (in/yds) Discharge Instruction: Apply 3 Tubigrip E 3-finger-widths below knee to base of toes to secure dressing and/or for swelling. Compression Wrap [name], [name] ([id number]) Compression Stockings Add-Ons Electronic Signature(s) Signed: 01/30/2022 4:10:56 PM By: Levora Dredge Entered By: Levora Dredge on 01/30/2022 08:25:29 [name], [name]  ([id number]) -------------------------------------------------------------------------------- Wound Assessment Details Patient Name: [name] Ewing D. Date of Service: 01/30/2022 8:00 AM Medical Record Number: [id number] Patient Account Number: [id number] Date of Birth/Sex: 1941/01/21 (81 y.o. M) Treating RN: Levora Dredge Primary Care Lodema Parma: [name] Other Clinician: Referring [name]: [name] Treating Joyceann Kruser/Extender: [name] Weeks in Treatment: 2 Wound Status Wound Number: 3 Primary Etiology: Neuropathic Ulcer-Non Diabetic Wound Location: Left, Plantar Foot Wound Status: Open Wounding Event: Gradually Appeared Comorbid History: Congestive Heart Failure, Hypertension Date Acquired: 12/28/2020 Weeks Of Treatment: 2 Clustered Wound: No Wound Measurements Length: (cm) 0.2 Width: (cm) 0.1 Depth: (cm) 0.2 Area: (cm) 0.016 Volume: (cm) 0.003 % Reduction in Area: 77.5% % Reduction in Volume: 78.6% Tunneling: No Undermining: No Wound Description Classification: Full Thickness Without Exposed Support Structures Exudate Amount: Medium Exudate Type: Serosanguineous Exudate Color: red, brown Foul Odor After Cleansing: No Slough/Fibrino Yes Wound Bed Granulation Amount: None Present (0%) Exposed Structure Necrotic Amount: Large (67-100%) Fascia Exposed: No Necrotic  Quality: Adherent Slough Fat Layer (Subcutaneous Tissue) Exposed: No Tendon Exposed: No Muscle Exposed: No Joint Exposed: No Bone Exposed: No Assessment Notes much improvement noted on nurse visit 01/30/22 Treatment Notes Wound #3 (Foot) Wound Laterality: Plantar, Left Cleanser Wound Cleanser Discharge Instruction: Wash your hands with soap and water. Remove old dressing, discard into plastic bag and place into trash. Cleanse the wound with Wound Cleanser prior to applying a clean dressing using gauze sponges, not tissues or cotton balls. Do not scrub or use excessive force. Pat dry using gauze  sponges, not tissue or cotton balls. Peri-Wound Care Topical Primary Dressing Silvercel Small 2x2 (in/in) Discharge Instruction: Apply Silvercel Small 2x2 (in/in) as instructed Secondary Dressing Gauze Discharge Instruction: As directed: dry, moistened with saline or moistened with Dakins Solution [name], [name] ([id number]) Secured With Medipore Tape - 72M Medipore H Soft Cloth Surgical Tape, 2x2 (in/yd) Tubigrip Size E, 3.5x10 (in/yds) Discharge Instruction: Apply 3 Tubigrip E 3-finger-widths below knee to base of toes to secure dressing and/or for swelling. Compression Wrap Compression Stockings Add-Ons Electronic Signature(s) Signed: 01/30/2022 4:10:56 PM By: Levora Dredge Entered By: Levora Dredge on 01/30/2022 08:26:03 [name], [name] ([id number]) -------------------------------------------------------------------------------- Wound Assessment Details Patient Name: [name] Ewing D. Date of Service: 01/30/2022 8:00 AM Medical Record Number: [id number] Patient Account Number: [id number] Date of Birth/Sex: 1940/08/21 (81 y.o. M) Treating RN: Levora Dredge Primary Care Suleima Ohlendorf: [name] Other Clinician: Referring [name]: [name] Treating Jesusmanuel Erbes/Extender: [name] Weeks in Treatment: 2 Wound Status Wound Number: 4 Primary Etiology: Venous Leg Ulcer Wound Location: Left, Anterior Lower Leg Wound Status: Open Wounding Event: Gradually Appeared Comorbid History: Congestive Heart Failure, Hypertension Date Acquired: 06/30/2021 Weeks Of Treatment: 2 Clustered Wound: No Wound Measurements Length: (cm) 0.5 Width: (cm) 0.2 Depth: (cm) 0.1 Area: (cm) 0.079 Volume: (cm) 0.008 % Reduction in Area: 89.5% % Reduction in Volume: 89.3% Epithelialization: None Tunneling: No Undermining: No Wound Description Classification: Full Thickness Without Exposed Support Structures Exudate Amount: Medium Exudate Type: Serosanguineous Exudate Color: red,  brown Foul Odor After Cleansing: No Slough/Fibrino Yes Wound Bed Granulation Amount: None Present (0%) Exposed Structure Necrotic Amount: Large (67-100%) Fascia Exposed: No Necrotic Quality: Adherent Slough Fat Layer (Subcutaneous Tissue) Exposed: No Tendon Exposed: No Muscle Exposed: No Joint Exposed: No Bone Exposed: No Assessment Notes much improvement noted on nurse visit 01/30/22 Treatment Notes Wound #4 (Lower Leg) Wound Laterality: Left, Anterior Cleanser Wound Cleanser Discharge Instruction: Wash your hands with soap and water. Remove old dressing, discard into plastic bag and place into trash. Cleanse the wound with Wound Cleanser prior to applying a clean dressing using gauze sponges, not tissues or cotton balls. Do not scrub or use excessive force. Pat dry using gauze sponges, not tissue or cotton balls. Peri-Wound Care Topical Primary Dressing Silvercel Small 2x2 (in/in) Discharge Instruction: Apply Silvercel Small 2x2 (in/in) as instructed Secondary Dressing ABD Pad 5x9 (in/in) Discharge Instruction: Cover with ABD pad Gauze Sawdey, Viet D. ([id number]) Discharge Instruction: As directed: dry, moistened with saline or moistened with Dakins Solution Secured With Medipore Tape - 72M Medipore H Soft Cloth Surgical Tape, 2x2 (in/yd) Tubigrip Size E, 3.5x10 (in/yds) Discharge Instruction: Apply 3 Tubigrip E 3-finger-widths below knee to base of toes to secure dressing and/or for swelling. Compression Wrap Compression Stockings Add-Ons Electronic Signature(s) Signed: 01/30/2022 4:10:56 PM By: Levora Dredge Entered By: Levora Dredge on 01/30/2022 08:26:18

## 2022-01-31 NOTE — Progress Notes (Signed)
DEV, DHONDT ([id number]) Visit Report for 01/30/2022 Physician Orders Details Patient Name: [name], [name]. Date of Service: 01/30/2022 8:00 AM Medical Record Number: [id number] Patient Account Number: [id number] Date of Birth/Sex: 08/04/1940 (81 y.o. M) Treating RN: Levora Dredge Primary Care Provider: [name] Other Clinician: Referring Provider: [name] Treating Provider/Extender: Skipper Cliche in Treatment: 2 Verbal / Phone Orders: No Diagnosis Coding Follow-up Appointments o Return Appointment in 1 week. o Nurse Visit as needed Bathing/ Shower/ Hygiene o May shower with wound dressing protected with water repellent cover or cast protector. Edema Control - Lymphedema / Segmental Compressive Device / Other o Elevate, Exercise Daily and Avoid Standing for Long Periods of Time. o Elevate legs to the level of the heart and pump ankles as often as possible o Elevate leg(s) parallel to the floor when sitting. Wound Treatment Wound #1 - Lower Leg Wound Laterality: Right, Lateral Cleanser: Wound Cleanser 1 x Per Week/30 Days Discharge Instructions: Wash your hands with soap and water. Remove old dressing, discard into plastic bag and place into trash. Cleanse the wound with Wound Cleanser prior to applying a clean dressing using gauze sponges, not tissues or cotton balls. Do not scrub or use excessive force. Pat dry using gauze sponges, not tissue or cotton balls. Primary Dressing: Silvercel Small 2x2 (in/in) 1 x Per Week/30 Days Discharge Instructions: Apply Silvercel Small 2x2 (in/in) as instructed Secondary Dressing: ABD Pad 5x9 (in/in) 1 x Per Week/30 Days Discharge Instructions: Cover with ABD pad Secondary Dressing: Gauze 1 x Per Week/30 Days Discharge Instructions: As directed: dry, moistened with saline or moistened with Dakins Solution Secured With: Medipore Tape - 72M Medipore H Soft Cloth Surgical Tape, 2x2 (in/yd) 1 x Per Week/30 Days Secured  With: Tubigrip Size E, 3.5x10 (in/yds) 1 x Per Week/30 Days Discharge Instructions: Apply 3 Tubigrip E 3-finger-widths below knee to base of toes to secure dressing and/or for swelling. Wound #2 - Foot Wound Laterality: Plantar, Right Cleanser: Wound Cleanser 1 x Per Week/30 Days Discharge Instructions: Wash your hands with soap and water. Remove old dressing, discard into plastic bag and place into trash. Cleanse the wound with Wound Cleanser prior to applying a clean dressing using gauze sponges, not tissues or cotton balls. Do not scrub or use excessive force. Pat dry using gauze sponges, not tissue or cotton balls. Primary Dressing: Silvercel Small 2x2 (in/in) 1 x Per Week/30 Days Discharge Instructions: Apply Silvercel Small 2x2 (in/in) as instructed Secondary Dressing: Gauze 1 x Per Week/30 Days Discharge Instructions: As directed: dry, moistened with saline or moistened with Dakins Solution Secured With: Medipore Tape - 72M Medipore H Soft Cloth Surgical Tape, 2x2 (in/yd) 1 x Per Week/30 Days Secured With: Tubigrip Size E, 3.5x10 (in/yds) 1 x Per Week/30 Days Discharge Instructions: Apply 3 Tubigrip E 3-finger-widths below knee to base of toes to secure dressing and/or for swelling. Wound #3 - Foot Wound Laterality: Plantar, Left [name], [name] ([id number]) Cleanser: Wound Cleanser 1 x Per Week/30 Days Discharge Instructions: Wash your hands with soap and water. Remove old dressing, discard into plastic bag and place into trash. Cleanse the wound with Wound Cleanser prior to applying a clean dressing using gauze sponges, not tissues or cotton balls. Do not scrub or use excessive force. Pat dry using gauze sponges, not tissue or cotton balls. Primary Dressing: Silvercel Small 2x2 (in/in) 1 x Per Week/30 Days Discharge Instructions: Apply Silvercel Small 2x2 (in/in) as instructed Secondary Dressing: Gauze 1 x Per Week/30 Days Discharge Instructions:  As directed: dry, moistened with  saline or moistened with Dakins Solution Secured With: Medipore Tape - 22M Medipore H Soft Cloth Surgical Tape, 2x2 (in/yd) 1 x Per Week/30 Days Secured With: Tubigrip Size E, 3.5x10 (in/yds) 1 x Per Week/30 Days Discharge Instructions: Apply 3 Tubigrip E 3-finger-widths below knee to base of toes to secure dressing and/or for swelling. Wound #4 - Lower Leg Wound Laterality: Left, Anterior Cleanser: Wound Cleanser 1 x Per Week/30 Days Discharge Instructions: Wash your hands with soap and water. Remove old dressing, discard into plastic bag and place into trash. Cleanse the wound with Wound Cleanser prior to applying a clean dressing using gauze sponges, not tissues or cotton balls. Do not scrub or use excessive force. Pat dry using gauze sponges, not tissue or cotton balls. Primary Dressing: Silvercel Small 2x2 (in/in) 1 x Per Week/30 Days Discharge Instructions: Apply Silvercel Small 2x2 (in/in) as instructed Secondary Dressing: ABD Pad 5x9 (in/in) 1 x Per Week/30 Days Discharge Instructions: Cover with ABD pad Secondary Dressing: Gauze 1 x Per Week/30 Days Discharge Instructions: As directed: dry, moistened with saline or moistened with Dakins Solution Secured With: Medipore Tape - 22M Medipore H Soft Cloth Surgical Tape, 2x2 (in/yd) 1 x Per Week/30 Days Secured With: Tubigrip Size E, 3.5x10 (in/yds) 1 x Per Week/30 Days Discharge Instructions: Apply 3 Tubigrip E 3-finger-widths below knee to base of toes to secure dressing and/or for swelling. Electronic Signature(s) Signed: 01/30/2022 4:10:56 PM By: Levora Dredge Signed: 01/30/2022 6:41:29 PM By: Worthy Keeler PA-C Entered By: Levora Dredge on 01/30/2022 08:31:54 Noy, Corrigan D. ([id number]) -------------------------------------------------------------------------------- SuperBill Details Patient Name: [name] Ewing D. Date of Service: 01/30/2022 Medical Record Number: [id number] Patient Account Number: [id number] Date of Birth/Sex:  [birth year] (81 y.o. M) Treating RN: Levora Dredge Primary Care Provider: [name] Other Clinician: Referring Provider: [name] Treating Provider/Extender: Skipper Cliche in Treatment: 2 Diagnosis Coding ICD-10 Codes Code Description [phone] Chronic venous hypertension (idiopathic) with ulcer and inflammation of bilateral lower extremity L97.812 Non-pressure chronic ulcer of other part of right lower leg with fat layer exposed L97.822 Non-pressure chronic ulcer of other part of left lower leg with fat layer exposed G90.09 Other idiopathic peripheral autonomic neuropathy L97.522 Non-pressure chronic ulcer of other part of left foot with fat layer exposed L97.512 Non-pressure chronic ulcer of other part of right foot with fat layer exposed I10 Essential (primary) hypertension I50.42 Chronic combined systolic (congestive) and diastolic (congestive) heart failure I48.0 Paroxysmal atrial fibrillation I42.0 Dilated cardiomyopathy Z79.01 Long term (current) use of anticoagulants I25.10 Atherosclerotic heart disease of native coronary artery without angina pectoris N18.32 Chronic kidney disease, stage 3b Facility Procedures CPT4 Code: [id number] Description: 99214 - WOUND CARE VISIT-LEV 4 EST PT Modifier: Quantity: 1 Electronic Signature(s) Signed: 01/30/2022 4:10:56 PM By: Levora Dredge Signed: 01/30/2022 6:41:29 PM By: Worthy Keeler PA-C Entered By: Levora Dredge on 01/30/2022 08:27:58

## 2022-02-06 ENCOUNTER — Encounter: Payer: PPO | Admitting: Physician Assistant

## 2022-02-06 DIAGNOSIS — L97512 Non-pressure chronic ulcer of other part of right foot with fat layer exposed: Secondary | ICD-10-CM | POA: Diagnosis not present

## 2022-02-06 DIAGNOSIS — I87333 Chronic venous hypertension (idiopathic) with ulcer and inflammation of bilateral lower extremity: Secondary | ICD-10-CM | POA: Diagnosis not present

## 2022-02-06 DIAGNOSIS — G6289 Other specified polyneuropathies: Secondary | ICD-10-CM | POA: Diagnosis not present

## 2022-02-06 NOTE — Progress Notes (Addendum)
TIRTH, COTHRON ([id number]) Visit Report for 02/06/2022 Chief Complaint Document Details Patient Name: [name]. Date of Service: 02/06/2022 8:15 AM Medical Record Number: [id number] Patient Account Number: [id number] Date of Birth/Sex: 09-19-1940 (81 y.o. M) Treating RN: Cornell Barman Primary Care Provider: [name] Other Clinician: Massie Kluver Referring Provider: [name] Treating Provider/Extender: Skipper Cliche in Treatment: 3 Information Obtained from: Patient Chief Complaint Multiple LE Ulcers Electronic Signature(s) Signed: 02/06/2022 8:19:27 AM By: Worthy Keeler PA-C Entered By: Worthy Keeler on 02/06/2022 08:19:27 [name], [name]. ([id number]) -------------------------------------------------------------------------------- Debridement Details Patient Name: [name]. Date of Service: 02/06/2022 8:15 AM Medical Record Number: [id number] Patient Account Number: [id number] Date of Birth/Sex: Apr 20, 1941 (81 y.o. M) Treating RN: Cornell Barman Primary Care Provider: [name] Other Clinician: Massie Kluver Referring Provider: [name] Treating Provider/Extender: Skipper Cliche in Treatment: 3 Debridement Performed for Wound #2 Right,Plantar Foot Assessment: Performed By: Physician Tommie Sams., PA-C Debridement Type: Debridement Level of Consciousness (Pre- Awake and Alert procedure): Pre-procedure Verification/Time Out Yes - 08:57 Taken: Start Time: 08:57 Total Area Debrided (L x W): 1 (cm) x 1 (cm) = 1 (cm) Tissue and other material Viable, Non-Viable, Callus, Slough, Subcutaneous, Slough debrided: Level: Skin/Subcutaneous Tissue Debridement Description: Excisional Instrument: Curette Bleeding: Minimum Hemostasis Achieved: Pressure Response to Treatment: Procedure was tolerated well Level of Consciousness (Post- Awake and Alert procedure): Post Debridement Measurements of Total Wound Length: (cm) 0.5 Width: (cm)  0.3 Depth: (cm) 0.5 Volume: (cm) 0.059 Character of Wound/Ulcer Post Debridement: Stable Post Procedure Diagnosis Same as Pre-procedure Electronic Signature(s) Signed: 02/06/2022 9:36:17 AM By: Massie Kluver Signed: 02/06/2022 11:26:52 AM By: Gretta Cool, BSN, RN, CWS, Kim RN, BSN Signed: 02/07/2022 5:04:29 PM By: Worthy Keeler PA-C Entered By: Massie Kluver on 02/06/2022 09:00:33 [name], [name]. ([id number]) -------------------------------------------------------------------------------- HPI Details Patient Name: [name]. Date of Service: 02/06/2022 8:15 AM Medical Record Number: [id number] Patient Account Number: [id number] Date of Birth/Sex: 04/14/1941 (81 y.o. M) Treating RN: Cornell Barman Primary Care Provider: [name] Other Clinician: Massie Kluver Referring Provider: [name] Treating Provider/Extender: Skipper Cliche in Treatment: 3 History of Present Illness HPI Description: 01-16-2022 upon evaluation today patient presents for initial inspection here in the clinic concerning issues that he has been having with wounds over the bilateral lower extremities and bilateral feet. Fortunately there does not appear to be any signs of significant infection at this point which is great news. Unfortunately his ABIs were registering at 0.49 on the left and 0.57 on the right here in the clinic on the screening today. With that being said I do think that it may possibly be true that we need to get him to formal arterial studies in order to see how things really are showing up. And the patient's not in disagreement with this for that reason we are going to make that referral as well. With that being said he does have some significant past medical history items of note detailed below. Patient does have chronic kidney disease stage IIIb, coronary artery disease, long-term use of anticoagulant therapy, dilated cardiomyopathy. Proximal atrial fibrillation, he is on Xarelto  long-term. He also has congestive heart failure, hypertension, peripheral neuropathy not related to diabetes as he is only prediabetic with a hemoglobin A1c of 5.8, and chronic venous hypertension. He has recently seen Dr. [name] show his cardiologist who did discontinue the hydrochlorothiazide and the Lasix and felt that the patient was retaining more fluid than he should be. For that reason  he was started on furosemide 20 mg twice daily. 7/27; patient with bilateral lower extremity wounds. These are largely venous although he had very poor ABIs on screening test here. He has an appointment apparently tomorrow at vein and vascular for arterial studies. He was not too keen on the latter although I think I emphasized the need to do this both in terms of dealing with the current wounds and being prepared for further issues down the road. We have been using silver alginate on the wounds and Tubigrip's edema control is better per our intake nurse 02-06-2022 upon evaluation today patient appears to be doing well with regard to his wound. Fortunately there is no signs of infection he is actually healing for the most part on the bilateral feet. His legs are also doing much better though he has 1 new area that popped up on the right leg. Fortunately there is no evidence of active infection of note his ABIs and TBI's were poor on the vascular screening on the 31st with the left being worse than the right. For that reason I am going to recommend he still continue to follow-up with vascular to discuss angioplasty/stenting. Electronic Signature(s) Signed: 02/06/2022 9:07:27 AM By: Worthy Keeler PA-C Entered By: Worthy Keeler on 02/06/2022 09:07:27 [name], [name] ([id number]) -------------------------------------------------------------------------------- Physical Exam Details Patient Name: [name]. Date of Service: 02/06/2022 8:15 AM Medical Record Number: [id number] Patient Account Number:  [id number] Date of Birth/Sex: 08-14-1940 (81 y.o. M) Treating RN: Cornell Barman Primary Care Provider: [name] Other Clinician: Massie Kluver Referring Provider: [name] Treating Provider/Extender: [name] Weeks in Treatment: 3 Constitutional Well-nourished and well-hydrated in no acute distress. Respiratory normal breathing without difficulty. Psychiatric this patient is able to make decisions and demonstrates good insight into disease process. Alert and Oriented x 3. pleasant and cooperative. Notes Upon inspection I did perform some debridement to clear away some of the callus around the edges of the wound on the right foot he tolerated that today without complication and postdebridement the wound bed appears to be doing much better which is great news. Electronic Signature(s) Signed: 02/06/2022 9:07:41 AM By: Worthy Keeler PA-C Entered By: Worthy Keeler on 02/06/2022 09:07:40 Pitcock, [name] ([id number]) -------------------------------------------------------------------------------- Physician Orders Details Patient Name: [name]. Date of Service: 02/06/2022 8:15 AM Medical Record Number: [id number] Patient Account Number: [id number] Date of Birth/Sex: 15-Aug-1940 (81 y.o. M) Treating RN: Cornell Barman Primary Care Provider: [name] Other Clinician: Massie Kluver Referring Provider: [name] Treating Provider/Extender: Skipper Cliche in Treatment: 3 Verbal / Phone Orders: No Diagnosis Coding ICD-10 Coding Code Description [phone] Chronic venous hypertension (idiopathic) with ulcer and inflammation of bilateral lower extremity L97.812 Non-pressure chronic ulcer of other part of right lower leg with fat layer exposed L97.822 Non-pressure chronic ulcer of other part of left lower leg with fat layer exposed G90.09 Other idiopathic peripheral autonomic neuropathy L97.522 Non-pressure chronic ulcer of other part of left foot with fat layer  exposed L97.512 Non-pressure chronic ulcer of other part of right foot with fat layer exposed I10 Essential (primary) hypertension I50.42 Chronic combined systolic (congestive) and diastolic (congestive) heart failure I48.0 Paroxysmal atrial fibrillation I42.0 Dilated cardiomyopathy Z79.01 Long term (current) use of anticoagulants I25.10 Atherosclerotic heart disease of native coronary artery without angina pectoris N18.32 Chronic kidney disease, stage 3b Follow-up Appointments o Return Appointment in 1 week. o Nurse Visit as needed Bathing/ Shower/ Hygiene o May shower with wound dressing protected with water repellent  cover or cast protector. Anesthetic (Use 'Patient Medications' Section for Anesthetic Order Entry) o Lidocaine applied to wound bed Edema Control - Lymphedema / Segmental Compressive Device / Other o Tubigrip single layer applied. - tubi E bilat lower legs o Elevate, Exercise Daily and Avoid Standing for Long Periods of Time. o Elevate legs to the level of the heart and pump ankles as often as possible o Elevate leg(s) parallel to the floor when sitting. Wound Treatment Wound #2 - Foot Wound Laterality: Plantar, Right Cleanser: Wound Cleanser 1 x Per Week/30 Days Discharge Instructions: Wash your hands with soap and water. Remove old dressing, discard into plastic bag and place into trash. Cleanse the wound with Wound Cleanser prior to applying a clean dressing using gauze sponges, not tissues or cotton balls. Do not scrub or use excessive force. Pat dry using gauze sponges, not tissue or cotton balls. Primary Dressing: Silvercel Small 2x2 (in/in) 1 x Per Week/30 Days Discharge Instructions: Apply Silvercel Small 2x2 (in/in) as instructed Secondary Dressing: Gauze 1 x Per Week/30 Days Discharge Instructions: As directed: dry, moistened with saline or moistened with Dakins Solution Secured With: Medipore Tape - 34M Medipore H Soft Cloth Surgical Tape, 2x2  (in/yd) 1 x Per Week/30 Days Secured With: Tubigrip Size E, 3.5x10 (in/yds) 1 x Per Week/30 Days Discharge Instructions: Apply 3 Tubigrip E 3-finger-widths below knee to base of toes to secure dressing and/or for swelling. Wound #5 - Lower Leg Wound Laterality: Right, Anterior Grams, Braian [name]. ([id number]) Cleanser: Byram Ancillary Kit - 15 Day Supply Discharge Instructions: Use supplies as instructed; Kit contains: (15) Saline Bullets; (15) 3x3 Gauze; 15 pr Gloves Cleanser: Normal Saline Discharge Instructions: Wash your hands with soap and water. Remove old dressing, discard into plastic bag and place into trash. Cleanse the wound with Normal Saline prior to applying a clean dressing using gauze sponges, not tissues or cotton balls. Do not scrub or use excessive force. Pat dry using gauze sponges, not tissue or cotton balls. Primary Dressing: Silvercel Small 2x2 (in/in) Discharge Instructions: Apply Silvercel Small 2x2 (in/in) as instructed Secondary Dressing: Gauze Discharge Instructions: As directed: dry, moistened with saline or moistened with Dakins Solution Secured With: Medipore Tape - 34M Medipore H Soft Cloth Surgical Tape, 2x2 (in/yd) Electronic Signature(s) Signed: 02/06/2022 9:36:17 AM By: Massie Kluver Signed: 02/07/2022 5:04:29 PM By: Worthy Keeler PA-C Entered By: Massie Kluver on 02/06/2022 09:07:38 [name], [name]. ([id number]) -------------------------------------------------------------------------------- Problem List Details Patient Name: [name]. Date of Service: 02/06/2022 8:15 AM Medical Record Number: [id number] Patient Account Number: [id number] Date of Birth/Sex: 28-Jun-1941 (81 y.o. M) Treating RN: Cornell Barman Primary Care Provider: [name] Other Clinician: Massie Kluver Referring Provider: [name] Treating Provider/Extender: Skipper Cliche in Treatment: 3 Active Problems ICD-10 Encounter Code Description Active Date  MDM Diagnosis I87.333 Chronic venous hypertension (idiopathic) with ulcer and inflammation of 01/16/2022 No Yes bilateral lower extremity L97.812 Non-pressure chronic ulcer of other part of right lower leg with fat layer 01/16/2022 No Yes exposed L97.822 Non-pressure chronic ulcer of other part of left lower leg with fat layer 01/16/2022 No Yes exposed G90.09 Other idiopathic peripheral autonomic neuropathy 01/16/2022 No Yes L97.522 Non-pressure chronic ulcer of other part of left foot with fat layer 01/16/2022 No Yes exposed L97.512 Non-pressure chronic ulcer of other part of right foot with fat layer 01/16/2022 No Yes exposed [place] (primary) hypertension 01/16/2022 No Yes I50.42 Chronic combined systolic (congestive) and diastolic (congestive) heart 01/16/2022 No Yes failure I48.0 Paroxysmal atrial  fibrillation 01/16/2022 No Yes I42.0 Dilated cardiomyopathy 01/16/2022 No Yes Z79.01 Long term (current) use of anticoagulants 01/16/2022 No Yes I25.10 Atherosclerotic heart disease of native coronary artery without angina 01/16/2022 No Yes pectoris [name], [name]. ([id number]) N18.32 Chronic kidney disease, stage 3b 01/16/2022 No Yes Inactive Problems Resolved Problems Electronic Signature(s) Signed: 02/06/2022 8:19:22 AM By: Worthy Keeler PA-C Entered By: Worthy Keeler on 02/06/2022 08:19:21 [name], [name]. ([id number]) -------------------------------------------------------------------------------- Progress Note Details Patient Name: [name]. Date of Service: 02/06/2022 8:15 AM Medical Record Number: [id number] Patient Account Number: [id number] Date of Birth/Sex: 11/11/1940 (81 y.o. M) Treating RN: Cornell Barman Primary Care Provider: [name] Other Clinician: Massie Kluver Referring Provider: [name] Treating Provider/Extender: Skipper Cliche in Treatment: 3 Subjective Chief Complaint Information obtained from Patient Multiple LE Ulcers History of  Present Illness (HPI) 01-16-2022 upon evaluation today patient presents for initial inspection here in the clinic concerning issues that he has been having with wounds over the bilateral lower extremities and bilateral feet. Fortunately there does not appear to be any signs of significant infection at this point which is great news. Unfortunately his ABIs were registering at 0.49 on the left and 0.57 on the right here in the clinic on the screening today. With that being said I do think that it may possibly be true that we need to get him to formal arterial studies in order to see how things really are showing up. And the patient's not in disagreement with this for that reason we are going to make that referral as well. With that being said he does have some significant past medical history items of note detailed below. Patient does have chronic kidney disease stage IIIb, coronary artery disease, long-term use of anticoagulant therapy, dilated cardiomyopathy. Proximal atrial fibrillation, he is on Xarelto long-term. He also has congestive heart failure, hypertension, peripheral neuropathy not related to diabetes as he is only prediabetic with a hemoglobin A1c of 5.8, and chronic venous hypertension. He has recently seen Dr. [name] show his cardiologist who did discontinue the hydrochlorothiazide and the Lasix and felt that the patient was retaining more fluid than he should be. For that reason he was started on furosemide 20 mg twice daily. 7/27; patient with bilateral lower extremity wounds. These are largely venous although he had very poor ABIs on screening test here. He has an appointment apparently tomorrow at vein and vascular for arterial studies. He was not too keen on the latter although I think I emphasized the need to do this both in terms of dealing with the current wounds and being prepared for further issues down the road. We have been using silver alginate on the wounds and Tubigrip's  edema control is better per our intake nurse 02-06-2022 upon evaluation today patient appears to be doing well with regard to his wound. Fortunately there is no signs of infection he is actually healing for the most part on the bilateral feet. His legs are also doing much better though he has 1 new area that popped up on the right leg. Fortunately there is no evidence of active infection of note his ABIs and TBI's were poor on the vascular screening on the 31st with the left being worse than the right. For that reason I am going to recommend he still continue to follow-up with vascular to discuss angioplasty/stenting. Objective Constitutional Well-nourished and well-hydrated in no acute distress. Vitals Time Taken: 8:15 AM, Height: 74 in, Weight: 244 lbs, BMI: 31.3, Temperature: 98.  F, Pulse: 60 bpm, Respiratory Rate: 18 breaths/min, Blood Pressure: 130/66 mmHg. Respiratory normal breathing without difficulty. Psychiatric this patient is able to make decisions and demonstrates good insight into disease process. Alert and Oriented x 3. pleasant and cooperative. General Notes: Upon inspection I did perform some debridement to clear away some of the callus around the edges of the wound on the right foot he tolerated that today without complication and postdebridement the wound bed appears to be doing much better which is great news. Integumentary (Hair, Skin) Wound #1 status is Healed - Epithelialized. Original cause of wound was Gradually Appeared. The date acquired was: 11/28/2021. The wound has been in treatment 3 weeks. The wound is located on the Right,Lateral Lower Leg. The wound measures 0cm length x 0cm width x 0cm depth; 0cm^2 area and 0cm^3 volume. There is Fat Layer (Subcutaneous Tissue) exposed. There is a medium amount of serosanguineous drainage noted. There is large (67-100%) red, pink granulation within the wound bed. There is no necrotic tissue within the wound bed. Wound #2 status  is Open. Original cause of wound was Gradually Appeared. The date acquired was: 12/28/2020. The wound has been in treatment [name], [name]. ([id number]) 3 weeks. The wound is located on the [place]. The wound measures 0.4cm length x 0.2cm width x 0.4cm depth; 0.063cm^2 area and 0.025cm^3 volume. There is Fat Layer (Subcutaneous Tissue) exposed. There is no tunneling or undermining noted. There is a medium amount of serosanguineous drainage noted. There is medium (34-66%) red granulation within the wound bed. There is a medium (34-66%) amount of necrotic tissue within the wound bed including Adherent Slough. Wound #3 status is Healed - Epithelialized. Original cause of wound was Gradually Appeared. The date acquired was: 12/28/2020. The wound has been in treatment 3 weeks. The wound is located on the [place]. The wound measures 0cm length x 0cm width x 0cm depth; 0cm^2 area and 0cm^3 volume. There is a none present amount of drainage noted. There is no granulation within the wound bed. There is a large (67- 100%) amount of necrotic tissue within the wound bed. Wound #4 status is Healed - Epithelialized. Original cause of wound was Gradually Appeared. The date acquired was: 06/30/2021. The wound has been in treatment 3 weeks. The wound is located on the Left,Anterior Lower Leg. The wound measures 0cm length x 0cm width x 0cm depth; 0cm^2 area and 0cm^3 volume. There is a none present amount of drainage noted. There is no granulation within the wound bed. There is a large (67-100%) amount of necrotic tissue within the wound bed. Wound #5 status is Open. Original cause of wound was Shear/Friction. The date acquired was: 02/04/2022. The wound is located on the Right,Anterior Lower Leg. The wound measures 2.5cm length x 1.2cm width x 0.1cm depth; 2.356cm^2 area and 0.236cm^3 volume. There is no tunneling or undermining noted. There is a medium amount of serosanguineous drainage noted. There  is no granulation within the wound bed. There is no necrotic tissue within the wound bed. Assessment Active Problems ICD-10 Chronic venous hypertension (idiopathic) with ulcer and inflammation of bilateral lower extremity Non-pressure chronic ulcer of other part of right lower leg with fat layer exposed Non-pressure chronic ulcer of other part of left lower leg with fat layer exposed Other idiopathic peripheral autonomic neuropathy Non-pressure chronic ulcer of other part of left foot with fat layer exposed Non-pressure chronic ulcer of other part of right foot with fat layer exposed Essential (primary) hypertension Chronic combined  systolic (congestive) and diastolic (congestive) heart failure Paroxysmal atrial fibrillation Dilated cardiomyopathy Long term (current) use of anticoagulants Atherosclerotic heart disease of native coronary artery without angina pectoris Chronic kidney disease, stage 3b Procedures Wound #2 Pre-procedure diagnosis of Wound #2 is a Neuropathic Ulcer-Non Diabetic located on the [place] . There was a Excisional Skin/Subcutaneous Tissue Debridement with a total area of 1 sq cm performed by Tommie Sams., PA-C. With the following instrument(s): Curette to remove Viable and Non-Viable tissue/material. Material removed includes Callus, Subcutaneous Tissue, and Slough. A time out was conducted at 08:57, prior to the start of the procedure. A Minimum amount of bleeding was controlled with Pressure. The procedure was tolerated well. Post Debridement Measurements: 0.5cm length x 0.3cm width x 0.5cm depth; 0.059cm^3 volume. Character of Wound/Ulcer Post Debridement is stable. Post procedure Diagnosis Wound #2: Same as Pre-Procedure Plan Follow-up Appointments: Return Appointment in 1 week. Nurse Visit as needed Bathing/ Shower/ Hygiene: May shower with wound dressing protected with water repellent cover or cast protector. Anesthetic (Use 'Patient  Medications' Section for Anesthetic Order Entry): Lidocaine applied to wound bed Edema Control - Lymphedema / Segmental Compressive Device / Other: Tubigrip single layer applied. - tubi E bilat lower legs Elevate, Exercise Daily and Avoid Standing for Long Periods of Time. [name], [name]. ([id number]) Elevate legs to the level of the heart and pump ankles as often as possible Elevate leg(s) parallel to the floor when sitting. WOUND #2: - Foot Wound Laterality: Plantar, Right Cleanser: Wound Cleanser 1 x Per Week/30 Days Discharge Instructions: Wash your hands with soap and water. Remove old dressing, discard into plastic bag and place into trash. Cleanse the wound with Wound Cleanser prior to applying a clean dressing using gauze sponges, not tissues or cotton balls. Do not scrub or use excessive force. Pat dry using gauze sponges, not tissue or cotton balls. Primary Dressing: Silvercel Small 2x2 (in/in) 1 x Per Week/30 Days Discharge Instructions: Apply Silvercel Small 2x2 (in/in) as instructed Secondary Dressing: Gauze 1 x Per Week/30 Days Discharge Instructions: As directed: dry, moistened with saline or moistened with Dakins Solution Secured With: Medipore Tape - 36M Medipore H Soft Cloth Surgical Tape, 2x2 (in/yd) 1 x Per Week/30 Days Secured With: Tubigrip Size E, 3.5x10 (in/yds) 1 x Per Week/30 Days Discharge Instructions: Apply 3 Tubigrip E 3-finger-widths below knee to base of toes to secure dressing and/or for swelling. WOUND #5: - Lower Leg Wound Laterality: Right, Anterior Cleanser: Byram Ancillary Kit - 15 Day Supply Discharge Instructions: Use supplies as instructed; Kit contains: (15) Saline Bullets; (15) 3x3 Gauze; 15 pr Gloves Cleanser: Normal Saline Discharge Instructions: Wash your hands with soap and water. Remove old dressing, discard into plastic bag and place into trash. Cleanse the wound with Normal Saline prior to applying a clean dressing using gauze sponges, not  tissues or cotton balls. Do not scrub or use excessive force. Pat dry using gauze sponges, not tissue or cotton balls. Primary Dressing: Silvercel Small 2x2 (in/in) Discharge Instructions: Apply Silvercel Small 2x2 (in/in) as instructed Secondary Dressing: Gauze Discharge Instructions: As directed: dry, moistened with saline or moistened with Dakins Solution Secured With: Medipore Tape - 36M Medipore H Soft Cloth Surgical Tape, 2x2 (in/yd) 1. I am good recommend that we go ahead and continue with the wound care measures as before and the patient is in agreement with plan. This includes the use of the silver alginate dressing all around which I think is doing a good job. 2. Also  can recommend that we have the patient continue to monitor for any signs of worsening or infection if anything changes he should let me know. 3. With regard to the vascular status I do think it still would be a good idea for him to follow-up with vascular in order to evaluate for any intervention that could help with his blood flow long-term he voiced understanding he does have an appointment with them on 7 September. We will see patient back for reevaluation in 1 week here in the clinic. If anything worsens or changes patient will contact our office for additional recommendations. Electronic Signature(s) Signed: 02/06/2022 9:08:18 AM By: Worthy Keeler PA-C Entered By: Worthy Keeler on 02/06/2022 09:08:17 Rapley, Arhan [name]. ([id number]) -------------------------------------------------------------------------------- SuperBill Details Patient Name: [name]. Date of Service: 02/06/2022 Medical Record Number: [id number] Patient Account Number: [id number] Date of Birth/Sex: [birth year] (81 y.o. M) Treating RN: Cornell Barman Primary Care Provider: [name] Other Clinician: Massie Kluver Referring Provider: [name] Treating Provider/Extender: Skipper Cliche in Treatment: 3 Diagnosis Coding ICD-10  Codes Code Description [phone] Chronic venous hypertension (idiopathic) with ulcer and inflammation of bilateral lower extremity L97.812 Non-pressure chronic ulcer of other part of right lower leg with fat layer exposed L97.822 Non-pressure chronic ulcer of other part of left lower leg with fat layer exposed G90.09 Other idiopathic peripheral autonomic neuropathy L97.522 Non-pressure chronic ulcer of other part of left foot with fat layer exposed L97.512 Non-pressure chronic ulcer of other part of right foot with fat layer exposed I10 Essential (primary) hypertension I50.42 Chronic combined systolic (congestive) and diastolic (congestive) heart failure I48.0 Paroxysmal atrial fibrillation I42.0 Dilated cardiomyopathy Z79.01 Long term (current) use of anticoagulants I25.10 Atherosclerotic heart disease of native coronary artery without angina pectoris N18.32 Chronic kidney disease, stage 3b Facility Procedures CPT4 Code: 68088110 Description: 31594 - DEB SUBQ TISSUE 20 SQ CM/< Modifier: Quantity: 1 CPT4 Code: Description: ICD-10 Diagnosis Description L97.512 Non-pressure chronic ulcer of other part of right foot with fat layer ex Modifier: posed Quantity: Physician Procedures CPT4 Code: 5859292 Description: 11042 - WC PHYS SUBQ TISS 20 SQ CM Modifier: Quantity: 1 CPT4 Code: Description: ICD-10 Diagnosis Description L97.512 Non-pressure chronic ulcer of other part of right foot with fat layer ex Modifier: posed Quantity: Electronic Signature(s) Signed: 02/06/2022 9:08:36 AM By: Worthy Keeler PA-C Entered By: Worthy Keeler on 02/06/2022 09:08:36

## 2022-02-06 NOTE — Progress Notes (Signed)
NNAMDI, DACUS ([id number]) Visit Report for 02/06/2022 Arrival Information Details Patient Name: [name], [name]. Date of Service: 02/06/2022 8:15 AM Medical Record Number: [id number] Patient Account Number: [id number] Date of Birth/Sex: [birth year] (81 y.o. M) Treating RN: Cornell Barman Primary Care Provider: [name] Other Clinician: Massie Kluver Referring Provider: [name] Treating Provider/Extender: Skipper Cliche in Treatment: 3 Visit Information History Since Last Visit All ordered tests and consults were completed: No Patient Arrived: [name] Added or deleted any medications: No Arrival Time: 08:15 Any new allergies or adverse reactions: No Transfer Assistance: None Had a fall or experienced change in No Patient Requires Transmission-Based Precautions: No activities of daily living that may affect Patient Has Alerts: No risk of falls: Hospitalized since last visit: No Pain Present Now: No Electronic Signature(s) Signed: 02/06/2022 9:36:17 AM By: Massie Kluver Entered By: Massie Kluver on 02/06/2022 08:15:37 Lefebre, Jahson D. ([id number]) -------------------------------------------------------------------------------- Clinic Level of Care Assessment Details Patient Name: [name] Date of Service: 02/06/2022 8:15 AM Medical Record Number: [id number] Patient Account Number: [id number] Date of Birth/Sex: [birth year] (81 y.o. M) Treating RN: Cornell Barman Primary Care Provider: [name] Other Clinician: Massie Kluver Referring Provider: [name] Treating Provider/Extender: Skipper Cliche in Treatment: 3 Clinic Level of Care Assessment Items TOOL 1 Quantity Score [] - Use when EandM and Procedure is performed on INITIAL visit 0 ASSESSMENTS - Nursing Assessment / Reassessment [] - General Physical Exam (combine w/ comprehensive assessment (listed just below) when performed on new 0 pt. evals) [] - 0 Comprehensive Assessment (HX, ROS, Risk  Assessments, Wounds Hx, etc.) ASSESSMENTS - Wound and Skin Assessment / Reassessment [] - Dermatologic / Skin Assessment (not related to wound area) 0 ASSESSMENTS - Ostomy and/or Continence Assessment and Care [] - Incontinence Assessment and Management 0 [] - 0 Ostomy Care Assessment and Management (repouching, etc.) PROCESS - Coordination of Care [] - Simple Patient / Family Education for ongoing care 0 [] - 0 Complex (extensive) Patient / Family Education for ongoing care [] - 0 Staff obtains Consents, Records, Test Results / Process Orders [] - 0 Staff telephones HHA, Nursing Homes / Clarify orders / etc [] - 0 Routine Transfer to another Facility (non-emergent condition) [] - 0 Routine Hospital Admission (non-emergent condition) [] - 0 New Admissions / Biomedical engineer / Ordering NPWT, Apligraf, etc. [] - 0 Emergency Hospital Admission (emergent condition) PROCESS - Special Needs [] - Pediatric / Minor Patient Management 0 [] - 0 Isolation Patient Management [] - 0 Hearing / Language / Visual special needs [] - 0 Assessment of Community assistance (transportation, D/C planning, etc.) [] - 0 Additional assistance / Altered mentation [] - 0 Support Surface(s) Assessment (bed, cushion, seat, etc.) INTERVENTIONS - Miscellaneous [] - External ear exam 0 [] - 0 Patient Transfer (multiple staff / Civil Service fast streamer / Similar devices) [] - 0 Simple Staple / Suture removal (25 or less) [] - 0 Complex Staple / Suture removal (26 or more) [] - 0 Hypo/Hyperglycemic Management (do not check if billed separately) [] - 0 Ankle / Brachial Index (ABI) - do not check if billed separately Has the patient been seen at the hospital within the last three years: Yes Total Score: 0 Level Of Care: ____ [name] ([id number]) Electronic Signature(s) Signed: 02/06/2022 9:36:17 AM By: Massie Kluver Entered By: Massie Kluver on 02/06/2022 09:07:45 Jaspers, Alexandre D.  ([id number]) -------------------------------------------------------------------------------- Encounter Discharge Information Details Patient Name: [name] Date of Service: 02/06/2022 8:15 AM Medical  Record Number: [id number] Patient Account Number: [id number] Date of Birth/Sex: 1940-08-21 (81 y.o. M) Treating RN: Cornell Barman Primary Care Provider: [name] Other Clinician: Massie Kluver Referring Provider: [name] Treating Provider/Extender: Skipper Cliche in Treatment: 3 Encounter Discharge Information Items Post Procedure Vitals Discharge Condition: Stable Temperature (F): 98.0 Ambulatory Status: Walker Pulse (bpm): 60 Discharge Destination: Home Respiratory Rate (breaths/min): 18 Transportation: Private Auto Blood Pressure (mmHg): 130/66 Accompanied By: self Schedule Follow-up Appointment: Yes Clinical Summary of Care: Electronic Signature(s) Signed: 02/06/2022 9:36:17 AM By: Massie Kluver Entered By: Massie Kluver on 02/06/2022 09:16:31 Mau, Ronney D. ([id number]) -------------------------------------------------------------------------------- Lower Extremity Assessment Details Patient Name: [name] Date of Service: 02/06/2022 8:15 AM Medical Record Number: [id number] Patient Account Number: [id number] Date of Birth/Sex: [birth year] (81 y.o. M) Treating RN: Cornell Barman Primary Care Provider: [name] Other Clinician: Massie Kluver Referring Provider: [name] Treating Provider/Extender: [name] Weeks in Treatment: 3 Edema Assessment Assessed: [name]: Yes] [name]: Yes] Edema: [Left: Yes] [Right: Yes] Calf Left: Right: Point of Measurement: 38 cm From Medial Instep 37.3 cm 39.8 cm Ankle Left: Right: Point of Measurement: 15 cm From Medial Instep 26 cm 27.8 cm Vascular Assessment Pulses: Dorsalis Pedis Palpable: [Left:Yes] [Right:Yes] Electronic Signature(s) Signed: 02/06/2022 9:36:17 AM By: Massie Kluver Signed:  02/06/2022 11:26:52 AM By: Gretta Cool, BSN, RN, CWS, Kim RN, BSN Entered By: Massie Kluver on 02/06/2022 08:42:16 Koon, Wayman D. ([id number]) -------------------------------------------------------------------------------- Multi Wound Chart Details Patient Name: [name] Date of Service: 02/06/2022 8:15 AM Medical Record Number: [id number] Patient Account Number: [id number] Date of Birth/Sex: 01-19-1941 (81 y.o. M) Treating RN: Cornell Barman Primary Care Provider: [name] Other Clinician: Massie Kluver Referring Provider: [name] Treating Provider/Extender: [name] Weeks in Treatment: 3 Vital Signs Height(in): 74 Pulse(bpm): 60 Weight(lbs): 244 Blood Pressure(mmHg): 130/66 Body Mass Index(BMI): 31.3 Temperature(F): 98. Respiratory Rate(breaths/min): 18 Photos: Wound Location: Right, Lateral Lower Leg Right, Plantar Foot Left, Plantar Foot Wounding Event: Gradually Appeared Gradually Appeared Gradually Appeared Primary Etiology: Venous Leg Ulcer Neuropathic Ulcer-Non Diabetic Neuropathic Ulcer-Non Diabetic Comorbid History: Congestive Heart Failure, Congestive Heart Failure, Congestive Heart Failure, Hypertension Hypertension Hypertension Date Acquired: 11/28/2021 12/28/2020 12/28/2020 Weeks of Treatment: _0 Wound Status: Open Open Open Wound Recurrence: No No No Measurements L x W x D (cm) 0.1x0.1x0.1 0.4x0.2x0.4 0.1x0.1x0.1 Area (cm) : 0.008 0.063 0.008 Volume (cm) : 0.001 0.025 0.001 % Reduction in Area: 100.00% 20.30% 88.70% % Reduction in Volume: 100.00% -4.20% 92.90% Classification: Full Thickness Without Exposed Full Thickness Without Exposed Full Thickness Without Exposed Support Structures Support Structures Support Structures Exudate Amount: Medium Medium None Present Exudate Type: Serosanguineous Serosanguineous N/A Exudate Color: red, brown red, brown N/A Granulation Amount: Large (67-100%) Medium (34-66%) None Present (0%) Granulation Quality:  Red, Pink Red N/A Necrotic Amount: None Present (0%) Medium (34-66%) Large (67-100%) Exposed Structures: Fat Layer (Subcutaneous Tissue): Fat Layer (Subcutaneous Tissue): Fascia: No Yes Yes Fat Layer (Subcutaneous Tissue): Fascia: No No Tendon: No Tendon: No Muscle: No Muscle: No Joint: No Joint: No Bone: No Bone: No Epithelialization: Large (67-100%) None N/A Wound Number: 4 5 N/A Photos: N/A CALIXTO, PAVEL. ([id number]) Wound Location: Left, Anterior Lower Leg Right, Anterior Lower Leg N/A Wounding Event: Gradually Appeared Shear/Friction N/A Primary Etiology: Venous Leg Ulcer Skin Tear N/A Comorbid History: Congestive Heart Failure, Congestive Heart Failure, N/A Hypertension Hypertension Date Acquired: 06/30/2021 02/04/2022 N/A Weeks of Treatment: 3 0 N/A Wound Status: Open Open N/A Wound Recurrence: No No N/A Measurements L x W x D (cm)  0.1x0.1x0.1 2.5x1.2x0.1 N/A Area (cm) : 0.008 2.356 N/A Volume (cm) : 0.001 0.236 N/A % Reduction in Area: 98.90% N/A N/A % Reduction in Volume: 98.70% N/A N/A Classification: Full Thickness Without Exposed Full Thickness Without Exposed N/A Support Structures Support Structures Exudate Amount: None Present Medium N/A Exudate Type: N/A Serosanguineous N/A Exudate Color: N/A red, brown N/A Granulation Amount: None Present (0%) None Present (0%) N/A Granulation Quality: N/A N/A N/A Necrotic Amount: Large (67-100%) None Present (0%) N/A Exposed Structures: Fascia: No Fat Layer (Subcutaneous Tissue): N/A Fat Layer (Subcutaneous Tissue): No No Tendon: No Muscle: No Joint: No Bone: No Epithelialization: Large (67-100%) None N/A Treatment Notes Electronic Signature(s) Signed: 02/06/2022 9:36:17 AM By: Massie Kluver Entered By: Massie Kluver on 02/06/2022 08:42:30 Verno, Charan D. ([id number]) -------------------------------------------------------------------------------- [place] Details Patient Name:  [name] Date of Service: 02/06/2022 8:15 AM Medical Record Number: [id number] Patient Account Number: [id number] Date of Birth/Sex: 10-04-1940 (81 y.o. M) Treating RN: Cornell Barman Primary Care Provider: [name] Other Clinician: Massie Kluver Referring Provider: [name] Treating Provider/Extender: Skipper Cliche in Treatment: 3 Active Inactive Wound/Skin Impairment Nursing Diagnoses: Knowledge deficit related to ulceration/compromised skin integrity Goals: Patient/caregiver will verbalize understanding of skin care regimen Date Initiated: 01/16/2022 Target Resolution Date: 02/16/2022 Goal Status: Active Ulcer/skin breakdown will have a volume reduction of 30% by week 4 Date Initiated: 01/16/2022 Target Resolution Date: 02/16/2022 Goal Status: Active Ulcer/skin breakdown will have a volume reduction of 50% by week 8 Date Initiated: 01/16/2022 Target Resolution Date: 03/19/2022 Goal Status: Active Ulcer/skin breakdown will have a volume reduction of 80% by week 12 Date Initiated: 01/16/2022 Target Resolution Date: 04/18/2022 Goal Status: Active Ulcer/skin breakdown will heal within 14 weeks Date Initiated: 01/16/2022 Target Resolution Date: 05/19/2022 Goal Status: Active Interventions: Assess patient/caregiver ability to obtain necessary supplies Assess patient/caregiver ability to perform ulcer/skin care regimen upon admission and as needed Assess ulceration(s) every visit Notes: Electronic Signature(s) Signed: 02/06/2022 9:36:17 AM By: Massie Kluver Signed: 02/06/2022 11:26:52 AM By: Gretta Cool, BSN, RN, CWS, Kim RN, BSN Entered By: Massie Kluver on 02/06/2022 08:42:24 Goettel, Nikash D. ([id number]) -------------------------------------------------------------------------------- Pain Assessment Details Patient Name: [name] Date of Service: 02/06/2022 8:15 AM Medical Record Number: [id number] Patient Account Number: [id number] Date of Birth/Sex:  [birth year] (81 y.o. M) Treating RN: Cornell Barman Primary Care Provider: [name] Other Clinician: Massie Kluver Referring Provider: [name] Treating Provider/Extender: Skipper Cliche in Treatment: 3 Active Problems Location of Pain Severity and Description of Pain Patient Has Paino No Site Locations Pain Management and Medication Current Pain Management: Electronic Signature(s) Signed: 02/06/2022 9:36:17 AM By: Massie Kluver Signed: 02/06/2022 11:26:52 AM By: Gretta Cool, BSN, RN, CWS, Kim RN, BSN Entered By: Massie Kluver on 02/06/2022 08:18:42 Weisenberger, [name] ([id number]) -------------------------------------------------------------------------------- Patient/Caregiver Education Details Patient Name: [name] Date of Service: 02/06/2022 8:15 AM Medical Record Number: [id number] Patient Account Number: [id number] Date of Birth/Gender: [birth year] (81 y.o. M) Treating RN: Cornell Barman Primary Care Physician: [name] Other Clinician: Massie Kluver Referring Physician: [name] Treating Physician/Extender: Skipper Cliche in Treatment: 3 Education Assessment Education Provided To: Patient Education Topics Provided Wound/Skin Impairment: Handouts: Other: continue wound care as directed Methods: Explain/Verbal Responses: State content correctly Electronic Signature(s) Signed: 02/06/2022 9:36:17 AM By: Massie Kluver Entered By: Massie Kluver on 02/06/2022 09:08:19 Mchale, Pepper D. ([id number]) -------------------------------------------------------------------------------- Wound Assessment Details Patient Name: [name] Date of Service: 02/06/2022 8:15 AM Medical Record Number: [id number] Patient Account Number: [id number] Date of Birth/Sex: Aug 01, 1940 (81  y.o. M) Treating RN: Cornell Barman Primary Care Tracye Szuch: [name] Other Clinician: Massie Kluver Referring Faron Tudisco: [name] Treating Dejuana Weist/Extender: [name] Weeks in  Treatment: 3 Wound Status Wound Number: 1 Primary Etiology: Venous Leg Ulcer Wound Location: Right, Lateral Lower Leg Wound Status: Healed - Epithelialized Wounding Event: Gradually Appeared Comorbid History: Congestive Heart Failure, Hypertension Date Acquired: 11/28/2021 Weeks Of Treatment: 3 Clustered Wound: No Photos Wound Measurements Length: (cm) 0 Width: (cm) 0 Depth: (cm) 0 Area: (cm) Volume: (cm) % Reduction in Area: 100% % Reduction in Volume: 100% Epithelialization: Large (67-100%) 0 0 Wound Description Classification: Full Thickness Without Exposed Support Structu Exudate Amount: Medium Exudate Type: Serosanguineous Exudate Color: red, brown res Foul Odor After Cleansing: No Slough/Fibrino Yes Wound Bed Granulation Amount: Large (67-100%) Exposed Structure Granulation Quality: Red, Pink Fascia Exposed: No Necrotic Amount: None Present (0%) Fat Layer (Subcutaneous Tissue) Exposed: Yes Tendon Exposed: No Muscle Exposed: No Joint Exposed: No Bone Exposed: No Treatment Notes Wound #1 (Lower Leg) Wound Laterality: Right, Lateral Cleanser Peri-Wound Care Topical Primary Dressing AERION, BAGDASARIAN ([id number]) Secondary Dressing Secured With Compression Wrap Compression Stockings Add-Ons Electronic Signature(s) Signed: 02/06/2022 9:36:17 AM By: Massie Kluver Signed: 02/06/2022 11:26:52 AM By: Gretta Cool, BSN, RN, CWS, Kim RN, BSN Entered By: Massie Kluver on 02/06/2022 08:53:48 Gerding, Novah D. ([id number]) -------------------------------------------------------------------------------- Wound Assessment Details Patient Name: [name] Date of Service: 02/06/2022 8:15 AM Medical Record Number: [id number] Patient Account Number: [id number] Date of Birth/Sex: 1941/04/24 (81 y.o. M) Treating RN: Cornell Barman Primary Care Tawan Degroote: [name] Other Clinician: Massie Kluver Referring Averyanna Sax: [name] Treating Donis Kotowski/Extender: [name] Weeks in Treatment: 3 Wound Status Wound Number: 2 Primary Etiology: Neuropathic Ulcer-Non Diabetic Wound Location: Right, Plantar Foot Wound Status: Open Wounding Event: Gradually Appeared Comorbid History: Congestive Heart Failure, Hypertension Date Acquired: 12/28/2020 Weeks Of Treatment: 3 Clustered Wound: No Photos Wound Measurements Length: (cm) 0.4 Width: (cm) 0.2 Depth: (cm) 0.4 Area: (cm) 0.063 Volume: (cm) 0.025 % Reduction in Area: 20.3% % Reduction in Volume: -4.2% Epithelialization: None Tunneling: No Undermining: No Wound Description Classification: Full Thickness Without Exposed Support Structures Exudate Amount: Medium Exudate Type: Serosanguineous Exudate Color: red, brown Foul Odor After Cleansing: No Slough/Fibrino Yes Wound Bed Granulation Amount: Medium (34-66%) Exposed Structure Granulation Quality: Red Fat Layer (Subcutaneous Tissue) Exposed: Yes Necrotic Amount: Medium (34-66%) Necrotic Quality: Adherent Slough Treatment Notes Wound #2 (Foot) Wound Laterality: Plantar, Right Cleanser Wound Cleanser Discharge Instruction: Wash your hands with soap and water. Remove old dressing, discard into plastic bag and place into trash. Cleanse the wound with Wound Cleanser prior to applying a clean dressing using gauze sponges, not tissues or cotton balls. Do not scrub or use excessive force. Pat dry using gauze sponges, not tissue or cotton balls. Peri-Wound Care Topical Zarzycki, Jusitn D. ([id number]) Primary Dressing Silvercel Small 2x2 (in/in) Discharge Instruction: Apply Silvercel Small 2x2 (in/in) as instructed Secondary Dressing Gauze Discharge Instruction: As directed: dry, moistened with saline or moistened with Dakins Solution Secured With Medipore Tape - 30M Medipore H Soft Cloth Surgical Tape, 2x2 (in/yd) Tubigrip Size E, 3.5x10 (in/yds) Discharge Instruction: Apply 3 Tubigrip E 3-finger-widths below knee to base of toes to secure  dressing and/or for swelling. Compression Wrap Compression Stockings Add-Ons Electronic Signature(s) Signed: 02/06/2022 9:36:17 AM By: Massie Kluver Signed: 02/06/2022 11:26:52 AM By: Gretta Cool, BSN, RN, CWS, Kim RN, BSN Entered By: Massie Kluver on 02/06/2022 08:38:08 DENIZ, HANNAN D. ([id number]) -------------------------------------------------------------------------------- Wound Assessment Details Patient Name: [name] Date of Service:  02/06/2022 8:15 AM Medical Record Number: [id number] Patient Account Number: [id number] Date of Birth/Sex: 1940/09/19 (81 y.o. M) Treating RN: Cornell Barman Primary Care Samarie Pinder: [name] Other Clinician: Massie Kluver Referring Celie Desrochers: [name] Treating Krisha Beegle/Extender: [name] Weeks in Treatment: 3 Wound Status Wound Number: 3 Primary Etiology: Neuropathic Ulcer-Non Diabetic Wound Location: Left, Plantar Foot Wound Status: Healed - Epithelialized Wounding Event: Gradually Appeared Comorbid History: Congestive Heart Failure, Hypertension Date Acquired: 12/28/2020 Weeks Of Treatment: 3 Clustered Wound: No Photos Wound Measurements Length: (cm) 0 Width: (cm) 0 Depth: (cm) 0 Area: (cm) 0 Volume: (cm) 0 % Reduction in Area: 100% % Reduction in Volume: 100% Wound Description Classification: Full Thickness Without Exposed Support Structure Exudate Amount: None Present s Foul Odor After Cleansing: No Slough/Fibrino No Wound Bed Granulation Amount: None Present (0%) Exposed Structure Necrotic Amount: Large (67-100%) Fascia Exposed: No Fat Layer (Subcutaneous Tissue) Exposed: No Tendon Exposed: No Muscle Exposed: No Joint Exposed: No Bone Exposed: No Treatment Notes Wound #3 (Foot) Wound Laterality: Plantar, Left Cleanser Peri-Wound Care Topical Primary Dressing DUANNE, DUCHESNE ([id number]) Secondary Dressing Secured With Compression Wrap Compression Stockings Add-Ons Electronic Signature(s) Signed:  02/06/2022 9:36:17 AM By: Massie Kluver Signed: 02/06/2022 11:26:52 AM By: Gretta Cool, BSN, RN, CWS, Kim RN, BSN Entered By: Massie Kluver on 02/06/2022 08:55:40 Dicicco, Devrin D. ([id number]) -------------------------------------------------------------------------------- Wound Assessment Details Patient Name: [name] Date of Service: 02/06/2022 8:15 AM Medical Record Number: [id number] Patient Account Number: [id number] Date of Birth/Sex: Jan 26, 1941 (81 y.o. M) Treating RN: Cornell Barman Primary Care Daunte Oestreich: [name] Other Clinician: Massie Kluver Referring Bruk Tumolo: [name] Treating Breella Vanostrand/Extender: [name] Weeks in Treatment: 3 Wound Status Wound Number: 4 Primary Etiology: Venous Leg Ulcer Wound Location: Left, Anterior Lower Leg Wound Status: Healed - Epithelialized Wounding Event: Gradually Appeared Comorbid History: Congestive Heart Failure, Hypertension Date Acquired: 06/30/2021 Weeks Of Treatment: 3 Clustered Wound: No Photos Wound Measurements Length: (cm) 0 Width: (cm) 0 Depth: (cm) 0 Area: (cm) 0 Volume: (cm) 0 % Reduction in Area: 100% % Reduction in Volume: 100% Epithelialization: Large (67-100%) Wound Description Classification: Full Thickness Without Exposed Support Structures Exudate Amount: None Present Foul Odor After Cleansing: No Slough/Fibrino Yes Wound Bed Granulation Amount: None Present (0%) Exposed Structure Necrotic Amount: Large (67-100%) Fascia Exposed: No Fat Layer (Subcutaneous Tissue) Exposed: No Tendon Exposed: No Muscle Exposed: No Joint Exposed: No Bone Exposed: No Treatment Notes Wound #4 (Lower Leg) Wound Laterality: Left, Anterior Cleanser Peri-Wound Care Topical Primary Dressing GEORGIOS, KINA ([id number]) Secondary Dressing Secured With Compression Wrap Compression Stockings Add-Ons Electronic Signature(s) Signed: 02/06/2022 9:36:17 AM By: Massie Kluver Signed: 02/06/2022 11:26:52 AM By:  Gretta Cool, BSN, RN, CWS, Kim RN, BSN Entered By: Massie Kluver on 02/06/2022 08:53:49 Mangus, Devaughn D. ([id number]) -------------------------------------------------------------------------------- Wound Assessment Details Patient Name: [name] Date of Service: 02/06/2022 8:15 AM Medical Record Number: [id number] Patient Account Number: [id number] Date of Birth/Sex: [birth year] (81 y.o. M) Treating RN: Cornell Barman Primary Care Arcelia Pals: [name] Other Clinician: Massie Kluver Referring Maceo Hernan: [name] Treating Jahmier Willadsen/Extender: [name] Weeks in Treatment: 3 Wound Status Wound Number: 5 Primary Etiology: Skin Tear Wound Location: Right, Anterior Lower Leg Wound Status: Open Wounding Event: Shear/Friction Comorbid History: Congestive Heart Failure, Hypertension Date Acquired: 02/04/2022 Weeks Of Treatment: 0 Clustered Wound: No Photos Wound Measurements Length: (cm) 2.5 Width: (cm) 1.2 Depth: (cm) 0.1 Area: (cm) 2.356 Volume: (cm) 0.236 % Reduction in Area: % Reduction in Volume: Epithelialization: None Tunneling: No Undermining: No Wound Description Classification: Full Thickness Without  Exposed Support Structures Exudate Amount: Medium Exudate Type: Serosanguineous Exudate Color: red, brown Foul Odor After Cleansing: No Slough/Fibrino Yes Wound Bed Granulation Amount: None Present (0%) Exposed Structure Necrotic Amount: None Present (0%) Fat Layer (Subcutaneous Tissue) Exposed: No Treatment Notes Wound #5 (Lower Leg) Wound Laterality: Right, Anterior Cleanser [place] Ancillary Kit - 15 Day Supply Discharge Instruction: Use supplies as instructed; Kit contains: (15) Saline Bullets; (15) 3x3 Gauze; 15 pr Gloves Normal Saline Discharge Instruction: Wash your hands with soap and water. Remove old dressing, discard into plastic bag and place into trash. Cleanse the wound with Normal Saline prior to applying a clean dressing using gauze sponges, not  tissues or cotton balls. Do not scrub or use excessive force. Pat dry using gauze sponges, not tissue or cotton balls. Peri-Wound Care Topical Munns, Timithy D. ([id number]) Primary Dressing Silvercel Small 2x2 (in/in) Discharge Instruction: Apply Silvercel Small 2x2 (in/in) as instructed Secondary Dressing Gauze Discharge Instruction: As directed: dry, moistened with saline or moistened with Dakins Solution Secured With Medipore Tape - 23M Medipore H Soft Cloth Surgical Tape, 2x2 (in/yd) Compression Wrap Compression Stockings Add-Ons Electronic Signature(s) Signed: 02/06/2022 9:36:17 AM By: Massie Kluver Signed: 02/06/2022 11:26:52 AM By: Gretta Cool, BSN, RN, CWS, Kim RN, BSN Entered By: Massie Kluver on 02/06/2022 08:36:29 Wellnitz, Ysidro D. ([id number]) -------------------------------------------------------------------------------- Vitals Details Patient Name: [name] Date of Service: 02/06/2022 8:15 AM Medical Record Number: [id number] Patient Account Number: [id number] Date of Birth/Sex: 29-Sep-1940 (81 y.o. M) Treating RN: Cornell Barman Primary Care Arshan Jabs: [name] Other Clinician: Massie Kluver Referring Nashira Mcglynn: [name] Treating Tab Rylee/Extender: Skipper [facility] in Treatment: 3 Vital Signs Time Taken: 08:15 Temperature (F): 98. Height (in): 74 Pulse (bpm): 60 Weight (lbs): 244 Respiratory Rate (breaths/min): 18 Body Mass Index (BMI): 31.3 Blood Pressure (mmHg): 130/66 Reference Range: 80 - 120 mg / dl Electronic Signature(s) Signed: 02/06/2022 9:36:17 AM By: Massie Kluver Entered By: Massie Kluver on 02/06/2022 74:82:70

## 2022-02-11 DIAGNOSIS — G8918 Other acute postprocedural pain: Secondary | ICD-10-CM | POA: Diagnosis not present

## 2022-02-11 DIAGNOSIS — E785 Hyperlipidemia, unspecified: Secondary | ICD-10-CM | POA: Diagnosis not present

## 2022-02-11 DIAGNOSIS — I4821 Permanent atrial fibrillation: Secondary | ICD-10-CM | POA: Diagnosis not present

## 2022-02-11 DIAGNOSIS — N1832 Chronic kidney disease, stage 3b: Secondary | ICD-10-CM | POA: Diagnosis not present

## 2022-02-11 DIAGNOSIS — I509 Heart failure, unspecified: Secondary | ICD-10-CM | POA: Diagnosis not present

## 2022-02-11 DIAGNOSIS — R82998 Other abnormal findings in urine: Secondary | ICD-10-CM | POA: Diagnosis not present

## 2022-02-11 DIAGNOSIS — R609 Edema, unspecified: Secondary | ICD-10-CM | POA: Diagnosis not present

## 2022-02-11 DIAGNOSIS — I10 Essential (primary) hypertension: Secondary | ICD-10-CM | POA: Diagnosis not present

## 2022-02-12 ENCOUNTER — Ambulatory Visit: Payer: Self-pay

## 2022-02-12 NOTE — Chronic Care Management (AMB) (Signed)
   02/12/2022  [name] Sr. [birth year] [id number]   Documentation encounter created to complete case transition. Goals closed. Pending appointments reviewed. Patient denies urgent care management needs. The care management team will continue to follow for care coordination.   [facility] [phone]

## 2022-02-13 NOTE — Progress Notes (Deleted)
Established patient visit   Patient: [name] CHAPLIN Sr.   DOB: 01-13-1941   81 y.o. Male  MRN: [id number] Visit Date: 02/18/2022  Today's healthcare provider: Gwyneth Sprout, FNP   No chief complaint on file.  Subjective    HPI   Hypertension, follow-up  BP Readings from Last 3 Encounters:  01/07/22 (!) 94/53  11/26/21 (!) 145/38  10/28/21 127/61   Wt Readings from Last 3 Encounters:  01/07/22 244 lb 4.8 oz (110.8 kg)  11/26/21 248 lb 14.4 oz (112.9 kg)  10/28/21 249 lb (112.9 kg)     He was last seen for hypertension 6 weeks ago.  BP at that visit was 94/53. Management since that visit includes continue medication.  He reports {excellent/good/fair/poor:19665} compliance with treatment. He {is/is not:9024} having side effects. {document side effects if present:1} He is following a {diet:21022986} diet. He {is/is not:9024} exercising. He {does/does not:200015} smoke.  Use of agents associated with hypertension: {bp agents assoc with hypertension:511::"none"}.   Outside blood pressures are {***enter patient reported home BP readings, or 'not being checked':1}. Symptoms: {Yes/No:20286} chest pain {Yes/No:20286} chest pressure  {Yes/No:20286} palpitations {Yes/No:20286} syncope  {Yes/No:20286} dyspnea {Yes/No:20286} orthopnea  {Yes/No:20286} paroxysmal nocturnal dyspnea {Yes/No:20286} lower extremity edema   Pertinent labs Lab Results  Component Value Date   CHOL 149 06/12/2021   HDL 40 06/12/2021   LDLCALC 85 06/12/2021   TRIG 135 06/12/2021   CHOLHDL 3.7 06/12/2021   Lab Results  Component Value Date   NA 140 01/07/2022   K 4.7 01/07/2022   CREATININE 1.84 (H) 01/07/2022   EGFR 37 (L) 01/07/2022   GLUCOSE 87 01/07/2022   TSH 1.240 04/04/2020     The 10-year ASCVD risk score (Arnett DK, et al., 2019) is: 33.3%  Lipid/Cholesterol, Follow-up  Last lipid panel Other pertinent labs  Lab Results  Component Value Date   CHOL 149 06/12/2021   HDL 40  06/12/2021   LDLCALC 85 06/12/2021   TRIG 135 06/12/2021   CHOLHDL 3.7 06/12/2021   Lab Results  Component Value Date   ALT 15 01/07/2022   AST 18 01/07/2022   PLT 226 01/07/2022   TSH 1.240 04/04/2020     He was last seen for this 6 weeks ago.  Management since that visit includes continue medication.  He reports {excellent/good/fair/poor:19665} compliance with treatment. He {is/is not:9024} having side effects. {document side effects if present:1}  Symptoms: {Yes/No:20286} chest pain {Yes/No:20286} chest pressure/discomfort  {Yes/No:20286} dyspnea {Yes/No:20286} lower extremity edema  {Yes/No:20286} numbness or tingling of extremity {Yes/No:20286} orthopnea  {Yes/No:20286} palpitations {Yes/No:20286} paroxysmal nocturnal dyspnea  {Yes/No:20286} speech difficulty {Yes/No:20286} syncope   Current diet: {diet habits:16563} Current exercise: {exercise types:16438}  The 10-year ASCVD risk score (Arnett DK, et al., 2019) is: 33.3%  ---------------------------------------------------------------------------------------------------  Coronary artery disease, follow up  The patient was last seen for CAD on 01/07/2022. Changes made at last visit include continue lasix. He {is/is not:320031} taking daily aspirin.  He reports {excellent/good/fair/poor:19665} compliance with treatment. He {is/is not:320031} having side effects. *** He {is/is not:320031} having to take nitroglycerine. He is experiencing {Symptoms; CAD:19184::"none"}. He is not experiencing {Symptoms; CAD:19184:o:"chest heaviness"}. He {is/is not:320031} able to carry groceries,     {is/is not:320031} able to climb stairs,      {is/is not:320031} able to cut grass,      {is/is not:320031} able to work in the yard without having above symptoms.  His last vist with his cardiologist was *** with ***.  Weight trend  Wt Readings from Last 3 Encounters:  01/07/22 244 lb 4.8 oz (110.8 kg)  11/26/21 248 lb 14.4 oz (112.9  kg)  10/28/21 249 lb (112.9 kg)    Lipid Panel     Component Value Date/Time   CHOL 149 06/12/2021 0911   TRIG 135 06/12/2021 0911   HDL 40 06/12/2021 0911   CHOLHDL 3.7 06/12/2021 0911   CHOLHDL 2.9 03/19/2017 0813   LDLCALC 85 06/12/2021 0911   LDLCALC 66 03/19/2017 1610    Last metabolic panel Lab Results  Component Value Date   GLUCOSE 87 01/07/2022   NA 140 01/07/2022   K 4.7 01/07/2022   CL 100 01/07/2022   CO2 23 01/07/2022   BUN 32 (H) 01/07/2022   CREATININE 1.84 (H) 01/07/2022   GFRNONAA 45 (L) 11/26/2021   GFRAA 39 (L) 04/04/2020   CALCIUM 9.6 01/07/2022   PHOS 2.9 06/02/2016   PROT 6.3 01/07/2022   ALBUMIN 3.8 01/07/2022   LABGLOB 2.5 01/07/2022   AGRATIO 1.5 01/07/2022   BILITOT 0.8 01/07/2022   ALKPHOS 109 01/07/2022   AST 18 01/07/2022   ALT 15 01/07/2022   ANIONGAP 11 11/26/2021    -----------------------------------------------------------------------------------------  ---------------------------------------------------------------------------------------------------   Medications: Outpatient Medications Prior to Visit  Medication Sig   amiodarone (PACERONE) 200 MG tablet Take 1 tablet (200 mg total) by mouth daily.   fluticasone (FLONASE) 50 MCG/ACT nasal spray Place 2 sprays into both nostrils daily.   furosemide (LASIX) 20 MG tablet TAKE 1 TABLET(20 MG) BY MOUTH DAILY AS NEEDED FOR FLUID RETENTION   gabapentin (NEURONTIN) 300 MG capsule Take 1 capsule (300 mg total) by mouth 2 (two) times daily.   hydrochlorothiazide (MICROZIDE) 12.5 MG capsule Take 1 capsule (12.5 mg total) by mouth daily.   levothyroxine (SYNTHROID) 100 MCG tablet TAKE 1 TABLET(100 MCG) BY MOUTH DAILY BEFORE BREAKFAST   lisinopril (ZESTRIL) 10 MG tablet Take 1 tablet (10 mg total) by mouth daily.   lovastatin (MEVACOR) 20 MG tablet Take 1 tablet (20 mg total) by mouth daily. (Patient not taking: Reported on 01/07/2022)   metoprolol succinate (TOPROL-XL) 25 MG 24 hr tablet 1  TABLET BY MOUTH ONCE A DAY   traMADol (ULTRAM) 50 MG tablet Take 50 mg by mouth 2 (two) times daily as needed.   XARELTO 20 MG TABS tablet Take 1 tablet (20 mg total) by mouth daily.   No facility-administered medications prior to visit.    Review of Systems  {Labs  Heme  Chem  Endocrine  Serology  Results Review (optional):23779}   Objective    There were no vitals taken for this visit. {Show previous vital signs (optional):23777}  Physical Exam  ***  No results found for any visits on 02/18/22.  Assessment & Plan     ***  No follow-ups on file.      {provider attestation***:1}   Gwyneth Sprout, [place] [phone] (phone) [phone] (fax)  [place]

## 2022-02-18 ENCOUNTER — Ambulatory Visit: Payer: PPO | Admitting: Family Medicine

## 2022-02-18 ENCOUNTER — Ambulatory Visit (INDEPENDENT_AMBULATORY_CARE_PROVIDER_SITE_OTHER): Payer: PPO | Admitting: Family Medicine

## 2022-02-18 ENCOUNTER — Encounter: Payer: Self-pay | Admitting: Family Medicine

## 2022-02-18 VITALS — BP 97/55 | HR 58 | Temp 98.0°F | Resp 16 | Ht 74.0 in | Wt 240.0 lb

## 2022-02-18 DIAGNOSIS — I420 Dilated cardiomyopathy: Secondary | ICD-10-CM | POA: Diagnosis not present

## 2022-02-18 DIAGNOSIS — N1832 Chronic kidney disease, stage 3b: Secondary | ICD-10-CM | POA: Diagnosis not present

## 2022-02-18 DIAGNOSIS — L97911 Non-pressure chronic ulcer of unspecified part of right lower leg limited to breakdown of skin: Secondary | ICD-10-CM

## 2022-02-18 DIAGNOSIS — I5040 Unspecified combined systolic (congestive) and diastolic (congestive) heart failure: Secondary | ICD-10-CM

## 2022-02-18 DIAGNOSIS — I5022 Chronic systolic (congestive) heart failure: Secondary | ICD-10-CM | POA: Diagnosis not present

## 2022-02-18 DIAGNOSIS — I4811 Longstanding persistent atrial fibrillation: Secondary | ICD-10-CM | POA: Diagnosis not present

## 2022-02-18 DIAGNOSIS — E039 Hypothyroidism, unspecified: Secondary | ICD-10-CM | POA: Diagnosis not present

## 2022-02-18 DIAGNOSIS — I10 Essential (primary) hypertension: Secondary | ICD-10-CM

## 2022-02-18 MED ORDER — LISINOPRIL 2.5 MG PO TABS: 2.5000 mg | ORAL_TABLET | Freq: Every day | ORAL | 0 refills | Status: DC

## 2022-02-18 NOTE — Progress Notes (Unsigned)
   SUBJECTIVE:   CHIEF COMPLAINT / HPI:   Hypertension, HFrEF, Afib: - Medications: metoprolol, torsemide, lisinopril, amiodarone, xarelto, lovastatin - Compliance: good - Checking BP at home: yes, 97-130 SBP. Normally 110s SBP - Denies any SOB, CP, vision changes, LE edema, medication SEs, or symptoms of hypotension - Exercise: walks around [employer] with walker  Hypothyroidism - Medications: Synthroid 158mcg - Current symptoms:  none - Denies change in energy level, diarrhea, nervousness, and palpitations - Symptoms have been well-controlled  CHRONIC KIDNEY DISEASE CKD status: IIIb. Thought 2/2 ischemic nephropathy 2/2 decreased cardiac output Medications renally dose: yes Previous renal evaluation: yes Pneumovax:  Up to Date Influenza Vaccine:  due, instructed to receive at pharmacy, we do not have high dose flu vaccine available.  Leg ulcers - seeing Vasc Surg, Wound Care. Arterial dopplers with moderate stenosis in R leg. Scheduled for angiogram with possible angioplasty. Has wound f/u in 2 days.   OBJECTIVE:   BP (!) 97/55 (BP Location: Left Arm, Patient Position: Sitting, Cuff Size: Large)   Pulse (!) 58   Temp 98 F (36.7 C) (Oral)   Resp 16   Ht 6\' 2"  (1.88 m)   Wt 240 lb (108.9 kg)   SpO2 96%   BMI 30.81 kg/m   Gen: well appearing, in NAD Card: RRR Lungs: CTAB Ext: Warm, no LE edema. Bandage to RLL wound.   ASSESSMENT/PLAN:   Hypertension Hypotensive but asymptomatic. Recently d/ced HCTZ, switched lasix to torsemide and with recent decrease in lisinopril. Reviewed recent labs. Continue to follow with Cardiology, nephrology.  CHF (congestive heart failure), NYHA class II, unspecified failure chronicity, combined ([place]) Euvolemic today, doing well with switch to torsemide and recent decrease of lisinopril, continue current meds. Reviewed recent labs. Continue to follow with Cardiology.      Myles Gip, DO

## 2022-02-18 NOTE — Assessment & Plan Note (Signed)
Euvolemic today, doing well with switch to torsemide and recent decrease of lisinopril, continue current meds. Reviewed recent labs. Continue to follow with Cardiology.

## 2022-02-18 NOTE — Assessment & Plan Note (Addendum)
Hypotensive but asymptomatic. Recently d/ced HCTZ, switched lasix to torsemide and with recent decrease in lisinopril. Reviewed recent labs. Continue to follow with Cardiology, nephrology.

## 2022-02-19 ENCOUNTER — Ambulatory Visit: Payer: Self-pay

## 2022-02-19 DIAGNOSIS — L97911 Non-pressure chronic ulcer of unspecified part of right lower leg limited to breakdown of skin: Secondary | ICD-10-CM | POA: Insufficient documentation

## 2022-02-19 LAB — LIPID PANEL
Chol/HDL Ratio: 3.8 ratio (ref 0.0–5.0)
Cholesterol, Total: 162 mg/dL (ref 100–199)
HDL: 43 mg/dL (ref >39)
LDL Chol Calc (NIH): 93 mg/dL (ref 0–99)
Triglycerides: 145 mg/dL (ref 0–149)
VLDL Cholesterol Cal: 26 mg/dL (ref 5–40)

## 2022-02-19 LAB — TSH: TSH: 2.79 u[IU]/mL (ref 0.450–4.500)

## 2022-02-19 NOTE — Assessment & Plan Note (Addendum)
Improving per wound note. Bandage in place. Continue to follow with wound care, upcoming appt in 2 days.

## 2022-02-19 NOTE — Assessment & Plan Note (Signed)
On ACEi. Reviewed recent labs. Continue to follow with Nephro.

## 2022-02-19 NOTE — Assessment & Plan Note (Signed)
RRR today. Continue xarelto.

## 2022-02-19 NOTE — Assessment & Plan Note (Signed)
Recheck labs 

## 2022-02-20 ENCOUNTER — Encounter: Payer: PPO | Admitting: Physician Assistant

## 2022-02-20 DIAGNOSIS — L97512 Non-pressure chronic ulcer of other part of right foot with fat layer exposed: Secondary | ICD-10-CM | POA: Diagnosis not present

## 2022-02-20 DIAGNOSIS — G9009 Other idiopathic peripheral autonomic neuropathy: Secondary | ICD-10-CM | POA: Diagnosis not present

## 2022-02-20 DIAGNOSIS — I87333 Chronic venous hypertension (idiopathic) with ulcer and inflammation of bilateral lower extremity: Secondary | ICD-10-CM | POA: Diagnosis not present

## 2022-02-20 NOTE — Progress Notes (Addendum)
SANDERS, MANNINEN ([id number]) Visit Report for 02/20/2022 Chief Complaint Document Details Patient Name: [name], [name]. Date of Service: 02/20/2022 8:15 AM Medical Record Number: [id number] Patient Account Number: [id number] Date of Birth/Sex: 1941/03/15 (81 y.o. M) Treating RN: Carlene Coria Primary Care Provider: [name] Other Clinician: Massie Kluver Referring Provider: [name] Treating Provider/Extender: Skipper Cliche in Treatment: 5 Information Obtained from: Patient Chief Complaint Multiple LE Ulcers Electronic Signature(s) Signed: 02/20/2022 8:58:36 AM By: Worthy Keeler PA-C Entered By: Worthy Keeler on 02/20/2022 08:58:36 [name], [name] ([id number]) -------------------------------------------------------------------------------- Debridement Details Patient Name: [name] Ewing D. Date of Service: 02/20/2022 8:15 AM Medical Record Number: [id number] Patient Account Number: [id number] Date of Birth/Sex: 07/24/1940 (81 y.o. M) Treating RN: Carlene Coria Primary Care Provider: [name] Other Clinician: Massie Kluver Referring Provider: [name] Treating Provider/Extender: Skipper Cliche in Treatment: 5 Debridement Performed for Wound #2 Right,Plantar Foot Assessment: Performed By: Physician Tommie Sams., PA-C Debridement Type: Debridement Level of Consciousness (Pre- Awake and Alert procedure): Pre-procedure Verification/Time Out Yes - 09:07 Taken: Start Time: 09:07 Total Area Debrided (L x W): 1.2 (cm) x 0.7 (cm) = 0.84 (cm) Tissue and other material Viable, Non-Viable, Callus, Slough, Subcutaneous, Slough debrided: Level: Skin/Subcutaneous Tissue Debridement Description: Excisional Instrument: Curette Bleeding: Minimum Hemostasis Achieved: Pressure End Time: 09:11 Response to Treatment: Procedure was tolerated well Level of Consciousness (Post- Awake and Alert procedure): Post Debridement Measurements of Total  Wound Length: (cm) 1.2 Width: (cm) 0.7 Depth: (cm) 0.1 Volume: (cm) 0.066 Character of Wound/Ulcer Post Debridement: Stable Post Procedure Diagnosis Same as Pre-procedure Electronic Signature(s) Signed: 02/20/2022 4:19:23 PM By: Carlene Coria RN Signed: 02/20/2022 4:37:38 PM By: Massie Kluver Signed: 02/20/2022 5:20:28 PM By: Worthy Keeler PA-C Entered By: Massie Kluver on 02/20/2022 09:12:40 [name], [name] ([id number]) -------------------------------------------------------------------------------- HPI Details Patient Name: [name] Ewing D. Date of Service: 02/20/2022 8:15 AM Medical Record Number: [id number] Patient Account Number: [id number] Date of Birth/Sex: 14-Jan-1941 (81 y.o. M) Treating RN: Carlene Coria Primary Care Provider: [name] Other Clinician: Massie Kluver Referring Provider: [name] Treating Provider/Extender: Skipper Cliche in Treatment: 5 History of Present Illness HPI Description: 01-16-2022 upon evaluation today patient presents for initial inspection here in the clinic concerning issues that he has been having with wounds over the bilateral lower extremities and bilateral feet. Fortunately there does not appear to be any signs of significant infection at this point which is great news. Unfortunately his ABIs were registering at 0.49 on the left and 0.57 on the right here in the clinic on the screening today. With that being said I do think that it may possibly be true that we need to get him to formal arterial studies in order to see how things really are showing up. And the patient's not in disagreement with this for that reason we are going to make that referral as well. With that being said he does have some significant past medical history items of note detailed below. Patient does have chronic kidney disease stage IIIb, coronary artery disease, long-term use of anticoagulant therapy, dilated cardiomyopathy. Proximal atrial fibrillation, he  is on Xarelto long-term. He also has congestive heart failure, hypertension, peripheral neuropathy not related to diabetes as he is only prediabetic with a hemoglobin A1c of 5.8, and chronic venous hypertension. He has recently seen Dr. [name] show his cardiologist who did discontinue the hydrochlorothiazide and the Lasix and felt that the patient was retaining more fluid than he should be. For that reason he  was started on furosemide 20 mg twice daily. 7/27; patient with bilateral lower extremity wounds. These are largely venous although he had very poor ABIs on screening test here. He has an appointment apparently tomorrow at vein and vascular for arterial studies. He was not too keen on the latter although I think I emphasized the need to do this both in terms of dealing with the current wounds and being prepared for further issues down the road. We have been using silver alginate on the wounds and Tubigrip's edema control is better per our intake nurse 02-06-2022 upon evaluation today patient appears to be doing well with regard to his wound. Fortunately there is no signs of infection he is actually healing for the most part on the bilateral feet. His legs are also doing much better though he has 1 new area that popped up on the right leg. Fortunately there is no evidence of active infection of note his ABIs and TBI's were poor on the vascular screening on the 31st with the left being worse than the right. For that reason I am going to recommend he still continue to follow-up with vascular to discuss angioplasty/stenting. [id number] upon evaluation today patient appears to be doing well currently in regard to his wound on the leg although he has several other blisters that are showing up at this point. I do believe that we need to try to do something to get his swelling under control this could be of utmost importance as far as getting this healed is concerned. Electronic Signature(s) Signed:  02/20/2022 5:10:29 PM By: Worthy Keeler PA-C Entered By: Worthy Keeler on 02/20/2022 17:10:29 [name], [name] ([id number]) -------------------------------------------------------------------------------- Physical Exam Details Patient Name: [name] Ewing D. Date of Service: 02/20/2022 8:15 AM Medical Record Number: [id number] Patient Account Number: [id number] Date of Birth/Sex: 12/15/1940 (81 y.o. M) Treating RN: Carlene Coria Primary Care Provider: [name] Other Clinician: Massie Kluver Referring Provider: [name] Treating Provider/Extender: [name] Weeks in Treatment: 5 Constitutional Well-nourished and well-hydrated in no acute distress. Respiratory normal breathing without difficulty. Psychiatric this patient is able to make decisions and demonstrates good insight into disease process. Alert and Oriented x 3. pleasant and cooperative. Notes Upon inspection patient's wound bed actually showed signs of good granulation and epithelization at this point. Fortunately there does not appear to be any evidence of active infection locally or systemically which is great news and overall I am extremely pleased with where we stand currently. Electronic Signature(s) Signed: 02/20/2022 5:10:44 PM By: Worthy Keeler PA-C Entered By: Worthy Keeler on 02/20/2022 17:10:44 [name], [name] ([id number]) -------------------------------------------------------------------------------- Physician Orders Details Patient Name: [name] Ewing D. Date of Service: 02/20/2022 8:15 AM Medical Record Number: [id number] Patient Account Number: [id number] Date of Birth/Sex: December 25, 1940 (81 y.o. M) Treating RN: Carlene Coria Primary Care Provider: [name] Other Clinician: Massie Kluver Referring Provider: [name] Treating Provider/Extender: Skipper Cliche in Treatment: 5 Verbal / Phone Orders: No Diagnosis Coding ICD-10 Coding Code Description [phone] Chronic venous  hypertension (idiopathic) with ulcer and inflammation of bilateral lower extremity L97.812 Non-pressure chronic ulcer of other part of right lower leg with fat layer exposed L97.822 Non-pressure chronic ulcer of other part of left lower leg with fat layer exposed G90.09 Other idiopathic peripheral autonomic neuropathy L97.522 Non-pressure chronic ulcer of other part of left foot with fat layer exposed L97.512 Non-pressure chronic ulcer of other part of right foot with fat layer exposed I10 Essential (primary) hypertension I50.42 Chronic combined systolic (  congestive) and diastolic (congestive) heart failure I48.0 Paroxysmal atrial fibrillation I42.0 Dilated cardiomyopathy Z79.01 Long term (current) use of anticoagulants I25.10 Atherosclerotic heart disease of native coronary artery without angina pectoris N18.32 Chronic kidney disease, stage 3b Follow-up Appointments o Return Appointment in 1 week. o Nurse Visit as needed Bathing/ Shower/ Hygiene o May shower with wound dressing protected with water repellent cover or cast protector. Anesthetic (Use 'Patient Medications' Section for Anesthetic Order Entry) o Lidocaine applied to wound bed Edema Control - Lymphedema / Segmental Compressive Device / Other o Tubigrip double layer applied - tubi E double layer o Elevate, Exercise Daily and Avoid Standing for Long Periods of Time. o Elevate legs to the level of the heart and pump ankles as often as possible o Elevate leg(s) parallel to the floor when sitting. Wound Treatment Wound #2 - Foot Wound Laterality: Plantar, Right Cleanser: Wound Cleanser 1 x Per Week/30 Days Discharge Instructions: Wash your hands with soap and water. Remove old dressing, discard into plastic bag and place into trash. Cleanse the wound with Wound Cleanser prior to applying a clean dressing using gauze sponges, not tissues or cotton balls. Do not scrub or use excessive force. Pat dry using gauze  sponges, not tissue or cotton balls. Primary Dressing: Silvercel Small 2x2 (in/in) 1 x Per Week/30 Days Discharge Instructions: Apply Silvercel Small 2x2 (in/in) as instructed Secondary Dressing: Zetuvit Plus 4x8 (in/in) 1 x Per Week/30 Days Secured With: Medipore Tape - 18M Medipore H Soft Cloth Surgical Tape, 2x2 (in/yd) 1 x Per Week/30 Days Secured With: Conform 4'' - Conforming Stretch Gauze Bandage 4x75 (in/in) 1 x Per Week/30 Days Discharge Instructions: Apply as directed Secured With: Tubigrip Size E, 3.5x10 (in/yds) 1 x Per Week/30 Days Discharge Instructions: Apply 3 Tubigrip E 3-finger-widths below knee to base of toes to secure dressing and/or for swelling. [name], [name] ([id number]) Wound #6 - Lower Leg Wound Laterality: Right, Medial Cleanser: Wound Cleanser 1 x Per Week/30 Days Discharge Instructions: Wash your hands with soap and water. Remove old dressing, discard into plastic bag and place into trash. Cleanse the wound with Wound Cleanser prior to applying a clean dressing using gauze sponges, not tissues or cotton balls. Do not scrub or use excessive force. Pat dry using gauze sponges, not tissue or cotton balls. Primary Dressing: Silvercel Small 2x2 (in/in) 1 x Per Week/30 Days Discharge Instructions: Apply Silvercel Small 2x2 (in/in) as instructed Secondary Dressing: Zetuvit Plus 4x8 (in/in) 1 x Per Week/30 Days Secured With: Medipore Tape - 18M Medipore H Soft Cloth Surgical Tape, 2x2 (in/yd) 1 x Per Week/30 Days Secured With: Conform 4'' - Conforming Stretch Gauze Bandage 4x75 (in/in) 1 x Per Week/30 Days Discharge Instructions: Apply as directed Secured With: Tubigrip Size E, 3.5x10 (in/yds) 1 x Per Week/30 Days Discharge Instructions: Apply 3 Tubigrip E 3-finger-widths below knee to base of toes to secure dressing and/or for swelling. Wound #7 - Lower Leg Wound Laterality: Right, Lateral, Proximal Cleanser: Wound Cleanser 1 x Per Week/30 Days Discharge Instructions:  Wash your hands with soap and water. Remove old dressing, discard into plastic bag and place into trash. Cleanse the wound with Wound Cleanser prior to applying a clean dressing using gauze sponges, not tissues or cotton balls. Do not scrub or use excessive force. Pat dry using gauze sponges, not tissue or cotton balls. Primary Dressing: Silvercel Small 2x2 (in/in) 1 x Per Week/30 Days Discharge Instructions: Apply Silvercel Small 2x2 (in/in) as instructed Secondary Dressing: Zetuvit Plus 4x8 (in/in) 1  x Per Week/30 Days Secured With: Medipore Tape - 67M Medipore H Soft Cloth Surgical Tape, 2x2 (in/yd) 1 x Per Week/30 Days Secured With: Conform 4'' - Conforming Stretch Gauze Bandage 4x75 (in/in) 1 x Per Week/30 Days Discharge Instructions: Apply as directed Secured With: Tubigrip Size E, 3.5x10 (in/yds) 1 x Per Week/30 Days Discharge Instructions: Apply 3 Tubigrip E 3-finger-widths below knee to base of toes to secure dressing and/or for swelling. Wound #8 - Lower Leg Wound Laterality: Right, Lateral, Distal Cleanser: Wound Cleanser 1 x Per Week/30 Days Discharge Instructions: Wash your hands with soap and water. Remove old dressing, discard into plastic bag and place into trash. Cleanse the wound with Wound Cleanser prior to applying a clean dressing using gauze sponges, not tissues or cotton balls. Do not scrub or use excessive force. Pat dry using gauze sponges, not tissue or cotton balls. Primary Dressing: Silvercel Small 2x2 (in/in) 1 x Per Week/30 Days Discharge Instructions: Apply Silvercel Small 2x2 (in/in) as instructed Secondary Dressing: Zetuvit Plus 4x8 (in/in) 1 x Per Week/30 Days Secured With: Medipore Tape - 67M Medipore H Soft Cloth Surgical Tape, 2x2 (in/yd) 1 x Per Week/30 Days Secured With: Conform 4'' - Conforming Stretch Gauze Bandage 4x75 (in/in) 1 x Per Week/30 Days Discharge Instructions: Apply as directed Secured With: Tubigrip Size E, 3.5x10 (in/yds) 1 x Per Week/30  Days Discharge Instructions: Apply 3 Tubigrip E 3-finger-widths below knee to base of toes to secure dressing and/or for swelling. Electronic Signature(s) Signed: 02/20/2022 4:37:38 PM By: Massie Kluver Signed: 02/20/2022 5:20:28 PM By: Worthy Keeler PA-C Entered By: Massie Kluver on 02/20/2022 09:15:18 Blatt, [place] D. ([id number]) -------------------------------------------------------------------------------- Problem List Details Patient Name: [name], [name] Date of Service: 02/20/2022 8:15 AM Medical Record Number: [id number] Patient Account Number: [id number] Date of Birth/Sex: 12/09/1940 (81 y.o. M) Treating RN: Carlene Coria Primary Care Provider: [name] Other Clinician: Massie Kluver Referring Provider: [name] Treating Provider/Extender: Skipper Cliche in Treatment: 5 Active Problems ICD-10 Encounter Code Description Active Date MDM Diagnosis I87.333 Chronic venous hypertension (idiopathic) with ulcer and inflammation of 01/16/2022 No Yes bilateral lower extremity L97.812 Non-pressure chronic ulcer of other part of right lower leg with fat layer 01/16/2022 No Yes exposed L97.822 Non-pressure chronic ulcer of other part of left lower leg with fat layer 01/16/2022 No Yes exposed G90.09 Other idiopathic peripheral autonomic neuropathy 01/16/2022 No Yes L97.522 Non-pressure chronic ulcer of other part of left foot with fat layer 01/16/2022 No Yes exposed L97.512 Non-pressure chronic ulcer of other part of right foot with fat layer 01/16/2022 No Yes exposed [place] (primary) hypertension 01/16/2022 No Yes I50.42 Chronic combined systolic (congestive) and diastolic (congestive) heart 01/16/2022 No Yes failure I48.0 Paroxysmal atrial fibrillation 01/16/2022 No Yes I42.0 Dilated cardiomyopathy 01/16/2022 No Yes Z79.01 Long term (current) use of anticoagulants 01/16/2022 No Yes I25.10 Atherosclerotic heart disease of native coronary artery without angina  01/16/2022 No Yes pectoris [name], [name] ([id number]) N18.32 Chronic kidney disease, stage 3b 01/16/2022 No Yes Inactive Problems Resolved Problems Electronic Signature(s) Signed: 02/20/2022 8:58:18 AM By: Worthy Keeler PA-C Entered By: Worthy Keeler on 02/20/2022 08:58:18 [name], [name] D. ([id number]) -------------------------------------------------------------------------------- Progress Note Details Patient Name: [name] Ewing D. Date of Service: 02/20/2022 8:15 AM Medical Record Number: [id number] Patient Account Number: [id number] Date of Birth/Sex: 06-Jul-1940 (81 y.o. M) Treating RN: Carlene Coria Primary Care Provider: [name] Other Clinician: Massie Kluver Referring Provider: [name] Treating Provider/Extender: Skipper Cliche in Treatment: 5 Subjective Chief Complaint Information obtained  from Patient Multiple LE Ulcers History of Present Illness (HPI) 01-16-2022 upon evaluation today patient presents for initial inspection here in the clinic concerning issues that he has been having with wounds over the bilateral lower extremities and bilateral feet. Fortunately there does not appear to be any signs of significant infection at this point which is great news. Unfortunately his ABIs were registering at 0.49 on the left and 0.57 on the right here in the clinic on the screening today. With that being said I do think that it may possibly be true that we need to get him to formal arterial studies in order to see how things really are showing up. And the patient's not in disagreement with this for that reason we are going to make that referral as well. With that being said he does have some significant past medical history items of note detailed below. Patient does have chronic kidney disease stage IIIb, coronary artery disease, long-term use of anticoagulant therapy, dilated cardiomyopathy. Proximal atrial fibrillation, he is on Xarelto long-term. He also has  congestive heart failure, hypertension, peripheral neuropathy not related to diabetes as he is only prediabetic with a hemoglobin A1c of 5.8, and chronic venous hypertension. He has recently seen Dr. [name] show his cardiologist who did discontinue the hydrochlorothiazide and the Lasix and felt that the patient was retaining more fluid than he should be. For that reason he was started on furosemide 20 mg twice daily. 7/27; patient with bilateral lower extremity wounds. These are largely venous although he had very poor ABIs on screening test here. He has an appointment apparently tomorrow at vein and vascular for arterial studies. He was not too keen on the latter although I think I emphasized the need to do this both in terms of dealing with the current wounds and being prepared for further issues down the road. We have been using silver alginate on the wounds and Tubigrip's edema control is better per our intake nurse 02-06-2022 upon evaluation today patient appears to be doing well with regard to his wound. Fortunately there is no signs of infection he is actually healing for the most part on the bilateral feet. His legs are also doing much better though he has 1 new area that popped up on the right leg. Fortunately there is no evidence of active infection of note his ABIs and TBI's were poor on the vascular screening on the 31st with the left being worse than the right. For that reason I am going to recommend he still continue to follow-up with vascular to discuss angioplasty/stenting. 8-124-23 upon evaluation today patient appears to be doing well currently in regard to his wound on the leg although he has several other blisters that are showing up at this point. I do believe that we need to try to do something to get his swelling under control this could be of utmost importance as far as getting this healed is concerned. Objective Constitutional Well-nourished and well-hydrated in no acute  distress. Vitals Time Taken: 8:25 AM, Height: 74 in, Weight: 244 lbs, BMI: 31.3, Temperature: 97.7 F, Pulse: 64 bpm, Respiratory Rate: 18 breaths/min, Blood Pressure: 126/66 mmHg. Respiratory normal breathing without difficulty. Psychiatric this patient is able to make decisions and demonstrates good insight into disease process. Alert and Oriented x 3. pleasant and cooperative. General Notes: Upon inspection patient's wound bed actually showed signs of good granulation and epithelization at this point. Fortunately there does not appear to be any evidence of active infection locally  or systemically which is great news and overall I am extremely pleased with where we stand currently. Integumentary (Hair, Skin) Wound #2 status is Open. Original cause of wound was Gradually Appeared. The date acquired was: 12/28/2020. The wound has been in treatment [name], [name] ([id number]) 5 weeks. The wound is located on the [place]. The wound measures 1.2cm length x 0.7cm width x 0.1cm depth; 0.66cm^2 area and 0.066cm^3 volume. There is Fat Layer (Subcutaneous Tissue) exposed. There is a medium amount of serosanguineous drainage noted. There is medium (34-66%) red granulation within the wound bed. There is a medium (34-66%) amount of necrotic tissue within the wound bed including Adherent Slough. Wound #5 status is Healed - Epithelialized. Original cause of wound was Shear/Friction. The date acquired was: 02/04/2022. The wound has been in treatment 2 weeks. The wound is located on the Right,Anterior Lower Leg. The wound measures 0cm length x 0cm width x 0cm depth; 0cm^2 area and 0cm^3 volume. There is no tunneling or undermining noted. There is a none present amount of drainage noted. There is no granulation within the wound bed. There is no necrotic tissue within the wound bed. Wound #6 status is Open. Original cause of wound was Shear/Friction. The date acquired was: 02/20/2022. The wound is  located on the Right,Medial Lower Leg. The wound measures 0.9cm length x 0.6cm width x 0.1cm depth; 0.424cm^2 area and 0.042cm^3 volume. There is no tunneling or undermining noted. There is a medium amount of serosanguineous drainage noted. The wound margin is flat and intact. There is small (1-33%) red, pink granulation within the wound bed. There is no necrotic tissue within the wound bed. Wound #7 status is Open. Original cause of wound was Shear/Friction. The date acquired was: 02/13/2022. The wound is located on the Right,Proximal,Lateral Lower Leg. The wound measures 3.1cm length x 3.3cm width x 0.1cm depth; 8.035cm^2 area and 0.803cm^3 volume. There is Fat Layer (Subcutaneous Tissue) exposed. There is no tunneling or undermining noted. There is a medium amount of serosanguineous drainage noted. The wound margin is flat and intact. There is small (1-33%) red, pink granulation within the wound bed. There is no necrotic tissue within the wound bed. Wound #8 status is Open. Original cause of wound was Shear/Friction. The date acquired was: 02/13/2022. The wound is located on the Right,Distal,Lateral Lower Leg. The wound measures 4.5cm length x 2.9cm width x 0.1cm depth; 10.249cm^2 area and 1.025cm^3 volume. There is Fat Layer (Subcutaneous Tissue) exposed. There is no tunneling or undermining noted. There is a medium amount of serosanguineous drainage noted. The wound margin is flat and intact. There is small (1-33%) red granulation within the wound bed. There is no necrotic tissue within the wound bed. Assessment Active Problems ICD-10 Chronic venous hypertension (idiopathic) with ulcer and inflammation of bilateral lower extremity Non-pressure chronic ulcer of other part of right lower leg with fat layer exposed Non-pressure chronic ulcer of other part of left lower leg with fat layer exposed Other idiopathic peripheral autonomic neuropathy Non-pressure chronic ulcer of other part of left  foot with fat layer exposed Non-pressure chronic ulcer of other part of right foot with fat layer exposed Essential (primary) hypertension Chronic combined systolic (congestive) and diastolic (congestive) heart failure Paroxysmal atrial fibrillation Dilated cardiomyopathy Long term (current) use of anticoagulants Atherosclerotic heart disease of native coronary artery without angina pectoris Chronic kidney disease, stage 3b Procedures Wound #2 Pre-procedure diagnosis of Wound #2 is a Neuropathic Ulcer-Non Diabetic located on the [place] . There was  a Excisional Skin/Subcutaneous Tissue Debridement with a total area of 0.84 sq cm performed by Tommie Sams., PA-C. With the following instrument(s): Curette to remove Viable and Non-Viable tissue/material. Material removed includes Callus, Subcutaneous Tissue, and Slough. A time out was conducted at 09:07, prior to the start of the procedure. A Minimum amount of bleeding was controlled with Pressure. The procedure was tolerated well. Post Debridement Measurements: 1.2cm length x 0.7cm width x 0.1cm depth; 0.066cm^3 volume. Character of Wound/Ulcer Post Debridement is stable. Post procedure Diagnosis Wound #2: Same as Pre-Procedure Plan Follow-up Appointments: Return Appointment in 1 week. Nurse Visit as needed [name], [name] ([id number]) Bathing/ Shower/ Hygiene: May shower with wound dressing protected with water repellent cover or cast protector. Anesthetic (Use 'Patient Medications' Section for Anesthetic Order Entry): Lidocaine applied to wound bed Edema Control - Lymphedema / Segmental Compressive Device / Other: Tubigrip double layer applied - tubi E double layer Elevate, Exercise Daily and Avoid Standing for Long Periods of Time. Elevate legs to the level of the heart and pump ankles as often as possible Elevate leg(s) parallel to the floor when sitting. WOUND #2: - Foot Wound Laterality: Plantar, Right Cleanser:  Wound Cleanser 1 x Per Week/30 Days Discharge Instructions: Wash your hands with soap and water. Remove old dressing, discard into plastic bag and place into trash. Cleanse the wound with Wound Cleanser prior to applying a clean dressing using gauze sponges, not tissues or cotton balls. Do not scrub or use excessive force. Pat dry using gauze sponges, not tissue or cotton balls. Primary Dressing: Silvercel Small 2x2 (in/in) 1 x Per Week/30 Days Discharge Instructions: Apply Silvercel Small 2x2 (in/in) as instructed Secondary Dressing: Zetuvit Plus 4x8 (in/in) 1 x Per Week/30 Days Secured With: Medipore Tape - 52M Medipore H Soft Cloth Surgical Tape, 2x2 (in/yd) 1 x Per Week/30 Days Secured With: Conform 4'' - Conforming Stretch Gauze Bandage 4x75 (in/in) 1 x Per Week/30 Days Discharge Instructions: Apply as directed Secured With: Tubigrip Size E, 3.5x10 (in/yds) 1 x Per Week/30 Days Discharge Instructions: Apply 3 Tubigrip E 3-finger-widths below knee to base of toes to secure dressing and/or for swelling. WOUND #6: - Lower Leg Wound Laterality: Right, Medial Cleanser: Wound Cleanser 1 x Per Week/30 Days Discharge Instructions: Wash your hands with soap and water. Remove old dressing, discard into plastic bag and place into trash. Cleanse the wound with Wound Cleanser prior to applying a clean dressing using gauze sponges, not tissues or cotton balls. Do not scrub or use excessive force. Pat dry using gauze sponges, not tissue or cotton balls. Primary Dressing: Silvercel Small 2x2 (in/in) 1 x Per Week/30 Days Discharge Instructions: Apply Silvercel Small 2x2 (in/in) as instructed Secondary Dressing: Zetuvit Plus 4x8 (in/in) 1 x Per Week/30 Days Secured With: Medipore Tape - 52M Medipore H Soft Cloth Surgical Tape, 2x2 (in/yd) 1 x Per Week/30 Days Secured With: Conform 4'' - Conforming Stretch Gauze Bandage 4x75 (in/in) 1 x Per Week/30 Days Discharge Instructions: Apply as directed Secured With:  Tubigrip Size E, 3.5x10 (in/yds) 1 x Per Week/30 Days Discharge Instructions: Apply 3 Tubigrip E 3-finger-widths below knee to base of toes to secure dressing and/or for swelling. WOUND #7: - Lower Leg Wound Laterality: Right, Lateral, Proximal Cleanser: Wound Cleanser 1 x Per Week/30 Days Discharge Instructions: Wash your hands with soap and water. Remove old dressing, discard into plastic bag and place into trash. Cleanse the wound with Wound Cleanser prior to applying a clean dressing using gauze sponges, not  tissues or cotton balls. Do not scrub or use excessive force. Pat dry using gauze sponges, not tissue or cotton balls. Primary Dressing: Silvercel Small 2x2 (in/in) 1 x Per Week/30 Days Discharge Instructions: Apply Silvercel Small 2x2 (in/in) as instructed Secondary Dressing: Zetuvit Plus 4x8 (in/in) 1 x Per Week/30 Days Secured With: Medipore Tape - 61M Medipore H Soft Cloth Surgical Tape, 2x2 (in/yd) 1 x Per Week/30 Days Secured With: Conform 4'' - Conforming Stretch Gauze Bandage 4x75 (in/in) 1 x Per Week/30 Days Discharge Instructions: Apply as directed Secured With: Tubigrip Size E, 3.5x10 (in/yds) 1 x Per Week/30 Days Discharge Instructions: Apply 3 Tubigrip E 3-finger-widths below knee to base of toes to secure dressing and/or for swelling. WOUND #8: - Lower Leg Wound Laterality: Right, Lateral, Distal Cleanser: Wound Cleanser 1 x Per Week/30 Days Discharge Instructions: Wash your hands with soap and water. Remove old dressing, discard into plastic bag and place into trash. Cleanse the wound with Wound Cleanser prior to applying a clean dressing using gauze sponges, not tissues or cotton balls. Do not scrub or use excessive force. Pat dry using gauze sponges, not tissue or cotton balls. Primary Dressing: Silvercel Small 2x2 (in/in) 1 x Per Week/30 Days Discharge Instructions: Apply Silvercel Small 2x2 (in/in) as instructed Secondary Dressing: Zetuvit Plus 4x8 (in/in) 1 x Per  Week/30 Days Secured With: Medipore Tape - 61M Medipore H Soft Cloth Surgical Tape, 2x2 (in/yd) 1 x Per Week/30 Days Secured With: Conform 4'' - Conforming Stretch Gauze Bandage 4x75 (in/in) 1 x Per Week/30 Days Discharge Instructions: Apply as directed Secured With: Tubigrip Size E, 3.5x10 (in/yds) 1 x Per Week/30 Days Discharge Instructions: Apply 3 Tubigrip E 3-finger-widths below knee to base of toes to secure dressing and/or for swelling. 1. Based on what I am seeing currently I do think that we can go ahead and continue with the wound care measures as before and the patient is in agreement with plan. This includes the use of the silver alginate dressing to all locations which I think is good to be ideal. 2. I am also can recommend that we have the patient continue rather initiate treatment with Tubigrip size E I cannot do too much compression as we are still waiting on the referral to vascular and intervention there but nonetheless we need some kind of compression in the interim. We will see patient back for reevaluation in 1 week here in the clinic. If anything worsens or changes patient will contact our office for additional recommendations. Electronic Signature(s) Signed: 02/20/2022 5:12:26 PM By: Worthy Keeler PA-C Entered By: Worthy Keeler on 02/20/2022 17:12:26 GARLAND, HINCAPIE ([id number]) Bilton, [name] ([id number]) -------------------------------------------------------------------------------- SuperBill Details Patient Name: [name] Ewing D. Date of Service: 02/20/2022 Medical Record Number: [id number] Patient Account Number: [id number] Date of Birth/Sex: 02/18/1941 (81 y.o. M) Treating RN: Carlene Coria Primary Care Provider: [name] Other Clinician: Massie Kluver Referring Provider: [name] Treating Provider/Extender: Skipper Cliche in Treatment: 5 Diagnosis Coding ICD-10 Codes Code Description [phone] Chronic venous hypertension (idiopathic) with  ulcer and inflammation of bilateral lower extremity L97.812 Non-pressure chronic ulcer of other part of right lower leg with fat layer exposed L97.822 Non-pressure chronic ulcer of other part of left lower leg with fat layer exposed G90.09 Other idiopathic peripheral autonomic neuropathy L97.522 Non-pressure chronic ulcer of other part of left foot with fat layer exposed L97.512 Non-pressure chronic ulcer of other part of right foot with fat layer exposed I10 Essential (primary) hypertension I50.42 Chronic combined  systolic (congestive) and diastolic (congestive) heart failure I48.0 Paroxysmal atrial fibrillation I42.0 Dilated cardiomyopathy Z79.01 Long term (current) use of anticoagulants I25.10 Atherosclerotic heart disease of native coronary artery without angina pectoris N18.32 Chronic kidney disease, stage 3b Facility Procedures CPT4 Code: 86168372 Description: 90211 - DEB SUBQ TISSUE 20 SQ CM/< Modifier: Quantity: 1 CPT4 Code: Description: ICD-10 Diagnosis Description L97.512 Non-pressure chronic ulcer of other part of right foot with fat layer ex Modifier: posed Quantity: Physician Procedures CPT4 Code: 1552080 Description: 11042 - WC PHYS SUBQ TISS 20 SQ CM Modifier: Quantity: 1 CPT4 Code: Description: ICD-10 Diagnosis Description L97.512 Non-pressure chronic ulcer of other part of right foot with fat layer ex Modifier: posed Quantity: Electronic Signature(s) Signed: 02/20/2022 5:12:51 PM By: Worthy Keeler PA-C Entered By: Worthy Keeler on 02/20/2022 17:12:50

## 2022-02-20 NOTE — Progress Notes (Signed)
SADIK, [name] ([id number]) Visit Report for 02/20/2022 Arrival Information Details Patient Name: [name], [name]. Date of Service: 02/20/2022 8:15 AM Medical Record Number: [id number] Patient Account Number: [id number] Date of Birth/Sex: Aug 22, 1940 (81 y.o. M) Treating RN: Carlene Coria Primary Care Wendelin Bradt: [name] Other Clinician: Massie Kluver Referring Arshia Spellman: [name] Treating Alayja Armas/Extender: Skipper Cliche in Treatment: 5 Visit Information History Since Last Visit All ordered tests and consults were completed: No Patient Arrived: [name] Added or deleted any medications: No Arrival Time: 08:23 Any new allergies or adverse reactions: No Transfer Assistance: None Had a fall or experienced change in No Patient Requires Transmission-Based Precautions: No activities of daily living that may affect Patient Has Alerts: No risk of falls: Hospitalized since last visit: No Pain Present Now: Yes Electronic Signature(s) Signed: 02/20/2022 4:37:38 PM By: Massie Kluver Entered By: Massie Kluver on 02/20/2022 08:24:21 Heaps, Nathian D. ([id number]) -------------------------------------------------------------------------------- Clinic Level of Care Assessment Details Patient Name: [name] Ewing D. Date of Service: 02/20/2022 8:15 AM Medical Record Number: [id number] Patient Account Number: [id number] Date of Birth/Sex: Sep 02, 1940 (81 y.o. M) Treating RN: Carlene Coria Primary Care Vincent Streater: [name] Other Clinician: Massie Kluver Referring Camry Robello: [name] Treating Ricci Paff/Extender: Skipper Cliche in Treatment: 5 Clinic Level of Care Assessment Items TOOL 1 Quantity Score []  - Use when EandM and Procedure is performed on INITIAL visit 0 ASSESSMENTS - Nursing Assessment / Reassessment []  - General Physical Exam (combine w/ comprehensive assessment (listed just below) when performed on new 0 pt. evals) []  - 0 Comprehensive Assessment (HX, ROS,  Risk Assessments, Wounds Hx, etc.) ASSESSMENTS - Wound and Skin Assessment / Reassessment []  - Dermatologic / Skin Assessment (not related to wound area) 0 ASSESSMENTS - Ostomy and/or Continence Assessment and Care []  - Incontinence Assessment and Management 0 []  - 0 Ostomy Care Assessment and Management (repouching, etc.) PROCESS - Coordination of Care []  - Simple Patient / Family Education for ongoing care 0 []  - 0 Complex (extensive) Patient / Family Education for ongoing care []  - 0 Staff obtains Programmer, systems, Records, Test Results / Process Orders []  - 0 Staff telephones HHA, Nursing Homes / Clarify orders / etc []  - 0 Routine Transfer to another Facility (non-emergent condition) []  - 0 Routine Hospital Admission (non-emergent condition) []  - 0 New Admissions / Biomedical engineer / Ordering NPWT, Apligraf, etc. []  - 0 Emergency Hospital Admission (emergent condition) PROCESS - Special Needs []  - Pediatric / Minor Patient Management 0 []  - 0 Isolation Patient Management []  - 0 Hearing / Language / Visual special needs []  - 0 Assessment of Community assistance (transportation, D/C planning, etc.) []  - 0 Additional assistance / Altered mentation []  - 0 Support Surface(s) Assessment (bed, cushion, seat, etc.) INTERVENTIONS - Miscellaneous []  - External ear exam 0 []  - 0 Patient Transfer (multiple staff / Civil Service fast streamer / Similar devices) []  - 0 Simple Staple / Suture removal (25 or less) []  - 0 Complex Staple / Suture removal (26 or more) []  - 0 Hypo/Hyperglycemic Management (do not check if billed separately) []  - 0 Ankle / Brachial Index (ABI) - do not check if billed separately Has the patient been seen at the hospital within the last three years: Yes Total Score: 0 Level Of Care: ____ [name] ([id number]) Electronic Signature(s) Signed: 02/20/2022 4:37:38 PM By: Massie Kluver Entered By: Massie Kluver on 02/20/2022 09:15:23 Mcshea, Khari D.  ([id number]) -------------------------------------------------------------------------------- Encounter Discharge Information Details Patient Name: [name] Ewing D. Date of Service: 02/20/2022 8:15 AM Medical  Record Number: [id number] Patient Account Number: [id number] Date of Birth/Sex: 1940/11/14 (81 y.o. M) Treating RN: Carlene Coria Primary Care Maeghan Canny: [name] Other Clinician: Massie Kluver Referring [name]: [name] Treating [name]/Extender: Skipper Cliche in Treatment: 5 Encounter Discharge Information Items Post Procedure Vitals Discharge Condition: Stable Temperature (F): 97.7 Ambulatory Status: Walker Pulse (bpm): 64 Discharge Destination: Home Respiratory Rate (breaths/min): 18 Transportation: Private Auto Blood Pressure (mmHg): 126/66 Accompanied By: self Schedule Follow-up Appointment: Yes Clinical Summary of Care: Electronic Signature(s) Signed: 02/20/2022 4:37:38 PM By: Massie Kluver Entered By: Massie Kluver on 02/20/2022 12:55:43 Pardi, Jareth D. ([id number]) -------------------------------------------------------------------------------- Lower Extremity Assessment Details Patient Name: [name] Ewing D. Date of Service: 02/20/2022 8:15 AM Medical Record Number: [id number] Patient Account Number: [id number] Date of Birth/Sex: [birth year] (81 y.o. M) Treating RN: Carlene Coria Primary Care Ford Peddie: [name] Other Clinician: Massie Kluver Referring Aowyn Rozeboom: [name] Treating [name]/Extender: [name] Weeks in Treatment: 5 Edema Assessment Assessed: [name]: Yes] [Right: Yes] Edema: [Left: Yes] [Right: Yes] Calf Left: Right: Point of Measurement: 38 cm From Medial Instep 38.9 cm 41.9 cm Ankle Left: Right: Point of Measurement: 15 cm From Medial Instep 27.8 cm 30.3 cm Vascular Assessment Pulses: Dorsalis Pedis Palpable: [Left:Yes] [Right:Yes] Posterior Tibial Palpable: [Left:Yes] [Right:Yes] Electronic  Signature(s) Signed: 02/20/2022 4:19:23 PM By: Carlene Coria RN Signed: 02/20/2022 4:37:38 PM By: Massie Kluver Entered By: Massie Kluver on 02/20/2022 08:52:59 Hilgeman, Detrick D. ([id number]) -------------------------------------------------------------------------------- Multi Wound Chart Details Patient Name: [name] Ewing D. Date of Service: 02/20/2022 8:15 AM Medical Record Number: [id number] Patient Account Number: [id number] Date of Birth/Sex: [birth year] (81 y.o. M) Treating RN: Carlene Coria Primary Care Sayde Lish: [name] Other Clinician: Massie Kluver Referring Heylee Tant: [name] Treating Rhyse Loux/Extender: [name] Weeks in Treatment: 5 Vital Signs Height(in): 74 Pulse(bpm): 64 Weight(lbs): 244 Blood Pressure(mmHg): 126/66 Body Mass Index(BMI): 31.3 Temperature(F): 97.7 Respiratory Rate(breaths/min): 18 Photos: Wound Location: Right, Plantar Foot Right, Anterior Lower Leg Right, Medial Lower Leg Wounding Event: Gradually Appeared Shear/Friction Shear/Friction Primary Etiology: Neuropathic Ulcer-Non Diabetic Skin Tear Venous Leg Ulcer Comorbid History: Congestive Heart Failure, Congestive Heart Failure, Congestive Heart Failure, Hypertension Hypertension Hypertension Date Acquired: 12/28/2020 02/04/2022 02/20/2022 Weeks of Treatment: 5 2 0 Wound Status: Open Open Open Wound Recurrence: No No No Measurements L x W x D (cm) 1.2x0.7x0.1 0.1x0.1x0.1 0.9x0.6x0.1 Area (cm) : 0.66 0.008 0.424 Volume (cm) : 0.066 0.001 0.042 % Reduction in Area: -735.40% 99.70% N/A % Reduction in Volume: -175.00% 99.60% N/A Classification: Full Thickness Without Exposed Full Thickness Without Exposed Full Thickness Without Exposed Support Structures Support Structures Support Structures Exudate Amount: Medium None Present Medium Exudate Type: Serosanguineous N/A Serosanguineous Exudate Color: red, brown N/A red, brown Wound Margin: N/A N/A Flat and Intact Granulation Amount:  Medium (34-66%) None Present (0%) Small (1-33%) Granulation Quality: Red N/A Red, Pink Necrotic Amount: Medium (34-66%) None Present (0%) None Present (0%) Exposed Structures: Fat Layer (Subcutaneous Tissue): Fat Layer (Subcutaneous Tissue): N/A Yes No Epithelialization: None None None Wound Number: 7 8 N/A Photos: N/A Wound Location: Right, Proximal, Lateral Lower Leg Right, Distal, Lateral Lower Leg N/A Wounding Event: Shear/Friction Shear/Friction N/A Primary Etiology: Venous Leg Ulcer Venous Leg Ulcer N/A Peeks, Twan D. ([id number]) Comorbid History: Congestive Heart Failure, Congestive Heart Failure, N/A Hypertension Hypertension Date Acquired: 02/13/2022 02/13/2022 N/A Weeks of Treatment: 0 0 N/A Wound Status: Open Open N/A Wound Recurrence: No No N/A Measurements L x W x D (cm) 3.1x3.3x0.1 4.5x2.9x0.1 N/A Area (cm) : 8.035 10.249 N/A Volume (cm) : 0.803 1.025 N/A %  Reduction in Area: 0.00% N/A N/A % Reduction in Volume: 0.00% N/A N/A Classification: Full Thickness Without Exposed Full Thickness Without Exposed N/A Support Structures Support Structures Exudate Amount: Medium Medium N/A Exudate Type: Serosanguineous Serosanguineous N/A Exudate Color: red, brown red, brown N/A Wound Margin: Flat and Intact Flat and Intact N/A Granulation Amount: Small (1-33%) Small (1-33%) N/A Granulation Quality: Red, Pink Red N/A Necrotic Amount: None Present (0%) None Present (0%) N/A Exposed Structures: Fat Layer (Subcutaneous Tissue): Fat Layer (Subcutaneous Tissue): N/A Yes Yes Fascia: No Fascia: No Tendon: No Tendon: No Muscle: No Muscle: No Joint: No Joint: No Bone: No Bone: No Epithelialization: None None N/A Treatment Notes Electronic Signature(s) Signed: 02/20/2022 4:37:38 PM By: Massie Kluver Entered By: Massie Kluver on 02/20/2022 08:53:13 Foglio, Josia D.  ([id number]) -------------------------------------------------------------------------------- Multi-Disciplinary Care Plan Details Patient Name: [name] Ewing D. Date of Service: 02/20/2022 8:15 AM Medical Record Number: [id number] Patient Account Number: [id number] Date of Birth/Sex: May 16, 1941 (81 y.o. M) Treating RN: Carlene Coria Primary Care Elber Galyean: [name] Other Clinician: Massie Kluver Referring Caryl Manas: [name] Treating Akia Desroches/Extender: Skipper Cliche in Treatment: 5 Active Inactive Wound/Skin Impairment Nursing Diagnoses: Knowledge deficit related to ulceration/compromised skin integrity Goals: Patient/caregiver will verbalize understanding of skin care regimen Date Initiated: 01/16/2022 Target Resolution Date: 02/16/2022 Goal Status: Active Ulcer/skin breakdown will have a volume reduction of 30% by week 4 Date Initiated: 01/16/2022 Target Resolution Date: 02/16/2022 Goal Status: Active Ulcer/skin breakdown will have a volume reduction of 50% by week 8 Date Initiated: 01/16/2022 Target Resolution Date: 03/19/2022 Goal Status: Active Ulcer/skin breakdown will have a volume reduction of 80% by week 12 Date Initiated: 01/16/2022 Target Resolution Date: 04/18/2022 Goal Status: Active Ulcer/skin breakdown will heal within 14 weeks Date Initiated: 01/16/2022 Target Resolution Date: 05/19/2022 Goal Status: Active Interventions: Assess patient/caregiver ability to obtain necessary supplies Assess patient/caregiver ability to perform ulcer/skin care regimen upon admission and as needed Assess ulceration(s) every visit Notes: Electronic Signature(s) Signed: 02/20/2022 4:19:23 PM By: Carlene Coria RN Signed: 02/20/2022 4:37:38 PM By: Massie Kluver Entered By: Massie Kluver on 02/20/2022 Van, Archer D. ([id number]) -------------------------------------------------------------------------------- Pain Assessment Details Patient Name: [name] Ewing  D. Date of Service: 02/20/2022 8:15 AM Medical Record Number: [id number] Patient Account Number: [id number] Date of Birth/Sex: 03-10-1941 (81 y.o. M) Treating RN: Carlene Coria Primary Care Shunda Rabadi: [name] Other Clinician: Massie Kluver Referring Havier Deeb: [name] Treating Terrah Decoster/Extender: Skipper Cliche in Treatment: 5 Active Problems Location of Pain Severity and Description of Pain Patient Has Paino Yes Site Locations Pain Location: Pain in Ulcers Duration of the Pain. Constant / Intermittento Intermittent Rate the pain. Current Pain Level: 2 Character of Pain Describe the Pain: Other: stinging Pain Management and Medication Current Pain Management: Medication: Yes Rest: Yes Electronic Signature(s) Signed: 02/20/2022 4:19:23 PM By: Carlene Coria RN Signed: 02/20/2022 4:37:38 PM By: Massie Kluver Entered By: Massie Kluver on 02/20/2022 08:27:32 Eichinger, Jimmey D. ([id number]) -------------------------------------------------------------------------------- Patient/Caregiver Education Details Patient Name: [name] Ewing D. Date of Service: 02/20/2022 8:15 AM Medical Record Number: [id number] Patient Account Number: [id number] Date of Birth/Gender: 08-26-1940 (81 y.o. M) Treating RN: Carlene Coria Primary Care Physician: [name] Other Clinician: Massie Kluver Referring Physician: [name] Treating Physician/Extender: Skipper Cliche in Treatment: 5 Education Assessment Education Provided To: Patient Education Topics Provided Wound/Skin Impairment: Handouts: Other: continue wound care as directed Electronic Signature(s) Signed: 02/20/2022 4:37:38 PM By: Massie Kluver Entered By: Massie Kluver on 02/20/2022 09:16:25 Seif, Jhaden D. ([id number]) -------------------------------------------------------------------------------- Wound Assessment Details Patient Name: [name] Ewing  D. Date of Service: 02/20/2022 8:15 AM Medical Record  Number: [id number] Patient Account Number: [id number] Date of Birth/Sex: 1941-04-01 (81 y.o. M) Treating RN: Carlene Coria Primary Care Julie Paolini: [name] Other Clinician: Massie Kluver Referring Jari Carollo: [name] Treating Courtnie Brenes/Extender: [name] Weeks in Treatment: 5 Wound Status Wound Number: 2 Primary Etiology: Neuropathic Ulcer-Non Diabetic Wound Location: Right, Plantar Foot Wound Status: Open Wounding Event: Gradually Appeared Comorbid History: Congestive Heart Failure, Hypertension Date Acquired: 12/28/2020 Weeks Of Treatment: 5 Clustered Wound: No Photos Wound Measurements Length: (cm) 1.2 Width: (cm) 0.7 Depth: (cm) 0.1 Area: (cm) 0.66 Volume: (cm) 0.066 % Reduction in Area: -735.4% % Reduction in Volume: -175% Epithelialization: None Wound Description Classification: Full Thickness Without Exposed Support Structures Exudate Amount: Medium Exudate Type: Serosanguineous Exudate Color: red, brown Foul Odor After Cleansing: No Slough/Fibrino Yes Wound Bed Granulation Amount: Medium (34-66%) Exposed Structure Granulation Quality: Red Fat Layer (Subcutaneous Tissue) Exposed: Yes Necrotic Amount: Medium (34-66%) Necrotic Quality: Adherent Slough Treatment Notes Wound #2 (Foot) Wound Laterality: Plantar, Right Cleanser Wound Cleanser Discharge Instruction: Wash your hands with soap and water. Remove old dressing, discard into plastic bag and place into trash. Cleanse the wound with Wound Cleanser prior to applying a clean dressing using gauze sponges, not tissues or cotton balls. Do not scrub or use excessive force. Pat dry using gauze sponges, not tissue or cotton balls. Peri-Wound Care Topical Tracey, Nicholai D. ([id number]) Primary Dressing Silvercel Small 2x2 (in/in) Discharge Instruction: Apply Silvercel Small 2x2 (in/in) as instructed Secondary Dressing Zetuvit Plus 4x8 (in/in) Secured With Medipore Tape - 105M Medipore H Soft Cloth Surgical  Tape, 2x2 (in/yd) Conform 4'' - Conforming Stretch Gauze Bandage 4x75 (in/in) Discharge Instruction: Apply as directed Tubigrip Size E, 3.5x10 (in/yds) Discharge Instruction: Apply 3 Tubigrip E 3-finger-widths below knee to base of toes to secure dressing and/or for swelling. Compression Wrap Compression Stockings Add-Ons Electronic Signature(s) Signed: 02/20/2022 4:19:23 PM By: Carlene Coria RN Signed: 02/20/2022 4:37:38 PM By: Massie Kluver Entered By: Massie Kluver on 02/20/2022 08:41:11 Ault, Rishit D. ([id number]) -------------------------------------------------------------------------------- Wound Assessment Details Patient Name: [name] Ewing D. Date of Service: 02/20/2022 8:15 AM Medical Record Number: [id number] Patient Account Number: [id number] Date of Birth/Sex: 1940-07-04 (81 y.o. M) Treating RN: Carlene Coria Primary Care Seena Ritacco: [name] Other Clinician: Massie Kluver Referring Malaijah Houchen: [name] Treating Lannis Lichtenwalner/Extender: [name] Weeks in Treatment: 5 Wound Status Wound Number: 5 Primary Etiology: Skin Tear Wound Location: Right, Anterior Lower Leg Wound Status: Healed - Epithelialized Wounding Event: Shear/Friction Comorbid History: Congestive Heart Failure, Hypertension Date Acquired: 02/04/2022 Weeks Of Treatment: 2 Clustered Wound: No Photos Wound Measurements Length: (cm) 0 Width: (cm) 0 Depth: (cm) 0 Area: (cm) 0 Volume: (cm) 0 % Reduction in Area: 100% % Reduction in Volume: 100% Epithelialization: None Tunneling: No Undermining: No Wound Description Classification: Full Thickness Without Exposed Support Structures Exudate Amount: None Present Foul Odor After Cleansing: No Slough/Fibrino No Wound Bed Granulation Amount: None Present (0%) Exposed Structure Necrotic Amount: None Present (0%) Fat Layer (Subcutaneous Tissue) Exposed: No Treatment Notes Wound #5 (Lower Leg) Wound Laterality: Right,  Anterior Cleanser Peri-Wound Care Topical Primary Dressing Secondary Dressing Secured With Compression Wrap JAKHARI, SPACE ([id number]) Compression Stockings Add-Ons Electronic Signature(s) Signed: 02/20/2022 4:19:23 PM By: Carlene Coria RN Signed: 02/20/2022 4:37:38 PM By: Massie Kluver Entered By: Massie Kluver on 02/20/2022 09:07:21 Pfluger, Esvin D. ([id number]) -------------------------------------------------------------------------------- Wound Assessment Details Patient Name: [name] Ewing D. Date of Service: 02/20/2022 8:15 AM Medical Record Number: [id number] Patient Account Number: [id number] Date  of Birth/Sex: [birth year] (81 y.o. M) Treating RN: Carlene Coria Primary Care Topher Buenaventura: [name] Other Clinician: Massie Kluver Referring Zaineb Nowaczyk: [name] Treating Chava Dulac/Extender: [name] Weeks in Treatment: 5 Wound Status Wound Number: 6 Primary Etiology: Venous Leg Ulcer Wound Location: Right, Medial Lower Leg Wound Status: Open Wounding Event: Shear/Friction Comorbid History: Congestive Heart Failure, Hypertension Date Acquired: 02/20/2022 Weeks Of Treatment: 0 Clustered Wound: No Photos Wound Measurements Length: (cm) 0.9 Width: (cm) 0.6 Depth: (cm) 0.1 Area: (cm) 0.424 Volume: (cm) 0.042 % Reduction in Area: % Reduction in Volume: Epithelialization: None Tunneling: No Undermining: No Wound Description Classification: Full Thickness Without Exposed Support Structures Wound Margin: Flat and Intact Exudate Amount: Medium Exudate Type: Serosanguineous Exudate Color: red, brown Foul Odor After Cleansing: No Slough/Fibrino Yes Wound Bed Granulation Amount: Small (1-33%) Granulation Quality: Red, Pink Necrotic Amount: None Present (0%) Treatment Notes Wound #6 (Lower Leg) Wound Laterality: Right, Medial Cleanser Wound Cleanser Discharge Instruction: Wash your hands with soap and water. Remove old dressing, discard into plastic bag  and place into trash. Cleanse the wound with Wound Cleanser prior to applying a clean dressing using gauze sponges, not tissues or cotton balls. Do not scrub or use excessive force. Pat dry using gauze sponges, not tissue or cotton balls. Peri-Wound Care Topical Decandia, Aidynn D. ([id number]) Primary Dressing Silvercel Small 2x2 (in/in) Discharge Instruction: Apply Silvercel Small 2x2 (in/in) as instructed Secondary Dressing Zetuvit Plus 4x8 (in/in) Secured With Medipore Tape - 67M Medipore H Soft Cloth Surgical Tape, 2x2 (in/yd) Conform 4'' - Conforming Stretch Gauze Bandage 4x75 (in/in) Discharge Instruction: Apply as directed Tubigrip Size E, 3.5x10 (in/yds) Discharge Instruction: Apply 3 Tubigrip E 3-finger-widths below knee to base of toes to secure dressing and/or for swelling. Compression Wrap Compression Stockings Add-Ons Electronic Signature(s) Signed: 02/20/2022 4:19:23 PM By: Carlene Coria RN Signed: 02/20/2022 4:37:38 PM By: Massie Kluver Entered By: Massie Kluver on 02/20/2022 08:44:38 Mifsud, Vinayak D. ([id number]) -------------------------------------------------------------------------------- Wound Assessment Details Patient Name: [name] Ewing D. Date of Service: 02/20/2022 8:15 AM Medical Record Number: [id number] Patient Account Number: [id number] Date of Birth/Sex: [birth year] (81 y.o. M) Treating RN: Carlene Coria Primary Care Namiyah Grantham: [name] Other Clinician: Massie Kluver Referring Cayley Pester: [name] Treating Shiheem Corporan/Extender: [name] Weeks in Treatment: 5 Wound Status Wound Number: 7 Primary Etiology: Venous Leg Ulcer Wound Location: Right, Proximal, Lateral Lower Leg Wound Status: Open Wounding Event: Shear/Friction Comorbid History: Congestive Heart Failure, Hypertension Date Acquired: 02/13/2022 Weeks Of Treatment: 0 Clustered Wound: No Photos Wound Measurements Length: (cm) 3.1 Width: (cm) 3.3 Depth: (cm) 0.1 Area: (cm)  8.035 Volume: (cm) 0.803 % Reduction in Area: 0% % Reduction in Volume: 0% Epithelialization: None Tunneling: No Undermining: No Wound Description Classification: Full Thickness Without Exposed Support Structures Wound Margin: Flat and Intact Exudate Amount: Medium Exudate Type: Serosanguineous Exudate Color: red, brown Foul Odor After Cleansing: No Slough/Fibrino No Wound Bed Granulation Amount: Small (1-33%) Exposed Structure Granulation Quality: Red, Pink Fascia Exposed: No Necrotic Amount: None Present (0%) Fat Layer (Subcutaneous Tissue) Exposed: Yes Tendon Exposed: No Muscle Exposed: No Joint Exposed: No Bone Exposed: No Treatment Notes Wound #7 (Lower Leg) Wound Laterality: Right, Lateral, Proximal Cleanser Wound Cleanser Discharge Instruction: Wash your hands with soap and water. Remove old dressing, discard into plastic bag and place into trash. Cleanse the wound with Wound Cleanser prior to applying a clean dressing using gauze sponges, not tissues or cotton balls. Do not scrub or use excessive force. Pat dry using gauze sponges, not tissue or cotton balls.  BLAYNE, FRANKIE ([id number]) Peri-Wound Care Topical Primary Dressing Silvercel Small 2x2 (in/in) Discharge Instruction: Apply Silvercel Small 2x2 (in/in) as instructed Secondary Dressing Zetuvit Plus 4x8 (in/in) Secured With Medipore Tape - 62M Medipore H Soft Cloth Surgical Tape, 2x2 (in/yd) Conform 4'' - Conforming Stretch Gauze Bandage 4x75 (in/in) Discharge Instruction: Apply as directed Tubigrip Size E, 3.5x10 (in/yds) Discharge Instruction: Apply 3 Tubigrip E 3-finger-widths below knee to base of toes to secure dressing and/or for swelling. Compression Wrap Compression Stockings Add-Ons Electronic Signature(s) Signed: 02/20/2022 4:19:23 PM By: Carlene Coria RN Signed: 02/20/2022 4:37:38 PM By: Massie Kluver Entered By: Massie Kluver on 02/20/2022 08:49:14 Palmer, Eusevio D.  ([id number]) -------------------------------------------------------------------------------- Wound Assessment Details Patient Name: [name] Ewing D. Date of Service: 02/20/2022 8:15 AM Medical Record Number: [id number] Patient Account Number: [id number] Date of Birth/Sex: 11-21-1940 (81 y.o. M) Treating RN: Carlene Coria Primary Care Baylei Siebels: [name] Other Clinician: Massie Kluver Referring Ieshia Hatcher: [name] Treating Townes Fuhs/Extender: [name] Weeks in Treatment: 5 Wound Status Wound Number: 8 Primary Etiology: Venous Leg Ulcer Wound Location: Right, Distal, Lateral Lower Leg Wound Status: Open Wounding Event: Shear/Friction Comorbid History: Congestive Heart Failure, Hypertension Date Acquired: 02/13/2022 Weeks Of Treatment: 0 Clustered Wound: No Photos Wound Measurements Length: (cm) 4.5 Width: (cm) 2.9 Depth: (cm) 0.1 Area: (cm) 10.249 Volume: (cm) 1.025 % Reduction in Area: % Reduction in Volume: Epithelialization: None Tunneling: No Undermining: No Wound Description Classification: Full Thickness Without Exposed Support Structures Wound Margin: Flat and Intact Exudate Amount: Medium Exudate Type: Serosanguineous Exudate Color: red, brown Foul Odor After Cleansing: No Slough/Fibrino No Wound Bed Granulation Amount: Small (1-33%) Exposed Structure Granulation Quality: Red Fascia Exposed: No Necrotic Amount: None Present (0%) Fat Layer (Subcutaneous Tissue) Exposed: Yes Tendon Exposed: No Muscle Exposed: No Joint Exposed: No Bone Exposed: No Treatment Notes Wound #8 (Lower Leg) Wound Laterality: Right, Lateral, Distal Cleanser Wound Cleanser Discharge Instruction: Wash your hands with soap and water. Remove old dressing, discard into plastic bag and place into trash. Cleanse the wound with Wound Cleanser prior to applying a clean dressing using gauze sponges, not tissues or cotton balls. Do not scrub or use excessive force. Pat dry using  gauze sponges, not tissue or cotton balls. JAVEL, HERSH ([id number]) Peri-Wound Care Topical Primary Dressing Silvercel Small 2x2 (in/in) Discharge Instruction: Apply Silvercel Small 2x2 (in/in) as instructed Secondary Dressing Zetuvit Plus 4x8 (in/in) Secured With Medipore Tape - 62M Medipore H Soft Cloth Surgical Tape, 2x2 (in/yd) Conform 4'' - Conforming Stretch Gauze Bandage 4x75 (in/in) Discharge Instruction: Apply as directed Tubigrip Size E, 3.5x10 (in/yds) Discharge Instruction: Apply 3 Tubigrip E 3-finger-widths below knee to base of toes to secure dressing and/or for swelling. Compression Wrap Compression Stockings Add-Ons Electronic Signature(s) Signed: 02/20/2022 4:19:23 PM By: Carlene Coria RN Signed: 02/20/2022 4:37:38 PM By: Massie Kluver Entered By: Massie Kluver on 02/20/2022 08:48:07 Marcott, Antaeus D. ([id number]) -------------------------------------------------------------------------------- Vitals Details Patient Name: [name] Ewing D. Date of Service: 02/20/2022 8:15 AM Medical Record Number: [id number] Patient Account Number: [id number] Date of Birth/Sex: [birth year] (81 y.o. M) Treating RN: Carlene Coria Primary Care Carman Auxier: [name] Other Clinician: Massie Kluver Referring Alyssa Mancera: [name] Treating Cammie Faulstich/Extender: Skipper Cliche in Treatment: 5 Vital Signs Time Taken: 08:25 Temperature (F): 97.7 Height (in): 74 Pulse (bpm): 64 Weight (lbs): 244 Respiratory Rate (breaths/min): 18 Body Mass Index (BMI): 31.3 Blood Pressure (mmHg): 126/66 Reference Range: 80 - 120 mg / dl Electronic Signature(s) Signed: 02/20/2022 4:37:38 PM By: Massie Kluver Entered By: Massie Kluver on 02/20/2022  08:27:13 

## 2022-02-25 DIAGNOSIS — I87333 Chronic venous hypertension (idiopathic) with ulcer and inflammation of bilateral lower extremity: Secondary | ICD-10-CM | POA: Diagnosis not present

## 2022-02-25 NOTE — Progress Notes (Signed)
[name] ([id number]) Visit Report for 02/25/2022 Arrival Information Details Patient Name: [name], [name]. Date of Service: 02/25/2022 8:00 AM Medical Record Number: [id number] Patient Account Number: [id number] Date of Birth/Sex: 06-19-1941 (81 y.o. M) Treating RN: Cornell Barman Primary Care Hulon Ferron: [name] Other Clinician: Referring Alanea Woolridge: [name] Treating Tobin Witucki/Extender: Skipper Cliche in Treatment: 5 Visit Information History Since Last Visit Added or deleted any medications: No Patient Arrived: Walker Has Dressing in Place as Prescribed: Yes Arrival Time: 08:09 Has Compression in Place as Prescribed: Yes Accompanied By: self Pain Present Now: No Transfer Assistance: None Patient Identification Verified: Yes Patient Requires Transmission-Based Precautions: No Patient Has Alerts: No Electronic Signature(s) Signed: 02/25/2022 2:53:55 PM By: Gretta Cool, BSN, RN, CWS, Kim RN, BSN Entered By: Gretta Cool, BSN, RN, CWS, Kim on 02/25/2022 08:12:31 Spare, [name] ([id number]) -------------------------------------------------------------------------------- Clinic Level of Care Assessment Details Patient Name: [name] Ewing D. Date of Service: 02/25/2022 8:00 AM Medical Record Number: [id number] Patient Account Number: [id number] Date of Birth/Sex: [birth year] (81 y.o. M) Treating RN: Cornell Barman Primary Care Roquel Burgin: [name] Other Clinician: Referring Kalayah Leske: [name] Treating Analynn Daum/Extender: Skipper Cliche in Treatment: 5 Clinic Level of Care Assessment Items TOOL 4 Quantity Score []  - Use when only an EandM is performed on FOLLOW-UP visit 0 ASSESSMENTS - Nursing Assessment / Reassessment []  - Reassessment of Co-morbidities (includes updates in patient status) 0 []  - 0 Reassessment of Adherence to Treatment Plan ASSESSMENTS - Wound and Skin Assessment / Reassessment []  - Simple Wound Assessment / Reassessment - one wound 0 X- 4 5 Complex  Wound Assessment / Reassessment - multiple wounds []  - 0 Dermatologic / Skin Assessment (not related to wound area) ASSESSMENTS - Focused Assessment []  - Circumferential Edema Measurements - multi extremities 0 []  - 0 Nutritional Assessment / Counseling / Intervention []  - 0 Lower Extremity Assessment (monofilament, tuning fork, pulses) []  - 0 Peripheral Arterial Disease Assessment (using hand held doppler) ASSESSMENTS - Ostomy and/or Continence Assessment and Care []  - Incontinence Assessment and Management 0 []  - 0 Ostomy Care Assessment and Management (repouching, etc.) PROCESS - Coordination of Care X - Simple Patient / Family Education for ongoing care 1 15 []  - 0 Complex (extensive) Patient / Family Education for ongoing care X- 1 10 Staff obtains Consents, Records, Test Results / Process Orders []  - 0 Staff telephones HHA, Nursing Homes / Clarify orders / etc []  - 0 Routine Transfer to another Facility (non-emergent condition) []  - 0 Routine Hospital Admission (non-emergent condition) []  - 0 New Admissions / Biomedical engineer / Ordering NPWT, Apligraf, etc. []  - 0 Emergency Hospital Admission (emergent condition) []  - 0 Simple Discharge Coordination []  - 0 Complex (extensive) Discharge Coordination PROCESS - Special Needs []  - Pediatric / Minor Patient Management 0 []  - 0 Isolation Patient Management []  - 0 Hearing / Language / Visual special needs []  - 0 Assessment of Community assistance (transportation, D/C planning, etc.) []  - 0 Additional assistance / Altered mentation []  - 0 Support Surface(s) Assessment (bed, cushion, seat, etc.) INTERVENTIONS - Wound Cleansing / Measurement Claw, Lavin D. ([id number]) []  - 0 Simple Wound Cleansing - one wound X- 4 5 Complex Wound Cleansing - multiple wounds []  - 0 Wound Imaging (photographs - any number of wounds) []  - 0 Wound Tracing (instead of photographs) []  - 0 Simple Wound Measurement - one  wound X- 4 5 Complex Wound Measurement - multiple wounds INTERVENTIONS - Wound Dressings X - Small Wound Dressing one or multiple  wounds 1 10 []  - 0 Medium Wound Dressing one or multiple wounds X- 1 20 Large Wound Dressing one or multiple wounds []  - 0 Application of Medications - topical []  - 0 Application of Medications - injection INTERVENTIONS - Miscellaneous []  - External ear exam 0 []  - 0 Specimen Collection (cultures, biopsies, blood, body fluids, etc.) []  - 0 Specimen(s) / Culture(s) sent or taken to Lab for analysis []  - 0 Patient Transfer (multiple staff / Civil Service fast streamer / Similar devices) []  - 0 Simple Staple / Suture removal (25 or less) []  - 0 Complex Staple / Suture removal (26 or more) []  - 0 Hypo / Hyperglycemic Management (close monitor of Blood Glucose) []  - 0 Ankle / Brachial Index (ABI) - do not check if billed separately []  - 0 Vital Signs Has the patient been seen at the hospital within the last three years: Yes Total Score: 115 Level Of Care: New/Established - Level 3 Electronic Signature(s) Signed: 02/25/2022 2:53:55 PM By: Gretta Cool, BSN, RN, CWS, Kim RN, BSN Entered By: Gretta Cool, BSN, RN, CWS, Kim on 02/25/2022 08:31:41 [name], [name] ([id number]) -------------------------------------------------------------------------------- Encounter Discharge Information Details Patient Name: [name] Ewing D. Date of Service: 02/25/2022 8:00 AM Medical Record Number: [id number] Patient Account Number: [id number] Date of Birth/Sex: [birth year] (81 y.o. M) Treating RN: Cornell Barman Primary Care Halen Mossbarger: [name] Other Clinician: Referring Bravery Ketcham: [name] Treating Elgar Scoggins/Extender: Skipper Cliche in Treatment: 5 Encounter Discharge Information Items Discharge Condition: Stable Ambulatory Status: Wheelchair Discharge Destination: Home Transportation: Private Auto Schedule Follow-up Appointment: Yes Clinical Summary of Care: Electronic  Signature(s) Signed: 02/25/2022 2:53:55 PM By: Gretta Cool, BSN, RN, CWS, Kim RN, BSN Entered By: Gretta Cool, BSN, RN, CWS, Kim on 02/25/2022 08:30:50 [name] ([id number]) -------------------------------------------------------------------------------- Wound Assessment Details Patient Name: CEFERINO, LANG D. Date of Service: 02/25/2022 8:00 AM Medical Record Number: [id number] Patient Account Number: [id number] Date of Birth/Sex: 03-05-1941 (81 y.o. M) Treating RN: Cornell Barman Primary Care Dennis Hegeman: [name] Other Clinician: Referring Kassidi Elza: [name] Treating Kedar Sedano/Extender: [name] Weeks in Treatment: 5 Wound Status Wound Number: 2 Primary Etiology: Neuropathic Ulcer-Non Diabetic Wound Location: Right, Plantar Foot Wound Status: Open Wounding Event: Gradually Appeared Date Acquired: 12/28/2020 Weeks Of Treatment: 5 Clustered Wound: No Wound Measurements Length: (cm) 1.2 Width: (cm) 0.7 Depth: (cm) 0.1 Area: (cm) 0.66 Volume: (cm) 0.066 % Reduction in Area: -735.4% % Reduction in Volume: -175% Wound Description Classification: Full Thickness Without Exposed Support Structu Exudate Amount: Medium Exudate Type: Serosanguineous Exudate Color: red, brown res Treatment Notes Wound #2 (Foot) Wound Laterality: Plantar, Right Cleanser Wound Cleanser Discharge Instruction: Wash your hands with soap and water. Remove old dressing, discard into plastic bag and place into trash. Cleanse the wound with Wound Cleanser prior to applying a clean dressing using gauze sponges, not tissues or cotton balls. Do not scrub or use excessive force. Pat dry using gauze sponges, not tissue or cotton balls. Peri-Wound Care Topical Primary Dressing Silvercel Small 2x2 (in/in) Discharge Instruction: Apply Silvercel Small 2x2 (in/in) as instructed Secondary Dressing Zetuvit Plus 4x8 (in/in) Secured With Medipore Tape - 65M Medipore H Soft Cloth Surgical Tape, 2x2 (in/yd) Conform  4'' - Conforming Stretch Gauze Bandage 4x75 (in/in) Discharge Instruction: Apply as directed Tubigrip Size E, 3.5x10 (in/yds) Discharge Instruction: Apply 3 Tubigrip E 3-finger-widths below knee to base of toes to secure dressing and/or for swelling. Compression Wrap Compression Stockings Add-Ons Electronic Signature(s) MUJTABA, BOLLIG ([id number]) Signed: 02/25/2022 2:53:55 PM By: Gretta Cool, BSN, RN, CWS, Kim RN, BSN Entered  By: Gretta Cool, BSN, RN, CWS, Kim on 02/25/2022 08:29:51 Kirkes, [name] ([id number]) -------------------------------------------------------------------------------- Wound Assessment Details Patient Name: JARAE, PANAS D. Date of Service: 02/25/2022 8:00 AM Medical Record Number: [id number] Patient Account Number: [id number] Date of Birth/Sex: 1941/04/19 (81 y.o. M) Treating RN: Cornell Barman Primary Care Tonnette Zwiebel: [name] Other Clinician: Referring Eragon Hammond: [name] Treating Kimarion Chery/Extender: [name] Weeks in Treatment: 5 Wound Status Wound Number: 6 Primary Etiology: Venous Leg Ulcer Wound Location: Right, Medial Lower Leg Wound Status: Open Wounding Event: Shear/Friction Date Acquired: 02/20/2022 Weeks Of Treatment: 0 Clustered Wound: No Wound Measurements Length: (cm) 0.9 Width: (cm) 0.6 Depth: (cm) 0.1 Area: (cm) 0.424 Volume: (cm) 0.042 % Reduction in Area: 0% % Reduction in Volume: 0% Wound Description Classification: Full Thickness Without Exposed Support Structu Exudate Amount: Medium Exudate Type: Serosanguineous Exudate Color: red, brown res Treatment Notes Wound #6 (Lower Leg) Wound Laterality: Right, Medial Cleanser Wound Cleanser Discharge Instruction: Wash your hands with soap and water. Remove old dressing, discard into plastic bag and place into trash. Cleanse the wound with Wound Cleanser prior to applying a clean dressing using gauze sponges, not tissues or cotton balls. Do not scrub or use excessive force. Pat dry  using gauze sponges, not tissue or cotton balls. Peri-Wound Care Topical Primary Dressing Silvercel Small 2x2 (in/in) Discharge Instruction: Apply Silvercel Small 2x2 (in/in) as instructed Secondary Dressing Zetuvit Plus 4x8 (in/in) Secured With Medipore Tape - 59M Medipore H Soft Cloth Surgical Tape, 2x2 (in/yd) Conform 4'' - Conforming Stretch Gauze Bandage 4x75 (in/in) Discharge Instruction: Apply as directed Tubigrip Size E, 3.5x10 (in/yds) Discharge Instruction: Apply 3 Tubigrip E 3-finger-widths below knee to base of toes to secure dressing and/or for swelling. Compression Wrap Compression Stockings Add-Ons Electronic Signature(s) LENDON, GEORGE ([id number]) Signed: 02/25/2022 2:53:55 PM By: Gretta Cool, BSN, RN, CWS, Kim RN, BSN Entered By: Gretta Cool, BSN, RN, CWS, Kim on 02/25/2022 08:29:51 [name] ([id number]) -------------------------------------------------------------------------------- Wound Assessment Details Patient Name: CRISTIN, SZATKOWSKI D. Date of Service: 02/25/2022 8:00 AM Medical Record Number: [id number] Patient Account Number: [id number] Date of Birth/Sex: 1940/12/15 (81 y.o. M) Treating RN: Cornell Barman Primary Care Harlee Eckroth: [name] Other Clinician: Referring Kyliee Ortego: [name] Treating Genee Rann/Extender: [name] Weeks in Treatment: 5 Wound Status Wound Number: 7 Primary Etiology: Venous Leg Ulcer Wound Location: Right, Proximal, Lateral Lower Leg Wound Status: Open Wounding Event: Shear/Friction Date Acquired: 02/13/2022 Weeks Of Treatment: 0 Clustered Wound: No Wound Measurements Length: (cm) 3.1 Width: (cm) 3.3 Depth: (cm) 0.1 Area: (cm) 8.035 Volume: (cm) 0.803 % Reduction in Area: 0% % Reduction in Volume: 0% Wound Description Classification: Full Thickness Without Exposed Support Structu Exudate Amount: Medium Exudate Type: Serosanguineous Exudate Color: red, brown res Treatment Notes Wound #7 (Lower Leg) Wound  Laterality: Right, Lateral, Proximal Cleanser Wound Cleanser Discharge Instruction: Wash your hands with soap and water. Remove old dressing, discard into plastic bag and place into trash. Cleanse the wound with Wound Cleanser prior to applying a clean dressing using gauze sponges, not tissues or cotton balls. Do not scrub or use excessive force. Pat dry using gauze sponges, not tissue or cotton balls. Peri-Wound Care Topical Primary Dressing Silvercel Small 2x2 (in/in) Discharge Instruction: Apply Silvercel Small 2x2 (in/in) as instructed Secondary Dressing Zetuvit Plus 4x8 (in/in) Secured With Medipore Tape - 59M Medipore H Soft Cloth Surgical Tape, 2x2 (in/yd) Conform 4'' - Conforming Stretch Gauze Bandage 4x75 (in/in) Discharge Instruction: Apply as directed Tubigrip Size E, 3.5x10 (in/yds) Discharge Instruction: Apply 3 Tubigrip E 3-finger-widths below knee  to base of toes to secure dressing and/or for swelling. Compression Wrap Compression Stockings Add-Ons Electronic Signature(s) JAMOL, GINYARD ([id number]) Signed: 02/25/2022 2:53:55 PM By: Gretta Cool, BSN, RN, CWS, Kim RN, BSN Entered By: Gretta Cool, BSN, RN, CWS, Kim on 02/25/2022 08:29:51 [name] ([id number]) -------------------------------------------------------------------------------- Wound Assessment Details Patient Name: ZYSHONNE, MALECHA D. Date of Service: 02/25/2022 8:00 AM Medical Record Number: [id number] Patient Account Number: [id number] Date of Birth/Sex: 07/20/1940 (81 y.o. M) Treating RN: Cornell Barman Primary Care Zenon Leaf: [name] Other Clinician: Referring Nonnie Pickney: [name] Treating Dilon Lank/Extender: [name] Weeks in Treatment: 5 Wound Status Wound Number: 8 Primary Etiology: Venous Leg Ulcer Wound Location: Right, Distal, Lateral Lower Leg Wound Status: Open Wounding Event: Shear/Friction Date Acquired: 02/13/2022 Weeks Of Treatment: 0 Clustered Wound: No Wound Measurements Length:  (cm) 4.5 Width: (cm) 2.9 Depth: (cm) 0.1 Area: (cm) 10.249 Volume: (cm) 1.025 % Reduction in Area: 0% % Reduction in Volume: 0% Wound Description Classification: Full Thickness Without Exposed Support Structu Exudate Amount: Medium Exudate Type: Serosanguineous Exudate Color: red, brown res Treatment Notes Wound #8 (Lower Leg) Wound Laterality: Right, Lateral, Distal Cleanser Wound Cleanser Discharge Instruction: Wash your hands with soap and water. Remove old dressing, discard into plastic bag and place into trash. Cleanse the wound with Wound Cleanser prior to applying a clean dressing using gauze sponges, not tissues or cotton balls. Do not scrub or use excessive force. Pat dry using gauze sponges, not tissue or cotton balls. Peri-Wound Care Topical Primary Dressing Silvercel Small 2x2 (in/in) Discharge Instruction: Apply Silvercel Small 2x2 (in/in) as instructed Secondary Dressing Zetuvit Plus 4x8 (in/in) Secured With Medipore Tape - 42M Medipore H Soft Cloth Surgical Tape, 2x2 (in/yd) Conform 4'' - Conforming Stretch Gauze Bandage 4x75 (in/in) Discharge Instruction: Apply as directed Tubigrip Size E, 3.5x10 (in/yds) Discharge Instruction: Apply 3 Tubigrip E 3-finger-widths below knee to base of toes to secure dressing and/or for swelling. Compression Wrap Compression Stockings Add-Ons Electronic Signature(s) ARHUM, PEEPLES ([id number]) Signed: 02/25/2022 2:53:55 PM By: Gretta Cool, BSN, RN, CWS, Kim RN, BSN Entered By: Gretta Cool, BSN, RN, CWS, Kim on 02/25/2022 08:29:51

## 2022-02-25 NOTE — Progress Notes (Signed)
LARAMIE, MEISSNER ([id number]) Visit Report for 02/25/2022 Physician Orders Details Patient Name: [name], [name]. Date of Service: 02/25/2022 8:00 AM Medical Record Number: [id number] Patient Account Number: [id number] Date of Birth/Sex: 1941-04-06 (81 y.o. M) Treating RN: Cornell Barman Primary Care Provider: [name] Other Clinician: Referring Provider: [name] Treating Provider/Extender: Skipper Cliche in Treatment: 5 Verbal / Phone Orders: No Diagnosis Coding Follow-up Appointments o Return Appointment in 1 week. o Nurse Visit as needed Bathing/ Shower/ Hygiene o May shower with wound dressing protected with water repellent cover or cast protector. Anesthetic (Use 'Patient Medications' Section for Anesthetic Order Entry) o Lidocaine applied to wound bed Edema Control - Lymphedema / Segmental Compressive Device / Other o Tubigrip double layer applied - tubi E double layer o Elevate, Exercise Daily and Avoid Standing for Long Periods of Time. o Elevate legs to the level of the heart and pump ankles as often as possible o Elevate leg(s) parallel to the floor when sitting. Wound Treatment Wound #2 - Foot Wound Laterality: Plantar, Right Cleanser: Wound Cleanser 1 x Per Week/30 Days Discharge Instructions: Wash your hands with soap and water. Remove old dressing, discard into plastic bag and place into trash. Cleanse the wound with Wound Cleanser prior to applying a clean dressing using gauze sponges, not tissues or cotton balls. Do not scrub or use excessive force. Pat dry using gauze sponges, not tissue or cotton balls. Primary Dressing: Silvercel Small 2x2 (in/in) 1 x Per Week/30 Days Discharge Instructions: Apply Silvercel Small 2x2 (in/in) as instructed Secondary Dressing: Zetuvit Plus 4x8 (in/in) 1 x Per Week/30 Days Secured With: Medipore Tape - 30M Medipore H Soft Cloth Surgical Tape, 2x2 (in/yd) 1 x Per Week/30 Days Secured With: Conform 4'' -  Conforming Stretch Gauze Bandage 4x75 (in/in) 1 x Per Week/30 Days Discharge Instructions: Apply as directed Secured With: Tubigrip Size E, 3.5x10 (in/yds) 1 x Per Week/30 Days Discharge Instructions: Apply 3 Tubigrip E 3-finger-widths below knee to base of toes to secure dressing and/or for swelling. Wound #6 - Lower Leg Wound Laterality: Right, Medial Cleanser: Wound Cleanser 1 x Per Week/30 Days Discharge Instructions: Wash your hands with soap and water. Remove old dressing, discard into plastic bag and place into trash. Cleanse the wound with Wound Cleanser prior to applying a clean dressing using gauze sponges, not tissues or cotton balls. Do not scrub or use excessive force. Pat dry using gauze sponges, not tissue or cotton balls. Primary Dressing: Silvercel Small 2x2 (in/in) 1 x Per Week/30 Days Discharge Instructions: Apply Silvercel Small 2x2 (in/in) as instructed Secondary Dressing: Zetuvit Plus 4x8 (in/in) 1 x Per Week/30 Days Secured With: Medipore Tape - 30M Medipore H Soft Cloth Surgical Tape, 2x2 (in/yd) 1 x Per Week/30 Days Secured With: Conform 4'' - Conforming Stretch Gauze Bandage 4x75 (in/in) 1 x Per Week/30 Days Discharge Instructions: Apply as directed [name], [name] ([id number]) Secured With: Tubigrip Size E, 3.5x10 (in/yds) 1 x Per Week/30 Days Discharge Instructions: Apply 3 Tubigrip E 3-finger-widths below knee to base of toes to secure dressing and/or for swelling. Wound #7 - Lower Leg Wound Laterality: Right, Lateral, Proximal Cleanser: Wound Cleanser 1 x Per Week/30 Days Discharge Instructions: Wash your hands with soap and water. Remove old dressing, discard into plastic bag and place into trash. Cleanse the wound with Wound Cleanser prior to applying a clean dressing using gauze sponges, not tissues or cotton balls. Do not scrub or use excessive force. Pat dry using gauze sponges, not tissue or  cotton balls. Primary Dressing: Silvercel Small 2x2 (in/in) 1 x  Per Week/30 Days Discharge Instructions: Apply Silvercel Small 2x2 (in/in) as instructed Secondary Dressing: Zetuvit Plus 4x8 (in/in) 1 x Per Week/30 Days Secured With: Medipore Tape - 61M Medipore H Soft Cloth Surgical Tape, 2x2 (in/yd) 1 x Per Week/30 Days Secured With: Conform 4'' - Conforming Stretch Gauze Bandage 4x75 (in/in) 1 x Per Week/30 Days Discharge Instructions: Apply as directed Secured With: Tubigrip Size E, 3.5x10 (in/yds) 1 x Per Week/30 Days Discharge Instructions: Apply 3 Tubigrip E 3-finger-widths below knee to base of toes to secure dressing and/or for swelling. Wound #8 - Lower Leg Wound Laterality: Right, Lateral, Distal Cleanser: Wound Cleanser 1 x Per Week/30 Days Discharge Instructions: Wash your hands with soap and water. Remove old dressing, discard into plastic bag and place into trash. Cleanse the wound with Wound Cleanser prior to applying a clean dressing using gauze sponges, not tissues or cotton balls. Do not scrub or use excessive force. Pat dry using gauze sponges, not tissue or cotton balls. Primary Dressing: Silvercel Small 2x2 (in/in) 1 x Per Week/30 Days Discharge Instructions: Apply Silvercel Small 2x2 (in/in) as instructed Secondary Dressing: Zetuvit Plus 4x8 (in/in) 1 x Per Week/30 Days Secured With: Medipore Tape - 61M Medipore H Soft Cloth Surgical Tape, 2x2 (in/yd) 1 x Per Week/30 Days Secured With: Conform 4'' - Conforming Stretch Gauze Bandage 4x75 (in/in) 1 x Per Week/30 Days Discharge Instructions: Apply as directed Secured With: Tubigrip Size E, 3.5x10 (in/yds) 1 x Per Week/30 Days Discharge Instructions: Apply 3 Tubigrip E 3-finger-widths below knee to base of toes to secure dressing and/or for swelling. Electronic Signature(s) Signed: 02/25/2022 2:53:55 PM By: Gretta Cool, BSN, RN, CWS, Kim RN, BSN Signed: 02/25/2022 4:37:11 PM By: Worthy Keeler PA-C Entered By: Gretta Cool BSN, RN, CWS, Kim on 02/25/2022 08:30:27 [name], [name]  ([id number]) -------------------------------------------------------------------------------- [facility] Details Patient Name: [name] Ewing D. Date of Service: 02/25/2022 Medical Record Number: [id number] Patient Account Number: [id number] Date of Birth/Sex: 02-15-1941 (81 y.o. M) Treating RN: Cornell Barman Primary Care Provider: [name] Other Clinician: Referring Provider: [name] Treating Provider/Extender: Skipper Cliche in Treatment: 5 Diagnosis Coding ICD-10 Codes Code Description [phone] Chronic venous hypertension (idiopathic) with ulcer and inflammation of bilateral lower extremity L97.812 Non-pressure chronic ulcer of other part of right lower leg with fat layer exposed L97.822 Non-pressure chronic ulcer of other part of left lower leg with fat layer exposed G90.09 Other idiopathic peripheral autonomic neuropathy L97.522 Non-pressure chronic ulcer of other part of left foot with fat layer exposed L97.512 Non-pressure chronic ulcer of other part of right foot with fat layer exposed I10 Essential (primary) hypertension I50.42 Chronic combined systolic (congestive) and diastolic (congestive) heart failure I48.0 Paroxysmal atrial fibrillation I42.0 Dilated cardiomyopathy Z79.01 Long term (current) use of anticoagulants I25.10 Atherosclerotic heart disease of native coronary artery without angina pectoris N18.32 Chronic kidney disease, stage 3b Facility Procedures CPT4 Code: 23300762 Description: 26333 - WOUND CARE VISIT-LEV 3 EST PT Modifier: Quantity: 1 Electronic Signature(s) Signed: 02/25/2022 2:53:55 PM By: Gretta Cool, BSN, RN, CWS, Kim RN, BSN Signed: 02/25/2022 4:37:11 PM By: Worthy Keeler PA-C Entered By: Gretta Cool, BSN, RN, CWS, Kim on 02/25/2022 08:31:47

## 2022-02-28 ENCOUNTER — Encounter: Payer: PPO | Attending: Physician Assistant

## 2022-02-28 DIAGNOSIS — L97522 Non-pressure chronic ulcer of other part of left foot with fat layer exposed: Secondary | ICD-10-CM | POA: Insufficient documentation

## 2022-02-28 DIAGNOSIS — E11621 Type 2 diabetes mellitus with foot ulcer: Secondary | ICD-10-CM | POA: Diagnosis not present

## 2022-02-28 DIAGNOSIS — I130 Hypertensive heart and chronic kidney disease with heart failure and stage 1 through stage 4 chronic kidney disease, or unspecified chronic kidney disease: Secondary | ICD-10-CM | POA: Insufficient documentation

## 2022-02-28 DIAGNOSIS — E1122 Type 2 diabetes mellitus with diabetic chronic kidney disease: Secondary | ICD-10-CM | POA: Insufficient documentation

## 2022-02-28 DIAGNOSIS — L97512 Non-pressure chronic ulcer of other part of right foot with fat layer exposed: Secondary | ICD-10-CM | POA: Insufficient documentation

## 2022-02-28 DIAGNOSIS — N1832 Chronic kidney disease, stage 3b: Secondary | ICD-10-CM | POA: Diagnosis not present

## 2022-02-28 DIAGNOSIS — G9009 Other idiopathic peripheral autonomic neuropathy: Secondary | ICD-10-CM | POA: Insufficient documentation

## 2022-02-28 DIAGNOSIS — Z7901 Long term (current) use of anticoagulants: Secondary | ICD-10-CM | POA: Diagnosis not present

## 2022-02-28 DIAGNOSIS — I420 Dilated cardiomyopathy: Secondary | ICD-10-CM | POA: Diagnosis not present

## 2022-02-28 DIAGNOSIS — L97822 Non-pressure chronic ulcer of other part of left lower leg with fat layer exposed: Secondary | ICD-10-CM | POA: Diagnosis not present

## 2022-02-28 DIAGNOSIS — I87333 Chronic venous hypertension (idiopathic) with ulcer and inflammation of bilateral lower extremity: Secondary | ICD-10-CM | POA: Diagnosis not present

## 2022-02-28 DIAGNOSIS — Z79899 Other long term (current) drug therapy: Secondary | ICD-10-CM | POA: Diagnosis not present

## 2022-02-28 DIAGNOSIS — I480 Paroxysmal atrial fibrillation: Secondary | ICD-10-CM | POA: Diagnosis not present

## 2022-02-28 DIAGNOSIS — I5042 Chronic combined systolic (congestive) and diastolic (congestive) heart failure: Secondary | ICD-10-CM | POA: Diagnosis not present

## 2022-02-28 DIAGNOSIS — I2510 Atherosclerotic heart disease of native coronary artery without angina pectoris: Secondary | ICD-10-CM | POA: Diagnosis not present

## 2022-02-28 DIAGNOSIS — L97812 Non-pressure chronic ulcer of other part of right lower leg with fat layer exposed: Secondary | ICD-10-CM | POA: Insufficient documentation

## 2022-02-28 NOTE — Progress Notes (Signed)
[name] ([id number]) Visit Report for 02/28/2022 Physician Orders Details Patient Name: [name], [name]. Date of Service: 02/28/2022 7:30 AM Medical Record Number: [id number] Patient Account Number: [id number] Date of Birth/Sex: 02-19-1941 (81 y.o. M) Treating RN: [name] Primary Care Provider: Tally [name] Other Clinician: Referring Provider: Tally [name] Treating Provider/Extender: [name] in Treatment: 6 Verbal / Phone Orders: No Diagnosis Coding Follow-up Appointments o Return Appointment in 1 week. o Nurse Visit as needed Bathing/ Shower/ Hygiene o May shower with wound dressing protected with water repellent cover or cast protector. Anesthetic (Use 'Patient Medications' Section for Anesthetic Order Entry) o Lidocaine applied to wound bed Edema Control - Lymphedema / Segmental Compressive Device / Other o Tubigrip double layer applied - tubi E double layer o Elevate, Exercise Daily and Avoid Standing for Long Periods of Time. o Elevate legs to the level of the heart and pump ankles as often as possible o Elevate leg(s) parallel to the floor when sitting. Wound Treatment Wound #2 - Foot Wound Laterality: Plantar, Right Cleanser: Wound Cleanser 3 x Per Week/30 Days Discharge Instructions: Wash your hands with soap and water. Remove old dressing, discard into plastic bag and place into trash. Cleanse the wound with Wound Cleanser prior to applying a clean dressing using gauze sponges, not tissues or cotton balls. Do not scrub or use excessive force. Pat dry using gauze sponges, not tissue or cotton balls. Primary Dressing: Silvercel Small 2x2 (in/in) 3 x Per Week/30 Days Discharge Instructions: Apply Silvercel Small 2x2 (in/in) as instructed Secondary Dressing: Zetuvit Plus 4x8 (in/in) 3 x Per Week/30 Days Secured With: Medipore Tape - 34M Medipore H Soft Cloth Surgical Tape, 2x2 (in/yd) 3 x Per Week/30 Days Secured With: Conform 4'' -  Conforming Stretch Gauze Bandage 4x75 (in/in) 3 x Per Week/30 Days Discharge Instructions: Apply as directed Secured With: Tubigrip Size E, 3.5x10 (in/yds) 3 x Per Week/30 Days Discharge Instructions: Apply 3 Tubigrip E 3-finger-widths below knee to base of toes to secure dressing and/or for swelling. Wound #6 - Lower Leg Wound Laterality: Right, Medial Cleanser: Wound Cleanser 3 x Per Week/30 Days Discharge Instructions: Wash your hands with soap and water. Remove old dressing, discard into plastic bag and place into trash. Cleanse the wound with Wound Cleanser prior to applying a clean dressing using gauze sponges, not tissues or cotton balls. Do not scrub or use excessive force. Pat dry using gauze sponges, not tissue or cotton balls. Primary Dressing: Silvercel Small 2x2 (in/in) 3 x Per Week/30 Days Discharge Instructions: Apply Silvercel Small 2x2 (in/in) as instructed Secondary Dressing: Zetuvit Plus 4x8 (in/in) 3 x Per Week/30 Days Secured With: Medipore Tape - 34M Medipore H Soft Cloth Surgical Tape, 2x2 (in/yd) 3 x Per Week/30 Days Secured With: Conform 4'' - Conforming Stretch Gauze Bandage 4x75 (in/in) 3 x Per Week/30 Days Discharge Instructions: Apply as directed [name], [name] ([id number]) Secured With: Tubigrip Size E, 3.5x10 (in/yds) 3 x Per Week/30 Days Discharge Instructions: Apply 3 Tubigrip E 3-finger-widths below knee to base of toes to secure dressing and/or for swelling. Wound #7 - Lower Leg Wound Laterality: Right, Lateral, Proximal Cleanser: Wound Cleanser 3 x Per Week/30 Days Discharge Instructions: Wash your hands with soap and water. Remove old dressing, discard into plastic bag and place into trash. Cleanse the wound with Wound Cleanser prior to applying a clean dressing using gauze sponges, not tissues or cotton balls. Do not scrub or use excessive force. Pat dry using gauze sponges, not tissue or  cotton balls. Primary Dressing: Silvercel Small 2x2 (in/in) 3 x  Per Week/30 Days Discharge Instructions: Apply Silvercel Small 2x2 (in/in) as instructed Secondary Dressing: Zetuvit Plus 4x8 (in/in) 3 x Per Week/30 Days Secured With: Medipore Tape - 70M Medipore H Soft Cloth Surgical Tape, 2x2 (in/yd) 3 x Per Week/30 Days Secured With: Conform 4'' - Conforming Stretch Gauze Bandage 4x75 (in/in) 3 x Per Week/30 Days Discharge Instructions: Apply as directed Secured With: Tubigrip Size E, 3.5x10 (in/yds) 3 x Per Week/30 Days Discharge Instructions: Apply 3 Tubigrip E 3-finger-widths below knee to base of toes to secure dressing and/or for swelling. Wound #8 - Lower Leg Wound Laterality: Right, Lateral, Distal Cleanser: Wound Cleanser 3 x Per Week/30 Days Discharge Instructions: Wash your hands with soap and water. Remove old dressing, discard into plastic bag and place into trash. Cleanse the wound with Wound Cleanser prior to applying a clean dressing using gauze sponges, not tissues or cotton balls. Do not scrub or use excessive force. Pat dry using gauze sponges, not tissue or cotton balls. Primary Dressing: Silvercel Small 2x2 (in/in) 3 x Per Week/30 Days Discharge Instructions: Apply Silvercel Small 2x2 (in/in) as instructed Secondary Dressing: Zetuvit Plus 4x8 (in/in) 3 x Per Week/30 Days Secured With: Medipore Tape - 70M Medipore H Soft Cloth Surgical Tape, 2x2 (in/yd) 3 x Per Week/30 Days Secured With: Conform 4'' - Conforming Stretch Gauze Bandage 4x75 (in/in) 3 x Per Week/30 Days Discharge Instructions: Apply as directed Secured With: Tubigrip Size E, 3.5x10 (in/yds) 3 x Per Week/30 Days Discharge Instructions: Apply 3 Tubigrip E 3-finger-widths below knee to base of toes to secure dressing and/or for swelling. Electronic Signature(s) Signed: 02/28/2022 12:54:01 PM By: [name], BSN, RN, CWS, Kim RN, BSN Signed: 02/28/2022 1:57:25 PM By: [name] Keeler PA-C Entered By: [name] BSN, RN, CWS, [name] on 02/28/2022 08:05:31 [name]  ([id number]) -------------------------------------------------------------------------------- [place] Details Patient Name: [name] Ewing D. Date of Service: 02/28/2022 Medical Record Number: [id number] Patient Account Number: [id number] Date of Birth/Sex: June 02, 1941 (81 y.o. M) Treating RN: [name] Primary Care Provider: Tally [name] Other Clinician: Referring Provider: Tally [name] Treating Provider/Extender: [name] in Treatment: 6 Diagnosis Coding ICD-10 Codes Code Description [phone] Chronic venous hypertension (idiopathic) with ulcer and inflammation of bilateral lower extremity L97.812 Non-pressure chronic ulcer of other part of right lower leg with fat layer exposed L97.822 Non-pressure chronic ulcer of other part of left lower leg with fat layer exposed G90.09 Other idiopathic peripheral autonomic neuropathy L97.522 Non-pressure chronic ulcer of other part of left foot with fat layer exposed L97.512 Non-pressure chronic ulcer of other part of right foot with fat layer exposed I10 Essential (primary) hypertension I50.42 Chronic combined systolic (congestive) and diastolic (congestive) heart failure I48.0 Paroxysmal atrial fibrillation I42.0 Dilated cardiomyopathy Z79.01 Long term (current) use of anticoagulants I25.10 Atherosclerotic heart disease of native coronary artery without angina pectoris N18.32 Chronic kidney disease, stage 3b Facility Procedures CPT4 Code: 15726203 Description: 55974 - WOUND CARE VISIT-LEV 3 EST PT Modifier: Quantity: 1 Electronic Signature(s) Signed: 02/28/2022 12:54:01 PM By: [name], BSN, RN, CWS, Kim RN, BSN Signed: 02/28/2022 1:57:25 PM By: [name] Keeler PA-C Entered By: [name], BSN, RN, CWS, [name] on 02/28/2022 08:07:03

## 2022-02-28 NOTE — Progress Notes (Signed)
[name], [name] ([id number]) Visit Report for 02/28/2022 Arrival Information Details Patient Name: [name], [name]. Date of Service: 02/28/2022 7:30 AM Medical Record Number: [id number] Patient Account Number: [id number] Date of Birth/Sex: 1941-01-21 (81 y.o. M) Treating RN: [name] Primary Care [name]: [name] Other Clinician: Referring [name]: [name] Treating Krista Godsil/Extender: [name] in Treatment: 6 Visit Information History Since Last Visit Added or deleted any medications: No Patient Arrived: Ambulatory Has Dressing in Place as Prescribed: Yes Arrival Time: 08:03 Has Compression in Place as Prescribed: Yes Transfer Assistance: None Pain Present Now: No Patient Identification Verified: Yes Secondary Verification Process Completed: Yes Patient Requires Transmission-Based Precautions: No Patient Has Alerts: No Electronic Signature(s) Signed: 02/28/2022 12:54:01 PM By: [name], BSN, RN, CWS, Kim RN, BSN Entered By: [name], BSN, RN, CWS, [name] on 02/28/2022 08:04:07 [name] ([id number]) -------------------------------------------------------------------------------- Clinic Level of Care Assessment Details Patient Name: [name] Ewing D. Date of Service: 02/28/2022 7:30 AM Medical Record Number: [id number] Patient Account Number: [id number] Date of Birth/Sex: Oct 09, 1940 (81 y.o. M) Treating RN: [name] Primary Care [name]: [name] Other Clinician: Referring [name]: [name] Treating [name]/Extender: [name] in Treatment: 6 Clinic Level of Care Assessment Items TOOL 4 Quantity Score []  - Use when only an EandM is performed on FOLLOW-UP visit 0 ASSESSMENTS - Nursing Assessment / Reassessment X - Reassessment of Co-morbidities (includes updates in patient status) 1 10 X- 1 5 Reassessment of Adherence to Treatment Plan ASSESSMENTS - Wound and Skin Assessment / Reassessment []  - Simple Wound Assessment / Reassessment -  one wound 0 X- 4 5 Complex Wound Assessment / Reassessment - multiple wounds []  - 0 Dermatologic / Skin Assessment (not related to wound area) ASSESSMENTS - Focused Assessment []  - Circumferential Edema Measurements - multi extremities 0 []  - 0 Nutritional Assessment / Counseling / Intervention []  - 0 Lower Extremity Assessment (monofilament, tuning fork, pulses) []  - 0 Peripheral Arterial Disease Assessment (using hand held doppler) ASSESSMENTS - Ostomy and/or Continence Assessment and Care []  - Incontinence Assessment and Management 0 []  - 0 Ostomy Care Assessment and Management (repouching, etc.) PROCESS - Coordination of Care []  - Simple Patient / Family Education for ongoing care 0 []  - 0 Complex (extensive) Patient / Family Education for ongoing care []  - 0 Staff obtains Programmer, systems, Records, Test Results / Process Orders []  - 0 Staff telephones HHA, Nursing Homes / Clarify orders / etc []  - 0 Routine Transfer to another Facility (non-emergent condition) []  - 0 Routine Hospital Admission (non-emergent condition) []  - 0 New Admissions / Biomedical engineer / Ordering NPWT, Apligraf, etc. []  - 0 Emergency Hospital Admission (emergent condition) []  - 0 Simple Discharge Coordination []  - 0 Complex (extensive) Discharge Coordination PROCESS - Special Needs []  - Pediatric / Minor Patient Management 0 []  - 0 Isolation Patient Management []  - 0 Hearing / Language / Visual special needs []  - 0 Assessment of Community assistance (transportation, D/C planning, etc.) []  - 0 Additional assistance / Altered mentation []  - 0 Support Surface(s) Assessment (bed, cushion, seat, etc.) INTERVENTIONS - Wound Cleansing / Measurement [name], [name] ([id number]) []  - 0 Simple Wound Cleansing - one wound X- 4 5 Complex Wound Cleansing - multiple wounds []  - 0 Wound Imaging (photographs - any number of wounds) []  - 0 Wound Tracing (instead of photographs) []  - 0 Simple  Wound Measurement - one wound X- 4 5 Complex Wound Measurement - multiple wounds INTERVENTIONS - Wound Dressings []  - Small Wound Dressing one  or multiple wounds 0 []  - 0 Medium Wound Dressing one or multiple wounds X- 1 20 Large Wound Dressing one or multiple wounds []  - 0 Application of Medications - topical []  - 0 Application of Medications - injection INTERVENTIONS - Miscellaneous []  - External ear exam 0 []  - 0 Specimen Collection (cultures, biopsies, blood, body fluids, etc.) []  - 0 Specimen(s) / Culture(s) sent or taken to Lab for analysis []  - 0 Patient Transfer (multiple staff / Civil Service fast streamer / Similar devices) []  - 0 Simple Staple / Suture removal (25 or less) []  - 0 Complex Staple / Suture removal (26 or more) []  - 0 Hypo / Hyperglycemic Management (close monitor of Blood Glucose) []  - 0 Ankle / Brachial Index (ABI) - do not check if billed separately []  - 0 Vital Signs Has the patient been seen at the hospital within the last three years: Yes Total Score: 95 Level Of Care: New/Established - Level 3 Electronic Signature(s) Signed: 02/28/2022 12:54:01 PM By: [name], BSN, RN, CWS, Kim RN, BSN Entered By: [name], BSN, RN, CWS, [name] on 02/28/2022 08:06:58 [name] ([id number]) -------------------------------------------------------------------------------- Encounter Discharge Information Details Patient Name: [name] Ewing D. Date of Service: 02/28/2022 7:30 AM Medical Record Number: [id number] Patient Account Number: [id number] Date of Birth/Sex: 05-01-1941 (81 y.o. M) Treating RN: [name] Primary Care [name]: [name] Other Clinician: Referring [name]: [name] Treating Griselda Bramblett/Extender: [name] in Treatment: 6 Encounter Discharge Information Items Discharge Condition: Stable Ambulatory Status: Ambulatory Discharge Destination: Home Transportation: Private Auto Schedule Follow-up Appointment: Yes Clinical Summary of  Care: Electronic Signature(s) Signed: 02/28/2022 12:54:01 PM By: [name], BSN, RN, CWS, Kim RN, BSN Entered By: [name], BSN, RN, CWS, [name] on 02/28/2022 08:05:55 [name] ([id number]) -------------------------------------------------------------------------------- Wound Assessment Details Patient Name: [name], [name] Date of Service: 02/28/2022 7:30 AM Medical Record Number: [id number] Patient Account Number: [id number] Date of Birth/Sex: [birth year] (81 y.o. M) Treating RN: [name] Primary Care Stephenia Vogan: [name] Other Clinician: Referring Shamond Skelton: [name] Treating Lucky Trotta/Extender: [name] Weeks in Treatment: 6 Wound Status Wound Number: 2 Primary Etiology: Neuropathic Ulcer-Non Diabetic Wound Location: Right, Plantar Foot Wound Status: Open Wounding Event: Gradually Appeared Date Acquired: 12/28/2020 Weeks Of Treatment: 6 Clustered Wound: No Wound Measurements Length: (cm) 1.2 Width: (cm) 0.7 Depth: (cm) 0.1 Area: (cm) 0.66 Volume: (cm) 0.066 % Reduction in Area: -735.4% % Reduction in Volume: -175% Wound Description Classification: Full Thickness Without Exposed Support Structu Exudate Amount: Medium Exudate Type: Serosanguineous Exudate Color: red, brown res Treatment Notes Wound #2 (Foot) Wound Laterality: Plantar, Right Cleanser Wound Cleanser Discharge Instruction: Wash your hands with soap and water. Remove old dressing, discard into plastic bag and place into trash. Cleanse the wound with Wound Cleanser prior to applying a clean dressing using gauze sponges, not tissues or cotton balls. Do not scrub or use excessive force. Pat dry using gauze sponges, not tissue or cotton balls. Peri-Wound Care Topical Primary Dressing Silvercel Small 2x2 (in/in) Discharge Instruction: Apply Silvercel Small 2x2 (in/in) as instructed Secondary Dressing Zetuvit Plus 4x8 (in/in) Secured With Medipore Tape - 37M Medipore H Soft Cloth Surgical Tape, 2x2  (in/yd) Conform 4'' - Conforming Stretch Gauze Bandage 4x75 (in/in) Discharge Instruction: Apply as directed Tubigrip Size E, 3.5x10 (in/yds) Discharge Instruction: Apply 3 Tubigrip E 3-finger-widths below knee to base of toes to secure dressing and/or for swelling. Compression Wrap Compression Stockings Add-Ons Electronic Signature(s) [name], [name] ([id number]) Signed: 02/28/2022 12:54:01 PM By: [name], BSN, RN, CWS, Kim RN, BSN  Entered By: [name], BSN, RN, CWS, [name] on 02/28/2022 08:04:33 [name] ([id number]) -------------------------------------------------------------------------------- Wound Assessment Details Patient Name: [name], [name] Date of Service: 02/28/2022 7:30 AM Medical Record Number: [id number] Patient Account Number: [id number] Date of Birth/Sex: January 10, 1941 (81 y.o. M) Treating RN: [name] Primary Care Archie Atilano: [name] Other Clinician: Referring Jafar Poffenberger: [name] Treating Charod Slawinski/Extender: [name] Weeks in Treatment: 6 Wound Status Wound Number: 6 Primary Etiology: Venous Leg Ulcer Wound Location: Right, Medial Lower Leg Wound Status: Open Wounding Event: Shear/Friction Date Acquired: 02/20/2022 Weeks Of Treatment: 1 Clustered Wound: No Wound Measurements Length: (cm) 0.9 Width: (cm) 0.6 Depth: (cm) 0.1 Area: (cm) 0.424 Volume: (cm) 0.042 % Reduction in Area: 0% % Reduction in Volume: 0% Wound Description Classification: Full Thickness Without Exposed Support Structu Exudate Amount: Medium Exudate Type: Serosanguineous Exudate Color: red, brown res Treatment Notes Wound #6 (Lower Leg) Wound Laterality: Right, Medial Cleanser Wound Cleanser Discharge Instruction: Wash your hands with soap and water. Remove old dressing, discard into plastic bag and place into trash. Cleanse the wound with Wound Cleanser prior to applying a clean dressing using gauze sponges, not tissues or cotton balls. Do not scrub or use excessive  force. Pat dry using gauze sponges, not tissue or cotton balls. Peri-Wound Care Topical Primary Dressing Silvercel Small 2x2 (in/in) Discharge Instruction: Apply Silvercel Small 2x2 (in/in) as instructed Secondary Dressing Zetuvit Plus 4x8 (in/in) Secured With Medipore Tape - 59M Medipore H Soft Cloth Surgical Tape, 2x2 (in/yd) Conform 4'' - Conforming Stretch Gauze Bandage 4x75 (in/in) Discharge Instruction: Apply as directed Tubigrip Size E, 3.5x10 (in/yds) Discharge Instruction: Apply 3 Tubigrip E 3-finger-widths below knee to base of toes to secure dressing and/or for swelling. Compression Wrap Compression Stockings Add-Ons Electronic Signature(s) ADONTE, VANRIPER ([id number]) Signed: 02/28/2022 12:54:01 PM By: [name], BSN, RN, CWS, Kim RN, BSN Entered By: [name], BSN, RN, CWS, [name] on 02/28/2022 08:04:33 [name] ([id number]) -------------------------------------------------------------------------------- Wound Assessment Details Patient Name: [name] Ewing D. Date of Service: 02/28/2022 7:30 AM Medical Record Number: [id number] Patient Account Number: [id number] Date of Birth/Sex: 1941-05-27 (81 y.o. M) Treating RN: [name] Primary Care Vida Nicol: [name] Other Clinician: Referring Drevin Ortner: [name] Treating Giavonni Fonder/Extender: [name] Weeks in Treatment: 6 Wound Status Wound Number: 7 Primary Etiology: Venous Leg Ulcer Wound Location: Right, Proximal, Lateral Lower Leg Wound Status: Open Wounding Event: Shear/Friction Date Acquired: 02/13/2022 Weeks Of Treatment: 1 Clustered Wound: No Wound Measurements Length: (cm) 3.1 Width: (cm) 3.3 Depth: (cm) 0.1 Area: (cm) 8.035 Volume: (cm) 0.803 % Reduction in Area: 0% % Reduction in Volume: 0% Wound Description Classification: Full Thickness Without Exposed Support Structu Exudate Amount: Medium Exudate Type: Serosanguineous Exudate Color: red, brown res Treatment Notes Wound #7 (Lower Leg)  Wound Laterality: Right, Lateral, Proximal Cleanser Wound Cleanser Discharge Instruction: Wash your hands with soap and water. Remove old dressing, discard into plastic bag and place into trash. Cleanse the wound with Wound Cleanser prior to applying a clean dressing using gauze sponges, not tissues or cotton balls. Do not scrub or use excessive force. Pat dry using gauze sponges, not tissue or cotton balls. Peri-Wound Care Topical Primary Dressing Silvercel Small 2x2 (in/in) Discharge Instruction: Apply Silvercel Small 2x2 (in/in) as instructed Secondary Dressing Zetuvit Plus 4x8 (in/in) Secured With Medipore Tape - 59M Medipore H Soft Cloth Surgical Tape, 2x2 (in/yd) Conform 4'' - Conforming Stretch Gauze Bandage 4x75 (in/in) Discharge Instruction: Apply as directed Tubigrip Size E, 3.5x10 (in/yds) Discharge Instruction: Apply 3 Tubigrip E 3-finger-widths below  knee to base of toes to secure dressing and/or for swelling. Compression Wrap Compression Stockings Add-Ons Electronic Signature(s) LEVII, HAIRFIELD ([id number]) Signed: 02/28/2022 12:54:01 PM By: [name], BSN, RN, CWS, Kim RN, BSN Entered By: [name], BSN, RN, CWS, [name] on 02/28/2022 08:04:33 [name] ([id number]) -------------------------------------------------------------------------------- Wound Assessment Details Patient Name: [name] Ewing D. Date of Service: 02/28/2022 7:30 AM Medical Record Number: [id number] Patient Account Number: [id number] Date of Birth/Sex: [birth year] (81 y.o. M) Treating RN: [name] Primary Care Katilynn Sinkler: [name] Other Clinician: Referring Aaryana Betke: [name] Treating Tomeca Helm/Extender: [name] Weeks in Treatment: 6 Wound Status Wound Number: 8 Primary Etiology: Venous Leg Ulcer Wound Location: Right, Distal, Lateral Lower Leg Wound Status: Open Wounding Event: Shear/Friction Date Acquired: 02/13/2022 Weeks Of Treatment: 1 Clustered Wound: No Wound  Measurements Length: (cm) 4.5 Width: (cm) 2.9 Depth: (cm) 0.1 Area: (cm) 10.249 Volume: (cm) 1.025 % Reduction in Area: 0% % Reduction in Volume: 0% Wound Description Classification: Full Thickness Without Exposed Support Structu Exudate Amount: Medium Exudate Type: Serosanguineous Exudate Color: red, brown res Treatment Notes Wound #8 (Lower Leg) Wound Laterality: Right, Lateral, Distal Cleanser Wound Cleanser Discharge Instruction: Wash your hands with soap and water. Remove old dressing, discard into plastic bag and place into trash. Cleanse the wound with Wound Cleanser prior to applying a clean dressing using gauze sponges, not tissues or cotton balls. Do not scrub or use excessive force. Pat dry using gauze sponges, not tissue or cotton balls. Peri-Wound Care Topical Primary Dressing Silvercel Small 2x2 (in/in) Discharge Instruction: Apply Silvercel Small 2x2 (in/in) as instructed Secondary Dressing Zetuvit Plus 4x8 (in/in) Secured With Medipore Tape - 44M Medipore H Soft Cloth Surgical Tape, 2x2 (in/yd) Conform 4'' - Conforming Stretch Gauze Bandage 4x75 (in/in) Discharge Instruction: Apply as directed Tubigrip Size E, 3.5x10 (in/yds) Discharge Instruction: Apply 3 Tubigrip E 3-finger-widths below knee to base of toes to secure dressing and/or for swelling. Compression Wrap Compression Stockings Add-Ons Electronic Signature(s) THURMAN, SARVER ([id number]) Signed: 02/28/2022 12:54:01 PM By: [name], BSN, RN, CWS, Kim RN, BSN Entered By: [name], BSN, RN, CWS, [name] on 02/28/2022 08:04:33

## 2022-03-06 ENCOUNTER — Encounter: Payer: PPO | Admitting: Physician Assistant

## 2022-03-06 ENCOUNTER — Ambulatory Visit (INDEPENDENT_AMBULATORY_CARE_PROVIDER_SITE_OTHER): Payer: PPO | Admitting: Nurse Practitioner

## 2022-03-06 DIAGNOSIS — G9009 Other idiopathic peripheral autonomic neuropathy: Secondary | ICD-10-CM | POA: Diagnosis not present

## 2022-03-06 DIAGNOSIS — L97911 Non-pressure chronic ulcer of unspecified part of right lower leg limited to breakdown of skin: Secondary | ICD-10-CM | POA: Diagnosis not present

## 2022-03-06 DIAGNOSIS — L97512 Non-pressure chronic ulcer of other part of right foot with fat layer exposed: Secondary | ICD-10-CM | POA: Diagnosis not present

## 2022-03-06 DIAGNOSIS — E11621 Type 2 diabetes mellitus with foot ulcer: Secondary | ICD-10-CM | POA: Diagnosis not present

## 2022-03-06 NOTE — Progress Notes (Addendum)
YOVANNY, COATS ([id number]) Visit Report for 03/06/2022 Chief Complaint Document Details Patient Name: [name], [name]. Date of Service: 03/06/2022 8:15 AM Medical Record Number: [id number] Patient Account Number: [id number] Date of Birth/Sex: 1941/04/23 (81 y.o. M) Treating RN: Cornell Barman Primary Care Provider: [name] Other Clinician: Massie Kluver Referring Provider: [name] Treating Provider/Extender: Skipper Cliche in Treatment: 7 Information Obtained from: Patient Chief Complaint Multiple LE Ulcers Electronic Signature(s) Signed: 03/06/2022 8:30:07 AM By: Worthy Keeler PA-C Entered By: Worthy Keeler on 03/06/2022 08:30:07 Houchen, Dorrell D. ([id number]) -------------------------------------------------------------------------------- Debridement Details Patient Name: [name] Ewing D. Date of Service: 03/06/2022 8:15 AM Medical Record Number: [id number] Patient Account Number: [id number] Date of Birth/Sex: [birth year] (81 y.o. M) Treating RN: Cornell Barman Primary Care Provider: [name] Other Clinician: Massie Kluver Referring Provider: [name] Treating Provider/Extender: Skipper Cliche in Treatment: 7 Debridement Performed for Wound #2 Right,Plantar Foot Assessment: Performed By: Physician Tommie Sams., PA-C Debridement Type: Debridement Level of Consciousness (Pre- Awake and Alert procedure): Pre-procedure Verification/Time Out Yes - 08:59 Taken: Start Time: 08:59 Total Area Debrided (L x W): 0.8 (cm) x 0.3 (cm) = 0.24 (cm) Tissue and other material Viable, Non-Viable, Callus, Slough, Subcutaneous, Slough debrided: Level: Skin/Subcutaneous Tissue Debridement Description: Excisional Instrument: Curette Bleeding: Minimum Hemostasis Achieved: Pressure End Time: 09:02 Response to Treatment: Procedure was tolerated well Level of Consciousness (Post- Awake and Alert procedure): Post Debridement Measurements of Total Wound Length:  (cm) 0.8 Width: (cm) 0.3 Depth: (cm) 0.3 Volume: (cm) 0.057 Character of Wound/Ulcer Post Debridement: Stable Post Procedure Diagnosis Same as Pre-procedure Electronic Signature(s) Signed: 03/06/2022 9:40:40 AM By: Gretta Cool, BSN, RN, CWS, Kim RN, BSN Signed: 03/06/2022 5:14:26 PM By: Massie Kluver Signed: 03/06/2022 5:41:38 PM By: Worthy Keeler PA-C Entered By: Massie Kluver on 03/06/2022 09:03:04 [name], [name] ([id number]) -------------------------------------------------------------------------------- HPI Details Patient Name: [name] Ewing D. Date of Service: 03/06/2022 8:15 AM Medical Record Number: [id number] Patient Account Number: [id number] Date of Birth/Sex: Oct 23, 1940 (81 y.o. M) Treating RN: Cornell Barman Primary Care Provider: [name] Other Clinician: Massie Kluver Referring Provider: [name] Treating Provider/Extender: Skipper Cliche in Treatment: 7 History of Present Illness HPI Description: 01-16-2022 upon evaluation today patient presents for initial inspection here in the clinic concerning issues that he has been having with wounds over the bilateral lower extremities and bilateral feet. Fortunately there does not appear to be any signs of significant infection at this point which is great news. Unfortunately his ABIs were registering at 0.49 on the left and 0.57 on the right here in the clinic on the screening today. With that being said I do think that it may possibly be true that we need to get him to formal arterial studies in order to see how things really are showing up. And the patient's not in disagreement with this for that reason we are going to make that referral as well. With that being said he does have some significant past medical history items of note detailed below. Patient does have chronic kidney disease stage IIIb, coronary artery disease, long-term use of anticoagulant therapy, dilated cardiomyopathy. Proximal atrial fibrillation, he is  on Xarelto long-term. He also has congestive heart failure, hypertension, peripheral neuropathy not related to diabetes as he is only prediabetic with a hemoglobin A1c of 5.8, and chronic venous hypertension. He has recently seen Dr. [name] show his cardiologist who did discontinue the hydrochlorothiazide and the Lasix and felt that the patient was retaining more fluid than he should be.  For that reason he was started on furosemide 20 mg twice daily. 7/27; patient with bilateral lower extremity wounds. These are largely venous although he had very poor ABIs on screening test here. He has an appointment apparently tomorrow at vein and vascular for arterial studies. He was not too keen on the latter although I think I emphasized the need to do this both in terms of dealing with the current wounds and being prepared for further issues down the road. We have been using silver alginate on the wounds and Tubigrip's edema control is better per our intake nurse 02-06-2022 upon evaluation today patient appears to be doing well with regard to his wound. Fortunately there is no signs of infection he is actually healing for the most part on the bilateral feet. His legs are also doing much better though he has 1 new area that popped up on the right leg. Fortunately there is no evidence of active infection of note his ABIs and TBI's were poor on the vascular screening on the 31st with the left being worse than the right. For that reason I am going to recommend he still continue to follow-up with vascular to discuss angioplasty/stenting. [id number] upon evaluation today patient appears to be doing well currently in regard to his wound on the leg although he has several other blisters that are showing up at this point. I do believe that we need to try to do something to get his swelling under control this could be of utmost importance as far as getting this healed is concerned. 03-06-2022 upon evaluation today patient  appears to be doing well currently in regard to his wounds. In fact the legs are drying up and seem to be healing quite nicely the lateral wounds are still weeping a little bit about the medial side actually appears to be pretty much dry and closed. The plantar aspect of his foot still is draining a bit but again it seems to be doing much better which is great news. Electronic Signature(s) Signed: 03/06/2022 9:23:48 AM By: Worthy Keeler PA-C Entered By: Worthy Keeler on 03/06/2022 09:23:48 Fletchall, [name] ([id number]) -------------------------------------------------------------------------------- Physical Exam Details Patient Name: [name] Ewing D. Date of Service: 03/06/2022 8:15 AM Medical Record Number: [id number] Patient Account Number: [id number] Date of Birth/Sex: 12/05/1940 (81 y.o. M) Treating RN: Cornell Barman Primary Care Provider: [name] Other Clinician: Massie Kluver Referring Provider: [name] Treating Provider/Extender: [name] Weeks in Treatment: 7 Constitutional Well-nourished and well-hydrated in no acute distress. Respiratory normal breathing without difficulty. Psychiatric this patient is able to make decisions and demonstrates good insight into disease process. Alert and Oriented x 3. pleasant and cooperative. Notes Upon inspection patient's wound bed actually showed signs of good granulation and epithelization pretty much across the board. I did perform a little bit of debridement to remove callus as well as slough and biofilm down to good subcutaneous tissue on the plantar aspect of the foot he tolerated this without complication. Electronic Signature(s) Signed: 03/06/2022 9:24:06 AM By: Worthy Keeler PA-C Entered By: Worthy Keeler on 03/06/2022 09:24:05 Retana, [name] ([id number]) -------------------------------------------------------------------------------- Physician Orders Details Patient Name: [name] Ewing D. Date of Service:  03/06/2022 8:15 AM Medical Record Number: [id number] Patient Account Number: [id number] Date of Birth/Sex: 1940-12-19 (81 y.o. M) Treating RN: Cornell Barman Primary Care Provider: [name] Other Clinician: Massie Kluver Referring Provider: [name] Treating Provider/Extender: Skipper Cliche in Treatment: 7 Verbal / Phone Orders: No Diagnosis Coding ICD-10 Coding Code  Description I87.333 Chronic venous hypertension (idiopathic) with ulcer and inflammation of bilateral lower extremity L97.812 Non-pressure chronic ulcer of other part of right lower leg with fat layer exposed L97.822 Non-pressure chronic ulcer of other part of left lower leg with fat layer exposed G90.09 Other idiopathic peripheral autonomic neuropathy L97.522 Non-pressure chronic ulcer of other part of left foot with fat layer exposed L97.512 Non-pressure chronic ulcer of other part of right foot with fat layer exposed I10 Essential (primary) hypertension I50.42 Chronic combined systolic (congestive) and diastolic (congestive) heart failure I48.0 Paroxysmal atrial fibrillation I42.0 Dilated cardiomyopathy Z79.01 Long term (current) use of anticoagulants I25.10 Atherosclerotic heart disease of native coronary artery without angina pectoris N18.32 Chronic kidney disease, stage 3b Follow-up Appointments o Return Appointment in 1 week. o Nurse Visit as needed Bathing/ Shower/ Hygiene o May shower with wound dressing protected with water repellent cover or cast protector. Anesthetic (Use 'Patient Medications' Section for Anesthetic Order Entry) o Lidocaine applied to wound bed Edema Control - Lymphedema / Segmental Compressive Device / Other o Tubigrip double layer applied - tubi E double layer o Elevate, Exercise Daily and Avoid Standing for Long Periods of Time. o Elevate legs to the level of the heart and pump ankles as often as possible o Elevate leg(s) parallel to the floor when sitting. Wound  Treatment Wound #2 - Foot Wound Laterality: Plantar, Right Cleanser: Wound Cleanser 3 x Per Week/30 Days Discharge Instructions: Wash your hands with soap and water. Remove old dressing, discard into plastic bag and place into trash. Cleanse the wound with Wound Cleanser prior to applying a clean dressing using gauze sponges, not tissues or cotton balls. Do not scrub or use excessive force. Pat dry using gauze sponges, not tissue or cotton balls. Primary Dressing: Silvercel Small 2x2 (in/in) 3 x Per Week/30 Days Discharge Instructions: Apply Silvercel Small 2x2 (in/in) as instructed Secondary Dressing: Zetuvit Plus 4x8 (in/in) 3 x Per Week/30 Days Secured With: Medipore Tape - 22M Medipore H Soft Cloth Surgical Tape, 2x2 (in/yd) 3 x Per Week/30 Days Secured With: Conform 4'' - Conforming Stretch Gauze Bandage 4x75 (in/in) 3 x Per Week/30 Days Discharge Instructions: Apply as directed Secured With: Tubigrip Size E, 3.5x10 (in/yds) 3 x Per Week/30 Days Discharge Instructions: Apply 3 Tubigrip E 3-finger-widths below knee to base of toes to secure dressing and/or for swelling. NOCHUM, FENTER ([id number]) Wound #6 - Lower Leg Wound Laterality: Right, Medial Cleanser: Wound Cleanser 3 x Per Week/30 Days Discharge Instructions: Wash your hands with soap and water. Remove old dressing, discard into plastic bag and place into trash. Cleanse the wound with Wound Cleanser prior to applying a clean dressing using gauze sponges, not tissues or cotton balls. Do not scrub or use excessive force. Pat dry using gauze sponges, not tissue or cotton balls. Primary Dressing: Zetuvit Plus 4x8 (in/in) 3 x Per Week/30 Days Secured With: Medipore Tape - 22M Medipore H Soft Cloth Surgical Tape, 2x2 (in/yd) 3 x Per Week/30 Days Secured With: Conform 4'' - Conforming Stretch Gauze Bandage 4x75 (in/in) 3 x Per Week/30 Days Discharge Instructions: Apply as directed Secured With: Tubigrip Size E, 3.5x10 (in/yds) 3 x Per  Week/30 Days Discharge Instructions: Apply 3 Tubigrip E 3-finger-widths below knee to base of toes to secure dressing and/or for swelling. Wound #7 - Lower Leg Wound Laterality: Right, Lateral, Proximal Cleanser: Wound Cleanser 3 x Per Week/30 Days Discharge Instructions: Wash your hands with soap and water. Remove old dressing, discard into plastic bag and place  into trash. Cleanse the wound with Wound Cleanser prior to applying a clean dressing using gauze sponges, not tissues or cotton balls. Do not scrub or use excessive force. Pat dry using gauze sponges, not tissue or cotton balls. Primary Dressing: Zetuvit Plus 4x8 (in/in) 3 x Per Week/30 Days Secured With: Medipore Tape - 66M Medipore H Soft Cloth Surgical Tape, 2x2 (in/yd) 3 x Per Week/30 Days Secured With: Conform 4'' - Conforming Stretch Gauze Bandage 4x75 (in/in) 3 x Per Week/30 Days Discharge Instructions: Apply as directed Secured With: Tubigrip Size E, 3.5x10 (in/yds) 3 x Per Week/30 Days Discharge Instructions: Apply 3 Tubigrip E 3-finger-widths below knee to base of toes to secure dressing and/or for swelling. Wound #8 - Lower Leg Wound Laterality: Right, Lateral, Distal Cleanser: Wound Cleanser 3 x Per Week/30 Days Discharge Instructions: Wash your hands with soap and water. Remove old dressing, discard into plastic bag and place into trash. Cleanse the wound with Wound Cleanser prior to applying a clean dressing using gauze sponges, not tissues or cotton balls. Do not scrub or use excessive force. Pat dry using gauze sponges, not tissue or cotton balls. Primary Dressing: Zetuvit Plus 4x8 (in/in) 3 x Per Week/30 Days Secured With: Medipore Tape - 66M Medipore H Soft Cloth Surgical Tape, 2x2 (in/yd) 3 x Per Week/30 Days Secured With: Conform 4'' - Conforming Stretch Gauze Bandage 4x75 (in/in) 3 x Per Week/30 Days Discharge Instructions: Apply as directed Secured With: Tubigrip Size E, 3.5x10 (in/yds) 3 x Per Week/30  Days Discharge Instructions: Apply 3 Tubigrip E 3-finger-widths below knee to base of toes to secure dressing and/or for swelling. Electronic Signature(s) Signed: 03/06/2022 5:14:26 PM By: Massie Kluver Signed: 03/06/2022 5:41:38 PM By: Worthy Keeler PA-C Entered By: Massie Kluver on 03/06/2022 09:06:49 [name], [name] ([id number]) -------------------------------------------------------------------------------- Problem List Details Patient Name: [name] Ewing D. Date of Service: 03/06/2022 8:15 AM Medical Record Number: [id number] Patient Account Number: [id number] Date of Birth/Sex: 1941-06-18 (81 y.o. M) Treating RN: Cornell Barman Primary Care Provider: [name] Other Clinician: Massie Kluver Referring Provider: [name] Treating Provider/Extender: Skipper Cliche in Treatment: 7 Active Problems ICD-10 Encounter Code Description Active Date MDM Diagnosis I87.333 Chronic venous hypertension (idiopathic) with ulcer and inflammation of 01/16/2022 No Yes bilateral lower extremity L97.812 Non-pressure chronic ulcer of other part of right lower leg with fat layer 01/16/2022 No Yes exposed L97.822 Non-pressure chronic ulcer of other part of left lower leg with fat layer 01/16/2022 No Yes exposed G90.09 Other idiopathic peripheral autonomic neuropathy 01/16/2022 No Yes L97.522 Non-pressure chronic ulcer of other part of left foot with fat layer 01/16/2022 No Yes exposed L97.512 Non-pressure chronic ulcer of other part of right foot with fat layer 01/16/2022 No Yes exposed [place] (primary) hypertension 01/16/2022 No Yes I50.42 Chronic combined systolic (congestive) and diastolic (congestive) heart 01/16/2022 No Yes failure I48.0 Paroxysmal atrial fibrillation 01/16/2022 No Yes I42.0 Dilated cardiomyopathy 01/16/2022 No Yes Z79.01 Long term (current) use of anticoagulants 01/16/2022 No Yes I25.10 Atherosclerotic heart disease of native coronary artery without angina 01/16/2022 No  Yes pectoris [name], [name] ([id number]) N18.32 Chronic kidney disease, stage 3b 01/16/2022 No Yes Inactive Problems Resolved Problems Electronic Signature(s) Signed: 03/06/2022 8:30:03 AM By: Worthy Keeler PA-C Entered By: Worthy Keeler on 03/06/2022 08:30:03 [name], [name] ([id number]) -------------------------------------------------------------------------------- Progress Note Details Patient Name: [name] Ewing D. Date of Service: 03/06/2022 8:15 AM Medical Record Number: [id number] Patient Account Number: [id number] Date of Birth/Sex: 01/26/1941 (81 y.o. M) Treating RN: Cornell Barman  Primary Care Provider: [name] Other Clinician: Massie Kluver Referring Provider: [name] Treating Provider/Extender: Skipper Cliche in Treatment: 7 Subjective Chief Complaint Information obtained from Patient Multiple LE Ulcers History of Present Illness (HPI) 01-16-2022 upon evaluation today patient presents for initial inspection here in the clinic concerning issues that he has been having with wounds over the bilateral lower extremities and bilateral feet. Fortunately there does not appear to be any signs of significant infection at this point which is great news. Unfortunately his ABIs were registering at 0.49 on the left and 0.57 on the right here in the clinic on the screening today. With that being said I do think that it may possibly be true that we need to get him to formal arterial studies in order to see how things really are showing up. And the patient's not in disagreement with this for that reason we are going to make that referral as well. With that being said he does have some significant past medical history items of note detailed below. Patient does have chronic kidney disease stage IIIb, coronary artery disease, long-term use of anticoagulant therapy, dilated cardiomyopathy. Proximal atrial fibrillation, he is on Xarelto long-term. He also has congestive heart  failure, hypertension, peripheral neuropathy not related to diabetes as he is only prediabetic with a hemoglobin A1c of 5.8, and chronic venous hypertension. He has recently seen Dr. [name] show his cardiologist who did discontinue the hydrochlorothiazide and the Lasix and felt that the patient was retaining more fluid than he should be. For that reason he was started on furosemide 20 mg twice daily. 7/27; patient with bilateral lower extremity wounds. These are largely venous although he had very poor ABIs on screening test here. He has an appointment apparently tomorrow at vein and vascular for arterial studies. He was not too keen on the latter although I think I emphasized the need to do this both in terms of dealing with the current wounds and being prepared for further issues down the road. We have been using silver alginate on the wounds and Tubigrip's edema control is better per our intake nurse 02-06-2022 upon evaluation today patient appears to be doing well with regard to his wound. Fortunately there is no signs of infection he is actually healing for the most part on the bilateral feet. His legs are also doing much better though he has 1 new area that popped up on the right leg. Fortunately there is no evidence of active infection of note his ABIs and TBI's were poor on the vascular screening on the 31st with the left being worse than the right. For that reason I am going to recommend he still continue to follow-up with vascular to discuss angioplasty/stenting. 8-124-23 upon evaluation today patient appears to be doing well currently in regard to his wound on the leg although he has several other blisters that are showing up at this point. I do believe that we need to try to do something to get his swelling under control this could be of utmost importance as far as getting this healed is concerned. 03-06-2022 upon evaluation today patient appears to be doing well currently in regard to his  wounds. In fact the legs are drying up and seem to be healing quite nicely the lateral wounds are still weeping a little bit about the medial side actually appears to be pretty much dry and closed. The plantar aspect of his foot still is draining a bit but again it seems to be  doing much better which is great news. Objective Constitutional Well-nourished and well-hydrated in no acute distress. Vitals Time Taken: 8:17 AM, Height: 74 in, Weight: 244 lbs, BMI: 31.3, Temperature: 98.3 F, Pulse: 69 bpm, Respiratory Rate: 18 breaths/min, Blood Pressure: 137/65 mmHg. Respiratory normal breathing without difficulty. Psychiatric this patient is able to make decisions and demonstrates good insight into disease process. Alert and Oriented x 3. pleasant and cooperative. General Notes: Upon inspection patient's wound bed actually showed signs of good granulation and epithelization pretty much across the board. I did perform a little bit of debridement to remove callus as well as slough and biofilm down to good subcutaneous tissue on the plantar aspect of [name], [name] ([id number]) the foot he tolerated this without complication. Integumentary (Hair, Skin) Wound #2 status is Open. Original cause of wound was Gradually Appeared. The date acquired was: 12/28/2020. The wound has been in treatment 7 weeks. The wound is located on the [place]. The wound measures 0.8cm length x 0.3cm width x 0.2cm depth; 0.188cm^2 area and 0.038cm^3 volume. There is a medium amount of serosanguineous drainage noted. There is small (1-33%) red, pink granulation within the wound bed. Wound #6 status is Open. Original cause of wound was Shear/Friction. The date acquired was: 02/20/2022. The wound has been in treatment 2 weeks. The wound is located on the Right,Medial Lower Leg. The wound measures 1.7cm length x 1.5cm width x 0.1cm depth; 2.003cm^2 area and 0.2cm^3 volume. There is a medium amount of serosanguineous  drainage noted. There is small (1-33%) red granulation within the wound bed. Wound #7 status is Open. Original cause of wound was Shear/Friction. The date acquired was: 02/13/2022. The wound has been in treatment 2 weeks. The wound is located on the Right,Proximal,Lateral Lower Leg. The wound measures 1cm length x 2.5cm width x 0.1cm depth; 1.963cm^2 area and 0.196cm^3 volume. There is Fat Layer (Subcutaneous Tissue) exposed. There is a medium amount of serosanguineous drainage noted. There is small (1-33%) red granulation within the wound bed. Wound #8 status is Open. Original cause of wound was Shear/Friction. The date acquired was: 02/13/2022. The wound has been in treatment 2 weeks. The wound is located on the Right,Distal,Lateral Lower Leg. The wound measures 6cm length x 1.7cm width x 0.1cm depth; 8.011cm^2 area and 0.801cm^3 volume. There is Fat Layer (Subcutaneous Tissue) exposed. There is a medium amount of serosanguineous drainage noted. There is small (1-33%) red granulation within the wound bed. Assessment Active Problems ICD-10 Chronic venous hypertension (idiopathic) with ulcer and inflammation of bilateral lower extremity Non-pressure chronic ulcer of other part of right lower leg with fat layer exposed Non-pressure chronic ulcer of other part of left lower leg with fat layer exposed Other idiopathic peripheral autonomic neuropathy Non-pressure chronic ulcer of other part of left foot with fat layer exposed Non-pressure chronic ulcer of other part of right foot with fat layer exposed Essential (primary) hypertension Chronic combined systolic (congestive) and diastolic (congestive) heart failure Paroxysmal atrial fibrillation Dilated cardiomyopathy Long term (current) use of anticoagulants Atherosclerotic heart disease of native coronary artery without angina pectoris Chronic kidney disease, stage 3b Procedures Wound #2 Pre-procedure diagnosis of Wound #2 is a Neuropathic  Ulcer-Non Diabetic located on the [place] . There was a Excisional Skin/Subcutaneous Tissue Debridement with a total area of 0.24 sq cm performed by Tommie Sams., PA-C. With the following instrument(s): Curette to remove Viable and Non-Viable tissue/material. Material removed includes Callus, Subcutaneous Tissue, and Slough. A time out was conducted at 08:59,  prior to the start of the procedure. A Minimum amount of bleeding was controlled with Pressure. The procedure was tolerated well. Post Debridement Measurements: 0.8cm length x 0.3cm width x 0.3cm depth; 0.057cm^3 volume. Character of Wound/Ulcer Post Debridement is stable. Post procedure Diagnosis Wound #2: Same as Pre-Procedure Plan Follow-up Appointments: Return Appointment in 1 week. Nurse Visit as needed Bathing/ Shower/ Hygiene: May shower with wound dressing protected with water repellent cover or cast protector. Anesthetic (Use 'Patient Medications' Section for Anesthetic Order Entry): Lidocaine applied to wound bed [name], [name] ([id number]) Edema Control - Lymphedema / Segmental Compressive Device / Other: Tubigrip double layer applied - tubi E double layer Elevate, Exercise Daily and Avoid Standing for Long Periods of Time. Elevate legs to the level of the heart and pump ankles as often as possible Elevate leg(s) parallel to the floor when sitting. WOUND #2: - Foot Wound Laterality: Plantar, Right Cleanser: Wound Cleanser 3 x Per Week/30 Days Discharge Instructions: Wash your hands with soap and water. Remove old dressing, discard into plastic bag and place into trash. Cleanse the wound with Wound Cleanser prior to applying a clean dressing using gauze sponges, not tissues or cotton balls. Do not scrub or use excessive force. Pat dry using gauze sponges, not tissue or cotton balls. Primary Dressing: Silvercel Small 2x2 (in/in) 3 x Per Week/30 Days Discharge Instructions: Apply Silvercel Small 2x2 (in/in) as  instructed Secondary Dressing: Zetuvit Plus 4x8 (in/in) 3 x Per Week/30 Days Secured With: Medipore Tape - 346M Medipore H Soft Cloth Surgical Tape, 2x2 (in/yd) 3 x Per Week/30 Days Secured With: Conform 4'' - Conforming Stretch Gauze Bandage 4x75 (in/in) 3 x Per Week/30 Days Discharge Instructions: Apply as directed Secured With: Tubigrip Size E, 3.5x10 (in/yds) 3 x Per Week/30 Days Discharge Instructions: Apply 3 Tubigrip E 3-finger-widths below knee to base of toes to secure dressing and/or for swelling. WOUND #6: - Lower Leg Wound Laterality: Right, Medial Cleanser: Wound Cleanser 3 x Per Week/30 Days Discharge Instructions: Wash your hands with soap and water. Remove old dressing, discard into plastic bag and place into trash. Cleanse the wound with Wound Cleanser prior to applying a clean dressing using gauze sponges, not tissues or cotton balls. Do not scrub or use excessive force. Pat dry using gauze sponges, not tissue or cotton balls. Primary Dressing: Zetuvit Plus 4x8 (in/in) 3 x Per Week/30 Days Secured With: Medipore Tape - 346M Medipore H Soft Cloth Surgical Tape, 2x2 (in/yd) 3 x Per Week/30 Days Secured With: Conform 4'' - Conforming Stretch Gauze Bandage 4x75 (in/in) 3 x Per Week/30 Days Discharge Instructions: Apply as directed Secured With: Tubigrip Size E, 3.5x10 (in/yds) 3 x Per Week/30 Days Discharge Instructions: Apply 3 Tubigrip E 3-finger-widths below knee to base of toes to secure dressing and/or for swelling. WOUND #7: - Lower Leg Wound Laterality: Right, Lateral, Proximal Cleanser: Wound Cleanser 3 x Per Week/30 Days Discharge Instructions: Wash your hands with soap and water. Remove old dressing, discard into plastic bag and place into trash. Cleanse the wound with Wound Cleanser prior to applying a clean dressing using gauze sponges, not tissues or cotton balls. Do not scrub or use excessive force. Pat dry using gauze sponges, not tissue or cotton balls. Primary  Dressing: Zetuvit Plus 4x8 (in/in) 3 x Per Week/30 Days Secured With: Medipore Tape - 346M Medipore H Soft Cloth Surgical Tape, 2x2 (in/yd) 3 x Per Week/30 Days Secured With: Conform 4'' - Conforming Stretch Gauze Bandage 4x75 (in/in) 3 x Per  Week/30 Days Discharge Instructions: Apply as directed Secured With: Tubigrip Size E, 3.5x10 (in/yds) 3 x Per Week/30 Days Discharge Instructions: Apply 3 Tubigrip E 3-finger-widths below knee to base of toes to secure dressing and/or for swelling. WOUND #8: - Lower Leg Wound Laterality: Right, Lateral, Distal Cleanser: Wound Cleanser 3 x Per Week/30 Days Discharge Instructions: Wash your hands with soap and water. Remove old dressing, discard into plastic bag and place into trash. Cleanse the wound with Wound Cleanser prior to applying a clean dressing using gauze sponges, not tissues or cotton balls. Do not scrub or use excessive force. Pat dry using gauze sponges, not tissue or cotton balls. Primary Dressing: Zetuvit Plus 4x8 (in/in) 3 x Per Week/30 Days Secured With: Medipore Tape - 85M Medipore H Soft Cloth Surgical Tape, 2x2 (in/yd) 3 x Per Week/30 Days Secured With: Conform 4'' - Conforming Stretch Gauze Bandage 4x75 (in/in) 3 x Per Week/30 Days Discharge Instructions: Apply as directed Secured With: Tubigrip Size E, 3.5x10 (in/yds) 3 x Per Week/30 Days Discharge Instructions: Apply 3 Tubigrip E 3-finger-widths below knee to base of toes to secure dressing and/or for swelling. 1. I would recommend currently that we going to continue with the wound care measures as before and the patient is in agreement with plan. This includes the use of the silver cell to the plantar aspect of his foot followed by Zetuvit to cover and roll gauze to hold in place. 2. Were discontinued Zetuvit over the legs so this will not hopefully stick but will still catch any drainage that may occur and protect the area. 3. We will continue with the Tubigrip which I think is doing  awesome. We will see patient back for reevaluation in 1 week here in the clinic. If anything worsens or changes patient will contact our office for additional recommendations. Electronic Signature(s) Signed: 03/06/2022 9:24:38 AM By: Worthy Keeler PA-C Entered By: Worthy Keeler on 03/06/2022 09:24:37 [name], [name] ([id number]) -------------------------------------------------------------------------------- SuperBill Details Patient Name: [name] Ewing D. Date of Service: 03/06/2022 Medical Record Number: [id number] Patient Account Number: [id number] Date of Birth/Sex: 10-May-1941 (81 y.o. M) Treating RN: Cornell Barman Primary Care Provider: [name] Other Clinician: Massie Kluver Referring Provider: [name] Treating Provider/Extender: Skipper Cliche in Treatment: 7 Diagnosis Coding ICD-10 Codes Code Description [phone] Chronic venous hypertension (idiopathic) with ulcer and inflammation of bilateral lower extremity L97.812 Non-pressure chronic ulcer of other part of right lower leg with fat layer exposed L97.822 Non-pressure chronic ulcer of other part of left lower leg with fat layer exposed G90.09 Other idiopathic peripheral autonomic neuropathy L97.522 Non-pressure chronic ulcer of other part of left foot with fat layer exposed L97.512 Non-pressure chronic ulcer of other part of right foot with fat layer exposed I10 Essential (primary) hypertension I50.42 Chronic combined systolic (congestive) and diastolic (congestive) heart failure I48.0 Paroxysmal atrial fibrillation I42.0 Dilated cardiomyopathy Z79.01 Long term (current) use of anticoagulants I25.10 Atherosclerotic heart disease of native coronary artery without angina pectoris N18.32 Chronic kidney disease, stage 3b Facility Procedures CPT4 Code: [id number] Description: 11042 - DEB SUBQ TISSUE 20 SQ CM/< Modifier: Quantity: 1 CPT4 Code: Description: ICD-10 Diagnosis Description L97.512 Non-pressure chronic  ulcer of other part of right foot with fat layer ex Modifier: posed Quantity: Physician Procedures CPT4 Code: [id number] Description: 11042 - WC PHYS SUBQ TISS 20 SQ CM Modifier: Quantity: 1 CPT4 Code: Description: ICD-10 Diagnosis Description L97.512 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

## 2022-03-07 NOTE — Progress Notes (Signed)
CALVEN, GILKES ([id number]) Visit Report for 03/06/2022 Arrival Information Details Patient Name: [name], [name]. Date of Service: 03/06/2022 8:15 AM Medical Record Number: [id number] Patient Account Number: [id number] Date of Birth/Sex: 08/31/1940 (81 y.o. M) Treating RN: Cornell Barman Primary Care Ezabella Teska: [name] Other Clinician: Massie Kluver Referring Rakeya Glab: [name] Treating Jancy Sprankle/Extender: Skipper Cliche in Treatment: 7 Visit Information History Since Last Visit All ordered tests and consults were completed: No Patient Arrived: [name] Added or deleted any medications: No Arrival Time: 08:13 Any new allergies or adverse reactions: No Transfer Assistance: None Had a fall or experienced change in No Patient Requires Transmission-Based Precautions: No activities of daily living that may affect Patient Has Alerts: No risk of falls: Hospitalized since last visit: No Pain Present Now: No Electronic Signature(s) Signed: 03/06/2022 5:14:26 PM By: Massie Kluver Entered By: Massie Kluver on 03/06/2022 08:17:16 Verdi, Wayde D. ([id number]) -------------------------------------------------------------------------------- Clinic Level of Care Assessment Details Patient Name: [name] Ewing D. Date of Service: 03/06/2022 8:15 AM Medical Record Number: [id number] Patient Account Number: [id number] Date of Birth/Sex: 02/21/1941 (81 y.o. M) Treating RN: Cornell Barman Primary Care Megann Easterwood: [name] Other Clinician: Massie Kluver Referring Aloysuis Ribaudo: [name] Treating Makyle Eslick/Extender: Skipper Cliche in Treatment: 7 Clinic Level of Care Assessment Items TOOL 1 Quantity Score []  - Use when EandM and Procedure is performed on INITIAL visit 0 ASSESSMENTS - Nursing Assessment / Reassessment []  - General Physical Exam (combine w/ comprehensive assessment (listed just below) when performed on new 0 pt. evals) []  - 0 Comprehensive Assessment (HX, ROS, Risk  Assessments, Wounds Hx, etc.) ASSESSMENTS - Wound and Skin Assessment / Reassessment []  - Dermatologic / Skin Assessment (not related to wound area) 0 ASSESSMENTS - Ostomy and/or Continence Assessment and Care []  - Incontinence Assessment and Management 0 []  - 0 Ostomy Care Assessment and Management (repouching, etc.) PROCESS - Coordination of Care []  - Simple Patient / Family Education for ongoing care 0 []  - 0 Complex (extensive) Patient / Family Education for ongoing care []  - 0 Staff obtains Programmer, systems, Records, Test Results / Process Orders []  - 0 Staff telephones HHA, Nursing Homes / Clarify orders / etc []  - 0 Routine Transfer to another Facility (non-emergent condition) []  - 0 Routine Hospital Admission (non-emergent condition) []  - 0 New Admissions / Biomedical engineer / Ordering NPWT, Apligraf, etc. []  - 0 Emergency Hospital Admission (emergent condition) PROCESS - Special Needs []  - Pediatric / Minor Patient Management 0 []  - 0 Isolation Patient Management []  - 0 Hearing / Language / Visual special needs []  - 0 Assessment of Community assistance (transportation, D/C planning, etc.) []  - 0 Additional assistance / Altered mentation []  - 0 Support Surface(s) Assessment (bed, cushion, seat, etc.) INTERVENTIONS - Miscellaneous []  - External ear exam 0 []  - 0 Patient Transfer (multiple staff / Civil Service fast streamer / Similar devices) []  - 0 Simple Staple / Suture removal (25 or less) []  - 0 Complex Staple / Suture removal (26 or more) []  - 0 Hypo/Hyperglycemic Management (do not check if billed separately) []  - 0 Ankle / Brachial Index (ABI) - do not check if billed separately Has the patient been seen at the hospital within the last three years: Yes Total Score: 0 Level Of Care: ____ [name] ([id number]) Electronic Signature(s) Signed: 03/06/2022 5:14:26 PM By: Massie Kluver Entered By: Massie Kluver on 03/06/2022 09:07:10 Komperda, Ildefonso D.  ([id number]) -------------------------------------------------------------------------------- Lower Extremity Assessment Details Patient Name: [name] Ewing D. Date of Service: 03/06/2022 8:15 AM Medical  Record Number: [id number] Patient Account Number: [id number] Date of Birth/Sex: 1941/01/15 (81 y.o. M) Treating RN: Cornell Barman Primary Care Herrick Hartog: [name] Other Clinician: Massie Kluver Referring Filemon Breton: [name] Treating Jodette Wik/Extender: [name] Weeks in Treatment: 7 Edema Assessment Assessed: [Left: No] [Right: Yes] Edema: [Left: Ye] [Right: s] Calf Left: Right: Point of Measurement: 38 cm From Medial Instep 39.5 cm Ankle Left: Right: Point of Measurement: 15 cm From Medial Instep 28.6 cm Vascular Assessment Pulses: Dorsalis Pedis Palpable: [Right:Yes] Electronic Signature(s) Signed: 03/06/2022 9:40:40 AM By: Gretta Cool, BSN, RN, CWS, Kim RN, BSN Signed: 03/06/2022 5:14:26 PM By: Massie Kluver Entered By: Massie Kluver on 03/06/2022 08:36:17 Borges, Daemyn D. ([id number]) -------------------------------------------------------------------------------- Multi Wound Chart Details Patient Name: [name] Ewing D. Date of Service: 03/06/2022 8:15 AM Medical Record Number: [id number] Patient Account Number: [id number] Date of Birth/Sex: [birth year] (81 y.o. M) Treating RN: Cornell Barman Primary Care Tarrence Enck: [name] Other Clinician: Massie Kluver Referring Artice Bergerson: [name] Treating Kenedi Cilia/Extender: [name] Weeks in Treatment: 7 Vital Signs Height(in): 74 Pulse(bpm): 51 Weight(lbs): 244 Blood Pressure(mmHg): 137/65 Body Mass Index(BMI): 31.3 Temperature(F): 98.3 Respiratory Rate(breaths/min): 18 Photos: Wound Location: Right, Plantar Foot Right, Medial Lower Leg Right, Proximal, Lateral Lower Leg Wounding Event: Gradually Appeared Shear/Friction Shear/Friction Primary Etiology: Neuropathic Ulcer-Non Diabetic Venous Leg Ulcer Venous Leg  Ulcer Comorbid History: Congestive Heart Failure, Congestive Heart Failure, Congestive Heart Failure, Hypertension Hypertension Hypertension Date Acquired: 12/28/2020 02/20/2022 02/13/2022 Weeks of Treatment: 7 2 2  Wound Status: Open Open Open Wound Recurrence: No No No Measurements L x W x D (cm) 0.8x0.3x0.2 1.7x1.5x0.1 1x2.5x0.1 Area (cm) : 0.188 2.003 1.963 Volume (cm) : 0.038 0.2 0.196 % Reduction in Area: -138.00% -372.40% 75.60% % Reduction in Volume: -58.30% -376.20% 75.60% Classification: Full Thickness Without Exposed Full Thickness Without Exposed Full Thickness Without Exposed Support Structures Support Structures Support Structures Exudate Amount: Medium Medium Medium Exudate Type: Serosanguineous Serosanguineous Serosanguineous Exudate Color: red, brown red, brown red, brown Granulation Amount: Small (1-33%) Small (1-33%) Small (1-33%) Granulation Quality: Red, Pink Red Red Epithelialization: N/A N/A Small (1-33%) Wound Number: 8 N/A N/A Photos: N/A N/A Wound Location: Right, Distal, Lateral Lower Leg N/A N/A Wounding Event: Shear/Friction N/A N/A Primary Etiology: Venous Leg Ulcer N/A N/A Comorbid History: Congestive Heart Failure, N/A N/A Hypertension Date Acquired: 02/13/2022 N/A N/A Weeks of Treatment: 2 N/A N/A Sabol, Sally D. ([id number]) Wound Status: Open N/A N/A Wound Recurrence: No N/A N/A Measurements L x W x D (cm) 6x1.7x0.1 N/A N/A Area (cm) : 8.011 N/A N/A Volume (cm) : 0.801 N/A N/A % Reduction in Area: 21.80% N/A N/A % Reduction in Volume: 21.90% N/A N/A Classification: Full Thickness Without Exposed N/A N/A Support Structures Exudate Amount: Medium N/A N/A Exudate Type: Serosanguineous N/A N/A Exudate Color: red, brown N/A N/A Granulation Amount: Small (1-33%) N/A N/A Granulation Quality: Red N/A N/A Exposed Structures: Fat Layer (Subcutaneous Tissue): N/A N/A Yes Fascia: No Tendon: No Muscle: No Joint: No Bone: No Epithelialization:  Small (1-33%) N/A N/A Treatment Notes Electronic Signature(s) Signed: 03/06/2022 5:14:26 PM By: Massie Kluver Entered By: Massie Kluver on 03/06/2022 08:36:26 Boehle, Lou D. ([id number]) -------------------------------------------------------------------------------- Multi-Disciplinary Care Plan Details Patient Name: [name] Ewing D. Date of Service: 03/06/2022 8:15 AM Medical Record Number: [id number] Patient Account Number: [id number] Date of Birth/Sex: 02-27-1941 (81 y.o. M) Treating RN: Cornell Barman Primary Care Kenyata Napier: [name] Other Clinician: Massie Kluver Referring Nataliya Graig: [name] Treating Carina Chaplin/Extender: Skipper Cliche in Treatment: 7 Active Inactive Wound/Skin Impairment Nursing Diagnoses: Knowledge deficit related to ulceration/compromised  skin integrity Goals: Patient/caregiver will verbalize understanding of skin care regimen Date Initiated: 01/16/2022 Target Resolution Date: 02/16/2022 Goal Status: Active Ulcer/skin breakdown will have a volume reduction of 30% by week 4 Date Initiated: 01/16/2022 Target Resolution Date: 02/16/2022 Goal Status: Active Ulcer/skin breakdown will have a volume reduction of 50% by week 8 Date Initiated: 01/16/2022 Target Resolution Date: 03/19/2022 Goal Status: Active Ulcer/skin breakdown will have a volume reduction of 80% by week 12 Date Initiated: 01/16/2022 Target Resolution Date: 04/18/2022 Goal Status: Active Ulcer/skin breakdown will heal within 14 weeks Date Initiated: 01/16/2022 Target Resolution Date: 05/19/2022 Goal Status: Active Interventions: Assess patient/caregiver ability to obtain necessary supplies Assess patient/caregiver ability to perform ulcer/skin care regimen upon admission and as needed Assess ulceration(s) every visit Notes: Electronic Signature(s) Signed: 03/06/2022 9:40:40 AM By: Gretta Cool, BSN, RN, CWS, Kim RN, BSN Signed: 03/06/2022 5:14:26 PM By: Massie Kluver Entered By: Massie Kluver  on 03/06/2022 08:36:21 Freundlich, Mackinley D. ([id number]) -------------------------------------------------------------------------------- Pain Assessment Details Patient Name: [name] Ewing D. Date of Service: 03/06/2022 8:15 AM Medical Record Number: [id number] Patient Account Number: [id number] Date of Birth/Sex: Mar 20, 1941 (81 y.o. M) Treating RN: Cornell Barman Primary Care Shakelia Scrivner: [name] Other Clinician: Massie Kluver Referring Arabela Basaldua: [name] Treating Makensey Rego/Extender: Skipper Cliche in Treatment: 7 Active Problems Location of Pain Severity and Description of Pain Patient Has Paino No Site Locations Pain Management and Medication Current Pain Management: Electronic Signature(s) Signed: 03/06/2022 9:40:40 AM By: Gretta Cool, BSN, RN, CWS, Kim RN, BSN Signed: 03/06/2022 5:14:26 PM By: Massie Kluver Entered By: Massie Kluver on 03/06/2022 08:20:02 Palo, Deidre Ala D. ([id number]) -------------------------------------------------------------------------------- Patient/Caregiver Education Details Patient Name: [name] Ewing D. Date of Service: 03/06/2022 8:15 AM Medical Record Number: [id number] Patient Account Number: [id number] Date of Birth/Gender: 01-11-1941 (81 y.o. M) Treating RN: Cornell Barman Primary Care Physician: [name] Other Clinician: Massie Kluver Referring Physician: [name] Treating Physician/Extender: Skipper Cliche in Treatment: 7 Education Assessment Education Provided To: Patient Education Topics Provided Wound/Skin Impairment: Handouts: Other: continue wound care as directed Methods: Explain/Verbal Responses: State content correctly Electronic Signature(s) Signed: 03/06/2022 5:14:26 PM By: Massie Kluver Entered By: Massie Kluver on 03/06/2022 09:07:32 Youngblood, Brain D. ([id number]) -------------------------------------------------------------------------------- Wound Assessment Details Patient Name: [name] Ewing D. Date of  Service: 03/06/2022 8:15 AM Medical Record Number: [id number] Patient Account Number: [id number] Date of Birth/Sex: 10-23-1940 (81 y.o. M) Treating RN: Cornell Barman Primary Care Twilla Khouri: [name] Other Clinician: Massie Kluver Referring Launa Goedken: [name] Treating Eunice Oldaker/Extender: [name] Weeks in Treatment: 7 Wound Status Wound Number: 2 Primary Etiology: Neuropathic Ulcer-Non Diabetic Wound Location: Right, Plantar Foot Wound Status: Open Wounding Event: Gradually Appeared Comorbid History: Congestive Heart Failure, Hypertension Date Acquired: 12/28/2020 Weeks Of Treatment: 7 Clustered Wound: No Photos Wound Measurements Length: (cm) 0.8 Width: (cm) 0.3 Depth: (cm) 0.2 Area: (cm) 0.188 Volume: (cm) 0.038 % Reduction in Area: -138% % Reduction in Volume: -58.3% Wound Description Classification: Full Thickness Without Exposed Support Structu Exudate Amount: Medium Exudate Type: Serosanguineous Exudate Color: red, brown res Wound Bed Granulation Amount: Small (1-33%) Granulation Quality: Red, Pink Electronic Signature(s) Signed: 03/06/2022 9:40:40 AM By: Gretta Cool, BSN, RN, CWS, Kim RN, BSN Signed: 03/06/2022 5:14:26 PM By: Massie Kluver Entered By: Massie Kluver on 03/06/2022 08:33:26 Sarin, [place] D. ([id number]) -------------------------------------------------------------------------------- Wound Assessment Details Patient Name: [name] Ewing D. Date of Service: 03/06/2022 8:15 AM Medical Record Number: [id number] Patient Account Number: [id number] Date of Birth/Sex: 1940/08/18 (81 y.o. M) Treating RN: Cornell Barman Primary Care Taegan Standage: [name] Other Clinician: Massie Kluver Referring  Taisa Deloria: [name] Treating Chloe Baig/Extender: [name] Weeks in Treatment: 7 Wound Status Wound Number: 6 Primary Etiology: Venous Leg Ulcer Wound Location: Right, Medial Lower Leg Wound Status: Open Wounding Event: Shear/Friction Comorbid History:  Congestive Heart Failure, Hypertension Date Acquired: 02/20/2022 Weeks Of Treatment: 2 Clustered Wound: No Photos Wound Measurements Length: (cm) 1.7 Width: (cm) 1.5 Depth: (cm) 0.1 Area: (cm) 2.003 Volume: (cm) 0.2 % Reduction in Area: -372.4% % Reduction in Volume: -376.2% Wound Description Classification: Full Thickness Without Exposed Support Structu Exudate Amount: Medium Exudate Type: Serosanguineous Exudate Color: red, brown res Wound Bed Granulation Amount: Small (1-33%) Granulation Quality: Red Electronic Signature(s) Signed: 03/06/2022 9:40:40 AM By: Gretta Cool, BSN, RN, CWS, Kim RN, BSN Signed: 03/06/2022 5:14:26 PM By: Massie Kluver Entered By: Massie Kluver on 03/06/2022 08:33:57 Norton, Chamberlain D. ([id number]) -------------------------------------------------------------------------------- Wound Assessment Details Patient Name: [name] Ewing D. Date of Service: 03/06/2022 8:15 AM Medical Record Number: [id number] Patient Account Number: [id number] Date of Birth/Sex: 08-25-1940 (81 y.o. M) Treating RN: Cornell Barman Primary Care Essa Wenk: [name] Other Clinician: Massie Kluver Referring Maxamillion Banas: [name] Treating Anelisse Jacobson/Extender: [name] Weeks in Treatment: 7 Wound Status Wound Number: 7 Primary Etiology: Venous Leg Ulcer Wound Location: Right, Proximal, Lateral Lower Leg Wound Status: Open Wounding Event: Shear/Friction Comorbid History: Congestive Heart Failure, Hypertension Date Acquired: 02/13/2022 Weeks Of Treatment: 2 Clustered Wound: No Photos Wound Measurements Length: (cm) 1 Width: (cm) 2.5 Depth: (cm) 0.1 Area: (cm) 1.963 Volume: (cm) 0.196 % Reduction in Area: 75.6% % Reduction in Volume: 75.6% Epithelialization: Small (1-33%) Wound Description Classification: Full Thickness Without Exposed Support Structu Exudate Amount: Medium Exudate Type: Serosanguineous Exudate Color: red, brown res Wound Bed Granulation Amount:  Small (1-33%) Exposed Structure Granulation Quality: Red Fascia Exposed: No Fat Layer (Subcutaneous Tissue) Exposed: Yes Tendon Exposed: No Muscle Exposed: No Joint Exposed: No Bone Exposed: No Electronic Signature(s) Signed: 03/06/2022 9:40:40 AM By: Gretta Cool, BSN, RN, CWS, Kim RN, BSN Signed: 03/06/2022 5:14:26 PM By: Massie Kluver Entered By: Massie Kluver on 03/06/2022 08:34:34 Kann, Carron D. ([id number]) -------------------------------------------------------------------------------- Wound Assessment Details Patient Name: [name] Ewing D. Date of Service: 03/06/2022 8:15 AM Medical Record Number: [id number] Patient Account Number: [id number] Date of Birth/Sex: 02/24/1941 (81 y.o. M) Treating RN: Cornell Barman Primary Care Audreanna Torrisi: [name] Other Clinician: Massie Kluver Referring Joelene Barriere: [name] Treating Roch Quach/Extender: [name] Weeks in Treatment: 7 Wound Status Wound Number: 8 Primary Etiology: Venous Leg Ulcer Wound Location: Right, Distal, Lateral Lower Leg Wound Status: Open Wounding Event: Shear/Friction Comorbid History: Congestive Heart Failure, Hypertension Date Acquired: 02/13/2022 Weeks Of Treatment: 2 Clustered Wound: No Photos Wound Measurements Length: (cm) 6 Width: (cm) 1.7 Depth: (cm) 0.1 Area: (cm) 8.011 Volume: (cm) 0.801 % Reduction in Area: 21.8% % Reduction in Volume: 21.9% Epithelialization: Small (1-33%) Wound Description Classification: Full Thickness Without Exposed Support Structu Exudate Amount: Medium Exudate Type: Serosanguineous Exudate Color: red, brown res Wound Bed Granulation Amount: Small (1-33%) Exposed Structure Granulation Quality: Red Fascia Exposed: No Fat Layer (Subcutaneous Tissue) Exposed: Yes Tendon Exposed: No Muscle Exposed: No Joint Exposed: No Bone Exposed: No Electronic Signature(s) Signed: 03/06/2022 9:40:40 AM By: Gretta Cool, BSN, RN, CWS, Kim RN, BSN Signed: 03/06/2022 5:14:26 PM By: Massie Kluver Entered By: Massie Kluver on 03/06/2022 08:35:01 Foglesong, Ronan D. ([id number]) -------------------------------------------------------------------------------- Vitals Details Patient Name: [name] Ewing D. Date of Service: 03/06/2022 8:15 AM Medical Record Number: [id number] Patient Account Number: [id number] Date of Birth/Sex: 1940-12-25 (81 y.o. M) Treating RN: Cornell Barman Primary Care Skylin Kennerson: [name] Other Clinician: Clifton James,  Angie Referring Lesia Monica: [name] Treating Nafeesah Lapaglia/Extender: Skipper Cliche in Treatment: 7 Vital Signs Time Taken: 08:17 Temperature (F): 98.3 Height (in): 74 Pulse (bpm): 69 Weight (lbs): 244 Respiratory Rate (breaths/min): 18 Body Mass Index (BMI): 31.3 Blood Pressure (mmHg): 137/65 Reference Range: 80 - 120 mg / dl Electronic Signature(s) Signed: 03/06/2022 5:14:26 PM By: Massie Kluver Entered By: Massie Kluver on 03/06/2022 08:19:46

## 2022-03-13 ENCOUNTER — Encounter: Payer: PPO | Admitting: Physician Assistant

## 2022-03-13 DIAGNOSIS — E11621 Type 2 diabetes mellitus with foot ulcer: Secondary | ICD-10-CM | POA: Diagnosis not present

## 2022-03-13 DIAGNOSIS — L97512 Non-pressure chronic ulcer of other part of right foot with fat layer exposed: Secondary | ICD-10-CM | POA: Diagnosis not present

## 2022-03-13 NOTE — Progress Notes (Signed)
ARRIE, ZUERCHER ([id number]) Visit Report for 03/13/2022 Chief Complaint Document Details Patient Name: CELESTER, LECH. Date of Service: 03/13/2022 8:15 AM Medical Record Number: [id number] Patient Account Number: [id number] Date of Birth/Sex: 1940-11-16 (81 y.o. M) Treating RN: Cornell Barman Primary Care Provider: [name] Other Clinician: Massie Kluver Referring Provider: [name] Treating Provider/Extender: Skipper Cliche in Treatment: 8 Information Obtained from: Patient Chief Complaint Multiple LE Ulcers Electronic Signature(s) Signed: 03/13/2022 8:32:12 AM By: Worthy Keeler PA-C Entered By: Worthy Keeler on 03/13/2022 08:32:12 Studnicka, Jancarlos D. ([id number]) -------------------------------------------------------------------------------- Problem List Details Patient Name: [name] Date of Service: 03/13/2022 8:15 AM Medical Record Number: [id number] Patient Account Number: [id number] Date of Birth/Sex: June 20, 1941 (81 y.o. M) Treating RN: Cornell Barman Primary Care Provider: [name] Other Clinician: Massie Kluver Referring Provider: [name] Treating Provider/Extender: Skipper Cliche in Treatment: 8 Active Problems ICD-10 Encounter Code Description Active Date MDM Diagnosis I87.333 Chronic venous hypertension (idiopathic) with ulcer and inflammation of 01/16/2022 No Yes bilateral lower extremity L97.812 Non-pressure chronic ulcer of other part of right lower leg with fat layer 01/16/2022 No Yes exposed L97.822 Non-pressure chronic ulcer of other part of left lower leg with fat layer 01/16/2022 No Yes exposed G90.09 Other idiopathic peripheral autonomic neuropathy 01/16/2022 No Yes L97.522 Non-pressure chronic ulcer of other part of left foot with fat layer 01/16/2022 No Yes exposed L97.512 Non-pressure chronic ulcer of other part of right foot with fat layer 01/16/2022 No Yes exposed [place] (primary) hypertension 01/16/2022 No  Yes 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 ([id number]) N18.32 Chronic kidney disease, stage 3b 01/16/2022 No Yes Inactive Problems Resolved Problems Electronic Signature(s) Signed: 03/13/2022 8:32:10 AM By: Worthy Keeler PA-C Entered By: Worthy Keeler on 03/13/2022 08:32:09

## 2022-03-17 NOTE — Progress Notes (Signed)
DEUNDRA, BARD ([id number]) Visit Report for 03/13/2022 Arrival Information Details Patient Name: [name], [name]. Date of Service: 03/13/2022 8:15 AM Medical Record Number: [id number] Patient Account Number: [id number] Date of Birth/Sex: 05/24/1941 (81 y.o. M) Treating RN: Cornell Barman Primary Care Camille Dragan: [name] Other Clinician: Massie Kluver Referring Dajion Bickford: [name] Treating Sirus Labrie/Extender: Skipper Cliche in Treatment: 8 Visit Information History Since Last Visit All ordered tests and consults were completed: No Patient Arrived: [name] Added or deleted any medications: No Arrival Time: 08:15 Any new allergies or adverse reactions: No Transfer Assistance: None Had a fall or experienced change in No Patient Requires Transmission-Based Precautions: No activities of daily living that may affect Patient Has Alerts: No risk of falls: Hospitalized since last visit: No Pain Present Now: No Electronic Signature(s) Signed: 03/17/2022 9:44:14 AM By: Massie Kluver Entered By: Massie Kluver on 03/13/2022 08:15:46 Marion, [name] ([id number]) -------------------------------------------------------------------------------- Clinic Level of Care Assessment Details Patient Name: [name] Ewing D. Date of Service: 03/13/2022 8:15 AM Medical Record Number: [id number] Patient Account Number: [id number] Date of Birth/Sex: 21-Mar-1941 (81 y.o. M) Treating RN: Cornell Barman Primary Care Yatzil Clippinger: [name] Other Clinician: Massie Kluver Referring Calani Gick: [name] Treating Angelynn Lemus/Extender: Skipper Cliche in Treatment: 8 Clinic Level of Care Assessment Items TOOL 4 Quantity Score []  - Use when only an EandM is performed on FOLLOW-UP visit 0 ASSESSMENTS - Nursing Assessment / Reassessment X - Reassessment of Co-morbidities (includes updates in patient status) 1 10 X- 1 5 Reassessment of Adherence to Treatment Plan ASSESSMENTS - Wound and Skin Assessment /  Reassessment []  - Simple Wound Assessment / Reassessment - one wound 0 X- 4 5 Complex Wound Assessment / Reassessment - multiple wounds []  - 0 Dermatologic / Skin Assessment (not related to wound area) ASSESSMENTS - Focused Assessment []  - Circumferential Edema Measurements - multi extremities 0 []  - 0 Nutritional Assessment / Counseling / Intervention []  - 0 Lower Extremity Assessment (monofilament, tuning fork, pulses) []  - 0 Peripheral Arterial Disease Assessment (using hand held doppler) ASSESSMENTS - Ostomy and/or Continence Assessment and Care []  - Incontinence Assessment and Management 0 []  - 0 Ostomy Care Assessment and Management (repouching, etc.) PROCESS - Coordination of Care X - Simple Patient / Family Education for ongoing care 1 15 []  - 0 Complex (extensive) Patient / Family Education for ongoing care []  - 0 Staff obtains Programmer, systems, Records, Test Results / Process Orders []  - 0 Staff telephones HHA, Nursing Homes / Clarify orders / etc []  - 0 Routine Transfer to another Facility (non-emergent condition) []  - 0 Routine Hospital Admission (non-emergent condition) []  - 0 New Admissions / Biomedical engineer / Ordering NPWT, Apligraf, etc. []  - 0 Emergency Hospital Admission (emergent condition) X- 1 10 Simple Discharge Coordination []  - 0 Complex (extensive) Discharge Coordination PROCESS - Special Needs []  - Pediatric / Minor Patient Management 0 []  - 0 Isolation Patient Management []  - 0 Hearing / Language / Visual special needs []  - 0 Assessment of Community assistance (transportation, D/C planning, etc.) []  - 0 Additional assistance / Altered mentation []  - 0 Support Surface(s) Assessment (bed, cushion, seat, etc.) INTERVENTIONS - Wound Cleansing / Measurement Hagner, [name] ([id number]) []  - 0 Simple Wound Cleansing - one wound X- 4 5 Complex Wound Cleansing - multiple wounds X- 1 5 Wound Imaging (photographs - any number of wounds) []   - 0 Wound Tracing (instead of photographs) X- 1 5 Simple Wound Measurement - one wound []  - 0 Complex Wound Measurement -  multiple wounds INTERVENTIONS - Wound Dressings X - Small Wound Dressing one or multiple wounds 1 10 []  - 0 Medium Wound Dressing one or multiple wounds []  - 0 Large Wound Dressing one or multiple wounds []  - 0 Application of Medications - topical []  - 0 Application of Medications - injection INTERVENTIONS - Miscellaneous []  - External ear exam 0 []  - 0 Specimen Collection (cultures, biopsies, blood, body fluids, etc.) []  - 0 Specimen(s) / Culture(s) sent or taken to Lab for analysis []  - 0 Patient Transfer (multiple staff / Civil Service fast streamer / Similar devices) []  - 0 Simple Staple / Suture removal (25 or less) []  - 0 Complex Staple / Suture removal (26 or more) []  - 0 Hypo / Hyperglycemic Management (close monitor of Blood Glucose) []  - 0 Ankle / Brachial Index (ABI) - do not check if billed separately X- 1 5 Vital Signs Has the patient been seen at the hospital within the last three years: Yes Total Score: 105 Level Of Care: New/Established - Level 3 Electronic Signature(s) Signed: 03/17/2022 9:44:14 AM By: Massie Kluver Entered By: Massie Kluver on 03/13/2022 08:38:26 [name], [name] ([id number]) -------------------------------------------------------------------------------- Encounter Discharge Information Details Patient Name: [name] Ewing D. Date of Service: 03/13/2022 8:15 AM Medical Record Number: [id number] Patient Account Number: [id number] Date of Birth/Sex: [birth year] (81 y.o. M) Treating RN: Cornell Barman Primary Care Jannetta Massey: [name] Other Clinician: Massie Kluver Referring Rasheeda Mulvehill: [name] Treating Milik Gilreath/Extender: Skipper Cliche in Treatment: 8 Encounter Discharge Information Items Discharge Condition: Stable Ambulatory Status: Walker Discharge Destination: Home Transportation: Private Auto Accompanied By:  self Schedule Follow-up Appointment: Yes Clinical Summary of Care: Electronic Signature(s) Signed: 03/17/2022 9:44:14 AM By: Massie Kluver Entered By: Massie Kluver on 03/13/2022 08:54:14 Halley, [name] ([id number]) -------------------------------------------------------------------------------- Lower Extremity Assessment Details Patient Name: [name] Ewing D. Date of Service: 03/13/2022 8:15 AM Medical Record Number: [id number] Patient Account Number: [id number] Date of Birth/Sex: Dec 31, 1940 (81 y.o. M) Treating RN: Cornell Barman Primary Care Daziyah Cogan: [name] Other Clinician: Massie Kluver Referring Saydi Kobel: [name] Treating Nely Dedmon/Extender: [name] Weeks in Treatment: 8 Edema Assessment Assessed: [Left: No] [Right: Yes] Edema: [Left: Ye] [Right: s] Calf Left: Right: Point of Measurement: 38 cm From Medial Instep 40 cm Ankle Left: Right: Point of Measurement: 15 cm From Medial Instep 27.9 cm Vascular Assessment Pulses: Dorsalis Pedis Palpable: [Right:No] Posterior Tibial Palpable: [Right:No] Notes sees vascular doctor on 05/05/22 Electronic Signature(s) Signed: 03/13/2022 11:19:49 AM By: Gretta Cool, BSN, RN, CWS, Kim RN, BSN Signed: 03/17/2022 9:44:14 AM By: Massie Kluver Entered By: Massie Kluver on 03/13/2022 08:29:59 Zendejas, [name] ([id number]) -------------------------------------------------------------------------------- Multi Wound Chart Details Patient Name: [name] Ewing D. Date of Service: 03/13/2022 8:15 AM Medical Record Number: [id number] Patient Account Number: [id number] Date of Birth/Sex: 07/28/1940 (81 y.o. M) Treating RN: Cornell Barman Primary Care Shelly Shoultz: [name] Other Clinician: Massie Kluver Referring Natnael Biederman: [name] Treating Darleny Sem/Extender: [name] Weeks in Treatment: 8 Vital Signs Height(in): 74 Pulse(bpm): 30 Weight(lbs): 244 Blood Pressure(mmHg): 133/65 Body Mass Index(BMI): 31.3 Temperature(F):  98.1 Respiratory Rate(breaths/min): 18 Photos: Wound Location: Right, Plantar Foot Right, Medial Lower Leg Right, Proximal, Lateral Lower Leg Wounding Event: Gradually Appeared Shear/Friction Shear/Friction Primary Etiology: Neuropathic Ulcer-Non Diabetic Venous Leg Ulcer Venous Leg Ulcer Comorbid History: Congestive Heart Failure, Congestive Heart Failure, Congestive Heart Failure, Hypertension Hypertension Hypertension Date Acquired: 12/28/2020 02/20/2022 02/13/2022 Weeks of Treatment: 8 3 3  Wound Status: Open Open Open Wound Recurrence: No No No Measurements L x W x D (cm) 0.2x0.1x0.1 0.1x0.1x0.1 0.1x0.1x0.1 Area (cm) :  0.016 0.008 0.008 Volume (cm) : 0.002 0.001 0.001 % Reduction in Area: 79.70% 98.10% 99.90% % Reduction in Volume: 91.70% 97.60% 99.90% Classification: Full Thickness Without Exposed Full Thickness Without Exposed Full Thickness Without Exposed Support Structures Support Structures Support Structures Exudate Amount: Medium Medium Medium Exudate Type: Serosanguineous Serosanguineous Serosanguineous Exudate Color: red, brown red, brown red, brown Granulation Amount: Small (1-33%) Small (1-33%) Small (1-33%) Granulation Quality: Red, Pink Red Red Epithelialization: N/A N/A Small (1-33%) Wound Number: 8 N/A N/A Photos: N/A N/A Wound Location: Right, Distal, Lateral Lower Leg N/A N/A Wounding Event: Shear/Friction N/A N/A Primary Etiology: Venous Leg Ulcer N/A N/A Comorbid History: Congestive Heart Failure, N/A N/A Hypertension Date Acquired: 02/13/2022 N/A N/A Weeks of Treatment: 3 N/A N/A Belinsky, [name] ([id number]) Wound Status: Open N/A N/A Wound Recurrence: No N/A N/A Measurements L x W x D (cm) 0.1x0.1x0.1 N/A N/A Area (cm) : 0.008 N/A N/A Volume (cm) : 0.001 N/A N/A % Reduction in Area: 99.90% N/A N/A % Reduction in Volume: 99.90% N/A N/A Classification: Full Thickness Without Exposed N/A N/A Support Structures Exudate Amount: Medium N/A  N/A Exudate Type: Serosanguineous N/A N/A Exudate Color: red, brown N/A N/A Granulation Amount: Small (1-33%) N/A N/A Granulation Quality: Red N/A N/A Exposed Structures: Fat Layer (Subcutaneous Tissue): N/A N/A Yes Fascia: No Tendon: No Muscle: No Joint: No Bone: No Epithelialization: Small (1-33%) N/A N/A Treatment Notes Electronic Signature(s) Signed: 03/17/2022 9:44:14 AM By: Massie Kluver Entered By: Massie Kluver on 03/13/2022 08:30:09 Rennert, [name] ([id number]) -------------------------------------------------------------------------------- Multi-Disciplinary Care Plan Details Patient Name: [name] Ewing D. Date of Service: 03/13/2022 8:15 AM Medical Record Number: [id number] Patient Account Number: [id number] Date of Birth/Sex: 1940-09-12 (81 y.o. M) Treating RN: Cornell Barman Primary Care Sharonda Llamas: [name] Other Clinician: Massie Kluver Referring Jeromie Gainor: [name] Treating Melville Engen/Extender: Skipper Cliche in Treatment: 8 Active Inactive Wound/Skin Impairment Nursing Diagnoses: Knowledge deficit related to ulceration/compromised skin integrity Goals: Patient/caregiver will verbalize understanding of skin care regimen Date Initiated: 01/16/2022 Target Resolution Date: 02/16/2022 Goal Status: Active Ulcer/skin breakdown will have a volume reduction of 30% by week 4 Date Initiated: 01/16/2022 Target Resolution Date: 02/16/2022 Goal Status: Active Ulcer/skin breakdown will have a volume reduction of 50% by week 8 Date Initiated: 01/16/2022 Target Resolution Date: 03/19/2022 Goal Status: Active Ulcer/skin breakdown will have a volume reduction of 80% by week 12 Date Initiated: 01/16/2022 Target Resolution Date: 04/18/2022 Goal Status: Active Ulcer/skin breakdown will heal within 14 weeks Date Initiated: 01/16/2022 Target Resolution Date: 05/19/2022 Goal Status: Active Interventions: Assess patient/caregiver ability to obtain necessary  supplies Assess patient/caregiver ability to perform ulcer/skin care regimen upon admission and as needed Assess ulceration(s) every visit Notes: Electronic Signature(s) Signed: 03/13/2022 11:19:49 AM By: Gretta Cool, BSN, RN, CWS, Kim RN, BSN Signed: 03/17/2022 9:44:14 AM By: Massie Kluver Entered By: Massie Kluver on 03/13/2022 08:30:03 [name], [name] ([id number]) -------------------------------------------------------------------------------- Pain Assessment Details Patient Name: [name] Ewing D. Date of Service: 03/13/2022 8:15 AM Medical Record Number: [id number] Patient Account Number: [id number] Date of Birth/Sex: 1941/04/18 (81 y.o. M) Treating RN: Cornell Barman Primary Care Kamarrion Stfort: [name] Other Clinician: Massie Kluver Referring Dorlisa Savino: [name] Treating [place] Sloop/Extender: Skipper Cliche in Treatment: 8 Active Problems Location of Pain Severity and Description of Pain Patient Has Paino No Site Locations Pain Management and Medication Current Pain Management: Electronic Signature(s) Signed: 03/13/2022 11:19:49 AM By: Gretta Cool, BSN, RN, CWS, Kim RN, BSN Signed: 03/17/2022 9:44:14 AM By: Massie Kluver Entered By: Massie Kluver on 03/13/2022 08:17:55 Hendry, [name] ([id number]) --------------------------------------------------------------------------------  Patient/Caregiver Education Details Patient Name: KEATON, BEICHNER. Date of Service: 03/13/2022 8:15 AM Medical Record Number: [id number] Patient Account Number: [id number] Date of Birth/Gender: Sep 17, 1940 (81 y.o. M) Treating RN: Cornell Barman Primary Care Physician: [name] Other Clinician: Massie Kluver Referring Physician: [name] Treating Physician/Extender: Skipper Cliche in Treatment: 8 Education Assessment Education Provided To: Patient Education Topics Provided Wound/Skin Impairment: Handouts: Other: continue wound care as directed Methods: Explain/Verbal Responses: State  content correctly Electronic Signature(s) Signed: 03/17/2022 9:44:14 AM By: Massie Kluver Entered By: Massie Kluver on 03/13/2022 08:38:55 Samonte, [name] ([id number]) -------------------------------------------------------------------------------- Wound Assessment Details Patient Name: [name] Ewing D. Date of Service: 03/13/2022 8:15 AM Medical Record Number: [id number] Patient Account Number: [id number] Date of Birth/Sex: September 15, 1940 (81 y.o. M) Treating RN: Cornell Barman Primary Care Rollan Roger: [name] Other Clinician: Massie Kluver Referring Jonella Redditt: [name] Treating Keyry Iracheta/Extender: [name] Weeks in Treatment: 8 Wound Status Wound Number: 2 Primary Etiology: Neuropathic Ulcer-Non Diabetic Wound Location: Right, Plantar Foot Wound Status: Open Wounding Event: Gradually Appeared Comorbid History: Congestive Heart Failure, Hypertension Date Acquired: 12/28/2020 Weeks Of Treatment: 8 Clustered Wound: No Photos Wound Measurements Length: (cm) 0.1 Width: (cm) 0.1 Depth: (cm) 0.1 Area: (cm) 0.008 Volume: (cm) 0.001 % Reduction in Area: 89.9% % Reduction in Volume: 95.8% Wound Description Classification: Full Thickness Without Exposed Support Structu Exudate Amount: Medium Exudate Type: Serosanguineous Exudate Color: red, brown res Wound Bed Granulation Amount: Small (1-33%) Granulation Quality: Red, Pink Treatment Notes Wound #2 (Foot) Wound Laterality: Plantar, Right Cleanser Wound Cleanser Discharge Instruction: Wash your hands with soap and water. Remove old dressing, discard into plastic bag and place into trash. Cleanse the wound with Wound Cleanser prior to applying a clean dressing using gauze sponges, not tissues or cotton balls. Do not scrub or use excessive force. Pat dry using gauze sponges, not tissue or cotton balls. Peri-Wound Care Topical Primary Dressing Silvercel Small 2x2 (in/in) [name], [name] ([id number]) Discharge  Instruction: Apply Silvercel Small 2x2 (in/in) as instructed Secondary Dressing Zetuvit Plus 4x8 (in/in) Secured With Medipore Tape - 15M Medipore H Soft Cloth Surgical Tape, 2x2 (in/yd) Conform 4'' - Conforming Stretch Gauze Bandage 4x75 (in/in) Discharge Instruction: Apply as directed Tubigrip Size E, 3.5x10 (in/yds) Discharge Instruction: Apply 3 Tubigrip E 3-finger-widths below knee to base of toes to secure dressing and/or for swelling. Compression Wrap Compression Stockings Add-Ons Electronic Signature(s) Signed: 03/13/2022 11:19:49 AM By: Gretta Cool, BSN, RN, CWS, Kim RN, BSN Signed: 03/17/2022 9:44:14 AM By: Massie Kluver Entered By: Massie Kluver on 03/13/2022 08:36:27 [name], [name] ([id number]) -------------------------------------------------------------------------------- Wound Assessment Details Patient Name: [name] Ewing D. Date of Service: 03/13/2022 8:15 AM Medical Record Number: [id number] Patient Account Number: [id number] Date of Birth/Sex: Nov 07, 1940 (81 y.o. M) Treating RN: Cornell Barman Primary Care Taela Charbonneau: [name] Other Clinician: Massie Kluver Referring Cherryl Babin: [name] Treating Gloriajean Okun/Extender: [name] Weeks in Treatment: 8 Wound Status Wound Number: 6 Primary Etiology: Venous Leg Ulcer Wound Location: Right, Medial Lower Leg Wound Status: Healed - Epithelialized Wounding Event: Shear/Friction Comorbid History: Congestive Heart Failure, Hypertension Date Acquired: 02/20/2022 Weeks Of Treatment: 3 Clustered Wound: No Photos Wound Measurements Length: (cm) 0 Width: (cm) 0 Depth: (cm) 0 Area: (cm) Volume: (cm) % Reduction in Area: 100% % Reduction in Volume: 100% 0 0 Wound Description Classification: Full Thickness Without Exposed Support Structu Exudate Amount: Medium Exudate Type: Serosanguineous Exudate Color: red, brown res Wound Bed Granulation Amount: Small (1-33%) Granulation Quality: Red Treatment Notes Wound #6  (Lower Leg) Wound Laterality: Right, Medial  Cleanser Peri-Wound Care Topical Primary Dressing Secondary Dressing Secured With Compression Wrap KAYVEN, ALDACO ([id number]) Compression Stockings Add-Ons Electronic Signature(s) Signed: 03/13/2022 11:19:49 AM By: Gretta Cool, BSN, RN, CWS, Kim RN, BSN Signed: 03/17/2022 9:44:14 AM By: Massie Kluver Entered By: Massie Kluver on 03/13/2022 08:35:12 [place], [place]. ([id number]) -------------------------------------------------------------------------------- Wound Assessment Details Patient Name: [name] Ewing D. Date of Service: 03/13/2022 8:15 AM Medical Record Number: [id number] Patient Account Number: [id number] Date of Birth/Sex: 06-Jul-1940 (81 y.o. M) Treating RN: Cornell Barman Primary Care Electra Paladino: [name] Other Clinician: Massie Kluver Referring Marleigh Kaylor: [name] Treating Antoria Lanza/Extender: [name] Weeks in Treatment: 8 Wound Status Wound Number: 7 Primary Etiology: Venous Leg Ulcer Wound Location: Right, Proximal, Lateral Lower Leg Wound Status: Healed - Epithelialized Wounding Event: Shear/Friction Comorbid History: Congestive Heart Failure, Hypertension Date Acquired: 02/13/2022 Weeks Of Treatment: 3 Clustered Wound: No Photos Wound Measurements Length: (cm) 0 Width: (cm) 0 Depth: (cm) 0 Area: (cm) Volume: (cm) % Reduction in Area: 100% % Reduction in Volume: 100% Epithelialization: Small (1-33%) 0 0 Wound Description Classification: Full Thickness Without Exposed Support Structu Exudate Amount: Medium Exudate Type: Serosanguineous Exudate Color: red, brown res Wound Bed Granulation Amount: Small (1-33%) Exposed Structure Granulation Quality: Red Fascia Exposed: No Fat Layer (Subcutaneous Tissue) Exposed: Yes Tendon Exposed: No Muscle Exposed: No Joint Exposed: No Bone Exposed: No Treatment Notes Wound #7 (Lower Leg) Wound Laterality: Right, Lateral, Proximal Cleanser Peri-Wound  Care Topical Primary Dressing RAVINDRA, BARANEK ([id number]) Secondary Dressing Secured With Compression Wrap Compression Stockings Add-Ons Electronic Signature(s) Signed: 03/13/2022 11:19:49 AM By: Gretta Cool, BSN, RN, CWS, Kim RN, BSN Signed: 03/17/2022 9:44:14 AM By: Massie Kluver Entered By: Massie Kluver on 03/13/2022 08:35:12 Worlds, Nickalas D. ([id number]) -------------------------------------------------------------------------------- Wound Assessment Details Patient Name: [name] Ewing D. Date of Service: 03/13/2022 8:15 AM Medical Record Number: [id number] Patient Account Number: [id number] Date of Birth/Sex: 14-Nov-1940 (81 y.o. M) Treating RN: Cornell Barman Primary Care Milia Warth: [name] Other Clinician: Massie Kluver Referring Maleni Seyer: [name] Treating Escarlet Saathoff/Extender: [name] Weeks in Treatment: 8 Wound Status Wound Number: 8 Primary Etiology: Venous Leg Ulcer Wound Location: Right, Distal, Lateral Lower Leg Wound Status: Healed - Epithelialized Wounding Event: Shear/Friction Comorbid History: Congestive Heart Failure, Hypertension Date Acquired: 02/13/2022 Weeks Of Treatment: 3 Clustered Wound: No Photos Wound Measurements Length: (cm) 0 Width: (cm) 0 Depth: (cm) 0 Area: (cm) Volume: (cm) % Reduction in Area: 100% % Reduction in Volume: 100% Epithelialization: Small (1-33%) 0 0 Wound Description Classification: Full Thickness Without Exposed Support Structu Exudate Amount: Medium Exudate Type: Serosanguineous Exudate Color: red, brown res Wound Bed Granulation Amount: Small (1-33%) Exposed Structure Granulation Quality: Red Fascia Exposed: No Fat Layer (Subcutaneous Tissue) Exposed: Yes Tendon Exposed: No Muscle Exposed: No Joint Exposed: No Bone Exposed: No Treatment Notes Wound #8 (Lower Leg) Wound Laterality: Right, Lateral, Distal Cleanser Peri-Wound Care Topical Primary Dressing COURVOISIER, HAMBLEN ([id number]) Secondary  Dressing Secured With Compression Wrap Compression Stockings Add-Ons Electronic Signature(s) Signed: 03/13/2022 11:19:49 AM By: Gretta Cool, BSN, RN, CWS, Kim RN, BSN Signed: 03/17/2022 9:44:14 AM By: Massie Kluver Entered By: Massie Kluver on 03/13/2022 08:35:12 Grosser, Ariez D. ([id number]) -------------------------------------------------------------------------------- Vitals Details Patient Name: [name] Ewing D. Date of Service: 03/13/2022 8:15 AM Medical Record Number: [id number] Patient Account Number: [id number] Date of Birth/Sex: 27-Feb-1941 (81 y.o. M) Treating RN: Cornell Barman Primary Care Koltin Wehmeyer: [name] Other Clinician: Massie Kluver Referring Nadege Carriger: [name] Treating Soma Bachand/Extender: Skipper Cliche in Treatment: 8 Vital Signs Time Taken: 08:15 Temperature (F): 98.1 Height (in): 74  Pulse (bpm): 65 Weight (lbs): 244 Respiratory Rate (breaths/min): 18 Body Mass Index (BMI): 31.3 Blood Pressure (mmHg): 133/65 Reference Range: 80 - 120 mg / dl Electronic Signature(s) Signed: 03/17/2022 9:44:14 AM By: Massie Kluver Entered By: Massie Kluver on 03/13/2022 08:17:50

## 2022-03-18 DIAGNOSIS — E785 Hyperlipidemia, unspecified: Secondary | ICD-10-CM | POA: Diagnosis not present

## 2022-03-18 DIAGNOSIS — N1832 Chronic kidney disease, stage 3b: Secondary | ICD-10-CM | POA: Diagnosis not present

## 2022-03-18 DIAGNOSIS — I480 Paroxysmal atrial fibrillation: Secondary | ICD-10-CM | POA: Diagnosis not present

## 2022-03-18 DIAGNOSIS — I5022 Chronic systolic (congestive) heart failure: Secondary | ICD-10-CM | POA: Diagnosis not present

## 2022-03-18 DIAGNOSIS — I70203 Unspecified atherosclerosis of native arteries of extremities, bilateral legs: Secondary | ICD-10-CM | POA: Diagnosis not present

## 2022-03-18 DIAGNOSIS — I10 Essential (primary) hypertension: Secondary | ICD-10-CM | POA: Diagnosis not present

## 2022-03-18 DIAGNOSIS — I420 Dilated cardiomyopathy: Secondary | ICD-10-CM | POA: Diagnosis not present

## 2022-03-20 ENCOUNTER — Ambulatory Visit: Payer: PPO

## 2022-03-21 DIAGNOSIS — E11621 Type 2 diabetes mellitus with foot ulcer: Secondary | ICD-10-CM | POA: Diagnosis not present

## 2022-03-21 DIAGNOSIS — G9009 Other idiopathic peripheral autonomic neuropathy: Secondary | ICD-10-CM | POA: Diagnosis not present

## 2022-03-21 DIAGNOSIS — L97512 Non-pressure chronic ulcer of other part of right foot with fat layer exposed: Secondary | ICD-10-CM | POA: Diagnosis not present

## 2022-03-21 NOTE — Progress Notes (Signed)
JYLES, SONTAG ([id number]) Visit Report for 03/21/2022 Problem List Details Patient Name: [name], [name]. Date of Service: 03/21/2022 7:30 AM Medical Record Number: [id number] Patient Account Number: [id number] Date of Birth/Sex: 10/16/1940 (81 y.o. M) Treating RN: Cornell Barman Primary Care Provider: [name] Other Clinician: Massie Kluver Referring Provider: [name] Treating Provider/Extender: Skipper Cliche in Treatment: 9 Active Problems ICD-10 Encounter Code Description Active Date MDM Diagnosis I87.333 Chronic venous hypertension (idiopathic) with ulcer and inflammation of 01/16/2022 No Yes bilateral lower extremity L97.812 Non-pressure chronic ulcer of other part of right lower leg with fat layer 01/16/2022 No Yes exposed L97.822 Non-pressure chronic ulcer of other part of left lower leg with fat layer 01/16/2022 No Yes exposed G90.09 Other idiopathic peripheral autonomic neuropathy 01/16/2022 No Yes L97.522 Non-pressure chronic ulcer of other part of left foot with fat layer 01/16/2022 No Yes exposed L97.512 Non-pressure chronic ulcer of other part of right foot with fat layer 01/16/2022 No Yes exposed [place] (primary) hypertension 01/16/2022 No Yes 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 ([id number]) I25.10 Atherosclerotic heart disease of native coronary artery without angina 01/16/2022 No Yes pectoris N18.32 Chronic kidney disease, stage 3b 01/16/2022 No Yes Inactive Problems Resolved Problems Electronic Signature(s) Signed: 03/21/2022 8:00:02 AM By: Worthy Keeler PA-C Entered By: Worthy Keeler on 03/21/2022 08:00:02

## 2022-03-24 NOTE — Progress Notes (Signed)
DEMETRIE, BORGE ([id number]) Visit Report for 03/21/2022 Arrival Information Details Patient Name: [name], [name]. Date of Service: 03/21/2022 7:30 AM Medical Record Number: [id number] Patient Account Number: [id number] Date of Birth/Sex: 05-03-1941 (81 y.o. M) Treating RN: Cornell Barman Primary Care Alphia Behanna: [name] Other Clinician: Massie Kluver Referring Jackalynn Art: [name] Treating Darry Kelnhofer/Extender: Skipper Cliche in Treatment: 9 Visit Information History Since Last Visit All ordered tests and consults were completed: No Patient Arrived: [name] Added or deleted any medications: No Arrival Time: 07:47 Any new allergies or adverse reactions: No Transfer Assistance: None Had a fall or experienced change in No Patient Requires Transmission-Based Precautions: No activities of daily living that may affect Patient Has Alerts: No risk of falls: Hospitalized since last visit: No Pain Present Now: No Electronic Signature(s) Signed: 03/21/2022 1:10:36 PM By: Massie Kluver Entered By: Massie Kluver on 03/21/2022 07:48:12 [name], [name] ([id number]) -------------------------------------------------------------------------------- Clinic Level of Care Assessment Details Patient Name: [name] Ewing D. Date of Service: 03/21/2022 7:30 AM Medical Record Number: [id number] Patient Account Number: [id number] Date of Birth/Sex: 21-May-1941 (81 y.o. M) Treating RN: Cornell Barman Primary Care Devine Klingel: [name] Other Clinician: Massie Kluver Referring Skylan Lara: [name] Treating Trew Sunde/Extender: Skipper Cliche in Treatment: 9 Clinic Level of Care Assessment Items TOOL 1 Quantity Score []  - Use when EandM and Procedure is performed on INITIAL visit 0 ASSESSMENTS - Nursing Assessment / Reassessment []  - General Physical Exam (combine w/ comprehensive assessment (listed just below) when performed on new 0 pt. evals) []  - 0 Comprehensive Assessment (HX, ROS, Risk  Assessments, Wounds Hx, etc.) ASSESSMENTS - Wound and Skin Assessment / Reassessment []  - Dermatologic / Skin Assessment (not related to wound area) 0 ASSESSMENTS - Ostomy and/or Continence Assessment and Care []  - Incontinence Assessment and Management 0 []  - 0 Ostomy Care Assessment and Management (repouching, etc.) PROCESS - Coordination of Care []  - Simple Patient / Family Education for ongoing care 0 []  - 0 Complex (extensive) Patient / Family Education for ongoing care []  - 0 Staff obtains Programmer, systems, Records, Test Results / Process Orders []  - 0 Staff telephones HHA, Nursing Homes / Clarify orders / etc []  - 0 Routine Transfer to another Facility (non-emergent condition) []  - 0 Routine Hospital Admission (non-emergent condition) []  - 0 New Admissions / Biomedical engineer / Ordering NPWT, Apligraf, etc. []  - 0 Emergency Hospital Admission (emergent condition) PROCESS - Special Needs []  - Pediatric / Minor Patient Management 0 []  - 0 Isolation Patient Management []  - 0 Hearing / Language / Visual special needs []  - 0 Assessment of Community assistance (transportation, D/C planning, etc.) []  - 0 Additional assistance / Altered mentation []  - 0 Support Surface(s) Assessment (bed, cushion, seat, etc.) INTERVENTIONS - Miscellaneous []  - External ear exam 0 []  - 0 Patient Transfer (multiple staff / Civil Service fast streamer / Similar devices) []  - 0 Simple Staple / Suture removal (25 or less) []  - 0 Complex Staple / Suture removal (26 or more) []  - 0 Hypo/Hyperglycemic Management (do not check if billed separately) []  - 0 Ankle / Brachial Index (ABI) - do not check if billed separately Has the patient been seen at the hospital within the last three years: Yes Total Score: 0 Level Of Care: ____ [name] ([id number]) Electronic Signature(s) Signed: 03/21/2022 1:10:36 PM By: Massie Kluver Entered By: Massie Kluver on 03/21/2022 08:08:24 [name], [name]  ([id number]) -------------------------------------------------------------------------------- Encounter Discharge Information Details Patient Name: [name] Ewing D. Date of Service: 03/21/2022 7:30 AM Medical  Record Number: [id number] Patient Account Number: [id number] Date of Birth/Sex: 09/21/1940 (81 y.o. M) Treating RN: Cornell Barman Primary Care Cicero Noy: [name] Other Clinician: Massie Kluver Referring Loyd Marhefka: [name] Treating Maleni Seyer/Extender: Skipper Cliche in Treatment: 9 Encounter Discharge Information Items Post Procedure Vitals Discharge Condition: Stable Temperature (F): 98.1 Ambulatory Status: Walker Pulse (bpm): 73 Discharge Destination: Home Respiratory Rate (breaths/min): 18 Transportation: Private Auto Blood Pressure (mmHg): 144/53 Accompanied By: self Schedule Follow-up Appointment: No Clinical Summary of Care: Electronic Signature(s) Signed: 03/21/2022 1:10:36 PM By: Massie Kluver Entered By: Massie Kluver on 03/21/2022 11:07:28 [name], [name] ([id number]) -------------------------------------------------------------------------------- Lower Extremity Assessment Details Patient Name: [name] Ewing D. Date of Service: 03/21/2022 7:30 AM Medical Record Number: [id number] Patient Account Number: [id number] Date of Birth/Sex: 1940/09/05 (81 y.o. M) Treating RN: Cornell Barman Primary Care Alaysha Jefcoat: [name] Other Clinician: Massie Kluver Referring Tyrece Vanterpool: [name] Treating Marilu Rylander/Extender: [name] Weeks in Treatment: 9 Edema Assessment Assessed: [Left: No] [name]: Yes] Edema: [Left: Ye] [Right: s] Calf Left: Right: Point of Measurement: 38 cm From Medial Instep 39.3 cm Ankle Left: Right: Point of Measurement: 15 cm From Medial Instep 28 cm Vascular Assessment Pulses: Dorsalis Pedis Palpable: [Right:Yes] Electronic Signature(s) Signed: 03/21/2022 1:10:36 PM By: Massie Kluver Signed: 03/24/2022 8:05:30 AM By: Gretta Cool, BSN,  RN, CWS, Kim RN, BSN Entered By: Massie Kluver on 03/21/2022 07:57:52 [name], [name] ([id number]) -------------------------------------------------------------------------------- Multi Wound Chart Details Patient Name: [name] Ewing D. Date of Service: 03/21/2022 7:30 AM Medical Record Number: [id number] Patient Account Number: [id number] Date of Birth/Sex: 03-28-1941 (81 y.o. M) Treating RN: Cornell Barman Primary Care Tamar Miano: [name] Other Clinician: Massie Kluver Referring Jodey Burbano: [name] Treating Saramarie Stinger/Extender: Skipper Cliche in Treatment: 9 Vital Signs Height(in): 74 Pulse(bpm): 75 Weight(lbs): 244 Blood Pressure(mmHg): 144/53 Body Mass Index(BMI): 31.3 Temperature(F): 98.1 Respiratory Rate(breaths/min): 18 Photos: [N/A:N/A] Wound Location: Right, Plantar Foot N/A N/A Wounding Event: Gradually Appeared N/A N/A Primary Etiology: Neuropathic Ulcer-Non Diabetic N/A N/A Comorbid History: Congestive Heart Failure, N/A N/A Hypertension Date Acquired: 12/28/2020 N/A N/A Weeks of Treatment: 9 N/A N/A Wound Status: Open N/A N/A Wound Recurrence: No N/A N/A Measurements L x W x D (cm) 0.3x0.1x0.2 N/A N/A Area (cm) : 0.024 N/A N/A Volume (cm) : 0.005 N/A N/A % Reduction in Area: 69.60% N/A N/A % Reduction in Volume: 79.20% N/A N/A Classification: Full Thickness Without Exposed N/A N/A Support Structures Exudate Amount: Medium N/A N/A Exudate Type: Serosanguineous N/A N/A Exudate Color: red, brown N/A N/A Granulation Amount: Small (1-33%) N/A N/A Granulation Quality: Red, Pink N/A N/A Treatment Notes Electronic Signature(s) Signed: 03/21/2022 1:10:36 PM By: Massie Kluver Entered By: Massie Kluver on 03/21/2022 07:58:03 [name], [name] ([id number]) -------------------------------------------------------------------------------- Multi-Disciplinary Care Plan Details Patient Name: [name] Ewing D. Date of Service: 03/21/2022 7:30 AM Medical Record  Number: [id number] Patient Account Number: [id number] Date of Birth/Sex: May 11, 1941 (81 y.o. M) Treating RN: Cornell Barman Primary Care Latissa Frick: [name] Other Clinician: Massie Kluver Referring Lonn Im: [name] Treating Galan Ghee/Extender: Skipper Cliche in Treatment: 9 Active Inactive Wound/Skin Impairment Nursing Diagnoses: Knowledge deficit related to ulceration/compromised skin integrity Goals: Patient/caregiver will verbalize understanding of skin care regimen Date Initiated: 01/16/2022 Target Resolution Date: 02/16/2022 Goal Status: Active Ulcer/skin breakdown will have a volume reduction of 30% by week 4 Date Initiated: 01/16/2022 Target Resolution Date: 02/16/2022 Goal Status: Active Ulcer/skin breakdown will have a volume reduction of 50% by week 8 Date Initiated: 01/16/2022 Target Resolution Date: 03/19/2022 Goal Status: Active Ulcer/skin breakdown will have a volume reduction of  80% by week 12 Date Initiated: 01/16/2022 Target Resolution Date: 04/18/2022 Goal Status: Active Ulcer/skin breakdown will heal within 14 weeks Date Initiated: 01/16/2022 Target Resolution Date: 05/19/2022 Goal Status: Active Interventions: Assess patient/caregiver ability to obtain necessary supplies Assess patient/caregiver ability to perform ulcer/skin care regimen upon admission and as needed Assess ulceration(s) every visit Notes: Electronic Signature(s) Signed: 03/21/2022 1:10:36 PM By: Massie Kluver Signed: 03/24/2022 8:05:30 AM By: Gretta Cool, BSN, RN, CWS, Kim RN, BSN Entered By: Massie Kluver on 03/21/2022 07:57:55 [name], [name] ([id number]) -------------------------------------------------------------------------------- Pain Assessment Details Patient Name: [name] Ewing D. Date of Service: 03/21/2022 7:30 AM Medical Record Number: [id number] Patient Account Number: [id number] Date of Birth/Sex: 1940/11/13 (81 y.o. M) Treating RN: Cornell Barman Primary Care Naftuli Dalsanto: [name] Other Clinician: Massie Kluver Referring Jessi Pitstick: [name] Treating Fredric Slabach/Extender: Skipper Cliche in Treatment: 9 Active Problems Location of Pain Severity and Description of Pain Patient Has Paino No Site Locations Pain Management and Medication Current Pain Management: Electronic Signature(s) Signed: 03/21/2022 1:10:36 PM By: Massie Kluver Signed: 03/24/2022 8:05:30 AM By: Gretta Cool, BSN, RN, CWS, Kim RN, BSN Entered By: Massie Kluver on 03/21/2022 07:50:57 [name], [name] ([id number]) -------------------------------------------------------------------------------- Patient/Caregiver Education Details Patient Name: [name] Ewing D. Date of Service: 03/21/2022 7:30 AM Medical Record Number: [id number] Patient Account Number: [id number] Date of Birth/Gender: 1940/12/23 (81 y.o. M) Treating RN: Cornell Barman Primary Care Physician: [name] Other Clinician: Massie Kluver Referring Physician: [name] Treating Physician/Extender: Skipper Cliche in Treatment: 9 Education Assessment Education Provided To: Patient Education Topics Provided Wound/Skin Impairment: Handouts: Other: continue wound care as directed Methods: Explain/Verbal Responses: State content correctly Electronic Signature(s) Signed: 03/21/2022 1:10:36 PM By: Massie Kluver Entered By: Massie Kluver on 03/21/2022 08:17:40 [name], [name] ([id number]) -------------------------------------------------------------------------------- Wound Assessment Details Patient Name: [name] Ewing D. Date of Service: 03/21/2022 7:30 AM Medical Record Number: [id number] Patient Account Number: [id number] Date of Birth/Sex: 1940/07/10 (81 y.o. M) Treating RN: Cornell Barman Primary Care Esraa Seres: [name] Other Clinician: Massie Kluver Referring Sandford Diop: [name] Treating Alondra Sahni/Extender: [name] Weeks in Treatment: 9 Wound Status Wound Number: 2 Primary Etiology: Neuropathic  Ulcer-Non Diabetic Wound Location: Right, Plantar Foot Wound Status: Open Wounding Event: Gradually Appeared Comorbid History: Congestive Heart Failure, Hypertension Date Acquired: 12/28/2020 Weeks Of Treatment: 9 Clustered Wound: No Photos Wound Measurements Length: (cm) 0.3 Width: (cm) 0.1 Depth: (cm) 0.2 Area: (cm) 0.024 Volume: (cm) 0.005 % Reduction in Area: 69.6% % Reduction in Volume: 79.2% Wound Description Classification: Full Thickness Without Exposed Support Structures Exudate Amount: Medium Exudate Type: Serosanguineous Exudate Color: red, brown Foul Odor After Cleansing: No Slough/Fibrino No Wound Bed Granulation Amount: Small (1-33%) Granulation Quality: Red, Pink Treatment Notes Wound #2 (Foot) Wound Laterality: Plantar, Right Cleanser Wound Cleanser Discharge Instruction: Wash your hands with soap and water. Remove old dressing, discard into plastic bag and place into trash. Cleanse the wound with Wound Cleanser prior to applying a clean dressing using gauze sponges, not tissues or cotton balls. Do not scrub or use excessive force. Pat dry using gauze sponges, not tissue or cotton balls. Peri-Wound Care Topical Primary Dressing Silvercel Small 2x2 (in/in) [name], [name] ([id number]) Discharge Instruction: Apply Silvercel Small 2x2 (in/in) as instructed Secondary Dressing Zetuvit Plus 4x8 (in/in) Secured With Medipore Tape - 43M Medipore H Soft Cloth Surgical Tape, 2x2 (in/yd) Conform 4'' - Conforming Stretch Gauze Bandage 4x75 (in/in) Discharge Instruction: Apply as directed Tubigrip Size E, 3.5x10 (in/yds) Discharge Instruction: Apply 3 Tubigrip E 3-finger-widths below knee to base of toes  to secure dressing and/or for swelling. Compression Wrap Compression Stockings Add-Ons Electronic Signature(s) Signed: 03/21/2022 1:10:36 PM By: Massie Kluver Signed: 03/24/2022 8:05:30 AM By: Gretta Cool, BSN, RN, CWS, Kim RN, BSN Entered By: Massie Kluver on  03/21/2022 07:56:58 [name], Brode D. ([id number]) -------------------------------------------------------------------------------- Vitals Details Patient Name: [name] Ewing D. Date of Service: 03/21/2022 7:30 AM Medical Record Number: [id number] Patient Account Number: [id number] Date of Birth/Sex: 1941-03-10 (81 y.o. M) Treating RN: Cornell Barman Primary Care Montserrat Shek: [name] Other Clinician: Massie Kluver Referring Ever Gustafson: [name] Treating Matisha Termine/Extender: Skipper Cliche in Treatment: 9 Vital Signs Time Taken: 07:48 Temperature (F): 98.1 Height (in): 74 Pulse (bpm): 75 Weight (lbs): 244 Respiratory Rate (breaths/min): 18 Body Mass Index (BMI): 31.3 Blood Pressure (mmHg): 144/53 Reference Range: 80 - 120 mg / dl Electronic Signature(s) Signed: 03/21/2022 1:10:36 PM By: Massie Kluver Entered By: Massie Kluver on 03/21/2022 07:50:54

## 2022-03-27 NOTE — Progress Notes (Signed)
Subjective:    Patient ID: [name] Friends Sr., male    DOB: January 08, 1941, 81 y.o.   MRN: [id number] No chief complaint on file.   [name] is a 81 year old male who presents today as a referral from [facility], [place] in regards to a abnormal ABIs/healing wound.  The patient had bilateral wounds since June.  The left side has healed and the right side is proceeding slowly.  The patient had an ABI 0.76 on the right with a TBI 0.41.  The patient had biphasic/triphasic tibial artery waveforms.  The left has an ABI of 0.79 with a TBI of 0.16.  He has monophasic waveforms in the left lower extremity.  Bilateral common femoral arteries have triphasic waveforms additional duplex.    Review of Systems  Skin:  Positive for wound.  All other systems reviewed and are negative.      Objective:   Physical Exam Vitals reviewed.  HENT:     Head: Normocephalic.  Cardiovascular:     Rate and Rhythm: Normal rate.     Pulses:          Dorsalis pedis pulses are detected w/ Doppler on the right side and detected w/ Doppler on the left side.       Posterior tibial pulses are detected w/ Doppler on the right side and detected w/ Doppler on the left side.  Pulmonary:     Effort: Pulmonary effort is normal.  Skin:    General: Skin is warm and dry.  Neurological:     Mental Status: He is alert and oriented to person, place, and time.  Psychiatric:        Mood and Affect: Mood normal.        Behavior: Behavior normal.        Thought Content: Thought content normal.        Judgment: Judgment normal.     There were no vitals taken for this visit.  Past Medical History:  Diagnosis Date   Allergy    History of chicken pox    Hyperlipidemia    Hypertension     Social History   Socioeconomic History   Marital status: Widowed    Spouse name: Not on file   Number of children: 2   Years of education: Not on file   Highest education level: 12th grade  Occupational History   Occupation:  Retired/ Disabled  Tobacco Use   Smoking status: Former    Packs/day: 2.00    Years: 50.00    Total pack years: 100.00    Types: Cigarettes    Quit date: 07/01/2007    Years since quitting: 14.7   Smokeless tobacco: Never  Vaping Use   Vaping Use: Never used  Substance and Sexual Activity   Alcohol use: No    Alcohol/week: 0.0 standard drinks of alcohol   Drug use: No   Sexual activity: Not on file  Other Topics Concern   Not on file  Social History Narrative   Not on file   Social Determinants of Health   Financial Resource Strain: Low Risk  (09/02/2021)   Overall Financial Resource Strain (CARDIA)    Difficulty of Paying Living Expenses: Not hard at all  Food Insecurity: No Food Insecurity (09/02/2021)   Hunger Vital Sign    Worried About Running Out of Food in the Last Year: Never true    Ran Out of Food in the Last Year: Never true  Transportation Needs: No Transportation Needs (  09/02/2021)   PRAPARE - Hydrologist (Medical): No    Lack of Transportation (Non-Medical): No  Physical Activity: Inactive (09/02/2021)   Exercise Vital Sign    Days of Exercise per Week: 0 days    Minutes of Exercise per Session: 0 min  Stress: No Stress Concern Present (09/02/2021)   [place]    Feeling of Stress : Not at all  Social Connections: Socially Isolated (09/02/2021)   Social Connection and Isolation Panel [NHANES]    Frequency of Communication with Friends and Family: More than three times a week    Frequency of Social Gatherings with Friends and Family: Once a week    Attends Religious Services: Never    Marine scientist or Organizations: No    Attends Archivist Meetings: Never    Marital Status: Widowed  Intimate Partner Violence: Not At Risk (09/02/2021)   Humiliation, Afraid, Rape, and Kick questionnaire    Fear of Current or Ex-Partner: No    Emotionally Abused: No     Physically Abused: No    Sexually Abused: No    Past Surgical History:  Procedure Laterality Date   BACK SURGERY     x5   DOPPLER ECHOCARDIOGRAPHY  06/21/2013   EF=35% while in Sinus Rhythm   Myocardial perfusion scan  10/2012   severe global LV enlargement. Mild RV enlargement. Mild LVH. Mild mitral and tricuspid insufficency   polyp excision  09/2005   UPPER GASTROINTESTINAL ENDOSCOPY  03/27/2004   Gastropathy, Gastritis; Duodenopathy    Family History  Problem Relation Age of Onset   Alzheimer's disease Mother    Heart attack Father    Colon cancer Brother     No Known Allergies     Latest Ref Rng & Units 01/07/2022    9:25 AM 11/26/2021   10:38 AM 10/28/2021    5:12 PM  CBC  WBC 3.4 - 10.8 x10E3/uL 7.2  8.8  10.0   Hemoglobin 13.0 - 17.7 g/dL 11.3  12.4  13.6   Hematocrit 37.5 - 51.0 % 34.2  37.8  41.8   Platelets 150 - 450 x10E3/uL 226  275  250       CMP     Component Value Date/Time   NA 140 01/07/2022 0925   K 4.7 01/07/2022 0925   CL 100 01/07/2022 0925   CO2 23 01/07/2022 0925   GLUCOSE 87 01/07/2022 0925   GLUCOSE 108 (H) 11/26/2021 1038   BUN 32 (H) 01/07/2022 0925   CREATININE 1.84 (H) 01/07/2022 0925   CREATININE 1.71 (H) 03/19/2017 0813   CALCIUM 9.6 01/07/2022 0925   PROT 6.3 01/07/2022 0925   ALBUMIN 3.8 01/07/2022 0925   AST 18 01/07/2022 0925   ALT 15 01/07/2022 0925   ALKPHOS 109 01/07/2022 0925   BILITOT 0.8 01/07/2022 0925   GFRNONAA 45 (L) 11/26/2021 1038   GFRNONAA 39 (L) 03/19/2017 0813   GFRAA 39 (L) 04/04/2020 0808   GFRAA 45 (L) 03/19/2017 0813     No results found.     Assessment & Plan:   1. Ulcer of right lower extremity, limited to breakdown of skin ([place]) The patient's lower extremity ABIs show that the left lower extremity is actually much more affected than the right.  However the wounds have healed on the left foot versus the right.  Based on the noninvasive studies the patient should have adequate ability for  wound  healing.  We discussed that in cases that the wound still continues not heal despite the studies and angiogram is typically the best course of nature.  Following this discussion, we will follow with the patient conservatively and we will have him follow-up in 2 months to repeat noninvasive studies and discuss possibility of angiogram if wound has not healed.   Current Outpatient Medications on File Prior to Visit  Medication Sig Dispense Refill   amiodarone (PACERONE) 200 MG tablet Take 1 tablet (200 mg total) by mouth daily. 90 tablet 3   fluticasone (FLONASE) 50 MCG/ACT nasal spray Place 2 sprays into both nostrils daily. 16 g 6   gabapentin (NEURONTIN) 300 MG capsule Take 1 capsule (300 mg total) by mouth 2 (two) times daily. 60 capsule 5   levothyroxine (SYNTHROID) 100 MCG tablet TAKE 1 TABLET(100 MCG) BY MOUTH DAILY BEFORE BREAKFAST 90 tablet 3   lisinopril (ZESTRIL) 2.5 MG tablet Take 1 tablet (2.5 mg total) by mouth daily. 90 tablet 0   lovastatin (MEVACOR) 20 MG tablet Take 1 tablet (20 mg total) by mouth daily. 90 tablet 3   metoprolol succinate (TOPROL-XL) 25 MG 24 hr tablet 1 TABLET BY MOUTH ONCE A DAY 90 tablet 3   torsemide (DEMADEX) 20 MG tablet Take by mouth in the morning and at bedtime.     traMADol (ULTRAM) 50 MG tablet Take 50 mg by mouth 2 (two) times daily as needed.     XARELTO 20 MG TABS tablet Take 1 tablet (20 mg total) by mouth daily. 90 tablet 3   No current facility-administered medications on file prior to visit.    There are no Patient Instructions on file for this visit. No follow-ups on file.   Kris Hartmann, NP

## 2022-03-28 ENCOUNTER — Encounter (INDEPENDENT_AMBULATORY_CARE_PROVIDER_SITE_OTHER): Payer: Self-pay | Admitting: Nurse Practitioner

## 2022-03-31 ENCOUNTER — Encounter: Payer: PPO | Attending: Physician Assistant | Admitting: Physician Assistant

## 2022-03-31 DIAGNOSIS — Z79899 Other long term (current) drug therapy: Secondary | ICD-10-CM | POA: Insufficient documentation

## 2022-03-31 DIAGNOSIS — R7303 Prediabetes: Secondary | ICD-10-CM | POA: Insufficient documentation

## 2022-03-31 DIAGNOSIS — Z7901 Long term (current) use of anticoagulants: Secondary | ICD-10-CM | POA: Insufficient documentation

## 2022-03-31 DIAGNOSIS — L97822 Non-pressure chronic ulcer of other part of left lower leg with fat layer exposed: Secondary | ICD-10-CM | POA: Insufficient documentation

## 2022-03-31 DIAGNOSIS — L97512 Non-pressure chronic ulcer of other part of right foot with fat layer exposed: Secondary | ICD-10-CM | POA: Diagnosis not present

## 2022-03-31 DIAGNOSIS — I5042 Chronic combined systolic (congestive) and diastolic (congestive) heart failure: Secondary | ICD-10-CM | POA: Insufficient documentation

## 2022-03-31 DIAGNOSIS — L97522 Non-pressure chronic ulcer of other part of left foot with fat layer exposed: Secondary | ICD-10-CM | POA: Insufficient documentation

## 2022-03-31 DIAGNOSIS — L97812 Non-pressure chronic ulcer of other part of right lower leg with fat layer exposed: Secondary | ICD-10-CM | POA: Diagnosis not present

## 2022-03-31 DIAGNOSIS — I2510 Atherosclerotic heart disease of native coronary artery without angina pectoris: Secondary | ICD-10-CM | POA: Insufficient documentation

## 2022-03-31 DIAGNOSIS — I420 Dilated cardiomyopathy: Secondary | ICD-10-CM | POA: Diagnosis not present

## 2022-03-31 DIAGNOSIS — I87333 Chronic venous hypertension (idiopathic) with ulcer and inflammation of bilateral lower extremity: Secondary | ICD-10-CM | POA: Diagnosis not present

## 2022-03-31 DIAGNOSIS — I130 Hypertensive heart and chronic kidney disease with heart failure and stage 1 through stage 4 chronic kidney disease, or unspecified chronic kidney disease: Secondary | ICD-10-CM | POA: Insufficient documentation

## 2022-03-31 DIAGNOSIS — I480 Paroxysmal atrial fibrillation: Secondary | ICD-10-CM | POA: Insufficient documentation

## 2022-03-31 DIAGNOSIS — G9009 Other idiopathic peripheral autonomic neuropathy: Secondary | ICD-10-CM | POA: Insufficient documentation

## 2022-03-31 DIAGNOSIS — N1832 Chronic kidney disease, stage 3b: Secondary | ICD-10-CM | POA: Insufficient documentation

## 2022-03-31 NOTE — Progress Notes (Addendum)
EDIBERTO, SENS ([id number]) Visit Report for 03/31/2022 Chief Complaint Document Details Patient Name: [name], [name]. Date of Service: 03/31/2022 8:30 AM Medical Record Number: [id number] Patient Account Number: [id number] Date of Birth/Sex: [birth year] (81 y.o. M) Treating RN: Cornell Barman Primary Care Provider: [name] Other Clinician: Referring Provider: [name] Treating Provider/Extender: Skipper Cliche in Treatment: 10 Information Obtained from: Patient Chief Complaint Multiple LE Ulcers Electronic Signature(s) Signed: 03/31/2022 8:31:01 AM By: Worthy Keeler PA-C Entered By: Worthy Keeler on 03/31/2022 08:31:01 Prom, Mihir D. ([id number]) -------------------------------------------------------------------------------- Debridement Details Patient Name: [name] Ewing D. Date of Service: 03/31/2022 8:30 AM Medical Record Number: [id number] Patient Account Number: [id number] Date of Birth/Sex: August 30, 1940 (81 y.o. M) Treating RN: Cornell Barman Primary Care Provider: [name] Other Clinician: Referring Provider: [name] Treating Provider/Extender: Skipper Cliche in Treatment: 10 Debridement Performed for Wound #2 Right,Plantar Foot Assessment: Performed By: Physician Tommie Sams., PA-C Debridement Type: Debridement Level of Consciousness (Pre- Awake and Alert procedure): Pre-procedure Verification/Time Out Yes - 08:57 Taken: Total Area Debrided (L x W): 0.4 (cm) x 0.2 (cm) = 0.08 (cm) Tissue and other material Viable, Non-Viable, Callus, Slough, Subcutaneous, Slough debrided: Level: Skin/Subcutaneous Tissue Debridement Description: Excisional Instrument: Curette Bleeding: Minimum Hemostasis Achieved: Pressure Response to Treatment: Procedure was tolerated well Level of Consciousness (Post- Awake and Alert procedure): Post Debridement Measurements of Total Wound Length: (cm) 0.4 Width: (cm) 0.3 Depth: (cm) 0.2 Volume: (cm)  0.019 Character of Wound/Ulcer Post Debridement: Stable Post Procedure Diagnosis Same as Pre-procedure Electronic Signature(s) Signed: 03/31/2022 1:10:05 PM By: Gretta Cool, BSN, RN, CWS, Kim RN, BSN Signed: 03/31/2022 5:13:16 PM By: Worthy Keeler PA-C Entered By: Gretta Cool, BSN, RN, CWS, Kim on 03/31/2022 08:59:32 [name], [name] ([id number]) -------------------------------------------------------------------------------- HPI Details Patient Name: [name] Ewing D. Date of Service: 03/31/2022 8:30 AM Medical Record Number: [id number] Patient Account Number: [id number] Date of Birth/Sex: [birth year] (81 y.o. M) Treating RN: Cornell Barman Primary Care Provider: [name] Other Clinician: Referring Provider: [name] Treating Provider/Extender: Skipper Cliche in Treatment: 10 History of Present Illness HPI Description: 01-16-2022 upon evaluation today patient presents for initial inspection here in the clinic concerning issues that he has been having with wounds over the bilateral lower extremities and bilateral feet. Fortunately there does not appear to be any signs of significant infection at this point which is great news. Unfortunately his ABIs were registering at 0.49 on the left and 0.57 on the right here in the clinic on the screening today. With that being said I do think that it may possibly be true that we need to get him to formal arterial studies in order to see how things really are showing up. And the patient's not in disagreement with this for that reason we are going to make that referral as well. With that being said he does have some significant past medical history items of note detailed below. Patient does have chronic kidney disease stage IIIb, coronary artery disease, long-term use of anticoagulant therapy, dilated cardiomyopathy. Proximal atrial fibrillation, he is on Xarelto long-term. He also has congestive heart failure, hypertension, peripheral neuropathy not related  to diabetes as he is only prediabetic with a hemoglobin A1c of 5.8, and chronic venous hypertension. He has recently seen Dr. [name] show his cardiologist who did discontinue the hydrochlorothiazide and the Lasix and felt that the patient was retaining more fluid than he should be. For that reason he was started on furosemide 20 mg twice daily. 7/27; patient with bilateral  lower extremity wounds. These are largely venous although he had very poor ABIs on screening test here. He has an appointment apparently tomorrow at [facility] vascular for arterial studies. He was not too keen on the latter although I think I emphasized the need to do this both in terms of dealing with the current wounds and being prepared for further issues down the road. We have been using silver alginate on the wounds and Tubigrip's edema control is better per our intake nurse 02-06-2022 upon evaluation today patient appears to be doing well with regard to his wound. Fortunately there is no signs of infection he is actually healing for the most part on the bilateral feet. His legs are also doing much better though he has 1 new area that popped up on the right leg. Fortunately there is no evidence of active infection of note his ABIs and TBI's were poor on the vascular screening on the 31st with the left being worse than the right. For that reason I am going to recommend he still continue to follow-up with vascular to discuss angioplasty/stenting. 8-124-23 upon evaluation today patient appears to be doing well currently in regard to his wound on the leg although he has several other blisters that are showing up at this point. I do believe that we need to try to do something to get his swelling under control this could be of utmost importance as far as getting this healed is concerned. 03-06-2022 upon evaluation today patient appears to be doing well currently in regard to his wounds. In fact the legs are drying up and seem to  be healing quite nicely the lateral wounds are still weeping a little bit about the medial side actually appears to be pretty much dry and closed. The plantar aspect of his foot still is draining a bit but again it seems to be doing much better which is great news.[name] 03-13-2022 upon evaluation patient appears to be almost completely healed. His leg is doing excellent and his foot is just about closed. With that being said he did see vascular and they actually discussed the possibility of doing an arteriogram/angiogram in order to see if anything needed to be the balloons were stented open. With that being said he decided not to do that since things are healing so well right now and he is going to consider that for future they plan to see him back in November. 03-21-2022 upon evaluation today patient appears to be doing a little worse in regard to his wound on the foot. The wound keeps seeming to pull together unfortunately which is causing a trapped some fluid is just not healing quite as well as would like to see. With that being said I am going to try to remove a bit more of the overgrowth callus tissue today and hopefully that can help get this to seal up much more effectively and quickly. Fortunately I see no signs of infection of the foot although the leg on the right does seem to show some signs of erythema today I do think we need to address that currently. This looks to be cellulitis. 03-31-2022 upon evaluation today patient appears to be doing better in regard to the cellulitis I am pleased in that regard he is still taking the antibiotics. Fortunately I do not see any evidence of active infection locally or systemically at this time which is great news. No fevers, chills, nausea, vomiting, or diarrhea. Electronic Signature(s) Signed: 03/31/2022 9:05:53 AM By: Joaquim Lai  III, Brieana Shimmin PA-C Entered By: Worthy Keeler on 03/31/2022 09:05:53 Dieckman, [name]  ([id number]) -------------------------------------------------------------------------------- Physical Exam Details Patient Name: [name], [name]. Date of Service: 03/31/2022 8:30 AM Medical Record Number: [id number] Patient Account Number: [id number] Date of Birth/Sex: 08-30-1940 (81 y.o. M) Treating RN: Cornell Barman Primary Care Provider: [name] Other Clinician: Referring Provider: [name] Treating Provider/Extender: Skipper Cliche in Treatment: 67 Constitutional Well-nourished and well-hydrated in no acute distress. Respiratory normal breathing without difficulty. Psychiatric this patient is able to make decisions and demonstrates good insight into disease process. Alert and Oriented x 3. pleasant and cooperative. Notes Upon inspection patient's wound bed actually showed signs of need for some sharp debridement I do feel like the infection is actually improved compared to where we were previous and this is good news. Overall I am extremely pleased with that. Nonetheless I do believe that the patient is still having some issues here with getting this completely closed. Peg assist offloading shoe could be beneficial. Electronic Signature(s) Signed: 03/31/2022 9:06:19 AM By: Worthy Keeler PA-C Entered By: Worthy Keeler on 03/31/2022 09:06:19 Banko, Adilson D. ([id number]) -------------------------------------------------------------------------------- Physician Orders Details Patient Name: [name] Ewing D. Date of Service: 03/31/2022 8:30 AM Medical Record Number: [id number] Patient Account Number: [id number] Date of Birth/Sex: April 03, 1941 (81 y.o. M) Treating RN: Cornell Barman Primary Care Provider: [name] Other Clinician: Referring Provider: [name] Treating Provider/Extender: Skipper Cliche in Treatment: 10 Verbal / Phone Orders: No Diagnosis Coding ICD-10 Coding Code Description [phone] Chronic venous hypertension (idiopathic) with ulcer and  inflammation of bilateral lower extremity L97.812 Non-pressure chronic ulcer of other part of right lower leg with fat layer exposed L97.822 Non-pressure chronic ulcer of other part of left lower leg with fat layer exposed G90.09 Other idiopathic peripheral autonomic neuropathy L97.522 Non-pressure chronic ulcer of other part of left foot with fat layer exposed L97.512 Non-pressure chronic ulcer of other part of right foot with fat layer exposed I10 Essential (primary) hypertension I50.42 Chronic combined systolic (congestive) and diastolic (congestive) heart failure I48.0 Paroxysmal atrial fibrillation I42.0 Dilated cardiomyopathy Z79.01 Long term (current) use of anticoagulants I25.10 Atherosclerotic heart disease of native coronary artery without angina pectoris N18.32 Chronic kidney disease, stage 3b Follow-up Appointments o Return Appointment in 1 week. o Nurse Visit as needed Bathing/ Shower/ Hygiene o May shower with wound dressing protected with water repellent cover or cast protector. Anesthetic (Use 'Patient Medications' Section for Anesthetic Order Entry) o Lidocaine applied to wound bed Edema Control - Lymphedema / Segmental Compressive Device / Other o Tubigrip double layer applied - tubi d double layer o Elevate, Exercise Daily and Avoid Standing for Long Periods of Time. o Elevate legs to the level of the heart and pump ankles as often as possible o Elevate leg(s) parallel to the floor when sitting. Off-Loading Right Lower Extremity o Open toe surgical shoe with peg assist. Medications-Please add to medication list. o P.O. Antibiotics - continue Bactrim DS Wound Treatment Wound #2 - Foot Wound Laterality: Plantar, Right Cleanser: Wound Cleanser 3 x Per Week/30 Days Discharge Instructions: Wash your hands with soap and water. Remove old dressing, discard into plastic bag and place into trash. Cleanse the wound with Wound Cleanser prior to applying a  clean dressing using gauze sponges, not tissues or cotton balls. Do not scrub or use excessive force. Pat dry using gauze sponges, not tissue or cotton balls. Primary Dressing: Prisma 4.34 (in) 3 x Per Week/30 Days Discharge Instructions: Moisten w/normal saline or  sterile water; Cover wound as directed. Do not remove from wound bed. Secondary Dressing: Zetuvit Plus 4x8 (in/in) 3 x Per Week/30 Days Giannelli, [name] ([id number]) Secured With: Tubigrip Size E, 3.5x10 (in/yds) 3 x Per Week/30 Days Discharge Instructions: Apply 3 Tubigrip E 3-finger-widths below knee to base of toes to secure dressing and/or for swelling. Electronic Signature(s) Signed: 03/31/2022 1:10:05 PM By: Gretta Cool, BSN, RN, CWS, Kim RN, BSN Signed: 03/31/2022 5:13:16 PM By: Worthy Keeler PA-C Entered By: Gretta Cool BSN, RN, CWS, Kim on 03/31/2022 09:04:36 Papadopoulos, [name] ([id number]) -------------------------------------------------------------------------------- Problem List Details Patient Name: [name], [name] Date of Service: 03/31/2022 8:30 AM Medical Record Number: [id number] Patient Account Number: [id number] Date of Birth/Sex: June 21, 1941 (81 y.o. M) Treating RN: Cornell Barman Primary Care Provider: [name] Other Clinician: Referring Provider: [name] Treating Provider/Extender: Skipper Cliche in Treatment: 10 Active Problems ICD-10 Encounter Code Description Active Date MDM Diagnosis I87.333 Chronic venous hypertension (idiopathic) with ulcer and inflammation of 01/16/2022 No Yes bilateral lower extremity L97.812 Non-pressure chronic ulcer of other part of right lower leg with fat layer 01/16/2022 No Yes exposed L97.822 Non-pressure chronic ulcer of other part of left lower leg with fat layer 01/16/2022 No Yes exposed G90.09 Other idiopathic peripheral autonomic neuropathy 01/16/2022 No Yes L97.522 Non-pressure chronic ulcer of other part of left foot with fat layer 01/16/2022 No Yes exposed L97.512  Non-pressure chronic ulcer of other part of right foot with fat layer 01/16/2022 No Yes exposed [place] (primary) hypertension 01/16/2022 No Yes I50.42 Chronic combined systolic (congestive) and diastolic (congestive) heart 01/16/2022 No Yes failure I48.0 Paroxysmal atrial fibrillation 01/16/2022 No Yes I42.0 Dilated cardiomyopathy 01/16/2022 No Yes Z79.01 Long term (current) use of anticoagulants 01/16/2022 No Yes I25.10 Atherosclerotic heart disease of native coronary artery without angina 01/16/2022 No Yes pectoris Hilley, [name] ([id number]) N18.32 Chronic kidney disease, stage 3b 01/16/2022 No Yes Inactive Problems Resolved Problems Electronic Signature(s) Signed: 03/31/2022 8:30:56 AM By: Worthy Keeler PA-C Entered By: Worthy Keeler on 03/31/2022 08:30:56 Slifer, [name] ([id number]) -------------------------------------------------------------------------------- Progress Note Details Patient Name: [name] Ewing D. Date of Service: 03/31/2022 8:30 AM Medical Record Number: [id number] Patient Account Number: [id number] Date of Birth/Sex: 1940-10-13 (81 y.o. M) Treating RN: Cornell Barman Primary Care Provider: [name] Other Clinician: Referring Provider: [name] Treating Provider/Extender: Skipper Cliche in Treatment: 10 Subjective Chief Complaint Information obtained from Patient Multiple LE Ulcers History of Present Illness (HPI) 01-16-2022 upon evaluation today patient presents for initial inspection here in the clinic concerning issues that he has been having with wounds over the bilateral lower extremities and bilateral feet. Fortunately there does not appear to be any signs of significant infection at this point which is great news. Unfortunately his ABIs were registering at 0.49 on the left and 0.57 on the right here in the clinic on the screening today. With that being said I do think that it may possibly be true that we need to get him to formal  arterial studies in order to see how things really are showing up. And the patient's not in disagreement with this for that reason we are going to make that referral as well. With that being said he does have some significant past medical history items of note detailed below. Patient does have chronic kidney disease stage IIIb, coronary artery disease, long-term use of anticoagulant therapy, dilated cardiomyopathy. Proximal atrial fibrillation, he is on Xarelto long-term. He also has congestive heart failure, hypertension, peripheral neuropathy not related to  diabetes as he is only prediabetic with a hemoglobin A1c of 5.8, and chronic venous hypertension. He has recently seen Dr. [name] show his cardiologist who did discontinue the hydrochlorothiazide and the Lasix and felt that the patient was retaining more fluid than he should be. For that reason he was started on furosemide 20 mg twice daily. 7/27; patient with bilateral lower extremity wounds. These are largely venous although he had very poor ABIs on screening test here. He has an appointment apparently tomorrow at [facility] for arterial studies. He was not too keen on the latter although I think I emphasized the need to do this both in terms of dealing with the current wounds and being prepared for further issues down the road. We have been using silver alginate on the wounds and Tubigrip's edema control is better per our intake nurse 02-06-2022 upon evaluation today patient appears to be doing well with regard to his wound. Fortunately there is no signs of infection he is actually healing for the most part on the bilateral feet. His legs are also doing much better though he has 1 new area that popped up on the right leg. Fortunately there is no evidence of active infection of note his ABIs and TBI's were poor on the vascular screening on the 31st with the left being worse than the right. For that reason I am going to recommend he still  continue to follow-up with vascular to discuss angioplasty/stenting. 8-124-23 upon evaluation today patient appears to be doing well currently in regard to his wound on the leg although he has several other blisters that are showing up at this point. I do believe that we need to try to do something to get his swelling under control this could be of utmost importance as far as getting this healed is concerned. 03-06-2022 upon evaluation today patient appears to be doing well currently in regard to his wounds. In fact the legs are drying up and seem to be healing quite nicely the lateral wounds are still weeping a little bit about the medial side actually appears to be pretty much dry and closed. The plantar aspect of his foot still is draining a bit but again it seems to be doing much better which is great news.[name] 03-13-2022 upon evaluation patient appears to be almost completely healed. His leg is doing excellent and his foot is just about closed. With that being said he did see vascular and they actually discussed the possibility of doing an arteriogram/angiogram in order to see if anything needed to be the balloons were stented open. With that being said he decided not to do that since things are healing so well right now and he is going to consider that for future they plan to see him back in November. 03-21-2022 upon evaluation today patient appears to be doing a little worse in regard to his wound on the foot. The wound keeps seeming to pull together unfortunately which is causing a trapped some fluid is just not healing quite as well as would like to see. With that being said I am going to try to remove a bit more of the overgrowth callus tissue today and hopefully that can help get this to seal up much more effectively and quickly. Fortunately I see no signs of infection of the foot although the leg on the right does seem to show some signs of erythema today I do think we need to address that  currently. This looks to be  cellulitis. 03-31-2022 upon evaluation today patient appears to be doing better in regard to the cellulitis I am pleased in that regard he is still taking the antibiotics. Fortunately I do not see any evidence of active infection locally or systemically at this time which is great news. No fevers, chills, nausea, vomiting, or diarrhea. Objective Constitutional Fendrick, [name] ([id number]) Well-nourished and well-hydrated in no acute distress. Vitals Time Taken: 8:40 AM, Height: 74 in, Weight: 244 lbs, BMI: 31.3, Temperature: 98.0 F, Pulse: 55 bpm, Respiratory Rate: 18 breaths/min, Blood Pressure: 109/44 mmHg. Respiratory normal breathing without difficulty. Psychiatric this patient is able to make decisions and demonstrates good insight into disease process. Alert and Oriented x 3. pleasant and cooperative. General Notes: Upon inspection patient's wound bed actually showed signs of need for some sharp debridement I do feel like the infection is actually improved compared to where we were previous and this is good news. Overall I am extremely pleased with that. Nonetheless I do believe that the patient is still having some issues here with getting this completely closed. Peg assist offloading shoe could be beneficial. Integumentary (Hair, Skin) Wound #2 status is Open. Original cause of wound was Gradually Appeared. The date acquired was: 12/28/2020. The wound has been in treatment 10 weeks. The wound is located on the [place]. The wound measures 0.3cm length x 0.1cm width x 0.2cm depth; 0.024cm^2 area and 0.005cm^3 volume. There is no tunneling noted, however, there is undermining starting at 9:00 and ending at 3:00 with a maximum distance of 0.2cm. There is a medium amount of serosanguineous drainage noted. There is no granulation within the wound bed. There is no necrotic tissue within the wound bed. General Notes: callus surrounding  wound Assessment Active Problems ICD-10 Chronic venous hypertension (idiopathic) with ulcer and inflammation of bilateral lower extremity Non-pressure chronic ulcer of other part of right lower leg with fat layer exposed Non-pressure chronic ulcer of other part of left lower leg with fat layer exposed Other idiopathic peripheral autonomic neuropathy Non-pressure chronic ulcer of other part of left foot with fat layer exposed Non-pressure chronic ulcer of other part of right foot with fat layer exposed Essential (primary) hypertension Chronic combined systolic (congestive) and diastolic (congestive) heart failure Paroxysmal atrial fibrillation Dilated cardiomyopathy Long term (current) use of anticoagulants Atherosclerotic heart disease of native coronary artery without angina pectoris Chronic kidney disease, stage 3b Procedures Wound #2 Pre-procedure diagnosis of Wound #2 is a Neuropathic Ulcer-Non Diabetic located on the [place] . There was a Excisional Skin/Subcutaneous Tissue Debridement with a total area of 0.08 sq cm performed by Tommie Sams., PA-C. With the following instrument(s): Curette to remove Viable and Non-Viable tissue/material. Material removed includes Callus, Subcutaneous Tissue, and Slough. No specimens were taken. A time out was conducted at 08:57, prior to the start of the procedure. A Minimum amount of bleeding was controlled with Pressure. The procedure was tolerated well. Post Debridement Measurements: 0.4cm length x 0.3cm width x 0.2cm depth; 0.019cm^3 volume. Character of Wound/Ulcer Post Debridement is stable. Post procedure Diagnosis Wound #2: Same as Pre-Procedure Plan Follow-up Appointments: Return Appointment in 1 week. Nurse Visit as needed Bathing/ Shower/ Hygiene: May shower with wound dressing protected with water repellent cover or cast protector. [name], [name] ([id number]) Anesthetic (Use 'Patient Medications' Section for  Anesthetic Order Entry): Lidocaine applied to wound bed Edema Control - Lymphedema / Segmental Compressive Device / Other: Tubigrip double layer applied - tubi d double layer Elevate, Exercise Daily and Avoid  Standing for Long Periods of Time. Elevate legs to the level of the heart and pump ankles as often as possible Elevate leg(s) parallel to the floor when sitting. Off-Loading: Open toe surgical shoe with peg assist. Medications-Please add to medication list.: P.O. Antibiotics - continue Bactrim DS WOUND #2: - Foot Wound Laterality: Plantar, Right Cleanser: Wound Cleanser 3 x Per Week/30 Days Discharge Instructions: Wash your hands with soap and water. Remove old dressing, discard into plastic bag and place into trash. Cleanse the wound with Wound Cleanser prior to applying a clean dressing using gauze sponges, not tissues or cotton balls. Do not scrub or use excessive force. Pat dry using gauze sponges, not tissue or cotton balls. Primary Dressing: Prisma 4.34 (in) 3 x Per Week/30 Days Discharge Instructions: Moisten w/normal saline or sterile water; Cover wound as directed. Do not remove from wound bed. Secondary Dressing: Zetuvit Plus 4x8 (in/in) 3 x Per Week/30 Days Secured With: Tubigrip Size E, 3.5x10 (in/yds) 3 x Per Week/30 Days Discharge Instructions: Apply 3 Tubigrip E 3-finger-widths below knee to base of toes to secure dressing and/or for swelling. 1. Based on what I am seeing I am going to suggest we switch to silver collagen which I think to do excellent for him. 2. I am also can recommend that we have the patient switch to using a peg assist offloading shoe. I am also can recommend the Zetuvit along with Tubigrip to help with his leg edema. 3. I am also can recommend the patient should continue to monitor for any signs of worsening infection though right now he seems to be doing quite well very pleased with what we see and I do believe that the antibiotic is doing a good  job here. We will see patient back for reevaluation in 1 week here in the clinic. If anything worsens or changes patient will contact our office for additional recommendations. Electronic Signature(s) Signed: 03/31/2022 9:07:02 AM By: Worthy Keeler PA-C Entered By: Worthy Keeler on 03/31/2022 09:07:02 [name], [name] ([id number]) -------------------------------------------------------------------------------- SuperBill Details Patient Name: [name] Ewing D. Date of Service: 03/31/2022 Medical Record Number: [id number] Patient Account Number: [id number] Date of Birth/Sex: Oct 05, 1940 (81 y.o. M) Treating RN: Cornell Barman Primary Care Provider: [name] Other Clinician: Referring Provider: [name] Treating Provider/Extender: Skipper Cliche in Treatment: 10 Diagnosis Coding ICD-10 Codes Code Description [id number] Chronic venous hypertension (idiopathic) with ulcer and inflammation of bilateral lower extremity L97.812 Non-pressure chronic ulcer of other part of right lower leg with fat layer exposed L97.822 Non-pressure chronic ulcer of other part of left lower leg with fat layer exposed G90.09 Other idiopathic peripheral autonomic neuropathy L97.522 Non-pressure chronic ulcer of other part of left foot with fat layer exposed L97.512 Non-pressure chronic ulcer of other part of right foot with fat layer exposed I10 Essential (primary) hypertension I50.42 Chronic combined systolic (congestive) and diastolic (congestive) heart failure I48.0 Paroxysmal atrial fibrillation I42.0 Dilated cardiomyopathy Z79.01 Long term (current) use of anticoagulants I25.10 Atherosclerotic heart disease of native coronary artery without angina pectoris N18.32 Chronic kidney disease, stage 3b Facility Procedures CPT4 Code: 21308657 Description: 11042 - DEB SUBQ TISSUE 20 SQ CM/< Modifier: Quantity: 1 CPT4 Code: Description: ICD-10 Diagnosis Description L97.512 Non-pressure chronic ulcer of  other part of right foot with fat layer ex Modifier: posed Quantity: Physician Procedures CPT4 Code: 8469629 Description: 11042 - WC PHYS SUBQ TISS 20 SQ CM Modifier: Quantity: 1 CPT4 Code: Description: ICD-10 Diagnosis Description L97.512 Non-pressure chronic ulcer of other part of  right foot with fat layer ex Modifier: posed Quantity: Electronic Signature(s) Signed: 03/31/2022 9:07:18 AM By: Worthy Keeler PA-C Entered By: Worthy Keeler on 03/31/2022 09:07:17

## 2022-03-31 NOTE — Progress Notes (Addendum)
TILMON, WISEHART ([id number]) Visit Report for 03/31/2022 Arrival Information Details Patient Name: [name], [name]. Date of Service: 03/31/2022 8:30 AM Medical Record Number: [id number] Patient Account Number: [id number] Date of Birth/Sex: 08-Dec-1940 (81 y.o. M) Treating RN: Cornell Barman Primary Care Sabina Beavers: [name] Other Clinician: Referring Mykaylah Ballman: [name] Treating Kiela Shisler/Extender: Skipper Cliche in Treatment: 10 Visit Information History Since Last Visit Added or deleted any medications: No Patient Arrived: Walker Pain Present Now: No Arrival Time: 08:34 Accompanied By: self Transfer Assistance: None Patient Identification Verified: Yes Secondary Verification Process Completed: Yes Patient Requires Transmission-Based Precautions: No Patient Has Alerts: No Electronic Signature(s) Signed: 03/31/2022 1:10:05 PM By: Gretta Cool, BSN, RN, CWS, Kim RN, BSN Entered By: Gretta Cool, BSN, RN, CWS, Kim on 03/31/2022 08:40:11 [name] ([id number]) -------------------------------------------------------------------------------- Encounter Discharge Information Details Patient Name: [name] Ewing D. Date of Service: 03/31/2022 8:30 AM Medical Record Number: [id number] Patient Account Number: [id number] Date of Birth/Sex: 1941/06/16 (81 y.o. M) Treating RN: Cornell Barman Primary Care Ysmael Hires: [name] Other Clinician: Referring Khia Dieterich: [name] Treating Dez Stauffer/Extender: Skipper Cliche in Treatment: 10 Encounter Discharge Information Items Post Procedure Vitals Discharge Condition: Stable Temperature (F): 98 Ambulatory Status: Ambulatory Pulse (bpm): 55 Discharge Destination: Home Respiratory Rate (breaths/min): 16 Transportation: Private Auto Blood Pressure (mmHg): 109/44 Accompanied By: self Schedule Follow-up Appointment: Yes Clinical Summary of Care: Electronic Signature(s) Signed: 03/31/2022 1:10:05 PM By: Gretta Cool, BSN, RN, CWS, Kim RN, BSN Entered  By: Gretta Cool, BSN, RN, CWS, Kim on 03/31/2022 09:15:35 [name] ([id number]) -------------------------------------------------------------------------------- Lower Extremity Assessment Details Patient Name: [name], [name] Date of Service: 03/31/2022 8:30 AM Medical Record Number: [id number] Patient Account Number: [id number] Date of Birth/Sex: 08-21-1940 (81 y.o. M) Treating RN: Cornell Barman Primary Care Janautica Netzley: [name] Other Clinician: Referring Shain Pauwels: [name] Treating Shahzad Thomann/Extender: [name] Weeks in Treatment: 10 Edema Assessment Assessed: [Left: No] [Right: No] Edema: [Left: Ye] [Right: s] Calf Left: Right: Point of Measurement: 38 cm From Medial Instep 42.4 cm Ankle Left: Right: Point of Measurement: 15 cm From Medial Instep 28.5 cm Vascular Assessment Pulses: Dorsalis Pedis Palpable: [Right:Yes] Electronic Signature(s) Signed: 03/31/2022 1:10:05 PM By: Gretta Cool, BSN, RN, CWS, Kim RN, BSN Entered By: Gretta Cool, BSN, RN, CWS, Kim on 03/31/2022 08:49:22 [name], [name] ([id number]) -------------------------------------------------------------------------------- Multi Wound Chart Details Patient Name: [name] Ewing D. Date of Service: 03/31/2022 8:30 AM Medical Record Number: [id number] Patient Account Number: [id number] Date of Birth/Sex: 01-Nov-1940 (81 y.o. M) Treating RN: Cornell Barman Primary Care Lori-Ann Lindfors: [name] Other Clinician: Referring Iven Earnhart: [name] Treating Karey Suthers/Extender: Skipper Cliche in Treatment: 10 Vital Signs Height(in): 74 Pulse(bpm): 55 Weight(lbs): 244 Blood Pressure(mmHg): 109/44 Body Mass Index(BMI): 31.3 Temperature(F): 98.0 Respiratory Rate(breaths/min): 18 Photos: [N/A:N/A] Wound Location: Right, Plantar Foot N/A N/A Wounding Event: Gradually Appeared N/A N/A Primary Etiology: Neuropathic Ulcer-Non Diabetic N/A N/A Comorbid History: Congestive Heart Failure, N/A N/A Hypertension Date Acquired:  12/28/2020 N/A N/A Weeks of Treatment: 10 N/A N/A Wound Status: Open N/A N/A Wound Recurrence: No N/A N/A Measurements L x W x D (cm) 0.3x0.1x0.2 N/A N/A Area (cm) : 0.024 N/A N/A Volume (cm) : 0.005 N/A N/A % Reduction in Area: 69.60% N/A N/A % Reduction in Volume: 79.20% N/A N/A Starting Position 1 (o'clock): 9 Ending Position 1 (o'clock): 3 Maximum Distance 1 (cm): 0.2 Undermining: Yes N/A N/A Classification: Full Thickness Without Exposed N/A N/A Support Structures Exudate Amount: Medium N/A N/A Exudate Type: Serosanguineous N/A N/A Exudate Color: red, brown N/A N/A Granulation Amount: None Present (0%) N/A  N/A Necrotic Amount: None Present (0%) N/A N/A Epithelialization: Medium (34-66%) N/A N/A Assessment Notes: callus surrounding wound N/A N/A Treatment Notes Electronic Signature(s) Signed: 03/31/2022 1:10:05 PM By: Gretta Cool, BSN, RN, CWS, Kim RN, BSN Entered By: Gretta Cool, BSN, RN, CWS, Kim on 03/31/2022 08:57:02 [name] ([id number]) -------------------------------------------------------------------------------- Multi-Disciplinary Care Plan Details Patient Name: [name], [name] Date of Service: 03/31/2022 8:30 AM Medical Record Number: [id number] Patient Account Number: [id number] Date of Birth/Sex: [birth year] (81 y.o. M) Treating RN: Cornell Barman Primary Care Minnie Legros: [name] Other Clinician: Referring Alonie Gazzola: [name] Treating Leotha Westermeyer/Extender: Skipper Cliche in Treatment: 10 Active Inactive Necrotic Tissue Nursing Diagnoses: Impaired tissue integrity related to necrotic/devitalized tissue Knowledge deficit related to management of necrotic/devitalized tissue Goals: Necrotic/devitalized tissue will be minimized in the wound bed Date Initiated: 03/31/2022 Target Resolution Date: 03/31/2022 Goal Status: Active Patient/caregiver will verbalize understanding of reason and process for debridement of necrotic tissue Date Initiated: 03/31/2022 Target  Resolution Date: 03/31/2022 Goal Status: Active Interventions: Assess patient pain level pre-, during and post procedure and prior to discharge Provide education on necrotic tissue and debridement process Treatment Activities: Excisional debridement : 03/31/2022 Notes: Wound/Skin Impairment Nursing Diagnoses: Knowledge deficit related to ulceration/compromised skin integrity Goals: Patient/caregiver will verbalize understanding of skin care regimen Date Initiated: 01/16/2022 Target Resolution Date: 02/16/2022 Goal Status: Active Ulcer/skin breakdown will have a volume reduction of 30% by week 4 Date Initiated: 01/16/2022 Target Resolution Date: 02/16/2022 Goal Status: Active Ulcer/skin breakdown will have a volume reduction of 50% by week 8 Date Initiated: 01/16/2022 Target Resolution Date: 03/19/2022 Goal Status: Active Ulcer/skin breakdown will have a volume reduction of 80% by week 12 Date Initiated: 01/16/2022 Target Resolution Date: 04/18/2022 Goal Status: Active Ulcer/skin breakdown will heal within 14 weeks Date Initiated: 01/16/2022 Target Resolution Date: 05/19/2022 Goal Status: Active Interventions: Assess patient/caregiver ability to obtain necessary supplies Assess patient/caregiver ability to perform ulcer/skin care regimen upon admission and as needed Assess ulceration(s) every visit Notes: [name], [name] ([id number]) Electronic Signature(s) Signed: 03/31/2022 1:10:05 PM By: Gretta Cool, BSN, RN, CWS, Kim RN, BSN Entered By: Gretta Cool, BSN, RN, CWS, Kim on 03/31/2022 08:56:53 [name], [name] ([id number]) -------------------------------------------------------------------------------- Pain Assessment Details Patient Name: [name] Ewing D. Date of Service: 03/31/2022 8:30 AM Medical Record Number: [id number] Patient Account Number: [id number] Date of Birth/Sex: [birth year] (81 y.o. M) Treating RN: Cornell Barman Primary Care Jalaiya Oyster: [name] Other Clinician: Referring  Kurt Azimi: [name] Treating Jaymond Waage/Extender: Skipper Cliche in Treatment: 10 Active Problems Location of Pain Severity and Description of Pain Patient Has Paino Yes Site Locations Pain Location: Generalized Pain Pain Management and Medication Current Pain Management: Notes General arthritis pain. Electronic Signature(s) Signed: 03/31/2022 1:10:05 PM By: Gretta Cool, BSN, RN, CWS, Kim RN, BSN Entered By: Gretta Cool, BSN, RN, CWS, Kim on 03/31/2022 08:41:13 [name] ([id number]) -------------------------------------------------------------------------------- Patient/Caregiver Education Details Patient Name: [name], [name] Date of Service: 03/31/2022 8:30 AM Medical Record Number: [id number] Patient Account Number: [id number] Date of Birth/Gender: [birth year] (81 y.o. M) Treating RN: Cornell Barman Primary Care Physician: [name] Other Clinician: Referring Physician: [name] Treating Physician/Extender: Skipper Cliche in Treatment: 10 Education Assessment Education Provided To: Patient Education Topics Provided Offloading: Handouts: What is Offloadingo Methods: Demonstration, Explain/Verbal Responses: State content correctly Wound Debridement: Handouts: Wound Debridement Methods: Demonstration, Explain/Verbal Responses: State content correctly Electronic Signature(s) Signed: 03/31/2022 1:10:05 PM By: Gretta Cool, BSN, RN, CWS, Kim RN, BSN Entered By: Gretta Cool, BSN, RN, CWS, Kim on 03/31/2022 09:14:50 [place], Orpah [name] ([id number]) -------------------------------------------------------------------------------- Wound Assessment Details  Patient Name: [name], SHIBATA. Date of Service: 03/31/2022 8:30 AM Medical Record Number: [id number] Patient Account Number: [id number] Date of Birth/Sex: 1940-08-24 (81 y.o. M) Treating RN: Cornell Barman Primary Care Wiley Magan: [name] Other Clinician: Referring Twylla Arceneaux: [name] Treating Rosamary Boudreau/Extender: [name] Weeks  in Treatment: 10 Wound Status Wound Number: 2 Primary Etiology: Neuropathic Ulcer-Non Diabetic Wound Location: Right, Plantar Foot Wound Status: Open Wounding Event: Gradually Appeared Comorbid History: Congestive Heart Failure, Hypertension Date Acquired: 12/28/2020 Weeks Of Treatment: 10 Clustered Wound: No Photos Wound Measurements Length: (cm) 0.3 Width: (cm) 0.1 Depth: (cm) 0.2 Area: (cm) 0.024 Volume: (cm) 0.005 % Reduction in Area: 69.6% % Reduction in Volume: 79.2% Epithelialization: Medium (34-66%) Tunneling: No Undermining: Yes Starting Position (o'clock): 9 Ending Position (o'clock): 3 Maximum Distance: (cm) 0.2 Wound Description Classification: Full Thickness Without Exposed Support Structures Exudate Amount: Medium Exudate Type: Serosanguineous Exudate Color: red, brown Foul Odor After Cleansing: No Slough/Fibrino No Wound Bed Granulation Amount: None Present (0%) Necrotic Amount: None Present (0%) Assessment Notes callus surrounding wound Treatment Notes Wound #2 (Foot) Wound Laterality: Plantar, Right Cleanser Wound Cleanser Discharge Instruction: Wash your hands with soap and water. Remove old dressing, discard into plastic bag and place into trash. Cleanse the wound with Wound Cleanser prior to applying a clean dressing using gauze sponges, not tissues or cotton balls. Do not Spelman, Uri D. ([id number]) scrub or use excessive force. Pat dry using gauze sponges, not tissue or cotton balls. Peri-Wound Care Topical Primary Dressing Prisma 4.34 (in) Discharge Instruction: Moisten w/normal saline or sterile water; Cover wound as directed. Do not remove from wound bed. Secondary Dressing Zetuvit Plus 4x8 (in/in) Secured With Tubigrip Size E, 3.5x10 (in/yds) Discharge Instruction: Apply 3 Tubigrip E 3-finger-widths below knee to base of toes to secure dressing and/or for swelling. Compression Wrap Compression Stockings Add-Ons Electronic  Signature(s) Signed: 03/31/2022 1:10:05 PM By: Gretta Cool, BSN, RN, CWS, Kim RN, BSN Entered By: Gretta Cool, BSN, RN, CWS, Kim on 03/31/2022 08:56:15 [name] ([id number]) -------------------------------------------------------------------------------- Vitals Details Patient Name: [name] Ewing D. Date of Service: 03/31/2022 8:30 AM Medical Record Number: [id number] Patient Account Number: [id number] Date of Birth/Sex: Jul 14, 1940 (81 y.o. M) Treating RN: Cornell Barman Primary Care Song Myre: [name] Other Clinician: Referring Yuji Walth: [name] Treating Rasheen Bells/Extender: Skipper Cliche in Treatment: 10 Vital Signs Time Taken: 08:40 Temperature (F): 98.0 Height (in): 74 Pulse (bpm): 55 Weight (lbs): 244 Respiratory Rate (breaths/min): 18 Body Mass Index (BMI): 31.3 Blood Pressure (mmHg): 109/44 Reference Range: 80 - 120 mg / dl Electronic Signature(s) Signed: 03/31/2022 1:10:05 PM By: Gretta Cool, BSN, RN, CWS, Kim RN, BSN Entered By: Gretta Cool, BSN, RN, CWS, Kim on 03/31/2022 08:40:45

## 2022-04-10 ENCOUNTER — Encounter: Payer: PPO | Admitting: Physician Assistant

## 2022-04-10 DIAGNOSIS — G9009 Other idiopathic peripheral autonomic neuropathy: Secondary | ICD-10-CM | POA: Diagnosis not present

## 2022-04-10 DIAGNOSIS — L97512 Non-pressure chronic ulcer of other part of right foot with fat layer exposed: Secondary | ICD-10-CM | POA: Diagnosis not present

## 2022-04-10 DIAGNOSIS — I87333 Chronic venous hypertension (idiopathic) with ulcer and inflammation of bilateral lower extremity: Secondary | ICD-10-CM | POA: Diagnosis not present

## 2022-04-10 NOTE — Progress Notes (Addendum)
[name] ([id number]) [id number] Page 1 of 9 Visit Report for 04/10/2022 Chief Complaint Document Details Patient Name: Date of Service: [name] [facility] [name]. 04/10/2022 10:30 A M Medical Record Number: [id number] Patient Account Number: [id number] Date of Birth/Sex: Treating RN: [birth year] (81 y.o. [name]) Carlene Coria Primary Care Provider: [name] Other Clinician: Massie Kluver Referring Provider: Treating Provider/Extender: [name] in Treatment: 12 Information Obtained from: Patient Chief Complaint Multiple LE Ulcers Electronic Signature(s) Signed: 04/10/2022 10:35:58 AM By: Worthy Keeler PA-C Entered By: Worthy Keeler on 04/10/2022 10:35:58 -------------------------------------------------------------------------------- Debridement Details Patient Name: Date of Service: [name] [facility] [name]. 04/10/2022 10:30 A M Medical Record Number: [id number] Patient Account Number: [id number] Date of Birth/Sex: Treating RN: 12-14-1940 (81 y.o. [name]) Carlene Coria Primary Care Provider: [name] Other Clinician: Massie Kluver Referring Provider: Treating Provider/Extender: [name] in Treatment: 12 Debridement Performed for Assessment: Wound #2 Right,Plantar Foot Performed By: Physician Tommie Sams., PA-C Debridement Type: Debridement Level of Consciousness (Pre-procedure): Awake and Alert Pre-procedure Verification/Time Out Yes - 10:47 Taken: Start Time: 10:47 T Area Debrided (L x W): otal 0.5 (cm) x 0.5 (cm) = 0.25 (cm) Tissue and other material debrided: Viable, Non-Viable, Callus Level: Non-Viable Tissue Debridement Description: Selective/Open Wound Instrument: Curette Bleeding: Minimum Hemostasis Achieved: Pressure End Time: 10:48 Response to Treatment: Procedure was tolerated well Level of Consciousness (Post- Awake and Alert procedure): [name], [name] ([id number])  [id number] Page 2 of 9 Post Debridement Measurements of Total Wound Length: (cm) 0.3 Width: (cm) 0.2 Depth: (cm) 0.1 Volume: (cm) 0.005 Character of Wound/Ulcer Post Debridement: Stable Post Procedure Diagnosis Same as Pre-procedure Electronic Signature(s) Signed: 04/10/2022 5:03:23 PM By: Worthy Keeler PA-C Signed: 04/11/2022 12:25:01 PM By: Carlene Coria RN Signed: 04/17/2022 11:24:03 AM By: Massie Kluver Entered By: Massie Kluver on 04/10/2022 10:48:25 -------------------------------------------------------------------------------- HPI Details Patient Name: Date of Service: [name], [name] [facility] [name]. 04/10/2022 10:30 A M Medical Record Number: [id number] Patient Account Number: [id number] Date of Birth/Sex: Treating RN: 06/25/1941 (81 y.o. Oval Linsey Primary Care Provider: [name] Other Clinician: Massie Kluver Referring Provider: Treating Provider/Extender: [name] in Treatment: 12 History of Present Illness HPI Description: 01-16-2022 upon evaluation today patient presents for initial inspection here in the clinic concerning issues that he has been having with wounds over the bilateral lower extremities and bilateral feet. Fortunately there does not appear to be any signs of significant infection at this point which is great news. Unfortunately his ABIs were registering at 0.49 on the left and 0.57 on the right here in the clinic on the screening today. With that being said I do think that it may possibly be true that we need to get him to formal arterial studies in order to see how things really are showing up. And the patient's not in disagreement with this for that reason we are going to make that referral as well. With that being said he does have some significant past medical history items of note detailed below. Patient does have chronic kidney disease stage IIIb, coronary artery disease, long-term use of  anticoagulant therapy, dilated cardiomyopathy. Proximal atrial fibrillation, he is on Xarelto long-term. He also has congestive heart failure, hypertension, peripheral neuropathy not related to diabetes as he is only prediabetic with a hemoglobin A1c of 5.8, and chronic venous hypertension. He has recently seen Dr. [name] show his cardiologist who did discontinue the hydrochlorothiazide and the Lasix and felt that the  patient was retaining more fluid than he should be. For that reason he was started on furosemide 20 mg twice daily. 7/27; patient with bilateral lower extremity wounds. These are largely venous although he had very poor ABIs on screening test here. He has an appointment apparently tomorrow at vein and vascular for arterial studies. He was not too keen on the latter although I think I emphasized the need to do this both in terms of dealing with the current wounds and being prepared for further issues down the road. We have been using silver alginate on the wounds and Tubigrip's edema control is better per our intake nurse 02-06-2022 upon evaluation today patient appears to be doing well with regard to his wound. Fortunately there is no signs of infection he is actually healing for the most part on the bilateral feet. His legs are also doing much better though he has 1 new area that popped up on the right leg. Fortunately there is no evidence of active infection of note his ABIs and TBI's were poor on the vascular screening on the 31st with the left being worse than the right. For that reason I am going to recommend he still continue to follow-up with vascular to discuss angioplasty/stenting. [id number] upon evaluation today patient appears to be doing well currently in regard to his wound on the leg although he has several other blisters that are showing up at this point. I do believe that we need to try to do something to get his swelling under control this could be of utmost importance as far  as getting this healed is concerned. 03-06-2022 upon evaluation today patient appears to be doing well currently in regard to his wounds. In fact the legs are drying up and seem to be healing quite nicely the lateral wounds are still weeping a little bit about the medial side actually appears to be pretty much dry and closed. The plantar aspect of his foot still is draining a bit but again it seems to be doing much better which is great news.[name] 03-13-2022 upon evaluation patient appears to be almost completely healed. His leg is doing excellent and his foot is just about closed. With that being said he did see vascular and they actually discussed the possibility of doing an arteriogram/angiogram in order to see if anything needed to be the balloons were stented open. With that being said he decided not to do that since things are healing so well right now and he is going to consider that for future they plan to see him back in November. 03-21-2022 upon evaluation today patient appears to be doing a little worse in regard to his wound on the foot. The wound keeps seeming to pull together unfortunately which is causing a trapped some fluid is just not healing quite as well as would like to see. With that being said I am going to try to remove a bit [name], [name] ([id number]) [id number] Page 3 of 9 more of the overgrowth callus tissue today and hopefully that can help get this to seal up much more effectively and quickly. Fortunately I see no signs of infection of the foot although the leg on the right does seem to show some signs of erythema today I do think we need to address that currently. This looks to be cellulitis. 03-31-2022 upon evaluation today patient appears to be doing better in regard to the cellulitis I am pleased in that regard he is still taking the antibiotics. Fortunately  I do not see any evidence of active infection locally or systemically at this time which  is great news. No fevers, chills, nausea, vomiting, or diarrhea. 04-10-2022 upon evaluation today patient appears to be doing well currently in regard to his foot ulcer I think were very close to complete resolution which is great news. Fortunately I do not see any signs of active infection locally or systemically at this time. No fevers, chills, nausea, vomiting, or diarrhea. Electronic Signature(s) Signed: 04/10/2022 10:53:21 AM By: Worthy Keeler PA-C Entered By: Worthy Keeler on 04/10/2022 10:53:20 -------------------------------------------------------------------------------- Physical Exam Details Patient Name: Date of Service: [name] [facility] [name]. 04/10/2022 10:30 A M Medical Record Number: [id number] Patient Account Number: [id number] Date of Birth/Sex: Treating RN: February 26, 1941 (81 y.o. [name]) Carlene Coria Primary Care Provider: [name] Other Clinician: Massie Kluver Referring Provider: Treating Provider/Extender: [name] in Treatment: 9 Constitutional Well-nourished and well-hydrated in no acute distress. Respiratory normal breathing without difficulty. Psychiatric this patient is able to make decisions and demonstrates good insight into disease process. Alert and Oriented x 3. pleasant and cooperative. Notes Upon inspection patient's wound bed showed evidence of good granulation and epithelization currently and I do feel like that were headed in the right direction here. I do not see any signs of infection which is excellent. He does have an area opening on the lateral aspect of his right leg but this is more of just a lymphedema type opening to be honest I think is just due to swelling and he does have an appointment with vascular coming up beginning of November. Electronic Signature(s) Signed: 04/10/2022 10:53:49 AM By: Worthy Keeler PA-C Entered By: Worthy Keeler on 04/10/2022  10:53:49 -------------------------------------------------------------------------------- Physician Orders Details Patient Name: Date of Service: [name] [facility] [name]. 04/10/2022 10:30 A M Medical Record Number: [id number] Patient Account Number: [id number] Date of Birth/Sex: Treating RN: January 18, 1941 (81 y.o. Oval Linsey Primary Care Provider: [name] Other Clinician: Tivon, [name] ([id number]) [phone].pdf Page 4 of 9 Referring Provider: Treating Provider/Extender: [name] in Treatment: 12 Verbal / Phone Orders: No Diagnosis Coding ICD-10 Coding Code Description I87.333 Chronic venous hypertension (idiopathic) with ulcer and inflammation of bilateral lower extremity L97.812 Non-pressure chronic ulcer of other part of right lower leg with fat layer exposed L97.822 Non-pressure chronic ulcer of other part of left lower leg with fat layer exposed G90.09 Other idiopathic peripheral autonomic neuropathy L97.522 Non-pressure chronic ulcer of other part of left foot with fat layer exposed L97.512 Non-pressure chronic ulcer of other part of right foot with fat layer exposed I10 Essential (primary) hypertension I50.42 Chronic combined systolic (congestive) and diastolic (congestive) heart failure I48.0 Paroxysmal atrial fibrillation I42.0 Dilated cardiomyopathy Z79.01 Long term (current) use of anticoagulants I25.10 Atherosclerotic heart disease of native coronary artery without angina pectoris N18.32 Chronic kidney disease, stage 3b Follow-up Appointments Return Appointment in 1 week. Nurse Visit as needed Bathing/ Shower/ Hygiene May shower with wound dressing protected with water repellent cover or cast protector. Anesthetic (Use 'Patient Medications' Section for Anesthetic Order Entry) Lidocaine applied to wound bed Edema Control - Lymphedema / Segmental Compressive Device / Other Tubigrip double layer  applied - tubi [name] double layer Elevate, Exercise Daily and A void Standing for Long Periods of Time. Elevate legs to the level of the heart and pump ankles as often as possible Elevate leg(s) parallel to the floor when sitting. Off-Loading Right Lower Extremity Open toe surgical  shoe with peg assist. Wound Treatment Wound #2 - Foot Wound Laterality: Plantar, Right Cleanser: Wound Cleanser 3 x Per Week/30 Days Discharge Instructions: Wash your hands with soap and water. Remove old dressing, discard into plastic bag and place into trash. Cleanse the wound with Wound Cleanser prior to applying a clean dressing using gauze sponges, not tissues or cotton balls. Do not scrub or use excessive force. Pat dry using gauze sponges, not tissue or cotton balls. Prim Dressing: Prisma 4.34 (in) 3 x Per Week/30 Days ary Discharge Instructions: Moisten w/normal saline or sterile water; Cover wound as directed. Do not remove from wound bed. Secondary Dressing: Zetuvit Plus 4x8 (in/in) 3 x Per Week/30 Days Secured With: Tubigrip Size E, 3.5x10 (in/yds) 3 x Per Week/30 Days Discharge Instructions: Apply 3 Tubigrip E 3-finger-widths below knee to base of toes to secure dressing and/or for swelling. Wound #9 - Lower Leg Wound Laterality: Right, Lateral Cleanser: Wound Cleanser 3 x Per Week/30 Days Discharge Instructions: Wash your hands with soap and water. Remove old dressing, discard into plastic bag and place into trash. Cleanse the wound with Wound Cleanser prior to applying a clean dressing using gauze sponges, not tissues or cotton balls. Do not scrub or use excessive force. Pat dry using gauze sponges, not tissue or cotton balls. Prim Dressing: Xeroform-HBD 2x2 (in/in) 3 x Per Week/30 Days ary Discharge Instructions: Apply Xeroform-HBD 2x2 (in/in) as directed Secondary Dressing: ABD Pad 5x9 (in/in) 3 x Per Week/30 Days Discharge Instructions: Cover with ABD pad Secured With: Conform 4'' - Conforming  Stretch Gauze Bandage 4x75 (in/in) 3 x Per Week/30 Days Discharge Instructions: Apply as directed Secured With: Tubigrip Size E, 3.5x10 (in/yds) 3 x Per Week/30 Days Discharge Instructions: Apply 3 Tubigrip E 3-finger-widths below knee to base of toes to secure dressing and/or for swelling. [name], [name] ([id number]) [id number] Page 5 of 9 Electronic Signature(s) Signed: 04/10/2022 5:03:23 PM By: Worthy Keeler PA-C Signed: 04/17/2022 11:24:03 AM By: Massie Kluver Entered By: Massie Kluver on 04/10/2022 11:04:48 -------------------------------------------------------------------------------- Problem List Details Patient Name: Date of Service: [name], [name] LPH [name]. 04/10/2022 10:30 A M Medical Record Number: [id number] Patient Account Number: [id number] Date of Birth/Sex: Treating RN: Dec 13, 1940 (81 y.o. [name]) Carlene Coria Primary Care Provider: [name] Other Clinician: Massie Kluver Referring Provider: Treating Provider/Extender: [name] in Treatment: 12 Active Problems ICD-10 Encounter Code Description Active Date MDM Diagnosis I87.333 Chronic venous hypertension (idiopathic) with ulcer and inflammation of 01/16/2022 No Yes bilateral lower extremity L97.812 Non-pressure chronic ulcer of other part of right lower leg with fat layer 01/16/2022 No Yes exposed L97.822 Non-pressure chronic ulcer of other part of left lower leg with fat layer exposed 01/16/2022 No Yes G90.09 Other idiopathic peripheral autonomic neuropathy 01/16/2022 No Yes L97.522 Non-pressure chronic ulcer of other part of left foot with fat layer exposed 01/16/2022 No Yes L97.512 Non-pressure chronic ulcer of other part of right foot with fat layer exposed 01/16/2022 No Yes I10 Essential (primary) hypertension 01/16/2022 No Yes I50.42 Chronic combined systolic (congestive) and diastolic (congestive) heart failure 01/16/2022 No Yes I48.0 Paroxysmal atrial  fibrillation 01/16/2022 No Yes I42.0 Dilated cardiomyopathy 01/16/2022 No Yes Bancroft, Braeson [name] ([id number]) [id number] Page 6 of 9 Z79.01 Long term (current) use of anticoagulants 01/16/2022 No Yes I25.10 Atherosclerotic heart disease of native coronary artery without angina pectoris 01/16/2022 No Yes N18.32 Chronic kidney disease, stage 3b 01/16/2022 No Yes Inactive Problems Resolved Problems Electronic Signature(s) Signed: 04/10/2022 10:35:55 AM By: Worthy Keeler  PA-C Entered By: Worthy Keeler on 04/10/2022 10:35:54 -------------------------------------------------------------------------------- Progress Note Details Patient Name: Date of Service: [name] [facility] [name]. 04/10/2022 10:30 A M Medical Record Number: [id number] Patient Account Number: [id number] Date of Birth/Sex: Treating RN: [birth year] (81 y.o. [name]) Carlene Coria Primary Care Provider: [name] Other Clinician: Massie Kluver Referring Provider: Treating Provider/Extender: [name] in Treatment: 12 Subjective Chief Complaint Information obtained from Patient Multiple LE Ulcers History of Present Illness (HPI) 01-16-2022 upon evaluation today patient presents for initial inspection here in the clinic concerning issues that he has been having with wounds over the bilateral lower extremities and bilateral feet. Fortunately there does not appear to be any signs of significant infection at this point which is great news. Unfortunately his ABIs were registering at 0.49 on the left and 0.57 on the right here in the clinic on the screening today. With that being said I do think that it may possibly be true that we need to get him to formal arterial studies in order to see how things really are showing up. And the patient's not in disagreement with this for that reason we are going to make that referral as well. With that being said he does have some significant past medical  history items of note detailed below. Patient does have chronic kidney disease stage IIIb, coronary artery disease, long-term use of anticoagulant therapy, dilated cardiomyopathy. Proximal atrial fibrillation, he is on Xarelto long-term. He also has congestive heart failure, hypertension, peripheral neuropathy not related to diabetes as he is only prediabetic with a hemoglobin A1c of 5.8, and chronic venous hypertension. He has recently seen Dr. [name] show his cardiologist who did discontinue the hydrochlorothiazide and the Lasix and felt that the patient was retaining more fluid than he should be. For that reason he was started on furosemide 20 mg twice daily. 7/27; patient with bilateral lower extremity wounds. These are largely venous although he had very poor ABIs on screening test here. He has an appointment apparently tomorrow at vein and vascular for arterial studies. He was not too keen on the latter although I think I emphasized the need to do this both in terms of dealing with the current wounds and being prepared for further issues down the road. We have been using silver alginate on the wounds and Tubigrip's edema control is better per our intake nurse 02-06-2022 upon evaluation today patient appears to be doing well with regard to his wound. Fortunately there is no signs of infection he is actually healing for the most part on the bilateral feet. His legs are also doing much better though he has 1 new area that popped up on the right leg. Fortunately there is no evidence of active infection of note his ABIs and TBI's were poor on the vascular screening on the 31st with the left being worse than the right. For that reason I am going to recommend he still continue to follow-up with vascular to discuss angioplasty/stenting. 8-124-23 upon evaluation today patient appears to be doing well currently in regard to his wound on the leg although he has several other blisters that are showing up at  this point. I do believe that we need to try to do something to get his swelling under control this could be of utmost importance as far as getting CEYLON, ARENSON ([id number]) [id number] Page 7 of 9 this healed is concerned. 03-06-2022 upon evaluation today patient appears to be doing well currently in regard to  his wounds. In fact the legs are drying up and seem to be healing quite nicely the lateral wounds are still weeping a little bit about the medial side actually appears to be pretty much dry and closed. The plantar aspect of his foot still is draining a bit but again it seems to be doing much better which is great news.[name] 03-13-2022 upon evaluation patient appears to be almost completely healed. His leg is doing excellent and his foot is just about closed. With that being said he did see vascular and they actually discussed the possibility of doing an arteriogram/angiogram in order to see if anything needed to be the balloons were stented open. With that being said he decided not to do that since things are healing so well right now and he is going to consider that for future they plan to see him back in November. 03-21-2022 upon evaluation today patient appears to be doing a little worse in regard to his wound on the foot. The wound keeps seeming to pull together unfortunately which is causing a trapped some fluid is just not healing quite as well as would like to see. With that being said I am going to try to remove a bit more of the overgrowth callus tissue today and hopefully that can help get this to seal up much more effectively and quickly. Fortunately I see no signs of infection of the foot although the leg on the right does seem to show some signs of erythema today I do think we need to address that currently. This looks to be cellulitis. 03-31-2022 upon evaluation today patient appears to be doing better in regard to the cellulitis I am pleased in that regard  he is still taking the antibiotics. Fortunately I do not see any evidence of active infection locally or systemically at this time which is great news. No fevers, chills, nausea, vomiting, or diarrhea. 04-10-2022 upon evaluation today patient appears to be doing well currently in regard to his foot ulcer I think were very close to complete resolution which is great news. Fortunately I do not see any signs of active infection locally or systemically at this time. No fevers, chills, nausea, vomiting, or diarrhea. Objective Constitutional Well-nourished and well-hydrated in no acute distress. Vitals Time Taken: 10:21 AM, Height: 74 in, Weight: 244 lbs, BMI: 31.3, Temperature: 97.8 F, Pulse: 60 bpm, Respiratory Rate: 18 breaths/min, Blood Pressure: 121/68 mmHg. Respiratory normal breathing without difficulty. Psychiatric this patient is able to make decisions and demonstrates good insight into disease process. Alert and Oriented x 3. pleasant and cooperative. General Notes: Upon inspection patient's wound bed showed evidence of good granulation and epithelization currently and I do feel like that were headed in the right direction here. I do not see any signs of infection which is excellent. He does have an area opening on the lateral aspect of his right leg but this is more of just a lymphedema type opening to be honest I think is just due to swelling and he does have an appointment with vascular coming up beginning of November. Integumentary (Hair, Skin) Wound #2 status is Open. Original cause of wound was Gradually Appeared. The date acquired was: 12/28/2020. The wound has been in treatment 12 weeks. The wound is located on the [place]. The wound measures 0.3cm length x 0.2cm width x 0.1cm depth; 0.047cm^2 area and 0.005cm^3 volume. There is no tunneling or undermining noted. There is a medium amount of serosanguineous drainage noted. There is no  granulation within the wound bed. There  is no necrotic tissue within the wound bed. Wound #9 status is Open. Original cause of wound was Skin Tear/Laceration. The date acquired was: 04/09/2022. The wound is located on the Right,Lateral Lower Leg. The wound measures 1.3cm length x 0.5cm width x 0.1cm depth; 0.511cm^2 area and 0.051cm^3 volume. There is Fat Layer (Subcutaneous Tissue) exposed. There is a medium amount of serosanguineous drainage noted. The wound margin is flat and intact. There is no granulation within the wound bed. There is no necrotic tissue within the wound bed. Assessment Active Problems ICD-10 Chronic venous hypertension (idiopathic) with ulcer and inflammation of bilateral lower extremity Non-pressure chronic ulcer of other part of right lower leg with fat layer exposed Non-pressure chronic ulcer of other part of left lower leg with fat layer exposed Other idiopathic peripheral autonomic neuropathy Non-pressure chronic ulcer of other part of left foot with fat layer exposed Non-pressure chronic ulcer of other part of right foot with fat layer exposed Essential (primary) hypertension Chronic combined systolic (congestive) and diastolic (congestive) heart failure Paroxysmal atrial fibrillation Dilated cardiomyopathy Long term (current) use of anticoagulants Atherosclerotic heart disease of native coronary artery without angina pectoris Chronic kidney disease, stage 3b [name], [name] ([id number]) [id number] Page 8 of 9 Procedures Wound #2 Pre-procedure diagnosis of Wound #2 is a Neuropathic Ulcer-Non Diabetic located on the Right,Plantar Foot . There was a Selective/Open Wound Non-Viable Tissue Debridement with a total area of 0.25 sq cm performed by Tommie Sams., PA-C. With the following instrument(s): Curette to remove Viable and Non- Viable tissue/material. Material removed includes Callus. A time out was conducted at 10:47, prior to the start of the procedure. A Minimum  amount of bleeding was controlled with Pressure. The procedure was tolerated well. Post Debridement Measurements: 0.3cm length x 0.2cm width x 0.1cm depth; 0.005cm^3 volume. Character of Wound/Ulcer Post Debridement is stable. Post procedure Diagnosis Wound #2: Same as Pre-Procedure Plan Follow-up Appointments: Return Appointment in 1 week. Nurse Visit as needed Bathing/ Shower/ Hygiene: May shower with wound dressing protected with water repellent cover or cast protector. Anesthetic (Use 'Patient Medications' Section for Anesthetic Order Entry): Lidocaine applied to wound bed Edema Control - Lymphedema / Segmental Compressive Device / Other: Tubigrip double layer applied - tubi [name] double layer Elevate, Exercise Daily and Avoid Standing for Long Periods of Time. Elevate legs to the level of the heart and pump ankles as often as possible Elevate leg(s) parallel to the floor when sitting. Off-Loading: Open toe surgical shoe with peg assist. WOUND #2: - Foot Wound Laterality: Plantar, Right Cleanser: Wound Cleanser 3 x Per Week/30 Days Discharge Instructions: Wash your hands with soap and water. Remove old dressing, discard into plastic bag and place into trash. Cleanse the wound with Wound Cleanser prior to applying a clean dressing using gauze sponges, not tissues or cotton balls. Do not scrub or use excessive force. Pat dry using gauze sponges, not tissue or cotton balls. Prim Dressing: Prisma 4.34 (in) 3 x Per Week/30 Days ary Discharge Instructions: Moisten w/normal saline or sterile water; Cover wound as directed. Do not remove from wound bed. Secondary Dressing: Zetuvit Plus 4x8 (in/in) 3 x Per Week/30 Days Secured With: Tubigrip Size E, 3.5x10 (in/yds) 3 x Per Week/30 Days Discharge Instructions: Apply 3 Tubigrip E 3-finger-widths below knee to base of toes to secure dressing and/or for swelling. WOUND #9: - Lower Leg Wound Laterality: Right, Lateral Cleanser: Wound Cleanser 3 x  Per Week/30 Days Discharge Instructions:  Wash your hands with soap and water. Remove old dressing, discard into plastic bag and place into trash. Cleanse the wound with Wound Cleanser prior to applying a clean dressing using gauze sponges, not tissues or cotton balls. Do not scrub or use excessive force. Pat dry using gauze sponges, not tissue or cotton balls. Prim Dressing: Xeroform-HBD 2x2 (in/in) 3 x Per Week/30 Days ary Discharge Instructions: Apply Xeroform-HBD 2x2 (in/in) as directed Secondary Dressing: ABD Pad 5x9 (in/in) 3 x Per Week/30 Days Discharge Instructions: Cover with ABD pad Secured With: Conform 4'' - Conforming Stretch Gauze Bandage 4x75 (in/in) 3 x Per Week/30 Days Discharge Instructions: Apply as directed Secured With: Tubigrip Size E, 3.5x10 (in/yds) 3 x Per Week/30 Days Discharge Instructions: Apply 3 Tubigrip E 3-finger-widths below knee to base of toes to secure dressing and/or for swelling. 1. I am going to suggest that we have the patient go ahead and continue to monitor for any signs of worsening or infection. Obviously I do believe that he is really doing quite well and I do not see any signs of infection right now which is great news. We will continue with the collagen for the foot. 2. I am also can recommend that we have the patient continue with the Xeroform gauze to the leg which I feel like is really doing quite well. I do think that the Tubigrip is the best we can do from a compression standpoint as well. We will see patient back for reevaluation in 1 week here in the clinic. If anything worsens or changes patient will contact our office for additional recommendations. Electronic Signature(s) Signed: 04/10/2022 11:01:41 AM By: Worthy Keeler PA-C Entered By: Worthy Keeler on 04/10/2022 11:01:41 [name], [name] ([id number]) [id number] Page 9 of  9 -------------------------------------------------------------------------------- SuperBill Details Patient Name: Date of Service: [name] [facility] [name]. 04/10/2022 Medical Record Number: [id number] Patient Account Number: [id number] Date of Birth/Sex: Treating RN: [birth year] (81 y.o. [name]) Carlene Coria Primary Care Provider: [name] Other Clinician: Massie Kluver Referring Provider: Treating Provider/Extender: [name] in Treatment: 12 Diagnosis Coding ICD-10 Codes Code Description [phone] Chronic venous hypertension (idiopathic) with ulcer and inflammation of bilateral lower extremity L97.812 Non-pressure chronic ulcer of other part of right lower leg with fat layer exposed L97.822 Non-pressure chronic ulcer of other part of left lower leg with fat layer exposed G90.09 Other idiopathic peripheral autonomic neuropathy L97.522 Non-pressure chronic ulcer of other part of left foot with fat layer exposed L97.512 Non-pressure chronic ulcer of other part of right foot with fat layer exposed I10 Essential (primary) hypertension I50.42 Chronic combined systolic (congestive) and diastolic (congestive) heart failure I48.0 Paroxysmal atrial fibrillation I42.0 Dilated cardiomyopathy Z79.01 Long term (current) use of anticoagulants I25.10 Atherosclerotic heart disease of native coronary artery without angina pectoris N18.32 Chronic kidney disease, stage 3b Facility Procedures : CPT4 Code: [id number] Description: 91505 - DEBRIDE WOUND 1ST 20 SQ CM OR < ICD-10 Diagnosis Description L97.512 Non-pressure chronic ulcer of other part of right foot with fat layer exposed Modifier: Quantity: 1 Physician Procedures : CPT4 Code Description Modifier [id number] - WC PHYS DEBR WO ANESTH 20 SQ CM ICD-10 Diagnosis Description L97.512 Non-pressure chronic ulcer of other part of right foot with fat layer exposed Quantity: 1 Electronic Signature(s) Signed: 04/10/2022 11:02:11 AM By:  Worthy Keeler PA-C Entered By: Worthy Keeler on 04/10/2022 11:02:10

## 2022-04-14 DIAGNOSIS — M5416 Radiculopathy, lumbar region: Secondary | ICD-10-CM | POA: Diagnosis not present

## 2022-04-14 DIAGNOSIS — Z79899 Other long term (current) drug therapy: Secondary | ICD-10-CM | POA: Diagnosis not present

## 2022-04-16 ENCOUNTER — Ambulatory Visit (INDEPENDENT_AMBULATORY_CARE_PROVIDER_SITE_OTHER): Payer: PPO

## 2022-04-16 DIAGNOSIS — Z23 Encounter for immunization: Secondary | ICD-10-CM

## 2022-04-17 NOTE — Progress Notes (Signed)
CASIMER, RUSSETT ([id number]) [id number] Page 1 of 10 Visit Report for 04/10/2022 Arrival Information Details Patient Name: Date of Service: [name] [name]. 04/10/2022 10:30 A M Medical Record Number: [id number] Patient Account Number: [id number] Date of Birth/Sex: Treating RN: 1940/09/10 (81 y.o. [name]) [name] Primary Care [name]: [name] Other Clinician: Massie [name] Referring Kasee Hantz: Treating [name]/Extender: [name] in Treatment: 12 Visit Information History Since Last Visit All ordered tests and consults were completed: No Patient Arrived: [name] Added or deleted any medications: No Arrival Time: 10:21 Any new allergies or adverse reactions: No Transfer Assistance: None Had a fall or experienced change in No Patient Requires Transmission-Based Precautions: No activities of daily living that may affect Patient Has Alerts: No risk of falls: Signs or symptoms of abuse/neglect since last visito No Hospitalized since last visit: No Pain Present Now: No Electronic Signature(s) Signed: 04/17/2022 11:24:03 AM By: [name] Entered By: [name] on 04/10/2022 10:21:29 -------------------------------------------------------------------------------- Clinic Level of Care Assessment Details Patient Name: Date of Service: [name] [name]. 04/10/2022 10:30 A M Medical Record Number: [id number] Patient Account Number: [id number] Date of Birth/Sex: Treating RN: December 22, 1940 (81 y.o. [name]) [name] Primary Care Jennife [name]: [name] Other Clinician: Massie [name] Referring Nevah Dalal: Treating [name]/Extender: [name] in Treatment: 12 Clinic Level of Care Assessment Items TOOL 1 Quantity Score []  - 0 Use when EandM and Procedure is performed on INITIAL visit ASSESSMENTS - Nursing Assessment / Reassessment []  - 0 General Physical Exam (combine w/ comprehensive assessment  (listed just below) when performed on new pt. evals) []  - 0 Comprehensive Assessment (HX, ROS, Risk Assessments, Wounds Hx, etc.) ASSESSMENTS - Wound and Skin Assessment / Reassessment []  - 0 Dermatologic / Skin Assessment (not related to wound area) [name], [name] ([id number]) [id number] Page 2 of 10 ASSESSMENTS - Ostomy and/or Continence Assessment and Care []  - 0 Incontinence Assessment and Management []  - 0 Ostomy Care Assessment and Management (repouching, etc.) PROCESS - Coordination of Care []  - 0 Simple Patient / Family Education for ongoing care []  - 0 Complex (extensive) Patient / Family Education for ongoing care []  - 0 Staff obtains Programmer, systems, Records, T Results / Process Orders est []  - 0 Staff telephones HHA, Nursing Homes / Clarify orders / etc []  - 0 Routine Transfer to another Facility (non-emergent condition) []  - 0 Routine Hospital Admission (non-emergent condition) []  - 0 New Admissions / Biomedical engineer / Ordering NPWT Apligraf, etc. , []  - 0 Emergency Hospital Admission (emergent condition) PROCESS - Special Needs []  - 0 Pediatric / Minor Patient Management []  - 0 Isolation Patient Management []  - 0 Hearing / Language / Visual special needs []  - 0 Assessment of Community assistance (transportation, [name]/C planning, etc.) []  - 0 Additional assistance / Altered mentation []  - 0 Support Surface(s) Assessment (bed, cushion, seat, etc.) INTERVENTIONS - Miscellaneous []  - 0 External ear exam []  - 0 Patient Transfer (multiple staff / Civil Service fast streamer / Similar devices) []  - 0 Simple Staple / Suture removal (25 or less) []  - 0 Complex Staple / Suture removal (26 or more) []  - 0 Hypo/Hyperglycemic Management (do not check if billed separately) []  - 0 Ankle / Brachial Index (ABI) - do not check if billed separately Has the patient been seen at the hospital within the last three years: Yes Total Score: 0 Level Of Care:  ____ Electronic Signature(s) Signed: 04/17/2022 11:24:03 AM By: [name] Entered By: [name] on  04/10/2022 11:04:54 -------------------------------------------------------------------------------- Encounter Discharge Information Details Patient Name: Date of Service: [name] [name]. 04/10/2022 10:30 A M Medical Record Number: [id number] Patient Account Number: [id number] Date of Birth/Sex: Treating RN: 1940-11-03 (81 y.o. [name]) [name] Primary Care [name]: [name] Other Clinician: Massie [name] Referring [name]: Treating [name]/Extender: [name] in Treatment: 12 Encounter Discharge Information Items Post Procedure Vitals [name], [name] ([id number]) [id number] Page 3 of 10 Discharge Condition: Stable Temperature (F): 97.8 Ambulatory Status: Walker Pulse (bpm): 60 Discharge Destination: Home Respiratory Rate (breaths/min): 18 Transportation: Private Auto Blood Pressure (mmHg): 121/68 Accompanied By: self Schedule Follow-up Appointment: Yes Clinical Summary of Care: Electronic Signature(s) Signed: 04/17/2022 11:24:03 AM By: [name] Entered By: [name] on 04/10/2022 11:05:44 -------------------------------------------------------------------------------- Lower Extremity Assessment Details Patient Name: Date of Service: [name] [facility] [name]. 04/10/2022 10:30 A M Medical Record Number: [id number] Patient Account Number: [id number] Date of Birth/Sex: Treating RN: 1941/01/27 (81 y.o. [name]) [name] Primary Care [name]: [name] Other Clinician: Massie [name] Referring [name]: Treating [name]/Extender: [name] in Treatment: 12 Edema Assessment Assessed: [name]: No] [Right: Yes] Edema: [Left: Ye] [Right: s] Calf Left: Right: Point of Measurement: 38 cm From Medial Instep 40.4 cm Ankle Left: Right: Point of Measurement: 15 cm From Medial Instep 28.6  cm Vascular Assessment Pulses: Dorsalis Pedis Palpable: [Right:Yes] Posterior Tibial Palpable: [Right:Yes] Electronic Signature(s) Signed: 04/11/2022 12:25:01 PM By: [name] Coria RN Signed: 04/17/2022 11:24:03 AM By: [name] Entered By: [name] on 04/10/2022 10:35:38 [name], [name] ([id number]) [id number] Page 4 of 10 -------------------------------------------------------------------------------- Multi Wound Chart Details Patient Name: Date of Service: [name] [facility] [name]. 04/10/2022 10:30 A M Medical Record Number: [id number] Patient Account Number: [id number] Date of Birth/Sex: Treating RN: 1940/07/14 (81 y.o. [name]) [name] Primary Care Jatavius Ellenwood: [name] Other Clinician: Massie [name] Referring Anabelle Bungert: Treating Stephaney Steven/Extender: [name] in Treatment: 12 Vital Signs Height(in): 74 Pulse(bpm): 60 Weight(lbs): 244 Blood Pressure(mmHg): 121/68 Body Mass Index(BMI): 31.3 Temperature(F): 97.8 Respiratory Rate(breaths/min): 18 [2:Photos:] [N/A:N/A] Right, Plantar Foot Right, Lateral Lower Leg N/A Wound Location: Gradually Appeared Skin Tear/Laceration N/A Wounding Event: Neuropathic Ulcer-Non Diabetic Skin Tear N/A Primary Etiology: Congestive Heart Failure, Congestive Heart Failure, N/A Comorbid History: Hypertension Hypertension 12/28/2020 04/09/2022 N/A Date Acquired: 12 0 N/A Weeks of Treatment: Open Open N/A Wound Status: No No N/A Wound Recurrence: 0.3x0.2x0.1 1.3x0.5x0.1 N/A Measurements L x W x [name] (cm) 0.047 0.511 N/A A (cm) : rea 0.005 0.051 N/A Volume (cm) : 40.50% N/A N/A % Reduction in Area: 79.20% N/A N/A % Reduction in Volume: Full Thickness Without Exposed Partial Thickness N/A Classification: Support Structures Medium Medium N/A Exudate A mount: Serosanguineous Serosanguineous N/A Exudate Type: red, brown red, brown N/A Exudate Color: N/A Flat and Intact  N/A Wound Margin: None Present (0%) None Present (0%) N/A Granulation A mount: None Present (0%) None Present (0%) N/A Necrotic A mount: Medium (34-66%) None N/A Epithelialization: Treatment Notes Electronic Signature(s) Signed: 04/17/2022 11:24:03 AM By: [name] Entered By: [name] on 04/10/2022 10:35:52 Peabody, Manoah [name] ([id number]) [id number] Page 5 of 10 -------------------------------------------------------------------------------- Multi-Disciplinary Care Plan Details Patient Name: Date of Service: [name] [facility] [name]. 04/10/2022 10:30 A M Medical Record Number: [id number] Patient Account Number: [id number] Date of Birth/Sex: Treating RN: 01/15/1941 (81 y.o. [name]) [name] Primary Care Elwanda Moger: [name] Other Clinician: Massie [name] Referring Jalei Shibley: Treating Emily Massar/Extender: [name] in Treatment: 12 Active Inactive Necrotic Tissue Nursing Diagnoses:  Impaired tissue integrity related to necrotic/devitalized tissue Knowledge deficit related to management of necrotic/devitalized tissue Goals: Necrotic/devitalized tissue will be minimized in the wound bed Date Initiated: 03/31/2022 Target Resolution Date: 03/31/2022 Goal Status: Active Patient/caregiver will verbalize understanding of reason and process for debridement of necrotic tissue Date Initiated: 03/31/2022 Target Resolution Date: 03/31/2022 Goal Status: Active Interventions: Assess patient pain level pre-, during and post procedure and prior to discharge Provide education on necrotic tissue and debridement process Treatment Activities: Excisional debridement : 03/31/2022 Notes: Wound/Skin Impairment Nursing Diagnoses: Knowledge deficit related to ulceration/compromised skin integrity Goals: Patient/caregiver will verbalize understanding of skin care regimen Date Initiated: 01/16/2022 Target Resolution Date: 02/16/2022 Goal Status:  Active Ulcer/skin breakdown will have a volume reduction of 30% by week 4 Date Initiated: 01/16/2022 Target Resolution Date: 02/16/2022 Goal Status: Active Ulcer/skin breakdown will have a volume reduction of 50% by week 8 Date Initiated: 01/16/2022 Target Resolution Date: 03/19/2022 Goal Status: Active Ulcer/skin breakdown will have a volume reduction of 80% by week 12 Date Initiated: 01/16/2022 Target Resolution Date: 04/18/2022 Goal Status: Active Ulcer/skin breakdown will heal within 14 weeks Date Initiated: 01/16/2022 Target Resolution Date: 05/19/2022 Goal Status: Active Interventions: Assess patient/caregiver ability to obtain necessary supplies Assess patient/caregiver ability to perform ulcer/skin care regimen upon admission and as needed Assess ulceration(s) every visit Notes: [name], [name] ([id number]) [id number] Page 6 of 10 Electronic Signature(s) Signed: 04/11/2022 12:25:01 PM By: [name] Coria RN Signed: 04/17/2022 11:24:03 AM By: [name] Entered By: [name] on 04/10/2022 10:35:46 -------------------------------------------------------------------------------- Pain Assessment Details Patient Name: Date of Service: [name] [facility] [name]. 04/10/2022 10:30 A M Medical Record Number: [id number] Patient Account Number: [id number] Date of Birth/Sex: Treating RN: Dec 19, 1940 (81 y.o. Oval Linsey Primary Care Leviticus Harton: [name] Other Clinician: Massie [name] Referring Cammie Faulstich: Treating Jsaon Yoo/Extender: [name] in Treatment: 12 Active Problems Location of Pain Severity and Description of Pain Patient Has Paino No Site Locations Pain Management and Medication Current Pain Management: Electronic Signature(s) Signed: 04/11/2022 12:25:01 PM By: [name] Coria RN Signed: 04/17/2022 11:24:03 AM By: [name] Entered By: [name] on 04/10/2022 10:24:21 [name], [name] ([id number])  [id number] Page 7 of 10 -------------------------------------------------------------------------------- Patient/Caregiver Education Details Patient Name: Date of Service: [name] [facility] [name]. 10/12/2023andnbsp10:30 [place] Record Number: [id number] Patient Account Number: [id number] Date of Birth/Gender: Treating RN: [birth year] (81 y.o. [name]) [name] Primary Care Physician: [name] Other Clinician: Massie [name] Referring Physician: Treating Physician/Extender: [name] in Treatment: 12 Education Assessment Education Provided To: Patient Education Topics Provided Wound/Skin Impairment: Handouts: Other: continue wound care as directed Methods: Explain/Verbal Responses: State content correctly Electronic Signature(s) Signed: 04/17/2022 11:24:03 AM By: [name] Entered By: [name] on 04/10/2022 10:52:55 -------------------------------------------------------------------------------- Wound Assessment Details Patient Name: Date of Service: [name] [facility] [name]. 04/10/2022 10:30 A M Medical Record Number: [id number] Patient Account Number: [id number] Date of Birth/Sex: Treating RN: 1940/11/24 (81 y.o. [name]) [name] Primary Care Cane Dubray: [name] Other Clinician: Massie [name] Referring Emarie Paul: Treating Shaakira Borrero/Extender: [name] in Treatment: 12 Wound Status Wound Number: 2 Primary Etiology: Neuropathic Ulcer-Non Diabetic Wound Location: Right, Plantar Foot Wound Status: Open Wounding Event: Gradually Appeared Comorbid History: Congestive Heart Failure, Hypertension Date Acquired: 12/28/2020 Weeks Of Treatment: 12 Clustered Wound: No Photos Wound Measurements Length: (cm) 0.3 [name], [name] ([id number]) Width: (cm) 0 Depth: (cm) 0 Area: (cm) Volume: (cm) % Reduction in Area: 40.5% [id number] Page 8 of 10 .2 %  Reduction in Volume:  79.2% .1 Epithelialization: Medium (34-66%) 0.047 Tunneling: No 0.005 Undermining: No Wound Description Classification: Full Thickness Without Exposed Support Structures Exudate Amount: Medium Exudate Type: Serosanguineous Exudate Color: red, brown Foul Odor After Cleansing: No Slough/Fibrino No Wound Bed Granulation Amount: None Present (0%) Necrotic Amount: None Present (0%) Treatment Notes Wound #2 (Foot) Wound Laterality: Plantar, Right Cleanser Wound Cleanser Discharge Instruction: Wash your hands with soap and water. Remove old dressing, discard into plastic bag and place into trash. Cleanse the wound with Wound Cleanser prior to applying a clean dressing using gauze sponges, not tissues or cotton balls. Do not scrub or use excessive force. Pat dry using gauze sponges, not tissue or cotton balls. Peri-Wound Care Topical Primary Dressing Prisma 4.34 (in) Discharge Instruction: Moisten w/normal saline or sterile water; Cover wound as directed. Do not remove from wound bed. Secondary Dressing Zetuvit Plus 4x8 (in/in) Secured With Tubigrip Size E, 3.5x10 (in/yds) Discharge Instruction: Apply 3 Tubigrip E 3-finger-widths below knee to base of toes to secure dressing and/or for swelling. Compression Wrap Compression Stockings Add-Ons Electronic Signature(s) Signed: 04/11/2022 12:25:01 PM By: [name] Coria RN Signed: 04/17/2022 11:24:03 AM By: [name] Entered By: [name] on 04/10/2022 10:32:35 -------------------------------------------------------------------------------- Wound Assessment Details Patient Name: Date of Service: [name] [facility] [name]. 04/10/2022 10:30 A M Medical Record Number: [id number] Patient Account Number: [id number] Date of Birth/Sex: Treating RN: Dec 29, 1940 (81 y.o. [name]) [name] Primary Care Samarth Ogle: [name] Other Clinician: Massie [name] Referring Amenah Tucci: Treating Christipher Rieger/Extender: [name] in  Treatment: 12 Wound Status Wound Number: 9 Primary Etiology: Skin Tear [name], [name] ([id number]) [id number] Page 9 of 10 Wound Location: Right, Lateral Lower Leg Wound Status: Open Wounding Event: Skin Tear/Laceration Comorbid History: Congestive Heart Failure, Hypertension Date Acquired: 04/09/2022 Weeks Of Treatment: 0 Clustered Wound: No Photos Wound Measurements Length: (cm) 1.3 Width: (cm) 0.5 Depth: (cm) 0.1 Area: (cm) 0.511 Volume: (cm) 0.051 % Reduction in Area: % Reduction in Volume: Epithelialization: None Wound Description Classification: Partial Thickness Wound Margin: Flat and Intact Exudate Amount: Medium Exudate Type: Serosanguineous Exudate Color: red, brown Foul Odor After Cleansing: No Slough/Fibrino No Wound Bed Granulation Amount: None Present (0%) Exposed Structure Necrotic Amount: None Present (0%) Fascia Exposed: No Fat Layer (Subcutaneous Tissue) Exposed: Yes Tendon Exposed: No Muscle Exposed: No Joint Exposed: No Bone Exposed: No Treatment Notes Wound #9 (Lower Leg) Wound Laterality: Right, Lateral Cleanser Wound Cleanser Discharge Instruction: Wash your hands with soap and water. Remove old dressing, discard into plastic bag and place into trash. Cleanse the wound with Wound Cleanser prior to applying a clean dressing using gauze sponges, not tissues or cotton balls. Do not scrub or use excessive force. Pat dry using gauze sponges, not tissue or cotton balls. Peri-Wound Care Topical Primary Dressing Xeroform-HBD 2x2 (in/in) Discharge Instruction: Apply Xeroform-HBD 2x2 (in/in) as directed Secondary Dressing ABD Pad 5x9 (in/in) Discharge Instruction: Cover with ABD pad Secured With Conform 4'' - Conforming Stretch Gauze Bandage 4x75 (in/in) Discharge Instruction: Apply as directed Tubigrip Size E, 3.5x10 (in/yds) Discharge Instruction: Apply 3 Tubigrip E 3-finger-widths below knee to base of toes to  secure dressing and/or for swelling. Compression Wrap Compression Stockings [name], [name] ([id number]) [id number] Page 10 of 10 Add-Ons Electronic Signature(s) Signed: 04/11/2022 12:25:01 PM By: [name] Coria RN Signed: 04/17/2022 11:24:03 AM By: [name] Entered By: [name] on 04/10/2022 10:34:38 -------------------------------------------------------------------------------- Vitals Details Patient Name: Date of Service: [name], [name]. 04/10/2022 10:30 A M Medical  Record Number: [id number] Patient Account Number: [id number] Date of Birth/Sex: Treating RN: 09-27-1940 (81 y.o. [name]) [name] Primary Care Kandyce Dieguez: [name] Other Clinician: Massie [name] Referring Joeann Steppe: Treating Oceana Walthall/Extender: [name] in Treatment: 12 Vital Signs Time Taken: 10:21 Temperature (F): 97.8 Height (in): 74 Pulse (bpm): 60 Weight (lbs): 244 Respiratory Rate (breaths/min): 18 Body Mass Index (BMI): 31.3 Blood Pressure (mmHg): 121/68 Reference Range: 80 - 120 mg / dl Electronic Signature(s) Signed: 04/17/2022 11:24:03 AM By: [name] Entered By: [name] on 04/10/2022 10:23:54

## 2022-04-24 ENCOUNTER — Encounter: Payer: PPO | Admitting: Physician Assistant

## 2022-04-24 DIAGNOSIS — I87333 Chronic venous hypertension (idiopathic) with ulcer and inflammation of bilateral lower extremity: Secondary | ICD-10-CM | POA: Diagnosis not present

## 2022-04-24 DIAGNOSIS — G9009 Other idiopathic peripheral autonomic neuropathy: Secondary | ICD-10-CM | POA: Diagnosis not present

## 2022-04-24 DIAGNOSIS — L97512 Non-pressure chronic ulcer of other part of right foot with fat layer exposed: Secondary | ICD-10-CM | POA: Diagnosis not present

## 2022-04-24 NOTE — Progress Notes (Addendum)
GEO, SLONE ([id number]) [id number] Page 1 of 10 Visit Report for 04/24/2022 Chief Complaint Document Details Patient Name: Date of Service: [name] [facility] [name]. 04/24/2022 8:30 A M Medical Record Number: [id number] Patient Account Number: [id number] Date of Birth/Sex: Treating RN: Dec 06, 1940 (81 y.o. [name] Primary Care Provider: [name] Other Clinician: Referring Provider: Treating Provider/Extender: [name] in Treatment: 14 Information Obtained from: Patient Chief Complaint Multiple LE Ulcers Electronic Signature(s) Signed: 04/24/2022 4:52:35 PM By: Rosalio Loud MSN RN CNS WTA Signed: 04/24/2022 5:00:04 PM By: Worthy Keeler PA-C Previous Signature: 04/24/2022 8:23:58 AM Version By: Worthy Keeler PA-C Entered By: Rosalio Loud on 04/24/2022 09:09:00 -------------------------------------------------------------------------------- Debridement Details Patient Name: Date of Service: [name] [facility] [name]. 04/24/2022 8:30 A M Medical Record Number: [id number] Patient Account Number: [id number] Date of Birth/Sex: Treating RN: 11-Mar-1941 (81 y.o. [name] Primary Care Provider: [name] Other Clinician: Referring Provider: Treating Provider/Extender: [name] in Treatment: 14 Debridement Performed for Assessment: Wound #2 Right,Plantar Foot Performed By: Physician Tommie Sams., PA-C Debridement Type: Debridement Level of Consciousness (Pre-procedure): Awake and Alert Pre-procedure Verification/Time Out Yes - 09:05 Taken: Start Time: 09:05 T Area Debrided (L x W): otal 0.5 (cm) x 0.5 (cm) = 0.25 (cm) Tissue and other material debrided: Non-Viable, Callus Level: Non-Viable Tissue Debridement Description: Selective/Open Wound Instrument: Curette Bleeding: None Response to Treatment: Procedure was tolerated well Level of Consciousness (Post- Awake and  Alert procedure): [name] ([id number]) [id number] Page 2 of 10 Post Debridement Measurements of Total Wound Length: (cm) 0.5 Width: (cm) 0.5 Depth: (cm) 0.4 Volume: (cm) 0.079 Character of Wound/Ulcer Post Debridement: Stable Post Procedure Diagnosis Same as Pre-procedure Electronic Signature(s) Signed: 04/24/2022 4:52:35 PM By: Rosalio Loud MSN RN CNS WTA Signed: 04/24/2022 5:00:04 PM By: Worthy Keeler PA-C Entered By: Rosalio Loud on 04/24/2022 09:06:43 -------------------------------------------------------------------------------- HPI Details Patient Name: Date of Service: [name], RA [facility] [name]. 04/24/2022 8:30 A M Medical Record Number: [id number] Patient Account Number: [id number] Date of Birth/Sex: Treating RN: Aug 05, 1940 (81 y.o. [name] Primary Care Provider: [name] Other Clinician: Referring Provider: Treating Provider/Extender: [name] in Treatment: 14 History of Present Illness HPI Description: 01-16-2022 upon evaluation today patient presents for initial inspection here in the clinic concerning issues that he has been having with wounds over the bilateral lower extremities and bilateral feet. Fortunately there does not appear to be any signs of significant infection at this point which [name] great news. Unfortunately his ABIs were registering at 0.49 on the left and 0.57 on the right here in the clinic on the screening today. With that being said I do think that it may possibly be true that we need to get him to formal arterial studies in order to see how things really are showing up. And the patient's not in disagreement with this for that reason we are going to make that referral as well. With that being said he does have some significant past medical history items of note detailed below. Patient does have chronic kidney disease stage IIIb, coronary artery disease, long-term use of anticoagulant  therapy, dilated cardiomyopathy. Proximal atrial fibrillation, he [name] on Xarelto long-term. He also has congestive heart failure, hypertension, peripheral neuropathy not related to diabetes as he [name] only prediabetic with a hemoglobin A1c of 5.8, and chronic venous hypertension. He has recently seen Dr. [name] show his cardiologist who did discontinue the hydrochlorothiazide and the Lasix  and felt that the patient was retaining more fluid than he should be. For that reason he was started on furosemide 20 mg twice daily. 7/27; patient with bilateral lower extremity wounds. These are largely venous although he had very poor ABIs on screening test here. He has an appointment apparently tomorrow at vein and vascular for arterial studies. He was not too keen on the latter although I think I emphasized the need to do this both in terms of dealing with the current wounds and being prepared for further issues down the road. We have been using silver alginate on the wounds and Tubigrip's edema control [name] better per our intake nurse 02-06-2022 upon evaluation today patient appears to be doing well with regard to his wound. Fortunately there [name] no signs of infection he [name] actually healing for the most part on the bilateral feet. His legs are also doing much better though he has 1 new area that popped up on the right leg. Fortunately there [name] no evidence of active infection of note his ABIs and TBI's were poor on the vascular screening on the 31st with the left being worse than the right. For that reason I am going to recommend he still continue to follow-up with vascular to discuss angioplasty/stenting. [id number] upon evaluation today patient appears to be doing well currently in regard to his wound on the leg although he has several other blisters that are showing up at this point. I do believe that we need to try to do something to get his swelling under control this could be of utmost importance as far as  getting this healed [name] concerned. 03-06-2022 upon evaluation today patient appears to be doing well currently in regard to his wounds. In fact the legs are drying up and seem to be healing quite nicely the lateral wounds are still weeping a little bit about the medial side actually appears to be pretty much dry and closed. The plantar aspect of his foot still [name] draining a bit but again it seems to be doing much better which [name] great news.[name] 03-13-2022 upon evaluation patient appears to be almost completely healed. His leg [name] doing excellent and his foot [name] just about closed. With that being said he did see vascular and they actually discussed the possibility of doing an arteriogram/angiogram in order to see if anything needed to be the balloons were stented open. With that being said he decided not to do that since things are healing so well right now and he [name] going to consider that for future they plan to see him back in November. 03-21-2022 upon evaluation today patient appears to be doing a little worse in regard to his wound on the foot. The wound keeps seeming to pull together unfortunately which [name] causing a trapped some fluid [name] just not healing quite as well as would like to see. With that being said I am going to try to remove a bit more of the overgrowth callus tissue today and hopefully that can help get this to seal up much more effectively and quickly. Fortunately I see no signs of [name], [name] ([id number]) [id number] Page 3 of 10 infection of the foot although the leg on the right does seem to show some signs of erythema today I do think we need to address that currently. This looks to be cellulitis. 03-31-2022 upon evaluation today patient appears to be doing better in regard to the cellulitis I am pleased in that regard he [name] still  taking the antibiotics. Fortunately I do not see any evidence of active infection locally or systemically at this time which  [name] great news. No fevers, chills, nausea, vomiting, or diarrhea. 04-10-2022 upon evaluation today patient appears to be doing well currently in regard to his foot ulcer I think were very close to complete resolution which [name] great news. Fortunately I do not see any signs of active infection locally or systemically at this time. No fevers, chills, nausea, vomiting, or diarrhea. 04-24-2022 upon evaluation today patient appears to be doing about the same in regard to his foot ulcer. Were not really seeing much in the way of improvements here. With that being said I do believe that he [name] tolerating the dressing changes without complication. No fevers, chills, nausea, vomiting, or diarrhea. With that being said the wound on his right plantar foot just does not seem to be making progress here. I do believe that he may benefit from vascular intervention which we previously discussed but he [name] previously wanted to hold off on. However at this point I think that this [name] becoming more crucial and he did ask me about getting in touch with the vascular doctor in order to see if they can get him scheduled sooner his next follow-up until like the end of December. I am more than happy to do this as I think it would be definitely beneficial for him. Electronic Signature(s) Signed: 04/24/2022 9:10:13 AM By: Worthy Keeler PA-C Previous Signature: 04/24/2022 9:08:29 AM Version By: Worthy Keeler PA-C Entered By: Worthy Keeler on 04/24/2022 09:10:13 -------------------------------------------------------------------------------- Physical Exam Details Patient Name: Date of Service: [name] [facility] [name]. 04/24/2022 8:30 A M Medical Record Number: [id number] Patient Account Number: [id number] Date of Birth/Sex: Treating RN: 1941-01-28 (81 y.o. [name] Primary Care Provider: [name] Other Clinician: Referring Provider: Treating Provider/Extender: [name] in Treatment:  41 Constitutional Well-nourished and well-hydrated in no acute distress. Respiratory normal breathing without difficulty. Psychiatric this patient [name] able to make decisions and demonstrates good insight into disease process. Alert and Oriented x 3. pleasant and cooperative. Notes Upon inspection patient's wound bed actually showed signs of good granulation and epithelization at this point. Fortunately I do not see any evidence of active infection locally or systemically which [name] great news and overall I am extremely pleased with where we stand today. I do not think that he has trouble healing the wounds on his legs though he keeps developing blisters due to not being appropriately compressed which were somewhat limited in due to the fact that he does not have sufficient arterial flow. Electronic Signature(s) Signed: 04/24/2022 9:11:11 AM By: Worthy Keeler PA-C Entered By: Worthy Keeler on 04/24/2022 09:11:11 [name], [name] ([id number]) [id number] Page 4 of 10 -------------------------------------------------------------------------------- Physician Orders Details Patient Name: Date of Service: [name] [facility] [name]. 04/24/2022 8:30 A M Medical Record Number: [id number] Patient Account Number: [id number] Date of Birth/Sex: Treating RN: 08/25/1940 (81 y.o. [name] Primary Care Provider: [name] Other Clinician: Referring Provider: Treating Provider/Extender: [name] in Treatment: [phone] Verbal / Phone Orders: No Diagnosis Coding ICD-10 Coding Code Description [phone] Chronic venous hypertension (idiopathic) with ulcer and inflammation of bilateral lower extremity L97.812 Non-pressure chronic ulcer of other part of right lower leg with fat layer exposed L97.822 Non-pressure chronic ulcer of other part of left lower leg with fat layer exposed G90.09 Other idiopathic peripheral autonomic neuropathy L97.522 Non-pressure  chronic ulcer  of other part of left foot with fat layer exposed L97.512 Non-pressure chronic ulcer of other part of right foot with fat layer exposed I10 Essential (primary) hypertension I50.42 Chronic combined systolic (congestive) and diastolic (congestive) heart failure I48.0 Paroxysmal atrial fibrillation I42.0 Dilated cardiomyopathy Z79.01 Long term (current) use of anticoagulants I25.10 Atherosclerotic heart disease of native coronary artery without angina pectoris N18.32 Chronic kidney disease, stage 3b Follow-up Appointments Return Appointment in 1 week. Nurse Visit as needed Bathing/ Shower/ Hygiene May shower with wound dressing protected with water repellent cover or cast protector. Edema Control - Lymphedema / Segmental Compressive Device / Other Tubigrip double layer applied - tubi [name] double layer Elevate, Exercise Daily and A void Standing for Long Periods of Time. Elevate legs to the level of the heart and pump ankles as often as possible Elevate leg(s) parallel to the floor when sitting. Off-Loading Right Lower Extremity Open toe surgical shoe with peg assist. Wound Treatment Wound #2 - Foot Wound Laterality: Plantar, Right Cleanser: Wound Cleanser 3 x Per Week/30 Days Discharge Instructions: Wash your hands with soap and water. Remove old dressing, discard into plastic bag and place into trash. Cleanse the wound with Wound Cleanser prior to applying a clean dressing using gauze sponges, not tissues or cotton balls. Do not scrub or use excessive force. Pat dry using gauze sponges, not tissue or cotton balls. Prim Dressing: Prisma 4.34 (in) 3 x Per Week/30 Days ary Discharge Instructions: Moisten w/normal saline or sterile water; Cover wound as directed. Do not remove from wound bed. Secondary Dressing: Zetuvit Plus 4x8 (in/in) 3 x Per Week/30 Days Secured With: Tubigrip Size E, 3.5x10 (in/yds) 3 x Per Week/30 Days Discharge Instructions: Apply 3 Tubigrip E  3-finger-widths below knee to base of toes to secure dressing and/or for swelling. Wound #9 - Lower Leg Wound Laterality: Right, Lateral [name], [name] ([id number]) [id number] Page 5 of 10 Cleanser: Wound Cleanser 3 x Per Week/30 Days Discharge Instructions: Wash your hands with soap and water. Remove old dressing, discard into plastic bag and place into trash. Cleanse the wound with Wound Cleanser prior to applying a clean dressing using gauze sponges, not tissues or cotton balls. Do not scrub or use excessive force. Pat dry using gauze sponges, not tissue or cotton balls. Secondary Dressing: ABD Pad 5x9 (in/in) 3 x Per Week/30 Days Discharge Instructions: Cover with ABD pad Secured With: Conform 4'' - Conforming Stretch Gauze Bandage 4x75 (in/in) 3 x Per Week/30 Days Discharge Instructions: Apply as directed Secured With: Tubigrip Size E, 3.5x10 (in/yds) 3 x Per Week/30 Days Discharge Instructions: Apply 3 Tubigrip E 3-finger-widths below knee to base of toes to secure dressing and/or for swelling. Electronic Signature(s) Signed: 04/24/2022 4:52:35 PM By: Rosalio Loud MSN RN CNS WTA Signed: 04/24/2022 5:00:04 PM By: Worthy Keeler PA-C Entered By: Rosalio Loud on 04/24/2022 09:07:41 -------------------------------------------------------------------------------- Problem List Details Patient Name: Date of Service: [name] [facility] [name]. 04/24/2022 8:30 A M Medical Record Number: [id number] Patient Account Number: [id number] Date of Birth/Sex: Treating RN: 1941/03/05 (81 y.o. [name], [name] Primary Care Provider: [name] Other Clinician: Referring Provider: Treating Provider/Extender: [name] in Treatment: 14 Active Problems ICD-10 Encounter Code Description Active Date MDM Diagnosis I87.333 Chronic venous hypertension (idiopathic) with ulcer and inflammation of 01/16/2022 No Yes bilateral lower extremity L97.812 Non-pressure  chronic ulcer of other part of right lower leg with fat layer 01/16/2022 No Yes exposed L97.822 Non-pressure chronic ulcer of other part of left lower  leg with fat layer exposed 01/16/2022 No Yes G90.09 Other idiopathic peripheral autonomic neuropathy 01/16/2022 No Yes L97.522 Non-pressure chronic ulcer of other part of left foot with fat layer exposed 01/16/2022 No Yes L97.512 Non-pressure chronic ulcer of other part of right foot with fat layer exposed 01/16/2022 No Yes I10 Essential (primary) hypertension 01/16/2022 No Yes Jagodzinski, Kenaz [name] ([id number]) [id number] Page 6 of 10 I50.42 Chronic combined systolic (congestive) and diastolic (congestive) heart failure 01/16/2022 No Yes I48.0 Paroxysmal atrial fibrillation 01/16/2022 No Yes I42.0 Dilated cardiomyopathy 01/16/2022 No Yes Z79.01 Long term (current) use of anticoagulants 01/16/2022 No Yes I25.10 Atherosclerotic heart disease of native coronary artery without angina pectoris 01/16/2022 No Yes N18.32 Chronic kidney disease, stage 3b 01/16/2022 No Yes Inactive Problems Resolved Problems Electronic Signature(s) Signed: 04/24/2022 4:52:35 PM By: Rosalio Loud MSN RN CNS WTA Signed: 04/24/2022 5:00:04 PM By: Worthy Keeler PA-C Previous Signature: 04/24/2022 8:23:50 AM Version By: Worthy Keeler PA-C Entered By: Rosalio Loud on 04/24/2022 09:08:52 -------------------------------------------------------------------------------- Progress Note Details Patient Name: Date of Service: [name], RA [facility] [name]. 04/24/2022 8:30 A M Medical Record Number: [id number] Patient Account Number: [id number] Date of Birth/Sex: Treating RN: [birth year] (81 y.o. [name] Primary Care Provider: [name] Other Clinician: Referring Provider: Treating Provider/Extender: [name] in Treatment: 14 Subjective Chief Complaint Information obtained from Patient Multiple LE Ulcers History of Present Illness  (HPI) 01-16-2022 upon evaluation today patient presents for initial inspection here in the clinic concerning issues that he has been having with wounds over the bilateral lower extremities and bilateral feet. Fortunately there does not appear to be any signs of significant infection at this point which [name] great news. Unfortunately his ABIs were registering at 0.49 on the left and 0.57 on the right here in the clinic on the screening today. With that being said I do think that it may possibly be true that we need to get him to formal arterial studies in order to see how things really are showing up. And the patient's not in disagreement with this for that reason we are going to make that referral as well. With that being said he does have some significant past medical history items of note detailed below. Patient does have chronic kidney disease stage IIIb, coronary artery disease, long-term use of anticoagulant therapy, dilated cardiomyopathy. Proximal atrial fibrillation, he [name] on Xarelto long-term. He also has congestive heart failure, hypertension, peripheral neuropathy not related to diabetes as he [name] only prediabetic [name], [name] ([id number]) [id number] Page 7 of 10 with a hemoglobin A1c of 5.8, and chronic venous hypertension. He has recently seen Dr. [name] show his cardiologist who did discontinue the hydrochlorothiazide and the Lasix and felt that the patient was retaining more fluid than he should be. For that reason he was started on furosemide 20 mg twice daily. 7/27; patient with bilateral lower extremity wounds. These are largely venous although he had very poor ABIs on screening test here. He has an appointment apparently tomorrow at vein and vascular for arterial studies. He was not too keen on the latter although I think I emphasized the need to do this both in terms of dealing with the current wounds and being prepared for further issues down the road.  We have been using silver alginate on the wounds and Tubigrip's edema control [name] better per our intake nurse 02-06-2022 upon evaluation today patient appears to be doing well with regard to his wound. Fortunately there [name] no signs of  infection he [name] actually healing for the most part on the bilateral feet. His legs are also doing much better though he has 1 new area that popped up on the right leg. Fortunately there [name] no evidence of active infection of note his ABIs and TBI's were poor on the vascular screening on the 31st with the left being worse than the right. For that reason I am going to recommend he still continue to follow-up with vascular to discuss angioplasty/stenting. 8-124-23 upon evaluation today patient appears to be doing well currently in regard to his wound on the leg although he has several other blisters that are showing up at this point. I do believe that we need to try to do something to get his swelling under control this could be of utmost importance as far as getting this healed [name] concerned. 03-06-2022 upon evaluation today patient appears to be doing well currently in regard to his wounds. In fact the legs are drying up and seem to be healing quite nicely the lateral wounds are still weeping a little bit about the medial side actually appears to be pretty much dry and closed. The plantar aspect of his foot still [name] draining a bit but again it seems to be doing much better which [name] great news.[name] 03-13-2022 upon evaluation patient appears to be almost completely healed. His leg [name] doing excellent and his foot [name] just about closed. With that being said he did see vascular and they actually discussed the possibility of doing an arteriogram/angiogram in order to see if anything needed to be the balloons were stented open. With that being said he decided not to do that since things are healing so well right now and he [name] going to consider that for future they plan to see him back in  November. 03-21-2022 upon evaluation today patient appears to be doing a little worse in regard to his wound on the foot. The wound keeps seeming to pull together unfortunately which [name] causing a trapped some fluid [name] just not healing quite as well as would like to see. With that being said I am going to try to remove a bit more of the overgrowth callus tissue today and hopefully that can help get this to seal up much more effectively and quickly. Fortunately I see no signs of infection of the foot although the leg on the right does seem to show some signs of erythema today I do think we need to address that currently. This looks to be cellulitis. 03-31-2022 upon evaluation today patient appears to be doing better in regard to the cellulitis I am pleased in that regard he [name] still taking the antibiotics. Fortunately I do not see any evidence of active infection locally or systemically at this time which [name] great news. No fevers, chills, nausea, vomiting, or diarrhea. 04-10-2022 upon evaluation today patient appears to be doing well currently in regard to his foot ulcer I think were very close to complete resolution which [name] great news. Fortunately I do not see any signs of active infection locally or systemically at this time. No fevers, chills, nausea, vomiting, or diarrhea. 04-24-2022 upon evaluation today patient appears to be doing about the same in regard to his foot ulcer. Were not really seeing much in the way of improvements here. With that being said I do believe that he [name] tolerating the dressing changes without complication. No fevers, chills, nausea, vomiting, or diarrhea. With that being said the wound on his right plantar  foot just does not seem to be making progress here. I do believe that he may benefit from vascular intervention which we previously discussed but he [name] previously wanted to hold off on. However at this point I think that this [name] becoming more crucial and he did ask me  about getting in touch with the vascular doctor in order to see if they can get him scheduled sooner his next follow-up until like the end of December. I am more than happy to do this as I think it would be definitely beneficial for him. Objective Constitutional Well-nourished and well-hydrated in no acute distress. Vitals Time Taken: 8:32 AM, Height: 74 in, Weight: 244 lbs, BMI: 31.3, Temperature: 97.9 F, Pulse: 57 bpm, Respiratory Rate: 16 breaths/min, Blood Pressure: 121/59 mmHg. Respiratory normal breathing without difficulty. Psychiatric this patient [name] able to make decisions and demonstrates good insight into disease process. Alert and Oriented x 3. pleasant and cooperative. General Notes: Upon inspection patient's wound bed actually showed signs of good granulation and epithelization at this point. Fortunately I do not see any evidence of active infection locally or systemically which [name] great news and overall I am extremely pleased with where we stand today. I do not think that he has trouble healing the wounds on his legs though he keeps developing blisters due to not being appropriately compressed which were somewhat limited in due to the fact that he does not have sufficient arterial flow. Integumentary (Hair, Skin) Wound #2 status [name] Open. Original cause of wound was Gradually Appeared. The date acquired was: 12/28/2020. The wound has been in treatment 14 weeks. The wound [name] located on the [place]. The wound measures 0.5cm length x 0.5cm width x 0.4cm depth; 0.196cm^2 area and 0.079cm^3 volume. There [name] Fat Layer (Subcutaneous Tissue) exposed. There [name] a medium amount of serosanguineous drainage noted. There [name] small (1-33%) red granulation within the wound bed. There [name] no necrotic tissue within the wound bed. Wound #9 status [name] Open. Original cause of wound was Skin T ear/Laceration. The date acquired was: 04/09/2022. The wound has been in treatment 2 weeks. The wound [name]  located on the Right,Lateral Lower Leg. The wound measures 1cm length x 1cm width x 0.1cm depth; 0.785cm^2 area and 0.079cm^3 volume. There [name] Fat Layer (Subcutaneous Tissue) exposed. There [name] a medium amount of serosanguineous drainage noted. The wound margin [name] flat and intact. There [name] no granulation within the wound bed. There [name] no necrotic tissue within the wound bed. [name], [name] ([id number]) [id number] Page 8 of 10 Assessment Active Problems ICD-10 Chronic venous hypertension (idiopathic) with ulcer and inflammation of bilateral lower extremity Non-pressure chronic ulcer of other part of right lower leg with fat layer exposed Non-pressure chronic ulcer of other part of left lower leg with fat layer exposed Other idiopathic peripheral autonomic neuropathy Non-pressure chronic ulcer of other part of left foot with fat layer exposed Non-pressure chronic ulcer of other part of right foot with fat layer exposed Essential (primary) hypertension Chronic combined systolic (congestive) and diastolic (congestive) heart failure Paroxysmal atrial fibrillation Dilated cardiomyopathy Long term (current) use of anticoagulants Atherosclerotic heart disease of native coronary artery without angina pectoris Chronic kidney disease, stage 3b Procedures Wound #2 Pre-procedure diagnosis of Wound #2 [name] a Neuropathic Ulcer-Non Diabetic located on the [place] . There was a Selective/Open Wound Non-Viable Tissue Debridement with a total area of 0.25 sq cm performed by Tommie Sams., PA-C. With the following instrument(s): Curette to remove Non-Viable tissue/material.  Material removed includes Callus. A time out was conducted at 09:05, prior to the start of the procedure. There was no bleeding. The procedure was tolerated well. Post Debridement Measurements: 0.5cm length x 0.5cm width x 0.4cm depth; 0.079cm^3 volume. Character of Wound/Ulcer Post Debridement [name]  stable. Post procedure Diagnosis Wound #2: Same as Pre-Procedure Plan Follow-up Appointments: Return Appointment in 1 week. Nurse Visit as needed Bathing/ Shower/ Hygiene: May shower with wound dressing protected with water repellent cover or cast protector. Edema Control - Lymphedema / Segmental Compressive Device / Other: Tubigrip double layer applied - tubi [name] double layer Elevate, Exercise Daily and Avoid Standing for Long Periods of Time. Elevate legs to the level of the heart and pump ankles as often as possible Elevate leg(s) parallel to the floor when sitting. Off-Loading: Open toe surgical shoe with peg assist. WOUND #2: - Foot Wound Laterality: Plantar, Right Cleanser: Wound Cleanser 3 x Per Week/30 Days Discharge Instructions: Wash your hands with soap and water. Remove old dressing, discard into plastic bag and place into trash. Cleanse the wound with Wound Cleanser prior to applying a clean dressing using gauze sponges, not tissues or cotton balls. Do not scrub or use excessive force. Pat dry using gauze sponges, not tissue or cotton balls. Prim Dressing: Prisma 4.34 (in) 3 x Per Week/30 Days ary Discharge Instructions: Moisten w/normal saline or sterile water; Cover wound as directed. Do not remove from wound bed. Secondary Dressing: Zetuvit Plus 4x8 (in/in) 3 x Per Week/30 Days Secured With: Tubigrip Size E, 3.5x10 (in/yds) 3 x Per Week/30 Days Discharge Instructions: Apply 3 Tubigrip E 3-finger-widths below knee to base of toes to secure dressing and/or for swelling. WOUND #9: - Lower Leg Wound Laterality: Right, Lateral Cleanser: Wound Cleanser 3 x Per Week/30 Days Discharge Instructions: Wash your hands with soap and water. Remove old dressing, discard into plastic bag and place into trash. Cleanse the wound with Wound Cleanser prior to applying a clean dressing using gauze sponges, not tissues or cotton balls. Do not scrub or use excessive force. Pat dry using gauze  sponges, not tissue or cotton balls. Secondary Dressing: ABD Pad 5x9 (in/in) 3 x Per Week/30 Days Discharge Instructions: Cover with ABD pad Secured With: Conform 4'' - Conforming Stretch Gauze Bandage 4x75 (in/in) 3 x Per Week/30 Days Discharge Instructions: Apply as directed Secured With: Tubigrip Size E, 3.5x10 (in/yds) 3 x Per Week/30 Days Discharge Instructions: Apply 3 Tubigrip E 3-finger-widths below knee to base of toes to secure dressing and/or for swelling. 1. Based on what I am seeing currently I would recommend we continue with the collagen for the foot. I do not believe this [name] primarily a dressing failure I think that this [name] more of a arterial flow failure I think that he has difficulty healing because of this. He does want to get into vascular sooner to get the procedure done as far as the angiogram [name] concerned. I Minna see about getting in touch with them to try to make this happen much more quickly. 2. I am also can recommend based on what we are seeing currently that we have the patient continue with the ABD pads to cover over the legs where she has blisters really there [name] no need for Xeroform at this point. We will also continue with the Tubigrip size E which [name] helping some with control the fluid and swelling but not as much as he really needs. We will see patient back for reevaluation in 1 week  here in the clinic. If anything worsens or changes patient will contact our office for additional recommendations. [name], [name] ([id number]) [id number] Page 9 of 10 I did contact [facility] and I was able to speak with them today they are going to go ahead and see about getting him set up for the angiogram as quickly as possible I told the patient to be looking out for the recall. Electronic Signature(s) Signed: 04/24/2022 4:57:09 PM By: Worthy Keeler PA-C Previous Signature: 04/24/2022 9:12:57 AM Version By: Worthy Keeler  PA-C Entered By: Worthy Keeler on 04/24/2022 16:57:09 -------------------------------------------------------------------------------- SuperBill Details Patient Name: Date of Service: [name], RA LPH [name]. 04/24/2022 Medical Record Number: [id number] Patient Account Number: [id number] Date of Birth/Sex: Treating RN: 01-07-1941 (81 y.o. [name], [name] Primary Care Provider: [name] Other Clinician: Referring Provider: Treating Provider/Extender: [name] in Treatment: 14 Diagnosis Coding ICD-10 Codes Code Description [id number] Chronic venous hypertension (idiopathic) with ulcer and inflammation of bilateral lower extremity L97.812 Non-pressure chronic ulcer of other part of right lower leg with fat layer exposed L97.822 Non-pressure chronic ulcer of other part of left lower leg with fat layer exposed G90.09 Other idiopathic peripheral autonomic neuropathy L97.522 Non-pressure chronic ulcer of other part of left foot with fat layer exposed L97.512 Non-pressure chronic ulcer of other part of right foot with fat layer exposed I10 Essential (primary) hypertension I50.42 Chronic combined systolic (congestive) and diastolic (congestive) heart failure I48.0 Paroxysmal atrial fibrillation I42.0 Dilated cardiomyopathy Z79.01 Long term (current) use of anticoagulants I25.10 Atherosclerotic heart disease of native coronary artery without angina pectoris N18.32 Chronic kidney disease, stage 3b Facility Procedures : CPT4 Code: [id number] Description: 02409 - DEBRIDE WOUND 1ST 20 SQ CM OR < ICD-10 Diagnosis Description L97.812 Non-pressure chronic ulcer of other part of right lower leg with fat layer expos Modifier: ed Quantity: 1 Physician Procedures : CPT4 Code Description Modifier [id number] - WC PHYS LEVEL 3 - EST PT 25 ICD-10 Diagnosis Description I87.333 Chronic venous hypertension (idiopathic) with ulcer and inflammation of bilateral lower extremity L97.812  Non-pressure chronic ulcer of  other part of right lower leg with fat layer exposed L97.822 Non-pressure chronic ulcer of other part of left lower leg with fat layer exposed G90.09 Other idiopathic peripheral autonomic neuropathy Quantity: 1 : [id number] - WC PHYS DEBR WO ANESTH 20 SQ CM ICD-10 Diagnosis Description MUZAMMIL, BRUINS [name] ([id number]) [id number] G92.119 Non-pressure chronic ulcer of other part of right lower leg with fat layer exposed Quantity: 1 pdf Page 10 of 10 Electronic Signature(s) Signed: 04/24/2022 4:57:29 PM By: Worthy Keeler PA-C Previous Signature: 04/24/2022 9:34:47 AM Version By: Worthy Keeler PA-C Entered By: Worthy Keeler on 04/24/2022 16:57:28

## 2022-04-24 NOTE — Progress Notes (Addendum)
PEARL, BERLINGER ([id number]) [id number] Page 1 of 10 Visit Report for 04/24/2022 Arrival Information Details Patient Name: Date of Service: [name] [facility] [name]. 04/24/2022 8:30 A M Medical Record Number: [id number] Patient Account Number: [id number] Date of Birth/Sex: Treating RN: 06/04/1941 (81 y.o. [name] Primary Care [name]: [name] Other Clinician: Referring [name]: Treating [name]/Extender: [name] in Treatment: 40 Visit Information History Since Last Visit Added or deleted any medications: No Patient Arrived: [name] Any new allergies or adverse reactions: No Arrival Time: 08:27 Had a fall or experienced change in No Accompanied By: self activities of daily living that may affect Transfer Assistance: None risk of falls: Patient Requires Transmission-Based Precautions: No Hospitalized since last visit: No Patient Has Alerts: No Pain Present Now: No Electronic Signature(s) Signed: 04/24/2022 4:52:35 PM By: [name] Loud MSN RN CNS WTA Entered By: [name] on 04/24/2022 08:32:08 -------------------------------------------------------------------------------- Clinic Level of Care Assessment Details Patient Name: Date of Service: [name] [facility] [name]. 04/24/2022 8:30 A M Medical Record Number: [id number] Patient Account Number: [id number] Date of Birth/Sex: Treating RN: 1941-05-28 (81 y.o. [name] Primary Care Saraya Tirey: [name] Other Clinician: Referring [name]: Treating [name]/Extender: [name] in Treatment: 14 Clinic Level of Care Assessment Items TOOL 1 Quantity Score []  - 0 Use when EandM and Procedure is performed on INITIAL visit ASSESSMENTS - Nursing Assessment / Reassessment []  - 0 General Physical Exam (combine w/ comprehensive assessment (listed just below) when performed on new pt. evals) []  - 0 Comprehensive Assessment (HX, ROS, Risk Assessments,  Wounds Hx, etc.) ASSESSMENTS - Wound and Skin Assessment / Reassessment []  - 0 Dermatologic / Skin Assessment (not related to wound area) ASSESSMENTS - Ostomy and/or Continence Assessment and Care []  - 0 Incontinence Assessment and Management [name] ([id number]) [id number] Page 2 of 10 []  - 0 Ostomy Care Assessment and Management (repouching, etc.) PROCESS - Coordination of Care []  - 0 Simple Patient / Family Education for ongoing care []  - 0 Complex (extensive) Patient / Family Education for ongoing care []  - 0 Staff obtains Programmer, systems, Records, T Results / Process Orders est []  - 0 Staff telephones HHA, Nursing Homes / Clarify orders / etc []  - 0 Routine Transfer to another Facility (non-emergent condition) []  - 0 Routine Hospital Admission (non-emergent condition) []  - 0 New Admissions / Biomedical engineer / Ordering NPWT Apligraf, etc. , []  - 0 Emergency Hospital Admission (emergent condition) PROCESS - Special Needs []  - 0 Pediatric / Minor Patient Management []  - 0 Isolation Patient Management []  - 0 Hearing / Language / Visual special needs []  - 0 Assessment of Community assistance (transportation, [name]/C planning, etc.) []  - 0 Additional assistance / Altered mentation []  - 0 Support Surface(s) Assessment (bed, cushion, seat, etc.) INTERVENTIONS - Miscellaneous []  - 0 External ear exam []  - 0 Patient Transfer (multiple staff / Civil Service fast streamer / Similar devices) []  - 0 Simple Staple / Suture removal (25 or less) []  - 0 Complex Staple / Suture removal (26 or more) []  - 0 Hypo/Hyperglycemic Management (do not check if billed separately) []  - 0 Ankle / Brachial Index (ABI) - do not check if billed separately Has the patient been seen at the hospital within the last three years: Yes Total Score: 0 Level Of Care: ____ Electronic Signature(s) Signed: 04/24/2022 4:52:35 PM By: [name] Loud MSN RN CNS WTA Entered By: [name] on 04/24/2022 09:07:49 -------------------------------------------------------------------------------- Encounter Discharge Information Details Patient Name: Date  of Service: [name] [place] [facility] [name]. 04/24/2022 8:30 A M Medical Record Number: [id number] Patient Account Number: [id number] Date of Birth/Sex: Treating RN: 01/14/1941 (81 y.o. [name] Primary Care [name]: [name] Other Clinician: Referring [name]: Treating [name]/Extender: [name] in Treatment: [phone] Encounter Discharge Information Items Post Procedure Vitals Discharge Condition: Stable Temperature (F): 97.9 Ambulatory Status: Walker Pulse (bpm): [place], [name] ([id number]) [id number] Page 3 of 10 Discharge Destination: Home Respiratory Rate (breaths/min): 16 Transportation: Private Auto Blood Pressure (mmHg): 121/59 Schedule Follow-up Appointment: Yes Clinical Summary of Care: Electronic Signature(s) Signed: 04/24/2022 4:52:35 PM By: [name] Loud MSN RN CNS WTA Entered By: [name] on 04/24/2022 09:10:30 -------------------------------------------------------------------------------- Lower Extremity Assessment Details Patient Name: Date of Service: [name] [facility] [name]. 04/24/2022 8:30 A M Medical Record Number: [id number] Patient Account Number: [id number] Date of Birth/Sex: Treating RN: 06/10/1941 (81 y.o. [name] Primary Care [name]: [name] Other Clinician: Referring [name]: Treating [name]/Extender: [name] Weeks in Treatment: 14 Edema Assessment Assessed: [name]: No] [name]: Yes] [Left: Edema] [Right: :] Calf Left: Right: Point of Measurement: 38 cm From Medial Instep 40 cm Ankle Left: Right: Point of Measurement: 15 cm From Medial Instep 28.3 cm Vascular Assessment Pulses: Dorsalis Pedis Palpable: [Right:Yes] Electronic Signature(s) Signed: 04/24/2022 4:52:35 PM By: [name] Loud MSN RN  CNS WTA Entered By: [name] on 04/24/2022 08:49:19 -------------------------------------------------------------------------------- Multi Wound Chart Details Patient Name: Date of Service: [name], RA LPH [name]. 04/24/2022 8:30 A M Medical Record Number: [id number] Patient Account Number: [id number] JIRAIYA, MCEWAN ([id number]) [id number] Page 4 of 10 Date of Birth/Sex: Treating RN: 1940-07-28 (81 y.o. [name] Primary Care Arly Salminen: [name] Other Clinician: Referring Beda Dula: Treating Talmadge Ganas/Extender: [name] in Treatment: 14 Vital Signs Height(in): 74 Pulse(bpm): 3 Weight(lbs): 244 Blood Pressure(mmHg): 121/59 Body Mass Index(BMI): 31.3 Temperature(F): 97.9 Respiratory Rate(breaths/min): 16 [2:Photos:] [N/A:N/A] Right, Plantar Foot Right, Lateral Lower Leg N/A Wound Location: Gradually Appeared Skin T ear/Laceration N/A Wounding Event: Neuropathic Ulcer-Non Diabetic Skin T ear N/A Primary Etiology: Congestive Heart Failure, Congestive Heart Failure, N/A Comorbid History: Hypertension Hypertension 12/28/2020 04/09/2022 N/A Date Acquired: 14 2 N/A Weeks of Treatment: Open Open N/A Wound Status: No No N/A Wound Recurrence: 0.5x0.5x0.4 1x1x0.1 N/A Measurements L x W x [name] (cm) 0.196 0.785 N/A A (cm) : rea 0.079 0.079 N/A Volume (cm) : -148.10% -53.60% N/A % Reduction in Area: -229.20% -54.90% N/A % Reduction in Volume: Full Thickness Without Exposed Partial Thickness N/A Classification: Support Structures Medium Medium N/A Exudate Amount: Serosanguineous Serosanguineous N/A Exudate Type: red, brown red, brown N/A Exudate Color: N/A Flat and Intact N/A Wound Margin: Small (1-33%) None Present (0%) N/A Granulation Amount: Red N/A N/A Granulation Quality: None Present (0%) None Present (0%) N/A Necrotic Amount: Fat Layer (Subcutaneous Tissue): Yes Fat Layer (Subcutaneous Tissue): Yes  N/A Exposed Structures: Fascia: No Fascia: No Tendon: No Tendon: No Muscle: No Muscle: No Joint: No Joint: No Bone: No Bone: No Medium (34-66%) None N/A Epithelialization: Treatment Notes Electronic Signature(s) Signed: 04/24/2022 4:52:35 PM By: [name] Loud MSN RN CNS WTA Entered By: [name] on 04/24/2022 09:05:14 -------------------------------------------------------------------------------- Multi-Disciplinary Care Plan Details Patient Name: Date of Service: [name], RA LPH [name]. 04/24/2022 8:30 A Georgia Duff, Deidre Ala [name] ([id number]) [id number] Page 5 of 10 Medical Record Number: [id number] Patient Account Number: [id number] Date of Birth/Sex: Treating RN: 1941-02-19 (81 y.o. [name] Primary Care Lashonta Pilling: [name] Other Clinician: Referring Brielynn Sekula: Treating Gunda Maqueda/Extender:  Stone, Eilleen Kempf Weeks in Treatment: 14 Active Inactive Necrotic Tissue Nursing Diagnoses: Impaired tissue integrity related to necrotic/devitalized tissue Knowledge deficit related to management of necrotic/devitalized tissue Goals: Necrotic/devitalized tissue will be minimized in the wound bed Date Initiated: 03/31/2022 Target Resolution Date: 03/31/2022 Goal Status: Active Patient/caregiver will verbalize understanding of reason and process for debridement of necrotic tissue Date Initiated: 03/31/2022 Target Resolution Date: 03/31/2022 Goal Status: Active Interventions: Assess patient pain level pre-, during and post procedure and prior to discharge Provide education on necrotic tissue and debridement process Treatment Activities: Excisional debridement : 03/31/2022 Notes: Wound/Skin Impairment Nursing Diagnoses: Knowledge deficit related to ulceration/compromised skin integrity Goals: Patient/caregiver will verbalize understanding of skin care regimen Date Initiated: 01/16/2022 Target Resolution Date: 02/16/2022 Goal Status:  Active Ulcer/skin breakdown will have a volume reduction of 30% by week 4 Date Initiated: 01/16/2022 Target Resolution Date: 02/16/2022 Goal Status: Active Ulcer/skin breakdown will have a volume reduction of 50% by week 8 Date Initiated: 01/16/2022 Target Resolution Date: 03/19/2022 Goal Status: Active Ulcer/skin breakdown will have a volume reduction of 80% by week 12 Date Initiated: 01/16/2022 Target Resolution Date: 04/18/2022 Goal Status: Active Ulcer/skin breakdown will heal within 14 weeks Date Initiated: 01/16/2022 Target Resolution Date: 05/19/2022 Goal Status: Active Interventions: Assess patient/caregiver ability to obtain necessary supplies Assess patient/caregiver ability to perform ulcer/skin care regimen upon admission and as needed Assess ulceration(s) every visit Notes: Electronic Signature(s) Signed: 04/24/2022 4:52:35 PM By: [name] Loud MSN RN CNS WTA Entered By: [name] on 04/24/2022 09:05:05 Vivia Ewing [name] ([id number]) [id number] Page 6 of 10 -------------------------------------------------------------------------------- Pain Assessment Details Patient Name: Date of Service: [name] [facility] [name]. 04/24/2022 8:30 A M Medical Record Number: [id number] Patient Account Number: [id number] Date of Birth/Sex: Treating RN: 1940/11/21 (81 y.o. [name] Primary Care Duha Abair: [name] Other Clinician: Referring Agnes Probert: Treating Asahd Can/Extender: [name] in Treatment: 14 Active Problems Location of Pain Severity and Description of Pain Patient Has Paino No Site Locations Rate the pain. Current Pain Level: 0 Pain Management and Medication Current Pain Management: Electronic Signature(s) Signed: 04/24/2022 4:52:35 PM By: [name] Loud MSN RN CNS WTA Entered By: [name] on 04/24/2022 08:36:04 -------------------------------------------------------------------------------- Patient/Caregiver  Education Details Patient Name: Date of Service: [name] [facility] [name]. 10/26/2023andnbsp8:30 A M Medical Record Number: [id number] Patient Account Number: [id number] Date of Birth/Gender: Treating RN: January 19, 1941 (81 y.o. [name] Primary Care Physician: [name] Other Clinician: Referring Physician: Treating Physician/Extender: [name] in Treatment: [address], [place] [name] ([id number]) [id number] Page 7 of 10 Education Assessment Education Provided To: Patient Education Topics Provided Wound Debridement: Handouts: Wound Debridement Methods: Explain/Verbal Responses: State content correctly Electronic Signature(s) Signed: 04/24/2022 4:52:35 PM By: [name] Loud MSN RN CNS WTA Entered By: [name] on 04/24/2022 09:08:36 -------------------------------------------------------------------------------- Wound Assessment Details Patient Name: Date of Service: [name] [facility] [name]. 04/24/2022 8:30 A M Medical Record Number: [id number] Patient Account Number: [id number] Date of Birth/Sex: Treating RN: March 25, 1941 (81 y.o. [name] Primary Care Jahna Liebert: [name] Other Clinician: Referring Kealan Buchan: Treating Iden Stripling/Extender: [name] in Treatment: 14 Wound Status Wound Number: 2 Primary Etiology: Neuropathic Ulcer-Non Diabetic Wound Location: Right, Plantar Foot Wound Status: Open Wounding Event: Gradually Appeared Comorbid History: Congestive Heart Failure, Hypertension Date Acquired: 12/28/2020 Weeks Of Treatment: 14 Clustered Wound: No Photos Wound Measurements Length: (cm) 0.5 Width: (cm) 0.5 Depth: (cm) 0.4 Area: (cm) 0.196 Volume: (cm) 0.079 % Reduction in Area: -148.1% % Reduction  in Volume: -229.2% Epithelialization: Medium (34-66%) Wound Description Classification: Full Thickness Without Exposed Support Exudate Amount: Medium Exudate Type: Serosanguineous Turay, Damani [name]  ([id number]) Exudate Color: red, brown Structures Foul Odor After Cleansing: No Slough/Fibrino No [id number] Page 8 of 10 Wound Bed Granulation Amount: Small (1-33%) Exposed Structure Granulation Quality: Red Fascia Exposed: No Necrotic Amount: None Present (0%) Fat Layer (Subcutaneous Tissue) Exposed: Yes Tendon Exposed: No Muscle Exposed: No Joint Exposed: No Bone Exposed: No Treatment Notes Wound #2 (Foot) Wound Laterality: Plantar, Right Cleanser Wound Cleanser Discharge Instruction: Wash your hands with soap and water. Remove old dressing, discard into plastic bag and place into trash. Cleanse the wound with Wound Cleanser prior to applying a clean dressing using gauze sponges, not tissues or cotton balls. Do not scrub or use excessive force. Pat dry using gauze sponges, not tissue or cotton balls. Peri-Wound Care Topical Primary Dressing Prisma 4.34 (in) Discharge Instruction: Moisten w/normal saline or sterile water; Cover wound as directed. Do not remove from wound bed. Secondary Dressing Zetuvit Plus 4x8 (in/in) Secured With Tubigrip Size E, 3.5x10 (in/yds) Discharge Instruction: Apply 3 Tubigrip E 3-finger-widths below knee to base of toes to secure dressing and/or for swelling. Compression Wrap Compression Stockings Add-Ons Electronic Signature(s) Signed: 04/24/2022 4:52:35 PM By: [name] Loud MSN RN CNS WTA Entered By: [name] on 04/24/2022 09:04:44 -------------------------------------------------------------------------------- Wound Assessment Details Patient Name: Date of Service: [name] [facility] [name]. 04/24/2022 8:30 A M Medical Record Number: [id number] Patient Account Number: [id number] Date of Birth/Sex: Treating RN: June 11, 1941 (81 y.o. [name] Primary Care Trayvon Trumbull: [name] Other Clinician: Referring October Peery: Treating Mayo Faulk/Extender: [name] Weeks in Treatment: 14 Wound Status Wound  Number: 9 Primary Etiology: Skin Tear Wound Location: Right, Lateral Lower Leg Wound Status: Open Wounding Event: Skin Tear/Laceration Comorbid History: Congestive Heart Failure, Hypertension Date Acquired: 04/09/2022 Weeks Of Treatment: 2 Clustered Wound: No Witts, Belinda [name] ([id number]) [id number] Page 9 of 10 Photos Wound Measurements Length: (cm) 1 Width: (cm) 1 Depth: (cm) 0.1 Area: (cm) 0.785 Volume: (cm) 0.079 % Reduction in Area: -53.6% % Reduction in Volume: -54.9% Epithelialization: None Wound Description Classification: Partial Thickness Wound Margin: Flat and Intact Exudate Amount: Medium Exudate Type: Serosanguineous Exudate Color: red, brown Foul Odor After Cleansing: No Slough/Fibrino No Wound Bed Granulation Amount: None Present (0%) Exposed Structure Necrotic Amount: None Present (0%) Fascia Exposed: No Fat Layer (Subcutaneous Tissue) Exposed: Yes Tendon Exposed: No Muscle Exposed: No Joint Exposed: No Bone Exposed: No Treatment Notes Wound #9 (Lower Leg) Wound Laterality: Right, Lateral Cleanser Wound Cleanser Discharge Instruction: Wash your hands with soap and water. Remove old dressing, discard into plastic bag and place into trash. Cleanse the wound with Wound Cleanser prior to applying a clean dressing using gauze sponges, not tissues or cotton balls. Do not scrub or use excessive force. Pat dry using gauze sponges, not tissue or cotton balls. Peri-Wound Care Topical Primary Dressing Secondary Dressing ABD Pad 5x9 (in/in) Discharge Instruction: Cover with ABD pad Secured With Conform 4'' - Conforming Stretch Gauze Bandage 4x75 (in/in) Discharge Instruction: Apply as directed Tubigrip Size E, 3.5x10 (in/yds) Discharge Instruction: Apply 3 Tubigrip E 3-finger-widths below knee to base of toes to secure dressing and/or for swelling. Compression Wrap Compression Stockings Add-Ons Electronic Signature(s) Signed:  04/24/2022 4:52:35 PM By: [name] Loud MSN RN CNS WTA Entered By: [name] on 04/24/2022 08:47:23 Profitt, Deidre Ala [name] ([id number]) [id number] Page 10 of 10 -------------------------------------------------------------------------------- Vitals Details Patient Name: Date of Service: CRUMPTO N, RA  LPH [name]. 04/24/2022 8:30 A M Medical Record Number: [id number] Patient Account Number: [id number] Date of Birth/Sex: Treating RN: [birth year] (81 y.o. [name] Primary Care Avis Tirone: [name] Other Clinician: Referring Isack Lavalley: Treating Deniz Eskridge/Extender: [name] in Treatment: 14 Vital Signs Time Taken: 08:32 Temperature (F): 97.9 Height (in): 74 Pulse (bpm): 57 Weight (lbs): 244 Respiratory Rate (breaths/min): 16 Body Mass Index (BMI): 31.3 Blood Pressure (mmHg): 121/59 Reference Range: 80 - 120 mg / dl Electronic Signature(s) Signed: 04/24/2022 4:52:35 PM By: [name] Loud MSN RN CNS WTA Entered By: [name] on 04/24/2022 08:35:51

## 2022-04-29 ENCOUNTER — Telehealth (INDEPENDENT_AMBULATORY_CARE_PROVIDER_SITE_OTHER): Payer: Self-pay

## 2022-04-29 NOTE — Telephone Encounter (Signed)
Spoke with the patient and he is scheduled with Dr.Schnier on 05/20/22 for RLE angio at the [facility]. Pre-procedure instructions were discussed and will be mailed.

## 2022-05-01 ENCOUNTER — Encounter: Payer: PPO | Attending: Physician Assistant | Admitting: Physician Assistant

## 2022-05-01 DIAGNOSIS — N1832 Chronic kidney disease, stage 3b: Secondary | ICD-10-CM | POA: Insufficient documentation

## 2022-05-01 DIAGNOSIS — I2510 Atherosclerotic heart disease of native coronary artery without angina pectoris: Secondary | ICD-10-CM | POA: Insufficient documentation

## 2022-05-01 DIAGNOSIS — Z7901 Long term (current) use of anticoagulants: Secondary | ICD-10-CM | POA: Diagnosis not present

## 2022-05-01 DIAGNOSIS — I87331 Chronic venous hypertension (idiopathic) with ulcer and inflammation of right lower extremity: Secondary | ICD-10-CM | POA: Diagnosis not present

## 2022-05-01 DIAGNOSIS — L97812 Non-pressure chronic ulcer of other part of right lower leg with fat layer exposed: Secondary | ICD-10-CM | POA: Insufficient documentation

## 2022-05-01 DIAGNOSIS — G9009 Other idiopathic peripheral autonomic neuropathy: Secondary | ICD-10-CM | POA: Insufficient documentation

## 2022-05-01 DIAGNOSIS — I5042 Chronic combined systolic (congestive) and diastolic (congestive) heart failure: Secondary | ICD-10-CM | POA: Insufficient documentation

## 2022-05-01 DIAGNOSIS — I10 Essential (primary) hypertension: Secondary | ICD-10-CM | POA: Diagnosis not present

## 2022-05-01 DIAGNOSIS — L97522 Non-pressure chronic ulcer of other part of left foot with fat layer exposed: Secondary | ICD-10-CM | POA: Diagnosis not present

## 2022-05-01 DIAGNOSIS — I130 Hypertensive heart and chronic kidney disease with heart failure and stage 1 through stage 4 chronic kidney disease, or unspecified chronic kidney disease: Secondary | ICD-10-CM | POA: Insufficient documentation

## 2022-05-01 DIAGNOSIS — L97822 Non-pressure chronic ulcer of other part of left lower leg with fat layer exposed: Secondary | ICD-10-CM | POA: Insufficient documentation

## 2022-05-01 DIAGNOSIS — I872 Venous insufficiency (chronic) (peripheral): Secondary | ICD-10-CM | POA: Insufficient documentation

## 2022-05-01 DIAGNOSIS — L97512 Non-pressure chronic ulcer of other part of right foot with fat layer exposed: Secondary | ICD-10-CM | POA: Insufficient documentation

## 2022-05-01 DIAGNOSIS — I420 Dilated cardiomyopathy: Secondary | ICD-10-CM | POA: Diagnosis not present

## 2022-05-01 DIAGNOSIS — I480 Paroxysmal atrial fibrillation: Secondary | ICD-10-CM | POA: Insufficient documentation

## 2022-05-01 DIAGNOSIS — I87333 Chronic venous hypertension (idiopathic) with ulcer and inflammation of bilateral lower extremity: Secondary | ICD-10-CM | POA: Diagnosis not present

## 2022-05-01 NOTE — Progress Notes (Addendum)
FALLON, HOWERTER ([id number]) [id number] Page 1 of 8 Visit Report for 05/01/2022 Chief Complaint Document Details Patient Name: Date of Service: [name] [facility] [place] [name]. 05/01/2022 8:30 A M Medical Record Number: [id number] Patient Account Number: [id number] Date of Birth/Sex: Treating RN: September 28, 1940 (81 y.o. [name] Primary Care Provider: Tally [name] Other Clinician: Referring Provider: Treating Provider/Extender: [name] in Treatment: 15 Information Obtained from: Patient Chief Complaint Multiple LE Ulcers Electronic Signature(s) Signed: 05/01/2022 8:38:40 AM By: Worthy Keeler PA-C Entered By: Worthy Keeler on 05/01/2022 08:38:40 -------------------------------------------------------------------------------- HPI Details Patient Name: Date of Service: [name] [facility] [name]. 05/01/2022 8:30 A M Medical Record Number: [id number] Patient Account Number: [id number] Date of Birth/Sex: Treating RN: 03-04-1941 (81 y.o. [name] Primary Care Provider: Tally [name] Other Clinician: Referring Provider: Treating Provider/Extender: [name] in Treatment: 15 History of Present Illness HPI Description: 01-16-2022 upon evaluation today patient presents for initial inspection here in the clinic concerning issues that he has been having with wounds over the bilateral lower extremities and bilateral feet. Fortunately there does not appear to be any signs of significant infection at this point which is great news. Unfortunately his ABIs were registering at 0.49 on the left and 0.57 on the right here in the clinic on the screening today. With that being said I do think that it may possibly be true that we need to get him to formal arterial studies in order to see how things really are showing up. And the patient's not in disagreement with this for that reason we are going to make that referral as well. With that being  said he does have some significant past medical history items of note detailed below. Patient does have chronic kidney disease stage IIIb, coronary artery disease, long-term use of anticoagulant therapy, dilated cardiomyopathy. Proximal atrial fibrillation, he is on Xarelto long-term. He also has congestive heart failure, hypertension, peripheral neuropathy not related to diabetes as he is only prediabetic with a hemoglobin A1c of 5.8, and chronic venous hypertension. He has recently seen Dr. Tyler [name] show his cardiologist who did discontinue the hydrochlorothiazide and the Lasix and felt that the patient was retaining more fluid than he should be. For that reason he was started on furosemide 20 mg twice daily. 7/27; patient with bilateral lower extremity wounds. These are largely venous although he had very poor ABIs on screening test here. He has an appointment apparently tomorrow at vein and vascular for arterial studies. He was not too keen on the latter although I think I emphasized the need to do this both in terms of dealing with the current wounds and being prepared for further issues down the road. We have been using silver alginate on the wounds and Tubigrip's edema [name] ([id number]) [id number] Page 2 of 8 control is better per our intake nurse 02-06-2022 upon evaluation today patient appears to be doing well with regard to his wound. Fortunately there is no signs of infection he is actually healing for the most part on the bilateral feet. His legs are also doing much better though he has 1 new area that popped up on the right leg. Fortunately there is no evidence of active infection of note his ABIs and TBI's were poor on the vascular screening on the 31st with the left being worse than the right. For that reason I am going to recommend he still continue to follow-up with vascular to discuss angioplasty/stenting. [id number] upon  evaluation today patient  appears to be doing well currently in regard to his wound on the leg although he has several other blisters that are showing up at this point. I do believe that we need to try to do something to get his swelling under control this could be of utmost importance as far as getting this healed is concerned. 03-06-2022 upon evaluation today patient appears to be doing well currently in regard to his wounds. In fact the legs are drying up and seem to be healing quite nicely the lateral wounds are still weeping a little bit about the medial side actually appears to be pretty much dry and closed. The plantar aspect of his foot still is draining a bit but again it seems to be doing much better which is great news.[name] 03-13-2022 upon evaluation patient appears to be almost completely healed. His leg is doing excellent and his foot is just about closed. With that being said he did see vascular and they actually discussed the possibility of doing an arteriogram/angiogram in order to see if anything needed to be the balloons were stented open. With that being said he decided not to do that since things are healing so well right now and he is going to consider that for future they plan to see him back in November. 03-21-2022 upon evaluation today patient appears to be doing a little worse in regard to his wound on the foot. The wound keeps seeming to pull together unfortunately which is causing a trapped some fluid is just not healing quite as well as would like to see. With that being said I am going to try to remove a bit more of the overgrowth callus tissue today and hopefully that can help get this to seal up much more effectively and quickly. Fortunately I see no signs of infection of the foot although the leg on the right does seem to show some signs of erythema today I do think we need to address that currently. This looks to be cellulitis. 03-31-2022 upon evaluation today patient appears to be doing better in  regard to the cellulitis I am pleased in that regard he is still taking the antibiotics. Fortunately I do not see any evidence of active infection locally or systemically at this time which is great news. No fevers, chills, nausea, vomiting, or diarrhea. 04-10-2022 upon evaluation today patient appears to be doing well currently in regard to his foot ulcer I think were very close to complete resolution which is great news. Fortunately I do not see any signs of active infection locally or systemically at this time. No fevers, chills, nausea, vomiting, or diarrhea. 04-24-2022 upon evaluation today patient appears to be doing about the same in regard to his foot ulcer. Were not really seeing much in the way of improvements here. With that being said I do believe that he is tolerating the dressing changes without complication. No fevers, chills, nausea, vomiting, or diarrhea. With that being said the wound on his right plantar foot just does not seem to be making progress here. I do believe that he may benefit from vascular intervention which we previously discussed but he is previously wanted to hold off on. However at this point I think that this is becoming more crucial and he did ask me about getting in touch with the vascular doctor in order to see if they can get him scheduled sooner his next follow-up until like the end of December. I am more than [name]  to do this as I think it would be definitely beneficial for him. 05-01-2022 upon evaluation patient's wound bed actually showed signs of good granulation and epithelization at this point all things considered. Still his ABIs are very low is about 50% on each leg. With that being said he does have a follow-up with vascular in about 2 weeks maybe 2-1/2 weeks where he is going to have the angiogram for the right leg and then subsequently the left leg will be to follow. Nonetheless I think this is can make a big difference in getting the wounds healed to  be honest. Fortunately I do not see any signs of active infection at this point. Electronic Signature(s) Signed: 05/01/2022 10:08:42 AM By: Worthy Keeler PA-C Entered By: Worthy Keeler on 05/01/2022 10:08:42 -------------------------------------------------------------------------------- Physical Exam Details Patient Name: Date of Service: [name] [facility] [name]. 05/01/2022 8:30 A M Medical Record Number: [id number] Patient Account Number: [id number] Date of Birth/Sex: Treating RN: Jul 24, 1940 (81 y.o. [name] Primary Care Provider: Tally [name] Other Clinician: Referring Provider: Treating Provider/Extender: [name] in Treatment: 6 Constitutional Well-nourished and well-hydrated in no acute distress. Respiratory normal breathing without difficulty. Psychiatric this patient is able to make decisions and demonstrates good insight into disease process. Alert and Oriented x 3. pleasant and cooperative. [name], [name] ([id number]) [id number] Page 3 of 8 Notes Upon inspection patient's wound bed actually showed signs of good granulation and epithelization at this point. Fortunately I do not see any evidence of active infection locally or systemically which is great news and overall I am extremely pleased with where we stand today. I did not perform any debridement due to his blood flow situation but nonetheless I think that we will continue with the collagen for the plantar foot at this point. Electronic Signature(s) Signed: 05/01/2022 10:09:20 AM By: Worthy Keeler PA-C Entered By: Worthy Keeler on 05/01/2022 10:09:20 -------------------------------------------------------------------------------- Physician Orders Details Patient Name: Date of Service: [name] [facility] [name]. 05/01/2022 8:30 A M Medical Record Number: [id number] Patient Account Number: [id number] Date of Birth/Sex: Treating RN: 1941/06/04 (81 y.o. [name] Primary Care Provider: Tally [name] Other Clinician: Referring Provider: Treating Provider/Extender: [name] in Treatment: 15 Verbal / Phone Orders: No Diagnosis Coding ICD-10 Coding Code Description [id number] Chronic venous hypertension (idiopathic) with ulcer and inflammation of bilateral lower extremity L97.812 Non-pressure chronic ulcer of other part of right lower leg with fat layer exposed L97.822 Non-pressure chronic ulcer of other part of left lower leg with fat layer exposed G90.09 Other idiopathic peripheral autonomic neuropathy L97.522 Non-pressure chronic ulcer of other part of left foot with fat layer exposed L97.512 Non-pressure chronic ulcer of other part of right foot with fat layer exposed I10 Essential (primary) hypertension I50.42 Chronic combined systolic (congestive) and diastolic (congestive) heart failure I48.0 Paroxysmal atrial fibrillation I42.0 Dilated cardiomyopathy Z79.01 Long term (current) use of anticoagulants I25.10 Atherosclerotic heart disease of native coronary artery without angina pectoris N18.32 Chronic kidney disease, stage 3b Follow-up Appointments Return Appointment in 1 week. Nurse Visit as needed Bathing/ Shower/ Hygiene May shower with wound dressing protected with water repellent cover or cast protector. Edema Control - Lymphedema / Segmental Compressive Device / Other Tubigrip double layer applied - tubi E double layer Elevate, Exercise Daily and A void Standing for Long Periods of Time. Elevate legs to the level of the heart and pump ankles as often as possible Elevate leg(s) parallel to the floor  when sitting. Off-Loading Right Lower Extremity Open toe surgical shoe with peg assist. Wound Treatment Wound #2 - Foot Wound Laterality: Plantar, Right Cleanser: Wound Cleanser 3 x Per Week/30 Days Discharge Instructions: Wash your hands with soap and water. Remove old dressing, discard into plastic bag and  place into trash. Cleanse the wound with Wound Cleanser prior to applying a clean dressing using gauze sponges, not tissues or cotton balls. Do not scrub or use excessive [name], [name] ([id number]) [id number] Page 4 of 8 force. Pat dry using gauze sponges, not tissue or cotton balls. Prim Dressing: Prisma 4.34 (in) 3 x Per Week/30 Days ary Discharge Instructions: Moisten w/normal saline or sterile water; Cover wound as directed. Do not remove from wound bed. Secondary Dressing: Zetuvit Plus 4x8 (in/in) 3 x Per Week/30 Days Secured With: Tubigrip Size E, 3.5x10 (in/yds) 3 x Per Week/30 Days Discharge Instructions: Apply 3 Tubigrip E 3-finger-widths below knee to base of toes to secure dressing and/or for swelling. Wound #9 - Lower Leg Wound Laterality: Right, Lateral Cleanser: Wound Cleanser 3 x Per Week/30 Days Discharge Instructions: Wash your hands with soap and water. Remove old dressing, discard into plastic bag and place into trash. Cleanse the wound with Wound Cleanser prior to applying a clean dressing using gauze sponges, not tissues or cotton balls. Do not scrub or use excessive force. Pat dry using gauze sponges, not tissue or cotton balls. Secondary Dressing: ABD Pad 5x9 (in/in) 3 x Per Week/30 Days Discharge Instructions: Cover with ABD pad Secured With: Conform 4'' - Conforming Stretch Gauze Bandage 4x75 (in/in) 3 x Per Week/30 Days Discharge Instructions: Apply as directed Secured With: Tubigrip Size E, 3.5x10 (in/yds) 3 x Per Week/30 Days Discharge Instructions: Apply 3 Tubigrip E 3-finger-widths below knee to base of toes to secure dressing and/or for swelling. Electronic Signature(s) Signed: 05/01/2022 5:42:41 PM By: Worthy Keeler PA-C Signed: 05/05/2022 2:21:51 PM By: Rosalio Loud MSN RN CNS WTA Entered By: Rosalio Loud on 05/01/2022 08:52:35 -------------------------------------------------------------------------------- Problem List  Details Patient Name: Date of Service: [name] [facility] [name]. 05/01/2022 8:30 A M Medical Record Number: [id number] Patient Account Number: [id number] Date of Birth/Sex: Treating RN: 10/16/1940 (81 y.o. [name] Primary Care Provider: Tally [name] Other Clinician: Referring Provider: Treating Provider/Extender: [name] in Treatment: 15 Active Problems ICD-10 Encounter Code Description Active Date MDM Diagnosis I87.333 Chronic venous hypertension (idiopathic) with ulcer and inflammation of 01/16/2022 No Yes bilateral lower extremity L97.812 Non-pressure chronic ulcer of other part of right lower leg with fat layer 01/16/2022 No Yes exposed L97.822 Non-pressure chronic ulcer of other part of left lower leg with fat layer exposed 01/16/2022 No Yes G90.09 Other idiopathic peripheral autonomic neuropathy 01/16/2022 No Yes [name], [name] ([id number]) [id number] Page 5 of 8 [phone] Non-pressure chronic ulcer of other part of left foot with fat layer exposed 01/16/2022 No Yes L97.512 Non-pressure chronic ulcer of other part of right foot with fat layer exposed 01/16/2022 No Yes I10 Essential (primary) hypertension 01/16/2022 No Yes I50.42 Chronic combined systolic (congestive) and diastolic (congestive) heart failure 01/16/2022 No Yes I48.0 Paroxysmal atrial fibrillation 01/16/2022 No Yes I42.0 Dilated cardiomyopathy 01/16/2022 No Yes Z79.01 Long term (current) use of anticoagulants 01/16/2022 No Yes I25.10 Atherosclerotic heart disease of native coronary artery without angina pectoris 01/16/2022 No Yes N18.32 Chronic kidney disease, stage 3b 01/16/2022 No Yes Inactive Problems Resolved Problems Electronic Signature(s) Signed: 05/01/2022 8:38:34 AM By: Worthy Keeler PA-C Entered By: Worthy Keeler on 05/01/2022  08:38:33 -------------------------------------------------------------------------------- Progress Note Details Patient Name: Date  of Service: [name] [facility] [name]. 05/01/2022 8:30 A M Medical Record Number: [id number] Patient Account Number: [id number] Date of Birth/Sex: Treating RN: Mar 14, 1941 (81 y.o. [name] Primary Care Provider: Tally [name] Other Clinician: Referring Provider: Treating Provider/Extender: [name] in Treatment: 15 Subjective Chief Complaint Information obtained from Patient Multiple LE Ulcers [name], [name] ([id number]) [id number] Page 6 of 8 History of Present Illness (HPI) 01-16-2022 upon evaluation today patient presents for initial inspection here in the clinic concerning issues that he has been having with wounds over the bilateral lower extremities and bilateral feet. Fortunately there does not appear to be any signs of significant infection at this point which is great news. Unfortunately his ABIs were registering at 0.49 on the left and 0.57 on the right here in the clinic on the screening today. With that being said I do think that it may possibly be true that we need to get him to formal arterial studies in order to see how things really are showing up. And the patient's not in disagreement with this for that reason we are going to make that referral as well. With that being said he does have some significant past medical history items of note detailed below. Patient does have chronic kidney disease stage IIIb, coronary artery disease, long-term use of anticoagulant therapy, dilated cardiomyopathy. Proximal atrial fibrillation, he is on Xarelto long-term. He also has congestive heart failure, hypertension, peripheral neuropathy not related to diabetes as he is only prediabetic with a hemoglobin A1c of 5.8, and chronic venous hypertension. He has recently seen Dr. Tyler [name] show his cardiologist who did discontinue the hydrochlorothiazide and the Lasix and felt that the patient was retaining more fluid than he should be. For that reason  he was started on furosemide 20 mg twice daily. 7/27; patient with bilateral lower extremity wounds. These are largely venous although he had very poor ABIs on screening test here. He has an appointment apparently tomorrow at vein and vascular for arterial studies. He was not too keen on the latter although I think I emphasized the need to do this both in terms of dealing with the current wounds and being prepared for further issues down the road. We have been using silver alginate on the wounds and Tubigrip's edema control is better per our intake nurse 02-06-2022 upon evaluation today patient appears to be doing well with regard to his wound. Fortunately there is no signs of infection he is actually healing for the most part on the bilateral feet. His legs are also doing much better though he has 1 new area that popped up on the right leg. Fortunately there is no evidence of active infection of note his ABIs and TBI's were poor on the vascular screening on the 31st with the left being worse than the right. For that reason I am going to recommend he still continue to follow-up with vascular to discuss angioplasty/stenting. 8-124-23 upon evaluation today patient appears to be doing well currently in regard to his wound on the leg although he has several other blisters that are showing up at this point. I do believe that we need to try to do something to get his swelling under control this could be of utmost importance as far as getting this healed is concerned. 03-06-2022 upon evaluation today patient appears to be doing well currently in regard to his wounds. In fact the legs are drying up and  seem to be healing quite nicely the lateral wounds are still weeping a little bit about the medial side actually appears to be pretty much dry and closed. The plantar aspect of his foot still is draining a bit but again it seems to be doing much better which is great news.[name] 03-13-2022 upon evaluation patient  appears to be almost completely healed. His leg is doing excellent and his foot is just about closed. With that being said he did see vascular and they actually discussed the possibility of doing an arteriogram/angiogram in order to see if anything needed to be the balloons were stented open. With that being said he decided not to do that since things are healing so well right now and he is going to consider that for future they plan to see him back in November. 03-21-2022 upon evaluation today patient appears to be doing a little worse in regard to his wound on the foot. The wound keeps seeming to pull together unfortunately which is causing a trapped some fluid is just not healing quite as well as would like to see. With that being said I am going to try to remove a bit more of the overgrowth callus tissue today and hopefully that can help get this to seal up much more effectively and quickly. Fortunately I see no signs of infection of the foot although the leg on the right does seem to show some signs of erythema today I do think we need to address that currently. This looks to be cellulitis. 03-31-2022 upon evaluation today patient appears to be doing better in regard to the cellulitis I am pleased in that regard he is still taking the antibiotics. Fortunately I do not see any evidence of active infection locally or systemically at this time which is great news. No fevers, chills, nausea, vomiting, or diarrhea. 04-10-2022 upon evaluation today patient appears to be doing well currently in regard to his foot ulcer I think were very close to complete resolution which is great news. Fortunately I do not see any signs of active infection locally or systemically at this time. No fevers, chills, nausea, vomiting, or diarrhea. 04-24-2022 upon evaluation today patient appears to be doing about the same in regard to his foot ulcer. Were not really seeing much in the way of improvements here. With that being  said I do believe that he is tolerating the dressing changes without complication. No fevers, chills, nausea, vomiting, or diarrhea. With that being said the wound on his right plantar foot just does not seem to be making progress here. I do believe that he may benefit from vascular intervention which we previously discussed but he is previously wanted to hold off on. However at this point I think that this is becoming more crucial and he did ask me about getting in touch with the vascular doctor in order to see if they can get him scheduled sooner his next follow-up until like the end of December. I am more than [name] to do this as I think it would be definitely beneficial for him. 05-01-2022 upon evaluation patient's wound bed actually showed signs of good granulation and epithelization at this point all things considered. Still his ABIs are very low is about 50% on each leg. With that being said he does have a follow-up with vascular in about 2 weeks maybe 2-1/2 weeks where he is going to have the angiogram for the right leg and then subsequently the left leg will be to follow.  Nonetheless I think this is can make a big difference in getting the wounds healed to be honest. Fortunately I do not see any signs of active infection at this point. Objective Constitutional Well-nourished and well-hydrated in no acute distress. Vitals Time Taken: 8:31 AM, Height: 74 in, Weight: 244 lbs, BMI: 31.3, Temperature: 97.9 F, Pulse: 69 bpm, Respiratory Rate: 16 breaths/min, Blood Pressure: 134/72 mmHg. Respiratory normal breathing without difficulty. Psychiatric this patient is able to make decisions and demonstrates good insight into disease process. Alert and Oriented x 3. pleasant and cooperative. [name], [name] ([id number]) [id number] Page 7 of 8 General Notes: Upon inspection patient's wound bed actually showed signs of good granulation and epithelization at this point.  Fortunately I do not see any evidence of active infection locally or systemically which is great news and overall I am extremely pleased with where we stand today. I did not perform any debridement due to his blood flow situation but nonetheless I think that we will continue with the collagen for the plantar foot at this point. Integumentary (Hair, Skin) Wound #2 status is Open. Original cause of wound was Gradually Appeared. The date acquired was: 12/28/2020. The wound has been in treatment 15 weeks. The wound is located on the [place]. The wound measures 0.7cm length x 0.2cm width x 0.2cm depth; 0.11cm^2 area and 0.022cm^3 volume. There is Fat Layer (Subcutaneous Tissue) exposed. There is a medium amount of serosanguineous drainage noted. There is small (1-33%) red granulation within the wound bed. There is no necrotic tissue within the wound bed. Wound #9 status is Open. Original cause of wound was Skin T ear/Laceration. The date acquired was: 04/09/2022. The wound has been in treatment 3 weeks. The wound is located on the Right,Lateral Lower Leg. The wound measures 1.5cm length x 2.5cm width x 0.1cm depth; 2.945cm^2 area and 0.295cm^3 volume. There is Fat Layer (Subcutaneous Tissue) exposed. There is a medium amount of serosanguineous drainage noted. The wound margin is flat and intact. There is no granulation within the wound bed. There is no necrotic tissue within the wound bed. 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 Nurse Visit as needed Bathing/ Shower/ Hygiene: May shower with wound dressing protected with water repellent cover or cast protector. Edema Control - Lymphedema / Segmental Compressive Device / Other: Tubigrip double layer applied - tubi E double layer Elevate, Exercise Daily and Avoid Standing for Long Periods of Time. Elevate legs to the level of the heart and pump ankles as often as possible Elevate leg(s) parallel to the floor when sitting. Off-Loading: Open toe surgical shoe with peg assist. WOUND #2: - Foot Wound Laterality: Plantar, Right Cleanser: Wound Cleanser 3 x Per Week/30 Days Discharge Instructions: Wash your hands with soap and water. Remove old dressing, discard into plastic bag and place into trash. Cleanse the  wound with Wound Cleanser prior to applying a clean dressing using gauze sponges, not tissues or cotton balls. Do not scrub or use excessive force. Pat dry using gauze sponges, not tissue or cotton balls. Prim Dressing: Prisma 4.34 (in) 3 x Per Week/30 Days ary Discharge Instructions: Moisten w/normal saline or sterile water; Cover wound as directed. Do not remove from wound bed. Secondary Dressing: Zetuvit Plus 4x8 (in/in) 3 x Per Week/30 Days Secured With: Tubigrip Size E, 3.5x10 (in/yds) 3 x Per Week/30 Days Discharge Instructions: Apply 3 Tubigrip E 3-finger-widths below knee to base of toes to secure dressing and/or for swelling. WOUND #9: - Lower Leg Wound Laterality: Right, Lateral Cleanser: Wound Cleanser 3 x Per Week/30 Days Discharge Instructions: Wash your hands with soap and water.  Remove old dressing, discard into plastic bag and place into trash. Cleanse the wound with Wound Cleanser prior to applying a clean dressing using gauze sponges, not tissues or cotton balls. Do not scrub or use excessive force. Pat dry using gauze sponges, not tissue or cotton balls. Secondary Dressing: ABD Pad 5x9 (in/in) 3 x Per Week/30 Days Discharge Instructions: Cover with ABD pad Secured With: Conform 4'' - Conforming Stretch Gauze Bandage 4x75 (in/in) 3 x Per Week/30 Days Discharge Instructions: Apply as directed Secured With: Tubigrip Size E, 3.5x10 (in/yds) 3 x Per Week/30 Days Discharge Instructions: Apply 3 Tubigrip E 3-finger-widths below knee to base of toes to secure dressing and/or for swelling. 1. I am going to suggest that we have the patient continue to monitor for any signs of worsening or infection. Obviously based on what we are seeing I do believe that if he has improved blood flow he will actually have a much better chance of getting this to heal I am very pleased with the fact that he is got this scheduled its 21 November when he is going for the right angiogram. 2. I am also can recommend that we have the patient continue to utilize the Tubigrip size E which I think is helping with some of the edema although it would be much better to have him in a compression wrap as soon as we get good blood flow I think we can proceed as such. 3. We will also get a continue with the peg assist offloading surgical shoe to try to help with pressure relief on the plantar foot. We will see patient back for reevaluation in 1 week here in the clinic. If anything worsens or changes patient will contact our office for additional recommendations. [name], [name] ([id number]) [id number] Page 8 of 8 Electronic Signature(s) Signed: 05/01/2022 10:10:58 AM By: Worthy Keeler PA-C Entered By: Worthy Keeler on 05/01/2022  10:10:57 -------------------------------------------------------------------------------- SuperBill Details Patient Name: Date of Service: [name] [name]. 05/01/2022 Medical Record Number: [id number] Patient Account Number: [id number] Date of Birth/Sex: Treating RN: Oct 19, 1940 (81 y.o. [name] Primary Care Provider: Tally [name] Other Clinician: Referring Provider: Treating Provider/Extender: [name] in Treatment: 15 Diagnosis Coding ICD-10 Codes Code Description [phone] Chronic venous hypertension (idiopathic) with ulcer and inflammation of bilateral lower extremity L97.812 Non-pressure chronic ulcer of other part of right lower leg with fat layer exposed L97.822 Non-pressure chronic ulcer of other part of left lower leg with fat layer exposed G90.09 Other idiopathic peripheral autonomic neuropathy L97.522 Non-pressure chronic ulcer of other part of left foot with fat layer exposed L97.512 Non-pressure chronic ulcer of other part of right foot with fat layer exposed I10 Essential (primary)  hypertension I50.42 Chronic combined systolic (congestive) and diastolic (congestive) heart failure I48.0 Paroxysmal atrial fibrillation I42.0 Dilated cardiomyopathy Z79.01 Long term (current) use of anticoagulants I25.10 Atherosclerotic heart disease of native coronary artery without angina pectoris N18.32 Chronic kidney disease, stage 3b Facility Procedures : CPT4 Code: [id number] Description: 99213 - WOUND CARE VISIT-LEV 3 EST PT Modifier: Quantity: 1 Physician Procedures : CPT4 Code Description Modifier [id number] - WC PHYS LEVEL 3 - EST PT ICD-10 Diagnosis Description I87.333 Chronic venous hypertension (idiopathic) with ulcer and inflammation of bilateral lower extremity L97.812 Non-pressure chronic ulcer of other  part of right lower leg with fat layer exposed L97.822 Non-pressure chronic ulcer of other part of left lower leg with fat layer exposed  G90.09 Other idiopathic peripheral autonomic neuropathy Quantity: 1 Electronic Signature(s) Signed: 05/01/2022 10:11:13 AM By: Worthy Keeler PA-C Entered By: Worthy Keeler on 05/01/2022 10:11:12

## 2022-05-01 NOTE — Progress Notes (Addendum)
SAMAD, THON ([id number]) [id number] Page 1 of 10 Visit Report for 05/01/2022 Arrival Information Details Patient Name: Date of Service: [name] [facility] [name]. 05/01/2022 8:30 A M Medical Record Number: [id number] Patient Account Number: [id number] Date of Birth/Sex: Treating RN: 13-Feb-1941 (81 y.o. [name] Primary Care Appollonia Klee: [name] Other Clinician: Referring Laquasia Pincus: Treating Satoria Dunlop/Extender: [name] in Treatment: 15 Visit Information History Since Last Visit Added or deleted any medications: No Patient Arrived: [name] Any new allergies or adverse reactions: No Arrival Time: 08:27 Had a fall or experienced change in No Accompanied By: self activities of daily living that may affect Transfer Assistance: None risk of falls: Patient Requires Transmission-Based Precautions: No Hospitalized since last visit: No Patient Has Alerts: No Pain Present Now: No Electronic Signature(s) Signed: 05/01/2022 8:45:34 AM By: Rosalio Loud MSN RN CNS WTA Entered By: Rosalio Loud on 05/01/2022 08:31:22 -------------------------------------------------------------------------------- Clinic Level of Care Assessment Details Patient Name: Date of Service: [name] [facility] [name]. 05/01/2022 8:30 A M Medical Record Number: [id number] Patient Account Number: [id number] Date of Birth/Sex: Treating RN: 1941/02/06 (82 y.o. [name] Primary Care Addilyne Backs: [name] Other Clinician: Referring Lewin Pellow: Treating Kissa Campoy/Extender: [name] in Treatment: 15 Clinic Level of Care Assessment Items TOOL 4 Quantity Score X- 1 0 Use when only an EandM is performed on FOLLOW-UP visit ASSESSMENTS - Nursing Assessment / Reassessment X- 1 10 Reassessment of Co-morbidities (includes updates in patient status) X- 1 5 Reassessment of Adherence to Treatment Plan ASSESSMENTS - Wound and Skin A ssessment / Reassessment []   - 0 Simple Wound Assessment / Reassessment - one wound X- 2 5 Complex Wound Assessment / Reassessment - multiple wounds [name] ([id number]) [id number] Page 2 of 10 []  - 0 Dermatologic / Skin Assessment (not related to wound area) ASSESSMENTS - Focused Assessment []  - 0 Circumferential Edema Measurements - multi extremities []  - 0 Nutritional Assessment / Counseling / Intervention []  - 0 Lower Extremity Assessment (monofilament, tuning fork, pulses) []  - 0 Peripheral Arterial Disease Assessment (using hand held doppler) ASSESSMENTS - Ostomy and/or Continence Assessment and Care []  - 0 Incontinence Assessment and Management []  - 0 Ostomy Care Assessment and Management (repouching, etc.) PROCESS - Coordination of Care []  - 0 Simple Patient / Family Education for ongoing care []  - 0 Complex (extensive) Patient / Family Education for ongoing care X- 1 10 Staff obtains Programmer, systems, Records, T Results / Process Orders est []  - 0 Staff telephones HHA, Nursing Homes / Clarify orders / etc []  - 0 Routine Transfer to another Facility (non-emergent condition) []  - 0 Routine Hospital Admission (non-emergent condition) []  - 0 New Admissions / Biomedical engineer / Ordering NPWT Apligraf, etc. , []  - 0 Emergency Hospital Admission (emergent condition) []  - 0 Simple Discharge Coordination X- 1 15 Complex (extensive) Discharge Coordination PROCESS - Special Needs []  - 0 Pediatric / Minor Patient Management []  - 0 Isolation Patient Management []  - 0 Hearing / Language / Visual special needs []  - 0 Assessment of Community assistance (transportation, [name]/C planning, etc.) []  - 0 Additional assistance / Altered mentation []  - 0 Support Surface(s) Assessment (bed, cushion, seat, etc.) INTERVENTIONS - Wound Cleansing / Measurement []  - 0 Simple Wound Cleansing - one wound X- 2 5 Complex Wound Cleansing - multiple wounds X- 1 5 Wound Imaging  (photographs - any number of wounds) []  - 0 Wound Tracing (instead of photographs) []  - 0 Simple Wound Measurement - one  wound X- 2 5 Complex Wound Measurement - multiple wounds INTERVENTIONS - Wound Dressings X - Small Wound Dressing one or multiple wounds 2 10 []  - 0 Medium Wound Dressing one or multiple wounds []  - 0 Large Wound Dressing one or multiple wounds []  - 0 Application of Medications - topical []  - 0 Application of Medications - injection INTERVENTIONS - Miscellaneous []  - 0 External ear exam []  - 0 Specimen Collection (cultures, biopsies, blood, body fluids, etc.) []  - 0 Specimen(s) / Culture(s) sent or taken to Lab for analysis []  - 0 Patient Transfer (multiple staff / Stormy Fabian / Similar devices) [name] ([id number]) [id number] Page 3 of 10 []  - 0 Simple Staple / Suture removal (25 or less) []  - 0 Complex Staple / Suture removal (26 or more) []  - 0 Hypo / Hyperglycemic Management (close monitor of Blood Glucose) []  - 0 Ankle / Brachial Index (ABI) - do not check if billed separately X- 1 5 Vital Signs Has the patient been seen at the hospital within the last three years: Yes Total Score: 100 Level Of Care: New/Established - Level 3 Electronic Signature(s) Signed: 05/05/2022 2:21:51 PM By: Rosalio Loud MSN RN CNS WTA Entered By: Rosalio Loud on 05/01/2022 08:53:58 -------------------------------------------------------------------------------- Complex / Palliative Patient Assessment Details Patient Name: Date of Service: [name] [facility] [name]. 05/01/2022 8:30 A M Medical Record Number: [id number] Patient Account Number: [id number] Date of Birth/Sex: Treating RN: 08-06-1940 (81 y.o. [name] Primary Care Nou Chard: [name] Other Clinician: Referring Gale Klar: Treating Karine Garn/Extender: [name] in Treatment: 15 Complex Wound Management Criteria Patient has remarkable or complex  co-morbidities requiring medications or treatments that extend wound healing times. Examples: Diabetes mellitus with chronic renal failure or end stage renal disease requiring dialysis Advanced or poorly controlled rheumatoid arthritis Diabetes mellitus and end stage chronic obstructive pulmonary disease Active cancer with current chemo- or radiation therapy CKD stage 3 Palliative Wound Management Criteria Care Approach Wound Care Plan: Complex Wound Management Electronic Signature(s) Signed: 05/05/2022 12:36:20 PM By: Gretta Cool, BSN, RN, CWS, Kim RN, BSN Signed: 05/29/2022 5:46:53 PM By: Worthy Keeler PA-C Entered By: Gretta Cool, BSN, RN, CWS, Kim on 05/05/2022 12:36:20 -------------------------------------------------------------------------------- Encounter Discharge Information Details Patient Name: Date of Service: [name] [facility] [name]. 05/01/2022 8:30 A Georgia Duff, Deidre Ala [name] ([id number]) [id number] Page 4 of 10 Medical Record Number: [id number] Patient Account Number: [id number] Date of Birth/Sex: Treating RN: Jan 17, 1941 (81 y.o. [name] Primary Care Nandita Mathenia: [name] Other Clinician: Referring Mikel Hardgrove: Treating Olie Scaffidi/Extender: [name] in Treatment: 15 Encounter Discharge Information Items Discharge Condition: Stable Ambulatory Status: Ambulatory Discharge Destination: Home Transportation: Private Auto Accompanied By: self Schedule Follow-up Appointment: Yes Clinical Summary of Care: Electronic Signature(s) Signed: 05/05/2022 2:21:51 PM By: Rosalio Loud MSN RN CNS WTA Entered By: Rosalio Loud on 05/01/2022 08:55:03 -------------------------------------------------------------------------------- Lower Extremity Assessment Details Patient Name: Date of Service: [name] [facility] [name]. 05/01/2022 8:30 A M Medical Record Number: [id number] Patient Account Number: [id number] Date of Birth/Sex: Treating RN: September 03, 1940 (81 y.o. [name] Primary Care Kelii Chittum: [name] Other Clinician: Referring Encarnacion Bole: Treating Trevaughn Schear/Extender: [name] in Treatment: 15 Edema Assessment Assessed: [name]: No] [name]: No] [Left: Edema] [Right: :] Calf Left: Right: Point of Measurement: 38 cm From Medial Instep 39.3 cm Ankle Left: Right: Point of Measurement: 15 cm From Medial Instep 28.9 cm Vascular Assessment Pulses: Dorsalis Pedis Palpable: [Right:Yes] Electronic Signature(s) Signed: 05/05/2022 2:21:51 PM By:  Rosalio Loud MSN RN CNS WTA Previous Signature: 05/01/2022 8:45:34 AM Version By: Rosalio Loud MSN RN CNS WTA Entered By: Rosalio Loud on 05/01/2022 08:51:43 Knisley, Deidre Ala [name] ([id number]) [id number] Page 5 of 10 -------------------------------------------------------------------------------- Multi Wound Chart Details Patient Name: Date of Service: [name] [facility] [name]. 05/01/2022 8:30 A M Medical Record Number: [id number] Patient Account Number: [id number] Date of Birth/Sex: Treating RN: 01-26-1941 (81 y.o. [name] Primary Care Delcia Spitzley: [name] Other Clinician: Referring Marylon Verno: Treating Jamariah Tony/Extender: [name] in Treatment: 15 Vital Signs Height(in): 74 Pulse(bpm): 6 Weight(lbs): 244 Blood Pressure(mmHg): 134/72 Body Mass Index(BMI): 31.3 Temperature(F): 97.9 Respiratory Rate(breaths/min): 16 [2:Photos:] [N/A:N/A] Right, Plantar Foot Right, Lateral Lower Leg N/A Wound Location: Gradually Appeared Skin T ear/Laceration N/A Wounding Event: Neuropathic Ulcer-Non Diabetic Skin T ear N/A Primary Etiology: Congestive Heart Failure, Congestive Heart Failure, N/A Comorbid History: Hypertension Hypertension 12/28/2020 04/09/2022 N/A Date Acquired: 15 3 N/A Weeks of Treatment: Open Open N/A Wound Status: No No N/A Wound Recurrence: 0.7x0.2x0.2 1.5x2.5x0.1 N/A Measurements L x W x [name] (cm) 0.11 2.945  N/A A (cm) : rea 0.022 0.295 N/A Volume (cm) : -39.20% -476.30% N/A % Reduction in Area: 8.30% -478.40% N/A % Reduction in Volume: Full Thickness Without Exposed Partial Thickness N/A Classification: Support Structures Medium Medium N/A Exudate Amount: Serosanguineous Serosanguineous N/A Exudate Type: red, brown red, brown N/A Exudate Color: N/A Flat and Intact N/A Wound Margin: Small (1-33%) None Present (0%) N/A Granulation Amount: Red N/A N/A Granulation Quality: None Present (0%) None Present (0%) N/A Necrotic Amount: Fat Layer (Subcutaneous Tissue): Yes Fat Layer (Subcutaneous Tissue): Yes N/A Exposed Structures: Fascia: No Fascia: No Tendon: No Tendon: No Muscle: No Muscle: No Joint: No Joint: No Bone: No Bone: No Medium (34-66%) None N/A Epithelialization: Treatment Notes Electronic Signature(s) Signed: 05/05/2022 2:21:51 PM By: Rosalio Loud MSN RN CNS WTA Entered By: Rosalio Loud on 05/01/2022 08:51:59 Lorensen, Amaree [name] ([id number]) [id number] Page 6 of 10 -------------------------------------------------------------------------------- Multi-Disciplinary Care Plan Details Patient Name: Date of Service: [name] [facility] [name]. 05/01/2022 8:30 A M Medical Record Number: [id number] Patient Account Number: [id number] Date of Birth/Sex: Treating RN: 06/16/1941 (81 y.o. [name] Primary Care Ruhee Enck: [name] Other Clinician: Referring Skyah Hannon: Treating Shelia Kingsberry/Extender: [name] in Treatment: 15 Active Inactive Necrotic Tissue Nursing Diagnoses: Impaired tissue integrity related to necrotic/devitalized tissue Knowledge deficit related to management of necrotic/devitalized tissue Goals: Necrotic/devitalized tissue will be minimized in the wound bed Date Initiated: 03/31/2022 Target Resolution Date: 03/31/2022 Goal Status: Active Patient/caregiver will verbalize understanding of reason and  process for debridement of necrotic tissue Date Initiated: 03/31/2022 Target Resolution Date: 03/31/2022 Goal Status: Active Interventions: Assess patient pain level pre-, during and post procedure and prior to discharge Provide education on necrotic tissue and debridement process Treatment Activities: Excisional debridement : 03/31/2022 Notes: Wound/Skin Impairment Nursing Diagnoses: Knowledge deficit related to ulceration/compromised skin integrity Goals: Patient/caregiver will verbalize understanding of skin care regimen Date Initiated: 01/16/2022 Target Resolution Date: 02/16/2022 Goal Status: Active Ulcer/skin breakdown will have a volume reduction of 30% by week 4 Date Initiated: 01/16/2022 Target Resolution Date: 02/16/2022 Goal Status: Active Ulcer/skin breakdown will have a volume reduction of 50% by week 8 Date Initiated: 01/16/2022 Target Resolution Date: 03/19/2022 Goal Status: Active Ulcer/skin breakdown will have a volume reduction of 80% by week 12 Date Initiated: 01/16/2022 Target Resolution Date: 04/18/2022 Goal Status: Active Ulcer/skin breakdown will heal within 14 weeks Date Initiated: 01/16/2022 Target Resolution Date: 05/19/2022 Goal Status: Active  Interventions: Assess patient/caregiver ability to obtain necessary supplies Assess patient/caregiver ability to perform ulcer/skin care regimen upon admission and as needed Assess ulceration(s) every visit Notes: DAVIUS, GOUDEAU ([id number]) [id number] Page 7 of 10 Electronic Signature(s) Signed: 05/05/2022 2:21:51 PM By: Rosalio Loud MSN RN CNS WTA Previous Signature: 05/01/2022 8:45:34 AM Version By: Rosalio Loud MSN RN CNS WTA Entered By: Rosalio Loud on 05/01/2022 08:51:48 -------------------------------------------------------------------------------- Pain Assessment Details Patient Name: Date of Service: [name] [facility] [name]. 05/01/2022 8:30 A M Medical Record Number:  [id number] Patient Account Number: [id number] Date of Birth/Sex: Treating RN: [birth year] (81 y.o. [name] Primary Care Yannely Kintzel: [name] Other Clinician: Referring Daxon Kyne: Treating Lekesha Claw/Extender: [name] in Treatment: 15 Active Problems Location of Pain Severity and Description of Pain Patient Has Paino No Site Locations Pain Management and Medication Current Pain Management: Electronic Signature(s) Signed: 05/01/2022 8:45:34 AM By: Rosalio Loud MSN RN CNS WTA Entered By: Rosalio Loud on 05/01/2022 08:34:03 Ulatowski, Jie [name] ([id number]) [id number] Page 8 of 10 -------------------------------------------------------------------------------- Patient/Caregiver Education Details Patient Name: Date of Service: [name] [facility] [name]. 11/2/2023andnbsp8:30 A M Medical Record Number: [id number] Patient Account Number: [id number] Date of Birth/Gender: Treating RN: [birth year] (81 y.o. [name] Primary Care Physician: [name] Other Clinician: Referring Physician: Treating Physician/Extender: [name] in Treatment: 15 Education Assessment Education Provided To: Patient Education Topics Provided Wound/Skin Impairment: Handouts: Caring for Your Ulcer Methods: Explain/Verbal Responses: State content correctly Electronic Signature(s) Signed: 05/05/2022 2:21:51 PM By: Rosalio Loud MSN RN CNS WTA Entered By: Rosalio Loud on 05/01/2022 08:54:21 -------------------------------------------------------------------------------- Wound Assessment Details Patient Name: Date of Service: [name] [facility] [name]. 05/01/2022 8:30 A M Medical Record Number: [id number] Patient Account Number: [id number] Date of Birth/Sex: Treating RN: 03/14/1941 (81 y.o. [name] Primary Care Nataliyah Packham: [name] Other Clinician: Referring Karle Desrosier: Treating Kandra Graven/Extender: [name] in  Treatment: 15 Wound Status Wound Number: 2 Primary Etiology: Neuropathic Ulcer-Non Diabetic Wound Location: Right, Plantar Foot Wound Status: Open Wounding Event: Gradually Appeared Comorbid History: Congestive Heart Failure, Hypertension Date Acquired: 12/28/2020 Weeks Of Treatment: 15 Clustered Wound: No Photos Wound Measurements Length: (cm) 0.7 Ostrander, Zebulan [name] ([id number]) Width: (cm) 0.2 Depth: (cm) 0.2 Area: (cm) 0.11 Volume: (cm) 0.022 % Reduction in Area: -39.2% [id number] Page 9 of 10 % Reduction in Volume: 8.3% Epithelialization: Medium (34-66%) Wound Description Classification: Full Thickness Without Exposed Support Exudate Amount: Medium Exudate Type: Serosanguineous Exudate Color: red, brown Structures Foul Odor After Cleansing: No Slough/Fibrino No Wound Bed Granulation Amount: Small (1-33%) Exposed Structure Granulation Quality: Red Fascia Exposed: No Necrotic Amount: None Present (0%) Fat Layer (Subcutaneous Tissue) Exposed: Yes Tendon Exposed: No Muscle Exposed: No Joint Exposed: No Bone Exposed: No Electronic Signature(s) Signed: 05/01/2022 8:45:34 AM By: Rosalio Loud MSN RN CNS WTA Entered By: Rosalio Loud on 05/01/2022 08:42:11 -------------------------------------------------------------------------------- Wound Assessment Details Patient Name: Date of Service: [name] [facility] [name]. 05/01/2022 8:30 A M Medical Record Number: [id number] Patient Account Number: [id number] Date of Birth/Sex: Treating RN: 04/06/1941 (81 y.o. [name] Primary Care Etheline Geppert: [name] Other Clinician: Referring Morningstar Toft: Treating Mitra Duling/Extender: [name] in Treatment: 15 Wound Status Wound Number: 9 Primary Etiology: Skin Tear Wound Location: Right, Lateral Lower Leg Wound Status: Open Wounding Event: Skin Tear/Laceration Comorbid History: Congestive Heart Failure, Hypertension Date Acquired:  04/09/2022 Weeks Of Treatment: 3 Clustered Wound: No Photos Wound Measurements Length: (cm) 1.5 Width: (cm) 2.5 Depth: (cm) 0.1 Wiegand,  Kalei [name] ([id number]) Area: (cm) 2.945 Volume: (cm) 0.295 % Reduction in Area: -476.3% % Reduction in Volume: -478.4% Epithelialization: None [id number] Page 10 of 10 Wound Description Classification: Partial Thickness Wound Margin: Flat and Intact Exudate Amount: Medium Exudate Type: Serosanguineous Exudate Color: red, brown Foul Odor After Cleansing: No Slough/Fibrino No Wound Bed Granulation Amount: None Present (0%) Exposed Structure Necrotic Amount: None Present (0%) Fascia Exposed: No Fat Layer (Subcutaneous Tissue) Exposed: Yes Tendon Exposed: No Muscle Exposed: No Joint Exposed: No Bone Exposed: No Electronic Signature(s) Signed: 05/01/2022 8:45:34 AM By: Rosalio Loud MSN RN CNS WTA Entered By: Rosalio Loud on 05/01/2022 08:42:37 -------------------------------------------------------------------------------- Vitals Details Patient Name: Date of Service: [name], RA LPH [name]. 05/01/2022 8:30 A M Medical Record Number: [id number] Patient Account Number: [id number] Date of Birth/Sex: Treating RN: 1940/11/06 (81 y.o. [name] Primary Care Jalina Blowers: [name] Other Clinician: Referring Frankye Schwegel: Treating Luana Tatro/Extender: [name] in Treatment: 15 Vital Signs Time Taken: 08:31 Temperature (F): 97.9 Height (in): 74 Pulse (bpm): 69 Weight (lbs): 244 Respiratory Rate (breaths/min): 16 Body Mass Index (BMI): 31.3 Blood Pressure (mmHg): 134/72 Reference Range: 80 - 120 mg / dl Electronic Signature(s) Signed: 05/01/2022 8:45:34 AM By: Rosalio Loud MSN RN CNS WTA Entered By: Rosalio Loud on 05/01/2022 08:33:56

## 2022-05-05 ENCOUNTER — Ambulatory Visit (INDEPENDENT_AMBULATORY_CARE_PROVIDER_SITE_OTHER): Payer: PPO | Admitting: Vascular Surgery

## 2022-05-05 ENCOUNTER — Other Ambulatory Visit (INDEPENDENT_AMBULATORY_CARE_PROVIDER_SITE_OTHER): Payer: PPO

## 2022-05-05 ENCOUNTER — Encounter (INDEPENDENT_AMBULATORY_CARE_PROVIDER_SITE_OTHER): Payer: PPO

## 2022-05-08 ENCOUNTER — Encounter: Payer: PPO | Admitting: Internal Medicine

## 2022-05-08 DIAGNOSIS — S81811A Laceration without foreign body, right lower leg, initial encounter: Secondary | ICD-10-CM | POA: Diagnosis not present

## 2022-05-08 DIAGNOSIS — S81801A Unspecified open wound, right lower leg, initial encounter: Secondary | ICD-10-CM | POA: Diagnosis not present

## 2022-05-08 DIAGNOSIS — S91301A Unspecified open wound, right foot, initial encounter: Secondary | ICD-10-CM | POA: Diagnosis not present

## 2022-05-08 DIAGNOSIS — I87333 Chronic venous hypertension (idiopathic) with ulcer and inflammation of bilateral lower extremity: Secondary | ICD-10-CM | POA: Diagnosis not present

## 2022-05-08 NOTE — Progress Notes (Signed)
LAMARCUS, SPIRA ([id number]) [id number] Page 1 of 13 Visit Report for 05/08/2022 Arrival Information Details Patient Name: Date of Service: [name] [facility] D. 05/08/2022 8:30 A M Medical Record Number: [id number] Patient Account Number: [id number] Date of Birth/Sex: Treating RN: 1940/10/10 (81 y.o. [name] Primary Care Kristjan Derner: [name] Other Clinician: Referring Elba Schaber: Treating Lan Mcneill/Extender: [name], MICHA EL Odessa Fleming in Treatment: 16 Visit Information History Since Last Visit Added or deleted any medications: No Patient Arrived: [name] Any new allergies or adverse reactions: No Arrival Time: 08:23 Had a fall or experienced change in No Accompanied By: self activities of daily living that may affect Transfer Assistance: None risk of falls: Patient Identification Verified: Yes Hospitalized since last visit: No Secondary Verification Process Completed: Yes Pain Present Now: No Patient Requires Transmission-Based Precautions: No Patient Has Alerts: Yes Patient Alerts: ABI R 0.69 L 0.66 TBI R 0.41 L 0.16 Electronic Signature(s) Signed: 05/08/2022 1:37:25 PM By: Rosalio Loud MSN RN CNS WTA Entered By: Rosalio Loud on 05/08/2022 13:37:25 -------------------------------------------------------------------------------- Clinic Level of Care Assessment Details Patient Name: Date of Service: [name] [facility] D. 05/08/2022 8:30 A M Medical Record Number: [id number] Patient Account Number: [id number] Date of Birth/Sex: Treating RN: August 25, 1940 (81 y.o. [name] Primary Care Rolanda Campa: [name] Other Clinician: Referring Wilbert Schouten: Treating Caila Cirelli/Extender: [name], MICHA EL Odessa Fleming in Treatment: 16 Clinic Level of Care Assessment Items TOOL 4 Quantity Score X- 1 0 Use when only an EandM is performed on FOLLOW-UP visit ASSESSMENTS - Nursing Assessment / Reassessment X- 1 10 Reassessment of  Co-morbidities (includes updates in patient status) X- 1 5 Reassessment of Adherence to Treatment Plan ASSESSMENTS - Wound and Skin A ssessment / Reassessment []  - 0 Simple Wound Assessment / Reassessment - one wound Yakubov, Khalik D ([id number]) [id number] Page 2 of 13 X- 3 5 Complex Wound Assessment / Reassessment - multiple wounds []  - 0 Dermatologic / Skin Assessment (not related to wound area) ASSESSMENTS - Focused Assessment []  - 0 Circumferential Edema Measurements - multi extremities []  - 0 Nutritional Assessment / Counseling / Intervention []  - 0 Lower Extremity Assessment (monofilament, tuning fork, pulses) []  - 0 Peripheral Arterial Disease Assessment (using hand held doppler) ASSESSMENTS - Ostomy and/or Continence Assessment and Care []  - 0 Incontinence Assessment and Management []  - 0 Ostomy Care Assessment and Management (repouching, etc.) PROCESS - Coordination of Care X - Simple Patient / Family Education for ongoing care 1 15 []  - 0 Complex (extensive) Patient / Family Education for ongoing care X- 1 10 Staff obtains Programmer, systems, Records, T Results / Process Orders est []  - 0 Staff telephones HHA, Nursing Homes / Clarify orders / etc []  - 0 Routine Transfer to another Facility (non-emergent condition) []  - 0 Routine Hospital Admission (non-emergent condition) []  - 0 New Admissions / Biomedical engineer / Ordering NPWT Apligraf, etc. , []  - 0 Emergency Hospital Admission (emergent condition) []  - 0 Simple Discharge Coordination X- 1 15 Complex (extensive) Discharge Coordination PROCESS - Special Needs []  - 0 Pediatric / Minor Patient Management []  - 0 Isolation Patient Management []  - 0 Hearing / Language / Visual special needs []  - 0 Assessment of Community assistance (transportation, D/C planning, etc.) []  - 0 Additional assistance / Altered mentation []  - 0 Support Surface(s) Assessment (bed, cushion, seat,  etc.) INTERVENTIONS - Wound Cleansing / Measurement []  - 0 Simple Wound Cleansing - one wound X- 3 5 Complex Wound Cleansing -  multiple wounds X- 1 5 Wound Imaging (photographs - any number of wounds) []  - 0 Wound Tracing (instead of photographs) []  - 0 Simple Wound Measurement - one wound X- 3 5 Complex Wound Measurement - multiple wounds INTERVENTIONS - Wound Dressings []  - 0 Small Wound Dressing one or multiple wounds X- 3 15 Medium Wound Dressing one or multiple wounds []  - 0 Large Wound Dressing one or multiple wounds []  - 0 Application of Medications - topical []  - 0 Application of Medications - injection INTERVENTIONS - Miscellaneous []  - 0 External ear exam []  - 0 Specimen Collection (cultures, biopsies, blood, body fluids, etc.) []  - 0 Specimen(s) / Culture(s) sent or taken to Lab for analysis IFEANYICHUKWU, WICKHAM D ([id number]) [id number] Page 3 of 13 []  - 0 Patient Transfer (multiple staff / Civil Service fast streamer / Similar devices) []  - 0 Simple Staple / Suture removal (25 or less) []  - 0 Complex Staple / Suture removal (26 or more) []  - 0 Hypo / Hyperglycemic Management (close monitor of Blood Glucose) []  - 0 Ankle / Brachial Index (ABI) - do not check if billed separately X- 1 5 Vital Signs Has the patient been seen at the hospital within the last three years: Yes Total Score: 155 Level Of Care: New/Established - Level 4 Electronic Signature(s) Signed: 05/08/2022 4:19:44 PM By: Rosalio Loud MSN RN CNS WTA Entered By: Rosalio Loud on 05/08/2022 13:41:38 -------------------------------------------------------------------------------- Encounter Discharge Information Details Patient Name: Date of Service: [name], RA LPH D. 05/08/2022 8:30 A M Medical Record Number: [id number] Patient Account Number: [id number] Date of Birth/Sex: Treating RN: [birth year] (81 y.o. [name] Primary Care [place] Souter: [name] Other Clinician: Referring  Lovely Kerins: Treating Ariahna Smiddy/Extender: RO BSO Delane Ginger, MICHA EL Odessa Fleming in Treatment: 16 Encounter Discharge Information Items Discharge Condition: Stable Ambulatory Status: Walker Discharge Destination: Home Transportation: Private Auto Accompanied By: self Schedule Follow-up Appointment: Yes Clinical Summary of Care: Electronic Signature(s) Signed: 05/08/2022 1:42:38 PM By: Rosalio Loud MSN RN CNS WTA Entered By: Rosalio Loud on 05/08/2022 13:42:38 -------------------------------------------------------------------------------- Lower Extremity Assessment Details Patient Name: Date of Service: [name] [facility] D. 05/08/2022 8:30 A M Medical Record Number: [id number] Patient Account Number: [id number] Date of Birth/Sex: Treating RN: [birth year] (81 y.o. [name] Primary Care Darrol Brandenburg: [name] Other Clinician: CLEVELAND, YARBRO D ([id number]) [id number] Page 4 of 13 Referring Lacorey Brusca: Treating Jannatul Wojdyla/Extender: RO BSO N, MICHA EL Isaac Bliss Weeks in Treatment: 16 Edema Assessment Assessed: [Left: No] [Right: No] [Left: Edema] [Right: :] Calf Left: Right: Point of Measurement: From Medial Instep 39.5 cm Ankle Left: Right: Point of Measurement: From Medial Instep 28.6 cm Vascular Assessment Pulses: Dorsalis Pedis Palpable: [Right:Yes] Electronic Signature(s) Signed: 05/08/2022 1:38:26 PM By: Rosalio Loud MSN RN CNS WTA Entered By: Rosalio Loud on 05/08/2022 13:38:26 -------------------------------------------------------------------------------- Multi Wound Chart Details Patient Name: Date of Service: [name], RA LPH D. 05/08/2022 8:30 A M Medical Record Number: [id number] Patient Account Number: [id number] Date of Birth/Sex: Treating RN: April 24, 1941 (81 y.o. [name] Primary Care Noely Kuhnle: [name] Other Clinician: Referring Beth Goodlin: Treating Cruzita Lipa/Extender: RO BSO N, MICHA EL Odessa Fleming in  Treatment: 16 Vital Signs Height(in): 74 Pulse(bpm): 61 Weight(lbs): 244 Blood Pressure(mmHg): 129/69 Body Mass Index(BMI): 31.3 Temperature(F): 98.0 Respiratory Rate(breaths/min): 16 [10:Photos:] Right, Distal, Lateral Lower Leg Right, Plantar Foot Right, Lateral, Superior Lower Leg Wound Location: Gradually Appeared Gradually Appeared Skin Tear/Laceration Wounding Event: Abrasion Neuropathic Ulcer-Non Diabetic Skin Tear Primary Etiology: Congestive Heart Failure, Congestive Heart  Failure, Congestive Heart Failure, Comorbid History: Hypertension Hypertension Hypertension 05/03/2022 12/28/2020 04/09/2022 Date Acquired: JAQUA, CHING ([id number]) [id number] Page 5 of 13 0 16 4 Weeks of Treatment: Open Open Open Wound Status: No No No Wound Recurrence: 2x1.5x0.1 0.7x0.2x0.2 2x0.9x0.1 Measurements L x W x D (cm) 2.356 0.11 1.414 A (cm) : rea 0.236 0.022 0.141 Volume (cm) : N/A -39.20% -176.70% % Reduction in Area: N/A 8.30% -176.50% % Reduction in Volume: Partial Thickness Full Thickness Without Exposed Partial Thickness Classification: Support Structures Medium Medium Medium Exudate Amount: Serous Serosanguineous Serosanguineous Exudate Type: amber red, brown red, brown Exudate Color: N/A N/A Flat and Intact Wound Margin: Medium (34-66%) Small (1-33%) None Present (0%) Granulation Amount: Red, Friable Red N/A Granulation Quality: N/A None Present (0%) None Present (0%) Necrotic Amount: Fat Layer (Subcutaneous Tissue): Yes Fat Layer (Subcutaneous Tissue): Yes Fat Layer (Subcutaneous Tissue): Yes Exposed Structures: Fascia: No Fascia: No Fascia: No Tendon: No Tendon: No Tendon: No Muscle: No Muscle: No Muscle: No Joint: No Joint: No Joint: No Bone: No Bone: No Bone: No N/A Medium (34-66%) None Epithelialization: Treatment Notes Wound #10 (Lower Leg) Wound Laterality: Right, Lateral, Distal Cleanser Peri-Wound  Care Topical Primary Dressing Secondary Dressing Secured With Compression Wrap Compression Stockings Add-Ons Wound #2 (Foot) Wound Laterality: Plantar, Right Cleanser Wound Cleanser Discharge Instruction: Wash your hands with soap and water. Remove old dressing, discard into plastic bag and place into trash. Cleanse the wound with Wound Cleanser prior to applying a clean dressing using gauze sponges, not tissues or cotton balls. Do not scrub or use excessive force. Pat dry using gauze sponges, not tissue or cotton balls. Peri-Wound Care Topical Primary Dressing Prisma 4.34 (in) Discharge Instruction: Moisten w/normal saline or sterile water; Cover wound as directed. Do not remove from wound bed. Secondary Dressing Zetuvit Plus 4x8 (in/in) Secured With Tubigrip Size E, 3.5x10 (in/yds) Discharge Instruction: Apply 3 Tubigrip E 3-finger-widths below knee to base of toes to secure dressing and/or for swelling. Compression Wrap Compression Stockings Add-Ons Wound #9 (Lower Leg) Wound Laterality: Right, Lateral, Superior Cleanser Wound Cleanser Discharge Instruction: Wash your hands with soap and water. Remove old dressing, discard into plastic bag and place into trash. Cleanse the wound with Wound Cleanser prior to applying a clean dressing using gauze sponges, not tissues or cotton balls. Do not scrub or use excessive force. Pat dry using gauze sponges, not tissue or cotton balls. LONZO, SAULTER ([id number]) [id number] Page 6 of 13 Peri-Wound Care Topical Primary Dressing Secondary Dressing ABD Pad 5x9 (in/in) Discharge Instruction: Cover with ABD pad Secured With Conform 4'' - Conforming Stretch Gauze Bandage 4x75 (in/in) Discharge Instruction: Apply as directed Tubigrip Size E, 3.5x10 (in/yds) Discharge Instruction: Apply 3 Tubigrip E 3-finger-widths below knee to base of toes to secure dressing and/or for swelling. Compression Wrap Compression  Stockings Add-Ons Electronic Signature(s) Signed: 05/08/2022 1:39:54 PM By: Rosalio Loud MSN RN CNS WTA Entered By: Rosalio Loud on 05/08/2022 13:39:54 -------------------------------------------------------------------------------- Multi-Disciplinary Care Plan Details Patient Name: Date of Service: [name] [facility] D. 05/08/2022 8:30 A M Medical Record Number: [id number] Patient Account Number: [id number] Date of Birth/Sex: Treating RN: 10-Nov-1940 (81 y.o. [name] Primary Care Laketha Leopard: [name] Other Clinician: Referring Conrad Zajkowski: Treating Mannington Carmack/Extender: [name], MICHA EL Odessa Fleming in Treatment: 16 Active Inactive Necrotic Tissue Nursing Diagnoses: Impaired tissue integrity related to necrotic/devitalized tissue Knowledge deficit related to management of necrotic/devitalized tissue Goals: Necrotic/devitalized tissue will be minimized in the wound bed Date Initiated: 03/31/2022  Target Resolution Date: 03/31/2022 Goal Status: Active Patient/caregiver will verbalize understanding of reason and process for debridement of necrotic tissue Date Initiated: 03/31/2022 Target Resolution Date: 03/31/2022 Goal Status: Active Interventions: Assess patient pain level pre-, during and post procedure and prior to discharge Provide education on necrotic tissue and debridement process Treatment Activities: Excisional debridement : 03/31/2022 Notes: DESMON, HITCHNER ([id number]) [id number] Page 7 of 13 Wound/Skin Impairment Nursing Diagnoses: Knowledge deficit related to ulceration/compromised skin integrity Goals: Patient/caregiver will verbalize understanding of skin care regimen Date Initiated: 01/16/2022 Target Resolution Date: 02/16/2022 Goal Status: Active Ulcer/skin breakdown will have a volume reduction of 30% by week 4 Date Initiated: 01/16/2022 Target Resolution Date: 02/16/2022 Goal Status: Active Ulcer/skin breakdown will have a  volume reduction of 50% by week 8 Date Initiated: 01/16/2022 Target Resolution Date: 03/19/2022 Goal Status: Active Ulcer/skin breakdown will have a volume reduction of 80% by week 12 Date Initiated: 01/16/2022 Target Resolution Date: 04/18/2022 Goal Status: Active Ulcer/skin breakdown will heal within 14 weeks Date Initiated: 01/16/2022 Target Resolution Date: 05/19/2022 Goal Status: Active Interventions: Assess patient/caregiver ability to obtain necessary supplies Assess patient/caregiver ability to perform ulcer/skin care regimen upon admission and as needed Assess ulceration(s) every visit Notes: Electronic Signature(s) Signed: 05/08/2022 1:39:25 PM By: Rosalio Loud MSN RN CNS WTA Entered By: Rosalio Loud on 05/08/2022 13:39:25 -------------------------------------------------------------------------------- Pain Assessment Details Patient Name: Date of Service: [name] [facility] D. 05/08/2022 8:30 A M Medical Record Number: [id number] Patient Account Number: [id number] Date of Birth/Sex: Treating RN: [birth year] (81 y.o. [name] Primary Care Simran Mannis: [name] Other Clinician: Referring Ariany Kesselman: Treating Maicy Filip/Extender: [name], MICHA EL Odessa Fleming in Treatment: 16 Active Problems Location of Pain Severity and Description of Pain Patient Has Paino No Site Locations HARVARD, ZEISS D ([id number]) [id number] Page 8 of 13 Pain Management and Medication Current Pain Management: Electronic Signature(s) Signed: 05/08/2022 1:38:03 PM By: Rosalio Loud MSN RN CNS WTA Entered By: Rosalio Loud on 05/08/2022 13:38:02 -------------------------------------------------------------------------------- Patient/Caregiver Education Details Patient Name: Date of Service: [name] [facility] D. 11/9/2023andnbsp8:30 A M Medical Record Number: [id number] Patient Account Number: [id number] Date of Birth/Gender: Treating RN: 11-Jul-1940 (81 y.o. [name] Primary Care Physician: [name] Other Clinician: Referring Physician: Treating Physician/Extender: RO BSO Delane Ginger, [place] EL Odessa Fleming in Treatment: 16 Education Assessment Education Provided To: Patient Education Topics Provided Wound/Skin Impairment: Handouts: Caring for Your Ulcer Methods: Explain/Verbal Responses: State content correctly Electronic Signature(s) Signed: 05/08/2022 4:19:44 PM By: Rosalio Loud MSN RN CNS WTA Entered By: Rosalio Loud on 05/08/2022 13:42:02 Denison, Deidre Ala D ([id number]) [id number] Page 9 of 13 -------------------------------------------------------------------------------- Wound Assessment Details Patient Name: Date of Service: [name] [facility] D. 05/08/2022 8:30 A M Medical Record Number: [id number] Patient Account Number: [id number] Date of Birth/Sex: Treating RN: [birth year] (81 y.o. [name] Primary Care Margaruite Top: [name] Other Clinician: Referring Keir Viernes: Treating Alyzah Pelly/Extender: RO BSO N, MICHA EL Odessa Fleming in Treatment: 16 Wound Status Wound Number: 10 Primary Etiology: Abrasion Wound Location: Right, Distal, Lateral Lower Leg Wound Status: Open Wounding Event: Gradually Appeared Comorbid History: Congestive Heart Failure, Hypertension Date Acquired: 05/03/2022 Weeks Of Treatment: 0 Clustered Wound: No Photos Wound Measurements Length: (cm) Width: (cm) Depth: (cm) Area: (cm) Volume: (cm) 2 % Reduction in Area: 1.5 % Reduction in Volume: 0.1 2.356 0.236 Wound Description Classification: Partial Thickness Exudate Amount: Medium Exudate Type: Serous Exudate Color: amber Foul Odor After Cleansing: No Slough/Fibrino No Wound Bed Granulation Amount:  Medium (34-66%) Exposed Structure Granulation Quality: Red, Friable Fascia Exposed: No Fat Layer (Subcutaneous Tissue) Exposed: Yes Tendon Exposed: No Muscle Exposed: No Joint Exposed: No Bone  Exposed: No Treatment Notes Wound #10 (Lower Leg) Wound Laterality: Right, Lateral, Distal Cleanser Peri-Wound Care Topical Rackham, Trayden D ([id number]) [id number] Page 10 of 13 Primary Dressing Secondary Dressing Secured With Compression Wrap Compression Stockings Add-Ons Electronic Signature(s) Signed: 05/08/2022 4:19:44 PM By: Rosalio Loud MSN RN CNS WTA Entered By: Rosalio Loud on 05/08/2022 08:38:11 -------------------------------------------------------------------------------- Wound Assessment Details Patient Name: Date of Service: [name] [facility] D. 05/08/2022 8:30 A M Medical Record Number: [id number] Patient Account Number: [id number] Date of Birth/Sex: Treating RN: 1940-07-17 (81 y.o. [name] Primary Care Adreona Brand: [name] Other Clinician: Referring Aaban Griep: Treating Judie Hollick/Extender: RO BSO N, MICHA EL Odessa Fleming in Treatment: 16 Wound Status Wound Number: 2 Primary Etiology: Neuropathic Ulcer-Non Diabetic Wound Location: Right, Plantar Foot Wound Status: Open Wounding Event: Gradually Appeared Comorbid History: Congestive Heart Failure, Hypertension Date Acquired: 12/28/2020 Weeks Of Treatment: 16 Clustered Wound: No Photos Wound Measurements Length: (cm) 0.7 Width: (cm) 0.2 Depth: (cm) 0.2 Area: (cm) 0.11 Volume: (cm) 0.022 % Reduction in Area: -39.2% % Reduction in Volume: 8.3% Epithelialization: Medium (34-66%) Wound Description Classification: Full Thickness Without Exposed Support Structures Exudate Amount: Medium Exudate Type: Serosanguineous Exudate Color: red, brown Foul Odor After Cleansing: No Slough/Fibrino No Wound Bed Granulation Amount: Small (1-33%) Exposed Structure Chavis, Bunnie D ([id number]) [id number] Page 11 of 13 Granulation Quality: Red Fascia Exposed: No Necrotic Amount: None Present (0%) Fat Layer (Subcutaneous Tissue) Exposed: Yes Tendon  Exposed: No Muscle Exposed: No Joint Exposed: No Bone Exposed: No Treatment Notes Wound #2 (Foot) Wound Laterality: Plantar, Right Cleanser Wound Cleanser Discharge Instruction: Wash your hands with soap and water. Remove old dressing, discard into plastic bag and place into trash. Cleanse the wound with Wound Cleanser prior to applying a clean dressing using gauze sponges, not tissues or cotton balls. Do not scrub or use excessive force. Pat dry using gauze sponges, not tissue or cotton balls. Peri-Wound Care Topical Primary Dressing Prisma 4.34 (in) Discharge Instruction: Moisten w/normal saline or sterile water; Cover wound as directed. Do not remove from wound bed. Secondary Dressing Zetuvit Plus 4x8 (in/in) Secured With Tubigrip Size E, 3.5x10 (in/yds) Discharge Instruction: Apply 3 Tubigrip E 3-finger-widths below knee to base of toes to secure dressing and/or for swelling. Compression Wrap Compression Stockings Add-Ons Electronic Signature(s) Signed: 05/08/2022 4:19:44 PM By: Rosalio Loud MSN RN CNS WTA Entered By: Rosalio Loud on 05/08/2022 08:39:18 -------------------------------------------------------------------------------- Wound Assessment Details Patient Name: Date of Service: [name] [facility] D. 05/08/2022 8:30 A M Medical Record Number: [id number] Patient Account Number: [id number] Date of Birth/Sex: Treating RN: [birth year] (81 y.o. [name] Primary Care Saanvi Hakala: [name] Other Clinician: Referring Annsleigh Dragoo: Treating Travontae Freiberger/Extender: RO BSO N, MICHA EL Isaac Bliss Weeks in Treatment: 16 Wound Status Wound Number: 9 Primary Etiology: Skin Tear Wound Location: Right, Lateral, Superior Lower Leg Wound Status: Open Wounding Event: Skin Tear/Laceration Comorbid History: Congestive Heart Failure, Hypertension Date Acquired: 04/09/2022 Weeks Of Treatment: 4 Clustered Wound: No Photos JETER, TOMEY D ([id number])  [id number] Page 12 of 13 Wound Measurements Length: (cm) 2 Width: (cm) 0.9 Depth: (cm) 0.1 Area: (cm) 1.414 Volume: (cm) 0.141 % Reduction in Area: -176.7% % Reduction in Volume: -176.5% Epithelialization: None Wound Description Classification: Partial Thickness Wound Margin: Flat and Intact Exudate Amount: Medium Exudate Type: Serosanguineous Exudate Color: red, brown Foul Odor  After Cleansing: No Slough/Fibrino No Wound Bed Granulation Amount: None Present (0%) Exposed Structure Necrotic Amount: None Present (0%) Fascia Exposed: No Fat Layer (Subcutaneous Tissue) Exposed: Yes Tendon Exposed: No Muscle Exposed: No Joint Exposed: No Bone Exposed: No Treatment Notes Wound #9 (Lower Leg) Wound Laterality: Right, Lateral, Superior Cleanser Wound Cleanser Discharge Instruction: Wash your hands with soap and water. Remove old dressing, discard into plastic bag and place into trash. Cleanse the wound with Wound Cleanser prior to applying a clean dressing using gauze sponges, not tissues or cotton balls. Do not scrub or use excessive force. Pat dry using gauze sponges, not tissue or cotton balls. Peri-Wound Care Topical Primary Dressing Secondary Dressing ABD Pad 5x9 (in/in) Discharge Instruction: Cover with ABD pad Secured With Conform 4'' - Conforming Stretch Gauze Bandage 4x75 (in/in) Discharge Instruction: Apply as directed Tubigrip Size E, 3.5x10 (in/yds) Discharge Instruction: Apply 3 Tubigrip E 3-finger-widths below knee to base of toes to secure dressing and/or for swelling. Compression Wrap Compression Stockings Add-Ons Electronic Signature(s) Signed: 05/08/2022 4:19:44 PM By: Rosalio Loud MSN RN CNS WTA Entered By: Rosalio Loud on 05/08/2022 08:38:42 [name] ([id number]) [id number] Page 13 of 13 -------------------------------------------------------------------------------- Vitals Details Patient  Name: Date of Service: [name] [facility] D. 05/08/2022 8:30 A M Medical Record Number: [id number] Patient Account Number: [id number] Date of Birth/Sex: Treating RN: Sep 28, 1940 (81 y.o. [name] Primary Care Laurette Villescas: [name] Other Clinician: Referring Brandi Armato: Treating Genevive Printup/Extender: RO BSO N, MICHA EL Odessa Fleming in Treatment: 16 Vital Signs Time Taken: 08:25 Temperature (F): 98.0 Height (in): 74 Pulse (bpm): 61 Weight (lbs): 244 Respiratory Rate (breaths/min): 16 Body Mass Index (BMI): 31.3 Blood Pressure (mmHg): 129/69 Reference Range: 80 - 120 mg / dl Electronic Signature(s) Signed: 05/08/2022 1:37:32 PM By: Rosalio Loud MSN RN CNS WTA Entered By: Rosalio Loud on 05/08/2022 13:37:32

## 2022-05-08 NOTE — Progress Notes (Signed)
[name] ([id number]) [id number] Page 1 of 8 Visit Report for 05/08/2022 HPI Details Patient Name: Date of Service: [name] [facility] [name]. 05/08/2022 8:30 A M Medical Record Number: [id number] Patient Account Number: [id number] Date of Birth/Sex: Treating RN: 08/26/1940 (81 y.o. [name] Primary Care Provider: [name] Other Clinician: Referring Provider: Treating Provider/Extender: [name], MICHA EL Odessa Fleming in Treatment: 16 History of Present Illness HPI Description: 01-16-2022 upon evaluation today patient presents for initial inspection here in the clinic concerning issues that he has been having with wounds over the bilateral lower extremities and bilateral feet. Fortunately there does not appear to be any signs of significant infection at this point which is great news. Unfortunately his ABIs were registering at 0.49 on the left and 0.57 on the right here in the clinic on the screening today. With that being said I do think that it may possibly be true that we need to get him to formal arterial studies in order to see how things really are showing up. And the patient's not in disagreement with this for that reason we are going to make that referral as well. With that being said he does have some significant past medical history items of note detailed below. Patient does have chronic kidney disease stage IIIb, coronary artery disease, long-term use of anticoagulant therapy, dilated cardiomyopathy. Proximal atrial fibrillation, he is on Xarelto long-term. He also has congestive heart failure, hypertension, peripheral neuropathy not related to diabetes as he is only prediabetic with a hemoglobin A1c of 5.8, and chronic venous hypertension. He has recently seen Dr. [name] show his cardiologist who did discontinue the hydrochlorothiazide and the Lasix and felt that the patient was retaining more fluid than he should be. For that reason he  was started on furosemide 20 mg twice daily. 7/27; patient with bilateral lower extremity wounds. These are largely venous although he had very poor ABIs on screening test here. He has an appointment apparently tomorrow at vein and vascular for arterial studies. He was not too keen on the latter although I think I emphasized the need to do this both in terms of dealing with the current wounds and being prepared for further issues down the road. We have been using silver alginate on the wounds and Tubigrip's edema control is better per our intake nurse 02-06-2022 upon evaluation today patient appears to be doing well with regard to his wound. Fortunately there is no signs of infection he is actually healing for the most part on the bilateral feet. His legs are also doing much better though he has 1 new area that popped up on the right leg. Fortunately there is no evidence of active infection of note his ABIs and TBI's were poor on the vascular screening on the 31st with the left being worse than the right. For that reason I am going to recommend he still continue to follow-up with vascular to discuss angioplasty/stenting. [id number] upon evaluation today patient appears to be doing well currently in regard to his wound on the leg although he has several other blisters that are showing up at this point. I do believe that we need to try to do something to get his swelling under control this could be of utmost importance as far as getting this healed is concerned. 03-06-2022 upon evaluation today patient appears to be doing well currently in regard to his wounds. In fact the legs are drying up and seem to be healing quite  nicely the lateral wounds are still weeping a little bit about the medial side actually appears to be pretty much dry and closed. The plantar aspect of his foot still is draining a bit but again it seems to be doing much better which is great news.[name] 03-13-2022 upon evaluation patient appears  to be almost completely healed. His leg is doing excellent and his foot is just about closed. With that being said he did see vascular and they actually discussed the possibility of doing an arteriogram/angiogram in order to see if anything needed to be the balloons were stented open. With that being said he decided not to do that since things are healing so well right now and he is going to consider that for future they plan to see him back in November. 03-21-2022 upon evaluation today patient appears to be doing a little worse in regard to his wound on the foot. The wound keeps seeming to pull together unfortunately which is causing a trapped some fluid is just not healing quite as well as would like to see. With that being said I am going to try to remove a bit more of the overgrowth callus tissue today and hopefully that can help get this to seal up much more effectively and quickly. Fortunately I see no signs of infection of the foot although the leg on the right does seem to show some signs of erythema today I do think we need to address that currently. This looks to be cellulitis. 03-31-2022 upon evaluation today patient appears to be doing better in regard to the cellulitis I am pleased in that regard he is still taking the antibiotics. Fortunately I do not see any evidence of active infection locally or systemically at this time which is great news. No fevers, chills, nausea, vomiting, or diarrhea. 04-10-2022 upon evaluation today patient appears to be doing well currently in regard to his foot ulcer I think were very close to complete resolution which is great news. Fortunately I do not see any signs of active infection locally or systemically at this time. No fevers, chills, nausea, vomiting, or diarrhea. 04-24-2022 upon evaluation today patient appears to be doing about the same in regard to his foot ulcer. Were not really seeing much in the way of improvements here. With that being said I  do believe that he is tolerating the dressing changes without complication. No fevers, chills, nausea, vomiting, or diarrhea. With that being said the wound on his right plantar foot just does not seem to be making progress here. I do believe that he may benefit from vascular intervention which we previously discussed but he is previously wanted to hold off on. However at this point I think that this is becoming more crucial and he did ask me about getting in touch with the vascular doctor in order to see if they can get him scheduled sooner his next follow-up until like the end of December. I am more than happy to do this as I think it would be definitely beneficial for him. [name], [name] ([id number]) [id number] Page 2 of 8 05-01-2022 upon evaluation patient's wound bed actually showed signs of good granulation and epithelization at this point all things considered. Still his ABIs are very low is about 50% on each leg. With that being said he does have a follow-up with vascular in about 2 weeks maybe 2-1/2 weeks where he is going to have the angiogram for the right leg and then subsequently the left leg  will be to follow. Nonetheless I think this is can make a big difference in getting the wounds healed to be honest. Fortunately I do not see any signs of active infection at this point. 11/9; patient arrives in clinic with 2 new superficial wounds on the right leg and right ankle. Both of these and the same condition of the original wound. In terms of the wound on his plantar foot about the same. We have been using collagen. His arterial review/angiogram is due for November 21 Electronic Signature(s) Signed: 05/08/2022 3:56:39 PM By: Linton Ham MD Entered By: Linton Ham on 05/08/2022 09:20:46 -------------------------------------------------------------------------------- Physical Exam Details Patient Name: Date of Service: [name] [facility] [name]. 05/08/2022  8:30 A M Medical Record Number: [id number] Patient Account Number: [id number] Date of Birth/Sex: Treating RN: [birth year] (81 y.o. [name] Primary Care Provider: [name] Other Clinician: Referring Provider: Treating Provider/Extender: RO BSO N, MICHA EL Odessa Fleming in Treatment: 16 Constitutional Sitting or standing Blood Pressure is within target range for patient.. Pulse regular and within target range for patient.[name] Respirations regular, non-labored and within target range.[name] appears in no distress. Cardiovascular Pedal pulses absentAt both the dorsalis pedis and posterior tibial. Notes Wound exam; wound exam; he has poorly controlled edema in the right leg and foot. As result of this he has more stasis dermatitis in the right lower leg but no tenderness or warmth. He has the 2 new open wounds 1 distally to his original wound and one in the ankle area. Both of these are superficial. The area on the plantar foot is about the same may be some epithelialization in the proximal 50% but still a small probing area distally. There is no evidence of infection Electronic Signature(s) Signed: 05/08/2022 3:56:39 PM By: Linton Ham MD Entered By: Linton Ham on 05/08/2022 09:25:26 -------------------------------------------------------------------------------- Physician Orders Details Patient Name: Date of Service: [name] [facility] [name]. 05/08/2022 8:30 A M Medical Record Number: [id number] Patient Account Number: [id number] Date of Birth/Sex: Treating RN: 1940-09-07 (81 y.o. [name] Primary Care Provider: [name] Other Clinician: Referring Provider: Treating Provider/Extender: [name], MICHA EL Odessa Fleming in Treatment: [address] [place] [name] ([id number]) [id number] Page 3 of 8 Verbal / Phone Orders: No Diagnosis Coding Follow-up Appointments Return Appointment in 1 week. Nurse Visit as needed Bathing/ Shower/  Hygiene May shower with wound dressing protected with water repellent cover or cast protector. Edema Control - Lymphedema / Segmental Compressive Device / Other Tubigrip double layer applied - tubi E double layer Elevate, Exercise Daily and A void Standing for Long Periods of Time. Elevate legs to the level of the heart and pump ankles as often as possible Elevate leg(s) parallel to the floor when sitting. Off-Loading Right Lower Extremity Open toe surgical shoe with peg assist. Wound Treatment Wound #2 - Foot Wound Laterality: Plantar, Right Cleanser: Wound Cleanser 3 x Per Week/30 Days Discharge Instructions: Wash your hands with soap and water. Remove old dressing, discard into plastic bag and place into trash. Cleanse the wound with Wound Cleanser prior to applying a clean dressing using gauze sponges, not tissues or cotton balls. Do not scrub or use excessive force. Pat dry using gauze sponges, not tissue or cotton balls. Prim Dressing: Prisma 4.34 (in) 3 x Per Week/30 Days ary Discharge Instructions: Moisten w/normal saline or sterile water; Cover wound as directed. Do not remove from wound bed. Secondary Dressing: Zetuvit Plus 4x8 (in/in) 3 x Per Week/30 Days Secured  With: Tubigrip Size E, 3.5x10 (in/yds) 3 x Per Week/30 Days Discharge Instructions: Apply 3 Tubigrip E 3-finger-widths below knee to base of toes to secure dressing and/or for swelling. Wound #9 - Lower Leg Wound Laterality: Right, Lateral, Superior Cleanser: Wound Cleanser 3 x Per Week/30 Days Discharge Instructions: Wash your hands with soap and water. Remove old dressing, discard into plastic bag and place into trash. Cleanse the wound with Wound Cleanser prior to applying a clean dressing using gauze sponges, not tissues or cotton balls. Do not scrub or use excessive force. Pat dry using gauze sponges, not tissue or cotton balls. Secondary Dressing: ABD Pad 5x9 (in/in) 3 x Per Week/30 Days Discharge Instructions:  Cover with ABD pad Secured With: Conform 4'' - Conforming Stretch Gauze Bandage 4x75 (in/in) 3 x Per Week/30 Days Discharge Instructions: Apply as directed Secured With: Tubigrip Size E, 3.5x10 (in/yds) 3 x Per Week/30 Days Discharge Instructions: Apply 3 Tubigrip E 3-finger-widths below knee to base of toes to secure dressing and/or for swelling. Electronic Signature(s) Signed: 05/08/2022 3:56:39 PM By: Linton Ham MD Signed: 05/08/2022 4:19:44 PM By: Rosalio Loud MSN RN CNS WTA Entered By: Rosalio Loud on 05/08/2022 13:40:32 -------------------------------------------------------------------------------- Problem List Details Patient Name: Date of Service: [name] [facility] [name]. 05/08/2022 8:30 A M Medical Record Number: [id number] Patient Account Number: [id number] Date of Birth/Sex: Treating RN: 1941/04/06 (81 y.o. [name] Primary Care Provider: [name] Other Clinician: JAYCE, [name] ([id number]) [id number] Page 4 of 8 Referring Provider: Treating Provider/Extender: RO BSO N, MICHA EL Isaac Bliss Weeks in Treatment: 16 Active Problems ICD-10 Encounter Code Description Active Date MDM Diagnosis I87.333 Chronic venous hypertension (idiopathic) with ulcer and inflammation of 01/16/2022 No Yes bilateral lower extremity L97.812 Non-pressure chronic ulcer of other part of right lower leg with fat layer 01/16/2022 No Yes exposed L97.822 Non-pressure chronic ulcer of other part of left lower leg with fat layer exposed 01/16/2022 No Yes G90.09 Other idiopathic peripheral autonomic neuropathy 01/16/2022 No Yes L97.522 Non-pressure chronic ulcer of other part of left foot with fat layer exposed 01/16/2022 No Yes L97.512 Non-pressure chronic ulcer of other part of right foot with fat layer exposed 01/16/2022 No Yes I10 Essential (primary) hypertension 01/16/2022 No Yes I50.42 Chronic combined systolic (congestive) and diastolic (congestive) heart  failure 01/16/2022 No Yes I48.0 Paroxysmal atrial fibrillation 01/16/2022 No Yes I42.0 Dilated cardiomyopathy 01/16/2022 No Yes Z79.01 Long term (current) use of anticoagulants 01/16/2022 No Yes I25.10 Atherosclerotic heart disease of native coronary artery without angina pectoris 01/16/2022 No Yes N18.32 Chronic kidney disease, stage 3b 01/16/2022 No Yes Inactive Problems Resolved Problems Electronic Signature(s) Signed: 05/08/2022 3:56:39 PM By: Linton Ham MD Entered By: Linton Ham on 05/08/2022 09:19:54 Latterell, Deidre Ala [name] ([id number]) [id number] Page 5 of 8 -------------------------------------------------------------------------------- Progress Note Details Patient Name: Date of Service: [name] [facility] [name]. 05/08/2022 8:30 A M Medical Record Number: [id number] Patient Account Number: [id number] Date of Birth/Sex: Treating RN: June 27, 1941 (81 y.o. [name] Primary Care Provider: [name] Other Clinician: Referring Provider: Treating Provider/Extender: [name], MICHA EL Odessa Fleming in Treatment: 16 Subjective History of Present Illness (HPI) 01-16-2022 upon evaluation today patient presents for initial inspection here in the clinic concerning issues that he has been having with wounds over the bilateral lower extremities and bilateral feet. Fortunately there does not appear to be any signs of significant infection at this point which is great news. Unfortunately his ABIs were registering at 0.49 on the left and  0.57 on the right here in the clinic on the screening today. With that being said I do think that it may possibly be true that we need to get him to formal arterial studies in order to see how things really are showing up. And the patient's not in disagreement with this for that reason we are going to make that referral as well. With that being said he does have some significant past medical history items of note detailed  below. Patient does have chronic kidney disease stage IIIb, coronary artery disease, long-term use of anticoagulant therapy, dilated cardiomyopathy. Proximal atrial fibrillation, he is on Xarelto long-term. He also has congestive heart failure, hypertension, peripheral neuropathy not related to diabetes as he is only prediabetic with a hemoglobin A1c of 5.8, and chronic venous hypertension. He has recently seen Dr. [name] show his cardiologist who did discontinue the hydrochlorothiazide and the Lasix and felt that the patient was retaining more fluid than he should be. For that reason he was started on furosemide 20 mg twice daily. 7/27; patient with bilateral lower extremity wounds. These are largely venous although he had very poor ABIs on screening test here. He has an appointment apparently tomorrow at vein and vascular for arterial studies. He was not too keen on the latter although I think I emphasized the need to do this both in terms of dealing with the current wounds and being prepared for further issues down the road. We have been using silver alginate on the wounds and Tubigrip's edema control is better per our intake nurse 02-06-2022 upon evaluation today patient appears to be doing well with regard to his wound. Fortunately there is no signs of infection he is actually healing for the most part on the bilateral feet. His legs are also doing much better though he has 1 new area that popped up on the right leg. Fortunately there is no evidence of active infection of note his ABIs and TBI's were poor on the vascular screening on the 31st with the left being worse than the right. For that reason I am going to recommend he still continue to follow-up with vascular to discuss angioplasty/stenting. 8-124-23 upon evaluation today patient appears to be doing well currently in regard to his wound on the leg although he has several other blisters that are showing up at this point. I do believe that we  need to try to do something to get his swelling under control this could be of utmost importance as far as getting this healed is concerned. 03-06-2022 upon evaluation today patient appears to be doing well currently in regard to his wounds. In fact the legs are drying up and seem to be healing quite nicely the lateral wounds are still weeping a little bit about the medial side actually appears to be pretty much dry and closed. The plantar aspect of his foot still is draining a bit but again it seems to be doing much better which is great news.[name] 03-13-2022 upon evaluation patient appears to be almost completely healed. His leg is doing excellent and his foot is just about closed. With that being said he did see vascular and they actually discussed the possibility of doing an arteriogram/angiogram in order to see if anything needed to be the balloons were stented open. With that being said he decided not to do that since things are healing so well right now and he is going to consider that for future they plan to see him back in November.  03-21-2022 upon evaluation today patient appears to be doing a little worse in regard to his wound on the foot. The wound keeps seeming to pull together unfortunately which is causing a trapped some fluid is just not healing quite as well as would like to see. With that being said I am going to try to remove a bit more of the overgrowth callus tissue today and hopefully that can help get this to seal up much more effectively and quickly. Fortunately I see no signs of infection of the foot although the leg on the right does seem to show some signs of erythema today I do think we need to address that currently. This looks to be cellulitis. 03-31-2022 upon evaluation today patient appears to be doing better in regard to the cellulitis I am pleased in that regard he is still taking the antibiotics. Fortunately I do not see any evidence of active infection locally or  systemically at this time which is great news. No fevers, chills, nausea, vomiting, or diarrhea. 04-10-2022 upon evaluation today patient appears to be doing well currently in regard to his foot ulcer I think were very close to complete resolution which is great news. Fortunately I do not see any signs of active infection locally or systemically at this time. No fevers, chills, nausea, vomiting, or diarrhea. 04-24-2022 upon evaluation today patient appears to be doing about the same in regard to his foot ulcer. Were not really seeing much in the way of improvements here. With that being said I do believe that he is tolerating the dressing changes without complication. No fevers, chills, nausea, vomiting, or diarrhea. With that being said the wound on his right plantar foot just does not seem to be making progress here. I do believe that he may benefit from vascular intervention which we previously discussed but he is previously wanted to hold off on. However at this point I think that this is becoming more crucial and he did ask me about getting in touch with the vascular doctor in order to see if they can get him scheduled sooner his next follow-up until like the end of December. I am more than happy to do this as I think it would be definitely beneficial for him. 05-01-2022 upon evaluation patient's wound bed actually showed signs of good granulation and epithelization at this point all things considered. Still his ABIs are very low is about 50% on each leg. With that being said he does have a follow-up with vascular in about 2 weeks maybe 2-1/2 weeks where he is going to have the angiogram for the right leg and then subsequently the left leg will be to follow. Nonetheless I think this is can make a big difference in getting the wounds healed to be honest. Fortunately I do not see any signs of active infection at this point. [name], [name] ([id number]) [id number] Page 6  of 8 11/9; patient arrives in clinic with 2 new superficial wounds on the right leg and right ankle. Both of these and the same condition of the original wound. In terms of the wound on his plantar foot about the same. We have been using collagen. His arterial review/angiogram is due for November 21 Objective Constitutional Sitting or standing Blood Pressure is within target range for patient.. Pulse regular and within target range for patient.[name] Respirations regular, non-labored and within target range.[name] appears in no distress. Vitals Time Taken: 8:25 AM, Height: 74 in, Weight: 244 lbs, BMI: 31.3, Temperature: 98.0 F,  Pulse: 61 bpm, Respiratory Rate: 16 breaths/min, Blood Pressure: 129/69 mmHg. Cardiovascular Pedal pulses absentAt both the dorsalis pedis and posterior tibial. General Notes: Wound exam; wound exam; he has poorly controlled edema in the right leg and foot. As result of this he has more stasis dermatitis in the right lower leg but no tenderness or warmth. He has the 2 new open wounds 1 distally to his original wound and one in the ankle area. Both of these are superficial. The area on the plantar foot is about the same may be some epithelialization in the proximal 50% but still a small probing area distally. There is no evidence of infection Integumentary (Hair, Skin) Wound #10 status is Open. Original cause of wound was Gradually Appeared. The date acquired was: 05/03/2022. The wound is located on the Right,Distal,Lateral Lower Leg. The wound measures 2cm length x 1.5cm width x 0.1cm depth; 2.356cm^2 area and 0.236cm^3 volume. There is Fat Layer (Subcutaneous Tissue) exposed. There is a medium amount of serous drainage noted. There is medium (34-66%) red, friable granulation within the wound bed. Wound #2 status is Open. Original cause of wound was Gradually Appeared. The date acquired was: 12/28/2020. The wound has been in treatment 16 weeks. The wound is located on the  [place]. The wound measures 0.7cm length x 0.2cm width x 0.2cm depth; 0.11cm^2 area and 0.022cm^3 volume. There is Fat Layer (Subcutaneous Tissue) exposed. There is a medium amount of serosanguineous drainage noted. There is small (1-33%) red granulation within the wound bed. There is no necrotic tissue within the wound bed. Wound #9 status is Open. Original cause of wound was Skin T ear/Laceration. The date acquired was: 04/09/2022. The wound has been in treatment 4 weeks. The wound is located on the Right,Lateral,Superior Lower Leg. The wound measures 2cm length x 0.9cm width x 0.1cm depth; 1.414cm^2 area and 0.141cm^3 volume. There is Fat Layer (Subcutaneous Tissue) exposed. There is a medium amount of serosanguineous drainage noted. The wound margin is flat and intact. There is no granulation within the wound bed. There is no necrotic tissue within the wound bed. Assessment Active Problems ICD-10 Chronic venous hypertension (idiopathic) with ulcer and inflammation of bilateral lower extremity Non-pressure chronic ulcer of other part of right lower leg with fat layer exposed Non-pressure chronic ulcer of other part of left lower leg with fat layer exposed Other idiopathic peripheral autonomic neuropathy Non-pressure chronic ulcer of other part of left foot with fat layer exposed Non-pressure chronic ulcer of other part of right foot with fat layer exposed Essential (primary) hypertension Chronic combined systolic (congestive) and diastolic (congestive) heart failure Paroxysmal atrial fibrillation Dilated cardiomyopathy Long term (current) use of anticoagulants Atherosclerotic heart disease 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 Nurse Visit as needed Bathing/ Shower/ Hygiene: May shower with wound dressing protected with water repellent cover or cast protector. Edema  Control - Lymphedema / Segmental Compressive Device / Other: Tubigrip double layer applied - tubi E double layer Elevate, Exercise Daily and Avoid Standing for Long Periods of Time. Elevate legs to the level of the heart and pump ankles as often as possible Elevate leg(s) parallel to the floor when sitting. Off-LoadingCARTER, [name] ([id number]) [id number] Page 7 of 8 Open toe surgical shoe with peg assist. WOUND #2: - Foot Wound Laterality: Plantar, Right Cleanser: Wound Cleanser 3 x Per Week/30 Days Discharge Instructions: Wash your hands with soap and water. Remove old  dressing, discard into plastic bag and place into trash. Cleanse the wound with Wound Cleanser prior to applying a clean dressing using gauze sponges, not tissues or cotton balls. Do not scrub or use excessive force. Pat dry using gauze sponges, not tissue or cotton balls. Prim Dressing: Prisma 4.34 (in) 3 x Per Week/30 Days ary Discharge Instructions: Moisten w/normal saline or sterile water; Cover wound as directed. Do not remove from wound bed. Secondary Dressing: Zetuvit Plus 4x8 (in/in) 3 x Per Week/30 Days Secured With: Tubigrip Size E, 3.5x10 (in/yds) 3 x Per Week/30 Days Discharge Instructions: Apply 3 Tubigrip E 3-finger-widths below knee to base of toes to secure dressing and/or for swelling. WOUND #9: - Lower Leg Wound Laterality: Right, Lateral, Superior Cleanser: Wound Cleanser 3 x Per Week/30 Days Discharge Instructions: Wash your hands with soap and water. Remove old dressing, discard into plastic bag and place into trash. Cleanse the wound with Wound Cleanser prior to applying a clean dressing using gauze sponges, not tissues or cotton balls. Do not scrub or use excessive force. Pat dry using gauze sponges, not tissue or cotton balls. Secondary Dressing: ABD Pad 5x9 (in/in) 3 x Per Week/30 Days Discharge Instructions: Cover with ABD pad Secured With: Conform 4'' - Conforming  Stretch Gauze Bandage 4x75 (in/in) 3 x Per Week/30 Days Discharge Instructions: Apply as directed Secured With: Tubigrip Size E, 3.5x10 (in/yds) 3 x Per Week/30 Days Discharge Instructions: Apply 3 Tubigrip E 3-finger-widths below knee to base of toes to secure dressing and/or for swelling. 1. Still collagen to all wounds. As it turns out he was not moistening the collagen we gave him some lubricant and I told him that [facility] would be fine if he runs out of this. 2. The swelling in his leg is not well controlled and as result he is probably had some weight worsening stasis dermatitis. Initially in our intake this raised some concern about infection although I really do not think that is the case. I really wanted to wrap him however given the arterial disease I am going to put this off until after his angiogram which is on November 21. 3. Still using now moistened silver collagen and Tubigrip. Electronic Signature(s) Signed: 05/08/2022 3:56:39 PM By: Linton Ham MD Entered By: Linton Ham on 05/08/2022 09:26:50 -------------------------------------------------------------------------------- SuperBill Details Patient Name: Date of Service: [name], [name] LPH [name]. 05/08/2022 Medical Record Number: [id number] Patient Account Number: [id number] Date of Birth/Sex: Treating RN: 04-08-1941 (81 y.o. [name] Primary Care Provider: [name] Other Clinician: Referring Provider: Treating Provider/Extender: [name], MICHA EL Odessa Fleming in Treatment: 16 Diagnosis Coding ICD-10 Codes Code Description [phone] Chronic venous hypertension (idiopathic) with ulcer and inflammation of bilateral lower extremity L97.812 Non-pressure chronic ulcer of other part of right lower leg with fat layer exposed L97.822 Non-pressure chronic ulcer of other part of left lower leg with fat layer exposed G90.09 Other idiopathic peripheral autonomic neuropathy L97.522 Non-pressure chronic ulcer of other  part of left foot with fat layer exposed L97.512 Non-pressure chronic ulcer of other part of right foot with fat layer exposed I10 Essential (primary) hypertension I50.42 Chronic combined systolic (congestive) and diastolic (congestive) heart failure I48.0 Paroxysmal atrial fibrillation I42.0 Dilated cardiomyopathy Z79.01 Long term (current) use of anticoagulants I25.10 Atherosclerotic heart disease of native coronary artery without angina pectoris [name], [name] ([id number]) [id number] Page 8 of 8 N18.32 Chronic kidney disease, stage 3b Facility Procedures : CPT4 Code: [id number] Description: 84696 - WOUND CARE VISIT-LEV 4  EST PT Modifier: Quantity: 1 Physician Procedures : CPT4 Code Description Modifier [id number] - WC PHYS LEVEL 4 - EST PT ICD-10 Diagnosis Description I87.333 Chronic venous hypertension (idiopathic) with ulcer and inflammation of bilateral lower extremity L97.812 Non-pressure chronic ulcer of other  part of right lower leg with fat layer exposed L97.512 Non-pressure chronic ulcer of other part of right foot with fat layer exposed Quantity: 1 Electronic Signature(s) Signed: 05/08/2022 3:56:39 PM By: Linton Ham MD Entered By: Linton Ham on 05/08/2022 09:31:45

## 2022-05-13 ENCOUNTER — Telehealth (INDEPENDENT_AMBULATORY_CARE_PROVIDER_SITE_OTHER): Payer: Self-pay

## 2022-05-13 NOTE — Telephone Encounter (Signed)
I spoke with the patient and he was given his new 12:30 pm arrival time to the [facility] office for his RLE angio with Dr. Delana Meyer. Patient stated he understood.

## 2022-05-15 ENCOUNTER — Encounter: Payer: PPO | Admitting: Physician Assistant

## 2022-05-15 DIAGNOSIS — L97512 Non-pressure chronic ulcer of other part of right foot with fat layer exposed: Secondary | ICD-10-CM | POA: Diagnosis not present

## 2022-05-15 DIAGNOSIS — L97812 Non-pressure chronic ulcer of other part of right lower leg with fat layer exposed: Secondary | ICD-10-CM | POA: Diagnosis not present

## 2022-05-15 DIAGNOSIS — I87333 Chronic venous hypertension (idiopathic) with ulcer and inflammation of bilateral lower extremity: Secondary | ICD-10-CM | POA: Diagnosis not present

## 2022-05-15 NOTE — Progress Notes (Addendum)
MARKEVIUS, TROMBETTA ([id number]) [id number] Page 1 of 8 Visit Report for 05/15/2022 Chief Complaint Document Details Patient Name: Date of Service: [name] [facility] D. 05/15/2022 8:30 A M Medical Record Number: [id number] Patient Account Number: [id number] Date of Birth/Sex: Treating RN: 05-07-1941 (81 y.o. [name] Primary Care Provider: [name] Other Clinician: Referring Provider: Treating Provider/Extender: [name] in Treatment: 17 Information Obtained from: Patient Chief Complaint Multiple LE Ulcers Electronic Signature(s) Signed: 05/15/2022 8:39:42 AM By: Worthy Keeler PA-C Entered By: Worthy Keeler on 05/15/2022 08:39:42 -------------------------------------------------------------------------------- HPI Details Patient Name: Date of Service: [name] [facility] D. 05/15/2022 8:30 A M Medical Record Number: [id number] Patient Account Number: [id number] Date of Birth/Sex: Treating RN: 10-01-1940 (81 y.o. [name] Primary Care Provider: [name] Other Clinician: Referring Provider: Treating Provider/Extender: [name] in Treatment: 17 History of Present Illness HPI Description: 01-16-2022 upon evaluation today patient presents for initial inspection here in the clinic concerning issues that he has been having with wounds over the bilateral lower extremities and bilateral feet. Fortunately there does not appear to be any signs of significant infection at this point which is great news. Unfortunately his ABIs were registering at 0.49 on the left and 0.57 on the right here in the clinic on the screening today. With that being said I do think that it may possibly be true that we need to get him to formal arterial studies in order to see how things really are showing up. And the patient's not in disagreement with this for that reason we are going to make that referral as well. With that  being said he does have some significant past medical history items of note detailed below. Patient does have chronic kidney disease stage IIIb, coronary artery disease, long-term use of anticoagulant therapy, dilated cardiomyopathy. Proximal atrial fibrillation, he is on Xarelto long-term. He also has congestive heart failure, hypertension, peripheral neuropathy not related to diabetes as he is only prediabetic with a hemoglobin A1c of 5.8, and chronic venous hypertension. He has recently seen Dr. [name] show his cardiologist who did discontinue the hydrochlorothiazide and the Lasix and felt that the patient was retaining more fluid than he should be. For that reason he was started on furosemide 20 mg twice daily. 7/27; patient with bilateral lower extremity wounds. These are largely venous although he had very poor ABIs on screening test here. He has an appointment apparently tomorrow at vein and vascular for arterial studies. He was not too keen on the latter although I think I emphasized the need to do this both in terms of dealing with the current wounds and being prepared for further issues down the road. We have been using silver alginate on the wounds and Tubigrip's edema BASTIAN, ANDREOLI D ([id number]) [id number] Page 2 of 8 control is better per our intake nurse 02-06-2022 upon evaluation today patient appears to be doing well with regard to his wound. Fortunately there is no signs of infection he is actually healing for the most part on the bilateral feet. His legs are also doing much better though he has 1 new area that popped up on the right leg. Fortunately there is no evidence of active infection of note his ABIs and TBI's were poor on the vascular screening on the 31st with the left being worse than the right. For that reason I am going to recommend he still continue to follow-up with vascular to discuss angioplasty/stenting. [id number] upon  evaluation today  patient appears to be doing well currently in regard to his wound on the leg although he has several other blisters that are showing up at this point. I do believe that we need to try to do something to get his swelling under control this could be of utmost importance as far as getting this healed is concerned. 03-06-2022 upon evaluation today patient appears to be doing well currently in regard to his wounds. In fact the legs are drying up and seem to be healing quite nicely the lateral wounds are still weeping a little bit about the medial side actually appears to be pretty much dry and closed. The plantar aspect of his foot still is draining a bit but again it seems to be doing much better which is great news.[name] 03-13-2022 upon evaluation patient appears to be almost completely healed. His leg is doing excellent and his foot is just about closed. With that being said he did see vascular and they actually discussed the possibility of doing an arteriogram/angiogram in order to see if anything needed to be the balloons were stented open. With that being said he decided not to do that since things are healing so well right now and he is going to consider that for future they plan to see him back in November. 03-21-2022 upon evaluation today patient appears to be doing a little worse in regard to his wound on the foot. The wound keeps seeming to pull together unfortunately which is causing a trapped some fluid is just not healing quite as well as would like to see. With that being said I am going to try to remove a bit more of the overgrowth callus tissue today and hopefully that can help get this to seal up much more effectively and quickly. Fortunately I see no signs of infection of the foot although the leg on the right does seem to show some signs of erythema today I do think we need to address that currently. This looks to be cellulitis. 03-31-2022 upon evaluation today patient appears to be doing better  in regard to the cellulitis I am pleased in that regard he is still taking the antibiotics. Fortunately I do not see any evidence of active infection locally or systemically at this time which is great news. No fevers, chills, nausea, vomiting, or diarrhea. 04-10-2022 upon evaluation today patient appears to be doing well currently in regard to his foot ulcer I think were very close to complete resolution which is great news. Fortunately I do not see any signs of active infection locally or systemically at this time. No fevers, chills, nausea, vomiting, or diarrhea. 04-24-2022 upon evaluation today patient appears to be doing about the same in regard to his foot ulcer. Were not really seeing much in the way of improvements here. With that being said I do believe that he is tolerating the dressing changes without complication. No fevers, chills, nausea, vomiting, or diarrhea. With that being said the wound on his right plantar foot just does not seem to be making progress here. I do believe that he may benefit from vascular intervention which we previously discussed but he is previously wanted to hold off on. However at this point I think that this is becoming more crucial and he did ask me about getting in touch with the vascular doctor in order to see if they can get him scheduled sooner his next follow-up until like the end of December. I am more than happy  to do this as I think it would be definitely beneficial for him. 05-01-2022 upon evaluation patient's wound bed actually showed signs of good granulation and epithelization at this point all things considered. Still his ABIs are very low is about 50% on each leg. With that being said he does have a follow-up with vascular in about 2 weeks maybe 2-1/2 weeks where he is going to have the angiogram for the right leg and then subsequently the left leg will be to follow. Nonetheless I think this is can make a big difference in getting the wounds healed  to be honest. Fortunately I do not see any signs of active infection at this point. 11/9; patient arrives in clinic with 2 new superficial wounds on the right leg and right ankle. Both of these and the same condition of the original wound. In terms of the wound on his plantar foot about the same. We have been using collagen. His arterial review/angiogram is due for November 21 05-15-2022 upon evaluation today patient appears to be doing well currently in regard to his wounds all things considered. He actually has an appointment upcoming with the vascular surgeon where he is going to be undergoing intervention. Obviously I think this could be beneficial as far as helping this right leg and foot to heal it also allows to be able to put compression on which I think is much needed. He voiced understanding. That is on 21 November. Electronic Signature(s) Signed: 05/15/2022 9:07:16 AM By: Worthy Keeler PA-C Entered By: Worthy Keeler on 05/15/2022 09:07:15 -------------------------------------------------------------------------------- Physical Exam Details Patient Name: Date of Service: [name] [facility] D. 05/15/2022 8:30 A M Medical Record Number: [id number] Patient Account Number: [id number] Date of Birth/Sex: Treating RN: 08-Mar-1941 (81 y.o. [name] Primary Care Provider: [name] Other Clinician: Referring Provider: Treating Provider/Extender: [name] in Treatment: [address] [place], CHAGNON D ([id number]) [id number] Page 3 of 8 Well-nourished and well-hydrated in no acute distress. Respiratory normal breathing without difficulty. Psychiatric this patient is able to make decisions and demonstrates good insight into disease process. Alert and Oriented x 3. pleasant and cooperative. Notes Patient's wound bed actually showed signs of doing decently well across the board there is really not much change but again I think  once we establish sufficient blood flow that will be a much easier thing for [place] to heal especially in regard to the foot. Electronic Signature(s) Signed: 05/15/2022 9:08:17 AM By: Worthy Keeler PA-C Entered By: Worthy Keeler on 05/15/2022 09:08:17 -------------------------------------------------------------------------------- Physician Orders Details Patient Name: Date of Service: [name] [facility] D. 05/15/2022 8:30 A M Medical Record Number: [id number] Patient Account Number: [id number] Date of Birth/Sex: Treating RN: [birth year] (81 y.o. [name] Primary Care Provider: [name] Other Clinician: Referring Provider: Treating Provider/Extender: [name] in Treatment: 17 Verbal / Phone Orders: No Diagnosis Coding ICD-10 Coding Code Description [phone] Chronic venous hypertension (idiopathic) with ulcer and inflammation of bilateral lower extremity L97.812 Non-pressure chronic ulcer of other part of right lower leg with fat layer exposed L97.822 Non-pressure chronic ulcer of other part of left lower leg with fat layer exposed G90.09 Other idiopathic peripheral autonomic neuropathy L97.522 Non-pressure chronic ulcer of other part of left foot with fat layer exposed L97.512 Non-pressure chronic ulcer of other part of right foot with fat layer exposed I10 Essential (primary) hypertension I50.42 Chronic combined systolic (congestive) and diastolic (congestive) heart failure I48.0 Paroxysmal atrial fibrillation I42.0 Dilated  cardiomyopathy Z79.01 Long term (current) use of anticoagulants I25.10 Atherosclerotic heart disease of native coronary artery without angina pectoris N18.32 Chronic kidney disease, stage 3b Follow-up Appointments Return Appointment in 1 week. Nurse Visit as needed Bathing/ Shower/ Hygiene May shower with wound dressing protected with water repellent cover or cast protector. Edema Control - Lymphedema / Segmental Compressive  Device / Other Tubigrip double layer applied - tubi E double layer Elevate, Exercise Daily and A void Standing for Long Periods of Time. Elevate legs to the level of the heart and pump ankles as often as possible Elevate leg(s) parallel to the floor when sitting. Off-Loading Right Lower Extremity THI, SISEMORE ([id number]) [id number] Page 4 of 8 Open toe surgical shoe with peg assist. Wound Treatment Wound #2 - Foot Wound Laterality: Plantar, Right Cleanser: Wound Cleanser 3 x Per Week/30 Days Discharge Instructions: Wash your hands with soap and water. Remove old dressing, discard into plastic bag and place into trash. Cleanse the wound with Wound Cleanser prior to applying a clean dressing using gauze sponges, not tissues or cotton balls. Do not scrub or use excessive force. Pat dry using gauze sponges, not tissue or cotton balls. Prim Dressing: Prisma 4.34 (in) 3 x Per Week/30 Days ary Discharge Instructions: Moisten w/normal saline or sterile water; Cover wound as directed. Do not remove from wound bed. Secondary Dressing: Zetuvit Plus 4x8 (in/in) 3 x Per Week/30 Days Secured With: Tubigrip Size E, 3.5x10 (in/yds) 3 x Per Week/30 Days Discharge Instructions: Apply 3 Tubigrip E 3-finger-widths below knee to base of toes to secure dressing and/or for swelling. Wound #9 - Lower Leg Wound Laterality: Right, Lateral, Superior Cleanser: Wound Cleanser 3 x Per Week/30 Days Discharge Instructions: Wash your hands with soap and water. Remove old dressing, discard into plastic bag and place into trash. Cleanse the wound with Wound Cleanser prior to applying a clean dressing using gauze sponges, not tissues or cotton balls. Do not scrub or use excessive force. Pat dry using gauze sponges, not tissue or cotton balls. Secondary Dressing: ABD Pad 5x9 (in/in) 3 x Per Week/30 Days Discharge Instructions: Cover with ABD pad Secured With: Conform 4'' - Conforming Stretch  Gauze Bandage 4x75 (in/in) 3 x Per Week/30 Days Discharge Instructions: Apply as directed Secured With: Tubigrip Size E, 3.5x10 (in/yds) 3 x Per Week/30 Days Discharge Instructions: Apply 3 Tubigrip E 3-finger-widths below knee to base of toes to secure dressing and/or for swelling. Electronic Signature(s) Signed: 05/15/2022 4:56:08 PM By: Rosalio Loud MSN RN CNS WTA Signed: 05/16/2022 2:32:55 PM By: Worthy Keeler PA-C Entered By: Rosalio Loud on 05/15/2022 09:22:31 -------------------------------------------------------------------------------- Problem List Details Patient Name: Date of Service: [name] [facility] D. 05/15/2022 8:30 A M Medical Record Number: [id number] Patient Account Number: [id number] Date of Birth/Sex: Treating RN: 1941/03/09 (81 y.o. [name] Primary Care Provider: [name] Other Clinician: Referring Provider: Treating Provider/Extender: [name] in Treatment: 17 Active Problems ICD-10 Encounter Code Description Active Date MDM Diagnosis I87.333 Chronic venous hypertension (idiopathic) with ulcer and inflammation of 01/16/2022 No Yes bilateral lower extremity L97.812 Non-pressure chronic ulcer of other part of right lower leg with fat layer 01/16/2022 No Yes exposed Modesitt, Fay D ([id number]) [id number] Page 5 of 8 [phone] Non-pressure chronic ulcer of other part of left lower leg with fat layer exposed 01/16/2022 No Yes G90.09 Other idiopathic peripheral autonomic neuropathy 01/16/2022 No Yes L97.522 Non-pressure chronic ulcer of other part of left foot with fat layer exposed 01/16/2022 No Yes  J03.009 Non-pressure chronic ulcer of other part of right foot with fat layer exposed 01/16/2022 No Yes I10 Essential (primary) hypertension 01/16/2022 No Yes I50.42 Chronic combined systolic (congestive) and diastolic (congestive) heart failure 01/16/2022 No Yes I48.0 Paroxysmal atrial fibrillation 01/16/2022 No  Yes I42.0 Dilated cardiomyopathy 01/16/2022 No Yes Z79.01 Long term (current) use of anticoagulants 01/16/2022 No Yes I25.10 Atherosclerotic heart disease of native coronary artery without angina pectoris 01/16/2022 No Yes N18.32 Chronic kidney disease, stage 3b 01/16/2022 No Yes Inactive Problems Resolved Problems Electronic Signature(s) Signed: 05/15/2022 8:39:38 AM By: Worthy Keeler PA-C Entered By: Worthy Keeler on 05/15/2022 08:39:38 -------------------------------------------------------------------------------- Progress Note Details Patient Name: Date of Service: [name] [facility] D. 05/15/2022 8:30 A M Medical Record Number: [id number] Patient Account Number: [id number] Date of Birth/Sex: Treating RN: 22-Sep-1940 (81 y.o. [name] Primary Care Provider: [name] Other Clinician: Referring Provider: Treating Provider/Extender: [name] in Treatment: [address], [place] D ([id number]) [id number] Page 6 of 8 Subjective Chief Complaint Information obtained from Patient Multiple LE Ulcers History of Present Illness (HPI) 01-16-2022 upon evaluation today patient presents for initial inspection here in the clinic concerning issues that he has been having with wounds over the bilateral lower extremities and bilateral feet. Fortunately there does not appear to be any signs of significant infection at this point which is great news. Unfortunately his ABIs were registering at 0.49 on the left and 0.57 on the right here in the clinic on the screening today. With that being said I do think that it may possibly be true that we need to get him to formal arterial studies in order to see how things really are showing up. And the patient's not in disagreement with this for that reason we are going to make that referral as well. With that being said he does have some significant past medical history items of note detailed below. Patient  does have chronic kidney disease stage IIIb, coronary artery disease, long-term use of anticoagulant therapy, dilated cardiomyopathy. Proximal atrial fibrillation, he is on Xarelto long-term. He also has congestive heart failure, hypertension, peripheral neuropathy not related to diabetes as he is only prediabetic with a hemoglobin A1c of 5.8, and chronic venous hypertension. He has recently seen Dr. [name] show his cardiologist who did discontinue the hydrochlorothiazide and the Lasix and felt that the patient was retaining more fluid than he should be. For that reason he was started on furosemide 20 mg twice daily. 7/27; patient with bilateral lower extremity wounds. These are largely venous although he had very poor ABIs on screening test here. He has an appointment apparently tomorrow at vein and vascular for arterial studies. He was not too keen on the latter although I think I emphasized the need to do this both in terms of dealing with the current wounds and being prepared for further issues down the road. We have been using silver alginate on the wounds and Tubigrip's edema control is better per our intake nurse 02-06-2022 upon evaluation today patient appears to be doing well with regard to his wound. Fortunately there is no signs of infection he is actually healing for the most part on the bilateral feet. His legs are also doing much better though he has 1 new area that popped up on the right leg. Fortunately there is no evidence of active infection of note his ABIs and TBI's were poor on the vascular screening on the 31st with the left being worse than the right.  For that reason I am going to recommend he still continue to follow-up with vascular to discuss angioplasty/stenting. 8-124-23 upon evaluation today patient appears to be doing well currently in regard to his wound on the leg although he has several other blisters that are showing up at this point. I do believe that we need to try to  do something to get his swelling under control this could be of utmost importance as far as getting this healed is concerned. 03-06-2022 upon evaluation today patient appears to be doing well currently in regard to his wounds. In fact the legs are drying up and seem to be healing quite nicely the lateral wounds are still weeping a little bit about the medial side actually appears to be pretty much dry and closed. The plantar aspect of his foot still is draining a bit but again it seems to be doing much better which is great news.[name] 03-13-2022 upon evaluation patient appears to be almost completely healed. His leg is doing excellent and his foot is just about closed. With that being said he did see vascular and they actually discussed the possibility of doing an arteriogram/angiogram in order to see if anything needed to be the balloons were stented open. With that being said he decided not to do that since things are healing so well right now and he is going to consider that for future they plan to see him back in November. 03-21-2022 upon evaluation today patient appears to be doing a little worse in regard to his wound on the foot. The wound keeps seeming to pull together unfortunately which is causing a trapped some fluid is just not healing quite as well as would like to see. With that being said I am going to try to remove a bit more of the overgrowth callus tissue today and hopefully that can help get this to seal up much more effectively and quickly. Fortunately I see no signs of infection of the foot although the leg on the right does seem to show some signs of erythema today I do think we need to address that currently. This looks to be cellulitis. 03-31-2022 upon evaluation today patient appears to be doing better in regard to the cellulitis I am pleased in that regard he is still taking the antibiotics. Fortunately I do not see any evidence of active infection locally or systemically at this time  which is great news. No fevers, chills, nausea, vomiting, or diarrhea. 04-10-2022 upon evaluation today patient appears to be doing well currently in regard to his foot ulcer I think were very close to complete resolution which is great news. Fortunately I do not see any signs of active infection locally or systemically at this time. No fevers, chills, nausea, vomiting, or diarrhea. 04-24-2022 upon evaluation today patient appears to be doing about the same in regard to his foot ulcer. Were not really seeing much in the way of improvements here. With that being said I do believe that he is tolerating the dressing changes without complication. No fevers, chills, nausea, vomiting, or diarrhea. With that being said the wound on his right plantar foot just does not seem to be making progress here. I do believe that he may benefit from vascular intervention which we previously discussed but he is previously wanted to hold off on. However at this point I think that this is becoming more crucial and he did ask me about getting in touch with the vascular doctor in order to see if  they can get him scheduled sooner his next follow-up until like the end of December. I am more than happy to do this as I think it would be definitely beneficial for him. 05-01-2022 upon evaluation patient's wound bed actually showed signs of good granulation and epithelization at this point all things considered. Still his ABIs are very low is about 50% on each leg. With that being said he does have a follow-up with vascular in about 2 weeks maybe 2-1/2 weeks where he is going to have the angiogram for the right leg and then subsequently the left leg will be to follow. Nonetheless I think this is can make a big difference in getting the wounds healed to be honest. Fortunately I do not see any signs of active infection at this point. 11/9; patient arrives in clinic with 2 new superficial wounds on the right leg and right ankle. Both  of these and the same condition of the original wound. In terms of the wound on his plantar foot about the same. We have been using collagen. His arterial review/angiogram is due for November 21 05-15-2022 upon evaluation today patient appears to be doing well currently in regard to his wounds all things considered. He actually has an appointment upcoming with the vascular surgeon where he is going to be undergoing intervention. Obviously I think this could be beneficial as far as helping this right leg and foot to heal it also allows to be able to put compression on which I think is much needed. He voiced understanding. That is on 21 November. AERON, DONAGHEY ([id number]) [id number] Page 7 of 8 Objective Constitutional Well-nourished and well-hydrated in no acute distress. Vitals Time Taken: 8:38 AM, Height: 74 in, Weight: 244 lbs, BMI: 31.3, Temperature: 98.1 F, Pulse: 75 bpm, Respiratory Rate: 16 breaths/min, Blood Pressure: 130/68 mmHg. Respiratory normal breathing without difficulty. Psychiatric this patient is able to make decisions and demonstrates good insight into disease process. Alert and Oriented x 3. pleasant and cooperative. General Notes: Patient's wound bed actually showed signs of doing decently well across the board there is really not much change but again I think once we establish sufficient blood flow that will be a much easier thing for [place] to heal especially in regard to the foot. Integumentary (Hair, Skin) Wound #10 status is Open. Original cause of wound was Gradually Appeared. The date acquired was: 05/03/2022. The wound has been in treatment 1 weeks. The wound is located on the Right,Distal,Lateral Lower Leg. The wound measures 0.7cm length x 0.7cm width x 0.1cm depth; 0.385cm^2 area and 0.038cm^3 volume. There is Fat Layer (Subcutaneous Tissue) exposed. There is a medium amount of serous drainage noted. There is medium (34-66%) red,  friable granulation within the wound bed. Wound #2 status is Open. Original cause of wound was Gradually Appeared. The date acquired was: 12/28/2020. The wound has been in treatment 17 weeks. The wound is located on the [place]. The wound measures 0.5cm length x 0.2cm width x 0.2cm depth; 0.079cm^2 area and 0.016cm^3 volume. There is Fat Layer (Subcutaneous Tissue) exposed. There is a medium amount of serosanguineous drainage noted. There is small (1-33%) red granulation within the wound bed. There is no necrotic tissue within the wound bed. Wound #9 status is Open. Original cause of wound was Skin T ear/Laceration. The date acquired was: 04/09/2022. The wound has been in treatment 5 weeks. The wound is located on the Right,Lateral,Superior Lower Leg. The wound measures 1cm length x 0.7cm width x 0.1cm  depth; 0.55cm^2 area and 0.055cm^3 volume. There is Fat Layer (Subcutaneous Tissue) exposed. There is a medium amount of serosanguineous drainage noted. The wound margin is flat and intact. There is no granulation within the wound bed. There is no necrotic tissue within the wound bed. Assessment Active Problems ICD-10 Chronic venous hypertension (idiopathic) with ulcer and inflammation of bilateral lower extremity Non-pressure chronic ulcer of other part of right lower leg with fat layer exposed Non-pressure chronic ulcer of other part of left lower leg with fat layer exposed Other idiopathic peripheral autonomic neuropathy Non-pressure chronic ulcer of other part of left foot with fat layer exposed Non-pressure chronic ulcer of other part of right foot with fat layer exposed Essential (primary) hypertension Chronic combined systolic (congestive) and diastolic (congestive) heart failure Paroxysmal atrial fibrillation Dilated cardiomyopathy Long term (current) use of anticoagulants Atherosclerotic heart disease of native coronary artery without angina pectoris Chronic kidney disease,  stage 3b Plan 1. I am good recommend that we have the patient going continue to monitor for any signs of worsening or infection. Based on what I am seeing right now I do believe that he really needs the vascular intervention that can be the biggest thing. 2. I am also can recommend that he continue for the time being with the dressings as before. We have been utilizing the silver collagen followed by ABD pads I think that still appropriate to use and we are using the Tubigrip size E although I think a compression wrap would be better once we get everything completed as far as vascular intervention is concerned. We will see patient back for reevaluation in 1 week here in the clinic. If anything worsens or changes patient will contact our office for additional recommendations. Electronic Signature(s) Signed: 05/15/2022 9:09:05 AM By: Worthy Keeler PA-C Entered By: Worthy Keeler on 05/15/2022 09:09:05 [name] ([id number]) [id number] Page 8 of 8 -------------------------------------------------------------------------------- SuperBill Details Patient Name: Date of Service: [name] [facility] D. 05/15/2022 Medical Record Number: [id number] Patient Account Number: [id number] Date of Birth/Sex: Treating RN: [birth year] (81 y.o. [name] Primary Care Provider: [name] Other Clinician: Referring Provider: Treating Provider/Extender: [name] in Treatment: 17 Diagnosis Coding ICD-10 Codes Code Description [phone] Chronic venous hypertension (idiopathic) with ulcer and inflammation of bilateral lower extremity L97.812 Non-pressure chronic ulcer of other part of right lower leg with fat layer exposed L97.822 Non-pressure chronic ulcer of other part of left lower leg with fat layer exposed G90.09 Other idiopathic peripheral autonomic neuropathy L97.522 Non-pressure chronic ulcer of other part of left foot with fat layer  exposed L97.512 Non-pressure chronic ulcer of other part of right foot with fat layer exposed I10 Essential (primary) hypertension I50.42 Chronic combined systolic (congestive) and diastolic (congestive) heart failure I48.0 Paroxysmal atrial fibrillation I42.0 Dilated cardiomyopathy Z79.01 Long term (current) use of anticoagulants I25.10 Atherosclerotic heart disease of native coronary artery without angina pectoris N18.32 Chronic kidney disease, stage 3b Physician Procedures : CPT4 Code Description Modifier [id number] - WC PHYS LEVEL 3 - EST PT ICD-10 Diagnosis Description I87.333 Chronic venous hypertension (idiopathic) with ulcer and inflammation of bilateral lower extremity L97.812 Non-pressure chronic ulcer of other  part of right lower leg with fat layer exposed L97.822 Non-pressure chronic ulcer of other part of left lower leg with fat layer exposed G90.09 Other idiopathic peripheral autonomic neuropathy Quantity: 1 Electronic Signature(s) Signed: 05/15/2022 4:56:08 PM By: Rosalio Loud MSN RN CNS WTA Signed: 05/16/2022 2:32:55 PM By: Worthy Keeler  PA-C Previous Signature: 05/15/2022 9:09:22 AM Version By: Worthy Keeler PA-C Entered By: Rosalio Loud on 05/15/2022 09:23:57

## 2022-05-16 NOTE — Progress Notes (Signed)
KARMELLO, ABERCROMBIE ([id number]) [id number] Page 1 of 12 Visit Report for 05/15/2022 Arrival Information Details Patient Name: Date of Service: [name] [facility] [name]. 05/15/2022 8:30 A M Medical Record Number: [id number] Patient Account Number: [id number] Date of Birth/Sex: Treating RN: 1941-03-06 (81 y.o. [name] Primary Care Alanni Vader: [name] Other Clinician: Referring Cadence Haslam: Treating Santhosh Gulino/Extender: [name] in Treatment: 17 Visit Information History Since Last Visit Added or deleted any medications: No Patient Arrived: [name] Any new allergies or adverse reactions: No Arrival Time: 08:30 Had a fall or experienced change in No Accompanied By: self activities of daily living that may affect Transfer Assistance: None risk of falls: Patient Requires Transmission-Based Precautions: No Hospitalized since last visit: No Patient Has Alerts: Yes Pain Present Now: No Patient Alerts: ABI R 0.69 L 0.66 TBI R 0.41 L 0.16 Electronic Signature(s) Signed: 05/15/2022 4:56:08 PM By: Rosalio Loud MSN RN CNS WTA Entered By: Rosalio Loud on 05/15/2022 08:37:34 -------------------------------------------------------------------------------- Clinic Level of Care Assessment Details Patient Name: Date of Service: [name] [facility] [name]. 05/15/2022 8:30 A M Medical Record Number: [id number] Patient Account Number: [id number] Date of Birth/Sex: Treating RN: [birth year] (81 y.o. [name] Primary Care Carliss Quast: [name] Other Clinician: Referring Arval Brandstetter: Treating Eyad Rochford/Extender: [name] in Treatment: 17 Clinic Level of Care Assessment Items TOOL 4 Quantity Score X- 1 0 Use when only an EandM is performed on FOLLOW-UP visit ASSESSMENTS - Nursing Assessment / Reassessment X- 1 10 Reassessment of Co-morbidities (includes updates in patient status) X- 1 5 Reassessment of Adherence to Treatment  Plan ASSESSMENTS - Wound and Skin A ssessment / Reassessment []  - 0 Simple Wound Assessment / Reassessment - one wound X- 3 5 Complex Wound Assessment / Reassessment - multiple wounds [name], [name] ([id number]) [id number] Page 2 of 12 []  - 0 Dermatologic / Skin Assessment (not related to wound area) ASSESSMENTS - Focused Assessment X- 1 5 Circumferential Edema Measurements - multi extremities []  - 0 Nutritional Assessment / Counseling / Intervention []  - 0 Lower Extremity Assessment (monofilament, tuning fork, pulses) []  - 0 Peripheral Arterial Disease Assessment (using hand held doppler) ASSESSMENTS - Ostomy and/or Continence Assessment and Care []  - 0 Incontinence Assessment and Management []  - 0 Ostomy Care Assessment and Management (repouching, etc.) PROCESS - Coordination of Care []  - 0 Simple Patient / Family Education for ongoing care []  - 0 Complex (extensive) Patient / Family Education for ongoing care X- 1 10 Staff obtains Programmer, systems, Records, T Results / Process Orders est []  - 0 Staff telephones HHA, Nursing Homes / Clarify orders / etc []  - 0 Routine Transfer to another Facility (non-emergent condition) []  - 0 Routine Hospital Admission (non-emergent condition) []  - 0 New Admissions / Biomedical engineer / Ordering NPWT Apligraf, etc. , []  - 0 Emergency Hospital Admission (emergent condition) []  - 0 Simple Discharge Coordination X- 1 15 Complex (extensive) Discharge Coordination PROCESS - Special Needs []  - 0 Pediatric / Minor Patient Management []  - 0 Isolation Patient Management []  - 0 Hearing / Language / Visual special needs []  - 0 Assessment of Community assistance (transportation, [name]/C planning, etc.) []  - 0 Additional assistance / Altered mentation []  - 0 Support Surface(s) Assessment (bed, cushion, seat, etc.) INTERVENTIONS - Wound Cleansing / Measurement []  - 0 Simple Wound Cleansing - one wound X- 3  5 Complex Wound Cleansing - multiple wounds []  - 0 Wound Imaging (photographs - any number of wounds) []  - 0 Wound  Tracing (instead of photographs) []  - 0 Simple Wound Measurement - one wound X- 1 5 Complex Wound Measurement - multiple wounds INTERVENTIONS - Wound Dressings []  - 0 Small Wound Dressing one or multiple wounds X- 1 15 Medium Wound Dressing one or multiple wounds []  - 0 Large Wound Dressing one or multiple wounds []  - 0 Application of Medications - topical []  - 0 Application of Medications - injection INTERVENTIONS - Miscellaneous []  - 0 External ear exam []  - 0 Specimen Collection (cultures, biopsies, blood, body fluids, etc.) []  - 0 Specimen(s) / Culture(s) sent or taken to Lab for analysis []  - 0 Patient Transfer (multiple staff / Stormy Fabian / Similar devices) [name], [name] ([id number]) [id number] Page 3 of 12 []  - 0 Simple Staple / Suture removal (25 or less) []  - 0 Complex Staple / Suture removal (26 or more) []  - 0 Hypo / Hyperglycemic Management (close monitor of Blood Glucose) []  - 0 Ankle / Brachial Index (ABI) - do not check if billed separately X- 1 5 Vital Signs Has the patient been seen at the hospital within the last three years: Yes Total Score: 100 Level Of Care: New/Established - Level 3 Electronic Signature(s) Signed: 05/15/2022 4:56:08 PM By: Rosalio Loud MSN RN CNS WTA Entered By: Rosalio Loud on 05/15/2022 09:23:48 -------------------------------------------------------------------------------- Encounter Discharge Information Details Patient Name: Date of Service: [name], [name]. 05/15/2022 8:30 A M Medical Record Number: [id number] Patient Account Number: [id number] Date of Birth/Sex: Treating RN: 1940/11/11 (81 y.o. [name] Primary Care Fleeta Kunde: [name] Other Clinician: Referring Tawfiq Favila: Treating Jaquil Todt/Extender: [name] in Treatment: 17 Encounter  Discharge Information Items Discharge Condition: Stable Ambulatory Status: Walker Discharge Destination: Home Transportation: Private Auto Accompanied By: self Schedule Follow-up Appointment: Yes Clinical Summary of Care: Electronic Signature(s) Signed: 05/15/2022 4:56:08 PM By: Rosalio Loud MSN RN CNS WTA Entered By: Rosalio Loud on 05/15/2022 09:24:55 -------------------------------------------------------------------------------- Lower Extremity Assessment Details Patient Name: Date of Service: [name] [facility] [name]. 05/15/2022 8:30 A M Medical Record Number: [id number] Patient Account Number: [id number] Date of Birth/Sex: Treating RN: January 02, 1941 (81 y.o. [name] Primary Care Amma Crear: [name] Other Clinician: Referring Alyssandra Hulsebus: Treating Goldman Birchall/Extender: [name] in Treatment: [address] [place] ([id number]) [id number] Page 4 of 12 Edema Assessment Assessed: [Left: No] [Right: No] [Left: Edema] [Right: :] Calf Left: Right: Point of Measurement: 31 cm From Medial Instep 39 cm Ankle Left: Right: Point of Measurement: 11 cm From Medial Instep 27 cm Vascular Assessment Pulses: Dorsalis Pedis Palpable: [Right:Yes] Electronic Signature(s) Signed: 05/15/2022 4:56:08 PM By: Rosalio Loud MSN RN CNS WTA Entered By: Rosalio Loud on 05/15/2022 08:50:33 -------------------------------------------------------------------------------- Multi Wound Chart Details Patient Name: Date of Service: [name], [name]. 05/15/2022 8:30 A M Medical Record Number: [id number] Patient Account Number: [id number] Date of Birth/Sex: Treating RN: 1941/02/20 (81 y.o. [name] Primary Care Jamey Harman: [name] Other Clinician: Referring Ethelle Ola: Treating Chester Romero/Extender: [name] in Treatment: 17 Vital Signs Height(in): 81 Pulse(bpm): 46 Weight(lbs): 244 Blood Pressure(mmHg): 130/68 Body Mass  Index(BMI): 31.3 Temperature(F): 98.1 Respiratory Rate(breaths/min): 16 [10:Photos:] Right, Distal, Lateral Lower Leg Right, Plantar Foot Right, Lateral, Superior Lower Leg Wound Location: Gradually Appeared Gradually Appeared Skin Tear/Laceration Wounding Event: Abrasion Neuropathic Ulcer-Non Diabetic Skin Tear Primary Etiology: Congestive Heart Failure, Congestive Heart Failure, Congestive Heart Failure, Comorbid History: Hypertension Hypertension Hypertension 05/03/2022 12/28/2020 04/09/2022 Date Acquired: 1 17 5  Weeks of Treatment: Open Open Open Wound Status:  [name], [name] ([id number]) [id number] Page 5 of 12 No No No Wound Recurrence: 0.7x0.7x0.1 0.5x0.2x0.2 1x0.7x0.1 Measurements L x W x [name] (cm) 0.385 0.079 0.55 A (cm) : rea 0.038 0.016 0.055 Volume (cm) : 83.70% 0.00% -7.60% % Reduction in Area: 83.90% 33.30% -7.80% % Reduction in Volume: Partial Thickness Full Thickness Without Exposed Partial Thickness Classification: Support Structures Medium Medium Medium Exudate Amount: Serous Serosanguineous Serosanguineous Exudate Type: amber red, brown red, brown Exudate Color: N/A N/A Flat and Intact Wound Margin: Medium (34-66%) Small (1-33%) None Present (0%) Granulation Amount: Red, Friable Red N/A Granulation Quality: N/A None Present (0%) None Present (0%) Necrotic Amount: Fat Layer (Subcutaneous Tissue): Yes Fat Layer (Subcutaneous Tissue): Yes Fat Layer (Subcutaneous Tissue): Yes Exposed Structures: Fascia: No Fascia: No Fascia: No Tendon: No Tendon: No Tendon: No Muscle: No Muscle: No Muscle: No Joint: No Joint: No Joint: No Bone: No Bone: No Bone: No N/A Medium (34-66%) None Epithelialization: Treatment Notes Electronic Signature(s) Signed: 05/15/2022 4:56:08 PM By: Rosalio Loud MSN RN CNS WTA Entered By: Rosalio Loud on 05/15/2022  09:22:11 -------------------------------------------------------------------------------- Multi-Disciplinary Care Plan Details Patient Name: Date of Service: [name], [name]. 05/15/2022 8:30 A M Medical Record Number: [id number] Patient Account Number: [id number] Date of Birth/Sex: Treating RN: 1940/10/19 (81 y.o. [name] Primary Care Jaslin Novitski: [name] Other Clinician: Referring Jullisa Grigoryan: Treating Lakendria Nicastro/Extender: [name] in Treatment: 17 Active Inactive Necrotic Tissue Nursing Diagnoses: Impaired tissue integrity related to necrotic/devitalized tissue Knowledge deficit related to management of necrotic/devitalized tissue Goals: Necrotic/devitalized tissue will be minimized in the wound bed Date Initiated: 03/31/2022 Target Resolution Date: 03/31/2022 Goal Status: Active Patient/caregiver will verbalize understanding of reason and process for debridement of necrotic tissue Date Initiated: 03/31/2022 Target Resolution Date: 03/31/2022 Goal Status: Active Interventions: Assess patient pain level pre-, during and post procedure and prior to discharge Provide education on necrotic tissue and debridement process Treatment Activities: Excisional debridement : 03/31/2022 Notes: MARIAN, GRANDT [name] ([id number]) [id number] Page 6 of 12 Wound/Skin Impairment Nursing Diagnoses: Knowledge deficit related to ulceration/compromised skin integrity Goals: Patient/caregiver will verbalize understanding of skin care regimen Date Initiated: 01/16/2022 Target Resolution Date: 02/16/2022 Goal Status: Active Ulcer/skin breakdown will have a volume reduction of 30% by week 4 Date Initiated: 01/16/2022 Target Resolution Date: 02/16/2022 Goal Status: Active Ulcer/skin breakdown will have a volume reduction of 50% by week 8 Date Initiated: 01/16/2022 Target Resolution Date: 03/19/2022 Goal Status: Active Ulcer/skin breakdown will have a  volume reduction of 80% by week 12 Date Initiated: 01/16/2022 Target Resolution Date: 04/18/2022 Goal Status: Active Ulcer/skin breakdown will heal within 14 weeks Date Initiated: 01/16/2022 Target Resolution Date: 05/19/2022 Goal Status: Active Interventions: Assess patient/caregiver ability to obtain necessary supplies Assess patient/caregiver ability to perform ulcer/skin care regimen upon admission and as needed Assess ulceration(s) every visit Notes: Electronic Signature(s) Signed: 05/15/2022 4:56:08 PM By: Rosalio Loud MSN RN CNS WTA Entered By: Rosalio Loud on 05/15/2022 08:50:37 -------------------------------------------------------------------------------- Pain Assessment Details Patient Name: Date of Service: [name] [facility] [name]. 05/15/2022 8:30 A M Medical Record Number: [id number] Patient Account Number: [id number] Date of Birth/Sex: Treating RN: 15-Aug-1940 (81 y.o. [name] Primary Care Chandlor Noecker: [name] Other Clinician: Referring Lurena Naeve: Treating Harley Fitzwater/Extender: [name] in Treatment: 17 Active Problems Location of Pain Severity and Description of Pain Patient Has Paino No Site Locations [name], [name] ([id number]) [phone].pdf Page 7 of 12 Pain Management and Medication Current Pain Management: Electronic Signature(s) Signed: 05/15/2022 4:56:08 PM By: Tamala Julian,  Jocelyn Lamer MSN RN CNS WTA Entered By: Rosalio Loud on 05/15/2022 08:38:37 -------------------------------------------------------------------------------- Patient/Caregiver Education Details Patient Name: Date of Service: [name] [facility] [name]. 11/16/2023andnbsp8:30 A M Medical Record Number: [id number] Patient Account Number: [id number] Date of Birth/Gender: Treating RN: 09/13/1940 (81 y.o. [name] Primary Care Physician: [name] Other Clinician: Referring Physician: Treating Physician/Extender: [name] in  Treatment: 17 Education Assessment Education Provided To: Patient Education Topics Provided Wound/Skin Impairment: Handouts: Caring for Your Ulcer Methods: Explain/Verbal Responses: State content correctly Electronic Signature(s) Signed: 05/15/2022 4:56:08 PM By: Rosalio Loud MSN RN CNS WTA Entered By: Rosalio Loud on 05/15/2022 09:24:10 [name] ([id number]) [id number] Page 8 of 12 -------------------------------------------------------------------------------- Wound Assessment Details Patient Name: Date of Service: [name] [facility] [name]. 05/15/2022 8:30 A M Medical Record Number: [id number] Patient Account Number: [id number] Date of Birth/Sex: Treating RN: January 24, 1941 (81 y.o. [name] Primary Care Maximilien Hayashi: [name] Other Clinician: Referring Kamarie Palma: Treating Marciel Offenberger/Extender: [name] in Treatment: 17 Wound Status Wound Number: 10 Primary Etiology: Abrasion Wound Location: Right, Distal, Lateral Lower Leg Wound Status: Open Wounding Event: Gradually Appeared Comorbid History: Congestive Heart Failure, Hypertension Date Acquired: 05/03/2022 Weeks Of Treatment: 1 Clustered Wound: No Photos Wound Measurements Length: (cm) 0.7 Width: (cm) 0.7 Depth: (cm) 0.1 Area: (cm) 0.385 Volume: (cm) 0.038 % Reduction in Area: 83.7% % Reduction in Volume: 83.9% Wound Description Classification: Partial Thickness Exudate Amount: Medium Exudate Type: Serous Exudate Color: amber Foul Odor After Cleansing: No Slough/Fibrino No Wound Bed Granulation Amount: Medium (34-66%) Exposed Structure Granulation Quality: Red, Friable Fascia Exposed: No Fat Layer (Subcutaneous Tissue) Exposed: Yes Tendon Exposed: No Muscle Exposed: No Joint Exposed: No Bone Exposed: No Treatment Notes Wound #10 (Lower Leg) Wound Laterality: Right, Lateral, Distal Cleanser Peri-Wound Care Topical [name], [name] ([id number])  [id number] Page 9 of 12 Primary Dressing Secondary Dressing Secured With Compression Wrap Compression Stockings Add-Ons Electronic Signature(s) Signed: 05/15/2022 4:56:08 PM By: Rosalio Loud MSN RN CNS WTA Entered By: Rosalio Loud on 05/15/2022 08:47:56 -------------------------------------------------------------------------------- Wound Assessment Details Patient Name: Date of Service: [name] [facility] [name]. 05/15/2022 8:30 A M Medical Record Number: [id number] Patient Account Number: [id number] Date of Birth/Sex: Treating RN: July 14, 1940 (81 y.o. [name] Primary Care Makayah Pauli: [name] Other Clinician: Referring Leitha Hyppolite: Treating Michaeline Eckersley/Extender: [name] in Treatment: 17 Wound Status Wound Number: 2 Primary Etiology: Neuropathic Ulcer-Non Diabetic Wound Location: Right, Plantar Foot Wound Status: Open Wounding Event: Gradually Appeared Comorbid History: Congestive Heart Failure, Hypertension Date Acquired: 12/28/2020 Weeks Of Treatment: 17 Clustered Wound: No Photos Wound Measurements Length: (cm) 0.5 Width: (cm) 0.2 Depth: (cm) 0.2 Area: (cm) 0.079 Volume: (cm) 0.016 % Reduction in Area: 0% % Reduction in Volume: 33.3% Epithelialization: Medium (34-66%) Wound Description Classification: Full Thickness Without Exposed Support Structures Exudate Amount: Medium Exudate Type: Serosanguineous Exudate Color: red, brown Foul Odor After Cleansing: No Slough/Fibrino No Wound Bed Granulation Amount: Small (1-33%) Exposed Structure [name], [name] ([id number]) [id number] Page 10 of 12 Granulation Quality: Red Fascia Exposed: No Necrotic Amount: None Present (0%) Fat Layer (Subcutaneous Tissue) Exposed: Yes Tendon Exposed: No Muscle Exposed: No Joint Exposed: No Bone Exposed: No Treatment Notes Wound #2 (Foot) Wound Laterality: Plantar, Right Cleanser Wound  Cleanser Discharge Instruction: Wash your hands with soap and water. Remove old dressing, discard into plastic bag and place into trash. Cleanse the wound with Wound Cleanser prior to applying a clean dressing using gauze sponges, not tissues or cotton  balls. Do not scrub or use excessive force. Pat dry using gauze sponges, not tissue or cotton balls. Peri-Wound Care Topical Primary Dressing Prisma 4.34 (in) Discharge Instruction: Moisten w/normal saline or sterile water; Cover wound as directed. Do not remove from wound bed. Secondary Dressing Zetuvit Plus 4x8 (in/in) Secured With Tubigrip Size E, 3.5x10 (in/yds) Discharge Instruction: Apply 3 Tubigrip E 3-finger-widths below knee to base of toes to secure dressing and/or for swelling. Compression Wrap Compression Stockings Add-Ons Electronic Signature(s) Signed: 05/15/2022 4:56:08 PM By: Rosalio Loud MSN RN CNS WTA Entered By: Rosalio Loud on 05/15/2022 08:48:27 -------------------------------------------------------------------------------- Wound Assessment Details Patient Name: Date of Service: [name] [facility] [name]. 05/15/2022 8:30 A M Medical Record Number: [id number] Patient Account Number: [id number] Date of Birth/Sex: Treating RN: 1940-09-13 (81 y.o. [name] Primary Care Dilon Lank: [name] Other Clinician: Referring Cassara Nida: Treating Starlett Pehrson/Extender: [name] Weeks in Treatment: 17 Wound Status Wound Number: 9 Primary Etiology: Skin Tear Wound Location: Right, Lateral, Superior Lower Leg Wound Status: Open Wounding Event: Skin Tear/Laceration Comorbid History: Congestive Heart Failure, Hypertension Date Acquired: 04/09/2022 Weeks Of Treatment: 5 Clustered Wound: No Photos [name], [name] ([id number]) [id number] Page 11 of 12 Wound Measurements Length: (cm) 1 Width: (cm) 0.7 Depth: (cm) 0.1 Area: (cm) 0.55 Volume: (cm) 0.055 % Reduction in Area:  -7.6% % Reduction in Volume: -7.8% Epithelialization: None Wound Description Classification: Partial Thickness Wound Margin: Flat and Intact Exudate Amount: Medium Exudate Type: Serosanguineous Exudate Color: red, brown Foul Odor After Cleansing: No Slough/Fibrino No Wound Bed Granulation Amount: None Present (0%) Exposed Structure Necrotic Amount: None Present (0%) Fascia Exposed: No Fat Layer (Subcutaneous Tissue) Exposed: Yes Tendon Exposed: No Muscle Exposed: No Joint Exposed: No Bone Exposed: No Treatment Notes Wound #9 (Lower Leg) Wound Laterality: Right, Lateral, Superior Cleanser Wound Cleanser Discharge Instruction: Wash your hands with soap and water. Remove old dressing, discard into plastic bag and place into trash. Cleanse the wound with Wound Cleanser prior to applying a clean dressing using gauze sponges, not tissues or cotton balls. Do not scrub or use excessive force. Pat dry using gauze sponges, not tissue or cotton balls. Peri-Wound Care Topical Primary Dressing Secondary Dressing ABD Pad 5x9 (in/in) Discharge Instruction: Cover with ABD pad Secured With Conform 4'' - Conforming Stretch Gauze Bandage 4x75 (in/in) Discharge Instruction: Apply as directed Tubigrip Size E, 3.5x10 (in/yds) Discharge Instruction: Apply 3 Tubigrip E 3-finger-widths below knee to base of toes to secure dressing and/or for swelling. Compression Wrap Compression Stockings Add-Ons Electronic Signature(s) Signed: 05/15/2022 4:56:08 PM By: Rosalio Loud MSN RN CNS WTA Entered By: Rosalio Loud on 05/15/2022 08:48:56 Trainer, Deidre Ala [name] ([id number]) [id number] Page 12 of 12 -------------------------------------------------------------------------------- Vitals Details Patient Name: Date of Service: [name] [facility] [name]. 05/15/2022 8:30 A M Medical Record Number: [id number] Patient Account Number: [id number] Date of Birth/Sex: Treating RN: 1940/09/24 (81 y.o.  [name] Primary Care Devondre Guzzetta: [name] Other Clinician: Referring Taleeya Blondin: Treating Samary Shatz/Extender: [name] in Treatment: 17 Vital Signs Time Taken: 08:38 Temperature (F): 98.1 Height (in): 74 Pulse (bpm): 75 Weight (lbs): 244 Respiratory Rate (breaths/min): 16 Body Mass Index (BMI): 31.3 Blood Pressure (mmHg): 130/68 Reference Range: 80 - 120 mg / dl Electronic Signature(s) Signed: 05/15/2022 4:56:08 PM By: Rosalio Loud MSN RN CNS WTA Entered By: Rosalio Loud on 05/15/2022 08:38:31

## 2022-05-20 ENCOUNTER — Encounter: Payer: Self-pay | Admitting: Vascular Surgery

## 2022-05-20 ENCOUNTER — Encounter
Admission: RE | Disposition: A | Discharge status: Home / Self Care | Payer: Self-pay | Source: Home / Self Care | Attending: Vascular Surgery

## 2022-05-20 ENCOUNTER — Ambulatory Visit
Admission: RE | Admit: 2022-05-20 | Discharge: 2022-05-20 | Disposition: A | Discharge status: Home / Self Care | Payer: PPO | Attending: Vascular Surgery | Admitting: Vascular Surgery

## 2022-05-20 DIAGNOSIS — L97919 Non-pressure chronic ulcer of unspecified part of right lower leg with unspecified severity: Secondary | ICD-10-CM | POA: Diagnosis not present

## 2022-05-20 DIAGNOSIS — I70239 Atherosclerosis of native arteries of right leg with ulceration of unspecified site: Secondary | ICD-10-CM

## 2022-05-20 DIAGNOSIS — Z7901 Long term (current) use of anticoagulants: Secondary | ICD-10-CM | POA: Insufficient documentation

## 2022-05-20 DIAGNOSIS — E785 Hyperlipidemia, unspecified: Secondary | ICD-10-CM | POA: Diagnosis not present

## 2022-05-20 DIAGNOSIS — I700 Atherosclerosis of aorta: Secondary | ICD-10-CM

## 2022-05-20 DIAGNOSIS — I10 Essential (primary) hypertension: Secondary | ICD-10-CM | POA: Insufficient documentation

## 2022-05-20 DIAGNOSIS — I70235 Atherosclerosis of native arteries of right leg with ulceration of other part of foot: Secondary | ICD-10-CM | POA: Diagnosis not present

## 2022-05-20 DIAGNOSIS — Z87891 Personal history of nicotine dependence: Secondary | ICD-10-CM | POA: Diagnosis not present

## 2022-05-20 DIAGNOSIS — I70202 Unspecified atherosclerosis of native arteries of extremities, left leg: Secondary | ICD-10-CM

## 2022-05-20 DIAGNOSIS — I4891 Unspecified atrial fibrillation: Secondary | ICD-10-CM | POA: Insufficient documentation

## 2022-05-20 DIAGNOSIS — L97909 Non-pressure chronic ulcer of unspecified part of unspecified lower leg with unspecified severity: Secondary | ICD-10-CM

## 2022-05-20 DIAGNOSIS — L97519 Non-pressure chronic ulcer of other part of right foot with unspecified severity: Secondary | ICD-10-CM | POA: Insufficient documentation

## 2022-05-20 HISTORY — PX: LOWER EXTREMITY ANGIOGRAPHY: CATH118251

## 2022-05-20 LAB — GLUCOSE, CAPILLARY: Glucose-Capillary: 97 mg/dL (ref 70–99)

## 2022-05-20 LAB — BUN: BUN: 26 mg/dL — ABNORMAL HIGH (ref 8–23)

## 2022-05-20 LAB — CREATININE, SERUM
Creatinine, Ser: 1.64 mg/dL — ABNORMAL HIGH (ref 0.61–1.24)
GFR, Estimated: 42 mL/min — ABNORMAL LOW (ref >60)

## 2022-05-20 SURGERY — LOWER EXTREMITY ANGIOGRAPHY
Anesthesia: Moderate Sedation | Site: Leg Lower | Laterality: Right

## 2022-05-20 MED ORDER — ASPIRIN 81 MG PO TBEC: 81.0000 mg | DELAYED_RELEASE_TABLET | Freq: Every day | ORAL | 1 refills | Status: DC

## 2022-05-20 MED ORDER — MIDAZOLAM HCL 5 MG/5ML IJ SOLN
INTRAMUSCULAR | Status: AC
  Filled 2022-05-20: qty 5

## 2022-05-20 MED ORDER — SODIUM CHLORIDE 0.9 % IV SOLN: INTRAVENOUS | Status: DC

## 2022-05-20 MED ORDER — OXYCODONE HCL 5 MG PO TABS: 5.0000 mg | ORAL_TABLET | ORAL | Status: DC | PRN

## 2022-05-20 MED ORDER — ASPIRIN 81 MG PO TBEC
DELAYED_RELEASE_TABLET | ORAL | Status: AC
  Filled 2022-05-20: qty 1

## 2022-05-20 MED ORDER — SODIUM CHLORIDE 0.9% FLUSH: 3.0000 mL | INTRAVENOUS | Status: DC | PRN

## 2022-05-20 MED ORDER — MIDAZOLAM HCL 2 MG/2ML IJ SOLN
INTRAMUSCULAR | Status: DC | PRN
  Administered 2022-05-20: 2 mg via INTRAVENOUS
  Administered 2022-05-20 (×2): .5 mg via INTRAVENOUS

## 2022-05-20 MED ORDER — CEFAZOLIN SODIUM-DEXTROSE 2-4 GM/100ML-% IV SOLN: 2.0000 g | INTRAVENOUS | Status: AC

## 2022-05-20 MED ORDER — HYDROMORPHONE HCL 1 MG/ML IJ SOLN: 1.0000 mg | Freq: Once | INTRAMUSCULAR | Status: DC | PRN

## 2022-05-20 MED ORDER — SODIUM CHLORIDE 0.9% FLUSH: 3.0000 mL | Freq: Two times a day (BID) | INTRAVENOUS | Status: DC

## 2022-05-20 MED ORDER — FENTANYL CITRATE (PF) 100 MCG/2ML IJ SOLN
INTRAMUSCULAR | Status: DC | PRN
  Administered 2022-05-20: 12.5 ug via INTRAVENOUS
  Administered 2022-05-20: 50 ug via INTRAVENOUS
  Administered 2022-05-20: 12.5 ug via INTRAVENOUS

## 2022-05-20 MED ORDER — ACETAMINOPHEN 325 MG PO TABS: 650.0000 mg | ORAL_TABLET | ORAL | Status: DC | PRN

## 2022-05-20 MED ORDER — LABETALOL HCL 5 MG/ML IV SOLN: 10.0000 mg | INTRAVENOUS | Status: DC | PRN

## 2022-05-20 MED ORDER — MIDAZOLAM HCL 2 MG/ML PO SYRP: 8.0000 mg | ORAL_SOLUTION | Freq: Once | ORAL | Status: DC | PRN

## 2022-05-20 MED ORDER — IODIXANOL 320 MG/ML IV SOLN
INTRAVENOUS | Status: DC | PRN
  Administered 2022-05-20: 75 mL

## 2022-05-20 MED ORDER — CEFAZOLIN SODIUM-DEXTROSE 2-4 GM/100ML-% IV SOLN
INTRAVENOUS | Status: AC
  Administered 2022-05-20: 2 g via INTRAVENOUS
  Filled 2022-05-20: qty 100

## 2022-05-20 MED ORDER — FENTANYL CITRATE (PF) 100 MCG/2ML IJ SOLN
INTRAMUSCULAR | Status: AC
  Filled 2022-05-20: qty 2

## 2022-05-20 MED ORDER — MORPHINE SULFATE (PF) 4 MG/ML IV SOLN: 2.0000 mg | INTRAVENOUS | Status: DC | PRN

## 2022-05-20 MED ORDER — HEPARIN SODIUM (PORCINE) 1000 UNIT/ML IJ SOLN
INTRAMUSCULAR | Status: AC
  Filled 2022-05-20: qty 10

## 2022-05-20 MED ORDER — METHYLPREDNISOLONE SODIUM SUCC 125 MG IJ SOLR: 125.0000 mg | Freq: Once | INTRAMUSCULAR | Status: DC | PRN

## 2022-05-20 MED ORDER — HEPARIN SODIUM (PORCINE) 1000 UNIT/ML IJ SOLN
INTRAMUSCULAR | Status: DC | PRN
  Administered 2022-05-20: 6000 [IU] via INTRAVENOUS

## 2022-05-20 MED ORDER — ONDANSETRON HCL 4 MG/2ML IJ SOLN: 4.0000 mg | Freq: Four times a day (QID) | INTRAMUSCULAR | Status: DC | PRN

## 2022-05-20 MED ORDER — HYDRALAZINE HCL 20 MG/ML IJ SOLN: 5.0000 mg | INTRAMUSCULAR | Status: DC | PRN

## 2022-05-20 MED ORDER — DIPHENHYDRAMINE HCL 50 MG/ML IJ SOLN: 50.0000 mg | Freq: Once | INTRAMUSCULAR | Status: DC | PRN

## 2022-05-20 MED ORDER — SODIUM CHLORIDE 0.9 % IV SOLN: 250.0000 mL | INTRAVENOUS | Status: DC | PRN

## 2022-05-20 MED ORDER — FAMOTIDINE 20 MG PO TABS: 40.0000 mg | ORAL_TABLET | Freq: Once | ORAL | Status: DC | PRN

## 2022-05-20 MED ORDER — ASPIRIN 81 MG PO TBEC
81.0000 mg | DELAYED_RELEASE_TABLET | Freq: Once | ORAL | Status: AC
  Administered 2022-05-20: 81 mg via ORAL

## 2022-05-20 SURGICAL SUPPLY — 27 items
BALLN LUTONIX 6X220X130 (BALLOONS) ×1
BALLN LUTONIX DCB 6X80X130 (BALLOONS) ×1
BALLOON LUTONIX 6X220X130 (BALLOONS) IMPLANT
BALLOON LUTONIX DCB 6X80X130 (BALLOONS) IMPLANT
CATH ANGIO 5F PIGTAIL 65CM (CATHETERS) IMPLANT
CATH SEEKER .035X135CM (CATHETERS) IMPLANT
CATH VERT 5X100 (CATHETERS) IMPLANT
COVER DRAPE FLUORO 36X44 (DRAPES) IMPLANT
COVER PROBE ULTRASOUND 5X96 (MISCELLANEOUS) IMPLANT
DEVICE STARCLOSE SE CLOSURE (Vascular Products) IMPLANT
GLIDEWIRE ADV .035X260CM (WIRE) IMPLANT
GOWN STRL REUS W/ TWL LRG LVL3 (GOWN DISPOSABLE) ×1 IMPLANT
GOWN STRL REUS W/TWL LRG LVL3 (GOWN DISPOSABLE) ×1
GUIDEWIRE PFTE-COATED .018X300 (WIRE) IMPLANT
KIT ENCORE 26 ADVANTAGE (KITS) IMPLANT
LIFESTENT SOLO 7X200X135 (Permanent Stent) IMPLANT
NDL ENTRY 21GA 7CM ECHOTIP (NEEDLE) IMPLANT
NEEDLE ENTRY 21GA 7CM ECHOTIP (NEEDLE) ×1 IMPLANT
PACK ANGIOGRAPHY (CUSTOM PROCEDURE TRAY) ×1 IMPLANT
SET INTRO CAPELLA COAXIAL (SET/KITS/TRAYS/PACK) IMPLANT
SHEATH BRITE TIP 5FRX11 (SHEATH) IMPLANT
SHEATH RAABE 6FR (SHEATH) IMPLANT
STENT LIFESTENT 5F 7X80X135 (Permanent Stent) IMPLANT
SYR MEDRAD MARK 7 150ML (SYRINGE) IMPLANT
TUBING CONTRAST HIGH PRESS 72 (TUBING) IMPLANT
WIRE GUIDERIGHT .035X150 (WIRE) IMPLANT
WIRE SUPRACORE 300CM (MISCELLANEOUS) IMPLANT

## 2022-05-20 NOTE — H&P (View-Only) (Signed)
MRN : [id number]  [name]. is a 81 y.o. (1941/06/30) male who presents with chief complaint of check circulation.  History of Present Illness:   The patient is seen for follow-up evaluation of painful lower extremities and diminished pulses associated with ulceration of the right foot.  The patient notes the ulcer has been present for multiple weeks and has not been improving.  He was last seen in the office on March 06, 2022 at which time it was elected to continue wound care to see if the right leg ulcers would heal since his left leg ulcers had closed.  Unfortunately, his right leg remains problematic and so he is agreed to move forward with treatment.  He notes the right foot is very painful and has had some drainage.  No specific history of trauma noted by the patient.  The patient denies fever or chills.  the patient does have diabetes which has been difficult to control.  Patient notes prior to the ulcer developing the extremities were somewhat painful particularly with walking.  The patient denies rest pain or dangling of an extremity off the side of the bed during the night for relief. No prior interventions or surgeries.  No history of back problems or DJD of the lumbar sacral spine.   The patient denies amaurosis fugax or recent TIA symptoms. There are no recent neurological changes noted. The patient denies history of DVT, PE or superficial thrombophlebitis. The patient denies recent episodes of angina or shortness of breath.   Current Meds  Medication Sig   amiodarone (PACERONE) 200 MG tablet Take 1 tablet (200 mg total) by mouth daily.   fluticasone (FLONASE) 50 MCG/ACT nasal spray Place 2 sprays into both nostrils daily.   gabapentin (NEURONTIN) 300 MG capsule Take 1 capsule (300 mg total) by mouth 2 (two) times daily.   levothyroxine (SYNTHROID) 100 MCG tablet TAKE 1 TABLET(100 MCG) BY MOUTH DAILY BEFORE BREAKFAST   lisinopril (ZESTRIL) 2.5  MG tablet Take 1 tablet (2.5 mg total) by mouth daily.   lovastatin (MEVACOR) 20 MG tablet Take 1 tablet (20 mg total) by mouth daily.   metoprolol succinate (TOPROL-XL) 25 MG 24 hr tablet 1 TABLET BY MOUTH ONCE A DAY   torsemide (DEMADEX) 20 MG tablet Take by mouth in the morning and at bedtime.   traMADol (ULTRAM) 50 MG tablet Take 50 mg by mouth 2 (two) times daily as needed.    Past Medical History:  Diagnosis Date   Allergy    History of chicken pox    Hyperlipidemia    Hypertension     Past Surgical History:  Procedure Laterality Date   BACK SURGERY     x5   DOPPLER ECHOCARDIOGRAPHY  06/21/2013   EF=35% while in Sinus Rhythm   Myocardial perfusion scan  10/2012   severe global LV enlargement. Mild RV enlargement. Mild LVH. Mild mitral and tricuspid insufficency   polyp excision  09/2005   UPPER GASTROINTESTINAL ENDOSCOPY  03/27/2004   Gastropathy, Gastritis; Duodenopathy    Social History Social History   Tobacco Use   Smoking status: Former    Packs/day: 2.00    Years: 50.00    Total pack years: 100.00    Types: Cigarettes    Quit date: 07/01/2007    Years since quitting: 14.8   Smokeless tobacco: Never  Vaping Use   Vaping Use: Never used  Substance  Use Topics   Alcohol use: No    Alcohol/week: 0.0 standard drinks of alcohol   Drug use: No    Family History Family History  Problem Relation Age of Onset   Alzheimer's disease Mother    Heart attack Father    Colon cancer Brother     No Known Allergies   REVIEW OF SYSTEMS (Negative unless checked)  Constitutional: [] Weight loss  [] Fever  [] Chills Cardiac: [] Chest pain   [] Chest pressure   [] Palpitations   [] Shortness of breath when laying flat   [] Shortness of breath with exertion. Vascular:  [x] Pain in legs with walking   [x] Pain in legs at rest  [] History of DVT   [] Phlebitis   [] Swelling in legs   [] Varicose veins   [] Non-healing ulcers Pulmonary:   [] Uses home oxygen   [] Productive cough    [] Hemoptysis   [] Wheeze  [] COPD   [] Asthma Neurologic:  [] Dizziness   [] Seizures   [] History of stroke   [] History of TIA  [] Aphasia   [] Vissual changes   [] Weakness or numbness in arm   [] Weakness or numbness in leg Musculoskeletal:   [] Joint swelling   [] Joint pain   [] Low back pain Hematologic:  [] Easy bruising  [] Easy bleeding   [] Hypercoagulable state   [] Anemic Gastrointestinal:  [] Diarrhea   [] Vomiting  [] Gastroesophageal reflux/heartburn   [] Difficulty swallowing. Genitourinary:  [] Chronic kidney disease   [] Difficult urination  [] Frequent urination   [] Blood in urine Skin:  [] Rashes   [x] Ulcers  Psychological:  [] History of anxiety   []  History of major depression.  Physical Examination  Vitals:   05/20/22 1224  BP: (!) 126/53  Pulse: (!) 58  Resp: 14  Temp: 98.4 F (36.9 C)  TempSrc: Oral  SpO2: 100%  Weight: 108.9 kg  Height: 6\' 2"  (1.88 m)   Body mass index is 30.81 kg/m. Gen: WD/WN, NAD Head: [place]/AT, No temporalis wasting.  Ear/Nose/Throat: Hearing grossly intact, nares w/o erythema or drainage Eyes: PER, EOMI, sclera nonicteric.  Neck: Supple, no masses.  No bruit or JVD.  Pulmonary:  Good air movement, no audible wheezing, no use of accessory muscles.  Cardiac: RRR, normal S1, S2, no Murmurs. Vascular:  mild trophic changes, open wounds right foot/ankle Vessel Right Left  Radial Palpable Palpable  PT Not Palpable Not Palpable  DP Not Palpable Not Palpable  Gastrointestinal: soft, non-distended. No guarding/no peritoneal signs.  Musculoskeletal: M/S 5/5 throughout.  No visible deformity.  Neurologic: CN 2-12 intact. Pain and light touch intact in extremities.  Symmetrical.  Speech is fluent. Motor exam as listed above. Psychiatric: Judgment intact, Mood & affect appropriate for pt's clinical situation. Dermatologic: No rashes or ulcers noted.  No changes consistent with cellulitis.   CBC Lab Results  Component Value Date   WBC 7.2 01/07/2022   HGB 11.3 (L)  01/07/2022   HCT 34.2 (L) 01/07/2022   MCV 94 01/07/2022   PLT 226 01/07/2022    BMET    Component Value Date/Time   NA 140 01/07/2022 0925   K 4.7 01/07/2022 0925   CL 100 01/07/2022 0925   CO2 23 01/07/2022 0925   GLUCOSE 87 01/07/2022 0925   GLUCOSE 108 (H) 11/26/2021 1038   BUN 26 (H) 05/20/2022 1230   BUN 32 (H) 01/07/2022 0925   CREATININE 1.64 (H) 05/20/2022 1230   CREATININE 1.71 (H) 03/19/2017 0813   CALCIUM 9.6 01/07/2022 0925   GFRNONAA 42 (L) 05/20/2022 1230   GFRNONAA 39 (L) 03/19/2017 0813   GFRAA 39 (  L) 04/04/2020 0808   GFRAA 45 (L) 03/19/2017 0813   Estimated Creatinine Clearance: 48 mL/min (A) (by C-G formula based on SCr of 1.64 mg/dL (H)).  COAG Lab Results  Component Value Date   INR 1.4 (H) 11/26/2021    Radiology No results found.   Assessment/Plan Atherosclerotic occlusive disease bilateral lower extremities with ulcerations of the right foot: Patient has atherosclerotic occlusive disease of the lower extremities with nonhealing ulcerations.  This places him at risk for limb loss.  Risks and benefits for angiography with hope for intervention were reviewed all questions have been answered patient agrees to proceed.  Angiography is being performed for limb salvage purposes. Hypertension:  Continue antihypertensive medications as already ordered, these medications have been reviewed and there are no changes at this time. Hyperlipidemia:  Continue statin as ordered and reviewed, no changes at this time Atrial fibrillation:  Continue antiarrhythmia medications as already ordered, these medications have been reviewed and there are no changes at this time.  Continue anticoagulation as ordered by Cardiology Service   Hortencia Pilar, MD  05/20/2022 1:39 PM

## 2022-05-20 NOTE — Discharge Instructions (Signed)
Per Dr Delana Meyer bedside instruction. Take half dose of Xarelto tonight and resume regular dose tomorrow

## 2022-05-20 NOTE — Op Note (Signed)
[facility]  Percutaneous Study/Intervention Procedural Note   Date of Surgery: 05/20/2022  Surgeon:  Katha Cabal, MD.  Pre-operative Diagnosis: Atherosclerotic occlusive disease bilateral lower extremities with ulceration of the right lower extremity  Post-operative diagnosis:  Same  Procedure(s) Performed:             1.  Introduction catheter into right lower extremity 3rd order catheter placement              2.    Contrast injection right lower extremity for distal runoff             3.  Percutaneous transluminal angioplasty and stent placement right SFA and popliteal             4.  Star close closure left common femoral arteriotomy  Anesthesia: Conscious sedation was administered under my direct supervision by the interventional radiology RN. IV Versed plus fentanyl were utilized. Continuous ECG, pulse oximetry and blood pressure was monitored throughout the entire procedure.  Conscious sedation was for a total of 1 hour and 31 seconds.  Sheath: 6 [place] Rabie left common femoral retrograde  Contrast: 75 cc  Fluoroscopy Time: 12.2 minutes  Indications:  81 [name] Friends Sr. presents with nonhealing wounds of the right lower extremity.  Noninvasive studies as well as physical examination demonstrate significant atherosclerotic occlusive disease.  This places the patient at increased risk for limb loss.  Patient is undergoing angiography with the hope for intervention for limb salvage.  The risks and benefits are reviewed all questions answered patient agrees to proceed with angiography and intervention right lower extremity.  Procedure:  [name] MCKEY Sr. is a 81 y.o. y.o. male who was identified and appropriate procedural time out was performed.  The patient was then placed supine on the table and prepped and draped in the usual sterile fashion.    Ultrasound was placed in the sterile sleeve and the left groin was evaluated the left common  femoral artery was echolucent and pulsatile indicating patency.  Image was recorded for the permanent record and under real-time visualization a microneedle was inserted into the common femoral artery microwire followed by a micro-sheath.  A J-wire was then advanced through the micro-sheath and a  5 [place] sheath was then inserted over a J-wire. J-wire was then advanced and a 5 French pigtail catheter was positioned at the level of T12. AP projection of the aorta was then obtained. Pigtail catheter was repositioned to above the bifurcation and a LAO view of the pelvis was obtained.  Subsequently a pigtail catheter with the stiff angle Glidewire was used to cross the aortic bifurcation the catheter wire were advanced down into the right distal external iliac artery. Oblique view of the femoral bifurcation was then obtained and subsequently the wire was reintroduced and the pigtail catheter negotiated into the SFA representing third order catheter placement. Distal runoff was then performed.  6000 units of heparin was then given and allowed to circulate and a 6 [place] Rabie sheath was advanced up and over the bifurcation and positioned in the femoral artery  KMP  catheter and stiff angle Glidewire were then negotiated down into the distal popliteal.  Distal runoff was then completed by hand injection through the catheter verifying intraluminal positioning. The super core wire was then reintroduced and a 7 mm x 200 mm life stent followed by a 7 mm x 80 mm life stent are then deployed from the mid SFA to the mid  popliteal.  The stents were then postdilated with a 6 mm x 220 mm Lutonix drug-eluting balloon followed by a 6 mm x 80 mm Lutonix drug-eluting balloon.  Distal runoff was then reassessed.  After review of these images the sheath is pulled into the left external iliac oblique of the common femoral is obtained and a Star close device deployed. There no immediate complications.   Findings:  The abdominal  aorta is opacified with a bolus injection contrast. Renal arteries are single and widely patent. The aorta itself has diffuse disease but no hemodynamically significant lesions however it does show moderate to severe tortuosity.  In the mid infrarenal portion of the aorta there is mild enlargement with an obvious calcific rim.  The common and external iliac arteries are widely patent bilaterally as is the internal iliac arteries.  The right common femoral is widely patent as is the profunda femoris.  The SFA does indeed have a significant stenosis beginning in its midportion which becomes an occlusion at Hunter's canal.  The occlusion is approximately 15 cm in length.  The mid to distal popliteal demonstrates calcific disease but no flow-limiting lesions are identified and the trifurcation is widely patent.  Tibioperoneal trunk peroneal and posterior tibial are all widely patent down to the foot.  Anterior tibial occludes proximally  Angioplasty and stent placement of the right SFA across right Hunter's canal yields an excellent result with less than 10% residual stenosis.    Summary: Successful recanalization right lower extremity for limb salvage                        Disposition: Patient was taken to the recovery room in stable condition having tolerated the procedure well.  [name], Dolores Lory 05/20/2022,3:30 PM

## 2022-05-20 NOTE — Interval H&P Note (Signed)
History and Physical Interval Note:  05/20/2022 1:47 PM  [name] Friends Sr.  has presented today for surgery, with the diagnosis of RLE Angio   BARD   ASO w ulceration.  The various methods of treatment have been discussed with the patient and family. After consideration of risks, benefits and other options for treatment, the patient has consented to  Procedure(s): Lower Extremity Angiography (Right) as a surgical intervention.  The patient's history has been reviewed, patient examined, no change in status, stable for surgery.  I have reviewed the patient's chart and labs.  Questions were answered to the patient's satisfaction.     Hortencia Pilar

## 2022-05-20 NOTE — Progress Notes (Signed)
MRN : [id number]  [name] MONRREAL Sr. is a 82 y.o. (January 27, 1941) male who presents with chief complaint of check circulation.  History of Present Illness:   The patient is seen for follow-up evaluation of painful lower extremities and diminished pulses associated with ulceration of the right foot.  The patient notes the ulcer has been present for multiple weeks and has not been improving.  He was last seen in the office on March 06, 2022 at which time it was elected to continue wound care to see if the right leg ulcers would heal since his left leg ulcers had closed.  Unfortunately, his right leg remains problematic and so he is agreed to move forward with treatment.  He notes the right foot is very painful and has had some drainage.  No specific history of trauma noted by the patient.  The patient denies fever or chills.  the patient does have diabetes which has been difficult to control.  Patient notes prior to the ulcer developing the extremities were somewhat painful particularly with walking.  The patient denies rest pain or dangling of an extremity off the side of the bed during the night for relief. No prior interventions or surgeries.  No history of back problems or DJD of the lumbar sacral spine.   The patient denies amaurosis fugax or recent TIA symptoms. There are no recent neurological changes noted. The patient denies history of DVT, PE or superficial thrombophlebitis. The patient denies recent episodes of angina or shortness of breath.   Current Meds  Medication Sig   amiodarone (PACERONE) 200 MG tablet Take 1 tablet (200 mg total) by mouth daily.   fluticasone (FLONASE) 50 MCG/ACT nasal spray Place 2 sprays into both nostrils daily.   gabapentin (NEURONTIN) 300 MG capsule Take 1 capsule (300 mg total) by mouth 2 (two) times daily.   levothyroxine (SYNTHROID) 100 MCG tablet TAKE 1 TABLET(100 MCG) BY MOUTH DAILY BEFORE BREAKFAST   lisinopril (ZESTRIL) 2.5  MG tablet Take 1 tablet (2.5 mg total) by mouth daily.   lovastatin (MEVACOR) 20 MG tablet Take 1 tablet (20 mg total) by mouth daily.   metoprolol succinate (TOPROL-XL) 25 MG 24 hr tablet 1 TABLET BY MOUTH ONCE A DAY   torsemide (DEMADEX) 20 MG tablet Take by mouth in the morning and at bedtime.   traMADol (ULTRAM) 50 MG tablet Take 50 mg by mouth 2 (two) times daily as needed.    Past Medical History:  Diagnosis Date   Allergy    History of chicken pox    Hyperlipidemia    Hypertension     Past Surgical History:  Procedure Laterality Date   BACK SURGERY     x5   DOPPLER ECHOCARDIOGRAPHY  06/21/2013   EF=35% while in Sinus Rhythm   Myocardial perfusion scan  10/2012   severe global LV enlargement. Mild RV enlargement. Mild LVH. Mild mitral and tricuspid insufficency   polyp excision  09/2005   UPPER GASTROINTESTINAL ENDOSCOPY  03/27/2004   Gastropathy, Gastritis; Duodenopathy    Social History Social History   Tobacco Use   Smoking status: Former    Packs/day: 2.00    Years: 50.00    Total pack years: 100.00    Types: Cigarettes    Quit date: 07/01/2007    Years since quitting: 14.8   Smokeless tobacco: Never  Vaping Use   Vaping Use: Never used  Substance  Use Topics   Alcohol use: No    Alcohol/week: 0.0 standard drinks of alcohol   Drug use: No    Family History Family History  Problem Relation Age of Onset   Alzheimer's disease Mother    Heart attack Father    Colon cancer Brother     No Known Allergies   REVIEW OF SYSTEMS (Negative unless checked)  Constitutional: [] Weight loss  [] Fever  [] Chills Cardiac: [] Chest pain   [] Chest pressure   [] Palpitations   [] Shortness of breath when laying flat   [] Shortness of breath with exertion. Vascular:  [x] Pain in legs with walking   [x] Pain in legs at rest  [] History of DVT   [] Phlebitis   [] Swelling in legs   [] Varicose veins   [] Non-healing ulcers Pulmonary:   [] Uses home oxygen   [] Productive cough    [] Hemoptysis   [] Wheeze  [] COPD   [] Asthma Neurologic:  [] Dizziness   [] Seizures   [] History of stroke   [] History of TIA  [] Aphasia   [] Vissual changes   [] Weakness or numbness in arm   [] Weakness or numbness in leg Musculoskeletal:   [] Joint swelling   [] Joint pain   [] Low back pain Hematologic:  [] Easy bruising  [] Easy bleeding   [] Hypercoagulable state   [] Anemic Gastrointestinal:  [] Diarrhea   [] Vomiting  [] Gastroesophageal reflux/heartburn   [] Difficulty swallowing. Genitourinary:  [] Chronic kidney disease   [] Difficult urination  [] Frequent urination   [] Blood in urine Skin:  [] Rashes   [x] Ulcers  Psychological:  [] History of anxiety   []  History of major depression.  Physical Examination  Vitals:   05/20/22 1224  BP: (!) 126/53  Pulse: (!) 58  Resp: 14  Temp: 98.4 F (36.9 C)  TempSrc: Oral  SpO2: 100%  Weight: 108.9 kg  Height: 6\' 2"  (1.88 m)   Body mass index is 30.81 kg/m. Gen: WD/WN, NAD Head: [place]/AT, No temporalis wasting.  Ear/Nose/Throat: Hearing grossly intact, nares w/o erythema or drainage Eyes: PER, EOMI, sclera nonicteric.  Neck: Supple, no masses.  No bruit or JVD.  Pulmonary:  Good air movement, no audible wheezing, no use of accessory muscles.  Cardiac: RRR, normal S1, S2, no Murmurs. Vascular:  mild trophic changes, open wounds right foot/ankle Vessel Right Left  Radial Palpable Palpable  PT Not Palpable Not Palpable  DP Not Palpable Not Palpable  Gastrointestinal: soft, non-distended. No guarding/no peritoneal signs.  Musculoskeletal: M/S 5/5 throughout.  No visible deformity.  Neurologic: CN 2-12 intact. Pain and light touch intact in extremities.  Symmetrical.  Speech is fluent. Motor exam as listed above. Psychiatric: Judgment intact, Mood & affect appropriate for pt's clinical situation. Dermatologic: No rashes or ulcers noted.  No changes consistent with cellulitis.   CBC Lab Results  Component Value Date   WBC 7.2 01/07/2022   HGB 11.3 (L)  01/07/2022   HCT 34.2 (L) 01/07/2022   MCV 94 01/07/2022   PLT 226 01/07/2022    BMET    Component Value Date/Time   NA 140 01/07/2022 0925   K 4.7 01/07/2022 0925   CL 100 01/07/2022 0925   CO2 23 01/07/2022 0925   GLUCOSE 87 01/07/2022 0925   GLUCOSE 108 (H) 11/26/2021 1038   BUN 26 (H) 05/20/2022 1230   BUN 32 (H) 01/07/2022 0925   CREATININE 1.64 (H) 05/20/2022 1230   CREATININE 1.71 (H) 03/19/2017 0813   CALCIUM 9.6 01/07/2022 0925   GFRNONAA 42 (L) 05/20/2022 1230   GFRNONAA 39 (L) 03/19/2017 0813   GFRAA 39 (  L) 04/04/2020 0808   GFRAA 45 (L) 03/19/2017 0813   Estimated Creatinine Clearance: 48 mL/min (A) (by C-G formula based on SCr of 1.64 mg/dL (H)).  COAG Lab Results  Component Value Date   INR 1.4 (H) 11/26/2021    Radiology No results found.   Assessment/Plan Atherosclerotic occlusive disease bilateral lower extremities with ulcerations of the right foot: Patient has atherosclerotic occlusive disease of the lower extremities with nonhealing ulcerations.  This places him at risk for limb loss.  Risks and benefits for angiography with hope for intervention were reviewed all questions have been answered patient agrees to proceed.  Angiography is being performed for limb salvage purposes. Hypertension:  Continue antihypertensive medications as already ordered, these medications have been reviewed and there are no changes at this time. Hyperlipidemia:  Continue statin as ordered and reviewed, no changes at this time Atrial fibrillation:  Continue antiarrhythmia medications as already ordered, these medications have been reviewed and there are no changes at this time.  Continue anticoagulation as ordered by Cardiology Service   Hortencia Pilar, MD  05/20/2022 1:39 PM

## 2022-05-21 ENCOUNTER — Encounter: Payer: Self-pay | Admitting: Vascular Surgery

## 2022-05-29 ENCOUNTER — Ambulatory Visit (INDEPENDENT_AMBULATORY_CARE_PROVIDER_SITE_OTHER): Payer: PPO | Admitting: Vascular Surgery

## 2022-05-29 ENCOUNTER — Encounter (INDEPENDENT_AMBULATORY_CARE_PROVIDER_SITE_OTHER): Payer: PPO

## 2022-05-29 ENCOUNTER — Encounter: Payer: PPO | Admitting: Physician Assistant

## 2022-05-29 ENCOUNTER — Other Ambulatory Visit (INDEPENDENT_AMBULATORY_CARE_PROVIDER_SITE_OTHER): Payer: PPO

## 2022-05-29 DIAGNOSIS — L97512 Non-pressure chronic ulcer of other part of right foot with fat layer exposed: Secondary | ICD-10-CM | POA: Diagnosis not present

## 2022-05-29 DIAGNOSIS — L97812 Non-pressure chronic ulcer of other part of right lower leg with fat layer exposed: Secondary | ICD-10-CM | POA: Diagnosis not present

## 2022-05-29 DIAGNOSIS — I87333 Chronic venous hypertension (idiopathic) with ulcer and inflammation of bilateral lower extremity: Secondary | ICD-10-CM | POA: Diagnosis not present

## 2022-05-29 NOTE — Progress Notes (Addendum)
[name] ([id number]) [id number] Page 1 of 8 Visit Report for 05/29/2022 Chief Complaint Document Details Patient Name: Date of Service: [name] [facility] [name]. 05/29/2022 10:00 A M Medical Record Number: [id number] Patient Account Number: [id number] Date of Birth/Sex: Treating RN: 1941/03/06 (81 y.o. [name] Primary Care Provider: [name] Other Clinician: Referring Provider: Treating Provider/Extender: [name] in Treatment: 19 Information Obtained from: Patient Chief Complaint Multiple LE Ulcers Electronic Signature(s) Signed: 05/29/2022 10:19:37 AM By: Worthy Keeler PA-C Entered By: Worthy Keeler on 05/29/2022 10:19:37 -------------------------------------------------------------------------------- HPI Details Patient Name: Date of Service: [name] [facility] [name]. 05/29/2022 10:00 A M Medical Record Number: [id number] Patient Account Number: [id number] Date of Birth/Sex: Treating RN: 01-Sep-1940 (81 y.o. [name] Primary Care Provider: [name] Other Clinician: Referring Provider: Treating Provider/Extender: [name] in Treatment: 19 History of Present Illness HPI Description: 01-16-2022 upon evaluation today patient presents for initial inspection here in the clinic concerning issues that he has been having with wounds over the bilateral lower extremities and bilateral feet. Fortunately there does not appear to be any signs of significant infection at this point which is great news. Unfortunately his ABIs were registering at 0.49 on the left and 0.57 on the right here in the clinic on the screening today. With that being said I do think that it may possibly be true that we need to get him to formal arterial studies in order to see how things really are showing up. And the patient's not in disagreement with this for that reason we are going to make that referral as well. With that  being said he does have some significant past medical history items of note detailed below. Patient does have chronic kidney disease stage IIIb, coronary artery disease, long-term use of anticoagulant therapy, dilated cardiomyopathy. Proximal atrial fibrillation, he is on Xarelto long-term. He also has congestive heart failure, hypertension, peripheral neuropathy not related to diabetes as he is only prediabetic with a hemoglobin A1c of 5.8, and chronic venous hypertension. He has recently seen Dr. [name] show his cardiologist who did discontinue the hydrochlorothiazide and the Lasix and felt that the patient was retaining more fluid than he should be. For that reason he was started on furosemide 20 mg twice daily. 7/27; patient with bilateral lower extremity wounds. These are largely venous although he had very poor ABIs on screening test here. He has an appointment apparently tomorrow at vein and vascular for arterial studies. He was not too keen on the latter although I think I emphasized the need to do this both in terms of dealing with the current wounds and being prepared for further issues down the road. We have been using silver alginate on the wounds and Tubigrip's edema [name], [name] ([id number]) [id number] Page 2 of 8 control is better per our intake nurse 02-06-2022 upon evaluation today patient appears to be doing well with regard to his wound. Fortunately there is no signs of infection he is actually healing for the most part on the bilateral feet. His legs are also doing much better though he has 1 new area that popped up on the right leg. Fortunately there is no evidence of active infection of note his ABIs and TBI's were poor on the vascular screening on the 31st with the left being worse than the right. For that reason I am going to recommend he still continue to follow-up with vascular to discuss angioplasty/stenting. [id number] upon  evaluation today  patient appears to be doing well currently in regard to his wound on the leg although he has several other blisters that are showing up at this point. I do believe that we need to try to do something to get his swelling under control this could be of utmost importance as far as getting this healed is concerned. 03-06-2022 upon evaluation today patient appears to be doing well currently in regard to his wounds. In fact the legs are drying up and seem to be healing quite nicely the lateral wounds are still weeping a little bit about the medial side actually appears to be pretty much dry and closed. The plantar aspect of his foot still is draining a bit but again it seems to be doing much better which is great news.[name] 03-13-2022 upon evaluation patient appears to be almost completely healed. His leg is doing excellent and his foot is just about closed. With that being said he did see vascular and they actually discussed the possibility of doing an arteriogram/angiogram in order to see if anything needed to be the balloons were stented open. With that being said he decided not to do that since things are healing so well right now and he is going to consider that for future they plan to see him back in November. 03-21-2022 upon evaluation today patient appears to be doing a little worse in regard to his wound on the foot. The wound keeps seeming to pull together unfortunately which is causing a trapped some fluid is just not healing quite as well as would like to see. With that being said I am going to try to remove a bit more of the overgrowth callus tissue today and hopefully that can help get this to seal up much more effectively and quickly. Fortunately I see no signs of infection of the foot although the leg on the right does seem to show some signs of erythema today I do think we need to address that currently. This looks to be cellulitis. 03-31-2022 upon evaluation today patient appears to be doing better  in regard to the cellulitis I am pleased in that regard he is still taking the antibiotics. Fortunately I do not see any evidence of active infection locally or systemically at this time which is great news. No fevers, chills, nausea, vomiting, or diarrhea. 04-10-2022 upon evaluation today patient appears to be doing well currently in regard to his foot ulcer I think were very close to complete resolution which is great news. Fortunately I do not see any signs of active infection locally or systemically at this time. No fevers, chills, nausea, vomiting, or diarrhea. 04-24-2022 upon evaluation today patient appears to be doing about the same in regard to his foot ulcer. Were not really seeing much in the way of improvements here. With that being said I do believe that he is tolerating the dressing changes without complication. No fevers, chills, nausea, vomiting, or diarrhea. With that being said the wound on his right plantar foot just does not seem to be making progress here. I do believe that he may benefit from vascular intervention which we previously discussed but he is previously wanted to hold off on. However at this point I think that this is becoming more crucial and he did ask me about getting in touch with the vascular doctor in order to see if they can get him scheduled sooner his next follow-up until like the end of December. I am more than happy  to do this as I think it would be definitely beneficial for him. 05-01-2022 upon evaluation patient's wound bed actually showed signs of good granulation and epithelization at this point all things considered. Still his ABIs are very low is about 50% on each leg. With that being said he does have a follow-up with vascular in about 2 weeks maybe 2-1/2 weeks where he is going to have the angiogram for the right leg and then subsequently the left leg will be to follow. Nonetheless I think this is can make a big difference in getting the wounds healed  to be honest. Fortunately I do not see any signs of active infection at this point. 11/9; patient arrives in clinic with 2 new superficial wounds on the right leg and right ankle. Both of these and the same condition of the original wound. In terms of the wound on his plantar foot about the same. We have been using collagen. His arterial review/angiogram is due for November 21 05-15-2022 upon evaluation today patient appears to be doing well currently in regard to his wounds all things considered. He actually has an appointment upcoming with the vascular surgeon where he is going to be undergoing intervention. Obviously I think this could be beneficial as far as helping this right leg and foot to heal it also allows to be able to put compression on which I think is much needed. He voiced understanding. That is on 21 November.. 05-29-2022 upon evaluation today patient appears to be doing significantly better in regard to his leg in general he seems to have much better blood flow compared to previous. Fortunately there does not appear to be any signs of infection locally nor systemically at this time. The good news is post his intervention procedure with Dr. Delana Meyer he only has 10% residual blockage in the right leg and to be honest this already looks much better from a color perspective I am very pleased with where we stand. Electronic Signature(s) Signed: 05/29/2022 11:16:21 AM By: Worthy Keeler PA-C Entered By: Worthy Keeler on 05/29/2022 11:16:21 -------------------------------------------------------------------------------- Physical Exam Details Patient Name: Date of Service: [name] [facility] [name]. 05/29/2022 10:00 A M Medical Record Number: [id number] Patient Account Number: [id number] Date of Birth/Sex: Treating RN: [birth year] (81 y.o. [name] Primary Care Provider: [name] Other Clinician: Referring Provider: Treating Provider/Extender: [name] [place],  [place] [name] ([id number]) [phone].pdf Page 3 of 8 Weeks in Treatment: 53 Constitutional Well-nourished and well-hydrated in no acute distress. Respiratory normal breathing without difficulty. Psychiatric this patient is able to make decisions and demonstrates good insight into disease process. Alert and Oriented x 3. pleasant and cooperative. Notes Patient's wound bed actually showed signs of good granulation epithelization I did perform some debridement in regard to the foot to clearway some of the callus and necrotic debris so this hopefully has a chance to heal more open with the improved blood flow at this point he should do much better as far as that is concerned. Electronic Signature(s) Signed: 05/29/2022 11:16:50 AM By: Worthy Keeler PA-C Entered By: Worthy Keeler on 05/29/2022 11:16:50 -------------------------------------------------------------------------------- Physician Orders Details Patient Name: Date of Service: [name] [facility] [name]. 05/29/2022 10:00 A M Medical Record Number: [id number] Patient Account Number: [id number] Date of Birth/Sex: Treating RN: 09-04-1940 (81 y.o. [name] Primary Care Provider: [name] Other Clinician: Referring Provider: Treating Provider/Extender: [name] in Treatment: [phone] Verbal / Phone Orders: No Diagnosis Coding ICD-10  Coding Code Description I87.333 Chronic venous hypertension (idiopathic) with ulcer and inflammation of bilateral lower extremity L97.812 Non-pressure chronic ulcer of other part of right lower leg with fat layer exposed L97.822 Non-pressure chronic ulcer of other part of left lower leg with fat layer exposed G90.09 Other idiopathic peripheral autonomic neuropathy L97.522 Non-pressure chronic ulcer of other part of left foot with fat layer exposed L97.512 Non-pressure chronic ulcer of other part of right foot with fat layer exposed I10 Essential (primary)  hypertension I50.42 Chronic combined systolic (congestive) and diastolic (congestive) heart failure I48.0 Paroxysmal atrial fibrillation I42.0 Dilated cardiomyopathy Z79.01 Long term (current) use of anticoagulants I25.10 Atherosclerotic heart disease of native coronary artery without angina pectoris N18.32 Chronic kidney disease, stage 3b Follow-up Appointments Return Appointment in 1 week. Nurse Visit as needed Bathing/ Shower/ Hygiene May shower with wound dressing protected with water repellent cover or cast protector. Edema Control - Lymphedema / Segmental Compressive Device / Other Tubigrip double layer applied - tubi E double layer [name], [name] ([id number]) [id number] Page 4 of 8 Elevate, Exercise Daily and A void Standing for Long Periods of Time. Elevate legs to the level of the heart and pump ankles as often as possible Elevate leg(s) parallel to the floor when sitting. Off-Loading Right Lower Extremity Open toe surgical shoe with peg assist. Wound Treatment Wound #2 - Foot Wound Laterality: Plantar, Right Cleanser: Wound Cleanser 3 x Per Week/30 Days Discharge Instructions: Wash your hands with soap and water. Remove old dressing, discard into plastic bag and place into trash. Cleanse the wound with Wound Cleanser prior to applying a clean dressing using gauze sponges, not tissues or cotton balls. Do not scrub or use excessive force. Pat dry using gauze sponges, not tissue or cotton balls. Prim Dressing: Prisma 4.34 (in) 3 x Per Week/30 Days ary Discharge Instructions: Moisten w/normal saline or sterile water; Cover wound as directed. Do not remove from wound bed. Secondary Dressing: Zetuvit Plus 4x8 (in/in) 3 x Per Week/30 Days Secured With: Tubigrip Size E, 3.5x10 (in/yds) 3 x Per Week/30 Days Discharge Instructions: Apply 3 Tubigrip E 3-finger-widths below knee to base of toes to secure dressing and/or for swelling. Wound #9 - Lower Leg  Wound Laterality: Right, Lateral, Superior Cleanser: Wound Cleanser 3 x Per Week/30 Days Discharge Instructions: Wash your hands with soap and water. Remove old dressing, discard into plastic bag and place into trash. Cleanse the wound with Wound Cleanser prior to applying a clean dressing using gauze sponges, not tissues or cotton balls. Do not scrub or use excessive force. Pat dry using gauze sponges, not tissue or cotton balls. Secondary Dressing: ABD Pad 5x9 (in/in) 3 x Per Week/30 Days Discharge Instructions: Cover with ABD pad Secured With: Conform 4'' - Conforming Stretch Gauze Bandage 4x75 (in/in) 3 x Per Week/30 Days Discharge Instructions: Apply as directed Secured With: Tubigrip Size E, 3.5x10 (in/yds) 3 x Per Week/30 Days Discharge Instructions: Apply 3 Tubigrip E 3-finger-widths below knee to base of toes to secure dressing and/or for swelling. Electronic Signature(s) Signed: 05/29/2022 4:35:18 PM By: Rosalio Loud MSN RN CNS WTA Signed: 05/29/2022 5:46:39 PM By: Worthy Keeler PA-C Entered By: Rosalio Loud on 05/29/2022 16:35:17 -------------------------------------------------------------------------------- Problem List Details Patient Name: Date of Service: [name] [facility] [name]. 05/29/2022 10:00 A M Medical Record Number: [id number] Patient Account Number: [id number] Date of Birth/Sex: Treating RN: [birth year] (81 y.o. [name] Primary Care Provider: [name] Other Clinician: Referring Provider: Treating Provider/Extender: [name]  in Treatment: 19 Active Problems ICD-10 Encounter Code Description Active Date MDM Diagnosis I87.333 Chronic venous hypertension (idiopathic) with ulcer and inflammation of 01/16/2022 No Yes bilateral lower extremity [name], [name] ([id number]) [id number] Page 5 of 8 [phone] Non-pressure chronic ulcer of other part of right lower leg with fat layer 01/16/2022 No  Yes exposed L97.822 Non-pressure chronic ulcer of other part of left lower leg with fat layer exposed 01/16/2022 No Yes G90.09 Other idiopathic peripheral autonomic neuropathy 01/16/2022 No Yes L97.522 Non-pressure chronic ulcer of other part of left foot with fat layer exposed 01/16/2022 No Yes L97.512 Non-pressure chronic ulcer of other part of right foot with fat layer exposed 01/16/2022 No Yes I10 Essential (primary) hypertension 01/16/2022 No Yes I50.42 Chronic combined systolic (congestive) and diastolic (congestive) heart failure 01/16/2022 No Yes I48.0 Paroxysmal atrial fibrillation 01/16/2022 No Yes I42.0 Dilated cardiomyopathy 01/16/2022 No Yes Z79.01 Long term (current) use of anticoagulants 01/16/2022 No Yes I25.10 Atherosclerotic heart disease of native coronary artery without angina pectoris 01/16/2022 No Yes N18.32 Chronic kidney disease, stage 3b 01/16/2022 No Yes Inactive Problems Resolved Problems Electronic Signature(s) Signed: 05/29/2022 4:36:47 PM By: Rosalio Loud MSN RN CNS WTA Signed: 05/29/2022 5:46:39 PM By: Worthy Keeler PA-C Previous Signature: 05/29/2022 10:19:34 AM Version By: Worthy Keeler PA-C Entered By: Rosalio Loud on 05/29/2022 16:36:47 Tabora, Colten [name] ([id number]) [id number] Page 6 of 8 -------------------------------------------------------------------------------- Progress Note Details Patient Name: Date of Service: [name] [facility] [name]. 05/29/2022 10:00 A M Medical Record Number: [id number] Patient Account Number: [id number] Date of Birth/Sex: Treating RN: [birth year] (81 y.o. [name] Primary Care Provider: [name] Other Clinician: Referring Provider: Treating Provider/Extender: [name] in Treatment: 19 Subjective Chief Complaint Information obtained from Patient Multiple LE Ulcers History of Present Illness (HPI) 01-16-2022 upon evaluation today patient presents for initial inspection  here in the clinic concerning issues that he has been having with wounds over the bilateral lower extremities and bilateral feet. Fortunately there does not appear to be any signs of significant infection at this point which is great news. Unfortunately his ABIs were registering at 0.49 on the left and 0.57 on the right here in the clinic on the screening today. With that being said I do think that it may possibly be true that we need to get him to formal arterial studies in order to see how things really are showing up. And the patient's not in disagreement with this for that reason we are going to make that referral as well. With that being said he does have some significant past medical history items of note detailed below. Patient does have chronic kidney disease stage IIIb, coronary artery disease, long-term use of anticoagulant therapy, dilated cardiomyopathy. Proximal atrial fibrillation, he is on Xarelto long-term. He also has congestive heart failure, hypertension, peripheral neuropathy not related to diabetes as he is only prediabetic with a hemoglobin A1c of 5.8, and chronic venous hypertension. He has recently seen Dr. [name] show his cardiologist who did discontinue the hydrochlorothiazide and the Lasix and felt that the patient was retaining more fluid than he should be. For that reason he was started on furosemide 20 mg twice daily. 7/27; patient with bilateral lower extremity wounds. These are largely venous although he had very poor ABIs on screening test here. He has an appointment apparently tomorrow at vein and vascular for arterial studies. He was not too keen on the latter although I think I emphasized the need to do this both  in terms of dealing with the current wounds and being prepared for further issues down the road. We have been using silver alginate on the wounds and Tubigrip's edema control is better per our intake nurse 02-06-2022 upon evaluation today patient appears to be  doing well with regard to his wound. Fortunately there is no signs of infection he is actually healing for the most part on the bilateral feet. His legs are also doing much better though he has 1 new area that popped up on the right leg. Fortunately there is no evidence of active infection of note his ABIs and TBI's were poor on the vascular screening on the 31st with the left being worse than the right. For that reason I am going to recommend he still continue to follow-up with vascular to discuss angioplasty/stenting. 8-124-23 upon evaluation today patient appears to be doing well currently in regard to his wound on the leg although he has several other blisters that are showing up at this point. I do believe that we need to try to do something to get his swelling under control this could be of utmost importance as far as getting this healed is concerned. 03-06-2022 upon evaluation today patient appears to be doing well currently in regard to his wounds. In fact the legs are drying up and seem to be healing quite nicely the lateral wounds are still weeping a little bit about the medial side actually appears to be pretty much dry and closed. The plantar aspect of his foot still is draining a bit but again it seems to be doing much better which is great news.[name] 03-13-2022 upon evaluation patient appears to be almost completely healed. His leg is doing excellent and his foot is just about closed. With that being said he did see vascular and they actually discussed the possibility of doing an arteriogram/angiogram in order to see if anything needed to be the balloons were stented open. With that being said he decided not to do that since things are healing so well right now and he is going to consider that for future they plan to see him back in November. 03-21-2022 upon evaluation today patient appears to be doing a little worse in regard to his wound on the foot. The wound keeps seeming to pull  together unfortunately which is causing a trapped some fluid is just not healing quite as well as would like to see. With that being said I am going to try to remove a bit more of the overgrowth callus tissue today and hopefully that can help get this to seal up much more effectively and quickly. Fortunately I see no signs of infection of the foot although the leg on the right does seem to show some signs of erythema today I do think we need to address that currently. This looks to be cellulitis. 03-31-2022 upon evaluation today patient appears to be doing better in regard to the cellulitis I am pleased in that regard he is still taking the antibiotics. Fortunately I do not see any evidence of active infection locally or systemically at this time which is great news. No fevers, chills, nausea, vomiting, or diarrhea. 04-10-2022 upon evaluation today patient appears to be doing well currently in regard to his foot ulcer I think were very close to complete resolution which is great news. Fortunately I do not see any signs of active infection locally or systemically at this time. No fevers, chills, nausea, vomiting, or diarrhea. 04-24-2022 upon evaluation today  patient appears to be doing about the same in regard to his foot ulcer. Were not really seeing much in the way of improvements here. With that being said I do believe that he is tolerating the dressing changes without complication. No fevers, chills, nausea, vomiting, or diarrhea. With that being said the wound on his right plantar foot just does not seem to be making progress here. I do believe that he may benefit from vascular intervention which we previously discussed but he is previously wanted to hold off on. However at this point I think that this is becoming more crucial and he did ask me about getting in touch with the vascular doctor in order to see if they can get him scheduled sooner his next follow-up until like the end of December. I  am more than happy to do this as I think it would be definitely beneficial for him. 05-01-2022 upon evaluation patient's wound bed actually showed signs of good granulation and epithelization at this point all things considered. Still his ABIs are very low is about 50% on each leg. With that being said he does have a follow-up with vascular in about 2 weeks maybe 2-1/2 weeks where he is going to have the angiogram for the right leg and then subsequently the left leg will be to follow. Nonetheless I think this is can make a big difference in getting the wounds healed to be honest. Fortunately I do not see any signs of active infection at this point. 11/9; patient arrives in clinic with 2 new superficial wounds on the right leg and right ankle. Both of these and the same condition of the original wound. In terms of the wound on his plantar foot about the same. We have been using collagen. His arterial review/angiogram is due for November 21 05-15-2022 upon evaluation today patient appears to be doing well currently in regard to his wounds all things considered. He actually has an appointment upcoming with the vascular surgeon where he is going to be undergoing intervention. Obviously I think this could be beneficial as far as helping this right leg and foot to heal it also allows to be able to put compression on which I think is much needed. He voiced understanding. That is on 21 November.[name] [name], [name] ([id number]) [id number] Page 7 of 8 05-29-2022 upon evaluation today patient appears to be doing significantly better in regard to his leg in general he seems to have much better blood flow compared to previous. Fortunately there does not appear to be any signs of infection locally nor systemically at this time. The good news is post his intervention procedure with Dr. Delana Meyer he only has 10% residual blockage in the right leg and to be honest this already looks much better  from a color perspective I am very pleased with where we stand. Objective Constitutional Well-nourished and well-hydrated in no acute distress. Vitals Time Taken: 10:46 AM, Height: 74 in, Weight: 244 lbs, BMI: 31.3, Temperature: 97.74 F, Pulse: 59 bpm, Respiratory Rate: 16 breaths/min, Blood Pressure: 121/59 mmHg. Respiratory normal breathing without difficulty. Psychiatric this patient is able to make decisions and demonstrates good insight into disease process. Alert and Oriented x 3. pleasant and cooperative. General Notes: Patient's wound bed actually showed signs of good granulation epithelization I did perform some debridement in regard to the foot to clearway some of the callus and necrotic debris so this hopefully has a chance to heal more open with the improved blood flow at this  point he should do much better as far as that is concerned. Integumentary (Hair, Skin) Wound #10 status is Open. Original cause of wound was Gradually Appeared. The date acquired was: 05/03/2022. The wound has been in treatment 3 weeks. The wound is located on the Right,Distal,Lateral Lower Leg. The wound measures 1.2cm length x 0.6cm width x 0.1cm depth; 0.565cm^2 area and 0.057cm^3 volume. There is Fat Layer (Subcutaneous Tissue) exposed. There is a medium amount of serous drainage noted. There is medium (34-66%) red, friable granulation within the wound bed. Wound #2 status is Open. Original cause of wound was Gradually Appeared. The date acquired was: 12/28/2020. The wound has been in treatment 19 weeks. The wound is located on the [place]. The wound measures 0.5cm length x 0.2cm width x 0.2cm depth; 0.079cm^2 area and 0.016cm^3 volume. There is Fat Layer (Subcutaneous Tissue) exposed. There is a medium amount of serosanguineous drainage noted. There is small (1-33%) red granulation within the wound bed. There is no necrotic tissue within the wound bed. Wound #9 status is Open. Original cause of  wound was Skin T ear/Laceration. The date acquired was: 04/09/2022. The wound has been in treatment 7 weeks. The wound is located on the Right,Lateral,Superior Lower Leg. The wound measures 1.5cm length x 1cm width x 0.1cm depth; 1.178cm^2 area and 0.118cm^3 volume. There is Fat Layer (Subcutaneous Tissue) exposed. There is a medium amount of serosanguineous drainage noted. The wound margin is flat and intact. There is no granulation within the wound bed. There is no necrotic tissue within the wound bed. Assessment Active Problems ICD-10 Chronic venous hypertension (idiopathic) with ulcer and inflammation of bilateral lower extremity Non-pressure chronic ulcer of other part of right lower leg with fat layer exposed Non-pressure chronic ulcer of other part of left lower leg with fat layer exposed Other idiopathic peripheral autonomic neuropathy Non-pressure chronic ulcer of other part of left foot with fat layer exposed Non-pressure chronic ulcer of other part of right foot with fat layer exposed Essential (primary) hypertension Chronic combined systolic (congestive) and diastolic (congestive) heart failure Paroxysmal atrial fibrillation Dilated cardiomyopathy Long term (current) use of anticoagulants Atherosclerotic heart disease of native coronary artery without angina pectoris Chronic kidney disease, stage 3b Plan 1. Based on what I am seeing I do believe the patient would benefit from continued and ongoing treatment with the silver collagen to the foot. Now that he has good blood flow think this has any better chance of healing. 2. I am good recommend as well the patient should continue to monitor for any evidence of infection or worsening. Obviously if anything changes he knows in contact the office and let me know. Otherwise my hope is that he will do much better now that we can to be able to wrap his right leg to get this under better control. We will see patient back for reevaluation  in 1 week here in the clinic. If anything worsens or changes patient will contact our office for additional recommendations. [name], [name] ([id number]) [id number] Page 8 of 8 Electronic Signature(s) Signed: 05/29/2022 11:20:22 AM By: Worthy Keeler PA-C Entered By: Worthy Keeler on 05/29/2022 11:20:22 -------------------------------------------------------------------------------- SuperBill Details Patient Name: Date of Service: [name] [facility] [name]. 05/29/2022 Medical Record Number: [id number] Patient Account Number: [id number] Date of Birth/Sex: Treating RN: 13-May-1941 (81 y.o. [name] Primary Care Provider: [name] Other Clinician: Referring Provider: Treating Provider/Extender: [name] in Treatment: 19 Diagnosis Coding ICD-10 Codes Code Description [phone]  Chronic venous hypertension (idiopathic) with ulcer and inflammation of bilateral lower extremity L97.812 Non-pressure chronic ulcer of other part of right lower leg with fat layer exposed L97.822 Non-pressure chronic ulcer of other part of left lower leg with fat layer exposed G90.09 Other idiopathic peripheral autonomic neuropathy L97.522 Non-pressure chronic ulcer of other part of left foot with fat layer exposed L97.512 Non-pressure chronic ulcer of other part of right foot with fat layer exposed I10 Essential (primary) hypertension I50.42 Chronic combined systolic (congestive) and diastolic (congestive) heart failure I48.0 Paroxysmal atrial fibrillation I42.0 Dilated cardiomyopathy Z79.01 Long term (current) use of anticoagulants I25.10 Atherosclerotic heart disease of native coronary artery without angina pectoris N18.32 Chronic kidney disease, stage 3b Facility Procedures : CPT4 Code: [id number] Description: 99214 - WOUND CARE VISIT-LEV 4 EST PT Modifier: Quantity: 1 Physician Procedures : CPT4 Code Description Modifier [id number] - WC PHYS  LEVEL 3 - EST PT ICD-10 Diagnosis Description I87.333 Chronic venous hypertension (idiopathic) with ulcer and inflammation of bilateral lower extremity L97.812 Non-pressure chronic ulcer of other  part of right lower leg with fat layer exposed L97.822 Non-pressure chronic ulcer of other part of left lower leg with fat layer exposed G90.09 Other idiopathic peripheral autonomic neuropathy Quantity: 1 Electronic Signature(s) Signed: 05/29/2022 4:36:28 PM By: Rosalio Loud MSN RN CNS WTA Signed: 05/29/2022 5:46:39 PM By: Worthy Keeler PA-C Previous Signature: 05/29/2022 11:20:52 AM Version By: Worthy Keeler PA-C Entered By: Rosalio Loud on 05/29/2022 16:36:28

## 2022-05-29 NOTE — Progress Notes (Addendum)
SHYLO, DILLENBECK ([id number]) [id number] Page 1 of 12 Visit Report for 05/29/2022 Arrival Information Details Patient Name: Date of Service: [name] [name]. 05/29/2022 10:00 A M Medical Record Number: [id number] Patient Account Number: [id number] Date of Birth/Sex: Treating RN: 1940/10/10 (81 y.o. [name] Primary Care Staci [name]: [name] Other Clinician: Referring [name]: Treating [name]/Extender: [name] in Treatment: 19 Visit Information History Since Last Visit Added or deleted any medications: No Patient Arrived: [name] Any new allergies or adverse reactions: No Arrival Time: 10:42 Signs or symptoms of abuse/neglect since last visito No Accompanied By: self Hospitalized since last visit: No Transfer Assistance: None Pain Present Now: No Patient Identification Verified: Yes Secondary Verification Process Completed: Yes Patient Requires Transmission-Based Precautions: No Patient Has Alerts: Yes Patient Alerts: ABI R 0.69 L 0.66 TBI R 0.41 L 0.16 Electronic Signature([name]) Signed: 05/29/2022 4:34:22 PM By: [name] Loud MSN RN CNS WTA Entered By: [name] on 05/29/2022 16:34:22 -------------------------------------------------------------------------------- Clinic Level of Care Assessment Details Patient Name: Date of Service: [name] [name]. 05/29/2022 10:00 A M Medical Record Number: [id number] Patient Account Number: [id number] Date of Birth/Sex: Treating RN: [birth year] (81 y.o. [name] Primary Care [name]: [name] Other Clinician: Referring [name]: Treating [name]/Extender: [name] in Treatment: 19 Clinic Level of Care Assessment Items TOOL 4 Quantity Score X- 1 0 Use when only an EandM is performed on FOLLOW-UP visit ASSESSMENTS - Nursing Assessment / Reassessment X- 1 10 Reassessment of Co-morbidities (includes updates in patient status) X- 1  5 Reassessment of Adherence to Treatment Plan ASSESSMENTS - Wound and Skin A ssessment / Reassessment X - Simple Wound Assessment / Reassessment - one wound 1 5 [name], [name] ([id number]) [id number] Page 2 of 12 []  - 0 Complex Wound Assessment / Reassessment - multiple wounds []  - 0 Dermatologic / Skin Assessment (not related to wound area) ASSESSMENTS - Focused Assessment []  - 0 Circumferential Edema Measurements - multi extremities []  - 0 Nutritional Assessment / Counseling / Intervention []  - 0 Lower Extremity Assessment (monofilament, tuning fork, pulses) []  - 0 Peripheral Arterial Disease Assessment (using hand held doppler) ASSESSMENTS - Ostomy and/or Continence Assessment and Care []  - 0 Incontinence Assessment and Management []  - 0 Ostomy Care Assessment and Management (repouching, etc.) PROCESS - Coordination of Care []  - 0 Simple Patient / Family Education for ongoing care []  - 0 Complex (extensive) Patient / Family Education for ongoing care X- 1 10 Staff obtains Programmer, systems, Records, T Results / Process Orders est []  - 0 Staff telephones HHA, Nursing Homes / Clarify orders / etc []  - 0 Routine Transfer to another Facility (non-emergent condition) []  - 0 Routine Hospital Admission (non-emergent condition) []  - 0 New Admissions / Biomedical engineer / Ordering NPWT Apligraf, etc. , []  - 0 Emergency Hospital Admission (emergent condition) []  - 0 Simple Discharge Coordination X- 1 15 Complex (extensive) Discharge Coordination PROCESS - Special Needs []  - 0 Pediatric / Minor Patient Management []  - 0 Isolation Patient Management []  - 0 Hearing / Language / Visual special needs []  - 0 Assessment of Community assistance (transportation, [name]/C planning, etc.) []  - 0 Additional assistance / Altered mentation []  - 0 Support Surface([name]) Assessment (bed, cushion, seat, etc.) INTERVENTIONS - Wound Cleansing / Measurement []  -  0 Simple Wound Cleansing - one wound X- 3 5 Complex Wound Cleansing - multiple wounds []  - 0 Wound Imaging (photographs - any number of wounds) []  - 0  Wound Tracing (instead of photographs) []  - 0 Simple Wound Measurement - one wound X- 3 5 Complex Wound Measurement - multiple wounds INTERVENTIONS - Wound Dressings []  - 0 Small Wound Dressing one or multiple wounds X- 3 15 Medium Wound Dressing one or multiple wounds []  - 0 Large Wound Dressing one or multiple wounds []  - 0 Application of Medications - topical []  - 0 Application of Medications - injection INTERVENTIONS - Miscellaneous []  - 0 External ear exam []  - 0 Specimen Collection (cultures, biopsies, blood, body fluids, etc.) []  - 0 Specimen([name]) / Culture([name]) sent or taken to Lab for analysis [name], [name] ([id number]) [id number] Page 3 of 12 []  - 0 Patient Transfer (multiple staff / Civil Service fast streamer / Similar devices) []  - 0 Simple Staple / Suture removal (25 or less) []  - 0 Complex Staple / Suture removal (26 or more) []  - 0 Hypo / Hyperglycemic Management (close monitor of Blood Glucose) []  - 0 Ankle / Brachial Index (ABI) - do not check if billed separately X- 1 5 Vital Signs Has the patient been seen at the hospital within the last three years: Yes Total Score: 125 Level Of Care: New/Established - Level 4 Electronic Signature([name]) Signed: 05/29/2022 4:40:58 PM By: [name] Loud MSN RN CNS WTA Entered By: [name] on 05/29/2022 16:36:16 -------------------------------------------------------------------------------- Encounter Discharge Information Details Patient Name: Date of Service: [name], [name]. 05/29/2022 10:00 A M Medical Record Number: [id number] Patient Account Number: [id number] Date of Birth/Sex: Treating RN: 22-Apr-1941 (81 y.o. [name] Primary Care [name]: [name] Other Clinician: Referring [name]: Treating [name]/Extender: [name] in Treatment: 19 Encounter Discharge Information Items Discharge Condition: Stable Ambulatory Status: Ambulatory Discharge Destination: Home Transportation: Private Auto Accompanied By: self Schedule Follow-up Appointment: Yes Clinical Summary of Care: Electronic Signature([name]) Signed: 05/29/2022 4:37:28 PM By: [name] Loud MSN RN CNS WTA Entered By: [name] on 05/29/2022 16:37:27 -------------------------------------------------------------------------------- Lower Extremity Assessment Details Patient Name: Date of Service: [name] [name]. 05/29/2022 10:00 A M Medical Record Number: [id number] Patient Account Number: [id number] Date of Birth/Sex: Treating RN: 1941/02/27 (81 y.o. [name] Primary Care Miku Udall: [name] Other Clinician: SADIQ, MCCAULEY [name] ([id number]) [id number] Page 4 of 12 Referring Endora Teresi: Treating Sidney Kann/Extender: [name] in Treatment: 19 Edema Assessment Assessed: [Left: No] [Right: No] [Left: Edema] [Right: :] Calf Left: Right: Point of Measurement: 31 cm From Medial Instep 40 cm Ankle Left: Right: Point of Measurement: 11 cm From Medial Instep 28 cm Vascular Assessment Pulses: Dorsalis Pedis Palpable: [Right:Yes] Electronic Signature([name]) Signed: 05/29/2022 4:34:39 PM By: [name] Loud MSN RN CNS WTA Entered By: [name] on 05/29/2022 16:34:39 -------------------------------------------------------------------------------- Multi Wound Chart Details Patient Name: Date of Service: [name], [name]. 05/29/2022 10:00 A M Medical Record Number: [id number] Patient Account Number: [id number] Date of Birth/Sex: Treating RN: [birth year] (81 y.o. [name] Primary Care Ilario Dhaliwal: [name] Other Clinician: Referring Lue Sykora: Treating Ananda Caya/Extender: [name] in Treatment: 19 Vital Signs Height(in): 74 Pulse(bpm):  43 Weight(lbs): 462 Blood Pressure(mmHg): 121/59 Body Mass Index(BMI): 31.3 Temperature(F): 97.74 Respiratory Rate(breaths/min): 16 [10:Photos:] Right, Distal, Lateral Lower Leg Right, Plantar Foot Right, Lateral, Superior Lower Leg Wound Location: Gradually Appeared Gradually Appeared Skin Tear/Laceration Wounding Event: Abrasion Neuropathic Ulcer-Non Diabetic Skin Tear Primary Etiology: Congestive Heart Failure, Congestive Heart Failure, Congestive Heart Failure, Comorbid History: Hypertension Hypertension Hypertension 05/03/2022 12/28/2020 04/09/2022 Date Acquired: [name], [name] ([id number]) [id number] Page 5 of 12 3  19 7 Weeks of Treatment: Open Open Open Wound Status: No No No Wound Recurrence: 1.2x0.6x0.1 0.5x0.2x0.2 1.5x1x0.1 Measurements L x W x [name] (cm) 0.565 0.079 1.178 A (cm) : rea 0.057 0.016 0.118 Volume (cm) : 76.00% 0.00% -130.50% % Reduction in Area: 75.80% 33.30% -131.40% % Reduction in Volume: Partial Thickness Full Thickness Without Exposed Partial Thickness Classification: Support Structures Medium Medium Medium Exudate Amount: Serous Serosanguineous Serosanguineous Exudate Type: amber red, brown red, brown Exudate Color: N/A N/A Flat and Intact Wound Margin: Medium (34-66%) Small (1-33%) None Present (0%) Granulation Amount: Red, Friable Red N/A Granulation Quality: N/A None Present (0%) None Present (0%) Necrotic Amount: Fat Layer (Subcutaneous Tissue): Yes Fat Layer (Subcutaneous Tissue): Yes Fat Layer (Subcutaneous Tissue): Yes Exposed Structures: Fascia: No Fascia: No Fascia: No Tendon: No Tendon: No Tendon: No Muscle: No Muscle: No Muscle: No Joint: No Joint: No Joint: No Bone: No Bone: No Bone: No N/A Medium (34-66%) None Epithelialization: Treatment Notes Electronic Signature([name]) Signed: 05/29/2022 4:34:54 PM By: [name] Loud MSN RN CNS WTA Entered By: [name] on 05/29/2022  16:34:54 -------------------------------------------------------------------------------- Multi-Disciplinary Care Plan Details Patient Name: Date of Service: [name], [name]. 05/29/2022 10:00 A M Medical Record Number: [id number] Patient Account Number: [id number] Date of Birth/Sex: Treating RN: 1941-06-11 (81 y.o. [name] Primary Care Juron Vorhees: [name] Other Clinician: Referring Lamoyne Palencia: Treating Bethanee Redondo/Extender: [name] in Treatment: 19 Active Inactive Necrotic Tissue Nursing Diagnoses: Impaired tissue integrity related to necrotic/devitalized tissue Knowledge deficit related to management of necrotic/devitalized tissue Goals: Necrotic/devitalized tissue will be minimized in the wound bed Date Initiated: 03/31/2022 Target Resolution Date: 03/31/2022 Goal Status: Active Patient/caregiver will verbalize understanding of reason and process for debridement of necrotic tissue Date Initiated: 03/31/2022 Target Resolution Date: 03/31/2022 Goal Status: Active Interventions: Assess patient pain level pre-, during and post procedure and prior to discharge Provide education on necrotic tissue and debridement process Treatment Activities: Excisional debridement : 03/31/2022 [name], [name] ([id number]) [id number] Page 6 of 12 Notes: Wound/Skin Impairment Nursing Diagnoses: Knowledge deficit related to ulceration/compromised skin integrity Goals: Patient/caregiver will verbalize understanding of skin care regimen Date Initiated: 01/16/2022 Target Resolution Date: 02/16/2022 Goal Status: Active Ulcer/skin breakdown will have a volume reduction of 30% by week 4 Date Initiated: 01/16/2022 Target Resolution Date: 02/16/2022 Goal Status: Active Ulcer/skin breakdown will have a volume reduction of 50% by week 8 Date Initiated: 01/16/2022 Target Resolution Date: 03/19/2022 Goal Status: Active Ulcer/skin breakdown will have a  volume reduction of 80% by week 12 Date Initiated: 01/16/2022 Target Resolution Date: 04/18/2022 Goal Status: Active Ulcer/skin breakdown will heal within 14 weeks Date Initiated: 01/16/2022 Target Resolution Date: 05/19/2022 Goal Status: Active Interventions: Assess patient/caregiver ability to obtain necessary supplies Assess patient/caregiver ability to perform ulcer/skin care regimen upon admission and as needed Assess ulceration([name]) every visit Notes: Electronic Signature([name]) Signed: 05/29/2022 4:34:44 PM By: [name] Loud MSN RN CNS WTA Entered By: [name] on 05/29/2022 16:34:44 -------------------------------------------------------------------------------- Pain Assessment Details Patient Name: Date of Service: [name] [name]. 05/29/2022 10:00 A M Medical Record Number: [id number] Patient Account Number: [id number] Date of Birth/Sex: Treating RN: Apr 22, 1941 (81 y.o. [name] Primary Care Tiea Manninen: [name] Other Clinician: Referring Dionicia Cerritos: Treating Aniyiah Zell/Extender: [name] in Treatment: 19 Active Problems Location of Pain Severity and Description of Pain Patient Has Paino No Site Locations [name], [name] ([id number]) [id number] Page 7 of 12 Pain Management and Medication Current Pain Management: Electronic Signature([name]) Signed: 05/29/2022 4:40:58 PM By:  [name] Loud MSN RN CNS WTA Entered By: [name] on 05/29/2022 16:34:30 -------------------------------------------------------------------------------- Patient/Caregiver Education Details Patient Name: Date of Service: [name] [facility] [name]. 11/30/2023andnbsp10:00 A M Medical Record Number: [id number] Patient Account Number: [id number] Date of Birth/Gender: Treating RN: February 10, 1941 (81 y.o. [name] Primary Care Physician: [name] Other Clinician: Referring Physician: Treating Physician/Extender: [name] in  Treatment: 19 Education Assessment Education Provided To: Patient Education Topics Provided Wound/Skin Impairment: Handouts: Caring for Your Ulcer Methods: Explain/Verbal Responses: State content correctly Electronic Signature([name]) Signed: 05/29/2022 4:40:58 PM By: [name] Loud MSN RN CNS WTA Entered By: [name] on 05/29/2022 16:36:40 [name], [name] ([id number]) [id number] Page 8 of 12 -------------------------------------------------------------------------------- Wound Assessment Details Patient Name: Date of Service: [name] [facility] [name]. 05/29/2022 10:00 A M Medical Record Number: [id number] Patient Account Number: [id number] Date of Birth/Sex: Treating RN: [birth year] (81 y.o. [name] Primary Care Lilienne Weins: [name] Other Clinician: Referring Keanu Lesniak: Treating Corinthia Helmers/Extender: [name] in Treatment: 19 Wound Status Wound Number: 10 Primary Etiology: Abrasion Wound Location: Right, Distal, Lateral Lower Leg Wound Status: Open Wounding Event: Gradually Appeared Comorbid History: Congestive Heart Failure, Hypertension Date Acquired: 05/03/2022 Weeks Of Treatment: 3 Clustered Wound: No Photos Wound Measurements Length: (cm) 1.2 Width: (cm) 0.6 Depth: (cm) 0.1 Area: (cm) 0.565 Volume: (cm) 0.057 % Reduction in Area: 76% % Reduction in Volume: 75.8% Wound Description Classification: Partial Thickness Exudate Amount: Medium Exudate Type: Serous Exudate Color: amber Foul Odor After Cleansing: No Slough/Fibrino No Wound Bed Granulation Amount: Medium (34-66%) Exposed Structure Granulation Quality: Red, Friable Fascia Exposed: No Fat Layer (Subcutaneous Tissue) Exposed: Yes Tendon Exposed: No Muscle Exposed: No Joint Exposed: No Bone Exposed: No Treatment Notes Wound #10 (Lower Leg) Wound Laterality: Right, Lateral, Distal Cleanser Peri-Wound Care Topical [name], [name] ([id number])  [id number] Page 9 of 12 Primary Dressing Secondary Dressing Secured With Compression Wrap Compression Stockings Add-Ons Electronic Signature([name]) Signed: 05/29/2022 4:40:58 PM By: [name] Loud MSN RN CNS WTA Entered By: [name] on 05/29/2022 11:01:22 -------------------------------------------------------------------------------- Wound Assessment Details Patient Name: Date of Service: [name] [name]. 05/29/2022 10:00 A M Medical Record Number: [id number] Patient Account Number: [id number] Date of Birth/Sex: Treating RN: 1940-09-18 (81 y.o. [name] Primary Care Tyrie Porzio: [name] Other Clinician: Referring Debar Plate: Treating Lani Havlik/Extender: [name] in Treatment: 19 Wound Status Wound Number: 2 Primary Etiology: Neuropathic Ulcer-Non Diabetic Wound Location: Right, Plantar Foot Wound Status: Open Wounding Event: Gradually Appeared Comorbid History: Congestive Heart Failure, Hypertension Date Acquired: 12/28/2020 Weeks Of Treatment: 19 Clustered Wound: No Photos Wound Measurements Length: (cm) 0.5 Width: (cm) 0.2 Depth: (cm) 0.2 Area: (cm) 0.079 Volume: (cm) 0.016 % Reduction in Area: 0% % Reduction in Volume: 33.3% Epithelialization: Medium (34-66%) Wound Description Classification: Full Thickness Without Exposed Support Structures Exudate Amount: Medium Exudate Type: Serosanguineous Exudate Color: red, brown Foul Odor After Cleansing: No Slough/Fibrino No Wound Bed Granulation Amount: Small (1-33%) Exposed Structure [name], [name] ([id number]) [id number] Page 10 of 12 Granulation Quality: Red Fascia Exposed: No Necrotic Amount: None Present (0%) Fat Layer (Subcutaneous Tissue) Exposed: Yes Tendon Exposed: No Muscle Exposed: No Joint Exposed: No Bone Exposed: No Treatment Notes Wound #2 (Foot) Wound Laterality: Plantar, Right Cleanser Wound  Cleanser Discharge Instruction: Wash your hands with soap and water. Remove old dressing, discard into plastic bag and place into trash. Cleanse the wound with Wound Cleanser prior to applying a clean dressing using gauze sponges, not tissues or  cotton balls. Do not scrub or use excessive force. Pat dry using gauze sponges, not tissue or cotton balls. Peri-Wound Care Topical Primary Dressing Prisma 4.34 (in) Discharge Instruction: Moisten w/normal saline or sterile water; Cover wound as directed. Do not remove from wound bed. Secondary Dressing Zetuvit Plus 4x8 (in/in) Secured With Tubigrip Size E, 3.5x10 (in/yds) Discharge Instruction: Apply 3 Tubigrip E 3-finger-widths below knee to base of toes to secure dressing and/or for swelling. Compression Wrap Compression Stockings Add-Ons Electronic Signature([name]) Signed: 05/29/2022 4:40:58 PM By: [name] Loud MSN RN CNS WTA Entered By: [name] on 05/29/2022 11:00:00 -------------------------------------------------------------------------------- Wound Assessment Details Patient Name: Date of Service: [name] [facility] [name]. 05/29/2022 10:00 A M Medical Record Number: [id number] Patient Account Number: [id number] Date of Birth/Sex: Treating RN: 1941/06/23 (81 y.o. [name] Primary Care Gracia Saggese: [name] Other Clinician: Referring Staceyann Knouff: Treating Jamine Highfill/Extender: [name] Weeks in Treatment: 19 Wound Status Wound Number: 9 Primary Etiology: Skin Tear Wound Location: Right, Lateral, Superior Lower Leg Wound Status: Open Wounding Event: Skin Tear/Laceration Comorbid History: Congestive Heart Failure, Hypertension Date Acquired: 04/09/2022 Weeks Of Treatment: 7 Clustered Wound: No Photos [name], [name] ([id number]) [id number] Page 11 of 12 Wound Measurements Length: (cm) 1.5 Width: (cm) 1 Depth: (cm) 0.1 Area: (cm) 1.178 Volume: (cm) 0.118 % Reduction in Area:  -130.5% % Reduction in Volume: -131.4% Epithelialization: None Wound Description Classification: Partial Thickness Wound Margin: Flat and Intact Exudate Amount: Medium Exudate Type: Serosanguineous Exudate Color: red, brown Foul Odor After Cleansing: No Slough/Fibrino No Wound Bed Granulation Amount: None Present (0%) Exposed Structure Necrotic Amount: None Present (0%) Fascia Exposed: No Fat Layer (Subcutaneous Tissue) Exposed: Yes Tendon Exposed: No Muscle Exposed: No Joint Exposed: No Bone Exposed: No Treatment Notes Wound #9 (Lower Leg) Wound Laterality: Right, Lateral, Superior Cleanser Wound Cleanser Discharge Instruction: Wash your hands with soap and water. Remove old dressing, discard into plastic bag and place into trash. Cleanse the wound with Wound Cleanser prior to applying a clean dressing using gauze sponges, not tissues or cotton balls. Do not scrub or use excessive force. Pat dry using gauze sponges, not tissue or cotton balls. Peri-Wound Care Topical Primary Dressing Secondary Dressing ABD Pad 5x9 (in/in) Discharge Instruction: Cover with ABD pad Secured With Conform 4'' - Conforming Stretch Gauze Bandage 4x75 (in/in) Discharge Instruction: Apply as directed Tubigrip Size E, 3.5x10 (in/yds) Discharge Instruction: Apply 3 Tubigrip E 3-finger-widths below knee to base of toes to secure dressing and/or for swelling. Compression Wrap Compression Stockings Add-Ons Electronic Signature([name]) Signed: 05/29/2022 4:40:58 PM By: [name] Loud MSN RN CNS WTA Entered By: [name] on 05/29/2022 11:01:08 [name] ([id number]) [id number] Page 12 of 12 -------------------------------------------------------------------------------- Vitals Details Patient Name: Date of Service: [name] [facility] [name]. 05/29/2022 10:00 A M Medical Record Number: [id number] Patient Account Number: [id number] Date of Birth/Sex: Treating RN: 1940-07-17 (81  y.o. [name] Primary Care Keyera Hattabaugh: [name] Other Clinician: Referring Shavona Gunderman: Treating Shawny Borkowski/Extender: [name] in Treatment: 19 Vital Signs Time Taken: 10:46 Temperature (F): 97.74 Height (in): 74 Pulse (bpm): 59 Weight (lbs): 244 Respiratory Rate (breaths/min): 16 Body Mass Index (BMI): 31.3 Blood Pressure (mmHg): 121/59 Reference Range: 80 - 120 mg / dl Electronic Signature([name]) Signed: 05/29/2022 4:34:26 PM By: [name] Loud MSN RN CNS WTA Entered By: [name] on 05/29/2022 16:34:26

## 2022-06-05 ENCOUNTER — Encounter: Payer: PPO | Attending: Physician Assistant | Admitting: Physician Assistant

## 2022-06-05 DIAGNOSIS — I2510 Atherosclerotic heart disease of native coronary artery without angina pectoris: Secondary | ICD-10-CM | POA: Insufficient documentation

## 2022-06-05 DIAGNOSIS — Z79899 Other long term (current) drug therapy: Secondary | ICD-10-CM | POA: Insufficient documentation

## 2022-06-05 DIAGNOSIS — I420 Dilated cardiomyopathy: Secondary | ICD-10-CM | POA: Insufficient documentation

## 2022-06-05 DIAGNOSIS — L97822 Non-pressure chronic ulcer of other part of left lower leg with fat layer exposed: Secondary | ICD-10-CM | POA: Insufficient documentation

## 2022-06-05 DIAGNOSIS — I130 Hypertensive heart and chronic kidney disease with heart failure and stage 1 through stage 4 chronic kidney disease, or unspecified chronic kidney disease: Secondary | ICD-10-CM | POA: Insufficient documentation

## 2022-06-05 DIAGNOSIS — Z7901 Long term (current) use of anticoagulants: Secondary | ICD-10-CM | POA: Diagnosis not present

## 2022-06-05 DIAGNOSIS — I87333 Chronic venous hypertension (idiopathic) with ulcer and inflammation of bilateral lower extremity: Secondary | ICD-10-CM | POA: Diagnosis not present

## 2022-06-05 DIAGNOSIS — L97812 Non-pressure chronic ulcer of other part of right lower leg with fat layer exposed: Secondary | ICD-10-CM | POA: Insufficient documentation

## 2022-06-05 DIAGNOSIS — I5042 Chronic combined systolic (congestive) and diastolic (congestive) heart failure: Secondary | ICD-10-CM | POA: Insufficient documentation

## 2022-06-05 DIAGNOSIS — L97522 Non-pressure chronic ulcer of other part of left foot with fat layer exposed: Secondary | ICD-10-CM | POA: Insufficient documentation

## 2022-06-05 DIAGNOSIS — L97512 Non-pressure chronic ulcer of other part of right foot with fat layer exposed: Secondary | ICD-10-CM | POA: Insufficient documentation

## 2022-06-05 DIAGNOSIS — G9009 Other idiopathic peripheral autonomic neuropathy: Secondary | ICD-10-CM | POA: Insufficient documentation

## 2022-06-05 DIAGNOSIS — I480 Paroxysmal atrial fibrillation: Secondary | ICD-10-CM | POA: Insufficient documentation

## 2022-06-05 DIAGNOSIS — N1832 Chronic kidney disease, stage 3b: Secondary | ICD-10-CM | POA: Diagnosis not present

## 2022-06-05 NOTE — Progress Notes (Addendum)
[name], [name] ([id number]) [id number] Page 1 of 9 Visit Report for 06/05/2022 Chief Complaint Document Details Patient Name: Date of Service: [name] [facility] D. 06/05/2022 9:15 A M Medical Record Number: [id number] Patient Account Number: [id number] Date of Birth/Sex: Treating RN: 13-Dec-1940 (81 y.o. [name] Primary Care Provider: [name] Other Clinician: Referring Provider: Treating Provider/Extender: [name] in Treatment: 20 Information Obtained from: Patient Chief Complaint Multiple LE Ulcers Electronic Signature(s) Signed: 06/05/2022 9:32:12 AM By: Worthy Keeler PA-C Entered By: Worthy Keeler on 06/05/2022 09:32:12 -------------------------------------------------------------------------------- Debridement Details Patient Name: Date of Service: [name] [facility] D. 06/05/2022 9:15 A M Medical Record Number: [id number] Patient Account Number: [id number] Date of Birth/Sex: Treating RN: 1940-09-13 (81 y.o. [name] Primary Care Provider: [name] Other Clinician: Referring Provider: Treating Provider/Extender: [name] in Treatment: 20 Debridement Performed for Assessment: Wound #2 Right,Plantar Foot Performed By: Physician Tommie Sams., PA-C Debridement Type: Debridement Level of Consciousness (Pre-procedure): Awake and Alert Pre-procedure Verification/Time Out No Taken: T Area Debrided (L x W): otal 0.8 (cm) x 0.2 (cm) = 0.16 (cm) Tissue and other material debrided: Non-Viable, Callus Level: Non-Viable Tissue Debridement Description: Selective/Open Wound Instrument: Curette Bleeding: Minimum Hemostasis Achieved: Pressure Response to Treatment: Procedure was tolerated well Level of Consciousness (Post- Awake and Alert procedure): Post Debridement Measurements of Total Wound Molder, Tevis D ([id number]) [id number] Page 2 of 9 Length:  (cm) 0.8 Width: (cm) 0.2 Depth: (cm) 0.2 Volume: (cm) 0.025 Character of Wound/Ulcer Post Debridement: Stable Post Procedure Diagnosis Same as Pre-procedure Electronic Signature(s) Signed: 06/05/2022 4:35:46 PM By: Worthy Keeler PA-C Signed: 06/06/2022 1:42:10 PM By: Rosalio Loud MSN RN CNS WTA Entered By: Rosalio Loud on 06/05/2022 09:59:19 -------------------------------------------------------------------------------- HPI Details Patient Name: Date of Service: [name], [name] D. 06/05/2022 9:15 A M Medical Record Number: [id number] Patient Account Number: [id number] Date of Birth/Sex: Treating RN: 1941/06/25 (81 y.o. [name] Primary Care Provider: [name] Other Clinician: Referring Provider: Treating Provider/Extender: [name] in Treatment: 20 History of Present Illness HPI Description: 01-16-2022 upon evaluation today patient presents for initial inspection here in the clinic concerning issues that he has been having with wounds over the bilateral lower extremities and bilateral feet. Fortunately there does not appear to be any signs of significant infection at this point which is great news. Unfortunately his ABIs were registering at 0.49 on the left and 0.57 on the right here in the clinic on the screening today. With that being said I do think that it may possibly be true that we need to get him to formal arterial studies in order to see how things really are showing up. And the patient's not in disagreement with this for that reason we are going to make that referral as well. With that being said he does have some significant past medical history items of note detailed below. Patient does have chronic kidney disease stage IIIb, coronary artery disease, long-term use of anticoagulant therapy, dilated cardiomyopathy. Proximal atrial fibrillation, he is on Xarelto long-term. He also has congestive heart failure, hypertension, peripheral  neuropathy not related to diabetes as he is only prediabetic with a hemoglobin A1c of 5.8, and chronic venous hypertension. He has recently seen Dr. [name] show his cardiologist who did discontinue the hydrochlorothiazide and the Lasix and felt that the patient was retaining more fluid than he should be. For that reason he was started on furosemide 20 mg  twice daily. 7/27; patient with bilateral lower extremity wounds. These are largely venous although he had very poor ABIs on screening test here. He has an appointment apparently tomorrow at vein and vascular for arterial studies. He was not too keen on the latter although I think I emphasized the need to do this both in terms of dealing with the current wounds and being prepared for further issues down the road. We have been using silver alginate on the wounds and Tubigrip's edema control is better per our intake nurse 02-06-2022 upon evaluation today patient appears to be doing well with regard to his wound. Fortunately there is no signs of infection he is actually healing for the most part on the bilateral feet. His legs are also doing much better though he has 1 new area that popped up on the right leg. Fortunately there is no evidence of active infection of note his ABIs and TBI's were poor on the vascular screening on the 31st with the left being worse than the right. For that reason I am going to recommend he still continue to follow-up with vascular to discuss angioplasty/stenting. [id number] upon evaluation today patient appears to be doing well currently in regard to his wound on the leg although he has several other blisters that are showing up at this point. I do believe that we need to try to do something to get his swelling under control this could be of utmost importance as far as getting this healed is concerned. 03-06-2022 upon evaluation today patient appears to be doing well currently in regard to his wounds. In fact the legs are drying up  and seem to be healing quite nicely the lateral wounds are still weeping a little bit about the medial side actually appears to be pretty much dry and closed. The plantar aspect of his foot still is draining a bit but again it seems to be doing much better which is great news.[name] 03-13-2022 upon evaluation patient appears to be almost completely healed. His leg is doing excellent and his foot is just about closed. With that being said he did see vascular and they actually discussed the possibility of doing an arteriogram/angiogram in order to see if anything needed to be the balloons were stented open. With that being said he decided not to do that since things are healing so well right now and he is going to consider that for future they plan to see him back in November. 03-21-2022 upon evaluation today patient appears to be doing a little worse in regard to his wound on the foot. The wound keeps seeming to pull together unfortunately which is causing a trapped some fluid is just not healing quite as well as would like to see. With that being said I am going to try to remove a bit more of the overgrowth callus tissue today and hopefully that can help get this to seal up much more effectively and quickly. Fortunately I see no signs of infection of the foot although the leg on the right does seem to show some signs of erythema today I do think we need to address that currently. This looks to be cellulitis. LATRON, RIBAS ([id number]) [id number] Page 3 of 9 03-31-2022 upon evaluation today patient appears to be doing better in regard to the cellulitis I am pleased in that regard he is still taking the antibiotics. Fortunately I do not see any evidence of active infection locally or systemically at this time which is great news.  No fevers, chills, nausea, vomiting, or diarrhea. 04-10-2022 upon evaluation today patient appears to be doing well currently in regard to his foot  ulcer I think were very close to complete resolution which is great news. Fortunately I do not see any signs of active infection locally or systemically at this time. No fevers, chills, nausea, vomiting, or diarrhea. 04-24-2022 upon evaluation today patient appears to be doing about the same in regard to his foot ulcer. Were not really seeing much in the way of improvements here. With that being said I do believe that he is tolerating the dressing changes without complication. No fevers, chills, nausea, vomiting, or diarrhea. With that being said the wound on his right plantar foot just does not seem to be making progress here. I do believe that he may benefit from vascular intervention which we previously discussed but he is previously wanted to hold off on. However at this point I think that this is becoming more crucial and he did ask me about getting in touch with the vascular doctor in order to see if they can get him scheduled sooner his next follow-up until like the end of December. I am more than happy to do this as I think it would be definitely beneficial for him. 05-01-2022 upon evaluation patient's wound bed actually showed signs of good granulation and epithelization at this point all things considered. Still his ABIs are very low is about 50% on each leg. With that being said he does have a follow-up with vascular in about 2 weeks maybe 2-1/2 weeks where he is going to have the angiogram for the right leg and then subsequently the left leg will be to follow. Nonetheless I think this is can make a big difference in getting the wounds healed to be honest. Fortunately I do not see any signs of active infection at this point. 11/9; patient arrives in clinic with 2 new superficial wounds on the right leg and right ankle. Both of these and the same condition of the original wound. In terms of the wound on his plantar foot about the same. We have been using collagen. His arterial  review/angiogram is due for November 21 05-15-2022 upon evaluation today patient appears to be doing well currently in regard to his wounds all things considered. He actually has an appointment upcoming with the vascular surgeon where he is going to be undergoing intervention. Obviously I think this could be beneficial as far as helping this right leg and foot to heal it also allows to be able to put compression on which I think is much needed. He voiced understanding. That is on 21 November.. 05-29-2022 upon evaluation today patient appears to be doing significantly better in regard to his leg in general he seems to have much better blood flow compared to previous. Fortunately there does not appear to be any signs of infection locally nor systemically at this time. The good news is post his intervention procedure with Dr. Delana Meyer he only has 10% residual blockage in the right leg and to be honest this already looks much better from a color perspective I am very pleased with where we stand. 06-05-2022 upon evaluation today patient appears to be doing well currently in regard to his wounds. He has been tolerating the dressing changes the legs all appear to be healed. In regard to the foot this is measuring much smaller and doing much better and overall I am extremely pleased with where we stand currently. I do not  see any signs of active infection locally or systemically at this time. Electronic Signature(s) Signed: 06/05/2022 10:01:56 AM By: Worthy Keeler PA-C Entered By: Worthy Keeler on 06/05/2022 10:01:56 -------------------------------------------------------------------------------- Physical Exam Details Patient Name: Date of Service: [name] [facility] D. 06/05/2022 9:15 A M Medical Record Number: [id number] Patient Account Number: [id number] Date of Birth/Sex: Treating RN: 11/08/1940 (81 y.o. [name] Primary Care Provider: [name] Other Clinician: Referring Provider: Treating  Provider/Extender: [name] in Treatment: 70 Constitutional Well-nourished and well-hydrated in no acute distress. Respiratory normal breathing without difficulty. Psychiatric this patient is able to make decisions and demonstrates good insight into disease process. Alert and Oriented x 3. pleasant and cooperative. Notes Upon inspection patient's wound bed actually showed signs of good granulation and epithelization at this point. Fortunately I see no evidence of active infection locally or systemically which is great news and overall I am extremely pleased with where we stand today. I did perform some debridement of clearway some of the callus around the edges of the wound but no debridement of the wound surface was needed this is doing much better compared to even last week's evaluation. EMMITT, MATTHEWS ([id number]) [id number] Page 4 of 9 Electronic Signature(s) Signed: 06/05/2022 10:02:50 AM By: Worthy Keeler PA-C Entered By: Worthy Keeler on 06/05/2022 10:02:50 -------------------------------------------------------------------------------- Physician Orders Details Patient Name: Date of Service: [name] [facility] D. 06/05/2022 9:15 A M Medical Record Number: [id number] Patient Account Number: [id number] Date of Birth/Sex: Treating RN: December 22, 1940 (81 y.o. [name] Primary Care Provider: [name] Other Clinician: Referring Provider: Treating Provider/Extender: [name] in Treatment: 66 Verbal / Phone Orders: No Diagnosis Coding ICD-10 Coding Code Description [phone] Chronic venous hypertension (idiopathic) with ulcer and inflammation of bilateral lower extremity L97.812 Non-pressure chronic ulcer of other part of right lower leg with fat layer exposed L97.822 Non-pressure chronic ulcer of other part of left lower leg with fat layer exposed G90.09 Other idiopathic peripheral autonomic  neuropathy L97.522 Non-pressure chronic ulcer of other part of left foot with fat layer exposed L97.512 Non-pressure chronic ulcer of other part of right foot with fat layer exposed I10 Essential (primary) hypertension I50.42 Chronic combined systolic (congestive) and diastolic (congestive) heart failure I48.0 Paroxysmal atrial fibrillation I42.0 Dilated cardiomyopathy Z79.01 Long term (current) use of anticoagulants I25.10 Atherosclerotic heart disease of native coronary artery without angina pectoris N18.32 Chronic kidney disease, stage 3b Follow-up Appointments Return Appointment in 1 week. Nurse Visit as needed Bathing/ Shower/ Hygiene May shower with wound dressing protected with water repellent cover or cast protector. Edema Control - Lymphedema / Segmental Compressive Device / Other Tubigrip double layer applied - tubi E double layer Elevate, Exercise Daily and A void Standing for Long Periods of Time. Elevate legs to the level of the heart and pump ankles as often as possible Elevate leg(s) parallel to the floor when sitting. Off-Loading Right Lower Extremity Open toe surgical shoe with peg assist. Wound Treatment Wound #2 - Foot Wound Laterality: Plantar, Right Cleanser: Wound Cleanser 3 x Per Week/30 Days Discharge Instructions: Wash your hands with soap and water. Remove old dressing, discard into plastic bag and place into trash. Cleanse the wound with Wound Cleanser prior to applying a clean dressing using gauze sponges, not tissues or cotton balls. Do not scrub or use excessive force. Pat dry using gauze sponges, not tissue or cotton balls. Prim Dressing: Prisma 4.34 (in) 3 x Per Week/30 Days ary  Discharge Instructions: Moisten w/normal saline or sterile water; Cover wound as directed. Do not remove from wound bed. Compression Wrap: 3-LAYER WRAP - Profore Lite LF 3 Multilayer Compression Bandaging System 3 x Per Week/30 Days ZAMARION, LONGEST D ([id number])  [id number] Page 5 of 9 Discharge Instructions: Apply 3 multi-layer wrap as prescribed. Electronic Signature(s) Signed: 06/05/2022 4:35:46 PM By: Worthy Keeler PA-C Signed: 06/06/2022 1:42:10 PM By: Rosalio Loud MSN RN CNS WTA Entered By: Rosalio Loud on 06/05/2022 10:01:44 -------------------------------------------------------------------------------- Problem List Details Patient Name: Date of Service: [name] [facility] D. 06/05/2022 9:15 A M Medical Record Number: [id number] Patient Account Number: [id number] Date of Birth/Sex: Treating RN: 14-Apr-1941 (81 y.o. [name] Primary Care Provider: [name] Other Clinician: Referring Provider: Treating Provider/Extender: [name] in Treatment: 20 Active Problems ICD-10 Encounter Code Description Active Date MDM Diagnosis I87.333 Chronic venous hypertension (idiopathic) with ulcer and inflammation of 01/16/2022 No Yes bilateral lower extremity L97.812 Non-pressure chronic ulcer of other part of right lower leg with fat layer 01/16/2022 No Yes exposed L97.822 Non-pressure chronic ulcer of other part of left lower leg with fat layer exposed 01/16/2022 No Yes G90.09 Other idiopathic peripheral autonomic neuropathy 01/16/2022 No Yes L97.522 Non-pressure chronic ulcer of other part of left foot with fat layer exposed 01/16/2022 No Yes L97.512 Non-pressure chronic ulcer of other part of right foot with fat layer exposed 01/16/2022 No Yes I10 Essential (primary) hypertension 01/16/2022 No Yes I50.42 Chronic combined systolic (congestive) and diastolic (congestive) heart failure 01/16/2022 No Yes I48.0 Paroxysmal atrial fibrillation 01/16/2022 No Yes I42.0 Dilated cardiomyopathy 01/16/2022 No Yes Mackie, Amy D ([id number]) [id number] Page 6 of 9 Z79.01 Long term (current) use of anticoagulants 01/16/2022 No Yes I25.10 Atherosclerotic heart disease of native  coronary artery without angina pectoris 01/16/2022 No Yes N18.32 Chronic kidney disease, stage 3b 01/16/2022 No Yes Inactive Problems Resolved Problems Electronic Signature(s) Signed: 06/05/2022 9:32:06 AM By: Worthy Keeler PA-C Entered By: Worthy Keeler on 06/05/2022 09:32:06 -------------------------------------------------------------------------------- Progress Note Details Patient Name: Date of Service: [name] [facility] D. 06/05/2022 9:15 A M Medical Record Number: [id number] Patient Account Number: [id number] Date of Birth/Sex: Treating RN: 11-21-1940 (81 y.o. [name] Primary Care Provider: [name] Other Clinician: Referring Provider: Treating Provider/Extender: [name] in Treatment: 20 Subjective Chief Complaint Information obtained from Patient Multiple LE Ulcers History of Present Illness (HPI) 01-16-2022 upon evaluation today patient presents for initial inspection here in the clinic concerning issues that he has been having with wounds over the bilateral lower extremities and bilateral feet. Fortunately there does not appear to be any signs of significant infection at this point which is great news. Unfortunately his ABIs were registering at 0.49 on the left and 0.57 on the right here in the clinic on the screening today. With that being said I do think that it may possibly be true that we need to get him to formal arterial studies in order to see how things really are showing up. And the patient's not in disagreement with this for that reason we are going to make that referral as well. With that being said he does have some significant past medical history items of note detailed below. Patient does have chronic kidney disease stage IIIb, coronary artery disease, long-term use of anticoagulant therapy, dilated cardiomyopathy. Proximal atrial fibrillation, he is on Xarelto long-term. He also has congestive heart failure, hypertension,  peripheral neuropathy not related to diabetes as he is only  prediabetic with a hemoglobin A1c of 5.8, and chronic venous hypertension. He has recently seen Dr. [name] show his cardiologist who did discontinue the hydrochlorothiazide and the Lasix and felt that the patient was retaining more fluid than he should be. For that reason he was started on furosemide 20 mg twice daily. 7/27; patient with bilateral lower extremity wounds. These are largely venous although he had very poor ABIs on screening test here. He has an appointment apparently tomorrow at vein and vascular for arterial studies. He was not too keen on the latter although I think I emphasized the need to do this both in terms of dealing with the current wounds and being prepared for further issues down the road. We have been using silver alginate on the wounds and Tubigrip's edema control is better per our intake nurse 02-06-2022 upon evaluation today patient appears to be doing well with regard to his wound. Fortunately there is no signs of infection he is actually healing for the most part on the bilateral feet. His legs are also doing much better though he has 1 new area that popped up on the right leg. Fortunately there is no evidence of active infection of note his ABIs and TBI's were poor on the vascular screening on the 31st with the left being worse than the right. For that reason I am going to recommend he still continue to follow-up with vascular to discuss angioplasty/stenting. 8-124-23 upon evaluation today patient appears to be doing well currently in regard to his wound on the leg although he has several other blisters that are KEYONTA, BARRADAS ([id number]) [id number] Page 7 of 9 showing up at this point. I do believe that we need to try to do something to get his swelling under control this could be of utmost importance as far as getting this healed is concerned. 03-06-2022 upon evaluation today  patient appears to be doing well currently in regard to his wounds. In fact the legs are drying up and seem to be healing quite nicely the lateral wounds are still weeping a little bit about the medial side actually appears to be pretty much dry and closed. The plantar aspect of his foot still is draining a bit but again it seems to be doing much better which is great news.[name] 03-13-2022 upon evaluation patient appears to be almost completely healed. His leg is doing excellent and his foot is just about closed. With that being said he did see vascular and they actually discussed the possibility of doing an arteriogram/angiogram in order to see if anything needed to be the balloons were stented open. With that being said he decided not to do that since things are healing so well right now and he is going to consider that for future they plan to see him back in November. 03-21-2022 upon evaluation today patient appears to be doing a little worse in regard to his wound on the foot. The wound keeps seeming to pull together unfortunately which is causing a trapped some fluid is just not healing quite as well as would like to see. With that being said I am going to try to remove a bit more of the overgrowth callus tissue today and hopefully that can help get this to seal up much more effectively and quickly. Fortunately I see no signs of infection of the foot although the leg on the right does seem to show some signs of erythema today I do think we need to address that currently. This  looks to be cellulitis. 03-31-2022 upon evaluation today patient appears to be doing better in regard to the cellulitis I am pleased in that regard he is still taking the antibiotics. Fortunately I do not see any evidence of active infection locally or systemically at this time which is great news. No fevers, chills, nausea, vomiting, or diarrhea. 04-10-2022 upon evaluation today patient appears to be doing well currently in regard  to his foot ulcer I think were very close to complete resolution which is great news. Fortunately I do not see any signs of active infection locally or systemically at this time. No fevers, chills, nausea, vomiting, or diarrhea. 04-24-2022 upon evaluation today patient appears to be doing about the same in regard to his foot ulcer. Were not really seeing much in the way of improvements here. With that being said I do believe that he is tolerating the dressing changes without complication. No fevers, chills, nausea, vomiting, or diarrhea. With that being said the wound on his right plantar foot just does not seem to be making progress here. I do believe that he may benefit from vascular intervention which we previously discussed but he is previously wanted to hold off on. However at this point I think that this is becoming more crucial and he did ask me about getting in touch with the vascular doctor in order to see if they can get him scheduled sooner his next follow-up until like the end of December. I am more than happy to do this as I think it would be definitely beneficial for him. 05-01-2022 upon evaluation patient's wound bed actually showed signs of good granulation and epithelization at this point all things considered. Still his ABIs are very low is about 50% on each leg. With that being said he does have a follow-up with vascular in about 2 weeks maybe 2-1/2 weeks where he is going to have the angiogram for the right leg and then subsequently the left leg will be to follow. Nonetheless I think this is can make a big difference in getting the wounds healed to be honest. Fortunately I do not see any signs of active infection at this point. 11/9; patient arrives in clinic with 2 new superficial wounds on the right leg and right ankle. Both of these and the same condition of the original wound. In terms of the wound on his plantar foot about the same. We have been using collagen. His arterial  review/angiogram is due for November 21 05-15-2022 upon evaluation today patient appears to be doing well currently in regard to his wounds all things considered. He actually has an appointment upcoming with the vascular surgeon where he is going to be undergoing intervention. Obviously I think this could be beneficial as far as helping this right leg and foot to heal it also allows to be able to put compression on which I think is much needed. He voiced understanding. That is on 21 November.. 05-29-2022 upon evaluation today patient appears to be doing significantly better in regard to his leg in general he seems to have much better blood flow compared to previous. Fortunately there does not appear to be any signs of infection locally nor systemically at this time. The good news is post his intervention procedure with Dr. Delana Meyer he only has 10% residual blockage in the right leg and to be honest this already looks much better from a color perspective I am very pleased with where we stand. 06-05-2022 upon evaluation today patient appears to  be doing well currently in regard to his wounds. He has been tolerating the dressing changes the legs all appear to be healed. In regard to the foot this is measuring much smaller and doing much better and overall I am extremely pleased with where we stand currently. I do not see any signs of active infection locally or systemically at this time. Objective Constitutional Well-nourished and well-hydrated in no acute distress. Vitals Time Taken: 9:35 AM, Height: 74 in, Weight: 244 lbs, BMI: 31.3, Temperature: 98.1 F, Pulse: 70 bpm, Respiratory Rate: 16 breaths/min, Blood Pressure: 89/55 mmHg. Respiratory normal breathing without difficulty. Psychiatric this patient is able to make decisions and demonstrates good insight into disease process. Alert and Oriented x 3. pleasant and cooperative. General Notes: Upon inspection patient's wound bed actually showed  signs of good granulation and epithelization at this point. Fortunately I see no evidence of active infection locally or systemically which is great news and overall I am extremely pleased with where we stand today. I did perform some debridement of clearway some of the callus around the edges of the wound but no debridement of the wound surface was needed this is doing much better compared to even last week's evaluation. Integumentary (Hair, Skin) Wound #10 status is Healed - Epithelialized. Original cause of wound was Gradually Appeared. The date acquired was: 05/03/2022. The wound has been in treatment 4 weeks. The wound is located on the Right,Distal,Lateral Lower Leg. The wound measures 0cm length x 0cm width x 0cm depth; 0cm^2 area and 0cm^3 volume. There is Fat Layer (Subcutaneous Tissue) exposed. There is a medium amount of serous drainage noted. There is medium (34-66%) red, friable granulation within the wound bed. CHEIKH, BRAMBLE ([id number]) [id number] Page 8 of 9 Wound #2 status is Open. Original cause of wound was Gradually Appeared. The date acquired was: 12/28/2020. The wound has been in treatment 20 weeks. The wound is located on the [place]. The wound measures 0.8cm length x 0.2cm width x 0.2cm depth; 0.126cm^2 area and 0.025cm^3 volume. There is Fat Layer (Subcutaneous Tissue) exposed. There is a medium amount of serosanguineous drainage noted. There is small (1-33%) red granulation within the wound bed. There is no necrotic tissue within the wound bed. Wound #9 status is Healed - Epithelialized. Original cause of wound was Skin T ear/Laceration. The date acquired was: 04/09/2022. The wound has been in treatment 8 weeks. The wound is located on the Right,Lateral,Superior Lower Leg. The wound measures 0cm length x 0cm width x 0cm depth; 0cm^2 area and 0cm^3 volume. There is Fat Layer (Subcutaneous Tissue) exposed. There is a medium amount of  serosanguineous drainage noted. The wound margin is flat and intact. There is no granulation within the wound bed. There is no necrotic tissue within the wound bed. Assessment Active Problems ICD-10 Chronic venous hypertension (idiopathic) with ulcer and inflammation of bilateral lower extremity Non-pressure chronic ulcer of other part of right lower leg with fat layer exposed Non-pressure chronic ulcer of other part of left lower leg with fat layer exposed Other idiopathic peripheral autonomic neuropathy Non-pressure chronic ulcer of other part of left foot with fat layer exposed Non-pressure chronic ulcer of other part of right foot with fat layer exposed Essential (primary) hypertension Chronic combined systolic (congestive) and diastolic (congestive) heart failure Paroxysmal atrial fibrillation Dilated cardiomyopathy Long term (current) use of anticoagulants Atherosclerotic heart disease 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 There was a Selective/Open Wound Non-Viable Tissue Debridement with a total area of 0.16 sq cm performed by Tommie Sams., PA-C. With the following instrument(s): Curette to remove Non-Viable tissue/material. Material removed includes Callus. No specimens were taken.A Minimum amount of bleeding was controlled with Pressure. The procedure was tolerated well. Post Debridement Measurements: 0.8cm length x 0.2cm width x 0.2cm depth; 0.025cm^3 volume. Character of Wound/Ulcer Post Debridement is stable. Post procedure Diagnosis Wound #2: Same as Pre-Procedure Plan Follow-up Appointments: Return Appointment in 1 week. Nurse Visit as needed Bathing/ Shower/ Hygiene: May shower with wound dressing protected with water repellent cover or cast protector. Edema Control - Lymphedema / Segmental  Compressive Device / Other: Tubigrip double layer applied - tubi E double layer Elevate, Exercise Daily and Avoid Standing for Long Periods of Time. Elevate legs to the level of the heart and pump ankles as often as possible Elevate leg(s) parallel to the floor when sitting. Off-Loading: Open toe surgical shoe with peg assist. WOUND #2: - Foot Wound Laterality: Plantar, Right Cleanser: Wound Cleanser 3 x Per Week/30 Days Discharge Instructions: Wash your hands with soap and water. Remove old dressing, discard into plastic bag and place into trash. Cleanse the wound with Wound Cleanser prior to applying a clean dressing using gauze sponges, not tissues or cotton balls. Do not scrub or use excessive force. Pat dry using gauze sponges, not tissue or cotton balls. Prim Dressing: Prisma 4.34 (in) 3 x Per Week/30 Days ary Discharge Instructions: Moisten w/normal saline or sterile water; Cover wound as directed. Do not remove from wound bed. Com pression Wrap: 3-LAYER WRAP - Profore Lite LF 3 Multilayer Compression Bandaging System 3 x Per Week/30 Days Discharge Instructions: Apply 3 multi-layer wrap as prescribed. 1. I am good recommend that we continue with the silver collagen for the plantar aspect of the foot. That seems to be doing well. 2. I would recommend that we continue with the 3 layer compression wrap for the leg which I think is doing a great job helping keeping the edema under control. The wounds have healed. 3. I am also can recommend that he should continue to elevate his legs much as possible he will be talking to vascular about the procedure on the left leg as well he does have symptoms of claudication with walking he starts to develop cramping in his leg I think that would help prevent some of that and help him to be able to get around a little bit better. Nonetheless he is good to have that conversation with vascular when he sees them. We will see patient back for reevaluation in  1 week here in the clinic. If anything worsens or changes patient will contact our office for additional recommendations. MASEN, LUALLEN ([id number]) [id number] Page 9 of 9 Electronic Signature(s) Signed: 06/05/2022 10:03:19 AM By: Worthy Keeler PA-C Entered By: Worthy Keeler on 06/05/2022 10:03:19 -------------------------------------------------------------------------------- SuperBill Details Patient Name: Date of Service: [name] [facility] D. 06/05/2022 Medical Record Number: [id number] Patient Account Number: [id number] Date of Birth/Sex: Treating RN: 1941-02-24 (81 y.o. [name] Primary Care Provider: [name] Other Clinician: Referring Provider: Treating Provider/Extender: [name] in Treatment: 20 Diagnosis Coding ICD-10 Codes Code Description [phone] Chronic venous hypertension (idiopathic) with ulcer and inflammation of bilateral lower extremity L97.812 Non-pressure chronic ulcer of other part of right lower leg with fat layer exposed L97.822 Non-pressure chronic ulcer of other  part of left lower leg with fat layer exposed G90.09 Other idiopathic peripheral autonomic neuropathy L97.522 Non-pressure chronic ulcer of other part of left foot with fat layer exposed L97.512 Non-pressure chronic ulcer of other part of right foot with fat layer exposed I10 Essential (primary) hypertension I50.42 Chronic combined systolic (congestive) and diastolic (congestive) heart failure I48.0 Paroxysmal atrial fibrillation I42.0 Dilated cardiomyopathy Z79.01 Long term (current) use of anticoagulants I25.10 Atherosclerotic heart disease of native coronary artery without angina pectoris N18.32 Chronic kidney disease, stage 3b Facility Procedures : CPT4 Code: [id number] Description: 12929 - DEBRIDE WOUND 1ST 20 SQ CM OR < ICD-10 Diagnosis Description L97.512 Non-pressure chronic ulcer of other part of right foot with fat layer  exposed Modifier: Quantity: 1 Physician Procedures : CPT4 Code Description Modifier [id number] - WC PHYS DEBR WO ANESTH 20 SQ CM ICD-10 Diagnosis Description L97.512 Non-pressure chronic ulcer of other part of right foot with fat layer exposed Quantity: 1 Electronic Signature(s) Signed: 06/05/2022 10:07:15 AM By: Worthy Keeler PA-C Entered By: Worthy Keeler on 06/05/2022 10:07:14

## 2022-06-05 NOTE — Progress Notes (Addendum)
SHELLY, SHOULTZ ([id number]) [id number] Page 1 of 11 Visit Report for 06/05/2022 Arrival Information Details Patient Name: Date of Service: [name] [facility] [name]. 06/05/2022 9:15 A M Medical Record Number: [id number] Patient Account Number: [id number] Date of Birth/Sex: Treating RN: September 21, 1940 (81 y.o. [name] Primary Care Trella Thurmond: [name] Other Clinician: Referring Lea Baine: Treating Sumayah Bearse/Extender: [name] in Treatment: 75 Visit Information History Since Last Visit Added or deleted any medications: No Patient Arrived: [name] Any new allergies or adverse reactions: No Arrival Time: 09:26 Had a fall or experienced change in No Accompanied By: self activities of daily living that may affect Transfer Assistance: None risk of falls: Patient Requires Transmission-Based Precautions: No Hospitalized since last visit: No Patient Has Alerts: Yes Pain Present Now: No Patient Alerts: ABI R 0.69 L 0.66 TBI R 0.41 L 0.16 Electronic Signature(s) Signed: 06/06/2022 1:42:10 PM By: Rosalio Loud MSN RN CNS WTA Entered By: Rosalio Loud on 06/05/2022 09:35:52 -------------------------------------------------------------------------------- Clinic Level of Care Assessment Details Patient Name: Date of Service: [name] [facility] [name]. 06/05/2022 9:15 A M Medical Record Number: [id number] Patient Account Number: [id number] Date of Birth/Sex: Treating RN: April 24, 1941 (81 y.o. [name] Primary Care Cameryn Schum: [name] Other Clinician: Referring Lillis Nuttle: Treating Jaelen Soth/Extender: [name] in Treatment: 20 Clinic Level of Care Assessment Items TOOL 1 Quantity Score []  - 0 Use when EandM and Procedure is performed on INITIAL visit ASSESSMENTS - Nursing Assessment / Reassessment []  - 0 General Physical Exam (combine w/ comprehensive assessment (listed just below) when performed on new pt. evals) []  -  0 Comprehensive Assessment (HX, ROS, Risk Assessments, Wounds Hx, etc.) ASSESSMENTS - Wound and Skin Assessment / Reassessment []  - 0 Dermatologic / Skin Assessment (not related to wound area) ASSESSMENTS - Ostomy and/or Continence Assessment and Care []  - 0 Incontinence Assessment and Management [name], [name] ([id number]) [id number] Page 2 of 11 []  - 0 Ostomy Care Assessment and Management (repouching, etc.) PROCESS - Coordination of Care []  - 0 Simple Patient / Family Education for ongoing care []  - 0 Complex (extensive) Patient / Family Education for ongoing care []  - 0 Staff obtains Programmer, systems, Records, T Results / Process Orders est []  - 0 Staff telephones HHA, Nursing Homes / Clarify orders / etc []  - 0 Routine Transfer to another Facility (non-emergent condition) []  - 0 Routine Hospital Admission (non-emergent condition) []  - 0 New Admissions / Biomedical engineer / Ordering NPWT Apligraf, etc. , []  - 0 Emergency Hospital Admission (emergent condition) PROCESS - Special Needs []  - 0 Pediatric / Minor Patient Management []  - 0 Isolation Patient Management []  - 0 Hearing / Language / Visual special needs []  - 0 Assessment of Community assistance (transportation, [name]/C planning, etc.) []  - 0 Additional assistance / Altered mentation []  - 0 Support Surface(s) Assessment (bed, cushion, seat, etc.) INTERVENTIONS - Miscellaneous []  - 0 External ear exam []  - 0 Patient Transfer (multiple staff / Civil Service fast streamer / Similar devices) []  - 0 Simple Staple / Suture removal (25 or less) []  - 0 Complex Staple / Suture removal (26 or more) []  - 0 Hypo/Hyperglycemic Management (do not check if billed separately) []  - 0 Ankle / Brachial Index (ABI) - do not check if billed separately Has the patient been seen at the hospital within the last three years: Yes Total Score: 0 Level Of Care: ____ Electronic Signature(s) Signed: 06/06/2022 1:42:10 PM  By: Rosalio Loud MSN RN CNS WTA Entered By: Tamala Julian,  Jocelyn Lamer on 06/05/2022 10:01:55 -------------------------------------------------------------------------------- Encounter Discharge Information Details Patient Name: Date of Service: [name] [facility] [name]. 06/05/2022 9:15 A M Medical Record Number: [id number] Patient Account Number: [id number] Date of Birth/Sex: Treating RN: 06/27/1941 (81 y.o. [name] Primary Care Ayeza Therriault: [name] Other Clinician: Referring Adeyemi Hamad: Treating Tara Rud/Extender: [name] in Treatment: 20 Encounter Discharge Information Items Post Procedure Vitals Discharge Condition: Stable Temperature (F): 98.1 Ambulatory Status: Walker Pulse (bpm): 70 [name], [name] ([id number]) [id number] Page 3 of 11 Discharge Destination: Home Respiratory Rate (breaths/min): 16 Transportation: Private Auto Blood Pressure (mmHg): 89/55 Accompanied By: self Schedule Follow-up Appointment: Yes Clinical Summary of Care: Electronic Signature(s) Signed: 06/06/2022 10:21:38 AM By: Rosalio Loud MSN RN CNS WTA Entered By: Rosalio Loud on 06/06/2022 10:21:38 -------------------------------------------------------------------------------- Lower Extremity Assessment Details Patient Name: Date of Service: [name] [facility] [name]. 06/05/2022 9:15 A M Medical Record Number: [id number] Patient Account Number: [id number] Date of Birth/Sex: Treating RN: 1941-05-07 (81 y.o. [name] Primary Care Teaghan Formica: [name] Other Clinician: Referring Margareth Kanner: Treating Imran Nuon/Extender: [name] Weeks in Treatment: 20 Edema Assessment Assessed: [name]: No] [name]: No] [Left: Edema] [Right: :] Calf Left: Right: Point of Measurement: From Medial Instep 41 cm Ankle Left: Right: Point of Measurement: From Medial Instep 28.5 cm Vascular Assessment Pulses: Dorsalis Pedis Palpable: [Right:Yes] Electronic  Signature(s) Signed: 06/06/2022 1:42:10 PM By: Rosalio Loud MSN RN CNS WTA Entered By: Rosalio Loud on 06/05/2022 10:01:16 -------------------------------------------------------------------------------- Multi Wound Chart Details Patient Name: Date of Service: [name] [facility] [name]. 06/05/2022 9:15 A Georgia Duff, Deidre Ala [name] ([id number]) [id number] Page 4 of 11 Medical Record Number: [id number] Patient Account Number: [id number] Date of Birth/Sex: Treating RN: 09-11-1940 (81 y.o. [name] Primary Care Justyna Timoney: [name] Other Clinician: Referring Quinlan Mcfall: Treating Hilarie Sinha/Extender: [name] in Treatment: 20 Vital Signs Height(in): 108 Pulse(bpm): 80 Weight(lbs): 244 Blood Pressure(mmHg): 89/55 Body Mass Index(BMI): 31.3 Temperature(F): 98.1 Respiratory Rate(breaths/min): 16 [10:Photos:] Right, Distal, Lateral Lower Leg Right, Plantar Foot Right, Lateral, Superior Lower Leg Wound Location: Gradually Appeared Gradually Appeared Skin T ear/Laceration Wounding Event: Abrasion Neuropathic Ulcer-Non Diabetic Skin T ear Primary Etiology: Congestive Heart Failure, Congestive Heart Failure, Congestive Heart Failure, Comorbid History: Hypertension Hypertension Hypertension 05/03/2022 12/28/2020 04/09/2022 Date A cquired: 4 20 8  Weeks of Treatment: Healed - Epithelialized Open Healed - Epithelialized Wound Status: No No No Wound Recurrence: 0x0x0 0.8x0.2x0.2 0x0x0 Measurements L x W x [name] (cm) 0 0.126 0 A (cm) : rea 0 0.025 0 Volume (cm) : 100.00% -59.50% 100.00% % Reduction in A rea: 100.00% -4.20% 100.00% % Reduction in Volume: Partial Thickness Full Thickness Without Exposed Partial Thickness Classification: Support Structures Medium Medium Medium Exudate A mount: Serous Serosanguineous Serosanguineous Exudate Type: amber red, brown red, brown Exudate Color: N/A N/A Flat and Intact Wound Margin: Medium (34-66%)  Small (1-33%) None Present (0%) Granulation A mount: Red, Friable Red N/A Granulation Quality: N/A None Present (0%) None Present (0%) Necrotic A mount: Fat Layer (Subcutaneous Tissue): Yes Fat Layer (Subcutaneous Tissue): Yes Fat Layer (Subcutaneous Tissue): Yes Exposed Structures: Fascia: No Fascia: No Fascia: No Tendon: No Tendon: No Tendon: No Muscle: No Muscle: No Muscle: No Joint: No Joint: No Joint: No Bone: No Bone: No Bone: No N/A Medium (34-66%) None Epithelialization: N/A Debridement - Selective/Open Wound N/A Debridement: N/A Callus N/A Tissue Debrided: N/A Non-Viable Tissue N/A Level: N/A 0.16 N/A Debridement A (sq cm): rea N/A Curette N/A Instrument: N/A Minimum N/A Bleeding: N/A Pressure  N/A Hemostasis A chieved: Debridement Treatment Response: N/A Procedure was tolerated well N/A Post Debridement Measurements L x N/A 0.8x0.2x0.2 N/A W x [name] (cm) N/A 0.025 N/A Post Debridement Volume: (cm) N/A Debridement N/A Procedures Performed: Treatment Notes Electronic Signature(s) Signed: 06/06/2022 1:42:10 PM By: Rosalio Loud MSN RN CNS WTA Entered By: Rosalio Loud on 06/05/2022 10:01:22 [name] ([id number]) [id number] Page 5 of 11 -------------------------------------------------------------------------------- Multi-Disciplinary Care Plan Details Patient Name: Date of Service: [name] [facility] [name]. 06/05/2022 9:15 A M Medical Record Number: [id number] Patient Account Number: [id number] Date of Birth/Sex: Treating RN: May 27, 1941 (81 y.o. [name] Primary Care Lashaun Krapf: [name] Other Clinician: Referring Darshawn Boateng: Treating Arlynn Mcdermid/Extender: [name] in Treatment: 20 Active Inactive Necrotic Tissue Nursing Diagnoses: Impaired tissue integrity related to necrotic/devitalized tissue Knowledge deficit related to management of necrotic/devitalized tissue Goals: Necrotic/devitalized  tissue will be minimized in the wound bed Date Initiated: 03/31/2022 Target Resolution Date: 03/31/2022 Goal Status: Active Patient/caregiver will verbalize understanding of reason and process for debridement of necrotic tissue Date Initiated: 03/31/2022 Target Resolution Date: 03/31/2022 Goal Status: Active Interventions: Assess patient pain level pre-, during and post procedure and prior to discharge Provide education on necrotic tissue and debridement process Treatment Activities: Excisional debridement : 03/31/2022 Notes: Wound/Skin Impairment Nursing Diagnoses: Knowledge deficit related to ulceration/compromised skin integrity Goals: Patient/caregiver will verbalize understanding of skin care regimen Date Initiated: 01/16/2022 Target Resolution Date: 02/16/2022 Goal Status: Active Ulcer/skin breakdown will have a volume reduction of 30% by week 4 Date Initiated: 01/16/2022 Target Resolution Date: 02/16/2022 Goal Status: Active Ulcer/skin breakdown will have a volume reduction of 50% by week 8 Date Initiated: 01/16/2022 Target Resolution Date: 03/19/2022 Goal Status: Active Ulcer/skin breakdown will have a volume reduction of 80% by week 12 Date Initiated: 01/16/2022 Target Resolution Date: 04/18/2022 Goal Status: Active Ulcer/skin breakdown will heal within 14 weeks Date Initiated: 01/16/2022 Target Resolution Date: 05/19/2022 Goal Status: Active Interventions: Assess patient/caregiver ability to obtain necessary supplies Assess patient/caregiver ability to perform ulcer/skin care regimen upon admission and as needed Assess ulceration(s) every visit Notes: [name], [name] ([id number]) [id number] Page 6 of 11 Electronic Signature(s) Signed: 06/06/2022 1:42:10 PM By: Rosalio Loud MSN RN CNS WTA Entered By: Rosalio Loud on 06/05/2022 10:02:26 -------------------------------------------------------------------------------- Pain Assessment  Details Patient Name: Date of Service: [name] [facility] [name]. 06/05/2022 9:15 A M Medical Record Number: [id number] Patient Account Number: [id number] Date of Birth/Sex: Treating RN: 1941/03/08 (81 y.o. [name] Primary Care Bettyjean Stefanski: [name] Other Clinician: Referring Vegas Coffin: Treating Xzavian Semmel/Extender: [name] in Treatment: 20 Active Problems Location of Pain Severity and Description of Pain Patient Has Paino No Site Locations Pain Management and Medication Current Pain Management: Electronic Signature(s) Signed: 06/06/2022 1:42:10 PM By: Rosalio Loud MSN RN CNS WTA Entered By: Rosalio Loud on 06/05/2022 09:36:26 -------------------------------------------------------------------------------- Patient/Caregiver Education Details Patient Name: Date of Service: [name] [facility] [name]. 12/7/2023andnbsp9:15 A Lynetta Mare [name] ([id number]) [id number] Page 7 of 11 Medical Record Number: [id number] Patient Account Number: [id number] Date of Birth/Gender: Treating RN: 1941-06-23 (81 y.o. [name] Primary Care Physician: [name] Other Clinician: Referring Physician: Treating Physician/Extender: [name] in Treatment: 20 Education Assessment Education Provided To: Patient Education Topics Provided Wound/Skin Impairment: Handouts: Caring for Your Ulcer Methods: Explain/Verbal Responses: State content correctly Electronic Signature(s) Signed: 06/06/2022 1:42:10 PM By: Rosalio Loud MSN RN CNS WTA Entered By: Rosalio Loud on 06/05/2022 10:02:20 -------------------------------------------------------------------------------- Wound Assessment Details Patient Name: Date  of Service: [name] [facility] [name]. 06/05/2022 9:15 A M Medical Record Number: [id number] Patient Account Number: [id number] Date of Birth/Sex: Treating RN: 02-11-1941 (81 y.o. [name] Primary Care Raylin Winer: [name] Other  Clinician: Referring Heike Pounds: Treating Valery Chance/Extender: [name] in Treatment: 20 Wound Status Wound Number: 10 Primary Etiology: Abrasion Wound Location: Right, Distal, Lateral Lower Leg Wound Status: Healed - Epithelialized Wounding Event: Gradually Appeared Comorbid History: Congestive Heart Failure, Hypertension Date Acquired: 05/03/2022 Weeks Of Treatment: 4 Clustered Wound: No Photos Wound Measurements Length: (cm) 0 Width: (cm) 0 Depth: (cm) 0 Area: (cm) 0 Volume: (cm) 0 [name], [name] ([id number]) % Reduction in Area: 100% % Reduction in Volume: 100% [id number] Page 8 of 11 Wound Description Classification: Partial Thickness Exudate Amount: Medium Exudate Type: Serous Exudate Color: amber Foul Odor After Cleansing: No Slough/Fibrino No Wound Bed Granulation Amount: Medium (34-66%) Exposed Structure Granulation Quality: Red, Friable Fascia Exposed: No Fat Layer (Subcutaneous Tissue) Exposed: Yes Tendon Exposed: No Muscle Exposed: No Joint Exposed: No Bone Exposed: No Treatment Notes Wound #10 (Lower Leg) Wound Laterality: Right, Lateral, Distal Cleanser Peri-Wound Care Topical Primary Dressing Secondary Dressing Secured With Compression Wrap Compression Stockings Add-Ons Electronic Signature(s) Signed: 06/06/2022 1:42:10 PM By: Rosalio Loud MSN RN CNS WTA Entered By: Rosalio Loud on 06/05/2022 10:00:58 -------------------------------------------------------------------------------- Wound Assessment Details Patient Name: Date of Service: [name] [facility] [name]. 06/05/2022 9:15 A M Medical Record Number: [id number] Patient Account Number: [id number] Date of Birth/Sex: Treating RN: 1941-06-01 (81 y.o. [name] Primary Care Zoha Spranger: [name] Other Clinician: Referring Lelia Jons: Treating Mikhayla Phillis/Extender: [name] Weeks in Treatment: 20 Wound Status Wound Number:  2 Primary Etiology: Neuropathic Ulcer-Non Diabetic Wound Location: Right, Plantar Foot Wound Status: Open Wounding Event: Gradually Appeared Comorbid History: Congestive Heart Failure, Hypertension Date Acquired: 12/28/2020 Weeks Of Treatment: 20 Clustered Wound: No Photos [name], [name] ([id number]) [id number] Page 9 of 11 Wound Measurements Length: (cm) 0.8 Width: (cm) 0.2 Depth: (cm) 0.2 Area: (cm) 0.126 Volume: (cm) 0.025 % Reduction in Area: -59.5% % Reduction in Volume: -4.2% Epithelialization: Medium (34-66%) Wound Description Classification: Full Thickness Without Exposed Support Structures Exudate Amount: Medium Exudate Type: Serosanguineous Exudate Color: red, brown Foul Odor After Cleansing: No Slough/Fibrino No Wound Bed Granulation Amount: Small (1-33%) Exposed Structure Granulation Quality: Red Fascia Exposed: No Necrotic Amount: None Present (0%) Fat Layer (Subcutaneous Tissue) Exposed: Yes Tendon Exposed: No Muscle Exposed: No Joint Exposed: No Bone Exposed: No Treatment Notes Wound #2 (Foot) Wound Laterality: Plantar, Right Cleanser Wound Cleanser Discharge Instruction: Wash your hands with soap and water. Remove old dressing, discard into plastic bag and place into trash. Cleanse the wound with Wound Cleanser prior to applying a clean dressing using gauze sponges, not tissues or cotton balls. Do not scrub or use excessive force. Pat dry using gauze sponges, not tissue or cotton balls. Peri-Wound Care Topical Primary Dressing Prisma 4.34 (in) Discharge Instruction: Moisten w/normal saline or sterile water; Cover wound as directed. Do not remove from wound bed. Secondary Dressing Secured With Compression Wrap 3-LAYER WRAP - Profore Lite LF 3 Multilayer Compression Bandaging System Discharge Instruction: Apply 3 multi-layer wrap as prescribed. Compression Stockings Add-Ons Electronic Signature(s) Signed: 06/06/2022  1:42:10 PM By: Rosalio Loud MSN RN CNS WTA Entered By: Rosalio Loud on 06/05/2022 09:49:49 [name] ([id number]) [id number] Page 10 of 11 -------------------------------------------------------------------------------- Wound Assessment Details Patient Name: Date of Service: [name] [facility] [name]. 06/05/2022 9:15 A M Medical Record Number: [id number]  Patient Account Number: [id number] Date of Birth/Sex: Treating RN: 1940/08/08 (81 y.o. [name] Primary Care Londynn Sonoda: [name] Other Clinician: Referring Myiesha Edgar: Treating Ahren Pettinger/Extender: [name] in Treatment: 20 Wound Status Wound Number: 9 Primary Etiology: Skin Tear Wound Location: Right, Lateral, Superior Lower Leg Wound Status: Healed - Epithelialized Wounding Event: Skin Tear/Laceration Comorbid History: Congestive Heart Failure, Hypertension Date Acquired: 04/09/2022 Weeks Of Treatment: 8 Clustered Wound: No Photos Wound Measurements Length: (cm) Width: (cm) Depth: (cm) Area: (cm) Volume: (cm) 0 % Reduction in Area: 100% 0 % Reduction in Volume: 100% 0 Epithelialization: None 0 0 Wound Description Classification: Partial Thickness Wound Margin: Flat and Intact Exudate Amount: Medium Exudate Type: Serosanguineous Exudate Color: red, brown Foul Odor After Cleansing: No Slough/Fibrino No Wound Bed Granulation Amount: None Present (0%) Exposed Structure Necrotic Amount: None Present (0%) Fascia Exposed: No Fat Layer (Subcutaneous Tissue) Exposed: Yes Tendon Exposed: No Muscle Exposed: No Joint Exposed: No Bone Exposed: No Treatment Notes Wound #9 (Lower Leg) Wound Laterality: Right, Lateral, [name], [name] ([id number]) [id number] Page 11 of 11 Topical Primary Dressing Secondary Dressing Secured With Compression Wrap Compression Stockings Add-Ons Electronic  Signature(s) Signed: 06/06/2022 1:42:10 PM By: Rosalio Loud MSN RN CNS WTA Entered By: Rosalio Loud on 06/05/2022 10:00:58 -------------------------------------------------------------------------------- Vitals Details Patient Name: Date of Service: [name], [name]. 06/05/2022 9:15 A M Medical Record Number: [id number] Patient Account Number: [id number] Date of Birth/Sex: Treating RN: [birth year] (81 y.o. [name] Primary Care Tarius Stangelo: [name] Other Clinician: Referring Nicholous Girgenti: Treating Alinah Sheard/Extender: [name] in Treatment: 20 Vital Signs Time Taken: 09:35 Temperature (F): 98.1 Height (in): 74 Pulse (bpm): 70 Weight (lbs): 244 Respiratory Rate (breaths/min): 16 Body Mass Index (BMI): 31.3 Blood Pressure (mmHg): 89/55 Reference Range: 80 - 120 mg / dl Electronic Signature(s) Signed: 06/06/2022 1:42:10 PM By: Rosalio Loud MSN RN CNS WTA Entered By: Rosalio Loud on 06/05/2022 09:36:19

## 2022-06-10 ENCOUNTER — Other Ambulatory Visit (INDEPENDENT_AMBULATORY_CARE_PROVIDER_SITE_OTHER): Payer: Self-pay | Admitting: Vascular Surgery

## 2022-06-10 DIAGNOSIS — I739 Peripheral vascular disease, unspecified: Secondary | ICD-10-CM

## 2022-06-10 DIAGNOSIS — Z95820 Peripheral vascular angioplasty status with implants and grafts: Secondary | ICD-10-CM

## 2022-06-11 ENCOUNTER — Ambulatory Visit (INDEPENDENT_AMBULATORY_CARE_PROVIDER_SITE_OTHER): Payer: PPO

## 2022-06-11 ENCOUNTER — Encounter (INDEPENDENT_AMBULATORY_CARE_PROVIDER_SITE_OTHER): Payer: Self-pay | Admitting: Nurse Practitioner

## 2022-06-11 ENCOUNTER — Ambulatory Visit (INDEPENDENT_AMBULATORY_CARE_PROVIDER_SITE_OTHER): Payer: PPO | Admitting: Nurse Practitioner

## 2022-06-11 VITALS — BP 115/50 | HR 58 | Resp 14 | Ht 74.0 in | Wt 240.0 lb

## 2022-06-11 DIAGNOSIS — I70222 Atherosclerosis of native arteries of extremities with rest pain, left leg: Secondary | ICD-10-CM

## 2022-06-11 DIAGNOSIS — I10 Essential (primary) hypertension: Secondary | ICD-10-CM | POA: Diagnosis not present

## 2022-06-11 DIAGNOSIS — M5416 Radiculopathy, lumbar region: Secondary | ICD-10-CM

## 2022-06-11 NOTE — Progress Notes (Signed)
Subjective:    Patient ID: [name] Friends Sr., male    DOB: 1941-04-30, 81 y.o.   MRN: [id number] Chief Complaint  Patient presents with   Follow-up    ultrasound    The patient returns to the office for followup and review status post angiogram with intervention on 05/20/2022.   Procedure: Procedure(s) Performed:             1.  Introduction catheter into right lower extremity 3rd order catheter placement              2.    Contrast injection right lower extremity for distal runoff             3.  Percutaneous transluminal angioplasty and stent placement right SFA and popliteal             4.  Star close closure left common femoral arteriotomy   The patient notes improvement in the lower extremity symptoms of the right lower extremity.  The wounds on his leg have healed but the wound on his foot is still continuing to heal.  Per wound care they are pleased with his current progress.  He does note some worsening pain in his left lower extremity as well as some swelling in his left foot.  There have been no significant changes to the patient's overall health care.  No documented history of amaurosis fugax or recent TIA symptoms. There are no recent neurological changes noted. No documented history of DVT, PE or superficial thrombophlebitis. The patient denies recent episodes of angina or shortness of breath.   ABI's Rt=0.86 and Lt=0.36  (previous ABI's Rt=0.76 and Lt=0.79) Duplex US of the right lower extremity reveals biphasic tibial artery waveforms with normal toe waveforms.  The left lower extremity has monophasic tibial artery waveforms with diminished toe waveforms.    Review of Systems  Cardiovascular:  Positive for leg swelling.  Skin:  Positive for wound.  All other systems reviewed and are negative.      Objective:   Physical Exam Vitals reviewed.  HENT:     Head: Normocephalic.  Cardiovascular:     Rate and Rhythm: Normal rate.     Pulses:           Dorsalis pedis pulses are detected w/ Doppler on the right side and detected w/ Doppler on the left side.       Posterior tibial pulses are detected w/ Doppler on the right side and detected w/ Doppler on the left side.  Pulmonary:     Effort: Pulmonary effort is normal.  Skin:    General: Skin is warm and dry.  Neurological:     Mental Status: He is alert and oriented to person, place, and time.     Motor: Weakness present.     Gait: Gait abnormal.  Psychiatric:        Mood and Affect: Mood normal.        Behavior: Behavior normal.        Thought Content: Thought content normal.        Judgment: Judgment normal.     BP (!) 115/50 (BP Location: Right Arm)   Pulse (!) 58   Resp 14   Ht 6\' 2"  (1.88 m)   Wt 240 lb (108.9 kg)   BMI 30.81 kg/m   Past Medical History:  Diagnosis Date   Allergy    History of chicken pox    Hyperlipidemia    Hypertension  Social History   Socioeconomic History   Marital status: Widowed    Spouse name: Not on file   Number of children: 2   Years of education: Not on file   Highest education level: 12th grade  Occupational History   Occupation: Retired/ Disabled  Tobacco Use   Smoking status: Former    Packs/day: 2.00    Years: 50.00    Total pack years: 100.00    Types: Cigarettes    Quit date: 07/01/2007    Years since quitting: 14.9   Smokeless tobacco: Never  Vaping Use   Vaping Use: Never used  Substance and Sexual Activity   Alcohol use: No    Alcohol/week: 0.0 standard drinks of alcohol   Drug use: No   Sexual activity: Not on file  Other Topics Concern   Not on file  Social History Narrative   Not on file   Social Determinants of Health   Financial Resource Strain: Low Risk  (09/02/2021)   Overall Financial Resource Strain (CARDIA)    Difficulty of Paying Living Expenses: Not hard at all  Food Insecurity: No Food Insecurity (09/02/2021)   Hunger Vital Sign    Worried About Running Out of Food in the Last Year: Never  true    [place] in the Last Year: Never true  Transportation Needs: No Transportation Needs (09/02/2021)   PRAPARE - Hydrologist (Medical): No    Lack of Transportation (Non-Medical): No  Physical Activity: Inactive (09/02/2021)   Exercise Vital Sign    Days of Exercise per Week: 0 days    Minutes of Exercise per Session: 0 min  Stress: No Stress Concern Present (09/02/2021)   [place]    Feeling of Stress : Not at all  Social Connections: Socially Isolated (09/02/2021)   Social Connection and Isolation Panel [NHANES]    Frequency of Communication with Friends and Family: More than three times a week    Frequency of Social Gatherings with Friends and Family: Once a week    Attends Religious Services: Never    Marine scientist or Organizations: No    Attends Archivist Meetings: Never    Marital Status: Widowed  Intimate Partner Violence: Not At Risk (09/02/2021)   Humiliation, Afraid, Rape, and Kick questionnaire    Fear of Current or Ex-Partner: No    Emotionally Abused: No    Physically Abused: No    Sexually Abused: No    Past Surgical History:  Procedure Laterality Date   BACK SURGERY     x5   DOPPLER ECHOCARDIOGRAPHY  06/21/2013   EF=35% while in Sinus Rhythm   LOWER EXTREMITY ANGIOGRAPHY Right 05/20/2022   Procedure: Lower Extremity Angiography;  Surgeon: Katha Cabal, MD;  Location: [facility];  Service: Cardiovascular;  Laterality: Right;   Myocardial perfusion scan  10/2012   severe global LV enlargement. Mild RV enlargement. Mild LVH. Mild mitral and tricuspid insufficency   polyp excision  09/2005   UPPER GASTROINTESTINAL ENDOSCOPY  03/27/2004   Gastropathy, Gastritis; Duodenopathy    Family History  Problem Relation Age of Onset   Alzheimer's disease Mother    Heart attack Father    Colon cancer Brother     No Known  Allergies     Latest Ref Rng & Units 01/07/2022    9:25 AM 11/26/2021   10:38 AM 10/28/2021    5:12 PM  CBC  WBC 3.4 - 10.8 x10E3/uL 7.2  8.8  10.0   Hemoglobin 13.0 - 17.7 g/dL 11.3  12.4  13.6   Hematocrit 37.5 - 51.0 % 34.2  37.8  41.8   Platelets 150 - 450 x10E3/uL 226  275  250       CMP     Component Value Date/Time   NA 140 01/07/2022 0925   K 4.7 01/07/2022 0925   CL 100 01/07/2022 0925   CO2 23 01/07/2022 0925   GLUCOSE 87 01/07/2022 0925   GLUCOSE 108 (H) 11/26/2021 1038   BUN 26 (H) 05/20/2022 1230   BUN 32 (H) 01/07/2022 0925   CREATININE 1.64 (H) 05/20/2022 1230   CREATININE 1.71 (H) 03/19/2017 0813   CALCIUM 9.6 01/07/2022 0925   PROT 6.3 01/07/2022 0925   ALBUMIN 3.8 01/07/2022 0925   AST 18 01/07/2022 0925   ALT 15 01/07/2022 0925   ALKPHOS 109 01/07/2022 0925   BILITOT 0.8 01/07/2022 0925   GFRNONAA 42 (L) 05/20/2022 1230   GFRNONAA 39 (L) 03/19/2017 0813   GFRAA 39 (L) 04/04/2020 0808   GFRAA 45 (L) 03/19/2017 0813     No results found.     Assessment & Plan:   1. Atherosclerosis of native artery of left lower extremity with rest pain (HCC) Currently the patient's right lower extremity shows an improvement in ABIs but there is a significant deterioration in the left lower extremity symptoms.  The patient will continue to follow with wound care for the right.  Will continue to follow the right as well but the left lower extremity will need intervention.  Recommend:  The patient has evidence of severe atherosclerotic changes of both lower extremities with rest pain that is associated with preulcerative changes and impending tissue loss of the left foot.  This represents a limb threatening ischemia and places the patient at the risk for left limb loss.  Patient should undergo angiography of the left lower extremity with the hope for intervention for limb salvage.  The risks and benefits as well as the alternative therapies was discussed in detail with  the patient.  All questions were answered.  Patient agrees to proceed with left lower extremity angiography.  The patient will follow up with me in the office after the procedure.     - VAS [place] ABI WITH/WO TBI  2. Primary hypertension Continue antihypertensive medications as already ordered, these medications have been reviewed and there are no changes at this time.  3. Lumbar radiculopathy, chronic The patient does have some ongoing left leg pain.  He has known lumbar radiculopathy but given the patient's diminished arterial flow we cannot completely rule out this as a possible cause of his pain and symptoms.   Current Outpatient Medications on File Prior to Visit  Medication Sig Dispense Refill   amiodarone (PACERONE) 200 MG tablet Take 1 tablet (200 mg total) by mouth daily. 90 tablet 3   aspirin EC 81 MG tablet Take 1 tablet (81 mg total) by mouth daily. Swallow whole. 150 tablet 1   fluticasone (FLONASE) 50 MCG/ACT nasal spray Place 2 sprays into both nostrils daily. 16 g 6   hydrochlorothiazide (MICROZIDE) 12.5 MG capsule Take 12.5 mg by mouth daily.     levothyroxine (SYNTHROID) 100 MCG tablet TAKE 1 TABLET(100 MCG) BY MOUTH DAILY BEFORE BREAKFAST 90 tablet 3   lisinopril (ZESTRIL) 2.5 MG tablet Take 1 tablet (2.5 mg total) by mouth daily. 90 tablet 0   lovastatin (MEVACOR)  20 MG tablet Take 1 tablet (20 mg total) by mouth daily. 90 tablet 3   metoprolol succinate (TOPROL-XL) 25 MG 24 hr tablet 1 TABLET BY MOUTH ONCE A DAY 90 tablet 3   torsemide (DEMADEX) 20 MG tablet Take by mouth in the morning and at bedtime.     traMADol (ULTRAM) 50 MG tablet Take 50 mg by mouth 2 (two) times daily as needed.     XARELTO 20 MG TABS tablet Take 1 tablet (20 mg total) by mouth daily. 90 tablet 3   gabapentin (NEURONTIN) 300 MG capsule Take 1 capsule (300 mg total) by mouth 2 (two) times daily. 60 capsule 5   No current facility-administered medications on file prior to visit.    There are no  Patient Instructions on file for this visit. No follow-ups on file.   Kris Hartmann, NP

## 2022-06-12 ENCOUNTER — Encounter: Payer: PPO | Admitting: Physician Assistant

## 2022-06-12 DIAGNOSIS — I87331 Chronic venous hypertension (idiopathic) with ulcer and inflammation of right lower extremity: Secondary | ICD-10-CM | POA: Diagnosis not present

## 2022-06-12 DIAGNOSIS — I87333 Chronic venous hypertension (idiopathic) with ulcer and inflammation of bilateral lower extremity: Secondary | ICD-10-CM | POA: Diagnosis not present

## 2022-06-12 DIAGNOSIS — L97512 Non-pressure chronic ulcer of other part of right foot with fat layer exposed: Secondary | ICD-10-CM | POA: Diagnosis not present

## 2022-06-12 NOTE — Progress Notes (Signed)
[name], [name] ([id number]) [id number] Page 1 of 8 Visit Report for 06/12/2022 Chief Complaint Document Details Patient Name: Date of Service: [name] [facility] [name]. 06/12/2022 7:45 A M Medical Record Number: [id number] Patient Account Number: [id number] Date of Birth/Sex: Treating RN: [birth year] (81 y.o. [name] Primary Care Provider: [name] Other Clinician: Referring Provider: Treating Provider/Extender: [name] in Treatment: 21 Information Obtained from: Patient Chief Complaint Multiple LE Ulcers Electronic Signature(s) Signed: 06/12/2022 8:10:49 AM By: Worthy Keeler PA-C Entered By: Worthy Keeler on 06/12/2022 08:10:49 -------------------------------------------------------------------------------- HPI Details Patient Name: Date of Service: [name] [facility] [name]. 06/12/2022 7:45 A M Medical Record Number: [id number] Patient Account Number: [id number] Date of Birth/Sex: Treating RN: [birth year] (81 y.o. [name] Primary Care Provider: [name] Other Clinician: Referring Provider: Treating Provider/Extender: [name] in Treatment: 21 History of Present Illness HPI Description: 01-16-2022 upon evaluation today patient presents for initial inspection here in the clinic concerning issues that he has been having with wounds over the bilateral lower extremities and bilateral feet. Fortunately there does not appear to be any signs of significant infection at this point which is great news. Unfortunately his ABIs were registering at 0.49 on the left and 0.57 on the right here in the clinic on the screening today. With that being said I do think that it may possibly be true that we need to get him to formal arterial studies in order to see how things really are showing up. And the patient's not in disagreement with this for that reason we are going to make that referral as well. With that  being said he does have some significant past medical history items of note detailed below. Patient does have chronic kidney disease stage IIIb, coronary artery disease, long-term use of anticoagulant therapy, dilated cardiomyopathy. Proximal atrial fibrillation, he is on Xarelto long-term. He also has congestive heart failure, hypertension, peripheral neuropathy not related to diabetes as he is only prediabetic with a hemoglobin A1c of 5.8, and chronic venous hypertension. He has recently seen Dr. [name] show his cardiologist who did discontinue the hydrochlorothiazide and the Lasix and felt that the patient was retaining more fluid than he should be. For that reason he was started on furosemide 20 mg twice daily. 7/27; patient with bilateral lower extremity wounds. These are largely venous although he had very poor ABIs on screening test here. He has an appointment apparently tomorrow at vein and vascular for arterial studies. He was not too keen on the latter although I think I emphasized the need to do this both in terms of dealing with the current wounds and being prepared for further issues down the road. We have been using silver alginate on the wounds and Tubigrip's edema DOCTOR, SHEAHAN [name] ([id number]) [id number] Page 2 of 8 control is better per our intake nurse 02-06-2022 upon evaluation today patient appears to be doing well with regard to his wound. Fortunately there is no signs of infection he is actually healing for the most part on the bilateral feet. His legs are also doing much better though he has 1 new area that popped up on the right leg. Fortunately there is no evidence of active infection of note his ABIs and TBI's were poor on the vascular screening on the 31st with the left being worse than the right. For that reason I am going to recommend he still continue to follow-up with vascular to discuss angioplasty/stenting. [id number] upon  evaluation today  patient appears to be doing well currently in regard to his wound on the leg although he has several other blisters that are showing up at this point. I do believe that we need to try to do something to get his swelling under control this could be of utmost importance as far as getting this healed is concerned. 03-06-2022 upon evaluation today patient appears to be doing well currently in regard to his wounds. In fact the legs are drying up and seem to be healing quite nicely the lateral wounds are still weeping a little bit about the medial side actually appears to be pretty much dry and closed. The plantar aspect of his foot still is draining a bit but again it seems to be doing much better which is great news.[name] 03-13-2022 upon evaluation patient appears to be almost completely healed. His leg is doing excellent and his foot is just about closed. With that being said he did see vascular and they actually discussed the possibility of doing an arteriogram/angiogram in order to see if anything needed to be the balloons were stented open. With that being said he decided not to do that since things are healing so well right now and he is going to consider that for future they plan to see him back in November. 03-21-2022 upon evaluation today patient appears to be doing a little worse in regard to his wound on the foot. The wound keeps seeming to pull together unfortunately which is causing a trapped some fluid is just not healing quite as well as would like to see. With that being said I am going to try to remove a bit more of the overgrowth callus tissue today and hopefully that can help get this to seal up much more effectively and quickly. Fortunately I see no signs of infection of the foot although the leg on the right does seem to show some signs of erythema today I do think we need to address that currently. This looks to be cellulitis. 03-31-2022 upon evaluation today patient appears to be doing better  in regard to the cellulitis I am pleased in that regard he is still taking the antibiotics. Fortunately I do not see any evidence of active infection locally or systemically at this time which is great news. No fevers, chills, nausea, vomiting, or diarrhea. 04-10-2022 upon evaluation today patient appears to be doing well currently in regard to his foot ulcer I think were very close to complete resolution which is great news. Fortunately I do not see any signs of active infection locally or systemically at this time. No fevers, chills, nausea, vomiting, or diarrhea. 04-24-2022 upon evaluation today patient appears to be doing about the same in regard to his foot ulcer. Were not really seeing much in the way of improvements here. With that being said I do believe that he is tolerating the dressing changes without complication. No fevers, chills, nausea, vomiting, or diarrhea. With that being said the wound on his right plantar foot just does not seem to be making progress here. I do believe that he may benefit from vascular intervention which we previously discussed but he is previously wanted to hold off on. However at this point I think that this is becoming more crucial and he did ask me about getting in touch with the vascular doctor in order to see if they can get him scheduled sooner his next follow-up until like the end of December. I am more than happy  to do this as I think it would be definitely beneficial for him. 05-01-2022 upon evaluation patient's wound bed actually showed signs of good granulation and epithelization at this point all things considered. Still his ABIs are very low is about 50% on each leg. With that being said he does have a follow-up with vascular in about 2 weeks maybe 2-1/2 weeks where he is going to have the angiogram for the right leg and then subsequently the left leg will be to follow. Nonetheless I think this is can make a big difference in getting the wounds healed  to be honest. Fortunately I do not see any signs of active infection at this point. 11/9; patient arrives in clinic with 2 new superficial wounds on the right leg and right ankle. Both of these and the same condition of the original wound. In terms of the wound on his plantar foot about the same. We have been using collagen. His arterial review/angiogram is due for November 21 05-15-2022 upon evaluation today patient appears to be doing well currently in regard to his wounds all things considered. He actually has an appointment upcoming with the vascular surgeon where he is going to be undergoing intervention. Obviously I think this could be beneficial as far as helping this right leg and foot to heal it also allows to be able to put compression on which I think is much needed. He voiced understanding. That is on 21 November.. 05-29-2022 upon evaluation today patient appears to be doing significantly better in regard to his leg in general he seems to have much better blood flow compared to previous. Fortunately there does not appear to be any signs of infection locally nor systemically at this time. The good news is post his intervention procedure with Dr. Delana Meyer he only has 10% residual blockage in the right leg and to be honest this already looks much better from a color perspective I am very pleased with where we stand. 06-05-2022 upon evaluation today patient appears to be doing well currently in regard to his wounds. He has been tolerating the dressing changes the legs all appear to be healed. In regard to the foot this is measuring much smaller and doing much better and overall I am extremely pleased with where we stand currently. I do not see any signs of active infection locally or systemically at this time. 06-12-2022 upon evaluation today patient appears to be doing well in regard to his right foot ulcer which is almost completely healed I am actually very pleased with where things stand. I  do not see any signs of active infection at this time. Electronic Signature(s) Signed: 06/12/2022 8:17:14 AM By: Worthy Keeler PA-C Entered By: Worthy Keeler on 06/12/2022 08:17:13 Physical Exam Details -------------------------------------------------------------------------------- [name] ([id number]) [id number] Page 3 of 8 Patient Name: Date of Service: [name] [facility] [name]. 06/12/2022 7:45 A M Medical Record Number: [id number] Patient Account Number: [id number] Date of Birth/Sex: Treating RN: December 18, 1940 (81 y.o. [name] Primary Care Provider: [name] Other Clinician: Referring Provider: Treating Provider/Extender: [name] in Treatment: 13 Constitutional Well-nourished and well-hydrated in no acute distress. Respiratory normal breathing without difficulty. Psychiatric this patient is able to make decisions and demonstrates good insight into disease process. Alert and Oriented x 3. pleasant and cooperative. Notes Upon inspection patient's wound bed actually showed signs of good granulation and epithelization at this point. Fortunately I do not see any signs of worsening overall and I  feel like that he is actually headed in the right direction here. No fevers, chills, nausea, vomiting, or diarrhea. Electronic Signature(s) Signed: 06/12/2022 8:17:44 AM By: Worthy Keeler PA-C Entered By: Worthy Keeler on 06/12/2022 08:17:44 -------------------------------------------------------------------------------- Physician Orders Details Patient Name: Date of Service: [name] [facility] [name]. 06/12/2022 7:45 A M Medical Record Number: [id number] Patient Account Number: [id number] Date of Birth/Sex: Treating RN: 1940/09/19 (81 y.o. [name] Primary Care Provider: [name] Other Clinician: Referring Provider: Treating Provider/Extender: [name] in Treatment: 21 Verbal / Phone Orders:  No Diagnosis Coding Follow-up Appointments Return Appointment in 1 week. Nurse Visit as needed Bathing/ Shower/ Hygiene May shower with wound dressing protected with water repellent cover or cast protector. Edema Control - Lymphedema / Segmental Compressive Device / Other Tubigrip double layer applied - tubi E double layer Elevate, Exercise Daily and A void Standing for Long Periods of Time. Elevate legs to the level of the heart and pump ankles as often as possible Elevate leg(s) parallel to the floor when sitting. Off-Loading Right Lower Extremity Open toe surgical shoe with peg assist. Wound Treatment Wound #2 - Foot Wound Laterality: Plantar, Right Cleanser: Wound Cleanser 3 x Per Week/30 Days Discharge Instructions: Wash your hands with soap and water. Remove old dressing, discard into plastic bag and place into trash. Cleanse the wound with Wound Cleanser prior to applying a clean dressing using gauze sponges, not tissues or cotton balls. Do not scrub or use excessive force. Pat dry using gauze sponges, not tissue or cotton balls. Prim Dressing: Silvercel Small 2x2 (in/in) 3 x Per Week/30 Days ary Discharge Instructions: Apply Silvercel Small 2x2 (in/in) as instructed BURDETTE, [name] ([id number]) [id number] Page 4 of 8 Compression Wrap: 3-LAYER WRAP - Profore Lite LF 3 Multilayer Compression Bandaging System 3 x Per Week/30 Days Discharge Instructions: Apply 3 multi-layer wrap as prescribed. Electronic Signature(s) Signed: 06/12/2022 5:02:52 PM By: Worthy Keeler PA-C Signed: 06/12/2022 5:27:53 PM By: Rosalio Loud MSN RN CNS WTA Previous Signature: 06/12/2022 8:03:31 AM Version By: Rosalio Loud MSN RN CNS WTA Entered By: Rosalio Loud on 06/12/2022 08:29:33 -------------------------------------------------------------------------------- Problem List Details Patient Name: Date of Service: [name] [facility] [name]. 06/12/2022 7:45 A M Medical Record  Number: [id number] Patient Account Number: [id number] Date of Birth/Sex: Treating RN: [birth year] (81 y.o. [name] Primary Care Provider: [name] Other Clinician: Referring Provider: Treating Provider/Extender: [name] in Treatment: 21 Active Problems ICD-10 Encounter Code Description Active Date MDM Diagnosis I87.333 Chronic venous hypertension (idiopathic) with ulcer and inflammation of 01/16/2022 No Yes bilateral lower extremity L97.812 Non-pressure chronic ulcer of other part of right lower leg with fat layer 01/16/2022 No Yes exposed L97.822 Non-pressure chronic ulcer of other part of left lower leg with fat layer exposed 01/16/2022 No Yes G90.09 Other idiopathic peripheral autonomic neuropathy 01/16/2022 No Yes L97.522 Non-pressure chronic ulcer of other part of left foot with fat layer exposed 01/16/2022 No Yes L97.512 Non-pressure chronic ulcer of other part of right foot with fat layer exposed 01/16/2022 No Yes I10 Essential (primary) hypertension 01/16/2022 No Yes I50.42 Chronic combined systolic (congestive) and diastolic (congestive) heart failure 01/16/2022 No Yes I48.0 Paroxysmal atrial fibrillation 01/16/2022 No Yes [name], [name] ([id number]) [id number] Page 5 of 8 I42.0 Dilated cardiomyopathy 01/16/2022 No Yes Z79.01 Long term (current) use of anticoagulants 01/16/2022 No Yes I25.10 Atherosclerotic heart disease of native coronary artery without angina pectoris 01/16/2022 No Yes N18.32 Chronic kidney disease, stage  3b 01/16/2022 No Yes Inactive Problems Resolved Problems Electronic Signature(s) Signed: 06/12/2022 8:10:42 AM By: Worthy Keeler PA-C Entered By: Worthy Keeler on 06/12/2022 08:10:41 -------------------------------------------------------------------------------- Progress Note Details Patient Name: Date of Service: [name] [facility] [name]. 06/12/2022 7:45 A M Medical Record Number: [id number] Patient  Account Number: [id number] Date of Birth/Sex: Treating RN: 1941-05-18 (81 y.o. [name] Primary Care Provider: [name] Other Clinician: Referring Provider: Treating Provider/Extender: [name] in Treatment: 21 Subjective Chief Complaint Information obtained from Patient Multiple LE Ulcers History of Present Illness (HPI) 01-16-2022 upon evaluation today patient presents for initial inspection here in the clinic concerning issues that he has been having with wounds over the bilateral lower extremities and bilateral feet. Fortunately there does not appear to be any signs of significant infection at this point which is great news. Unfortunately his ABIs were registering at 0.49 on the left and 0.57 on the right here in the clinic on the screening today. With that being said I do think that it may possibly be true that we need to get him to formal arterial studies in order to see how things really are showing up. And the patient's not in disagreement with this for that reason we are going to make that referral as well. With that being said he does have some significant past medical history items of note detailed below. Patient does have chronic kidney disease stage IIIb, coronary artery disease, long-term use of anticoagulant therapy, dilated cardiomyopathy. Proximal atrial fibrillation, he is on Xarelto long-term. He also has congestive heart failure, hypertension, peripheral neuropathy not related to diabetes as he is only prediabetic with a hemoglobin A1c of 5.8, and chronic venous hypertension. He has recently seen Dr. [name] show his cardiologist who did discontinue the hydrochlorothiazide and the Lasix and felt that the patient was retaining more fluid than he should be. For that reason he was started on furosemide 20 mg twice daily. 7/27; patient with bilateral lower extremity wounds. These are largely venous although he had very poor ABIs on screening test  here. He has an appointment apparently tomorrow at vein and vascular for arterial studies. He was not too keen on the latter although I think I emphasized the need to do this both in terms of dealing with the current wounds and being prepared for further issues down the road. We have been using silver alginate on the wounds and Tubigrip's edema control is better per our intake nurse 02-06-2022 upon evaluation today patient appears to be doing well with regard to his wound. Fortunately there is no signs of infection he is actually healing for the most part on the bilateral feet. His legs are also doing much better though he has 1 new area that popped up on the right leg. Fortunately there is no [name], [name] ([id number]) [id number] Page 6 of 8 evidence of active infection of note his ABIs and TBI's were poor on the vascular screening on the 31st with the left being worse than the right. For that reason I am going to recommend he still continue to follow-up with vascular to discuss angioplasty/stenting. 8-124-23 upon evaluation today patient appears to be doing well currently in regard to his wound on the leg although he has several other blisters that are showing up at this point. I do believe that we need to try to do something to get his swelling under control this could be of utmost importance as far as getting this healed  is concerned. 03-06-2022 upon evaluation today patient appears to be doing well currently in regard to his wounds. In fact the legs are drying up and seem to be healing quite nicely the lateral wounds are still weeping a little bit about the medial side actually appears to be pretty much dry and closed. The plantar aspect of his foot still is draining a bit but again it seems to be doing much better which is great news.[name] 03-13-2022 upon evaluation patient appears to be almost completely healed. His leg is doing excellent and his foot is just about  closed. With that being said he did see vascular and they actually discussed the possibility of doing an arteriogram/angiogram in order to see if anything needed to be the balloons were stented open. With that being said he decided not to do that since things are healing so well right now and he is going to consider that for future they plan to see him back in November. 03-21-2022 upon evaluation today patient appears to be doing a little worse in regard to his wound on the foot. The wound keeps seeming to pull together unfortunately which is causing a trapped some fluid is just not healing quite as well as would like to see. With that being said I am going to try to remove a bit more of the overgrowth callus tissue today and hopefully that can help get this to seal up much more effectively and quickly. Fortunately I see no signs of infection of the foot although the leg on the right does seem to show some signs of erythema today I do think we need to address that currently. This looks to be cellulitis. 03-31-2022 upon evaluation today patient appears to be doing better in regard to the cellulitis I am pleased in that regard he is still taking the antibiotics. Fortunately I do not see any evidence of active infection locally or systemically at this time which is great news. No fevers, chills, nausea, vomiting, or diarrhea. 04-10-2022 upon evaluation today patient appears to be doing well currently in regard to his foot ulcer I think were very close to complete resolution which is great news. Fortunately I do not see any signs of active infection locally or systemically at this time. No fevers, chills, nausea, vomiting, or diarrhea. 04-24-2022 upon evaluation today patient appears to be doing about the same in regard to his foot ulcer. Were not really seeing much in the way of improvements here. With that being said I do believe that he is tolerating the dressing changes without complication. No fevers,  chills, nausea, vomiting, or diarrhea. With that being said the wound on his right plantar foot just does not seem to be making progress here. I do believe that he may benefit from vascular intervention which we previously discussed but he is previously wanted to hold off on. However at this point I think that this is becoming more crucial and he did ask me about getting in touch with the vascular doctor in order to see if they can get him scheduled sooner his next follow-up until like the end of December. I am more than happy to do this as I think it would be definitely beneficial for him. 05-01-2022 upon evaluation patient's wound bed actually showed signs of good granulation and epithelization at this point all things considered. Still his ABIs are very low is about 50% on each leg. With that being said he does have a follow-up with vascular in about 2  weeks maybe 2-1/2 weeks where he is going to have the angiogram for the right leg and then subsequently the left leg will be to follow. Nonetheless I think this is can make a big difference in getting the wounds healed to be honest. Fortunately I do not see any signs of active infection at this point. 11/9; patient arrives in clinic with 2 new superficial wounds on the right leg and right ankle. Both of these and the same condition of the original wound. In terms of the wound on his plantar foot about the same. We have been using collagen. His arterial review/angiogram is due for November 21 05-15-2022 upon evaluation today patient appears to be doing well currently in regard to his wounds all things considered. He actually has an appointment upcoming with the vascular surgeon where he is going to be undergoing intervention. Obviously I think this could be beneficial as far as helping this right leg and foot to heal it also allows to be able to put compression on which I think is much needed. He voiced understanding. That is on 21  November.. 05-29-2022 upon evaluation today patient appears to be doing significantly better in regard to his leg in general he seems to have much better blood flow compared to previous. Fortunately there does not appear to be any signs of infection locally nor systemically at this time. The good news is post his intervention procedure with Dr. Delana Meyer he only has 10% residual blockage in the right leg and to be honest this already looks much better from a color perspective I am very pleased with where we stand. 06-05-2022 upon evaluation today patient appears to be doing well currently in regard to his wounds. He has been tolerating the dressing changes the legs all appear to be healed. In regard to the foot this is measuring much smaller and doing much better and overall I am extremely pleased with where we stand currently. I do not see any signs of active infection locally or systemically at this time. 06-12-2022 upon evaluation today patient appears to be doing well in regard to his right foot ulcer which is almost completely healed I am actually very pleased with where things stand. I do not see any signs of active infection at this time. Objective Constitutional Well-nourished and well-hydrated in no acute distress. Vitals Time Taken: 7:47 AM, Height: 74 in, Weight: 244 lbs, BMI: 31.3, Temperature: 98.1 F, Pulse: 70 bpm, Respiratory Rate: 16 breaths/min, Blood Pressure: 138/69 mmHg. Respiratory normal breathing without difficulty. Psychiatric this patient is able to make decisions and demonstrates good insight into disease process. Alert and Oriented x 3. pleasant and cooperative. General Notes: Upon inspection patient's wound bed actually showed signs of good granulation and epithelization at this point. Fortunately I do not see any signs of worsening overall and I feel like that he is actually headed in the right direction here. No fevers, chills, nausea, vomiting, or  diarrhea. [name], [name] ([id number]) [id number] Page 7 of 8 Integumentary (Hair, Skin) Wound #2 status is Open. Original cause of wound was Gradually Appeared. The date acquired was: 12/28/2020. The wound has been in treatment 21 weeks. The wound is located on the [place]. The wound measures 0.6cm length x 0.1cm width x 0.2cm depth; 0.047cm^2 area and 0.009cm^3 volume. There is Fat Layer (Subcutaneous Tissue) exposed. There is a medium amount of serosanguineous drainage noted. There is small (1-33%) red granulation within the wound bed. There is no necrotic tissue within the wound  bed. Assessment Active Problems ICD-10 Chronic venous hypertension (idiopathic) with ulcer and inflammation of bilateral lower extremity Non-pressure chronic ulcer of other part of right lower leg with fat layer exposed Non-pressure chronic ulcer of other part of left lower leg with fat layer exposed Other idiopathic peripheral autonomic neuropathy Non-pressure chronic ulcer of other part of left foot with fat layer exposed Non-pressure chronic ulcer of other part of right foot with fat layer exposed Essential (primary) hypertension Chronic combined systolic (congestive) and diastolic (congestive) heart failure Paroxysmal atrial fibrillation Dilated cardiomyopathy Long term (current) use of anticoagulants Atherosclerotic heart disease 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 Nurse Visit as needed Bathing/ Shower/ Hygiene: May shower with wound dressing protected with water repellent cover or cast protector. Edema Control - Lymphedema / Segmental Compressive Device / Other: Tubigrip double layer applied - tubi E double layer Elevate, Exercise Daily and Avoid Standing for Long Periods of Time. Elevate legs to the level of the heart and pump ankles as often as  possible Elevate leg(s) parallel to the floor when sitting. Off-Loading: Open toe surgical shoe with peg assist. WOUND #2: - Foot Wound Laterality: Plantar, Right Cleanser: Wound Cleanser 3 x Per Week/30 Days Discharge Instructions: Wash your hands with soap and water. Remove old dressing, discard into plastic bag and place into trash. Cleanse the wound with Wound Cleanser prior to applying a clean dressing using gauze sponges, not tissues or cotton balls. Do not scrub or use excessive force. Pat dry using gauze sponges, not tissue or cotton balls. Prim Dressing: Prisma 4.34 (in) 3 x Per Week/30 Days ary Discharge Instructions: Moisten w/normal saline or sterile water; Cover wound as directed. Do not remove from wound bed. Com pression Wrap: 3-LAYER WRAP - Profore Lite LF 3 Multilayer Compression Bandaging System 3 x Per Week/30 Days Discharge Instructions: Apply 3 multi-layer wrap as prescribed. 1. I do believe that he is doing excellent in regard to this right foot ulcer I think we are very close to complete resolution. 2. I am also can recommend that we have the patient continue to monitor for any signs of infection or worsening of see if anything changes he should let me know right now we will keep him in the compression wrap which is doing great on the right leg he does have compression socks for the left leg and is also looking toward getting a vascular procedure done on this left leg they should be calling them to get that scheduled. He is open that he feels good on that side as he does on the right the right leg is showing excellent blood flow as of his recheck yesterday. We will see patient back for reevaluation in 1 week here in the clinic. If anything worsens or changes patient will contact our office for additional recommendations. Electronic Signature(s) Signed: 06/12/2022 8:18:19 AM By: Worthy Keeler PA-C Entered By: Worthy Keeler on 06/12/2022 08:18:19 [name], [name]  ([id number]) [id number] Page 8 of 8 -------------------------------------------------------------------------------- SuperBill Details Patient Name: Date of Service: [name] [facility] [name]. 06/12/2022 Medical Record Number: [id number] Patient Account Number: [id number] Date of Birth/Sex: Treating RN: [birth year] (81 y.o. [name] Primary Care Provider: [name] Other Clinician: Referring Provider: Treating Provider/Extender: [name] in Treatment: 21 Diagnosis Coding ICD-10 Codes Code Description [phone] Chronic venous hypertension (idiopathic) with ulcer and inflammation of bilateral lower extremity L97.812 Non-pressure chronic ulcer of  other part of right lower leg with fat layer exposed L97.822 Non-pressure chronic ulcer of other part of left lower leg with fat layer exposed G90.09 Other idiopathic peripheral autonomic neuropathy L97.522 Non-pressure chronic ulcer of other part of left foot with fat layer exposed L97.512 Non-pressure chronic ulcer of other part of right foot with fat layer exposed I10 Essential (primary) hypertension I50.42 Chronic combined systolic (congestive) and diastolic (congestive) heart failure I48.0 Paroxysmal atrial fibrillation I42.0 Dilated cardiomyopathy Z79.01 Long term (current) use of anticoagulants I25.10 Atherosclerotic heart disease of native coronary artery without angina pectoris N18.32 Chronic kidney disease, stage 3b Facility Procedures : CPT4 Code: 78588502 Description: 99213 - WOUND CARE VISIT-LEV 3 EST PT Modifier: Quantity: 1 : CPT4 Code: 77412878 Description: (Facility Use Only) [id number] - APPLY MULTLAY COMPRS LWR RT LEG Modifier: Quantity: 1 Physician Procedures : CPT4 Code Description Modifier [id number] - WC PHYS LEVEL 3 - EST PT ICD-10 Diagnosis Description I87.333 Chronic venous hypertension (idiopathic) with ulcer and inflammation of bilateral lower extremity L97.812  Non-pressure chronic ulcer of other  part of right lower leg with fat layer exposed L97.822 Non-pressure chronic ulcer of other part of left lower leg with fat layer exposed G90.09 Other idiopathic peripheral autonomic neuropathy Quantity: 1 Electronic Signature(s) Signed: 06/12/2022 8:34:00 AM By: Rosalio Loud MSN RN CNS WTA Signed: 06/12/2022 5:02:52 PM By: Worthy Keeler PA-C Previous Signature: 06/12/2022 8:18:59 AM Version By: Worthy Keeler PA-C Entered By: Rosalio Loud on 06/12/2022 08:34:00

## 2022-06-12 NOTE — Progress Notes (Signed)
PAXON, PROPES ([id number]) [id number] Page 1 of 10 Visit Report for 06/12/2022 Arrival Information Details Patient Name: Date of Service: Rupert Stacks [facility] D. 06/12/2022 7:45 A M Medical Record Number: [id number] Patient Account Number: [id number] Date of Birth/Sex: Treating RN: 27-Dec-1940 (81 y.o. [name] Primary Care Sole Lengacher: [name] Other Clinician: Referring Ryott Rafferty: Treating [place] Inthavong/Extender: [name] in Treatment: 21 Visit Information History Since Last Visit Added or deleted any medications: No Patient Arrived: [name] Any new allergies or adverse reactions: No Arrival Time: 07:45 Had a fall or experienced change in No Accompanied By: self activities of daily living that may affect Transfer Assistance: None risk of falls: Patient Identification Verified: Yes Hospitalized since last visit: No Secondary Verification Process Completed: Yes Pain Present Now: No Patient Requires Transmission-Based Precautions: No Patient Has Alerts: Yes Patient Alerts: ABI R 0.69 L 0.66 TBI R 0.41 L 0.16 Electronic Signature(s) Signed: 06/12/2022 5:27:53 PM By: Rosalio Loud MSN RN CNS WTA Entered By: Rosalio Loud on 06/12/2022 07:47:47 -------------------------------------------------------------------------------- Clinic Level of Care Assessment Details Patient Name: Date of Service: Rupert Stacks [facility] D. 06/12/2022 7:45 A M Medical Record Number: [id number] Patient Account Number: [id number] Date of Birth/Sex: Treating RN: September 30, 1940 (81 y.o. [name] Primary Care Rosamae Rocque: [name] Other Clinician: Referring Fernande Treiber: Treating Shariff Lasky/Extender: [name] in Treatment: 21 Clinic Level of Care Assessment Items TOOL 4 Quantity Score X- 1 0 Use when only an EandM is performed on FOLLOW-UP visit ASSESSMENTS - Nursing Assessment / Reassessment X- 1 10 Reassessment of Co-morbidities  (includes updates in patient status) X- 1 5 Reassessment of Adherence to Treatment Plan ASSESSMENTS - Wound and Skin A ssessment / Reassessment X - Simple Wound Assessment / Reassessment - one wound 1 5 Kuznicki, Bianca D ([id number]) [id number] Page 2 of 10 []  - 0 Complex Wound Assessment / Reassessment - multiple wounds []  - 0 Dermatologic / Skin Assessment (not related to wound area) ASSESSMENTS - Focused Assessment []  - 0 Circumferential Edema Measurements - multi extremities []  - 0 Nutritional Assessment / Counseling / Intervention []  - 0 Lower Extremity Assessment (monofilament, tuning fork, pulses) []  - 0 Peripheral Arterial Disease Assessment (using hand held doppler) ASSESSMENTS - Ostomy and/or Continence Assessment and Care []  - 0 Incontinence Assessment and Management []  - 0 Ostomy Care Assessment and Management (repouching, etc.) PROCESS - Coordination of Care X - Simple Patient / Family Education for ongoing care 1 15 []  - 0 Complex (extensive) Patient / Family Education for ongoing care X- 1 10 Staff obtains Programmer, systems, Records, T Results / Process Orders est []  - 0 Staff telephones HHA, Nursing Homes / Clarify orders / etc []  - 0 Routine Transfer to another Facility (non-emergent condition) []  - 0 Routine Hospital Admission (non-emergent condition) []  - 0 New Admissions / Biomedical engineer / Ordering NPWT Apligraf, etc. , []  - 0 Emergency Hospital Admission (emergent condition) X- 1 10 Simple Discharge Coordination []  - 0 Complex (extensive) Discharge Coordination PROCESS - Special Needs []  - 0 Pediatric / Minor Patient Management []  - 0 Isolation Patient Management []  - 0 Hearing / Language / Visual special needs []  - 0 Assessment of Community assistance (transportation, D/C planning, etc.) []  - 0 Additional assistance / Altered mentation []  - 0 Support Surface(s) Assessment (bed, cushion, seat,  etc.) INTERVENTIONS - Wound Cleansing / Measurement X - Simple Wound Cleansing - one wound 1 5 []  - 0 Complex Wound Cleansing - multiple wounds X- 1 5  Wound Imaging (photographs - any number of wounds) []  - 0 Wound Tracing (instead of photographs) X- 1 5 Simple Wound Measurement - one wound []  - 0 Complex Wound Measurement - multiple wounds INTERVENTIONS - Wound Dressings X - Small Wound Dressing one or multiple wounds 1 10 []  - 0 Medium Wound Dressing one or multiple wounds []  - 0 Large Wound Dressing one or multiple wounds []  - 0 Application of Medications - topical []  - 0 Application of Medications - injection INTERVENTIONS - Miscellaneous []  - 0 External ear exam []  - 0 Specimen Collection (cultures, biopsies, blood, body fluids, etc.) []  - 0 Specimen(s) / Culture(s) sent or taken to Lab for analysis VALENTE, FOSBERG D ([id number]) [id number] Page 3 of 10 []  - 0 Patient Transfer (multiple staff / Civil Service fast streamer / Similar devices) []  - 0 Simple Staple / Suture removal (25 or less) []  - 0 Complex Staple / Suture removal (26 or more) []  - 0 Hypo / Hyperglycemic Management (close monitor of Blood Glucose) []  - 0 Ankle / Brachial Index (ABI) - do not check if billed separately X- 1 5 Vital Signs Has the patient been seen at the hospital within the last three years: Yes Total Score: 85 Level Of Care: New/Established - Level 3 Electronic Signature(s) Signed: 06/12/2022 5:27:53 PM By: Rosalio Loud MSN RN CNS WTA Entered By: Rosalio Loud on 06/12/2022 08:32:52 -------------------------------------------------------------------------------- Compression Therapy Details Patient Name: Date of Service: Rupert Stacks [facility] D. 06/12/2022 7:45 A M Medical Record Number: [id number] Patient Account Number: [id number] Date of Birth/Sex: Treating RN: 1941-02-10 (81 y.o. [name] Primary Care Shahir Karen: [name] Other Clinician: Referring  Dahna Hattabaugh: Treating Roshawn Lacina/Extender: [name] in Treatment: 21 Compression Therapy Performed for Wound Assessment: Wound #2 Right,Plantar Foot Performed By: Clinician Rosalio Loud, RN Compression Type: Three Layer Post Procedure Diagnosis Same as Pre-procedure Electronic Signature(s) Signed: 06/12/2022 8:33:31 AM By: Rosalio Loud MSN RN CNS WTA Entered By: Rosalio Loud on 06/12/2022 08:33:31 -------------------------------------------------------------------------------- Encounter Discharge Information Details Patient Name: Date of Service: Rupert Stacks [facility] D. 06/12/2022 7:45 A M Medical Record Number: [id number] Patient Account Number: [id number] Date of Birth/Sex: Treating RN: [birth year] (81 y.o. [name] Primary Care Lynee Rosenbach: [name] Other Clinician: Referring Travarius Lange: Treating Meryl Ponder/Extender: [name] in Treatment: [address], [place] ([id number]) [id number] Page 4 of 10 Encounter Discharge Information Items Discharge Condition: Stable Ambulatory Status: Walker Discharge Destination: Home Transportation: Private Auto Accompanied By: self Schedule Follow-up Appointment: Yes Clinical Summary of Care: Electronic Signature(s) Signed: 06/12/2022 8:34:18 AM By: Rosalio Loud MSN RN CNS WTA Previous Signature: 06/12/2022 8:04:49 AM Version By: Rosalio Loud MSN RN CNS WTA Entered By: Rosalio Loud on 06/12/2022 08:34:18 -------------------------------------------------------------------------------- Lower Extremity Assessment Details Patient Name: Date of Service: Rupert Stacks [facility] D. 06/12/2022 7:45 A M Medical Record Number: [id number] Patient Account Number: [id number] Date of Birth/Sex: Treating RN: 1941/01/23 (81 y.o. [name] Primary Care Venetta Knee: [name] Other Clinician: Referring Temitayo Covalt: Treating Daxter Paule/Extender: [name] Weeks in Treatment:  21 Edema Assessment Assessed: [name]: No] [name]: No] [Left: Edema] [Right: :] Calf Left: Right: Point of Measurement: 32 cm From Medial Instep 39.5 cm Ankle Left: Right: Point of Measurement: 12 cm From Medial Instep 27 cm Vascular Assessment Pulses: Dorsalis Pedis Palpable: [Right:Yes] Electronic Signature(s) Signed: 06/12/2022 8:03:13 AM By: Rosalio Loud MSN RN CNS WTA Entered By: Rosalio Loud on 06/12/2022 08:03:13 Isaacs, Deidre Ala D ([id number]) [id number] Page 5 of 10 -------------------------------------------------------------------------------- Multi  Wound Chart Details Patient Name: Date of Service: Rupert Stacks [facility] D. 06/12/2022 7:45 A M Medical Record Number: [id number] Patient Account Number: [id number] Date of Birth/Sex: Treating RN: February 06, 1941 (81 y.o. [name] Primary Care Evelina Lore: [name] Other Clinician: Referring Kristiana Jacko: Treating Chrisotpher Rivero/Extender: [name] in Treatment: 21 Vital Signs Height(in): 74 Pulse(bpm): 33 Weight(lbs): 244 Blood Pressure(mmHg): 138/69 Body Mass Index(BMI): 31.3 Temperature(F): 98.1 Respiratory Rate(breaths/min): 16 [2:Photos:] [N/A:N/A] Right, Plantar Foot N/A N/A Wound Location: Gradually Appeared N/A N/A Wounding Event: Neuropathic Ulcer-Non Diabetic N/A N/A Primary Etiology: Congestive Heart Failure, N/A N/A Comorbid History: Hypertension 12/28/2020 N/A N/A Date Acquired: 21 N/A N/A Weeks of Treatment: Open N/A N/A Wound Status: No N/A N/A Wound Recurrence: 0.6x0.1x0.2 N/A N/A Measurements L x W x D (cm) 0.047 N/A N/A A (cm) : rea 0.009 N/A N/A Volume (cm) : 40.50% N/A N/A % Reduction in Area: 62.50% N/A N/A % Reduction in Volume: Full Thickness Without Exposed N/A N/A Classification: Support Structures Medium N/A N/A Exudate Amount: Serosanguineous N/A N/A Exudate Type: red, brown N/A N/A Exudate Color: Small (1-33%) N/A  N/A Granulation Amount: Red N/A N/A Granulation Quality: None Present (0%) N/A N/A Necrotic Amount: Fat Layer (Subcutaneous Tissue): Yes N/A N/A Exposed Structures: Fascia: No Tendon: No Muscle: No Joint: No Bone: No Medium (34-66%) N/A N/A Epithelialization: Treatment Notes Electronic Signature(s) Signed: 06/12/2022 5:27:53 PM By: Rosalio Loud MSN RN CNS WTA Entered By: Rosalio Loud on 06/12/2022 08:00:24 Roane, Deidre Ala D ([id number]) [id number] Page 6 of 10 -------------------------------------------------------------------------------- Multi-Disciplinary Care Plan Details Patient Name: Date of Service: Rupert Stacks [facility] D. 06/12/2022 7:45 A M Medical Record Number: [id number] Patient Account Number: [id number] Date of Birth/Sex: Treating RN: 1940-11-01 (81 y.o. [name] Primary Care Edwinna Rochette: [name] Other Clinician: Referring Mathilde Mcwherter: Treating Von Quintanar/Extender: [name] in Treatment: 21 Active Inactive Necrotic Tissue Nursing Diagnoses: Impaired tissue integrity related to necrotic/devitalized tissue Knowledge deficit related to management of necrotic/devitalized tissue Goals: Necrotic/devitalized tissue will be minimized in the wound bed Date Initiated: 03/31/2022 Target Resolution Date: 03/31/2022 Goal Status: Active Patient/caregiver will verbalize understanding of reason and process for debridement of necrotic tissue Date Initiated: 03/31/2022 Target Resolution Date: 03/31/2022 Goal Status: Active Interventions: Assess patient pain level pre-, during and post procedure and prior to discharge Provide education on necrotic tissue and debridement process Treatment Activities: Excisional debridement : 03/31/2022 Notes: Wound/Skin Impairment Nursing Diagnoses: Knowledge deficit related to ulceration/compromised skin integrity Goals: Patient/caregiver will verbalize understanding of skin care  regimen Date Initiated: 01/16/2022 Target Resolution Date: 02/16/2022 Goal Status: Active Ulcer/skin breakdown will have a volume reduction of 30% by week 4 Date Initiated: 01/16/2022 Target Resolution Date: 02/16/2022 Goal Status: Active Ulcer/skin breakdown will have a volume reduction of 50% by week 8 Date Initiated: 01/16/2022 Target Resolution Date: 03/19/2022 Goal Status: Active Ulcer/skin breakdown will have a volume reduction of 80% by week 12 Date Initiated: 01/16/2022 Target Resolution Date: 04/18/2022 Goal Status: Active Ulcer/skin breakdown will heal within 14 weeks Date Initiated: 01/16/2022 Target Resolution Date: 05/19/2022 Goal Status: Active Interventions: Assess patient/caregiver ability to obtain necessary supplies Assess patient/caregiver ability to perform ulcer/skin care regimen upon admission and as needed Assess ulceration(s) every visit Notes: FINNEAN, CERAMI ([id number]) [id number] Page 7 of 10 Electronic Signature(s) Signed: 06/12/2022 8:04:02 AM By: Rosalio Loud MSN RN CNS WTA Entered By: Rosalio Loud on 06/12/2022 08:04:02 -------------------------------------------------------------------------------- Pain Assessment Details Patient Name: Date of Service: Rupert Stacks [facility] D. 06/12/2022 7:45 A M Medical  Record Number: [id number] Patient Account Number: [id number] Date of Birth/Sex: Treating RN: 1940/12/04 (81 y.o. [name] Primary Care Dodger Sinning: [name] Other Clinician: Referring Zaydrian Batta: Treating Thresia Ramanathan/Extender: [name] in Treatment: 21 Active Problems Location of Pain Severity and Description of Pain Patient Has Paino No Site Locations Pain Management and Medication Current Pain Management: Electronic Signature(s) Signed: 06/12/2022 5:27:53 PM By: Rosalio Loud MSN RN CNS WTA Entered By: Rosalio Loud on 06/12/2022  07:52:43 -------------------------------------------------------------------------------- Patient/Caregiver Education Details Patient Name: Date of Service: Rupert Stacks [facility] D. 12/14/2023andnbsp7:45 A Lynetta Mare D ([id number]) [id number] Page 8 of 10 Medical Record Number: [id number] Patient Account Number: [id number] Date of Birth/Gender: Treating RN: 1941/04/03 (81 y.o. [name] Primary Care Physician: [name] Other Clinician: Referring Physician: Treating Physician/Extender: [name] in Treatment: 21 Education Assessment Education Provided To: Patient Education Topics Provided Wound/Skin Impairment: Handouts: Caring for Your Ulcer Methods: Explain/Verbal Responses: State content correctly Electronic Signature(s) Signed: 06/12/2022 5:27:53 PM By: Rosalio Loud MSN RN CNS WTA Entered By: Rosalio Loud on 06/12/2022 08:03:50 -------------------------------------------------------------------------------- Wound Assessment Details Patient Name: Date of Service: Rupert Stacks [facility] D. 06/12/2022 7:45 A M Medical Record Number: [id number] Patient Account Number: [id number] Date of Birth/Sex: Treating RN: March 18, 1941 (81 y.o. [name] Primary Care Glorian Mcdonell: [name] Other Clinician: Referring Rajni Holsworth: Treating Katrine Radich/Extender: [name] in Treatment: 21 Wound Status Wound Number: 2 Primary Etiology: Neuropathic Ulcer-Non Diabetic Wound Location: Right, Plantar Foot Wound Status: Open Wounding Event: Gradually Appeared Comorbid History: Congestive Heart Failure, Hypertension Date Acquired: 12/28/2020 Weeks Of Treatment: 21 Clustered Wound: No Photos Wound Measurements Length: (cm) 0.6 Width: (cm) 0.1 Depth: (cm) 0.2 Area: (cm) 0.047 Volume: (cm) 0.009 Huge, Bashir D ([id number]) % Reduction in Area: 40.5% % Reduction in Volume: 62.5% Epithelialization: Medium  (34-66%) [id number] Page 9 of 10 Wound Description Classification: Full Thickness Without Exposed Support Structures Exudate Amount: Medium Exudate Type: Serosanguineous Exudate Color: red, brown Foul Odor After Cleansing: No Slough/Fibrino No Wound Bed Granulation Amount: Small (1-33%) Exposed Structure Granulation Quality: Red Fascia Exposed: No Necrotic Amount: None Present (0%) Fat Layer (Subcutaneous Tissue) Exposed: Yes Tendon Exposed: No Muscle Exposed: No Joint Exposed: No Bone Exposed: No Treatment Notes Wound #2 (Foot) Wound Laterality: Plantar, Right Cleanser Wound Cleanser Discharge Instruction: Wash your hands with soap and water. Remove old dressing, discard into plastic bag and place into trash. Cleanse the wound with Wound Cleanser prior to applying a clean dressing using gauze sponges, not tissues or cotton balls. Do not scrub or use excessive force. Pat dry using gauze sponges, not tissue or cotton balls. Peri-Wound Care Topical Primary Dressing Silvercel Small 2x2 (in/in) Discharge Instruction: Apply Silvercel Small 2x2 (in/in) as instructed Secondary Dressing Secured With Compression Wrap 3-LAYER WRAP - Profore Lite LF 3 Multilayer Compression Bandaging System Discharge Instruction: Apply 3 multi-layer wrap as prescribed. Compression Stockings Add-Ons Electronic Signature(s) Signed: 06/12/2022 5:27:53 PM By: Rosalio Loud MSN RN CNS WTA Entered By: Rosalio Loud on 06/12/2022 07:59:07 -------------------------------------------------------------------------------- Vitals Details Patient Name: Date of Service: [name], RA LPH D. 06/12/2022 7:45 A M Medical Record Number: [id number] Patient Account Number: [id number] Date of Birth/Sex: Treating RN: 1941/05/21 (81 y.o. [name] Primary Care Daleiza Bacchi: [name] Other Clinician: Referring Yoel Kaufhold: Treating Zaelyn Barbary/Extender: [name] in  Treatment: 21 Vital Signs Time Taken: 07:47 Temperature (F): 98.1 BRADY, SCHILLER D ([id number]) [id number] Page 10 of 10 Height (in): 74 Pulse (bpm): 70 Weight (  lbs): 244 Respiratory Rate (breaths/min): 16 Body Mass Index (BMI): 31.3 Blood Pressure (mmHg): 138/69 Reference Range: 80 - 120 mg / dl Electronic Signature(s) Signed: 06/12/2022 5:27:53 PM By: Rosalio Loud MSN RN CNS WTA Entered By: Rosalio Loud on 06/12/2022 07:52:37

## 2022-06-18 DIAGNOSIS — E785 Hyperlipidemia, unspecified: Secondary | ICD-10-CM | POA: Diagnosis not present

## 2022-06-18 DIAGNOSIS — N1832 Chronic kidney disease, stage 3b: Secondary | ICD-10-CM | POA: Diagnosis not present

## 2022-06-18 DIAGNOSIS — R82998 Other abnormal findings in urine: Secondary | ICD-10-CM | POA: Diagnosis not present

## 2022-06-18 DIAGNOSIS — I509 Heart failure, unspecified: Secondary | ICD-10-CM | POA: Diagnosis not present

## 2022-06-18 DIAGNOSIS — R609 Edema, unspecified: Secondary | ICD-10-CM | POA: Diagnosis not present

## 2022-06-18 DIAGNOSIS — I4821 Permanent atrial fibrillation: Secondary | ICD-10-CM | POA: Diagnosis not present

## 2022-06-18 DIAGNOSIS — I10 Essential (primary) hypertension: Secondary | ICD-10-CM | POA: Diagnosis not present

## 2022-06-19 ENCOUNTER — Encounter: Payer: PPO | Admitting: Physician Assistant

## 2022-06-19 DIAGNOSIS — G9009 Other idiopathic peripheral autonomic neuropathy: Secondary | ICD-10-CM | POA: Diagnosis not present

## 2022-06-19 DIAGNOSIS — I87333 Chronic venous hypertension (idiopathic) with ulcer and inflammation of bilateral lower extremity: Secondary | ICD-10-CM | POA: Diagnosis not present

## 2022-06-19 DIAGNOSIS — L97512 Non-pressure chronic ulcer of other part of right foot with fat layer exposed: Secondary | ICD-10-CM | POA: Diagnosis not present

## 2022-06-19 NOTE — Progress Notes (Signed)
KILLIAN, SCHWER ([id number]) [id number] Page 1 of 9 Visit Report for 06/19/2022 Arrival Information Details Patient Name: Date of Service: [name] [facility] [name]. 06/19/2022 7:45 A M Medical Record Number: [id number] Patient Account Number: [id number] Date of Birth/Sex: Treating RN: [birth year], [name] (81 y.o. [name] Primary Care Tiffany Calmes: [name] Other Clinician: Referring Gershom Brobeck: Treating Lunden Stieber/Extender: [name] in Treatment: 57 Visit Information History Since Last Visit Added or deleted any medications: No Patient Arrived: [name] Any new allergies or adverse reactions: No Arrival Time: 08:00 Had a fall or experienced change in No Accompanied By: self activities of daily living that may affect Transfer Assistance: None risk of falls: Patient Identification Verified: Yes Hospitalized since last visit: No Secondary Verification Process Completed: Yes Pain Present Now: No Patient Requires Transmission-Based Precautions: No Patient Has Alerts: Yes Patient Alerts: ABI R 0.69 L 0.66 TBI R 0.41 L 0.16 Electronic Signature(s) Signed: 06/19/2022 3:51:33 PM By: Rosalio Loud MSN RN CNS WTA Entered By: Rosalio Loud on 06/19/2022 08:00:59 -------------------------------------------------------------------------------- Clinic Level of Care Assessment Details Patient Name: Date of Service: [name] [facility] [name]. 06/19/2022 7:45 A M Medical Record Number: [id number] Patient Account Number: [id number] Date of Birth/Sex: Treating RN: Jan [birth year], [name] (81 y.o. [name] Primary Care Ketura Sirek: [name] Other Clinician: Referring Lynesha Bango: Treating Darrnell Mangiaracina/Extender: [name] in Treatment: 22 Clinic Level of Care Assessment Items TOOL 1 Quantity Score []  - 0 Use when EandM and Procedure is performed on INITIAL visit ASSESSMENTS - Nursing Assessment / Reassessment []  - 0 General Physical Exam (combine w/  comprehensive assessment (listed just below) when performed on new pt. evals) []  - 0 Comprehensive Assessment (HX, ROS, Risk Assessments, Wounds Hx, etc.) ASSESSMENTS - Wound and Skin Assessment / Reassessment []  - 0 Dermatologic / Skin Assessment (not related to wound area) [name], [name] ([id number]) [id number] Page 2 of 9 ASSESSMENTS - Ostomy and/or Continence Assessment and Care []  - 0 Incontinence Assessment and Management []  - 0 Ostomy Care Assessment and Management (repouching, etc.) PROCESS - Coordination of Care []  - 0 Simple Patient / Family Education for ongoing care []  - 0 Complex (extensive) Patient / Family Education for ongoing care []  - 0 Staff obtains Programmer, systems, Records, T Results / Process Orders est []  - 0 Staff telephones HHA, Nursing Homes / Clarify orders / etc []  - 0 Routine Transfer to another Facility (non-emergent condition) []  - 0 Routine Hospital Admission (non-emergent condition) []  - 0 New Admissions / Biomedical engineer / Ordering NPWT Apligraf, etc. , []  - 0 Emergency Hospital Admission (emergent condition) PROCESS - Special Needs []  - 0 Pediatric / Minor Patient Management []  - 0 Isolation Patient Management []  - 0 Hearing / Language / Visual special needs []  - 0 Assessment of Community assistance (transportation, [name]/C planning, etc.) []  - 0 Additional assistance / Altered mentation []  - 0 Support Surface(s) Assessment (bed, cushion, seat, etc.) INTERVENTIONS - Miscellaneous []  - 0 External ear exam []  - 0 Patient Transfer (multiple staff / Civil Service fast streamer / Similar devices) []  - 0 Simple Staple / Suture removal (25 or less) []  - 0 Complex Staple / Suture removal (26 or more) []  - 0 Hypo/Hyperglycemic Management (do not check if billed separately) []  - 0 Ankle / Brachial Index (ABI) - do not check if billed separately Has the patient been seen at the hospital within the last three years: Yes Total  Score: 0 Level Of Care: ____ Electronic Signature(s) Signed: 06/19/2022 3:51:33 PM By:  Rosalio Loud MSN RN CNS WTA Entered By: Rosalio Loud on 06/19/2022 08:19:31 -------------------------------------------------------------------------------- Encounter Discharge Information Details Patient Name: Date of Service: [name] [facility] [name]. 06/19/2022 7:45 A M Medical Record Number: [id number] Patient Account Number: [id number] Date of Birth/Sex: Treating RN: [name] (81 y.o. [name] Primary Care Macario Shear: [name] Other Clinician: Referring [name]: Treating Ransome Helwig/Extender: [name] in Treatment: 22 Encounter Discharge Information Items Post Procedure [place], [place] ([id number]) [id number] Page 3 of 9 Discharge Condition: Stable Temperature (F): 97.5 Ambulatory Status: Walker Pulse (bpm): 52 Discharge Destination: Home Respiratory Rate (breaths/min): 16 Transportation: Private Auto Blood Pressure (mmHg): 101/45 Accompanied By: self Schedule Follow-up Appointment: Yes Clinical Summary of Care: Electronic Signature(s) Signed: 06/19/2022 3:51:33 PM By: Rosalio Loud MSN RN CNS WTA Entered By: Rosalio Loud on 06/19/2022 08:20:55 -------------------------------------------------------------------------------- Lower Extremity Assessment Details Patient Name: Date of Service: [name] [facility] [name]. 06/19/2022 7:45 A M Medical Record Number: [id number] Patient Account Number: [id number] Date of Birth/Sex: Treating RN: [birth year][name] (81 y.o. [name] Primary Care Tenleigh Byer: [name] Other Clinician: Referring Sonal Dorwart: Treating Malone Admire/Extender: [name] in Treatment: 22 Edema Assessment Assessed: [name]: No] [name]: No] [Left: Edema] [Right: :] Calf Left: Right: Point of Measurement: 32 cm From Medial Instep 39 cm Ankle Left: Right: Point of Measurement: 12 cm From Medial Instep  27 cm Vascular Assessment Pulses: Dorsalis Pedis Palpable: [Right:Yes] Electronic Signature(s) Signed: 06/19/2022 3:51:33 PM By: Rosalio Loud MSN RN CNS WTA Entered By: Rosalio Loud on 06/19/2022 08:11:20 [name], [name] ([id number]) [id number] Page 4 of 9 -------------------------------------------------------------------------------- Multi Wound Chart Details Patient Name: Date of Service: [name] [facility] [name]. 06/19/2022 7:45 A M Medical Record Number: [id number] Patient Account Number: [id number] Date of Birth/Sex: Treating RN: [birth year] (81 y.o. [name] Primary Care Konni Kesinger: [name] Other Clinician: Referring Webb Weed: Treating Maycie Luera/Extender: [name] in Treatment: 22 Vital Signs Height(in): 74 Pulse(bpm): 52 Weight(lbs): 244 Blood Pressure(mmHg): 101/45 Body Mass Index(BMI): 31.3 Temperature(F): 97.5 Respiratory Rate(breaths/min): 16 [2:Photos:] [N/A:N/A] Right, Plantar Foot N/A N/A Wound Location: Gradually Appeared N/A N/A Wounding Event: Neuropathic Ulcer-Non Diabetic N/A N/A Primary Etiology: Congestive Heart Failure, N/A N/A Comorbid History: Hypertension 12/28/2020 N/A N/A Date Acquired: 22 N/A N/A [name] of Treatment: Open N/A N/A Wound Status: No N/A N/A Wound Recurrence: 0.4x0.1x0.2 N/A N/A Measurements L x W x [name] (cm) 0.031 N/A N/A A (cm) : rea 0.006 N/A N/A Volume (cm) : 60.80% N/A N/A % Reduction in Area: 75.00% N/A N/A % Reduction in Volume: Full Thickness Without Exposed N/A N/A Classification: Support Structures Medium N/A N/A Exudate Amount: Serosanguineous N/A N/A Exudate Type: red, brown N/A N/A Exudate Color: Small (1-33%) N/A N/A Granulation Amount: Red N/A N/A Granulation Quality: None Present (0%) N/A N/A Necrotic Amount: Fat Layer (Subcutaneous Tissue): Yes N/A N/A Exposed Structures: Fascia: No Tendon: No Muscle: No Joint: No Bone: No Medium  (34-66%) N/A N/A Epithelialization: Treatment Notes Electronic Signature(s) Signed: 06/19/2022 3:51:33 PM By: Rosalio Loud MSN RN CNS WTA Entered By: Rosalio Loud on 06/19/2022 08:17:32 [name], [name] ([id number]) [id number] Page 5 of 9 -------------------------------------------------------------------------------- Multi-Disciplinary Care Plan Details Patient Name: Date of Service: [name] [facility] [name]. 06/19/2022 7:45 A M Medical Record Number: [id number] Patient Account Number: [id number] Date of Birth/Sex: Treating RN: 08/22/[name] (81 y.o. [name] Primary Care Ajamu Maxon: [name] Other Clinician: Referring Lionardo Haze: Treating Clarkson Rosselli/Extender: [name] in Treatment: 22 Active Inactive Necrotic Tissue Nursing  Diagnoses: Impaired tissue integrity related to necrotic/devitalized tissue Knowledge deficit related to management of necrotic/devitalized tissue Goals: Necrotic/devitalized tissue will be minimized in the wound bed Date Initiated: 03/31/2022 Target Resolution Date: 03/31/2022 Goal Status: Active Patient/caregiver will verbalize understanding of reason and process for debridement of necrotic tissue Date Initiated: 03/31/2022 Target Resolution Date: 03/31/2022 Goal Status: Active Interventions: Assess patient pain level pre-, during and post procedure and prior to discharge Provide education on necrotic tissue and debridement process Treatment Activities: Excisional debridement : 03/31/2022 Notes: Wound/Skin Impairment Nursing Diagnoses: Knowledge deficit related to ulceration/compromised skin integrity Goals: Patient/caregiver will verbalize understanding of skin care regimen Date Initiated: 01/16/2022 Target Resolution Date: 02/16/2022 Goal Status: Active Ulcer/skin breakdown will have a volume reduction of 30% by week 4 Date Initiated: 01/16/2022 Target Resolution Date: 02/16/2022 Goal Status:  Active Ulcer/skin breakdown will have a volume reduction of 50% by week 8 Date Initiated: 01/16/2022 Target Resolution Date: 03/19/2022 Goal Status: Active Ulcer/skin breakdown will have a volume reduction of 80% by week 12 Date Initiated: 01/16/2022 Target Resolution Date: 04/18/2022 Goal Status: Active Ulcer/skin breakdown will heal within 14 [name] Date Initiated: 01/16/2022 Target Resolution Date: 05/19/2022 Goal Status: Active Interventions: Assess patient/caregiver ability to obtain necessary supplies Assess patient/caregiver ability to perform ulcer/skin care regimen upon admission and as needed Assess ulceration(s) every visit Notes: Electronic Signature(s) Signed: 06/19/2022 3:51:33 PM By: Rosalio Loud MSN RN CNS WTA Entered By: Rosalio Loud on 06/19/2022 08:19:56 [name], [name] ([id number]) [id number] Page 6 of 9 -------------------------------------------------------------------------------- Pain Assessment Details Patient Name: Date of Service: [name] [facility] [name]. 06/19/2022 7:45 A M Medical Record Number: [id number] Patient Account Number: [id number] Date of Birth/Sex: Treating RN: [name], [name] (80 y.o. [name] Primary Care Reece Fehnel: [name] Other Clinician: Referring Pessy Delamar: Treating Salayah Meares/Extender: [name] in Treatment: 22 Active Problems Location of Pain Severity and Description of Pain Patient Has Paino No Site Locations Pain Management and Medication Current Pain Management: Electronic Signature(s) Signed: 06/19/2022 3:51:33 PM By: Rosalio Loud MSN RN CNS WTA Entered By: Rosalio Loud on 06/19/2022 08:04:22 -------------------------------------------------------------------------------- Patient/Caregiver Education Details Patient Name: Date of Service: [name] [facility] [name]. 12/21/2023andnbsp7:45 A M Medical Record Number: [id number] Patient Account Number: [id number] Date of Birth/Gender:  Treating RN: [birth year]-[name] (81 y.o. [name] Primary Care Physician: [name] Other Clinician: Referring Physician: Treating Physician/Extender: [name] in Treatment: [address] [place] ([id number]) [id number] Page 7 of 9 Education Assessment Education Provided To: Patient Education Topics Provided Wound Debridement: Handouts: Wound Debridement Methods: Explain/Verbal Responses: State content correctly Electronic Signature(s) Signed: 06/19/2022 3:51:33 PM By: Rosalio Loud MSN RN CNS WTA Entered By: Rosalio Loud on 06/19/2022 08:19:52 -------------------------------------------------------------------------------- Wound Assessment Details Patient Name: Date of Service: [name] [facility] [name]. 06/19/2022 7:45 A M Medical Record Number: [id number] Patient Account Number: [id number] Date of Birth/Sex: Treating RN: [name][birth year] (81 y.o. [name] Primary Care Sion Thane: [name] Other Clinician: Referring Dayona Shaheen: Treating Lamount Bankson/Extender: [name] in Treatment: 22 Wound Status Wound Number: 2 Primary Etiology: Neuropathic Ulcer-Non Diabetic Wound Location: Right, Plantar Foot Wound Status: Open Wounding Event: Gradually Appeared Comorbid History: Congestive Heart Failure, Hypertension Date Acquired: 12/28/2020 [name] Of Treatment: 22 Clustered Wound: No Photos Wound Measurements Length: (cm) 0.4 Width: (cm) 0.1 Depth: (cm) 0.2 Area: (cm) 0.031 Volume: (cm) 0.006 % Reduction in Area: 60.8% % Reduction in Volume: 75% Epithelialization: Medium (34-66%) Wound Description Classification: Full Thickness Without Exposed Support Exudate Amount: Medium Exudate Type: Serosanguineous  [name], [name] ([id number]) Exudate Color: red, brown Structures Foul Odor After Cleansing: No Slough/Fibrino No [id number] Page 8 of 9 Wound Bed Granulation Amount: Small (1-33%)  Exposed Structure Granulation Quality: Red Fascia Exposed: No Necrotic Amount: None Present (0%) Fat Layer (Subcutaneous Tissue) Exposed: Yes Tendon Exposed: No Muscle Exposed: No Joint Exposed: No Bone Exposed: No Treatment Notes Wound #2 (Foot) Wound Laterality: Plantar, Right Cleanser Wound Cleanser Discharge Instruction: Wash your hands with soap and water. Remove old dressing, discard into plastic bag and place into trash. Cleanse the wound with Wound Cleanser prior to applying a clean dressing using gauze sponges, not tissues or cotton balls. Do not scrub or use excessive force. Pat dry using gauze sponges, not tissue or cotton balls. Peri-Wound Care Topical Primary Dressing Silvercel Small 2x2 (in/in) Discharge Instruction: Apply Silvercel Small 2x2 (in/in) as instructed Secondary Dressing Secured With Compression Wrap 3-LAYER WRAP - Profore Lite LF 3 Multilayer Compression Bandaging System Discharge Instruction: Apply 3 multi-layer wrap as prescribed. Compression Stockings Add-Ons Electronic Signature(s) Signed: 06/19/2022 3:51:33 PM By: Rosalio Loud MSN RN CNS WTA Entered By: Rosalio Loud on 06/19/2022 08:10:53 -------------------------------------------------------------------------------- Vitals Details Patient Name: Date of Service: [name], [name]. 06/19/2022 7:45 A M Medical Record Number: [id number] Patient Account Number: [id number] Date of Birth/Sex: Treating RN: 10/[birth year]/[name] (81 y.o. [name] Primary Care Dareth Andrew: [name] Other Clinician: Referring Leia Coletti: Treating Anysha Frappier/Extender: [name] in Treatment: 22 Vital Signs Time Taken: 08:01 Temperature (F): 97.5 Height (in): 74 Pulse (bpm): 52 Weight (lbs): 244 Respiratory Rate (breaths/min): 16 Body Mass Index (BMI): 31.3 Blood Pressure (mmHg): 101/45 Reference Range: 80 - 120 mg / dl [name], [name] ([id number]) [id number] Page 9  of 9 Electronic Signature(s) Signed: 06/19/2022 3:51:33 PM By: Rosalio Loud MSN RN CNS WTA Entered By: Rosalio Loud on 06/19/2022 08:03:31

## 2022-06-19 NOTE — Progress Notes (Signed)
SEWELL, PITNER ([id number]) [id number] Page 1 of 9 Visit Report for 06/19/2022 Chief Complaint Document Details Patient Name: Date of Service: [name] [facility] [name]. 06/19/2022 7:45 A M Medical Record Number: [id number] Patient Account Number: [id number] Date of Birth/Sex: Treating RN: 06/18/1941 (81 y.o. [name] Primary Care Provider: [name] Other Clinician: Referring Provider: Treating Provider/Extender: [name] in Treatment: 22 Information Obtained from: Patient Chief Complaint Multiple LE Ulcers Electronic Signature(s) Signed: 06/19/2022 4:23:52 PM By: Worthy Keeler PA-C Entered By: Worthy Keeler on 06/19/2022 07:56:17 -------------------------------------------------------------------------------- Debridement Details Patient Name: Date of Service: [name] [facility] [name]. 06/19/2022 7:45 A M Medical Record Number: [id number] Patient Account Number: [id number] Date of Birth/Sex: Treating RN: 05/11/1941 (81 y.o. [name] Primary Care Provider: [name] Other Clinician: Referring Provider: Treating Provider/Extender: [name] in Treatment: 22 Debridement Performed for Assessment: Wound #2 Right,Plantar Foot Performed By: Physician Tommie Sams., PA-C Debridement Type: Debridement Level of Consciousness (Pre-procedure): Awake and Alert Pre-procedure Verification/Time Out Yes - 08:16 Taken: Start Time: 08:16 T Area Debrided (L x W): otal 0.4 (cm) x 0.1 (cm) = 0.04 (cm) Tissue and other material debrided: Non-Viable, Callus Level: Non-Viable Tissue Debridement Description: Selective/Open Wound Instrument: Curette Bleeding: None Response to Treatment: Procedure was tolerated well Level of Consciousness (Post- Awake and Alert procedure): Post Debridement Measurements of Total Wound [name], [name] ([id number]) [id number] Page 2 of 9 Length: (cm)  0.4 Width: (cm) 0.1 Depth: (cm) 0.3 Volume: (cm) 0.009 Character of Wound/Ulcer Post Debridement: Stable Post Procedure Diagnosis Same as Pre-procedure Electronic Signature(s) Signed: 06/19/2022 3:51:33 PM By: Rosalio Loud MSN RN CNS WTA Signed: 06/19/2022 4:23:52 PM By: Worthy Keeler PA-C Entered By: Rosalio Loud on 06/19/2022 08:18:49 -------------------------------------------------------------------------------- HPI Details Patient Name: Date of Service: [name], [name] [facility] [name]. 06/19/2022 7:45 A M Medical Record Number: [id number] Patient Account Number: [id number] Date of Birth/Sex: Treating RN: 06-20-1941 (82 y.o. [name] Primary Care Provider: [name] Other Clinician: Referring Provider: Treating Provider/Extender: [name] in Treatment: 22 History of Present Illness HPI Description: 01-16-2022 upon evaluation today patient presents for initial inspection here in the clinic concerning issues that he has been having with wounds over the bilateral lower extremities and bilateral feet. Fortunately there does not appear to be any signs of significant infection at this point which is great news. Unfortunately his ABIs were registering at 0.49 on the left and 0.57 on the right here in the clinic on the screening today. With that being said I do think that it may possibly be true that we need to get him to formal arterial studies in order to see how things really are showing up. And the patient's not in disagreement with this for that reason we are going to make that referral as well. With that being said he does have some significant past medical history items of note detailed below. Patient does have chronic kidney disease stage IIIb, coronary artery disease, long-term use of anticoagulant therapy, dilated cardiomyopathy. Proximal atrial fibrillation, he is on Xarelto long-term. He also has congestive heart failure, hypertension, peripheral neuropathy  not related to diabetes as he is only prediabetic with a hemoglobin A1c of 5.8, and chronic venous hypertension. He has recently seen Dr. [name] show his cardiologist who did discontinue the hydrochlorothiazide and the Lasix and felt that the patient was retaining more fluid than he should be. For that reason he was started on furosemide  20 mg twice daily. 7/27; patient with bilateral lower extremity wounds. These are largely venous although he had very poor ABIs on screening test here. He has an appointment apparently tomorrow at vein and vascular for arterial studies. He was not too keen on the latter although I think I emphasized the need to do this both in terms of dealing with the current wounds and being prepared for further issues down the road. We have been using silver alginate on the wounds and Tubigrip's edema control is better per our intake nurse 02-06-2022 upon evaluation today patient appears to be doing well with regard to his wound. Fortunately there is no signs of infection he is actually healing for the most part on the bilateral feet. His legs are also doing much better though he has 1 new area that popped up on the right leg. Fortunately there is no evidence of active infection of note his ABIs and TBI's were poor on the vascular screening on the 31st with the left being worse than the right. For that reason I am going to recommend he still continue to follow-up with vascular to discuss angioplasty/stenting. [id number] upon evaluation today patient appears to be doing well currently in regard to his wound on the leg although he has several other blisters that are showing up at this point. I do believe that we need to try to do something to get his swelling under control this could be of utmost importance as far as getting this healed is concerned. 03-06-2022 upon evaluation today patient appears to be doing well currently in regard to his wounds. In fact the legs are drying up and seem  to be healing quite nicely the lateral wounds are still weeping a little bit about the medial side actually appears to be pretty much dry and closed. The plantar aspect of his foot still is draining a bit but again it seems to be doing much better which is great news.[name] 03-13-2022 upon evaluation patient appears to be almost completely healed. His leg is doing excellent and his foot is just about closed. With that being said he did see vascular and they actually discussed the possibility of doing an arteriogram/angiogram in order to see if anything needed to be the balloons were stented open. With that being said he decided not to do that since things are healing so well right now and he is going to consider that for future they plan to see him back in November. 03-21-2022 upon evaluation today patient appears to be doing a little worse in regard to his wound on the foot. The wound keeps seeming to pull together unfortunately which is causing a trapped some fluid is just not healing quite as well as would like to see. With that being said I am going to try to remove a bit more of the overgrowth callus tissue today and hopefully that can help get this to seal up much more effectively and quickly. Fortunately I see no signs of infection of the foot although the leg on the right does seem to show some signs of erythema today I do think we need to address that currently. This looks to be cellulitis. [name], [name] ([id number]) [id number] Page 3 of 9 03-31-2022 upon evaluation today patient appears to be doing better in regard to the cellulitis I am pleased in that regard he is still taking the antibiotics. Fortunately I do not see any evidence of active infection locally or systemically at this time which is  great news. No fevers, chills, nausea, vomiting, or diarrhea. 04-10-2022 upon evaluation today patient appears to be doing well currently in regard to his foot ulcer I  think were very close to complete resolution which is great news. Fortunately I do not see any signs of active infection locally or systemically at this time. No fevers, chills, nausea, vomiting, or diarrhea. 04-24-2022 upon evaluation today patient appears to be doing about the same in regard to his foot ulcer. Were not really seeing much in the way of improvements here. With that being said I do believe that he is tolerating the dressing changes without complication. No fevers, chills, nausea, vomiting, or diarrhea. With that being said the wound on his right plantar foot just does not seem to be making progress here. I do believe that he may benefit from vascular intervention which we previously discussed but he is previously wanted to hold off on. However at this point I think that this is becoming more crucial and he did ask me about getting in touch with the vascular doctor in order to see if they can get him scheduled sooner his next follow-up until like the end of December. I am more than happy to do this as I think it would be definitely beneficial for him. 05-01-2022 upon evaluation patient's wound bed actually showed signs of good granulation and epithelization at this point all things considered. Still his ABIs are very low is about 50% on each leg. With that being said he does have a follow-up with vascular in about 2 weeks maybe 2-1/2 weeks where he is going to have the angiogram for the right leg and then subsequently the left leg will be to follow. Nonetheless I think this is can make a big difference in getting the wounds healed to be honest. Fortunately I do not see any signs of active infection at this point. 11/9; patient arrives in clinic with 2 new superficial wounds on the right leg and right ankle. Both of these and the same condition of the original wound. In terms of the wound on his plantar foot about the same. We have been using collagen. His arterial review/angiogram is due  for November 21 05-15-2022 upon evaluation today patient appears to be doing well currently in regard to his wounds all things considered. He actually has an appointment upcoming with the vascular surgeon where he is going to be undergoing intervention. Obviously I think this could be beneficial as far as helping this right leg and foot to heal it also allows to be able to put compression on which I think is much needed. He voiced understanding. That is on 21 November.. 05-29-2022 upon evaluation today patient appears to be doing significantly better in regard to his leg in general he seems to have much better blood flow compared to previous. Fortunately there does not appear to be any signs of infection locally nor systemically at this time. The good news is post his intervention procedure with Dr. Delana Meyer he only has 10% residual blockage in the right leg and to be honest this already looks much better from a color perspective I am very pleased with where we stand. 06-05-2022 upon evaluation today patient appears to be doing well currently in regard to his wounds. He has been tolerating the dressing changes the legs all appear to be healed. In regard to the foot this is measuring much smaller and doing much better and overall I am extremely pleased with where we stand currently. I  do not see any signs of active infection locally or systemically at this time. 06-12-2022 upon evaluation today patient appears to be doing well in regard to his right foot ulcer which is almost completely healed I am actually very pleased with where things stand. I do not see any signs of active infection at this time. 06-19-2022 upon evaluation patient actually appears to be making excellent progress at this point. Fortunately I do not see any signs of infection locally nor systemically which is great news and overall I am extremely pleased with where we are currently. In general I think that the patient is very close to  complete resolution in regard to his foot. Electronic Signature(s) Signed: 06/19/2022 1:50:02 PM By: Worthy Keeler PA-C Entered By: Worthy Keeler on 06/19/2022 13:50:02 -------------------------------------------------------------------------------- Physical Exam Details Patient Name: Date of Service: [name] [facility] [name]. 06/19/2022 7:45 A M Medical Record Number: [id number] Patient Account Number: [id number] Date of Birth/Sex: Treating RN: 1941/02/10 (81 y.o. [name] Primary Care Provider: [name] Other Clinician: Referring Provider: Treating Provider/Extender: [name] in Treatment: 38 Constitutional Well-nourished and well-hydrated in no acute distress. Respiratory normal breathing without difficulty. Psychiatric this patient is able to make decisions and demonstrates good insight into disease process. Alert and Oriented x 3. pleasant and cooperative. [name], [name] ([id number]) [id number] Page 4 of 9 Notes Upon inspection patient's wound bed did require sharp debridement to clearway some of the necrotic debris he tolerated this today without complication and postdebridement the wound bed is significantly improved. Electronic Signature(s) Signed: 06/19/2022 1:50:17 PM By: Worthy Keeler PA-C Entered By: Worthy Keeler on 06/19/2022 13:50:17 -------------------------------------------------------------------------------- Physician Orders Details Patient Name: Date of Service: [name] [facility] [name]. 06/19/2022 7:45 A M Medical Record Number: [id number] Patient Account Number: [id number] Date of Birth/Sex: Treating RN: August 10, 1940 (81 y.o. [name] Primary Care Provider: [name] Other Clinician: Referring Provider: Treating Provider/Extender: [name] in Treatment: 22 Verbal / Phone Orders: No Diagnosis Coding ICD-10 Coding Code Description [phone] Chronic venous  hypertension (idiopathic) with ulcer and inflammation of bilateral lower extremity L97.812 Non-pressure chronic ulcer of other part of right lower leg with fat layer exposed L97.822 Non-pressure chronic ulcer of other part of left lower leg with fat layer exposed G90.09 Other idiopathic peripheral autonomic neuropathy L97.522 Non-pressure chronic ulcer of other part of left foot with fat layer exposed L97.512 Non-pressure chronic ulcer of other part of right foot with fat layer exposed I10 Essential (primary) hypertension I50.42 Chronic combined systolic (congestive) and diastolic (congestive) heart failure I48.0 Paroxysmal atrial fibrillation I42.0 Dilated cardiomyopathy Z79.01 Long term (current) use of anticoagulants I25.10 Atherosclerotic heart disease of native coronary artery without angina pectoris N18.32 Chronic kidney disease, stage 3b Follow-up Appointments Return Appointment in 1 week. Nurse Visit as needed Bathing/ Shower/ Hygiene May shower with wound dressing protected with water repellent cover or cast protector. Edema Control - Lymphedema / Segmental Compressive Device / Other Tubigrip double layer applied - tubi E double layer Elevate, Exercise Daily and A void Standing for Long Periods of Time. Elevate legs to the level of the heart and pump ankles as often as possible Elevate leg(s) parallel to the floor when sitting. Off-Loading Right Lower Extremity Open toe surgical shoe with peg assist. Wound Treatment Wound #2 - Foot Wound Laterality: Plantar, Right Cleanser: Wound Cleanser 3 x Per Week/30 Days Discharge Instructions: Wash your hands with soap and water. Remove old dressing, discard  into plastic bag and place into trash. Cleanse the wound with Wound Cleanser prior to applying a clean dressing using gauze sponges, not tissues or cotton balls. Do not scrub or use excessive force. Pat dry using gauze sponges, not tissue or cotton balls. [name], [name]  ([id number]) [id number] Page 5 of 9 Prim Dressing: Silvercel Small 2x2 (in/in) 3 x Per Week/30 Days ary Discharge Instructions: Apply Silvercel Small 2x2 (in/in) as instructed Compression Wrap: 3-LAYER WRAP - Profore Lite LF 3 Multilayer Compression Bandaging System 3 x Per Week/30 Days Discharge Instructions: Apply 3 multi-layer wrap as prescribed. Electronic Signature(s) Signed: 06/19/2022 3:51:33 PM By: Rosalio Loud MSN RN CNS WTA Signed: 06/19/2022 4:23:52 PM By: Worthy Keeler PA-C Entered By: Rosalio Loud on 06/19/2022 18:29:93 -------------------------------------------------------------------------------- Problem List Details Patient Name: Date of Service: [name] [facility] [name]. 06/19/2022 7:45 A M Medical Record Number: [id number] Patient Account Number: [id number] Date of Birth/Sex: Treating RN: 12-03-1940 (81 y.o. [name] Primary Care Provider: [name] Other Clinician: Referring Provider: Treating Provider/Extender: [name] in Treatment: 22 Active Problems ICD-10 Encounter Code Description Active Date MDM Diagnosis I87.333 Chronic venous hypertension (idiopathic) with ulcer and inflammation of 01/16/2022 No Yes bilateral lower extremity L97.812 Non-pressure chronic ulcer of other part of right lower leg with fat layer 01/16/2022 No Yes exposed L97.822 Non-pressure chronic ulcer of other part of left lower leg with fat layer exposed 01/16/2022 No Yes G90.09 Other idiopathic peripheral autonomic neuropathy 01/16/2022 No Yes L97.522 Non-pressure chronic ulcer of other part of left foot with fat layer exposed 01/16/2022 No Yes L97.512 Non-pressure chronic ulcer of other part of right foot with fat layer exposed 01/16/2022 No Yes I10 Essential (primary) hypertension 01/16/2022 No Yes I50.42 Chronic combined systolic (congestive) and diastolic (congestive) heart failure 01/16/2022 No Yes [name], [name] ([id number])  [id number] Page 6 of 9 I48.0 Paroxysmal atrial fibrillation 01/16/2022 No Yes I42.0 Dilated cardiomyopathy 01/16/2022 No Yes Z79.01 Long term (current) use of anticoagulants 01/16/2022 No Yes I25.10 Atherosclerotic heart disease of native coronary artery without angina pectoris 01/16/2022 No Yes N18.32 Chronic kidney disease, stage 3b 01/16/2022 No Yes Inactive Problems Resolved Problems Electronic Signature(s) Signed: 06/19/2022 3:51:33 PM By: Rosalio Loud MSN RN CNS WTA Signed: 06/19/2022 4:23:52 PM By: Worthy Keeler PA-C Entered By: Rosalio Loud on 06/19/2022 08:20:07 -------------------------------------------------------------------------------- Progress Note Details Patient Name: Date of Service: [name], [name] LPH [name]. 06/19/2022 7:45 A M Medical Record Number: [id number] Patient Account Number: [id number] Date of Birth/Sex: Treating RN: March 13, 1941 (81 y.o. [name] Primary Care Provider: [name] Other Clinician: Referring Provider: Treating Provider/Extender: [name] in Treatment: 22 Subjective Chief Complaint Information obtained from Patient Multiple LE Ulcers History of Present Illness (HPI) 01-16-2022 upon evaluation today patient presents for initial inspection here in the clinic concerning issues that he has been having with wounds over the bilateral lower extremities and bilateral feet. Fortunately there does not appear to be any signs of significant infection at this point which is great news. Unfortunately his ABIs were registering at 0.49 on the left and 0.57 on the right here in the clinic on the screening today. With that being said I do think that it may possibly be true that we need to get him to formal arterial studies in order to see how things really are showing up. And the patient's not in disagreement with this for that reason we are going to make that referral as well. With that being said  he does have  some significant past medical history items of note detailed below. Patient does have chronic kidney disease stage IIIb, coronary artery disease, long-term use of anticoagulant therapy, dilated cardiomyopathy. Proximal atrial fibrillation, he is on Xarelto long-term. He also has congestive heart failure, hypertension, peripheral neuropathy not related to diabetes as he is only prediabetic with a hemoglobin A1c of 5.8, and chronic venous hypertension. He has recently seen Dr. [name] show his cardiologist who did discontinue the hydrochlorothiazide and the Lasix and felt that the patient was retaining more fluid than he should be. For that reason he was started on furosemide 20 mg twice daily. 7/27; patient with bilateral lower extremity wounds. These are largely venous although he had very poor ABIs on screening test here. He has an appointment apparently tomorrow at vein and vascular for arterial studies. He was not too keen on the latter although I think I emphasized the need to do this both in terms of dealing with the current wounds and being prepared for further issues down the road. We have been using silver alginate on the wounds and Tubigrip's edema control is better per our intake nurse [name], [name] ([id number]) [id number] Page 7 of 9 02-06-2022 upon evaluation today patient appears to be doing well with regard to his wound. Fortunately there is no signs of infection he is actually healing for the most part on the bilateral feet. His legs are also doing much better though he has 1 new area that popped up on the right leg. Fortunately there is no evidence of active infection of note his ABIs and TBI's were poor on the vascular screening on the 31st with the left being worse than the right. For that reason I am going to recommend he still continue to follow-up with vascular to discuss angioplasty/stenting. 8-124-23 upon evaluation today patient appears to be doing  well currently in regard to his wound on the leg although he has several other blisters that are showing up at this point. I do believe that we need to try to do something to get his swelling under control this could be of utmost importance as far as getting this healed is concerned. 03-06-2022 upon evaluation today patient appears to be doing well currently in regard to his wounds. In fact the legs are drying up and seem to be healing quite nicely the lateral wounds are still weeping a little bit about the medial side actually appears to be pretty much dry and closed. The plantar aspect of his foot still is draining a bit but again it seems to be doing much better which is great news.[name] 03-13-2022 upon evaluation patient appears to be almost completely healed. His leg is doing excellent and his foot is just about closed. With that being said he did see vascular and they actually discussed the possibility of doing an arteriogram/angiogram in order to see if anything needed to be the balloons were stented open. With that being said he decided not to do that since things are healing so well right now and he is going to consider that for future they plan to see him back in November. 03-21-2022 upon evaluation today patient appears to be doing a little worse in regard to his wound on the foot. The wound keeps seeming to pull together unfortunately which is causing a trapped some fluid is just not healing quite as well as would like to see. With that being said I am going to try to remove a bit  more of the overgrowth callus tissue today and hopefully that can help get this to seal up much more effectively and quickly. Fortunately I see no signs of infection of the foot although the leg on the right does seem to show some signs of erythema today I do think we need to address that currently. This looks to be cellulitis. 03-31-2022 upon evaluation today patient appears to be doing better in regard to the cellulitis  I am pleased in that regard he is still taking the antibiotics. Fortunately I do not see any evidence of active infection locally or systemically at this time which is great news. No fevers, chills, nausea, vomiting, or diarrhea. 04-10-2022 upon evaluation today patient appears to be doing well currently in regard to his foot ulcer I think were very close to complete resolution which is great news. Fortunately I do not see any signs of active infection locally or systemically at this time. No fevers, chills, nausea, vomiting, or diarrhea. 04-24-2022 upon evaluation today patient appears to be doing about the same in regard to his foot ulcer. Were not really seeing much in the way of improvements here. With that being said I do believe that he is tolerating the dressing changes without complication. No fevers, chills, nausea, vomiting, or diarrhea. With that being said the wound on his right plantar foot just does not seem to be making progress here. I do believe that he may benefit from vascular intervention which we previously discussed but he is previously wanted to hold off on. However at this point I think that this is becoming more crucial and he did ask me about getting in touch with the vascular doctor in order to see if they can get him scheduled sooner his next follow-up until like the end of December. I am more than happy to do this as I think it would be definitely beneficial for him. 05-01-2022 upon evaluation patient's wound bed actually showed signs of good granulation and epithelization at this point all things considered. Still his ABIs are very low is about 50% on each leg. With that being said he does have a follow-up with vascular in about 2 weeks maybe 2-1/2 weeks where he is going to have the angiogram for the right leg and then subsequently the left leg will be to follow. Nonetheless I think this is can make a big difference in getting the wounds healed to be honest. Fortunately I  do not see any signs of active infection at this point. 11/9; patient arrives in clinic with 2 new superficial wounds on the right leg and right ankle. Both of these and the same condition of the original wound. In terms of the wound on his plantar foot about the same. We have been using collagen. His arterial review/angiogram is due for November 21 05-15-2022 upon evaluation today patient appears to be doing well currently in regard to his wounds all things considered. He actually has an appointment upcoming with the vascular surgeon where he is going to be undergoing intervention. Obviously I think this could be beneficial as far as helping this right leg and foot to heal it also allows to be able to put compression on which I think is much needed. He voiced understanding. That is on 21 November.. 05-29-2022 upon evaluation today patient appears to be doing significantly better in regard to his leg in general he seems to have much better blood flow compared to previous. Fortunately there does not appear to be any signs  of infection locally nor systemically at this time. The good news is post his intervention procedure with Dr. Delana Meyer he only has 10% residual blockage in the right leg and to be honest this already looks much better from a color perspective I am very pleased with where we stand. 06-05-2022 upon evaluation today patient appears to be doing well currently in regard to his wounds. He has been tolerating the dressing changes the legs all appear to be healed. In regard to the foot this is measuring much smaller and doing much better and overall I am extremely pleased with where we stand currently. I do not see any signs of active infection locally or systemically at this time. 06-12-2022 upon evaluation today patient appears to be doing well in regard to his right foot ulcer which is almost completely healed I am actually very pleased with where things stand. I do not see any signs of  active infection at this time. 06-19-2022 upon evaluation patient actually appears to be making excellent progress at this point. Fortunately I do not see any signs of infection locally nor systemically which is great news and overall I am extremely pleased with where we are currently. In general I think that the patient is very close to complete resolution in regard to his foot. Objective Constitutional Well-nourished and well-hydrated in no acute distress. Vitals Time Taken: 8:01 AM, Height: 74 in, Weight: 244 lbs, BMI: 31.3, Temperature: 97.5 F, Pulse: 52 bpm, Respiratory Rate: 16 breaths/min, Blood Pressure: 101/45 mmHg. Respiratory normal breathing without difficulty. [name], [name] ([id number]) [id number] Page 8 of 9 Psychiatric this patient is able to make decisions and demonstrates good insight into disease process. Alert and Oriented x 3. pleasant and cooperative. General Notes: Upon inspection patient's wound bed did require sharp debridement to clearway some of the necrotic debris he tolerated this today without complication and postdebridement the wound bed is significantly improved. Integumentary (Hair, Skin) Wound #2 status is Open. Original cause of wound was Gradually Appeared. The date acquired was: 12/28/2020. The wound has been in treatment 22 weeks. The wound is located on the [place]. The wound measures 0.4cm length x 0.1cm width x 0.2cm depth; 0.031cm^2 area and 0.006cm^3 volume. There is Fat Layer (Subcutaneous Tissue) exposed. There is a medium amount of serosanguineous drainage noted. There is small (1-33%) red granulation within the wound bed. There is no necrotic tissue within the wound bed. Assessment Active Problems ICD-10 Chronic venous hypertension (idiopathic) with ulcer and inflammation of bilateral lower extremity Non-pressure chronic ulcer of other part of right lower leg with fat layer exposed Non-pressure chronic  ulcer of other part of left lower leg with fat layer exposed Other idiopathic peripheral autonomic neuropathy Non-pressure chronic ulcer of other part of left foot with fat layer exposed Non-pressure chronic ulcer of other part of right foot with fat layer exposed Essential (primary) hypertension Chronic combined systolic (congestive) and diastolic (congestive) heart failure Paroxysmal atrial fibrillation Dilated cardiomyopathy Long term (current) use of anticoagulants Atherosclerotic heart disease of native coronary artery without angina pectoris Chronic kidney disease, stage 3b Procedures Wound #2 Pre-procedure diagnosis of Wound #2 is a Neuropathic Ulcer-Non Diabetic located on the [place] . There was a Selective/Open Wound Non-Viable Tissue Debridement with a total area of 0.04 sq cm performed by Tommie Sams., PA-C. With the following instrument(s): Curette to remove Non-Viable tissue/material. Material removed includes Callus. No specimens were taken. A time out was conducted at 08:16, prior to the start of the  procedure. There was no bleeding. The procedure was tolerated well. Post Debridement Measurements: 0.4cm length x 0.1cm width x 0.3cm depth; 0.009cm^3 volume. Character of Wound/Ulcer Post Debridement is stable. Post procedure Diagnosis Wound #2: Same as Pre-Procedure Plan Follow-up Appointments: Return Appointment in 1 week. Nurse Visit as needed Bathing/ Shower/ Hygiene: May shower with wound dressing protected with water repellent cover or cast protector. Edema Control - Lymphedema / Segmental Compressive Device / Other: Tubigrip double layer applied - tubi E double layer Elevate, Exercise Daily and Avoid Standing for Long Periods of Time. Elevate legs to the level of the heart and pump ankles as often as possible Elevate leg(s) parallel to the floor when sitting. Off-Loading: Open toe surgical shoe with peg assist. WOUND #2: - Foot Wound Laterality:  Plantar, Right Cleanser: Wound Cleanser 3 x Per Week/30 Days Discharge Instructions: Wash your hands with soap and water. Remove old dressing, discard into plastic bag and place into trash. Cleanse the wound with Wound Cleanser prior to applying a clean dressing using gauze sponges, not tissues or cotton balls. Do not scrub or use excessive force. Pat dry using gauze sponges, not tissue or cotton balls. Prim Dressing: Silvercel Small 2x2 (in/in) 3 x Per Week/30 Days ary Discharge Instructions: Apply Silvercel Small 2x2 (in/in) as instructed Com pression Wrap: 3-LAYER WRAP - Profore Lite LF 3 Multilayer Compression Bandaging System 3 x Per Week/30 Days Discharge Instructions: Apply 3 multi-layer wrap as prescribed. 1. I am going to recommend that we have the patient continue with the silver alginate dressing which I think is still good to be the best way to go. 2. He will also continue with the peg assist offloading shoe. 3. We are also going to continue with the 3 layer compression wrap which I think is doing a great job keeping his edema under control. We will see patient back for reevaluation in 1 week here in the clinic. If anything worsens or changes patient will contact our office for additional recommendations. [name], [name] ([id number]) [id number] Page 9 of 9 Electronic Signature(s) Signed: 06/19/2022 1:50:34 PM By: Worthy Keeler PA-C Entered By: Worthy Keeler on 06/19/2022 13:50:34 -------------------------------------------------------------------------------- SuperBill Details Patient Name: Date of Service: [name] [facility] [name]. 06/19/2022 Medical Record Number: [id number] Patient Account Number: [id number] Date of Birth/Sex: Treating RN: [birth year] (81 y.o. [name] Primary Care Provider: [name] Other Clinician: Referring Provider: Treating Provider/Extender: [name] in Treatment: 22 Diagnosis  Coding ICD-10 Codes Code Description [phone] Chronic venous hypertension (idiopathic) with ulcer and inflammation of bilateral lower extremity L97.812 Non-pressure chronic ulcer of other part of right lower leg with fat layer exposed L97.822 Non-pressure chronic ulcer of other part of left lower leg with fat layer exposed G90.09 Other idiopathic peripheral autonomic neuropathy L97.522 Non-pressure chronic ulcer of other part of left foot with fat layer exposed L97.512 Non-pressure chronic ulcer of other part of right foot with fat layer exposed I10 Essential (primary) hypertension I50.42 Chronic combined systolic (congestive) and diastolic (congestive) heart failure I48.0 Paroxysmal atrial fibrillation I42.0 Dilated cardiomyopathy Z79.01 Long term (current) use of anticoagulants I25.10 Atherosclerotic heart disease of native coronary artery without angina pectoris N18.32 Chronic kidney disease, stage 3b Facility Procedures : CPT4 Code: [id number] Description: 94709 - DEBRIDE WOUND 1ST 20 SQ CM OR < ICD-10 Diagnosis Description L97.512 Non-pressure chronic ulcer of other part of right foot with fat layer exposed Modifier: Quantity: 1 Physician Procedures : CPT4 Code Description Modifier [id number] - WC  PHYS DEBR WO ANESTH 20 SQ CM ICD-10 Diagnosis Description L97.512 Non-pressure chronic ulcer of other part of right foot with fat layer exposed Quantity: 1 Electronic Signature(s) Signed: 06/19/2022 1:50:50 PM By: Worthy Keeler PA-C Entered By: Worthy Keeler on 06/19/2022 13:50:49

## 2022-06-20 ENCOUNTER — Other Ambulatory Visit: Payer: Self-pay | Admitting: Family Medicine

## 2022-06-20 DIAGNOSIS — E782 Mixed hyperlipidemia: Secondary | ICD-10-CM

## 2022-06-20 DIAGNOSIS — E039 Hypothyroidism, unspecified: Secondary | ICD-10-CM

## 2022-06-26 ENCOUNTER — Encounter: Payer: PPO | Admitting: Internal Medicine

## 2022-06-26 DIAGNOSIS — I87333 Chronic venous hypertension (idiopathic) with ulcer and inflammation of bilateral lower extremity: Secondary | ICD-10-CM | POA: Diagnosis not present

## 2022-06-26 DIAGNOSIS — G6289 Other specified polyneuropathies: Secondary | ICD-10-CM | POA: Diagnosis not present

## 2022-06-26 DIAGNOSIS — L97512 Non-pressure chronic ulcer of other part of right foot with fat layer exposed: Secondary | ICD-10-CM | POA: Diagnosis not present

## 2022-06-26 NOTE — Progress Notes (Signed)
[name], [name] ([id number]) [id number] Page 1 of 9 Visit Report for 06/26/2022 Debridement Details Patient Name: Date of Service: [name] [facility] D. 06/26/2022 7:45 A M Medical Record Number: [id number] Patient Account Number: [id number] Date of Birth/Sex: Treating RN: 1940/12/27 (81 y.o. [name] Primary Care Provider: [name] Other Clinician: Referring Provider: Treating Provider/Extender: RO BSO Delane Ginger, MICHA EL Odessa Fleming in Treatment: 23 Debridement Performed for Assessment: Wound #2 [place] Performed By: Physician Ricard Dillon, MD Debridement Type: Debridement Level of Consciousness (Pre-procedure): Awake and Alert Pre-procedure Verification/Time Out No Taken: T Area Debrided (L x W): otal 0.3 (cm) x 0.1 (cm) = 0.03 (cm) Tissue and other material debrided: Viable, Non-Viable, Callus Level: Non-Viable Tissue Debridement Description: Selective/Open Wound Instrument: Curette Bleeding: Moderate Hemostasis Achieved: Silver Nitrate Response to Treatment: Procedure was tolerated well Level of Consciousness (Post- Awake and Alert procedure): Post Debridement Measurements of Total Wound Length: (cm) 0.3 Width: (cm) 0.1 Depth: (cm) 0.2 Volume: (cm) 0.005 Character of Wound/Ulcer Post Debridement: Stable Post Procedure Diagnosis Same as Pre-procedure Electronic Signature(s) Signed: 06/26/2022 4:17:44 PM By: Linton Ham MD Signed: 06/26/2022 4:56:13 PM By: Rosalio Loud MSN RN CNS WTA Entered By: Rosalio Loud on 06/26/2022 08:20:00 -------------------------------------------------------------------------------- HPI Details Patient Name: Date of Service: [name], RA LPH D. 06/26/2022 7:45 A M Medical Record Number: [id number] Patient Account Number: [id number] Date of Birth/Sex: Treating RN: 1941-05-06 (81 y.o. [name] Primary Care Provider: [name] Other Clinician: MARKUS, CASTEN D  ([id number]) [id number] Page 2 of 9 Referring Provider: Treating Provider/Extender: RO BSO N, MICHA EL Isaac Bliss Weeks in Treatment: 23 History of Present Illness HPI Description: 01-16-2022 upon evaluation today patient presents for initial inspection here in the clinic concerning issues that he has been having with wounds over the bilateral lower extremities and bilateral feet. Fortunately there does not appear to be any signs of significant infection at this point which is great news. Unfortunately his ABIs were registering at 0.49 on the left and 0.57 on the right here in the clinic on the screening today. With that being said I do think that it may possibly be true that we need to get him to formal arterial studies in order to see how things really are showing up. And the patient's not in disagreement with this for that reason we are going to make that referral as well. With that being said he does have some significant past medical history items of note detailed below. Patient does have chronic kidney disease stage IIIb, coronary artery disease, long-term use of anticoagulant therapy, dilated cardiomyopathy. Proximal atrial fibrillation, he is on Xarelto long-term. He also has congestive heart failure, hypertension, peripheral neuropathy not related to diabetes as he is only prediabetic with a hemoglobin A1c of 5.8, and chronic venous hypertension. He has recently seen Dr. [name] show his cardiologist who did discontinue the hydrochlorothiazide and the Lasix and felt that the patient was retaining more fluid than he should be. For that reason he was started on furosemide 20 mg twice daily. 7/27; patient with bilateral lower extremity wounds. These are largely venous although he had very poor ABIs on screening test here. He has an appointment apparently tomorrow at vein and vascular for arterial studies. He was not too keen on the latter although I think I  emphasized the need to do this both in terms of dealing with the current wounds and being prepared for further issues down the road. We have been using  silver alginate on the wounds and Tubigrip's edema control is better per our intake nurse 02-06-2022 upon evaluation today patient appears to be doing well with regard to his wound. Fortunately there is no signs of infection he is actually healing for the most part on the bilateral feet. His legs are also doing much better though he has 1 new area that popped up on the right leg. Fortunately there is no evidence of active infection of note his ABIs and TBI's were poor on the vascular screening on the 31st with the left being worse than the right. For that reason I am going to recommend he still continue to follow-up with vascular to discuss angioplasty/stenting. [id number] upon evaluation today patient appears to be doing well currently in regard to his wound on the leg although he has several other blisters that are showing up at this point. I do believe that we need to try to do something to get his swelling under control this could be of utmost importance as far as getting this healed is concerned. 03-06-2022 upon evaluation today patient appears to be doing well currently in regard to his wounds. In fact the legs are drying up and seem to be healing quite nicely the lateral wounds are still weeping a little bit about the medial side actually appears to be pretty much dry and closed. The plantar aspect of his foot still is draining a bit but again it seems to be doing much better which is great news.[name] 03-13-2022 upon evaluation patient appears to be almost completely healed. His leg is doing excellent and his foot is just about closed. With that being said he did see vascular and they actually discussed the possibility of doing an arteriogram/angiogram in order to see if anything needed to be the balloons were stented open. With that being said he decided  not to do that since things are healing so well right now and he is going to consider that for future they plan to see him back in November. 03-21-2022 upon evaluation today patient appears to be doing a little worse in regard to his wound on the foot. The wound keeps seeming to pull together unfortunately which is causing a trapped some fluid is just not healing quite as well as would like to see. With that being said I am going to try to remove a bit more of the overgrowth callus tissue today and hopefully that can help get this to seal up much more effectively and quickly. Fortunately I see no signs of infection of the foot although the leg on the right does seem to show some signs of erythema today I do think we need to address that currently. This looks to be cellulitis. 03-31-2022 upon evaluation today patient appears to be doing better in regard to the cellulitis I am pleased in that regard he is still taking the antibiotics. Fortunately I do not see any evidence of active infection locally or systemically at this time which is great news. No fevers, chills, nausea, vomiting, or diarrhea. 04-10-2022 upon evaluation today patient appears to be doing well currently in regard to his foot ulcer I think were very close to complete resolution which is great news. Fortunately I do not see any signs of active infection locally or systemically at this time. No fevers, chills, nausea, vomiting, or diarrhea. 04-24-2022 upon evaluation today patient appears to be doing about the same in regard to his foot ulcer. Were not really seeing much in the way  of improvements here. With that being said I do believe that he is tolerating the dressing changes without complication. No fevers, chills, nausea, vomiting, or diarrhea. With that being said the wound on his right plantar foot just does not seem to be making progress here. I do believe that he may benefit from vascular intervention which we previously  discussed but he is previously wanted to hold off on. However at this point I think that this is becoming more crucial and he did ask me about getting in touch with the vascular doctor in order to see if they can get him scheduled sooner his next follow-up until like the end of December. I am more than happy to do this as I think it would be definitely beneficial for him. 05-01-2022 upon evaluation patient's wound bed actually showed signs of good granulation and epithelization at this point all things considered. Still his ABIs are very low is about 50% on each leg. With that being said he does have a follow-up with vascular in about 2 weeks maybe 2-1/2 weeks where he is going to have the angiogram for the right leg and then subsequently the left leg will be to follow. Nonetheless I think this is can make a big difference in getting the wounds healed to be honest. Fortunately I do not see any signs of active infection at this point. 11/9; patient arrives in clinic with 2 new superficial wounds on the right leg and right ankle. Both of these and the same condition of the original wound. In terms of the wound on his plantar foot about the same. We have been using collagen. His arterial review/angiogram is due for November 21 05-15-2022 upon evaluation today patient appears to be doing well currently in regard to his wounds all things considered. He actually has an appointment upcoming with the vascular surgeon where he is going to be undergoing intervention. Obviously I think this could be beneficial as far as helping this right leg and foot to heal it also allows to be able to put compression on which I think is much needed. He voiced understanding. That is on 21 November.. 05-29-2022 upon evaluation today patient appears to be doing significantly better in regard to his leg in general he seems to have much better blood flow compared to previous. Fortunately there does not appear to be any signs of  infection locally nor systemically at this time. The good news is post his intervention procedure with Dr. Delana Meyer he only has 10% residual blockage in the right leg and to be honest this already looks much better from a color perspective I am very pleased with where we stand. 06-05-2022 upon evaluation today patient appears to be doing well currently in regard to his wounds. He has been tolerating the dressing changes the legs all appear to be healed. In regard to the foot this is measuring much smaller and doing much better and overall I am extremely pleased with where we stand currently. I do not see any signs of active infection locally or systemically at this time. 06-12-2022 upon evaluation today patient appears to be doing well in regard to his right foot ulcer which is almost completely healed I am actually very pleased with where things stand. I do not see any signs of active infection at this time. JOREL, GRAVLIN ([id number]) [id number] Page 3 of 9 06-19-2022 upon evaluation patient actually appears to be making excellent progress at this point. Fortunately I do not see any signs  of infection locally nor systemically which is great news and overall I am extremely pleased with where we are currently. In general I think that the patient is very close to complete resolution in regard to his foot. 12/28; patient comes in with a small wound on his right plantar foot a nondiabetic neuropathic wound also in the setting with lower extremity chronic venous insufficiency and apparently he has had interventions for PAD. He was hoping that the wound would be closed this week. Has been using Prisma and a peg assist shoe Electronic Signature(s) Signed: 06/26/2022 4:17:44 PM By: Linton Ham MD Entered By: Linton Ham on 06/26/2022 08:27:09 -------------------------------------------------------------------------------- Physical Exam Details Patient Name: Date of  Service: [name] [facility] D. 06/26/2022 7:45 A M Medical Record Number: [id number] Patient Account Number: [id number] Date of Birth/Sex: Treating RN: 1941-05-08 (81 y.o. [name] Primary Care Provider: [name] Other Clinician: Referring Provider: Treating Provider/Extender: RO BSO Delane Ginger, MICHA EL Odessa Fleming in Treatment: 23 Constitutional Patient is hypotensive.However he appears well. Pulse regular and within target range for patient.[name] Respirations regular, non-labored and within target range.. Temperature is normal and within the target range for the patient.[name] appears in no distress. Notes Wound exam; plantar right foot very tiny open area the base of this had about 90% of the area epithelialized but a very tiny nonepithelialized hole. He also had thick skin around the wound. I used #5 curette to remove some of this nicking him in the process hemostasis with silver nitrate. There is no evidence of surrounding infection Electronic Signature(s) Signed: 06/26/2022 4:17:44 PM By: Linton Ham MD Entered By: Linton Ham on 06/26/2022 08:28:17 -------------------------------------------------------------------------------- Physician Orders Details Patient Name: Date of Service: [name] [facility] D. 06/26/2022 7:45 A M Medical Record Number: [id number] Patient Account Number: [id number] Date of Birth/Sex: Treating RN: Feb 28, 1941 (81 y.o. [name] Primary Care Provider: [name] Other Clinician: Referring Provider: Treating Provider/Extender: RO BSO Delane Ginger, MICHA EL Odessa Fleming in Treatment: 23 Verbal / Phone Orders: No Diagnosis Coding AMILIO, ZEHNDER ([id number]) [id number] Page 4 of 9 Follow-up Appointments Return Appointment in 1 week. Nurse Visit as needed Bathing/ Shower/ Hygiene May shower with wound dressing protected with water repellent cover or cast protector. Edema Control - Lymphedema / Segmental  Compressive Device / Other Tubigrip double layer applied - tubi E double layer Elevate, Exercise Daily and A void Standing for Long Periods of Time. Elevate legs to the level of the heart and pump ankles as often as possible Elevate leg(s) parallel to the floor when sitting. Off-Loading Right Lower Extremity Open toe surgical shoe with peg assist. Wound Treatment Wound #2 - Foot Wound Laterality: Plantar, Right Cleanser: Wound Cleanser 3 x Per Week/30 Days Discharge Instructions: Wash your hands with soap and water. Remove old dressing, discard into plastic bag and place into trash. Cleanse the wound with Wound Cleanser prior to applying a clean dressing using gauze sponges, not tissues or cotton balls. Do not scrub or use excessive force. Pat dry using gauze sponges, not tissue or cotton balls. Prim Dressing: Silvercel Small 2x2 (in/in) 3 x Per Week/30 Days ary Discharge Instructions: Apply Silvercel Small 2x2 (in/in) as instructed Secured With: Tubigrip Size E, 3.5x10 (in/yds) 3 x Per Week/30 Days Discharge Instructions: Apply 3 Tubigrip E 3-finger-widths below knee to base of toes to secure dressing and/or for swelling. Electronic Signature(s) Signed: 06/26/2022 4:17:44 PM By: Linton Ham MD Signed: 06/26/2022 4:56:13 PM By: Rosalio Loud MSN RN  CNS WTA Entered By: Rosalio Loud on 06/26/2022 08:31:37 -------------------------------------------------------------------------------- Problem List Details Patient Name: Date of Service: [name] [facility] D. 06/26/2022 7:45 A M Medical Record Number: [id number] Patient Account Number: [id number] Date of Birth/Sex: Treating RN: [birth year] (81 y.o. [name] Primary Care Provider: [name] Other Clinician: Referring Provider: Treating Provider/Extender: [name], MICHA EL Odessa Fleming in Treatment: 23 Active Problems ICD-10 Encounter Code Description Active Date MDM Diagnosis I87.333 Chronic venous hypertension  (idiopathic) with ulcer and inflammation of 01/16/2022 No Yes bilateral lower extremity L97.812 Non-pressure chronic ulcer of other part of right lower leg with fat layer 01/16/2022 No Yes exposed L97.822 Non-pressure chronic ulcer of other part of left lower leg with fat layer exposed 01/16/2022 No Yes Badeaux, Stanislav D ([id number]) [id number] Page 5 of 9 G90.09 Other idiopathic peripheral autonomic neuropathy 01/16/2022 No Yes L97.522 Non-pressure chronic ulcer of other part of left foot with fat layer exposed 01/16/2022 No Yes L97.512 Non-pressure chronic ulcer of other part of right foot with fat layer exposed 01/16/2022 No Yes I10 Essential (primary) hypertension 01/16/2022 No Yes I50.42 Chronic combined systolic (congestive) and diastolic (congestive) heart failure 01/16/2022 No Yes I48.0 Paroxysmal atrial fibrillation 01/16/2022 No Yes I42.0 Dilated cardiomyopathy 01/16/2022 No Yes Z79.01 Long term (current) use of anticoagulants 01/16/2022 No Yes I25.10 Atherosclerotic heart disease of native coronary artery without angina pectoris 01/16/2022 No Yes N18.32 Chronic kidney disease, stage 3b 01/16/2022 No Yes Inactive Problems Resolved Problems Electronic Signature(s) Signed: 06/26/2022 4:17:44 PM By: Linton Ham MD Entered By: Linton Ham on 06/26/2022 08:26:04 -------------------------------------------------------------------------------- Progress Note Details Patient Name: Date of Service: [name], RA LPH D. 06/26/2022 7:45 A M Medical Record Number: [id number] Patient Account Number: [id number] Date of Birth/Sex: Treating RN: 01-18-1941 (81 y.o. [name] Primary Care Provider: [name] Other Clinician: Referring Provider: Treating Provider/Extender: [name], [place] EL Odessa Fleming in Treatment: [address] DOMINICO, ROD D ([id number]) [id number] Page 6 of 9 History of Present Illness  (HPI) 01-16-2022 upon evaluation today patient presents for initial inspection here in the clinic concerning issues that he has been having with wounds over the bilateral lower extremities and bilateral feet. Fortunately there does not appear to be any signs of significant infection at this point which is great news. Unfortunately his ABIs were registering at 0.49 on the left and 0.57 on the right here in the clinic on the screening today. With that being said I do think that it may possibly be true that we need to get him to formal arterial studies in order to see how things really are showing up. And the patient's not in disagreement with this for that reason we are going to make that referral as well. With that being said he does have some significant past medical history items of note detailed below. Patient does have chronic kidney disease stage IIIb, coronary artery disease, long-term use of anticoagulant therapy, dilated cardiomyopathy. Proximal atrial fibrillation, he is on Xarelto long-term. He also has congestive heart failure, hypertension, peripheral neuropathy not related to diabetes as he is only prediabetic with a hemoglobin A1c of 5.8, and chronic venous hypertension. He has recently seen Dr. [name] show his cardiologist who did discontinue the hydrochlorothiazide and the Lasix and felt that the patient was retaining more fluid than he should be. For that reason he was started on furosemide 20 mg twice daily. 7/27; patient with bilateral lower extremity wounds. These are largely venous although he had very  poor ABIs on screening test here. He has an appointment apparently tomorrow at vein and vascular for arterial studies. He was not too keen on the latter although I think I emphasized the need to do this both in terms of dealing with the current wounds and being prepared for further issues down the road. We have been using silver alginate on the wounds and Tubigrip's edema control is  better per our intake nurse 02-06-2022 upon evaluation today patient appears to be doing well with regard to his wound. Fortunately there is no signs of infection he is actually healing for the most part on the bilateral feet. His legs are also doing much better though he has 1 new area that popped up on the right leg. Fortunately there is no evidence of active infection of note his ABIs and TBI's were poor on the vascular screening on the 31st with the left being worse than the right. For that reason I am going to recommend he still continue to follow-up with vascular to discuss angioplasty/stenting. 8-124-23 upon evaluation today patient appears to be doing well currently in regard to his wound on the leg although he has several other blisters that are showing up at this point. I do believe that we need to try to do something to get his swelling under control this could be of utmost importance as far as getting this healed is concerned. 03-06-2022 upon evaluation today patient appears to be doing well currently in regard to his wounds. In fact the legs are drying up and seem to be healing quite nicely the lateral wounds are still weeping a little bit about the medial side actually appears to be pretty much dry and closed. The plantar aspect of his foot still is draining a bit but again it seems to be doing much better which is great news.[name] 03-13-2022 upon evaluation patient appears to be almost completely healed. His leg is doing excellent and his foot is just about closed. With that being said he did see vascular and they actually discussed the possibility of doing an arteriogram/angiogram in order to see if anything needed to be the balloons were stented open. With that being said he decided not to do that since things are healing so well right now and he is going to consider that for future they plan to see him back in November. 03-21-2022 upon evaluation today patient appears to be doing a little  worse in regard to his wound on the foot. The wound keeps seeming to pull together unfortunately which is causing a trapped some fluid is just not healing quite as well as would like to see. With that being said I am going to try to remove a bit more of the overgrowth callus tissue today and hopefully that can help get this to seal up much more effectively and quickly. Fortunately I see no signs of infection of the foot although the leg on the right does seem to show some signs of erythema today I do think we need to address that currently. This looks to be cellulitis. 03-31-2022 upon evaluation today patient appears to be doing better in regard to the cellulitis I am pleased in that regard he is still taking the antibiotics. Fortunately I do not see any evidence of active infection locally or systemically at this time which is great news. No fevers, chills, nausea, vomiting, or diarrhea. 04-10-2022 upon evaluation today patient appears to be doing well currently in regard to his foot ulcer I  think were very close to complete resolution which is great news. Fortunately I do not see any signs of active infection locally or systemically at this time. No fevers, chills, nausea, vomiting, or diarrhea. 04-24-2022 upon evaluation today patient appears to be doing about the same in regard to his foot ulcer. Were not really seeing much in the way of improvements here. With that being said I do believe that he is tolerating the dressing changes without complication. No fevers, chills, nausea, vomiting, or diarrhea. With that being said the wound on his right plantar foot just does not seem to be making progress here. I do believe that he may benefit from vascular intervention which we previously discussed but he is previously wanted to hold off on. However at this point I think that this is becoming more crucial and he did ask me about getting in touch with the vascular doctor in order to see if they can get  him scheduled sooner his next follow-up until like the end of December. I am more than happy to do this as I think it would be definitely beneficial for him. 05-01-2022 upon evaluation patient's wound bed actually showed signs of good granulation and epithelization at this point all things considered. Still his ABIs are very low is about 50% on each leg. With that being said he does have a follow-up with vascular in about 2 weeks maybe 2-1/2 weeks where he is going to have the angiogram for the right leg and then subsequently the left leg will be to follow. Nonetheless I think this is can make a big difference in getting the wounds healed to be honest. Fortunately I do not see any signs of active infection at this point. 11/9; patient arrives in clinic with 2 new superficial wounds on the right leg and right ankle. Both of these and the same condition of the original wound. In terms of the wound on his plantar foot about the same. We have been using collagen. His arterial review/angiogram is due for November 21 05-15-2022 upon evaluation today patient appears to be doing well currently in regard to his wounds all things considered. He actually has an appointment upcoming with the vascular surgeon where he is going to be undergoing intervention. Obviously I think this could be beneficial as far as helping this right leg and foot to heal it also allows to be able to put compression on which I think is much needed. He voiced understanding. That is on 21 November.. 05-29-2022 upon evaluation today patient appears to be doing significantly better in regard to his leg in general he seems to have much better blood flow compared to previous. Fortunately there does not appear to be any signs of infection locally nor systemically at this time. The good news is post his intervention procedure with Dr. Delana Meyer he only has 10% residual blockage in the right leg and to be honest this already looks much better from a  color perspective I am very pleased with where we stand. 06-05-2022 upon evaluation today patient appears to be doing well currently in regard to his wounds. He has been tolerating the dressing changes the legs all appear to be healed. In regard to the foot this is measuring much smaller and doing much better and overall I am extremely pleased with where we stand currently. I do not see any signs of active infection locally or systemically at this time. 06-12-2022 upon evaluation today patient appears to be doing well in regard to  his right foot ulcer which is almost completely healed I am actually very pleased with where things stand. I do not see any signs of active infection at this time. 06-19-2022 upon evaluation patient actually appears to be making excellent progress at this point. Fortunately I do not see any signs of infection locally nor systemically which is great news and overall I am extremely pleased with where we are currently. In general I think that the patient is very close to complete resolution in regard to his foot. 12/28; patient comes in with a small wound on his right plantar foot a nondiabetic neuropathic wound also in the setting with lower extremity chronic venous Siwik, Krew D ([id number]) [id number] Page 7 of 9 insufficiency and apparently he has had interventions for PAD. He was hoping that the wound would be closed this week. Has been using Prisma and a peg assist shoe Objective Constitutional Patient is hypotensive.However he appears well. Pulse regular and within target range for patient.[name] Respirations regular, non-labored and within target range.. Temperature is normal and within the target range for the patient.[name] appears in no distress. Vitals Time Taken: 7:56 AM, Height: 74 in, Weight: 244 lbs, BMI: 31.3, Temperature: 97.8 F, Pulse: 67 bpm, Respiratory Rate: 16 breaths/min, Blood Pressure: 95/56 mmHg. General Notes: Wound exam;  plantar right foot very tiny open area the base of this had about 90% of the area epithelialized but a very tiny nonepithelialized hole. He also had thick skin around the wound. I used #5 curette to remove some of this nicking him in the process hemostasis with silver nitrate. There is no evidence of surrounding infection Integumentary (Hair, Skin) Wound #2 status is Open. Original cause of wound was Gradually Appeared. The date acquired was: 12/28/2020. The wound has been in treatment 23 weeks. The wound is located on the [place]. The wound measures 0.3cm length x 0.1cm width x 0.1cm depth; 0.024cm^2 area and 0.002cm^3 volume. There is Fat Layer (Subcutaneous Tissue) exposed. There is a medium amount of serosanguineous drainage noted. There is small (1-33%) red granulation within the wound bed. There is no necrotic tissue within the wound bed. Assessment Active Problems ICD-10 Chronic venous hypertension (idiopathic) with ulcer and inflammation of bilateral lower extremity Non-pressure chronic ulcer of other part of right lower leg with fat layer exposed Non-pressure chronic ulcer of other part of left lower leg with fat layer exposed Other idiopathic peripheral autonomic neuropathy Non-pressure chronic ulcer of other part of left foot with fat layer exposed Non-pressure chronic ulcer of other part of right foot with fat layer exposed Essential (primary) hypertension Chronic combined systolic (congestive) and diastolic (congestive) heart failure Paroxysmal atrial fibrillation Dilated cardiomyopathy Long term (current) use of anticoagulants Atherosclerotic heart disease of native coronary artery without angina pectoris Chronic kidney disease, stage 3b Procedures Wound #2 Pre-procedure diagnosis of Wound #2 is a Neuropathic Ulcer-Non Diabetic located on the [place] . There was a Selective/Open Wound Non-Viable Tissue Debridement with a total area of 0.03 sq cm performed  by Ricard Dillon, MD. With the following instrument(s): Curette to remove Viable and Non-Viable tissue/material. Material removed includes Callus. No specimens were taken.A Moderate amount of bleeding was controlled with Silver Nitrate. The procedure was tolerated well. Post Debridement Measurements: 0.3cm length x 0.1cm width x 0.2cm depth; 0.005cm^3 volume. Character of Wound/Ulcer Post Debridement is stable. Post procedure Diagnosis Wound #2: Same as Pre-Procedure Plan Follow-up Appointments: Return Appointment in 1 week. Nurse Visit as needed Bathing/ Shower/ Hygiene: May shower  with wound dressing protected with water repellent cover or cast protector. Edema Control - Lymphedema / Segmental Compressive Device / Other: Tubigrip double layer applied - tubi E double layer Elevate, Exercise Daily and Avoid Standing for Long Periods of Time. Elevate legs to the level of the heart and pump ankles as often as possible Elevate leg(s) parallel to the floor when sitting. Off-LoadingZACKARY, MCKEONE ([id number]) [id number] Page 8 of 9 Open toe surgical shoe with peg assist. WOUND #2: - Foot Wound Laterality: Plantar, Right Cleanser: Wound Cleanser 3 x Per Week/30 Days Discharge Instructions: Wash your hands with soap and water. Remove old dressing, discard into plastic bag and place into trash. Cleanse the wound with Wound Cleanser prior to applying a clean dressing using gauze sponges, not tissues or cotton balls. Do not scrub or use excessive force. Pat dry using gauze sponges, not tissue or cotton balls. Prim Dressing: Silvercel Small 2x2 (in/in) 3 x Per Week/30 Days ary Discharge Instructions: Apply Silvercel Small 2x2 (in/in) as instructed Com pression Wrap: 3-LAYER WRAP - Profore Lite LF 3 Multilayer Compression Bandaging System 3 x Per Week/30 Days Discharge Instructions: Apply 3 multi-layer wrap as prescribed. 1. Right plantar foot. We continued with  the same dressing which actually is silver cell. He had not been changing this as he is in 3 layer compression on the right. 2. He did not want to 3 layer compression, he wants to use Tubigrip. He claims that he can put a dressing on this. Somewhat reluctantly I agreed to have him change his primary dressing himself which is silver alginate and foam and used Tubigrip. 3. Even if there had not been a small area open he would have needed to continue to offload this the new epithelium was simply too fragile 4. He will continue in his Peg assist shoe 5. In preparation for this area actually being healed I told him he would need insoles for his shoes, likely callus pads for at least the foreseeable future. 6. He would also likely benefit from 20/30 below-knee stockings Electronic Signature(s) Signed: 06/26/2022 4:17:44 PM By: Linton Ham MD Entered By: Linton Ham on 06/26/2022 08:31:28 -------------------------------------------------------------------------------- SuperBill Details Patient Name: Date of Service: [name], RA LPH D. 06/26/2022 Medical Record Number: [id number] Patient Account Number: [id number] Date of Birth/Sex: Treating RN: 06/02/1941 (81 y.o. [name] Primary Care Provider: [name] Other Clinician: Referring Provider: Treating Provider/Extender: [name], MICHA EL Odessa Fleming in Treatment: 23 Diagnosis Coding ICD-10 Codes Code Description [phone] Chronic venous hypertension (idiopathic) with ulcer and inflammation of bilateral lower extremity L97.812 Non-pressure chronic ulcer of other part of right lower leg with fat layer exposed L97.822 Non-pressure chronic ulcer of other part of left lower leg with fat layer exposed G90.09 Other idiopathic peripheral autonomic neuropathy L97.522 Non-pressure chronic ulcer of other part of left foot with fat layer exposed L97.512 Non-pressure chronic ulcer of other part of right foot with fat layer  exposed I10 Essential (primary) hypertension I50.42 Chronic combined systolic (congestive) and diastolic (congestive) heart failure I48.0 Paroxysmal atrial fibrillation I42.0 Dilated cardiomyopathy Z79.01 Long term (current) use of anticoagulants I25.10 Atherosclerotic heart disease of native coronary artery without angina pectoris N18.32 Chronic kidney disease, stage 3b Facility Procedures : [facility] CPT4 Code: [id number] , Lucien D ([id number] Description: [phone] - DEBRIDE WOUND 1ST 20 SQ CM OR < ICD-10 Diagnosis Description L97.512 Non-pressure chronic ulcer of other part of right foot with fat layer exposed 7) [id number] Modifier: [id number]. Quantity: 1  pdf Page 9 of 9 Physician Procedures : CPT4 Code Description Modifier [id number] - WC PHYS DEBR WO ANESTH 20 SQ CM ICD-10 Diagnosis Description L97.512 Non-pressure chronic ulcer of other part of right foot with fat layer exposed Quantity: 1 Electronic Signature(s) Signed: 06/26/2022 4:17:44 PM By: Linton Ham MD Entered By: Linton Ham on 06/26/2022 08:31:52

## 2022-06-26 NOTE — Progress Notes (Signed)
[name], [name] ([id number]) [id number] Page 1 of 10 Visit Report for 06/26/2022 Arrival Information Details Patient Name: Date of Service: [name] [facility] [name]. 06/26/2022 7:45 A M Medical Record Number: [id number] Patient Account Number: [id number] Date of Birth/Sex: Treating RN: Oct 17, 1940 (81 y.o. [name] Primary Care [name]: [name] Other Clinician: Referring [name]: Treating [name]/Extender: [name], [name]: 23 Visit Information History Since Last Visit Added or deleted any medications: No Patient Arrived: [name] Any new allergies or adverse reactions: No Arrival Time: 07:52 Had a fall or experienced change in No Accompanied By: self activities of daily living that may affect Transfer Assistance: None risk of falls: Patient Identification Verified: Yes Hospitalized since last visit: No Secondary Verification Process Completed: Yes Has Dressing in Place as Prescribed: Yes Patient Requires Transmission-Based Precautions: No Has Footwear/Offloading in Place as Yes Patient Has Alerts: Yes Prescribed: Patient Alerts: ABI R 0.69 L 0.66 Right: Surgical Shoe with Pressure Relief Insole TBI R 0.41 L 0.16 Pain Present Now: No Electronic Signature(s) Signed: 06/26/2022 4:56:13 PM By: [name] Loud MSN RN CNS WTA Entered By: [name] on 06/26/2022 08:17:46 -------------------------------------------------------------------------------- Clinic Level of Care Assessment Details Patient Name: Date of Service: [name] [facility] [name]. 06/26/2022 7:45 A M Medical Record Number: [id number] Patient Account Number: [id number] Date of Birth/Sex: Treating RN: 01-20-1941 (81 y.o. [name] Primary Care Banessa Mao: [name] Other Clinician: Referring Giovani Neumeister: Treating Loni Abdon/Extender: [name], [name]: 23 Clinic Level of Care Assessment Items TOOL 4 Quantity  Score []  - 0 Use when only an EandM is performed on FOLLOW-UP visit ASSESSMENTS - Nursing Assessment / Reassessment []  - 0 Reassessment of Co-morbidities (includes updates in patient status) []  - 0 Reassessment of Adherence to [name] Plan ASSESSMENTS - Wound and Skin Assessment / Reassessment [name], [name] ([id number]) [id number] Page 2 of 10 []  - 0 Simple Wound Assessment / Reassessment - one wound []  - 0 Complex Wound Assessment / Reassessment - multiple wounds []  - 0 Dermatologic / Skin Assessment (not related to wound area) ASSESSMENTS - Focused Assessment []  - 0 Circumferential Edema Measurements - multi extremities []  - 0 Nutritional Assessment / Counseling / Intervention []  - 0 Lower Extremity Assessment (monofilament, tuning fork, pulses) []  - 0 Peripheral Arterial Disease Assessment (using hand held doppler) ASSESSMENTS - Ostomy and/or Continence Assessment and Care []  - 0 Incontinence Assessment and Management []  - 0 Ostomy Care Assessment and Management (repouching, etc.) PROCESS - Coordination of Care []  - 0 Simple Patient / Family Education for ongoing care []  - 0 Complex (extensive) Patient / Family Education for ongoing care []  - 0 Staff obtains Programmer, systems, Records, T Results / Process Orders est []  - 0 Staff telephones HHA, Nursing Homes / Clarify orders / etc []  - 0 Routine Transfer to another Facility (non-emergent condition) []  - 0 Routine Hospital Admission (non-emergent condition) []  - 0 New Admissions / Biomedical engineer / Ordering NPWT Apligraf, etc. , []  - 0 Emergency Hospital Admission (emergent condition) []  - 0 Simple Discharge Coordination []  - 0 Complex (extensive) Discharge Coordination PROCESS - Special Needs []  - 0 Pediatric / Minor Patient Management []  - 0 Isolation Patient Management []  - 0 Hearing / Language / Visual special needs []  - 0 Assessment of Community assistance  (transportation, [name]/C planning, etc.) []  - 0 Additional assistance / Altered mentation []  - 0 Support Surface(s) Assessment (bed, cushion, seat, etc.) INTERVENTIONS -  Wound Cleansing / Measurement []  - 0 Simple Wound Cleansing - one wound []  - 0 Complex Wound Cleansing - multiple wounds []  - 0 Wound Imaging (photographs - any number of wounds) []  - 0 Wound Tracing (instead of photographs) []  - 0 Simple Wound Measurement - one wound []  - 0 Complex Wound Measurement - multiple wounds INTERVENTIONS - Wound Dressings []  - 0 Small Wound Dressing one or multiple wounds []  - 0 Medium Wound Dressing one or multiple wounds []  - 0 Large Wound Dressing one or multiple wounds []  - 0 Application of Medications - topical []  - 0 Application of Medications - injection INTERVENTIONS - Miscellaneous []  - 0 External ear exam []  - 0 Specimen Collection (cultures, biopsies, blood, body fluids, etc.) [name], [name] ([id number]) [id number] Page 3 of 10 []  - 0 Specimen(s) / Culture(s) sent or taken to Lab for analysis []  - 0 Patient Transfer (multiple staff / Civil Service fast streamer / Similar devices) []  - 0 Simple Staple / Suture removal (25 or less) []  - 0 Complex Staple / Suture removal (26 or more) []  - 0 Hypo / Hyperglycemic Management (close monitor of Blood Glucose) []  - 0 Ankle / Brachial Index (ABI) - do not check if billed separately []  - 0 Vital Signs Has the patient been seen at the hospital within the last three years: Yes Total Score: 0 Level Of Care: ____ Electronic Signature(s) Signed: 06/26/2022 4:56:13 PM By: [name] Loud MSN RN CNS WTA Entered By: [name] on 06/26/2022 08:20:25 -------------------------------------------------------------------------------- Encounter Discharge Information Details Patient Name: Date of Service: [name], [name]. 06/26/2022 7:45 A M Medical Record Number: [id number] Patient Account Number: [id number] Date of  Birth/Sex: Treating RN: Oct 31, 1940 (81 y.o. [name] Primary Care Nakeysha Pasqual: [name] Other Clinician: Referring Omarian Jaquith: Treating Roberta Angell/Extender: [name], [name]: 23 Encounter Discharge Information Items Post Procedure Vitals Discharge Condition: Stable Temperature (F): 97.8 Ambulatory Status: Walker Pulse (bpm): 67 Discharge Destination: Home Respiratory Rate (breaths/min): 16 Transportation: Private Auto Blood Pressure (mmHg): 75/56 Accompanied By: self Schedule Follow-up Appointment: Yes Clinical Summary of Care: Electronic Signature(s) Signed: 06/26/2022 4:56:13 PM By: [name] Loud MSN RN CNS WTA Entered By: [name] on 06/26/2022 08:32:43 -------------------------------------------------------------------------------- Lower Extremity Assessment Details Patient Name: Date of Service: [name] [facility] [name]. 06/26/2022 7:45 A M Medical Record Number: [id number] Patient Account Number: [id number] [name], [name] ([id number]) [phone].pdf Page 4 of 10 Date of Birth/Sex: Treating RN: [birth year] (81 y.o. [name] Primary Care Casee Knepp: [name] Other Clinician: Referring Aadya Kindler: Treating Kasie Leccese/Extender: [name], [name] EL [name] in [name]: 23 Edema Assessment Assessed: [Left: No] [Right: No] [Left: Edema] [Right: :] Calf Left: Right: Point of Measurement: 32 cm From Medial Instep 38.5 cm Ankle Left: Right: Point of Measurement: 12 cm From Medial Instep 27 cm Vascular Assessment Pulses: Dorsalis Pedis Palpable: [Right:Yes] Electronic Signature(s) Signed: 06/26/2022 4:56:13 PM By: [name] Loud MSN RN CNS WTA Entered By: [name] on 06/26/2022 08:18:01 -------------------------------------------------------------------------------- Multi Wound Chart Details Patient Name: Date of Service: [name], [name]. 06/26/2022 7:45 A M Medical Record Number:  [id number] Patient Account Number: [id number] Date of Birth/Sex: Treating RN: Jul 19, 1940 (81 y.o. [name] Primary Care Sherrica Niehaus: [name] Other Clinician: Referring Rahmah Mccamy: Treating Takai Chiaramonte/Extender: [name], [name]: 23 Vital Signs Height(in): 74 Pulse(bpm): 67 Weight(lbs): 244 Blood Pressure(mmHg): 95/56 Body Mass Index(BMI): 31.3 Temperature(F): 97.8 Respiratory Rate(breaths/min):  16 [2:Photos:] [[name]/A:[name]/A] Right, Plantar Foot [name]/A [name]/A Wound Location: Gradually Appeared [name]/A [name]/A Wounding Event: Neuropathic Ulcer-Non Diabetic [name]/A [name]/A Primary Etiology: Congestive Heart Failure, [name]/A [name]/A Comorbid HistoryROSHARD, [name] ([id number]) [id number] Page 5 of 10 Hypertension 12/28/2020 [name]/A [name]/A Date Acquired: 10 [name]/A [name]/A [name] of [name]: Open [name]/A [name]/A Wound Status: No [name]/A [name]/A Wound Recurrence: 0.3x0.1x0.1 [name]/A [name]/A Measurements L x W x [name] (cm) 0.024 [name]/A [name]/A A (cm) : rea 0.002 [name]/A [name]/A Volume (cm) : 69.60% [name]/A [name]/A % Reduction in Area: 91.70% [name]/A [name]/A % Reduction in Volume: Full Thickness Without Exposed [name]/A [name]/A Classification: Support Structures Medium [name]/A [name]/A Exudate Amount: Serosanguineous [name]/A [name]/A Exudate Type: red, brown [name]/A [name]/A Exudate Color: Small (1-33%) [name]/A [name]/A Granulation Amount: Red [name]/A [name]/A Granulation Quality: None Present (0%) [name]/A [name]/A Necrotic Amount: Fat Layer (Subcutaneous Tissue): Yes [name]/A [name]/A Exposed Structures: Fascia: No Tendon: No Muscle: No Joint: No Bone: No Medium (34-66%) [name]/A [name]/A Epithelialization: [name] Notes Wound #2 (Foot) Wound Laterality: Plantar, Right Cleanser Wound Cleanser Discharge Instruction: Wash your hands with soap and water. Remove old dressing, discard into plastic bag and place into trash. Cleanse the wound with Wound Cleanser prior to applying a clean dressing using gauze sponges, not tissues or cotton balls. Do not scrub or use  excessive force. Pat dry using gauze sponges, not tissue or cotton balls. Peri-Wound Care Topical Primary Dressing Silvercel Small 2x2 (in/in) Discharge Instruction: Apply Silvercel Small 2x2 (in/in) as instructed Secondary Dressing Secured With Tubigrip Size E, 3.5x10 (in/yds) Discharge Instruction: Apply 3 Tubigrip E 3-finger-widths below knee to base of toes to secure dressing and/or for swelling. Compression Wrap Compression Stockings Add-Ons Electronic Signature(s) Signed: 06/26/2022 4:56:13 PM By: [name] Loud MSN RN CNS WTA Entered By: [name] on 06/26/2022 08:18:05 -------------------------------------------------------------------------------- Multi-Disciplinary Care Plan Details Patient Name: Date of Service: [name] [facility] [name]. 06/26/2022 7:45 A M Medical Record Number: [id number] Patient Account Number: [id number] Date of Birth/Sex: Treating RN: 1940/09/08 (81 y.o. Demani, Mcbrien, Cordova [name] ([id number]) [phone].pdf Page 6 of 10 Primary Care Merrin Mcvicker: [name] Other Clinician: Referring Jaymison Luber: Treating Alfreda Hammad/Extender: [name], [name]: 23 Active Inactive Necrotic Tissue Nursing Diagnoses: Impaired tissue integrity related to necrotic/devitalized tissue Knowledge deficit related to management of necrotic/devitalized tissue Goals: Necrotic/devitalized tissue will be minimized in the wound bed Date Initiated: 03/31/2022 Target Resolution Date: 03/31/2022 Goal Status: Active Patient/caregiver will verbalize understanding of reason and process for debridement of necrotic tissue Date Initiated: 03/31/2022 Target Resolution Date: 03/31/2022 Goal Status: Active Interventions: Assess patient pain level pre-, during and post procedure and prior to discharge Provide education on necrotic tissue and debridement process [name] Activities: Excisional debridement :  03/31/2022 Notes: Wound/Skin Impairment Nursing Diagnoses: Knowledge deficit related to ulceration/compromised skin integrity Goals: Patient/caregiver will verbalize understanding of skin care regimen Date Initiated: 01/16/2022 Target Resolution Date: 02/16/2022 Goal Status: Active Ulcer/skin breakdown will have a volume reduction of 30% by week 4 Date Initiated: 01/16/2022 Target Resolution Date: 02/16/2022 Goal Status: Active Ulcer/skin breakdown will have a volume reduction of 50% by week 8 Date Initiated: 01/16/2022 Target Resolution Date: 03/19/2022 Goal Status: Active Ulcer/skin breakdown will have a volume reduction of 80% by week 12 Date Initiated: 01/16/2022 Target Resolution Date: 04/18/2022 Goal Status: Active Ulcer/skin breakdown will heal within 14 [name] Date Initiated: 01/16/2022 Target Resolution Date: 05/19/2022 Goal Status: Active Interventions: Assess patient/caregiver ability to obtain necessary supplies Assess patient/caregiver ability to perform ulcer/skin care regimen upon  admission and as needed Assess ulceration(s) every visit Notes: Electronic Signature(s) Signed: 06/26/2022 4:56:13 PM By: [name] Loud MSN RN CNS WTA Entered By: [name] on 06/26/2022 08:12:40 Devers, Kelyn [name] ([id number]) [id number] Page 7 of 10 -------------------------------------------------------------------------------- Pain Assessment Details Patient Name: Date of Service: [name] [facility] [name]. 06/26/2022 7:45 A M Medical Record Number: [id number] Patient Account Number: [id number] Date of Birth/Sex: Treating RN: 1941-03-17 (81 y.o. [name] Primary Care Nicole Hafley: [name] Other Clinician: Referring Debbrah Sampedro: Treating Joyelle Siedlecki/Extender: [name], [name]: 23 Active Problems Location of Pain Severity and Description of Pain Patient Has Paino No Site Locations Pain Management and Medication Current Pain  Management: Electronic Signature(s) Signed: 06/26/2022 4:56:13 PM By: [name] Loud MSN RN CNS WTA Entered By: [name] on 06/26/2022 08:17:53 -------------------------------------------------------------------------------- Patient/Caregiver Education Details Patient Name: Date of Service: [name] [facility] [name]. 12/28/2023andnbsp7:45 A M Medical Record Number: [id number] Patient Account Number: [id number] Date of Birth/Gender: Treating RN: 1940/08/14 (81 y.o. [name] Primary Care Physician: [name] Other Clinician: Referring Physician: Treating Physician/Extender: [name], [name]: [address] [place] ([id number]) [id number] Page 8 of 10 Education Assessment Education Provided To: Patient Education Topics Provided Wound/Skin Impairment: Handouts: Caring for Your Ulcer Methods: Explain/Verbal Responses: State content correctly Electronic Signature(s) Signed: 06/26/2022 4:56:13 PM By: [name] Loud MSN RN CNS WTA Entered By: [name] on 06/26/2022 08:12:35 -------------------------------------------------------------------------------- Wound Assessment Details Patient Name: Date of Service: [name] [facility] [name]. 06/26/2022 7:45 A M Medical Record Number: [id number] Patient Account Number: [id number] Date of Birth/Sex: Treating RN: 07-04-1940 (81 y.o. [name] Primary Care Jaymari Cromie: [name] Other Clinician: Referring Amaranta Mehl: Treating Claudette Wermuth/Extender: [name], [name]: 23 Wound Status Wound Number: 2 Primary Etiology: Neuropathic Ulcer-Non Diabetic Wound Location: Right, Plantar Foot Wound Status: Open Wounding Event: Gradually Appeared Comorbid History: Congestive Heart Failure, Hypertension Date Acquired: 12/28/2020 [name] Of [name]: 23 Clustered Wound: No Photos Wound Measurements Length: (cm) 0.3 Width: (cm) 0.1 Depth: (cm) 0.1 Area:  (cm) 0.024 Volume: (cm) 0.002 % Reduction in Area: 69.6% % Reduction in Volume: 91.7% Epithelialization: Medium (34-66%) Wound Description Classification: Full Thickness Without Exposed Support Exudate Amount: Medium Exudate Type: Serosanguineous Meche, Gurvir [name] ([id number]) Exudate Color: red, brown Structures Foul Odor After Cleansing: No Slough/Fibrino No [id number] Page 9 of 10 Wound Bed Granulation Amount: Small (1-33%) Exposed Structure Granulation Quality: Red Fascia Exposed: No Necrotic Amount: None Present (0%) Fat Layer (Subcutaneous Tissue) Exposed: Yes Tendon Exposed: No Muscle Exposed: No Joint Exposed: No Bone Exposed: No [name] Notes Wound #2 (Foot) Wound Laterality: Plantar, Right Cleanser Wound Cleanser Discharge Instruction: Wash your hands with soap and water. Remove old dressing, discard into plastic bag and place into trash. Cleanse the wound with Wound Cleanser prior to applying a clean dressing using gauze sponges, not tissues or cotton balls. Do not scrub or use excessive force. Pat dry using gauze sponges, not tissue or cotton balls. Peri-Wound Care Topical Primary Dressing Silvercel Small 2x2 (in/in) Discharge Instruction: Apply Silvercel Small 2x2 (in/in) as instructed Secondary Dressing Secured With Tubigrip Size E, 3.5x10 (in/yds) Discharge Instruction: Apply 3 Tubigrip E 3-finger-widths below knee to base of toes to secure dressing and/or for swelling. Compression Wrap Compression Stockings Add-Ons Electronic Signature(s) Signed: 06/26/2022 4:56:13 PM By: [name] Loud MSN RN CNS WTA Entered By: [name] on 06/26/2022 08:05:48 -------------------------------------------------------------------------------- Vitals Details Patient Name: Date  of Service: [name] [facility] [name]. 06/26/2022 7:45 A M Medical Record Number: [id number] Patient Account Number: [id number] Date of Birth/Sex: Treating RN: 1940/07/24  (81 y.o. [name] Primary Care Murphy Duzan: [name] Other Clinician: Referring Tauri Ethington: Treating Kaleel Schmieder/Extender: [name], [name]: 23 Vital Signs Time Taken: 07:56 Temperature (F): 97.8 Height (in): 74 Pulse (bpm): 67 Weight (lbs): 244 Respiratory Rate (breaths/min): 16 Body Mass Index (BMI): 31.3 Blood Pressure (mmHg): 95/56 Reference Range: 80 - 120 mg / dl Gange, Zhaire [name] ([id number]) [id number] Page 10 of 10 Electronic Signature(s) Signed: 06/26/2022 4:56:13 PM By: [name] Loud MSN RN CNS WTA Entered By: [name] on 06/26/2022 08:17:49

## 2022-07-02 DIAGNOSIS — I509 Heart failure, unspecified: Secondary | ICD-10-CM | POA: Diagnosis not present

## 2022-07-02 DIAGNOSIS — G8918 Other acute postprocedural pain: Secondary | ICD-10-CM | POA: Diagnosis not present

## 2022-07-02 DIAGNOSIS — I10 Essential (primary) hypertension: Secondary | ICD-10-CM | POA: Diagnosis not present

## 2022-07-02 DIAGNOSIS — R82998 Other abnormal findings in urine: Secondary | ICD-10-CM | POA: Diagnosis not present

## 2022-07-02 DIAGNOSIS — E785 Hyperlipidemia, unspecified: Secondary | ICD-10-CM | POA: Diagnosis not present

## 2022-07-02 DIAGNOSIS — R609 Edema, unspecified: Secondary | ICD-10-CM | POA: Diagnosis not present

## 2022-07-02 DIAGNOSIS — N1832 Chronic kidney disease, stage 3b: Secondary | ICD-10-CM | POA: Diagnosis not present

## 2022-07-02 DIAGNOSIS — I4821 Permanent atrial fibrillation: Secondary | ICD-10-CM | POA: Diagnosis not present

## 2022-07-04 ENCOUNTER — Encounter: Payer: PPO | Attending: Physician Assistant | Admitting: Physician Assistant

## 2022-07-04 DIAGNOSIS — I2510 Atherosclerotic heart disease of native coronary artery without angina pectoris: Secondary | ICD-10-CM | POA: Insufficient documentation

## 2022-07-04 DIAGNOSIS — L97512 Non-pressure chronic ulcer of other part of right foot with fat layer exposed: Secondary | ICD-10-CM | POA: Insufficient documentation

## 2022-07-04 DIAGNOSIS — I5042 Chronic combined systolic (congestive) and diastolic (congestive) heart failure: Secondary | ICD-10-CM | POA: Diagnosis not present

## 2022-07-04 DIAGNOSIS — Z7901 Long term (current) use of anticoagulants: Secondary | ICD-10-CM | POA: Diagnosis not present

## 2022-07-04 DIAGNOSIS — G9009 Other idiopathic peripheral autonomic neuropathy: Secondary | ICD-10-CM | POA: Diagnosis not present

## 2022-07-04 DIAGNOSIS — L97522 Non-pressure chronic ulcer of other part of left foot with fat layer exposed: Secondary | ICD-10-CM | POA: Insufficient documentation

## 2022-07-04 DIAGNOSIS — I87333 Chronic venous hypertension (idiopathic) with ulcer and inflammation of bilateral lower extremity: Secondary | ICD-10-CM | POA: Insufficient documentation

## 2022-07-04 DIAGNOSIS — I130 Hypertensive heart and chronic kidney disease with heart failure and stage 1 through stage 4 chronic kidney disease, or unspecified chronic kidney disease: Secondary | ICD-10-CM | POA: Diagnosis not present

## 2022-07-04 DIAGNOSIS — I480 Paroxysmal atrial fibrillation: Secondary | ICD-10-CM | POA: Insufficient documentation

## 2022-07-04 DIAGNOSIS — L84 Corns and callosities: Secondary | ICD-10-CM | POA: Diagnosis not present

## 2022-07-04 DIAGNOSIS — L97822 Non-pressure chronic ulcer of other part of left lower leg with fat layer exposed: Secondary | ICD-10-CM | POA: Diagnosis not present

## 2022-07-04 DIAGNOSIS — N1832 Chronic kidney disease, stage 3b: Secondary | ICD-10-CM | POA: Diagnosis not present

## 2022-07-04 DIAGNOSIS — I420 Dilated cardiomyopathy: Secondary | ICD-10-CM | POA: Diagnosis not present

## 2022-07-04 DIAGNOSIS — L97812 Non-pressure chronic ulcer of other part of right lower leg with fat layer exposed: Secondary | ICD-10-CM | POA: Diagnosis not present

## 2022-07-04 NOTE — Progress Notes (Addendum)
[name], [name] ([id number]) [id number] Page 1 of 9 Visit Report for 07/04/2022 Arrival Information Details Patient Name: Date of Service: [name] [facility] [name]. 07/04/2022 12:00 PM Medical Record Number: [id number] Patient Account Number: [id number] Date of Birth/Sex: Treating RN: Oct 05, 1940 (82 y.o. [name] Primary Care Ilona Colley: [name] Other Clinician: Massie [name] Referring [name]: Treating [name]: [name] in Treatment: 24 Visit Information History Since Last Visit All ordered tests and consults were completed: No Patient Arrived: [name] Added or deleted any medications: No Arrival Time: 12:20 Any new allergies or adverse reactions: No Transfer Assistance: None Had a fall or experienced change in No Patient Identification Verified: Yes activities of daily living that may affect Secondary Verification Process Completed: Yes risk of falls: Patient Requires Transmission-Based Precautions: No Signs or symptoms of abuse/neglect since last visito No Patient Has Alerts: Yes Hospitalized since last visit: No Patient Alerts: ABI R 0.69 L 0.66 Implantable device outside of the clinic excluding No TBI R 0.41 L 0.16 cellular tissue based products placed in the center since last visit: Has Dressing in Place as Prescribed: Yes Has Compression in Place as Prescribed: Yes Pain Present Now: No Electronic Signature(s) Signed: 07/04/2022 1:15:46 PM By: [name] Entered By: [name] on 07/04/2022 12:22:42 -------------------------------------------------------------------------------- Clinic Level of Care Assessment Details Patient Name: Date of Service: [name] [facility] [name]. 07/04/2022 12:00 PM Medical Record Number: [id number] Patient Account Number: [id number] Date of Birth/Sex: Treating RN: May 13, 1941 (82 y.o. [name] Primary Care Aly Seidenberg: [name] Other Clinician: Massie [name] Referring  [name]: Treating [name]/Extender: [name] in Treatment: 24 Clinic Level of Care Assessment Items TOOL 1 Quantity Score []  - 0 Use when EandM and Procedure is performed on INITIAL visit ASSESSMENTS - Nursing Assessment / Reassessment []  - 0 General Physical Exam (combine w/ comprehensive assessment (listed just below) when performed on new pt. evalsMANAV, [name] ([id number]) [id number] Page 2 of 9 []  - 0 Comprehensive Assessment (HX, ROS, Risk Assessments, Wounds Hx, etc.) ASSESSMENTS - Wound and Skin Assessment / Reassessment []  - 0 Dermatologic / Skin Assessment (not related to wound area) ASSESSMENTS - Ostomy and/or Continence Assessment and Care []  - 0 Incontinence Assessment and Management []  - 0 Ostomy Care Assessment and Management (repouching, etc.) PROCESS - Coordination of Care []  - 0 Simple Patient / Family Education for ongoing care []  - 0 Complex (extensive) Patient / Family Education for ongoing care []  - 0 Staff obtains Programmer, systems, Records, T Results / Process Orders est []  - 0 Staff telephones HHA, Nursing Homes / Clarify orders / etc []  - 0 Routine Transfer to another Facility (non-emergent condition) []  - 0 Routine Hospital Admission (non-emergent condition) []  - 0 New Admissions / Biomedical engineer / Ordering NPWT Apligraf, etc. , []  - 0 Emergency Hospital Admission (emergent condition) PROCESS - Special Needs []  - 0 Pediatric / Minor Patient Management []  - 0 Isolation Patient Management []  - 0 Hearing / Language / Visual special needs []  - 0 Assessment of Community assistance (transportation, [name]/C planning, etc.) []  - 0 Additional assistance / Altered mentation []  - 0 Support Surface(s) Assessment (bed, cushion, seat, etc.) INTERVENTIONS - Miscellaneous []  - 0 External ear exam []  - 0 Patient Transfer (multiple staff / Civil Service fast streamer / Similar devices) []  - 0 Simple Staple /  Suture removal (25 or less) []  - 0 Complex Staple / Suture removal (26 or more) []  - 0 Hypo/Hyperglycemic Management (do not check if  billed separately) []  - 0 Ankle / Brachial Index (ABI) - do not check if billed separately Has the patient been seen at the hospital within the last three years: Yes Total Score: 0 Level Of Care: ____ Electronic Signature(s) Signed: 07/04/2022 1:15:46 PM By: [name] Entered By: [name] on 07/04/2022 12:48:28 -------------------------------------------------------------------------------- Compression Therapy Details Patient Name: Date of Service: [name] [name] 07/04/2022 12:00 PM Medical Record Number: [id number] Patient Account Number: [id number] Date of Birth/Sex: Treating RN: 04-Jan-1941 (82 y.o. [name] Primary Care Keelon Zurn: [name] Other Clinician: Regie, [name] ([id number]) [phone].pdf Page 3 of 9 Referring [name]: Treating [name]/Extender: [name] in Treatment: 24 Compression Therapy Performed for Wound Assessment: Wound #2 Right,Plantar Foot Performed By: Lenice Pressman, Angie, Compression Type: Three Layer Pre Treatment ABI: 0.6 Post Procedure Diagnosis Same as Pre-procedure Notes Patient has had revascularization on right lower leg Electronic Signature(s) Signed: 07/04/2022 1:15:46 PM By: [name] Entered By: [name] on 07/04/2022 12:45:57 -------------------------------------------------------------------------------- Encounter Discharge Information Details Patient Name: Date of Service: [name], [name]. 07/04/2022 12:00 PM Medical Record Number: [id number] Patient Account Number: [id number] Date of Birth/Sex: Treating RN: 1940/07/08 (82 y.o. [name] Primary Care [name]: [name] Other Clinician: Massie [name] Referring [name]: Treating [name]/Extender: [name] in  Treatment: 24 Encounter Discharge Information Items Post Procedure Vitals Discharge Condition: Stable Temperature (F): 97.7 Ambulatory Status: Walker Pulse (bpm): 56 Discharge Destination: Home Respiratory Rate (breaths/min): 18 Transportation: Private Auto Blood Pressure (mmHg): 107/46 Accompanied By: self Schedule Follow-up Appointment: Yes Clinical Summary of Care: Electronic Signature(s) Signed: 07/04/2022 1:15:46 PM By: [name] Entered By: [name] on 07/04/2022 13:03:38 -------------------------------------------------------------------------------- Lower Extremity Assessment Details Patient Name: Date of Service: [name] [facility] [name]. 07/04/2022 12:00 PM Medical Record Number: [id number] Patient Account Number: [id number] Date of Birth/Sex: Treating RN: 06-09-1941 (82 y.o. [name] Primary Care Doshie Maggi: [name] Other Clinician: Massie [name] Referring Margrett Kalb: Treating Candas Deemer/Extender: [name], [name] ([id number]) [phone].pdf Page 4 of 9 Weeks in Treatment: 24 Edema Assessment Assessed: [Left: No] [Right: Yes] Edema: [Left: Ye] [Right: s] Calf Left: Right: Point of Measurement: 32 cm From Medial Instep 40.2 cm Ankle Left: Right: Point of Measurement: 12 cm From Medial Instep 29.8 cm Vascular Assessment Pulses: Dorsalis Pedis Palpable: [Right:Yes] Electronic Signature(s) Signed: 07/04/2022 1:15:46 PM By: [name] Signed: 07/04/2022 1:40:16 PM By: Rosalio Loud MSN RN CNS WTA Entered By: [name] on 07/04/2022 12:33:34 -------------------------------------------------------------------------------- Multi Wound Chart Details Patient Name: Date of Service: [name], [name]. 07/04/2022 12:00 PM Medical Record Number: [id number] Patient Account Number: [id number] Date of Birth/Sex: Treating RN: [birth year] (82 y.o. [name] Primary Care Mayerli Kirst: [name] Other Clinician:  Massie [name] Referring Xian Apostol: Treating Shantea Poulton/Extender: [name] in Treatment: 24 Vital Signs Height(in): 74 Pulse(bpm): 56 Weight(lbs): 244 Blood Pressure(mmHg): 107/46 Body Mass Index(BMI): 31.3 Temperature(F): 97.7 Respiratory Rate(breaths/min): 18 [2:Photos:] [N/A:N/A] Right, Plantar Foot N/A N/A Wound Location: Gradually Appeared N/A N/A Wounding Event: Neuropathic Ulcer-Non Diabetic N/A N/A Primary Etiology: Congestive Heart Failure, N/A N/A Comorbid History: Hypertension 12/28/2020 N/A N/A Date AcquiredMERLIN, GOLDEN [name] ([id number]) [id number] Page 5 of 9 24 N/A N/A Weeks of Treatment: Open N/A N/A Wound Status: No N/A N/A Wound Recurrence: 0.4x0.3x0.1 N/A N/A Measurements L x W x [name] (cm) 0.094 N/A N/A A (cm) : rea 0.009 N/A N/A Volume (cm) : -19.00% N/A N/A % Reduction in  Area: 62.50% N/A N/A % Reduction in Volume: Full Thickness Without Exposed N/A N/A Classification: Support Structures Medium N/A N/A Exudate Amount: Serosanguineous N/A N/A Exudate Type: red, brown N/A N/A Exudate Color: Small (1-33%) N/A N/A Granulation Amount: Red N/A N/A Granulation Quality: None Present (0%) N/A N/A Necrotic Amount: Fat Layer (Subcutaneous Tissue): Yes N/A N/A Exposed Structures: Fascia: No Tendon: No Muscle: No Joint: No Bone: No Medium (34-66%) N/A N/A Epithelialization: Treatment Notes Electronic Signature(s) Signed: 07/04/2022 1:15:46 PM By: [name] Entered By: [name] on 07/04/2022 12:33:46 -------------------------------------------------------------------------------- [place] Details Patient Name: Date of Service: [name], [name]. 07/04/2022 12:00 PM Medical Record Number: [id number] Patient Account Number: [id number] Date of Birth/Sex: Treating RN: 07-25-1940 (82 y.o. [name] Primary Care Kirstyn Lean: [name] Other Clinician: Massie [name] Referring Sabastien Tyler: Treating Bridgett Hattabaugh/Extender: [name] in Treatment: 24 Active Inactive Necrotic Tissue Nursing Diagnoses: Impaired tissue integrity related to necrotic/devitalized tissue Knowledge deficit related to management of necrotic/devitalized tissue Goals: Necrotic/devitalized tissue will be minimized in the wound bed Date Initiated: 03/31/2022 Target Resolution Date: 03/31/2022 Goal Status: Active Patient/caregiver will verbalize understanding of reason and process for debridement of necrotic tissue Date Initiated: 03/31/2022 Target Resolution Date: 03/31/2022 Goal Status: Active Interventions: Assess patient pain level pre-, during and post procedure and prior to discharge Provide education on necrotic tissue and debridement process Treatment Activities: Excisional debridement : 03/31/2022 KAILAND, SEDA [name] ([id number]) [id number] Page 6 of 9 Notes: Wound/Skin Impairment Nursing Diagnoses: Knowledge deficit related to ulceration/compromised skin integrity Goals: Patient/caregiver will verbalize understanding of skin care regimen Date Initiated: 01/16/2022 Target Resolution Date: 02/16/2022 Goal Status: Active Ulcer/skin breakdown will have a volume reduction of 30% by week 4 Date Initiated: 01/16/2022 Target Resolution Date: 02/16/2022 Goal Status: Active Ulcer/skin breakdown will have a volume reduction of 50% by week 8 Date Initiated: 01/16/2022 Target Resolution Date: 03/19/2022 Goal Status: Active Ulcer/skin breakdown will have a volume reduction of 80% by week 12 Date Initiated: 01/16/2022 Target Resolution Date: 04/18/2022 Goal Status: Active Ulcer/skin breakdown will heal within 14 weeks Date Initiated: 01/16/2022 Target Resolution Date: 05/19/2022 Goal Status: Active Interventions: Assess patient/caregiver ability to obtain necessary supplies Assess patient/caregiver ability to perform ulcer/skin care  regimen upon admission and as needed Assess ulceration(s) every visit Notes: Electronic Signature(s) Signed: 07/04/2022 1:15:46 PM By: [name] Signed: 07/04/2022 1:40:16 PM By: Rosalio Loud MSN RN CNS WTA Entered By: [name] on 07/04/2022 13:02:51 -------------------------------------------------------------------------------- Pain Assessment Details Patient Name: Date of Service: [name] [facility] [name]. 07/04/2022 12:00 PM Medical Record Number: [id number] Patient Account Number: [id number] Date of Birth/Sex: Treating RN: [birth year] (82 y.o. [name] Primary Care Stevin Bielinski: [name] Other Clinician: Massie [name] Referring Zavon Hyson: Treating Valissa Lyvers/Extender: [name] in Treatment: 24 Active Problems Location of Pain Severity and Description of Pain Patient Has Paino No Site Locations [name], [name] ([id number]) [phone].pdf Page 7 of 9 Pain Management and Medication Current Pain Management: Electronic Signature(s) Signed: 07/04/2022 1:15:46 PM By: [name] Signed: 07/04/2022 1:40:16 PM By: Rosalio Loud MSN RN CNS WTA Entered By: [name] on 07/04/2022 12:26:48 -------------------------------------------------------------------------------- Patient/Caregiver Education Details Patient Name: Date of Service: [name] [facility] [name]. 1/5/2024andnbsp12:00 PM Medical Record Number: [id number] Patient Account Number: [id number] Date of Birth/Gender: Treating RN: 06-Jul-1940 (82 y.o. [name] Primary Care Physician: [name] Other Clinician: Massie [name] Referring Physician: Treating Physician/Extender: [name] in Treatment: 24 Education Assessment Education Provided To: Patient Education  Topics Provided Wound/Skin Impairment: Handouts: Other: continue wound care as directed Methods: Explain/Verbal Responses: State content correctly Electronic Signature(s) Signed:  07/04/2022 1:15:46 PM By: [name] Entered By: [name] on 07/04/2022 13:02:47 Heidecker, Rabon [name] ([id number]) [id number] Page 8 of 9 -------------------------------------------------------------------------------- Wound Assessment Details Patient Name: Date of Service: [name] [facility] [name]. 07/04/2022 12:00 PM Medical Record Number: [id number] Patient Account Number: [id number] Date of Birth/Sex: Treating RN: 08-07-1940 (82 y.o. [name] Primary Care Elianah Karis: [name] Other Clinician: Massie [name] Referring Izabela Ow: Treating Larnell Granlund/Extender: [name] in Treatment: 24 Wound Status Wound Number: 2 Primary Etiology: Neuropathic Ulcer-Non Diabetic Wound Location: Right, Plantar Foot Wound Status: Open Wounding Event: Gradually Appeared Comorbid History: Congestive Heart Failure, Hypertension Date Acquired: 12/28/2020 Weeks Of Treatment: 24 Clustered Wound: No Photos Wound Measurements Length: (cm) 0.4 Width: (cm) 0.3 Depth: (cm) 0.1 Area: (cm) 0.094 Volume: (cm) 0.009 % Reduction in Area: -19% % Reduction in Volume: 62.5% Epithelialization: Medium (34-66%) Wound Description Classification: Full Thickness Without Exposed Support Structures Exudate Amount: Medium Exudate Type: Serosanguineous Exudate Color: red, brown Foul Odor After Cleansing: No Slough/Fibrino No Wound Bed Granulation Amount: Small (1-33%) Exposed Structure Granulation Quality: Red Fascia Exposed: No Necrotic Amount: None Present (0%) Fat Layer (Subcutaneous Tissue) Exposed: Yes Tendon Exposed: No Muscle Exposed: No Joint Exposed: No Bone Exposed: No Treatment Notes Wound #2 (Foot) Wound Laterality: Plantar, Right Cleanser Wound Cleanser Discharge Instruction: Wash your hands with soap and water. Remove old dressing, discard into plastic bag and place into trash. Cleanse the wound with Wound Cleanser prior to applying a clean  dressing using gauze sponges, not tissues or cotton balls. Do not scrub or use excessive force. Pat dry using gauze sponges, not tissue or cotton balls. ROGERICK, BALDWIN ([id number]) [id number] Page 9 of 9 Peri-Wound Care Topical Primary Dressing Prisma 4.34 (in) Discharge Instruction: Moisten w/normal saline or sterile water; Cover wound as directed. Do not remove from wound bed. Secondary Dressing Coverlet Latex-Free Fabric Adhesive Dressings Discharge Instruction: 1.5 x 2 Secured With Compression Wrap 3-LAYER WRAP - Profore Lite LF 3 Multilayer Compression Bandaging System Discharge Instruction: Apply 3 multi-layer wrap as prescribed. Compression Stockings Add-Ons Electronic Signature(s) Signed: 07/04/2022 1:15:46 PM By: [name] Signed: 07/04/2022 1:40:16 PM By: Rosalio Loud MSN RN CNS WTA Entered By: [name] on 07/04/2022 12:32:28 -------------------------------------------------------------------------------- Vitals Details Patient Name: Date of Service: [name], [name]. 07/04/2022 12:00 PM Medical Record Number: [id number] Patient Account Number: [id number] Date of Birth/Sex: Treating RN: 1940-10-06 (82 y.o. [name] Primary Care Abcde Oneil: [name] Other Clinician: Massie [name] Referring Lawrence Roldan: Treating Daxtin Leiker/Extender: [name] in Treatment: 24 Vital Signs Time Taken: 12:23 Temperature (F): 97.7 Height (in): 74 Pulse (bpm): 56 Weight (lbs): 244 Respiratory Rate (breaths/min): 18 Body Mass Index (BMI): 31.3 Blood Pressure (mmHg): 107/46 Reference Range: 80 - 120 mg / dl Electronic Signature(s) Signed: 07/04/2022 1:15:46 PM By: [name] Entered By: [name] on 07/04/2022 12:26:15

## 2022-07-04 NOTE — Progress Notes (Addendum)
LONZELL, DORRIS ([id number]) [id number] Page 1 of 10 Visit Report for 07/04/2022 Chief Complaint Document Details Patient Name: Date of Service: [name] [facility] D. 07/04/2022 12:00 PM Medical Record Number: [id number] Patient Account Number: [id number] Date of Birth/Sex: Treating RN: 03-Jun-1941 (82 y.o. [name] Primary Care Provider: [name] Other Clinician: Massie Kluver Referring Provider: Treating Provider/Extender: [name] in Treatment: 24 Information Obtained from: Patient Chief Complaint Multiple LE Ulcers Electronic Signature(s) Signed: 07/04/2022 12:01:51 PM By: Worthy Keeler PA-C Entered By: Worthy Keeler on 07/04/2022 12:01:51 -------------------------------------------------------------------------------- Debridement Details Patient Name: Date of Service: [name] [facility] D. 07/04/2022 12:00 PM Medical Record Number: [id number] Patient Account Number: [id number] Date of Birth/Sex: Treating RN: 24-Sep-1940 (82 y.o. [name] Primary Care Provider: [name] Other Clinician: Massie Kluver Referring Provider: Treating Provider/Extender: [name] in Treatment: 24 Debridement Performed for Assessment: Wound #2 Right,Plantar Foot Performed By: Physician [name]., PA-C Debridement Type: Debridement Level of Consciousness (Pre-procedure): Awake and Alert Pre-procedure Verification/Time Out Yes - 12:42 Taken: Start Time: 12:42 T Area Debrided (L x W): otal 1 (cm) x 1 (cm) = 1 (cm) Tissue and other material debrided: Viable, Non-Viable, Callus, Slough, Subcutaneous, Slough Level: Skin/Subcutaneous Tissue Debridement Description: Excisional Instrument: Curette Bleeding: Minimum Hemostasis Achieved: Pressure Response to Treatment: Procedure was tolerated well Level of Consciousness (Post- Awake and Alert procedure): Post Debridement Measurements of Total  Wound [name], [name] ([id number]) [id number] Page 2 of 10 Length: (cm) 0.4 Width: (cm) 0.3 Depth: (cm) 0.1 Volume: (cm) 0.009 Character of Wound/Ulcer Post Debridement: Stable Post Procedure Diagnosis Same as Pre-procedure Electronic Signature(s) Signed: 07/04/2022 1:15:46 PM By: Massie Kluver Signed: 07/04/2022 1:38:11 PM By: Worthy Keeler PA-C Signed: 07/04/2022 1:40:16 PM By: Rosalio Loud MSN RN CNS WTA Entered By: Massie Kluver on 07/04/2022 12:43:43 -------------------------------------------------------------------------------- HPI Details Patient Name: Date of Service: [name], [facility] D. 07/04/2022 12:00 PM Medical Record Number: [id number] Patient Account Number: [id number] Date of Birth/Sex: Treating RN: 08-05-1940 (82 y.o. [name] Primary Care Provider: [name] Other Clinician: Massie Kluver Referring Provider: Treating Provider/Extender: [name] in Treatment: 24 History of Present Illness HPI Description: 01-16-2022 upon evaluation today patient presents for initial inspection here in the clinic concerning issues that he has been having with wounds over the bilateral lower extremities and bilateral feet. Fortunately there does not appear to be any signs of significant infection at this point which is great news. Unfortunately his ABIs were registering at 0.49 on the left and 0.57 on the right here in the clinic on the screening today. With that being said I do think that it may possibly be true that we need to get him to formal arterial studies in order to see how things really are showing up. And the patient's not in disagreement with this for that reason we are going to make that referral as well. With that being said he does have some significant past medical history items of note detailed below. Patient does have chronic kidney disease stage IIIb, coronary artery disease, long-term use of anticoagulant  therapy, dilated cardiomyopathy. Proximal atrial fibrillation, he is on Xarelto long-term. He also has congestive heart failure, hypertension, peripheral neuropathy not related to diabetes as he is only prediabetic with a hemoglobin A1c of 5.8, and chronic venous hypertension. He has recently seen Dr. [name] show his cardiologist who did discontinue the hydrochlorothiazide and the Lasix and felt that the patient  was retaining more fluid than he should be. For that reason he was started on furosemide 20 mg twice daily. 7/27; patient with bilateral lower extremity wounds. These are largely venous although he had very poor ABIs on screening test here. He has an appointment apparently tomorrow at vein and vascular for arterial studies. He was not too keen on the latter although I think I emphasized the need to do this both in terms of dealing with the current wounds and being prepared for further issues down the road. We have been using silver alginate on the wounds and Tubigrip's edema control is better per our intake nurse 02-06-2022 upon evaluation today patient appears to be doing well with regard to his wound. Fortunately there is no signs of infection he is actually healing for the most part on the bilateral feet. His legs are also doing much better though he has 1 new area that popped up on the right leg. Fortunately there is no evidence of active infection of note his ABIs and TBI's were poor on the vascular screening on the 31st with the left being worse than the right. For that reason I am going to recommend he still continue to follow-up with vascular to discuss angioplasty/stenting. [id number] upon evaluation today patient appears to be doing well currently in regard to his wound on the leg although he has several other blisters that are showing up at this point. I do believe that we need to try to do something to get his swelling under control this could be of utmost importance as far as  getting this healed is concerned. 03-06-2022 upon evaluation today patient appears to be doing well currently in regard to his wounds. In fact the legs are drying up and seem to be healing quite nicely the lateral wounds are still weeping a little bit about the medial side actually appears to be pretty much dry and closed. The plantar aspect of his foot still is draining a bit but again it seems to be doing much better which is great news.[name] 03-13-2022 upon evaluation patient appears to be almost completely healed. His leg is doing excellent and his foot is just about closed. With that being said he did see vascular and they actually discussed the possibility of doing an arteriogram/angiogram in order to see if anything needed to be the balloons were stented open. With that being said he decided not to do that since things are healing so well right now and he is going to consider that for future they plan to see him back in November. 03-21-2022 upon evaluation today patient appears to be doing a little worse in regard to his wound on the foot. The wound keeps seeming to pull together unfortunately which is causing a trapped some fluid is just not healing quite as well as would like to see. With that being said I am going to try to remove a bit more of the overgrowth callus tissue today and hopefully that can help get this to seal up much more effectively and quickly. Fortunately I see no signs of infection of the foot although the leg on the right does seem to show some signs of erythema today I do think we need to address that currently. This looks to [name], [name] D ([id number]) [id number] Page 3 of 10 be cellulitis. 03-31-2022 upon evaluation today patient appears to be doing better in regard to the cellulitis I am pleased in that regard he is still taking the antibiotics. Fortunately I  do not see any evidence of active infection locally or systemically at this time which  is great news. No fevers, chills, nausea, vomiting, or diarrhea. 04-10-2022 upon evaluation today patient appears to be doing well currently in regard to his foot ulcer I think were very close to complete resolution which is great news. Fortunately I do not see any signs of active infection locally or systemically at this time. No fevers, chills, nausea, vomiting, or diarrhea. 04-24-2022 upon evaluation today patient appears to be doing about the same in regard to his foot ulcer. Were not really seeing much in the way of improvements here. With that being said I do believe that he is tolerating the dressing changes without complication. No fevers, chills, nausea, vomiting, or diarrhea. With that being said the wound on his right plantar foot just does not seem to be making progress here. I do believe that he may benefit from vascular intervention which we previously discussed but he is previously wanted to hold off on. However at this point I think that this is becoming more crucial and he did ask me about getting in touch with the vascular doctor in order to see if they can get him scheduled sooner his next follow-up until like the end of December. I am more than happy to do this as I think it would be definitely beneficial for him. 05-01-2022 upon evaluation patient's wound bed actually showed signs of good granulation and epithelization at this point all things considered. Still his ABIs are very low is about 50% on each leg. With that being said he does have a follow-up with vascular in about 2 weeks maybe 2-1/2 weeks where he is going to have the angiogram for the right leg and then subsequently the left leg will be to follow. Nonetheless I think this is can make a big difference in getting the wounds healed to be honest. Fortunately I do not see any signs of active infection at this point. 11/9; patient arrives in clinic with 2 new superficial wounds on the right leg and right ankle. Both of  these and the same condition of the original wound. In terms of the wound on his plantar foot about the same. We have been using collagen. His arterial review/angiogram is due for November 21 05-15-2022 upon evaluation today patient appears to be doing well currently in regard to his wounds all things considered. He actually has an appointment upcoming with the vascular surgeon where he is going to be undergoing intervention. Obviously I think this could be beneficial as far as helping this right leg and foot to heal it also allows to be able to put compression on which I think is much needed. He voiced understanding. That is on 21 November.. 05-29-2022 upon evaluation today patient appears to be doing significantly better in regard to his leg in general he seems to have much better blood flow compared to previous. Fortunately there does not appear to be any signs of infection locally nor systemically at this time. The good news is post his intervention procedure with Dr. Delana Meyer he only has 10% residual blockage in the right leg and to be honest this already looks much better from a color perspective I am very pleased with where we stand. 06-05-2022 upon evaluation today patient appears to be doing well currently in regard to his wounds. He has been tolerating the dressing changes the legs all appear to be healed. In regard to the foot this is measuring much smaller  and doing much better and overall I am extremely pleased with where we stand currently. I do not see any signs of active infection locally or systemically at this time. 06-12-2022 upon evaluation today patient appears to be doing well in regard to his right foot ulcer which is almost completely healed I am actually very pleased with where things stand. I do not see any signs of active infection at this time. 06-19-2022 upon evaluation patient actually appears to be making excellent progress at this point. Fortunately I do not see any signs  of infection locally nor systemically which is great news and overall I am extremely pleased with where we are currently. In general I think that the patient is very close to complete resolution in regard to his foot. 12/28; patient comes in with a small wound on his right plantar foot a nondiabetic neuropathic wound also in the setting with lower extremity chronic venous insufficiency and apparently he has had interventions for PAD. He was hoping that the wound would be closed this week. Has been using Prisma and a peg assist shoe 07-04-2022 upon evaluation today patient appears to be doing well currently in regard to his foot ulcer which is actually showing signs of improvement. Unfortunately his leg is starting to open up is not wearing like he is supposed to continue. Fortunately I do not see any signs of infection which is good news but at the same time I do not think that this is doing nearly as well as what he like to see. Electronic Signature(s) Signed: 07/04/2022 12:48:10 PM By: Worthy Keeler PA-C Entered By: Worthy Keeler on 07/04/2022 12:48:09 -------------------------------------------------------------------------------- Physical Exam Details Patient Name: Date of Service: [name] [facility] D. 07/04/2022 12:00 PM Medical Record Number: [id number] Patient Account Number: [id number] Date of Birth/Sex: Treating RN: [birth year] (82 y.o. [name] Primary Care Provider: [name] Other Clinician: Massie Kluver Referring Provider: Treating Provider/Extender: [name] in Treatment: [address] [name], [name] D ([id number]) [id number] Page 4 of 10 Well-nourished and well-hydrated in no acute distress. Respiratory normal breathing without difficulty. Psychiatric this patient is able to make decisions and demonstrates good insight into disease process. Alert and Oriented x 3. pleasant and cooperative. Notes Upon  inspection patient's wound bed actually showed signs of good granulation epithelization at this point. Fortunately I do not see any signs of infection locally nor systemically which is great news and overall very pleased in that regard. Nonetheless I do believe that we are headed in the right direction based on what we are seeing. We are attempting to wrap his leg to get the edema and swelling under control. Electronic Signature(s) Signed: 07/04/2022 12:48:34 PM By: Worthy Keeler PA-C Entered By: Worthy Keeler on 07/04/2022 12:48:34 -------------------------------------------------------------------------------- Physician Orders Details Patient Name: Date of Service: [name] [facility] D. 07/04/2022 12:00 PM Medical Record Number: [id number] Patient Account Number: [id number] Date of Birth/Sex: Treating RN: 1941-03-13 (82 y.o. [name] Primary Care Provider: [name] Other Clinician: Massie Kluver Referring Provider: Treating Provider/Extender: [name] in Treatment: 24 Verbal / Phone Orders: No Diagnosis Coding ICD-10 Coding Code Description [phone] Chronic venous hypertension (idiopathic) with ulcer and inflammation of bilateral lower extremity L97.812 Non-pressure chronic ulcer of other part of right lower leg with fat layer exposed L97.822 Non-pressure chronic ulcer of other part of left lower leg with fat layer exposed G90.09 Other idiopathic peripheral autonomic neuropathy L97.522 Non-pressure chronic ulcer of other  part of left foot with fat layer exposed L97.512 Non-pressure chronic ulcer of other part of right foot with fat layer exposed I10 Essential (primary) hypertension I50.42 Chronic combined systolic (congestive) and diastolic (congestive) heart failure I48.0 Paroxysmal atrial fibrillation I42.0 Dilated cardiomyopathy Z79.01 Long term (current) use of anticoagulants I25.10 Atherosclerotic heart disease of native coronary artery without  angina pectoris N18.32 Chronic kidney disease, stage 3b Follow-up Appointments Return Appointment in 1 week. Nurse Visit as needed Bathing/ Shower/ Hygiene May shower with wound dressing protected with water repellent cover or cast protector. Edema Control - Lymphedema / Segmental Compressive Device / Other 3 Layer Compression System for Lymphedema. Elevate, Exercise Daily and A void Standing for Long Periods of Time. Elevate legs to the level of the heart and pump ankles as often as possible Elevate leg(s) parallel to the floor when sitting. Off-Loading [name], [name] ([id number]) [id number] Page 5 of 10 Right Lower Extremity Open toe surgical shoe with peg assist. Wound Treatment Wound #2 - Foot Wound Laterality: Plantar, Right Cleanser: Wound Cleanser 1 x Per Week/30 Days Discharge Instructions: Wash your hands with soap and water. Remove old dressing, discard into plastic bag and place into trash. Cleanse the wound with Wound Cleanser prior to applying a clean dressing using gauze sponges, not tissues or cotton balls. Do not scrub or use excessive force. Pat dry using gauze sponges, not tissue or cotton balls. Prim Dressing: Prisma 4.34 (in) 1 x Per Week/30 Days ary Discharge Instructions: Moisten w/normal saline or sterile water; Cover wound as directed. Do not remove from wound bed. Secondary Dressing: Coverlet Latex-Free Fabric Adhesive Dressings 1 x Per Week/30 Days Discharge Instructions: 1.5 x 2 Compression Wrap: 3-LAYER WRAP - Profore Lite LF 3 Multilayer Compression Bandaging System 1 x Per Week/30 Days Discharge Instructions: Apply 3 multi-layer wrap as prescribed. Electronic Signature(s) Signed: 07/04/2022 1:15:46 PM By: Massie Kluver Signed: 07/04/2022 1:38:11 PM By: Worthy Keeler PA-C Entered By: Massie Kluver on 07/04/2022 12:48:21 -------------------------------------------------------------------------------- Problem List  Details Patient Name: Date of Service: [name] [facility] D. 07/04/2022 12:00 PM Medical Record Number: [id number] Patient Account Number: [id number] Date of Birth/Sex: Treating RN: 1941-01-01 (82 y.o. [name] Primary Care Provider: [name] Other Clinician: Massie Kluver Referring Provider: Treating Provider/Extender: [name] in Treatment: 24 Active Problems ICD-10 Encounter Code Description Active Date MDM Diagnosis I87.333 Chronic venous hypertension (idiopathic) with ulcer and inflammation of 01/16/2022 No Yes bilateral lower extremity L97.812 Non-pressure chronic ulcer of other part of right lower leg with fat layer 01/16/2022 No Yes exposed L97.822 Non-pressure chronic ulcer of other part of left lower leg with fat layer exposed 01/16/2022 No Yes G90.09 Other idiopathic peripheral autonomic neuropathy 01/16/2022 No Yes L97.522 Non-pressure chronic ulcer of other part of left foot with fat layer exposed 01/16/2022 No Yes [name], [name] D ([id number]) [id number] Page 6 of 10 [phone] Non-pressure chronic ulcer of other part of right foot with fat layer exposed 01/16/2022 No Yes I10 Essential (primary) hypertension 01/16/2022 No Yes I50.42 Chronic combined systolic (congestive) and diastolic (congestive) heart failure 01/16/2022 No Yes I48.0 Paroxysmal atrial fibrillation 01/16/2022 No Yes I42.0 Dilated cardiomyopathy 01/16/2022 No Yes Z79.01 Long term (current) use of anticoagulants 01/16/2022 No Yes I25.10 Atherosclerotic heart disease of native coronary artery without angina pectoris 01/16/2022 No Yes N18.32 Chronic kidney disease, stage 3b 01/16/2022 No Yes Inactive Problems Resolved Problems Electronic Signature(s) Signed: 07/04/2022 12:01:48 PM By: Worthy Keeler PA-C Entered By: Worthy Keeler on 07/04/2022 12:01:48 --------------------------------------------------------------------------------  Progress Note  Details Patient Name: Date of Service: [name] [facility] D. 07/04/2022 12:00 PM Medical Record Number: [id number] Patient Account Number: [id number] Date of Birth/Sex: Treating RN: 21-Sep-1940 (82 y.o. [name] Primary Care Provider: [name] Other Clinician: Massie Kluver Referring Provider: Treating Provider/Extender: [name] in Treatment: 24 Subjective Chief Complaint Information obtained from Patient Multiple LE Ulcers History of Present Illness (HPI) 01-16-2022 upon evaluation today patient presents for initial inspection here in the clinic concerning issues that he has been having with wounds over the bilateral lower extremities and bilateral feet. Fortunately there does not appear to be any signs of significant infection at this point which is great news. Unfortunately his ABIs were registering at 0.49 on the left and 0.57 on the right here in the clinic on the screening today. With that being said I do think that it may possibly be true that we need to get him to formal arterial studies in order to see how things really are showing up. And the patient's not in disagreement with this for that reason we are going to make that referral as well. With that being said he does have some significant past medical history items of note [name], [name] ([id number]) [id number] Page 7 of 10 detailed below. Patient does have chronic kidney disease stage IIIb, coronary artery disease, long-term use of anticoagulant therapy, dilated cardiomyopathy. Proximal atrial fibrillation, he is on Xarelto long-term. He also has congestive heart failure, hypertension, peripheral neuropathy not related to diabetes as he is only prediabetic with a hemoglobin A1c of 5.8, and chronic venous hypertension. He has recently seen Dr. [name] show his cardiologist who did discontinue the hydrochlorothiazide and the Lasix and felt that the patient was retaining  more fluid than he should be. For that reason he was started on furosemide 20 mg twice daily. 7/27; patient with bilateral lower extremity wounds. These are largely venous although he had very poor ABIs on screening test here. He has an appointment apparently tomorrow at vein and vascular for arterial studies. He was not too keen on the latter although I think I emphasized the need to do this both in terms of dealing with the current wounds and being prepared for further issues down the road. We have been using silver alginate on the wounds and Tubigrip's edema control is better per our intake nurse 02-06-2022 upon evaluation today patient appears to be doing well with regard to his wound. Fortunately there is no signs of infection he is actually healing for the most part on the bilateral feet. His legs are also doing much better though he has 1 new area that popped up on the right leg. Fortunately there is no evidence of active infection of note his ABIs and TBI's were poor on the vascular screening on the 31st with the left being worse than the right. For that reason I am going to recommend he still continue to follow-up with vascular to discuss angioplasty/stenting. 8-124-23 upon evaluation today patient appears to be doing well currently in regard to his wound on the leg although he has several other blisters that are showing up at this point. I do believe that we need to try to do something to get his swelling under control this could be of utmost importance as far as getting this healed is concerned. 03-06-2022 upon evaluation today patient appears to be doing well currently in regard to his wounds. In fact the legs are drying up and seem  to be healing quite nicely the lateral wounds are still weeping a little bit about the medial side actually appears to be pretty much dry and closed. The plantar aspect of his foot still is draining a bit but again it seems to be doing much better which is great  news.[name] 03-13-2022 upon evaluation patient appears to be almost completely healed. His leg is doing excellent and his foot is just about closed. With that being said he did see vascular and they actually discussed the possibility of doing an arteriogram/angiogram in order to see if anything needed to be the balloons were stented open. With that being said he decided not to do that since things are healing so well right now and he is going to consider that for future they plan to see him back in November. 03-21-2022 upon evaluation today patient appears to be doing a little worse in regard to his wound on the foot. The wound keeps seeming to pull together unfortunately which is causing a trapped some fluid is just not healing quite as well as would like to see. With that being said I am going to try to remove a bit more of the overgrowth callus tissue today and hopefully that can help get this to seal up much more effectively and quickly. Fortunately I see no signs of infection of the foot although the leg on the right does seem to show some signs of erythema today I do think we need to address that currently. This looks to be cellulitis. 03-31-2022 upon evaluation today patient appears to be doing better in regard to the cellulitis I am pleased in that regard he is still taking the antibiotics. Fortunately I do not see any evidence of active infection locally or systemically at this time which is great news. No fevers, chills, nausea, vomiting, or diarrhea. 04-10-2022 upon evaluation today patient appears to be doing well currently in regard to his foot ulcer I think were very close to complete resolution which is great news. Fortunately I do not see any signs of active infection locally or systemically at this time. No fevers, chills, nausea, vomiting, or diarrhea. 04-24-2022 upon evaluation today patient appears to be doing about the same in regard to his foot ulcer. Were not really seeing much in the  way of improvements here. With that being said I do believe that he is tolerating the dressing changes without complication. No fevers, chills, nausea, vomiting, or diarrhea. With that being said the wound on his right plantar foot just does not seem to be making progress here. I do believe that he may benefit from vascular intervention which we previously discussed but he is previously wanted to hold off on. However at this point I think that this is becoming more crucial and he did ask me about getting in touch with the vascular doctor in order to see if they can get him scheduled sooner his next follow-up until like the end of December. I am more than happy to do this as I think it would be definitely beneficial for him. 05-01-2022 upon evaluation patient's wound bed actually showed signs of good granulation and epithelization at this point all things considered. Still his ABIs are very low is about 50% on each leg. With that being said he does have a follow-up with vascular in about 2 weeks maybe 2-1/2 weeks where he is going to have the angiogram for the right leg and then subsequently the left leg will be to follow. Nonetheless  I think this is can make a big difference in getting the wounds healed to be honest. Fortunately I do not see any signs of active infection at this point. 11/9; patient arrives in clinic with 2 new superficial wounds on the right leg and right ankle. Both of these and the same condition of the original wound. In terms of the wound on his plantar foot about the same. We have been using collagen. His arterial review/angiogram is due for November 21 05-15-2022 upon evaluation today patient appears to be doing well currently in regard to his wounds all things considered. He actually has an appointment upcoming with the vascular surgeon where he is going to be undergoing intervention. Obviously I think this could be beneficial as far as helping this right leg and foot to heal  it also allows to be able to put compression on which I think is much needed. He voiced understanding. That is on 21 November.. 05-29-2022 upon evaluation today patient appears to be doing significantly better in regard to his leg in general he seems to have much better blood flow compared to previous. Fortunately there does not appear to be any signs of infection locally nor systemically at this time. The good news is post his intervention procedure with Dr. Delana Meyer he only has 10% residual blockage in the right leg and to be honest this already looks much better from a color perspective I am very pleased with where we stand. 06-05-2022 upon evaluation today patient appears to be doing well currently in regard to his wounds. He has been tolerating the dressing changes the legs all appear to be healed. In regard to the foot this is measuring much smaller and doing much better and overall I am extremely pleased with where we stand currently. I do not see any signs of active infection locally or systemically at this time. 06-12-2022 upon evaluation today patient appears to be doing well in regard to his right foot ulcer which is almost completely healed I am actually very pleased with where things stand. I do not see any signs of active infection at this time. 06-19-2022 upon evaluation patient actually appears to be making excellent progress at this point. Fortunately I do not see any signs of infection locally nor systemically which is great news and overall I am extremely pleased with where we are currently. In general I think that the patient is very close to complete resolution in regard to his foot. 12/28; patient comes in with a small wound on his right plantar foot a nondiabetic neuropathic wound also in the setting with lower extremity chronic venous insufficiency and apparently he has had interventions for PAD. He was hoping that the wound would be closed this week. Has been using Prisma and a  peg assist shoe 07-04-2022 upon evaluation today patient appears to be doing well currently in regard to his foot ulcer which is actually showing signs of improvement. Unfortunately his leg is starting to open up is not wearing like he is supposed to continue. Fortunately I do not see any signs of infection which is good news but at the same time I do not think that this is doing nearly as well as what he like to see. [name], [name] ([id number]) [id number] Page 8 of 10 Objective Constitutional Well-nourished and well-hydrated in no acute distress. Vitals Time Taken: 12:23 PM, Height: 74 in, Weight: 244 lbs, BMI: 31.3, Temperature: 97.7 F, Pulse: 56 bpm, Respiratory Rate: 18 breaths/min, Blood Pressure: 107/46 mmHg.  Respiratory normal breathing without difficulty. Psychiatric this patient is able to make decisions and demonstrates good insight into disease process. Alert and Oriented x 3. pleasant and cooperative. General Notes: Upon inspection patient's wound bed actually showed signs of good granulation epithelization at this point. Fortunately I do not see any signs of infection locally nor systemically which is great news and overall very pleased in that regard. Nonetheless I do believe that we are headed in the right direction based on what we are seeing. We are attempting to wrap his leg to get the edema and swelling under control. Integumentary (Hair, Skin) Wound #2 status is Open. Original cause of wound was Gradually Appeared. The date acquired was: 12/28/2020. The wound has been in treatment 24 weeks. The wound is located on the [place]. The wound measures 0.4cm length x 0.3cm width x 0.1cm depth; 0.094cm^2 area and 0.009cm^3 volume. There is Fat Layer (Subcutaneous Tissue) exposed. There is a medium amount of serosanguineous drainage noted. There is small (1-33%) red granulation within the wound bed. There is no necrotic tissue within the wound  bed. 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 There was a Excisional Skin/Subcutaneous Tissue Debridement with a total area of 1 sq cm performed by [name]., PA-C. With the following instrument(s): Curette to remove Viable and Non-Viable tissue/material. Material removed includes Callus, Subcutaneous Tissue, and Slough. A time out was conducted at 12:42, prior to the start of the procedure. A Minimum amount of bleeding was controlled with Pressure. The procedure was tolerated well. Post Debridement Measurements: 0.4cm length x 0.3cm width x 0.1cm depth; 0.009cm^3 volume. Character of Wound/Ulcer Post Debridement is stable. Post procedure Diagnosis Wound #2: Same as Pre-Procedure Pre-procedure diagnosis of Wound #2 is a Neuropathic Ulcer-Non Diabetic located on the [place] . There was a Three Layer Compression Therapy Procedure with a pre-treatment ABI of 0.6 by Massie Kluver. Post procedure Diagnosis Wound #2: Same as Pre-Procedure Notes: Patient has had revascularization on  right lower leg. Plan Follow-up Appointments: Return Appointment in 1 week. Nurse Visit as needed [name], [name] ([id number]) [id number] Page 9 of 10 Bathing/ Shower/ Hygiene: May shower with wound dressing protected with water repellent cover or cast protector. Edema Control - Lymphedema / Segmental Compressive Device / Other: 3 Layer Compression System for Lymphedema. Elevate, Exercise Daily and Avoid Standing for Long Periods of Time. Elevate legs to the level of the heart and pump ankles as often as possible Elevate leg(s) parallel to the floor when sitting. Off-Loading: Open toe surgical shoe with peg assist. WOUND #2: - Foot Wound Laterality: Plantar, Right Cleanser: Wound Cleanser 1 x Per Week/30 Days Discharge Instructions: Wash your hands with soap and water. Remove old dressing, discard into plastic bag and place into trash. Cleanse  the wound with Wound Cleanser prior to applying a clean dressing using gauze sponges, not tissues or cotton balls. Do not scrub or use excessive force. Pat dry using gauze sponges, not tissue or cotton balls. Prim Dressing: Prisma 4.34 (in) 1 x Per Week/30 Days ary Discharge Instructions: Moisten w/normal saline or sterile water; Cover wound as directed. Do not remove from wound bed. Secondary Dressing: Coverlet Latex-Free Fabric Adhesive Dressings 1 x Per Week/30 Days Discharge Instructions: 1.5 x 2 Com pression Wrap: 3-LAYER WRAP - Profore Lite LF 3 Multilayer Compression Bandaging System 1 x Per Week/30 Days Discharge Instructions: Apply 3 multi-layer wrap as prescribed. 1 I am going to suggest that we have the patient continue to monitor for any signs of infection or worsening. Obviously if anything changes he knows contact the office let me know. 2. #1 I am going to recommend patient should continue with dressing on the left foot wound and then used Prisma followed by a coverlet. 3. We will continue with the 3  layer compression wrap for the leg I think we need to do this back to control since he has a lot of cracking and swelling noted at this point. We will see patient back for reevaluation in 1 week here in the clinic. If anything worsens or changes patient will contact our office for additional recommendations. Electronic Signature(s) Signed: 07/04/2022 12:49:29 PM By: Worthy Keeler PA-C Entered By: Worthy Keeler on 07/04/2022 12:49:29 -------------------------------------------------------------------------------- SuperBill Details Patient Name: Date of Service: [name] [facility] D. 07/04/2022 Medical Record Number: [id number] Patient Account Number: [id number] Date of Birth/Sex: Treating RN: 30-Apr-1941 (82 y.o. [name] Primary Care Provider: [name] Other Clinician: Massie Kluver Referring Provider: Treating Provider/Extender: [name] in Treatment: 24 Diagnosis Coding ICD-10 Codes Code Description [phone] Chronic venous hypertension (idiopathic) with ulcer and inflammation of bilateral lower extremity L97.812 Non-pressure chronic ulcer of other part of right lower leg with fat layer exposed L97.822 Non-pressure chronic ulcer of other part of left lower leg with fat layer exposed G90.09 Other idiopathic peripheral autonomic neuropathy L97.522 Non-pressure chronic ulcer of other part of left foot with fat layer exposed L97.512 Non-pressure chronic ulcer of other part of right foot with fat layer exposed I10 Essential (primary) hypertension I50.42 Chronic combined systolic (congestive) and diastolic (congestive) heart failure I48.0 Paroxysmal atrial fibrillation I42.0 Dilated cardiomyopathy Z79.01 Long term (current) use of anticoagulants I25.10 Atherosclerotic heart disease of native coronary artery without angina pectoris [name], Deangelo D ([id number]) [id number] Page 10 of 10 N18.32 Chronic kidney disease, stage  3b Facility Procedures : CPT4 Code: [id number] Description: 30160 - DEB SUBQ TISSUE 20 SQ CM/< ICD-10 Diagnosis Description L97.512 Non-pressure chronic ulcer of other part of right foot with fat layer exposed Modifier: Quantity: 1 Physician Procedures : CPT4 Code Description Modifier [id number] 11042 - WC PHYS SUBQ TISS 20 SQ CM ICD-10 Diagnosis Description L97.512 Non-pressure chronic ulcer of other part of right foot with fat layer exposed Quantity: 1 Electronic Signature(s) Signed: 07/04/2022 12:49:48 PM By: Worthy Keeler PA-C Entered By: Worthy Keeler on 07/04/2022 12:49:47

## 2022-07-08 ENCOUNTER — Other Ambulatory Visit: Payer: Self-pay | Admitting: Family Medicine

## 2022-07-08 ENCOUNTER — Encounter: Payer: PPO | Admitting: Physician Assistant

## 2022-07-08 DIAGNOSIS — G9009 Other idiopathic peripheral autonomic neuropathy: Secondary | ICD-10-CM | POA: Diagnosis not present

## 2022-07-08 DIAGNOSIS — L97512 Non-pressure chronic ulcer of other part of right foot with fat layer exposed: Secondary | ICD-10-CM | POA: Diagnosis not present

## 2022-07-08 DIAGNOSIS — I87333 Chronic venous hypertension (idiopathic) with ulcer and inflammation of bilateral lower extremity: Secondary | ICD-10-CM | POA: Diagnosis not present

## 2022-07-08 NOTE — Telephone Encounter (Signed)
Requested medications are due for refill today.  unsure  Requested medications are on the active medications list.  yes  Last refill.  3 weeks ago  Future visit scheduled.   no  Notes to clinic.  Medication listed as historical.    Requested Prescriptions  Pending Prescriptions Disp Refills   hydrochlorothiazide (MICROZIDE) 12.5 MG capsule [Pharmacy Med Name: HYDROCHLOROTHIAZIDE 12.5MG  CAPSULES] 90 capsule     Sig: TAKE 1 CAPSULE(12.5 MG) BY MOUTH DAILY     Cardiovascular: Diuretics - Thiazide Failed - 07/08/2022  7:07 AM      Failed - Cr in normal range and within 180 days    Creat  Date Value Ref Range Status  03/19/2017 1.71 (H) 0.70 - 1.18 mg/dL Final    Comment:    For patients >49 years of age, the reference limit for Creatinine is approximately 13% higher for people identified as African-American. .    Creatinine, Ser  Date Value Ref Range Status  05/20/2022 1.64 (H) 0.61 - 1.24 mg/dL Final         Failed - K in normal range and within 180 days    Potassium  Date Value Ref Range Status  01/07/2022 4.7 3.5 - 5.2 mmol/L Final         Failed - Na in normal range and within 180 days    Sodium  Date Value Ref Range Status  01/07/2022 140 134 - 144 mmol/L Final         Passed - Last BP in normal range    BP Readings from Last 1 Encounters:  06/11/22 (!) 115/50         Passed - Valid encounter within last 6 months    Recent Outpatient Visits           4 months ago Chronic systolic heart failure [facility])   [place], DO   6 months ago Skin breakdown   [facility] [place], [place], PA-C   9 months ago Acute bacterial sinusitis   [facility] Gwyneth Sprout, FNP   1 year ago Annual physical exam   [facility] Gwyneth Sprout, FNP   1 year ago Diverticulitis   [place], [place]

## 2022-07-08 NOTE — Progress Notes (Signed)
[name] ([id number]) [id number] Page 1 of 4 Visit Report for 07/08/2022 Chief Complaint Document Details Patient Name: Date of Service: [name] [facility] [name]. 07/08/2022 11:15 A M Medical Record Number: [id number] Patient Account Number: [id number] Date of Birth/Sex: Treating RN: 04/16/1941 (81 y.o. [name]) Carlene Coria Primary Care Provider: [name] Other Clinician: Referring Provider: Treating Provider/Extender: [name] in Treatment: 24 Information Obtained from: Patient Chief Complaint Multiple LE Ulcers Electronic Signature(s) Signed: 07/08/2022 11:19:06 AM By: Worthy Keeler PA-C Entered By: Worthy Keeler on 07/08/2022 11:19:06 -------------------------------------------------------------------------------- Debridement Details Patient Name: Date of Service: [name] [facility] [name]. 07/08/2022 11:15 A M Medical Record Number: [id number] Patient Account Number: [id number] Date of Birth/Sex: Treating RN: Oct 19, 1940 (81 y.o. [name]) Carlene Coria Primary Care Provider: [name] Other Clinician: Referring Provider: Treating Provider/Extender: [name] in Treatment: 24 Debridement Performed for Assessment: Wound #2 Right,Plantar Foot Performed By: Physician Tommie Sams., PA-C Debridement Type: Debridement Level of Consciousness (Pre-procedure): Awake and Alert Pre-procedure Verification/Time Out Yes - 11:25 Taken: Start Time: 11:25 T Area Debrided (L x W): otal 0.5 (cm) x 0.5 (cm) = 0.25 (cm) Tissue and other material debrided: Non-Viable, Callus Level: Non-Viable Tissue Debridement Description: Selective/Open Wound Instrument: Curette Bleeding: Moderate Hemostasis Achieved: Silver Nitrate End Time: 11:27 Procedural Pain: 0 Post Procedural Pain: 0 Response to Treatment: Procedure was tolerated well Level of Consciousness ([address], BORUNDA [name] ([id number]) [id number]  Page 2 of 4 Level of Consciousness (Post- Awake and Alert procedure): Post Debridement Measurements of Total Wound Length: (cm) 0.1 Width: (cm) 0.1 Depth: (cm) 0.1 Volume: (cm) 0.001 Character of Wound/Ulcer Post Debridement: Improved Post Procedure Diagnosis Same as Pre-procedure Electronic Signature(s) Unsigned Entered ByCarlene Coria on 07/08/2022 11:28:45 -------------------------------------------------------------------------------- Physician Orders Details Patient Name: Date of Service: [name] [facility] [name]. 07/08/2022 11:15 A M Medical Record Number: [id number] Patient Account Number: [id number] Date of Birth/Sex: Treating RN: February 19, 1941 (81 y.o. [name]) Carlene Coria Primary Care Provider: [name] Other Clinician: Referring Provider: Treating Provider/Extender: [name] in Treatment: 24 Verbal / Phone Orders: No Diagnosis Coding ICD-10 Coding Code Description [id number] Chronic venous hypertension (idiopathic) with ulcer and inflammation of bilateral lower extremity L97.812 Non-pressure chronic ulcer of other part of right lower leg with fat layer exposed L97.822 Non-pressure chronic ulcer of other part of left lower leg with fat layer exposed G90.09 Other idiopathic peripheral autonomic neuropathy L97.522 Non-pressure chronic ulcer of other part of left foot with fat layer exposed L97.512 Non-pressure chronic ulcer of other part of right foot with fat layer exposed I10 Essential (primary) hypertension I50.42 Chronic combined systolic (congestive) and diastolic (congestive) heart failure I48.0 Paroxysmal atrial fibrillation I42.0 Dilated cardiomyopathy Z79.01 Long term (current) use of anticoagulants I25.10 Atherosclerotic heart disease of native coronary artery without angina pectoris N18.32 Chronic kidney disease, stage 3b Follow-up Appointments Return Appointment in 1 week. Nurse Visit as needed Bathing/ Shower/ Hygiene May shower with wound  dressing protected with water repellent cover or cast protector. Anesthetic (Use 'Patient Medications' Section for Anesthetic Order Entry) Lidocaine applied to wound bed [name] ([id number]) [id number] Page 3 of 4 Edema Control - Lymphedema / Segmental Compressive Device / Other Optional: One layer of unna paste to top of compression wrap (to act as an anchor). 3 Layer Compression System for Lymphedema. - RIGHT LOWER LEG Elevate, Exercise Daily and A void Standing for Long Periods of Time. Elevate legs to the level of the heart and  pump ankles as often as possible Elevate leg(s) parallel to the floor when sitting. Off-Loading Right Lower Extremity Open toe surgical shoe with peg assist. Wound Treatment Wound #2 - Foot Wound Laterality: Plantar, Right Cleanser: Wound Cleanser 1 x Per Week/30 Days Discharge Instructions: Wash your hands with soap and water. Remove old dressing, discard into plastic bag and place into trash. Cleanse the wound with Wound Cleanser prior to applying a clean dressing using gauze sponges, not tissues or cotton balls. Do not scrub or use excessive force. Pat dry using gauze sponges, not tissue or cotton balls. Prim Dressing: Silvercel Small 2x2 (in/in) 1 x Per Week/30 Days ary Discharge Instructions: Apply Silvercel Small 2x2 (in/in) as instructed Secondary Dressing: Coverlet Latex-Free Fabric Adhesive Dressings 1 x Per Week/30 Days Discharge Instructions: 1.5 x 2 Electronic Signature(s) Unsigned Entered ByCarlene Coria on 07/08/2022 11:32:09 -------------------------------------------------------------------------------- Problem List Details Patient Name: Date of Service: [name] [facility] [name]. 07/08/2022 11:15 A M Medical Record Number: [id number] Patient Account Number: [id number] Date of Birth/Sex: Treating RN: 1940-12-20 (81 y.o. [name]) Carlene Coria Primary Care Provider: [name] Other Clinician: Referring  Provider: Treating Provider/Extender: [name] in Treatment: 24 Active Problems ICD-10 Encounter Code Description Active Date MDM Diagnosis I87.333 Chronic venous hypertension (idiopathic) with ulcer and inflammation of 01/16/2022 No Yes bilateral lower extremity L97.812 Non-pressure chronic ulcer of other part of right lower leg with fat layer 01/16/2022 No Yes exposed L97.822 Non-pressure chronic ulcer of other part of left lower leg with fat layer exposed 01/16/2022 No Yes G90.09 Other idiopathic peripheral autonomic neuropathy 01/16/2022 No Yes Patella, Adrin [name] ([id number]) [id number] Page 4 of 4 [phone] Non-pressure chronic ulcer of other part of left foot with fat layer exposed 01/16/2022 No Yes L97.512 Non-pressure chronic ulcer of other part of right foot with fat layer exposed 01/16/2022 No Yes I10 Essential (primary) hypertension 01/16/2022 No Yes I50.42 Chronic combined systolic (congestive) and diastolic (congestive) heart failure 01/16/2022 No Yes I48.0 Paroxysmal atrial fibrillation 01/16/2022 No Yes I42.0 Dilated cardiomyopathy 01/16/2022 No Yes Z79.01 Long term (current) use of anticoagulants 01/16/2022 No Yes I25.10 Atherosclerotic heart disease of native coronary artery without angina pectoris 01/16/2022 No Yes N18.32 Chronic kidney disease, stage 3b 01/16/2022 No Yes Inactive Problems Resolved Problems Electronic Signature(s) Signed: 07/08/2022 11:19:03 AM By: Worthy Keeler PA-C Entered By: Worthy Keeler on 07/08/2022 11:19:03

## 2022-07-10 ENCOUNTER — Telehealth (INDEPENDENT_AMBULATORY_CARE_PROVIDER_SITE_OTHER): Payer: Self-pay

## 2022-07-10 NOTE — Telephone Encounter (Signed)
Spoke with the patient and he is scheduled with Dr. Delana Meyer on 07/15/22 with a 10:00 am [facility]. Pre-procedure instructions were discussed and will be mailed.

## 2022-07-10 NOTE — Progress Notes (Signed)
DRESHAUN, STENE D ([id number]) [id number] Page 1 of 8 Visit Report for 07/08/2022 Arrival Information Details Patient Name: Date of Service: [name] [facility] D. 07/08/2022 11:15 A M Medical Record Number: [id number] Patient Account Number: [id number] Date of Birth/Sex: Treating RN: 10/06/1940 (82 y.o. [name]) Carlene Coria Primary Care Kieanna Rollo: [name] Other Clinician: Referring Quinisha Mould: Treating Matthew Pais/Extender: [name] in Treatment: 24 Visit Information History Since Last Visit Added or deleted any medications: No Patient Arrived: [name] Any new allergies or adverse reactions: No Arrival Time: 11:07 Had a fall or experienced change in No Accompanied By: self activities of daily living that may affect Transfer Assistance: None risk of falls: Patient Identification Verified: Yes Signs or symptoms of abuse/neglect since last visito No Secondary Verification Process Completed: Yes Hospitalized since last visit: No Patient Requires Transmission-Based Precautions: No Implantable device outside of the clinic excluding No Patient Has Alerts: Yes cellular tissue based products placed in the center Patient Alerts: ABI R 0.69 L 0.66 since last visit: TBI R 0.41 L 0.16 Has Dressing in Place as Prescribed: Yes Has Compression in Place as Prescribed: Yes Pain Present Now: No Electronic Signature(s) Signed: 07/10/2022 3:13:07 PM By: Carlene Coria RN Entered By: Carlene Coria on 07/08/2022 11:12:15 -------------------------------------------------------------------------------- Clinic Level of Care Assessment Details Patient Name: Date of Service: [name] [facility] D. 07/08/2022 11:15 A M Medical Record Number: [id number] Patient Account Number: [id number] Date of Birth/Sex: Treating RN: 1941-04-29 (82 y.o. [name]) Carlene Coria Primary Care Nimesh Riolo: [name] Other Clinician: Referring Zollie Ellery: Treating Nyeisha Goodall/Extender: [name] in Treatment: 24 Clinic Level of Care Assessment Items TOOL 1 Quantity Score []  - 0 Use when EandM and Procedure is performed on INITIAL visit ASSESSMENTS - Nursing Assessment / Reassessment []  - 0 General Physical Exam (combine w/ comprehensive assessment (listed just below) when performed on new pt. evals) []  - 0 Comprehensive Assessment (HX, ROS, Risk Assessments, Wounds Hx, etc.) Wien, Matthewjames D ([id number]) [phone].pdf Page 2 of 8 ASSESSMENTS - Wound and Skin Assessment / Reassessment []  - 0 Dermatologic / Skin Assessment (not related to wound area) ASSESSMENTS - Ostomy and/or Continence Assessment and Care []  - 0 Incontinence Assessment and Management []  - 0 Ostomy Care Assessment and Management (repouching, etc.) PROCESS - Coordination of Care []  - 0 Simple Patient / Family Education for ongoing care []  - 0 Complex (extensive) Patient / Family Education for ongoing care []  - 0 Staff obtains Programmer, systems, Records, T Results / Process Orders est []  - 0 Staff telephones HHA, Nursing Homes / Clarify orders / etc []  - 0 Routine Transfer to another Facility (non-emergent condition) []  - 0 Routine Hospital Admission (non-emergent condition) []  - 0 New Admissions / Biomedical engineer / Ordering NPWT Apligraf, etc. , []  - 0 Emergency Hospital Admission (emergent condition) PROCESS - Special Needs []  - 0 Pediatric / Minor Patient Management []  - 0 Isolation Patient Management []  - 0 Hearing / Language / Visual special needs []  - 0 Assessment of Community assistance (transportation, D/C planning, etc.) []  - 0 Additional assistance / Altered mentation []  - 0 Support Surface(s) Assessment (bed, cushion, seat, etc.) INTERVENTIONS - Miscellaneous []  - 0 External ear exam []  - 0 Patient Transfer (multiple staff / Civil Service fast streamer / Similar devices) []  - 0 Simple Staple / Suture removal (25 or less) []  - 0 Complex Staple / Suture  removal (26 or more) []  - 0 Hypo/Hyperglycemic Management (do not check if billed separately) []  - 0 Ankle /  Brachial Index (ABI) - do not check if billed separately Has the patient been seen at the hospital within the last three years: Yes Total Score: 0 Level Of Care: ____ Electronic Signature(s) Signed: 07/10/2022 3:13:07 PM By: Carlene Coria RN Entered By: Carlene Coria on 07/08/2022 12:29:00 -------------------------------------------------------------------------------- Encounter Discharge Information Details Patient Name: Date of Service: [name] [facility] D. 07/08/2022 11:15 A M Medical Record Number: [id number] Patient Account Number: [id number] Date of Birth/Sex: Treating RN: May 12, 1941 (82 y.o. Oval Linsey Primary Care Sharonica Kraszewski: [name] Other Clinician: Referring Manon Banbury: Treating Allan Bacigalupi/Extender: [name] Bl[place], [place] D ([id number]) [phone].pdf Page 3 of 8 Weeks in Treatment: 24 Encounter Discharge Information Items Post Procedure Vitals Discharge Condition: Stable Temperature (F): 97.8 Ambulatory Status: Walker Pulse (bpm): 68 Discharge Destination: Home Respiratory Rate (breaths/min): 18 Transportation: Private Auto Blood Pressure (mmHg): 109/57 Accompanied By: self Schedule Follow-up Appointment: Yes Clinical Summary of Care: Electronic Signature(s) Signed: 07/08/2022 12:31:39 PM By: Carlene Coria RN Entered By: Carlene Coria on 07/08/2022 12:31:39 -------------------------------------------------------------------------------- Lower Extremity Assessment Details Patient Name: Date of Service: [name] [facility] D. 07/08/2022 11:15 A M Medical Record Number: [id number] Patient Account Number: [id number] Date of Birth/Sex: Treating RN: [birth year] (82 y.o. [name]) Carlene Coria Primary Care Rosamond Andress: [name] Other Clinician: Referring Eryn Krejci: Treating Landi Biscardi/Extender: [name] Weeks in Treatment:  24 Vascular Assessment Pulses: Dorsalis Pedis Palpable: [Left:Yes] Electronic Signature(s) Signed: 07/10/2022 3:13:07 PM By: Carlene Coria RN Entered By: Carlene Coria on 07/08/2022 11:17:52 -------------------------------------------------------------------------------- Multi Wound Chart Details Patient Name: Date of Service: [name] [facility] D. 07/08/2022 11:15 A M Medical Record Number: [id number] Patient Account Number: [id number] Date of Birth/Sex: Treating RN: December 01, 1940 (81 y.o. Oval Linsey Primary Care Zakhi Dupre: [name] Other Clinician: Referring Karalyne Nusser: Treating Nioka Thorington/Extender: [name] Weeks in Treatment: 24 Vital Signs Height(in): 74 Pulse(bpm): [place], [place] D ([id number]) [phone].pdf Page 4 of 8 Weight(lbs): 244 Blood Pressure(mmHg): 109/57 Body Mass Index(BMI): 31.3 Temperature(F): 97.8 Respiratory Rate(breaths/min): 18 [2:Photos:] [N/A:N/A] Right, Plantar Foot N/A N/A Wound Location: Gradually Appeared N/A N/A Wounding Event: Neuropathic Ulcer-Non Diabetic N/A N/A Primary Etiology: Congestive Heart Failure, N/A N/A Comorbid History: Hypertension 12/28/2020 N/A N/A Date Acquired: 24 N/A N/A Weeks of Treatment: Open N/A N/A Wound Status: No N/A N/A Wound Recurrence: 0.1x0.1x0.1 N/A N/A Measurements L x W x D (cm) 0.008 N/A N/A A (cm) : rea 0.001 N/A N/A Volume (cm) : 89.90% N/A N/A % Reduction in Area: 95.80% N/A N/A % Reduction in Volume: Full Thickness Without Exposed N/A N/A Classification: Support Structures Medium N/A N/A Exudate Amount: Serosanguineous N/A N/A Exudate Type: red, brown N/A N/A Exudate Color: Small (1-33%) N/A N/A Granulation Amount: Red N/A N/A Granulation Quality: None Present (0%) N/A N/A Necrotic Amount: Fat Layer (Subcutaneous Tissue): Yes N/A N/A Exposed Structures: Fascia: No Tendon: No Muscle: No Joint: No Bone: No Medium (34-66%) N/A  N/A Epithelialization: Treatment Notes Electronic Signature(s) Signed: 07/10/2022 3:13:07 PM By: Carlene Coria RN Entered By: Carlene Coria on 07/08/2022 11:17:57 -------------------------------------------------------------------------------- Multi-Disciplinary Care Plan Details Patient Name: Date of Service: [name] [facility] D. 07/08/2022 11:15 A M Medical Record Number: [id number] Patient Account Number: [id number] Date of Birth/Sex: Treating RN: 24-Dec-1940 (81 y.o. Oval Linsey Primary Care Charlee Whitebread: [name] Other Clinician: Referring Todrick Siedschlag: Treating Fuquan Wilson/Extender: [name] in Treatment: [address] HUNT, ZAJICEK D ([id number]) [id number] Page 5 of 8 Wound/Skin Impairment Nursing Diagnoses: Knowledge deficit related to ulceration/compromised skin integrity Goals: Patient/caregiver will  verbalize understanding of skin care regimen Date Initiated: 01/16/2022 Target Resolution Date: 08/08/2022 Goal Status: Active Ulcer/skin breakdown will have a volume reduction of 30% by week 4 Date Initiated: 01/16/2022 Date Inactivated: 07/08/2022 Target Resolution Date: 02/16/2022 Goal Status: Unmet Unmet Reason: comorbidities Ulcer/skin breakdown will have a volume reduction of 50% by week 8 Date Initiated: 01/16/2022 Date Inactivated: 07/08/2022 Target Resolution Date: 03/19/2022 Goal Status: Unmet Unmet Reason: comorbidities Ulcer/skin breakdown will have a volume reduction of 80% by week 12 Date Initiated: 01/16/2022 Date Inactivated: 07/08/2022 Target Resolution Date: 04/18/2022 Goal Status: Unmet Unmet Reason: comorbidities Ulcer/skin breakdown will heal within 14 weeks Date Initiated: 01/16/2022 Date Inactivated: 07/08/2022 Target Resolution Date: 05/19/2022 Goal Status: Unmet Unmet Reason: comorbidities Interventions: Assess patient/caregiver ability to obtain necessary supplies Assess patient/caregiver ability to  perform ulcer/skin care regimen upon admission and as needed Assess ulceration(s) every visit Notes: Electronic Signature(s) Signed: 07/08/2022 12:30:44 PM By: Carlene Coria RN Entered By: Carlene Coria on 07/08/2022 12:30:43 -------------------------------------------------------------------------------- Pain Assessment Details Patient Name: Date of Service: [name] [facility] D. 07/08/2022 11:15 A M Medical Record Number: [id number] Patient Account Number: [id number] Date of Birth/Sex: Treating RN: 1941/04/06 (81 y.o. Oval Linsey Primary Care Jonnelle Lawniczak: [name] Other Clinician: Referring Jessen Siegman: Treating Pankaj Haack/Extender: [name] in Treatment: 24 Active Problems Location of Pain Severity and Description of Pain Patient Has Paino No Site Locations DELFIN, SQUILLACE D ([id number]) [id number] Page 6 of 8 Pain Management and Medication Current Pain Management: Electronic Signature(s) Signed: 07/10/2022 3:13:07 PM By: Carlene Coria RN Entered By: Carlene Coria on 07/08/2022 11:12:43 -------------------------------------------------------------------------------- Patient/Caregiver Education Details Patient Name: Date of Service: [name] [facility] D. 1/9/2024andnbsp11:15 A M Medical Record Number: [id number] Patient Account Number: [id number] Date of Birth/Gender: Treating RN: 04/15/1941 (81 y.o. Oval Linsey Primary Care Physician: [name] Other Clinician: Referring Physician: Treating Physician/Extender: [name] in Treatment: 24 Education Assessment Education Provided To: Patient Education Topics Provided Wound/Skin Impairment: Methods: Explain/Verbal Responses: State content correctly Electronic Signature(s) Signed: 07/10/2022 3:13:07 PM By: Carlene Coria RN Entered By: Carlene Coria on 07/08/2022 12:29:17 [name] ([id number]) [id number] Page 7 of  8 -------------------------------------------------------------------------------- Wound Assessment Details Patient Name: Date of Service: [name] [facility] D. 07/08/2022 11:15 A M Medical Record Number: [id number] Patient Account Number: [id number] Date of Birth/Sex: Treating RN: 1941/03/13 (81 y.o. [name]) Carlene Coria Primary Care Nasean Zapf: [name] Other Clinician: Referring Nashia Remus: Treating Francois Elk/Extender: [name] in Treatment: 24 Wound Status Wound Number: 2 Primary Etiology: Neuropathic Ulcer-Non Diabetic Wound Location: Right, Plantar Foot Wound Status: Open Wounding Event: Gradually Appeared Comorbid History: Congestive Heart Failure, Hypertension Date Acquired: 12/28/2020 Weeks Of Treatment: 24 Clustered Wound: No Photos Wound Measurements Length: (cm) 0.1 Width: (cm) 0.1 Depth: (cm) 0.1 Area: (cm) 0.008 Volume: (cm) 0.001 % Reduction in Area: 89.9% % Reduction in Volume: 95.8% Epithelialization: Medium (34-66%) Tunneling: No Undermining: No Wound Description Classification: Full Thickness Without Exposed Support Structures Exudate Amount: Medium Exudate Type: Serosanguineous Exudate Color: red, brown Foul Odor After Cleansing: No Slough/Fibrino No Wound Bed Granulation Amount: Small (1-33%) Exposed Structure Granulation Quality: Red Fascia Exposed: No Necrotic Amount: None Present (0%) Fat Layer (Subcutaneous Tissue) Exposed: Yes Tendon Exposed: No Muscle Exposed: No Joint Exposed: No Bone Exposed: No Treatment Notes Wound #2 (Foot) Wound Laterality: Plantar, Right Cleanser Wound Cleanser Discharge Instruction: Wash your hands with soap and water. Remove old dressing, discard into plastic bag and place into trash. Cleanse the wound with Wound  Cleanser prior to applying a clean dressing using gauze sponges, not tissues or cotton balls. Do not scrub or use excessive force. Pat dry using gauze sponges, not tissue or cotton  balls. POSEIDON, PAM D ([id number]) [id number] Page 8 of 8 Peri-Wound Care Topical Primary Dressing Silvercel Small 2x2 (in/in) Discharge Instruction: Apply Silvercel Small 2x2 (in/in) as instructed Secondary Dressing Coverlet Latex-Free Fabric Adhesive Dressings Discharge Instruction: 1.5 x 2 Secured With Compression Wrap Compression Stockings Add-Ons Electronic Signature(s) Signed: 07/10/2022 3:13:07 PM By: Carlene Coria RN Entered By: Carlene Coria on 07/08/2022 11:17:36 -------------------------------------------------------------------------------- Vitals Details Patient Name: Date of Service: [name], RA [facility] D. 07/08/2022 11:15 A M Medical Record Number: [id number] Patient Account Number: [id number] Date of Birth/Sex: Treating RN: 05/01/1941 (81 y.o. [name]) Carlene Coria Primary Care Waymond Meador: [name] Other Clinician: Referring Jashley Yellin: Treating Naresh Althaus/Extender: [name] in Treatment: 24 Vital Signs Time Taken: 11:12 Temperature (F): 97.8 Height (in): 74 Pulse (bpm): 68 Weight (lbs): 244 Respiratory Rate (breaths/min): 18 Body Mass Index (BMI): 31.3 Blood Pressure (mmHg): 109/57 Reference Range: 80 - 120 mg / dl Electronic Signature(s) Signed: 07/10/2022 3:13:07 PM By: Carlene Coria RN Entered By: Carlene Coria on 07/08/2022 11:12:36

## 2022-07-14 ENCOUNTER — Encounter: Payer: PPO | Admitting: Physician Assistant

## 2022-07-14 DIAGNOSIS — L97512 Non-pressure chronic ulcer of other part of right foot with fat layer exposed: Secondary | ICD-10-CM | POA: Diagnosis not present

## 2022-07-14 DIAGNOSIS — I87333 Chronic venous hypertension (idiopathic) with ulcer and inflammation of bilateral lower extremity: Secondary | ICD-10-CM | POA: Diagnosis not present

## 2022-07-14 DIAGNOSIS — E11621 Type 2 diabetes mellitus with foot ulcer: Secondary | ICD-10-CM | POA: Diagnosis not present

## 2022-07-14 NOTE — Progress Notes (Addendum)
[name], [name] ([id number]) [id number] Page 1 of 8 Visit Report for 07/14/2022 Arrival Information Details Patient Name: Date of Service: [name] [facility] D. 07/14/2022 3:30 PM Medical Record Number: [id number] Patient Account Number: [id number] Date of Birth/Sex: Treating RN: 10-30-1940 (82 y.o. [name], [name] Primary Care Beretta Ginsberg: [name] Other Clinician: Massie Kluver Referring Vennie Salsbury: Treating Bellah Alia/Extender: [name] in Treatment: 25 Visit Information History Since Last Visit All ordered tests and consults were completed: No Patient Arrived: [name] Added or deleted any medications: No Arrival Time: 16:12 Any new allergies or adverse reactions: No Transfer Assistance: None Had a fall or experienced change in No Patient Identification Verified: Yes activities of daily living that may affect Secondary Verification Process Completed: Yes risk of falls: Patient Requires Transmission-Based Precautions: No Signs or symptoms of abuse/neglect since last visito No Patient Has Alerts: Yes Hospitalized since last visit: No Patient Alerts: ABI R 0.69 L 0.66 Implantable device outside of the clinic excluding No TBI R 0.41 L 0.16 cellular tissue based products placed in the center since last visit: Has Dressing in Place as Prescribed: Yes Pain Present Now: No Electronic Signature(s) Signed: 07/18/2022 9:50:53 AM By: Massie Kluver Entered By: Massie Kluver on 07/14/2022 16:13:06 -------------------------------------------------------------------------------- Clinic Level of Care Assessment Details Patient Name: Date of Service: [name] [facility] D. 07/14/2022 3:30 PM Medical Record Number: [id number] Patient Account Number: [id number] Date of Birth/Sex: Treating RN: June 16, 1941 (82 y.o. [name] Primary Care Charleigh Correnti: [name] Other Clinician: Massie Kluver Referring [name]: Treating [name]/Extender: [name] in Treatment: 25 Clinic Level of Care Assessment Items TOOL 4 Quantity Score []  - 0 Use when only an EandM is performed on FOLLOW-UP visit ASSESSMENTS - Nursing Assessment / Reassessment X- 1 10 Reassessment of Co-morbidities (includes updates in patient status) X- 1 5 Reassessment of Adherence to Treatment Plan [name], [name] D ([id number]) [id number] Page 2 of 8 ASSESSMENTS - Wound and Skin A ssessment / Reassessment X - Simple Wound Assessment / Reassessment - one wound 1 5 []  - 0 Complex Wound Assessment / Reassessment - multiple wounds []  - 0 Dermatologic / Skin Assessment (not related to wound area) ASSESSMENTS - Focused Assessment []  - 0 Circumferential Edema Measurements - multi extremities []  - 0 Nutritional Assessment / Counseling / Intervention []  - 0 Lower Extremity Assessment (monofilament, tuning fork, pulses) []  - 0 Peripheral Arterial Disease Assessment (using hand held doppler) ASSESSMENTS - Ostomy and/or Continence Assessment and Care []  - 0 Incontinence Assessment and Management []  - 0 Ostomy Care Assessment and Management (repouching, etc.) PROCESS - Coordination of Care X - Simple Patient / Family Education for ongoing care 1 15 []  - 0 Complex (extensive) Patient / Family Education for ongoing care []  - 0 Staff obtains Programmer, systems, Records, T Results / Process Orders est []  - 0 Staff telephones HHA, Nursing Homes / Clarify orders / etc []  - 0 Routine Transfer to another Facility (non-emergent condition) []  - 0 Routine Hospital Admission (non-emergent condition) []  - 0 New Admissions / Biomedical engineer / Ordering NPWT Apligraf, etc. , []  - 0 Emergency Hospital Admission (emergent condition) X- 1 10 Simple Discharge Coordination []  - 0 Complex (extensive) Discharge Coordination PROCESS - Special Needs []  - 0 Pediatric / Minor Patient Management []  - 0 Isolation Patient Management []   - 0 Hearing / Language / Visual special needs []  - 0 Assessment of Community assistance (transportation, D/C planning, etc.) []  - 0 Additional assistance / Altered mentation []  -  0 Support Surface(s) Assessment (bed, cushion, seat, etc.) INTERVENTIONS - Wound Cleansing / Measurement X - Simple Wound Cleansing - one wound 1 5 []  - 0 Complex Wound Cleansing - multiple wounds X- 1 5 Wound Imaging (photographs - any number of wounds) []  - 0 Wound Tracing (instead of photographs) X- 1 5 Simple Wound Measurement - one wound []  - 0 Complex Wound Measurement - multiple wounds INTERVENTIONS - Wound Dressings X - Small Wound Dressing one or multiple wounds 1 10 []  - 0 Medium Wound Dressing one or multiple wounds []  - 0 Large Wound Dressing one or multiple wounds X- 1 5 Application of Medications - topical []  - 0 Application of Medications - injection INTERVENTIONS - Miscellaneous []  - 0 External ear exam [name], [name] D ([id number]) [id number] Page 3 of 8 []  - 0 Specimen Collection (cultures, biopsies, blood, body fluids, etc.) []  - 0 Specimen(s) / Culture(s) sent or taken to Lab for analysis []  - 0 Patient Transfer (multiple staff / Harrel Lemon Lift / Similar devices) []  - 0 Simple Staple / Suture removal (25 or less) []  - 0 Complex Staple / Suture removal (26 or more) []  - 0 Hypo / Hyperglycemic Management (close monitor of Blood Glucose) []  - 0 Ankle / Brachial Index (ABI) - do not check if billed separately X- 1 5 Vital Signs Has the patient been seen at the hospital within the last three years: Yes Total Score: 80 Level Of Care: New/Established - Level 3 Electronic Signature(s) Signed: 07/18/2022 9:50:53 AM By: Massie Kluver Entered By: Massie Kluver on 07/14/2022 16:38:42 -------------------------------------------------------------------------------- Encounter Discharge Information Details Patient Name: Date of Service: [name], RA LPH  D. 07/14/2022 3:30 PM Medical Record Number: [id number] Patient Account Number: [id number] Date of Birth/Sex: Treating RN: Dec 08, 1940 (82 y.o. [name], [name] Primary Care Natania Finigan: [name] Other Clinician: Massie Kluver Referring Hazim Treadway: Treating Saja Bartolini/Extender: [name] in Treatment: 25 Encounter Discharge Information Items Discharge Condition: Stable Ambulatory Status: Walker Discharge Destination: Home Transportation: Private Auto Accompanied By: self Schedule Follow-up Appointment: No Clinical Summary of Care: Electronic Signature(s) Signed: 07/18/2022 9:50:53 AM By: Massie Kluver Entered By: Massie Kluver on 07/14/2022 16:56:41 Lower Extremity Assessment Details -------------------------------------------------------------------------------- [name] ([id number]) [id number] Page 4 of 8 Patient Name: Date of Service: [name] [facility] D. 07/14/2022 3:30 PM Medical Record Number: [id number] Patient Account Number: [id number] Date of Birth/Sex: Treating RN: 1941/04/06 (82 y.o. [name], [name] Primary Care Tynell Winchell: [name] Other Clinician: Massie Kluver Referring Jhaden Pizzuto: Treating Thomas Rhude/Extender: [name] in Treatment: 25 Edema Assessment Left: Right: Assessed: No Yes Edema: Yes Calf Left: Right: Point of Measurement: 10 cm From Medial Instep 37.8 cm Ankle Left: Right: Point of Measurement: 34 cm From Medial Instep 26.9 cm Vascular Assessment Left: Right: Pulses: Dorsalis Pedis Palpable: Yes Electronic Signature(s) Signed: 07/14/2022 6:16:56 PM By: Gretta Cool, BSN, RN, CWS, Kim RN, BSN Signed: 07/18/2022 9:50:53 AM By: Massie Kluver Entered By: Massie Kluver on 07/14/2022 16:28:23 -------------------------------------------------------------------------------- Multi Wound Chart Details Patient Name: Date of Service: [name] [facility] D. 07/14/2022 3:30 PM Medical Record  Number: [id number] Patient Account Number: [id number] Date of Birth/Sex: Treating RN: 17-Jan-1941 (82 y.o. [name] Primary Care Anis Degidio: [name] Other Clinician: Massie Kluver Referring Ezra Marquess: Treating Jader Desai/Extender: [name] in Treatment: 25 Vital Signs Height(in): 74 Pulse(bpm): 59 Weight(lbs): 244 Blood Pressure(mmHg): 114/61 Body Mass Index(BMI): 31.3 Temperature(F): 98.1 Respiratory Rate(breaths/min): 18 [2:Photos:] [N/A:N/A] Right, Plantar Foot N/A N/A Wound Location: Hajduk,  Zaedyn D ([id number]) [id number] Page 5 of 8 Gradually Appeared N/A N/A Wounding Event: Neuropathic Ulcer-Non Diabetic N/A N/A Primary Etiology: Congestive Heart Failure, N/A N/A Comorbid History: Hypertension 12/28/2020 N/A N/A Date Acquired: 25 N/A N/A Weeks of Treatment: Healed - Epithelialized N/A N/A Wound Status: No N/A N/A Wound Recurrence: 0x0x0 N/A N/A Measurements L x W x D (cm) 0 N/A N/A A (cm) : rea 0 N/A N/A Volume (cm) : 100.00% N/A N/A % Reduction in Area: 100.00% N/A N/A % Reduction in Volume: Full Thickness Without Exposed N/A N/A Classification: Support Structures None Present N/A N/A Exudate Amount: None Present (0%) N/A N/A Granulation Amount: None Present (0%) N/A N/A Necrotic Amount: Fascia: No N/A N/A Exposed Structures: Fat Layer (Subcutaneous Tissue): No Tendon: No Muscle: No Joint: No Bone: No Large (67-100%) N/A N/A Epithelialization: Treatment Notes Electronic Signature(s) Signed: 07/18/2022 9:50:53 AM By: Massie Kluver Entered By: Massie Kluver on 07/14/2022 16:35:28 -------------------------------------------------------------------------------- Multi-Disciplinary Care Plan Details Patient Name: Date of Service: [name], RA LPH D. 07/14/2022 3:30 PM Medical Record Number: [id number] Patient Account Number: [id number] Date of Birth/Sex: Treating RN: 09/23/1940 (82 y.o.  [name], [name] Primary Care Maryclare Nydam: [name] Other Clinician: Massie Kluver Referring Jermale Crass: Treating Cecilia Nishikawa/Extender: [name] in Treatment: 25 Active Inactive Electronic Signature(s) Signed: 07/14/2022 6:16:56 PM By: Gretta Cool BSN, RN, CWS, Kim RN, BSN Signed: 07/18/2022 9:50:53 AM By: Massie Kluver Entered By: Massie Kluver on 07/14/2022 16:55:53 Wiberg, Adolfo D ([id number]) [id number] Page 6 of 8 -------------------------------------------------------------------------------- Pain Assessment Details Patient Name: Date of Service: [name] [facility] D. 07/14/2022 3:30 PM Medical Record Number: [id number] Patient Account Number: [id number] Date of Birth/Sex: Treating RN: [birth year] (82 y.o. [name], [name] Primary Care Rielly Corlett: [name] Other Clinician: Massie Kluver Referring Lord Lancour: Treating Lareen Mullings/Extender: [name] in Treatment: 25 Active Problems Location of Pain Severity and Description of Pain Patient Has Paino No Site Locations Pain Management and Medication Current Pain Management: Electronic Signature(s) Signed: 07/14/2022 6:16:56 PM By: Gretta Cool, BSN, RN, CWS, Kim RN, BSN Signed: 07/18/2022 9:50:53 AM By: Massie Kluver Entered By: Massie Kluver on 07/14/2022 16:16:55 -------------------------------------------------------------------------------- Patient/Caregiver Education Details Patient Name: Date of Service: [name] [facility] D. 1/15/2024andnbsp3:30 PM Medical Record Number: [id number] Patient Account Number: [id number] Date of Birth/Gender: Treating RN: [birth year] (82 y.o. [name] Primary Care Physician: [name] Other Clinician: Massie Kluver Referring Physician: Treating Physician/Extender: [name] in Treatment: [address], [place] ([id number]) [id number] Page 7 of 8 Education Assessment Education Provided  To: Patient Education Topics Provided Wound/Skin Impairment: Handouts: Other: Your wound has healed. Please call if any further issues arise Methods: Explain/Verbal Responses: State content correctly Electronic Signature(s) Signed: 07/18/2022 9:50:53 AM By: Massie Kluver Entered By: Massie Kluver on 07/14/2022 16:40:01 -------------------------------------------------------------------------------- Wound Assessment Details Patient Name: Date of Service: [name] [facility] D. 07/14/2022 3:30 PM Medical Record Number: [id number] Patient Account Number: [id number] Date of Birth/Sex: Treating RN: 1941/04/20 (82 y.o. [name], [name] Primary Care Aiden Rao: [name] Other Clinician: Massie Kluver Referring Jack Mineau: Treating Heydy Montilla/Extender: [name] in Treatment: 25 Wound Status Wound Number: 2 Primary Etiology: Neuropathic Ulcer-Non Diabetic Wound Location: Right, Plantar Foot Wound Status: Healed - Epithelialized Wounding Event: Gradually Appeared Comorbid History: Congestive Heart Failure, Hypertension Date Acquired: 12/28/2020 Weeks Of Treatment: 25 Clustered Wound: No Photos Wound Measurements Length: (cm) Width: (cm) Depth: (cm) Area: (cm) Volume: (cm) 0 % Reduction in Area: 100% 0 % Reduction in Volume: 100%  0 Epithelialization: Large (67-100%) 0 0 Wound Description Classification: Full Thickness Without Exposed Support Structures Exudate Amount: None Present Morro, Saketh D ([id number]) Foul Odor After Cleansing: No Slough/Fibrino No [id number].pdf Page 8 of 8 Wound Bed Granulation Amount: None Present (0%) Exposed Structure Necrotic Amount: None Present (0%) Fascia Exposed: No Fat Layer (Subcutaneous Tissue) Exposed: No Tendon Exposed: No Muscle Exposed: No Joint Exposed: No Bone Exposed: No Treatment Notes Wound #2 (Foot) Wound Laterality: Plantar, Right Cleanser Peri-Wound Care Topical Primary  Dressing Secondary Dressing Secured With Compression Wrap Compression Stockings Add-Ons Electronic Signature(s) Signed: 07/14/2022 6:16:56 PM By: Gretta Cool, BSN, RN, CWS, Kim RN, BSN Signed: 07/18/2022 9:50:53 AM By: Massie Kluver Entered By: Massie Kluver on 07/14/2022 16:35:16 -------------------------------------------------------------------------------- Vitals Details Patient Name: Date of Service: [name], RA LPH D. 07/14/2022 3:30 PM Medical Record Number: [id number] Patient Account Number: [id number] Date of Birth/Sex: Treating RN: [birth year] (82 y.o. [name], [name] Primary Care Nikyla Navedo: [name] Other Clinician: Massie Kluver Referring Laelia Angelo: Treating Kelbi Renstrom/Extender: [name] in Treatment: 25 Vital Signs Time Taken: 16:16 Temperature (F): 98.1 Height (in): 74 Pulse (bpm): 59 Weight (lbs): 244 Respiratory Rate (breaths/min): 18 Body Mass Index (BMI): 31.3 Blood Pressure (mmHg): 114/61 Reference Range: 80 - 120 mg / dl Electronic Signature(s) Signed: 07/18/2022 9:50:53 AM By: Massie Kluver Entered By: Massie Kluver on 07/14/2022 16:16:50

## 2022-07-14 NOTE — Progress Notes (Addendum)
STIVEN, KASPAR ([id number]) [id number] Page 1 of 8 Visit Report for 07/14/2022 Chief Complaint Document Details Patient Name: Date of Service: [name] [facility] [name]. 07/14/2022 3:30 PM Medical Record Number: [id number] Patient Account Number: [id number] Date of Birth/Sex: Treating RN: 1940/10/09 (82 y.o. [name] Primary Care Provider: [name] Other Clinician: Massie Kluver Referring Provider: Treating Provider/Extender: [name] in Treatment: 25 Information Obtained from: Patient Chief Complaint Multiple LE Ulcers Electronic Signature(s) Signed: 07/14/2022 3:21:00 PM By: Worthy Keeler PA-C Entered By: Worthy Keeler on 07/14/2022 15:21:00 -------------------------------------------------------------------------------- HPI Details Patient Name: Date of Service: [name] [facility] [name]. 07/14/2022 3:30 PM Medical Record Number: [id number] Patient Account Number: [id number] Date of Birth/Sex: Treating RN: 02/14/1941 (82 y.o. [name] Primary Care Provider: [name] Other Clinician: Massie Kluver Referring Provider: Treating Provider/Extender: [name] in Treatment: 25 History of Present Illness HPI Description: 01-16-2022 upon evaluation today patient presents for initial inspection here in the clinic concerning issues that he has been having with wounds over the bilateral lower extremities and bilateral feet. Fortunately there does not appear to be any signs of significant infection at this point which is great news. Unfortunately his ABIs were registering at 0.49 on the left and 0.57 on the right here in the clinic on the screening today. With that being said I do think that it may possibly be true that we need to get him to formal arterial studies in order to see how things really are showing up. And the patient's not in disagreement with this for that reason we are going to make that referral  as well. With that being said he does have some significant past medical history items of note detailed below. Patient does have chronic kidney disease stage IIIb, coronary artery disease, long-term use of anticoagulant therapy, dilated cardiomyopathy. Proximal atrial fibrillation, he is on Xarelto long-term. He also has congestive heart failure, hypertension, peripheral neuropathy not related to diabetes as he is only prediabetic with a hemoglobin A1c of 5.8, and chronic venous hypertension. He has recently seen Dr. [name] show his cardiologist who did discontinue the hydrochlorothiazide and the Lasix and felt that the patient was retaining more fluid than he should be. For that reason he was started on furosemide 20 mg twice daily. 7/27; patient with bilateral lower extremity wounds. These are largely venous although he had very poor ABIs on screening test here. He has an appointment apparently tomorrow at vein and vascular for arterial studies. He was not too keen on the latter although I think I emphasized the need to do this both in terms of dealing with the current wounds and being prepared for further issues down the road. We have been using silver alginate on the wounds and Tubigrip's edema [name], [name] ([id number]) [id number] Page 2 of 8 control is better per our intake nurse 02-06-2022 upon evaluation today patient appears to be doing well with regard to his wound. Fortunately there is no signs of infection he is actually healing for the most part on the bilateral feet. His legs are also doing much better though he has 1 new area that popped up on the right leg. Fortunately there is no evidence of active infection of note his ABIs and TBI's were poor on the vascular screening on the 31st with the left being worse than the right. For that reason I am going to recommend he still continue to follow-up with vascular to discuss angioplasty/stenting.  [id number] upon  evaluation today patient appears to be doing well currently in regard to his wound on the leg although he has several other blisters that are showing up at this point. I do believe that we need to try to do something to get his swelling under control this could be of utmost importance as far as getting this healed is concerned. 03-06-2022 upon evaluation today patient appears to be doing well currently in regard to his wounds. In fact the legs are drying up and seem to be healing quite nicely the lateral wounds are still weeping a little bit about the medial side actually appears to be pretty much dry and closed. The plantar aspect of his foot still is draining a bit but again it seems to be doing much better which is great news.[name] 03-13-2022 upon evaluation patient appears to be almost completely healed. His leg is doing excellent and his foot is just about closed. With that being said he did see vascular and they actually discussed the possibility of doing an arteriogram/angiogram in order to see if anything needed to be the balloons were stented open. With that being said he decided not to do that since things are healing so well right now and he is going to consider that for future they plan to see him back in November. 03-21-2022 upon evaluation today patient appears to be doing a little worse in regard to his wound on the foot. The wound keeps seeming to pull together unfortunately which is causing a trapped some fluid is just not healing quite as well as would like to see. With that being said I am going to try to remove a bit more of the overgrowth callus tissue today and hopefully that can help get this to seal up much more effectively and quickly. Fortunately I see no signs of infection of the foot although the leg on the right does seem to show some signs of erythema today I do think we need to address that currently. This looks to be cellulitis. 03-31-2022 upon evaluation today patient appears  to be doing better in regard to the cellulitis I am pleased in that regard he is still taking the antibiotics. Fortunately I do not see any evidence of active infection locally or systemically at this time which is great news. No fevers, chills, nausea, vomiting, or diarrhea. 04-10-2022 upon evaluation today patient appears to be doing well currently in regard to his foot ulcer I think were very close to complete resolution which is great news. Fortunately I do not see any signs of active infection locally or systemically at this time. No fevers, chills, nausea, vomiting, or diarrhea. 04-24-2022 upon evaluation today patient appears to be doing about the same in regard to his foot ulcer. Were not really seeing much in the way of improvements here. With that being said I do believe that he is tolerating the dressing changes without complication. No fevers, chills, nausea, vomiting, or diarrhea. With that being said the wound on his right plantar foot just does not seem to be making progress here. I do believe that he may benefit from vascular intervention which we previously discussed but he is previously wanted to hold off on. However at this point I think that this is becoming more crucial and he did ask me about getting in touch with the vascular doctor in order to see if they can get him scheduled sooner his next follow-up until like the end of December. I am more  than happy to do this as I think it would be definitely beneficial for him. 05-01-2022 upon evaluation patient's wound bed actually showed signs of good granulation and epithelization at this point all things considered. Still his ABIs are very low is about 50% on each leg. With that being said he does have a follow-up with vascular in about 2 weeks maybe 2-1/2 weeks where he is going to have the angiogram for the right leg and then subsequently the left leg will be to follow. Nonetheless I think this is can make a big difference in getting  the wounds healed to be honest. Fortunately I do not see any signs of active infection at this point. 11/9; patient arrives in clinic with 2 new superficial wounds on the right leg and right ankle. Both of these and the same condition of the original wound. In terms of the wound on his plantar foot about the same. We have been using collagen. His arterial review/angiogram is due for November 21 05-15-2022 upon evaluation today patient appears to be doing well currently in regard to his wounds all things considered. He actually has an appointment upcoming with the vascular surgeon where he is going to be undergoing intervention. Obviously I think this could be beneficial as far as helping this right leg and foot to heal it also allows to be able to put compression on which I think is much needed. He voiced understanding. That is on 21 November.. 05-29-2022 upon evaluation today patient appears to be doing significantly better in regard to his leg in general he seems to have much better blood flow compared to previous. Fortunately there does not appear to be any signs of infection locally nor systemically at this time. The good news is post his intervention procedure with Dr. Delana Meyer he only has 10% residual blockage in the right leg and to be honest this already looks much better from a color perspective I am very pleased with where we stand. 06-05-2022 upon evaluation today patient appears to be doing well currently in regard to his wounds. He has been tolerating the dressing changes the legs all appear to be healed. In regard to the foot this is measuring much smaller and doing much better and overall I am extremely pleased with where we stand currently. I do not see any signs of active infection locally or systemically at this time. 06-12-2022 upon evaluation today patient appears to be doing well in regard to his right foot ulcer which is almost completely healed I am actually very pleased with  where things stand. I do not see any signs of active infection at this time. 06-19-2022 upon evaluation patient actually appears to be making excellent progress at this point. Fortunately I do not see any signs of infection locally nor systemically which is great news and overall I am extremely pleased with where we are currently. In general I think that the patient is very close to complete resolution in regard to his foot. 12/28; patient comes in with a small wound on his right plantar foot a nondiabetic neuropathic wound also in the setting with lower extremity chronic venous insufficiency and apparently he has had interventions for PAD. He was hoping that the wound would be closed this week. Has been using Prisma and a peg assist shoe 07-04-2022 upon evaluation today patient appears to be doing well currently in regard to his foot ulcer which is actually showing signs of improvement. Unfortunately his leg is starting to open up is  not wearing like he is supposed to continue. Fortunately I do not see any signs of infection which is good news but at the same time I do not think that this is doing nearly as well as what he like to see. 07-08-2022 upon evaluation today patient appears to be doing well currently in regard to his wounds the leg is better the foot is also looking better although it is not completely healed up it actually had a little bit of skin callus that builds up over top. I did perform debridement to clear this away today and he tolerated that without complication. 07-14-2022 upon evaluation today patient actually appears to be doing excellent in fact he appears to be completely healed based on what I am seeing. There does not appear to be any signs of active infection locally nor systemically which is great news. No fevers, chills, nausea, vomiting, or diarrhea. Electronic Signature(s) Signed: 07/14/2022 4:37:20 PM By: Worthy Keeler PA-C Entered By: Worthy Keeler on 07/14/2022  16:37:20 [name], [name] ([id number]) [id number] Page 3 of 8 -------------------------------------------------------------------------------- Physical Exam Details Patient Name: Date of Service: [name] [facility] [name]. 07/14/2022 3:30 PM Medical Record Number: [id number] Patient Account Number: [id number] Date of Birth/Sex: Treating RN: 1940/08/20 (82 y.o. [name] Primary Care Provider: [name] Other Clinician: Massie Kluver Referring Provider: Treating Provider/Extender: [name] in Treatment: 65 Constitutional Well-nourished and well-hydrated in no acute distress. Respiratory normal breathing without difficulty. Psychiatric this patient is able to make decisions and demonstrates good insight into disease process. Alert and Oriented x 3. pleasant and cooperative. Notes Upon inspection patient's wound bed actually showed signs of excellent granulation and epithelization both in regard to the leg as well as the foot. Everything is closed although there is brand-new baby skin on the bottom of the foot he is going to need to continue to monitor and protect this I would say really for the next month just because of the tender area that is sent. Electronic Signature(s) Signed: 07/14/2022 4:37:42 PM By: Worthy Keeler PA-C Entered By: Worthy Keeler on 07/14/2022 16:37:42 -------------------------------------------------------------------------------- Physician Orders Details Patient Name: Date of Service: [name] [facility] [name]. 07/14/2022 3:30 PM Medical Record Number: [id number] Patient Account Number: [id number] Date of Birth/Sex: Treating RN: May 03, 1941 (82 y.o. [name], [name] Primary Care Provider: [name] Other Clinician: Massie Kluver Referring Provider: Treating Provider/Extender: [name] in Treatment: 25 Verbal / Phone Orders: No Diagnosis Coding ICD-10 Coding Code Description [id number]  Chronic venous hypertension (idiopathic) with ulcer and inflammation of bilateral lower extremity L97.812 Non-pressure chronic ulcer of other part of right lower leg with fat layer exposed L97.822 Non-pressure chronic ulcer of other part of left lower leg with fat layer exposed G90.09 Other idiopathic peripheral autonomic neuropathy Bussie, Wilmer [name] ([id number]) [id number] Page 4 of 8 [phone] Non-pressure chronic ulcer of other part of left foot with fat layer exposed L97.512 Non-pressure chronic ulcer of other part of right foot with fat layer exposed I10 Essential (primary) hypertension I50.42 Chronic combined systolic (congestive) and diastolic (congestive) heart failure I48.0 Paroxysmal atrial fibrillation I42.0 Dilated cardiomyopathy Z79.01 Long term (current) use of anticoagulants I25.10 Atherosclerotic heart disease of native coronary artery without angina pectoris N18.32 Chronic kidney disease, stage 3b Discharge From [facility] Discharge from [facility] Complete Wear compression garments daily. Put garments on first thing when you wake up and remove them before bed. Moisturize legs daily after removing compression  garments. - apply AandD ointment to legs daily Additional Orders / Instructions Other: - apply Silver Cell dressing to foot for 1 month and cover with bandaid Electronic Signature(s) Signed: 07/15/2022 5:09:00 PM By: Worthy Keeler PA-C Signed: 07/18/2022 9:50:53 AM By: Massie Kluver Entered By: Massie Kluver on 07/14/2022 16:37:26 -------------------------------------------------------------------------------- Problem List Details Patient Name: Date of Service: [name], [name] [facility] [name]. 07/14/2022 3:30 PM Medical Record Number: [id number] Patient Account Number: [id number] Date of Birth/Sex: Treating RN: [birth year] (82 y.o. [name], [name] Primary Care Provider: [name] Other Clinician: Massie Kluver Referring  Provider: Treating Provider/Extender: [name] in Treatment: 25 Active Problems ICD-10 Encounter Code Description Active Date MDM Diagnosis I87.333 Chronic venous hypertension (idiopathic) with ulcer and inflammation of 01/16/2022 No Yes bilateral lower extremity L97.812 Non-pressure chronic ulcer of other part of right lower leg with fat layer 01/16/2022 No Yes exposed L97.822 Non-pressure chronic ulcer of other part of left lower leg with fat layer exposed 01/16/2022 No Yes G90.09 Other idiopathic peripheral autonomic neuropathy 01/16/2022 No Yes L97.522 Non-pressure chronic ulcer of other part of left foot with fat layer exposed 01/16/2022 No Yes [name], [name] ([id number]) [id number] Page 5 of 8 [phone] Non-pressure chronic ulcer of other part of right foot with fat layer exposed 01/16/2022 No Yes I10 Essential (primary) hypertension 01/16/2022 No Yes I50.42 Chronic combined systolic (congestive) and diastolic (congestive) heart failure 01/16/2022 No Yes I48.0 Paroxysmal atrial fibrillation 01/16/2022 No Yes I42.0 Dilated cardiomyopathy 01/16/2022 No Yes Z79.01 Long term (current) use of anticoagulants 01/16/2022 No Yes I25.10 Atherosclerotic heart disease of native coronary artery without angina pectoris 01/16/2022 No Yes N18.32 Chronic kidney disease, stage 3b 01/16/2022 No Yes Inactive Problems Resolved Problems Electronic Signature(s) Signed: 07/14/2022 3:20:55 PM By: Worthy Keeler PA-C Entered By: Worthy Keeler on 07/14/2022 15:20:54 -------------------------------------------------------------------------------- Progress Note Details Patient Name: Date of Service: [name] [facility] [name]. 07/14/2022 3:30 PM Medical Record Number: [id number] Patient Account Number: [id number] Date of Birth/Sex: Treating RN: [birth year] (82 y.o. [name] Primary Care Provider: [name] Other Clinician: Massie Kluver Referring  Provider: Treating Provider/Extender: [name] in Treatment: 25 Subjective Chief Complaint Information obtained from Patient Multiple LE Ulcers History of Present Illness (HPI) 01-16-2022 upon evaluation today patient presents for initial inspection here in the clinic concerning issues that he has been having with wounds over the bilateral lower extremities and bilateral feet. Fortunately there does not appear to be any signs of significant infection at this point which is great news. Unfortunately his ABIs were registering at 0.49 on the left and 0.57 on the right here in the clinic on the screening today. With that being said I do think that it may possibly be true that we need to get him to formal arterial studies in order to see how things really are showing up. And the patient's not in disagreement with this for that reason we are going to make that referral as well. With that being said he does have some significant past medical history items of note [name], [name] ([id number]) [id number] Page 6 of 8 detailed below. Patient does have chronic kidney disease stage IIIb, coronary artery disease, long-term use of anticoagulant therapy, dilated cardiomyopathy. Proximal atrial fibrillation, he is on Xarelto long-term. He also has congestive heart failure, hypertension, peripheral neuropathy not related to diabetes as he is only prediabetic with a hemoglobin A1c of 5.8, and chronic venous hypertension. He has recently seen Dr. [name] show his cardiologist  who did discontinue the hydrochlorothiazide and the Lasix and felt that the patient was retaining more fluid than he should be. For that reason he was started on furosemide 20 mg twice daily. 7/27; patient with bilateral lower extremity wounds. These are largely venous although he had very poor ABIs on screening test here. He has an appointment apparently tomorrow at vein and vascular for  arterial studies. He was not too keen on the latter although I think I emphasized the need to do this both in terms of dealing with the current wounds and being prepared for further issues down the road. We have been using silver alginate on the wounds and Tubigrip's edema control is better per our intake nurse 02-06-2022 upon evaluation today patient appears to be doing well with regard to his wound. Fortunately there is no signs of infection he is actually healing for the most part on the bilateral feet. His legs are also doing much better though he has 1 new area that popped up on the right leg. Fortunately there is no evidence of active infection of note his ABIs and TBI's were poor on the vascular screening on the 31st with the left being worse than the right. For that reason I am going to recommend he still continue to follow-up with vascular to discuss angioplasty/stenting. 8-124-23 upon evaluation today patient appears to be doing well currently in regard to his wound on the leg although he has several other blisters that are showing up at this point. I do believe that we need to try to do something to get his swelling under control this could be of utmost importance as far as getting this healed is concerned. 03-06-2022 upon evaluation today patient appears to be doing well currently in regard to his wounds. In fact the legs are drying up and seem to be healing quite nicely the lateral wounds are still weeping a little bit about the medial side actually appears to be pretty much dry and closed. The plantar aspect of his foot still is draining a bit but again it seems to be doing much better which is great news.[name] 03-13-2022 upon evaluation patient appears to be almost completely healed. His leg is doing excellent and his foot is just about closed. With that being said he did see vascular and they actually discussed the possibility of doing an arteriogram/angiogram in order to see if anything needed  to be the balloons were stented open. With that being said he decided not to do that since things are healing so well right now and he is going to consider that for future they plan to see him back in November. 03-21-2022 upon evaluation today patient appears to be doing a little worse in regard to his wound on the foot. The wound keeps seeming to pull together unfortunately which is causing a trapped some fluid is just not healing quite as well as would like to see. With that being said I am going to try to remove a bit more of the overgrowth callus tissue today and hopefully that can help get this to seal up much more effectively and quickly. Fortunately I see no signs of infection of the foot although the leg on the right does seem to show some signs of erythema today I do think we need to address that currently. This looks to be cellulitis. 03-31-2022 upon evaluation today patient appears to be doing better in regard to the cellulitis I am pleased in that regard he is still  taking the antibiotics. Fortunately I do not see any evidence of active infection locally or systemically at this time which is great news. No fevers, chills, nausea, vomiting, or diarrhea. 04-10-2022 upon evaluation today patient appears to be doing well currently in regard to his foot ulcer I think were very close to complete resolution which is great news. Fortunately I do not see any signs of active infection locally or systemically at this time. No fevers, chills, nausea, vomiting, or diarrhea. 04-24-2022 upon evaluation today patient appears to be doing about the same in regard to his foot ulcer. Were not really seeing much in the way of improvements here. With that being said I do believe that he is tolerating the dressing changes without complication. No fevers, chills, nausea, vomiting, or diarrhea. With that being said the wound on his right plantar foot just does not seem to be making progress here. I do believe that  he may benefit from vascular intervention which we previously discussed but he is previously wanted to hold off on. However at this point I think that this is becoming more crucial and he did ask me about getting in touch with the vascular doctor in order to see if they can get him scheduled sooner his next follow-up until like the end of December. I am more than happy to do this as I think it would be definitely beneficial for him. 05-01-2022 upon evaluation patient's wound bed actually showed signs of good granulation and epithelization at this point all things considered. Still his ABIs are very low is about 50% on each leg. With that being said he does have a follow-up with vascular in about 2 weeks maybe 2-1/2 weeks where he is going to have the angiogram for the right leg and then subsequently the left leg will be to follow. Nonetheless I think this is can make a big difference in getting the wounds healed to be honest. Fortunately I do not see any signs of active infection at this point. 11/9; patient arrives in clinic with 2 new superficial wounds on the right leg and right ankle. Both of these and the same condition of the original wound. In terms of the wound on his plantar foot about the same. We have been using collagen. His arterial review/angiogram is due for November 21 05-15-2022 upon evaluation today patient appears to be doing well currently in regard to his wounds all things considered. He actually has an appointment upcoming with the vascular surgeon where he is going to be undergoing intervention. Obviously I think this could be beneficial as far as helping this right leg and foot to heal it also allows to be able to put compression on which I think is much needed. He voiced understanding. That is on 21 November.. 05-29-2022 upon evaluation today patient appears to be doing significantly better in regard to his leg in general he seems to have much better blood flow compared to  previous. Fortunately there does not appear to be any signs of infection locally nor systemically at this time. The good news is post his intervention procedure with Dr. Delana Meyer he only has 10% residual blockage in the right leg and to be honest this already looks much better from a color perspective I am very pleased with where we stand. 06-05-2022 upon evaluation today patient appears to be doing well currently in regard to his wounds. He has been tolerating the dressing changes the legs all appear to be healed. In regard to the foot  this is measuring much smaller and doing much better and overall I am extremely pleased with where we stand currently. I do not see any signs of active infection locally or systemically at this time. 06-12-2022 upon evaluation today patient appears to be doing well in regard to his right foot ulcer which is almost completely healed I am actually very pleased with where things stand. I do not see any signs of active infection at this time. 06-19-2022 upon evaluation patient actually appears to be making excellent progress at this point. Fortunately I do not see any signs of infection locally nor systemically which is great news and overall I am extremely pleased with where we are currently. In general I think that the patient is very close to complete resolution in regard to his foot. 12/28; patient comes in with a small wound on his right plantar foot a nondiabetic neuropathic wound also in the setting with lower extremity chronic venous insufficiency and apparently he has had interventions for PAD. He was hoping that the wound would be closed this week. Has been using Prisma and a peg assist shoe 07-04-2022 upon evaluation today patient appears to be doing well currently in regard to his foot ulcer which is actually showing signs of improvement. Unfortunately his leg is starting to open up is not wearing like he is supposed to continue. Fortunately I do not see any signs  of infection which is good news but at the same time I do not think that this is doing nearly as well as what he like to see. [name], [name] ([id number]) [id number] Page 7 of 8 07-08-2022 upon evaluation today patient appears to be doing well currently in regard to his wounds the leg is better the foot is also looking better although it is not completely healed up it actually had a little bit of skin callus that builds up over top. I did perform debridement to clear this away today and he tolerated that without complication. 07-14-2022 upon evaluation today patient actually appears to be doing excellent in fact he appears to be completely healed based on what I am seeing. There does not appear to be any signs of active infection locally nor systemically which is great news. No fevers, chills, nausea, vomiting, or diarrhea. Objective Constitutional Well-nourished and well-hydrated in no acute distress. Vitals Time Taken: 4:16 PM, Height: 74 in, Weight: 244 lbs, BMI: 31.3, Temperature: 98.1 F, Pulse: 59 bpm, Respiratory Rate: 18 breaths/min, Blood Pressure: 114/61 mmHg. Respiratory normal breathing without difficulty. Psychiatric this patient is able to make decisions and demonstrates good insight into disease process. Alert and Oriented x 3. pleasant and cooperative. General Notes: Upon inspection patient's wound bed actually showed signs of excellent granulation and epithelization both in regard to the leg as well as the foot. Everything is closed although there is brand-new baby skin on the bottom of the foot he is going to need to continue to monitor and protect this I would say really for the next month just because of the tender area that is sent. Integumentary (Hair, Skin) Wound #2 status is Healed - Epithelialized. Original cause of wound was Gradually Appeared. The date acquired was: 12/28/2020. The wound has been in treatment 25 weeks. The wound is located on  the [place]. The wound measures 0cm length x 0cm width x 0cm depth; 0cm^2 area and 0cm^3 volume. There is a none present amount of drainage noted. There is no granulation within the wound bed. There is no necrotic  tissue within the wound bed. Assessment Active Problems ICD-10 Chronic venous hypertension (idiopathic) with ulcer and inflammation of bilateral lower extremity Non-pressure chronic ulcer of other part of right lower leg with fat layer exposed Non-pressure chronic ulcer of other part of left lower leg with fat layer exposed Other idiopathic peripheral autonomic neuropathy Non-pressure chronic ulcer of other part of left foot with fat layer exposed Non-pressure chronic ulcer of other part of right foot with fat layer exposed Essential (primary) hypertension Chronic combined systolic (congestive) and diastolic (congestive) heart failure Paroxysmal atrial fibrillation Dilated cardiomyopathy Long term (current) use of anticoagulants Atherosclerotic 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 Put garments on first thing when you wake up and remove them before bed. Moisturize legs daily after removing compression garments. - apply AandD ointment to legs daily Additional Orders / Instructions: Other: - apply Silver Cell dressing to foot for 1 month and cover with bandaid 1. I am going to recommend that we have the patient continue with the silver alginate over the healed area since he has plenty of this to use his padding along with a Band-Aid as he has been doing in order to protect the newly healed skin here. 2. I am also can recommend patient should continue to use AandD ointment over the leg he should do this at night primarily and then during the day he can wear his compression socks. This should be  bilaterally. 3. I am also can recommend that he continue to elevate his legs much as possible is much as he can do this I think he will see signs of improvement going forward. We will see him back for follow-up visit as needed. [name], [name] ([id number]) [id number] Page 8 of 8 Electronic Signature(s) Signed: 07/14/2022 4:38:34 PM By: Worthy Keeler PA-C Entered By: Worthy Keeler on 07/14/2022 16:38:33 -------------------------------------------------------------------------------- SuperBill Details Patient Name: Date of Service: [name] [facility] [name]. 07/14/2022 Medical Record Number: [id number] Patient Account Number: [id number] Date of Birth/Sex: Treating RN: 30-Jun-1941 (82 y.o. [name], [name] Primary Care Provider: [name] Other Clinician: Massie Kluver Referring Provider: Treating Provider/Extender: [name] in Treatment: 25 Diagnosis Coding ICD-10 Codes Code Description [phone] Chronic venous hypertension (idiopathic) with ulcer and inflammation of bilateral lower extremity L97.812 Non-pressure chronic ulcer of other part of right lower leg with fat layer exposed L97.822 Non-pressure chronic ulcer of other part of left lower leg with fat layer exposed G90.09 Other idiopathic peripheral autonomic neuropathy L97.522 Non-pressure chronic ulcer of other part of left foot with fat layer exposed L97.512 Non-pressure chronic ulcer of other part of right foot with fat layer exposed I10 Essential (primary) hypertension I50.42 Chronic combined systolic (congestive) and diastolic (congestive) heart failure I48.0 Paroxysmal atrial fibrillation I42.0 Dilated cardiomyopathy Z79.01 Long term (current) use of anticoagulants I25.10 Atherosclerotic heart disease of native coronary artery without angina pectoris N18.32 Chronic kidney disease, stage 3b Physician Procedures : CPT4 Code Description Modifier [id number] - WC PHYS LEVEL 3 -  EST PT ICD-10 Diagnosis Description I87.333 Chronic venous hypertension (idiopathic) with ulcer and inflammation of bilateral lower extremity L97.812 Non-pressure chronic ulcer of other  part of right lower leg with fat layer exposed L97.822 Non-pressure chronic ulcer of other part of left lower leg with fat layer exposed G90.09 Other idiopathic peripheral autonomic neuropathy Quantity: 1 Electronic Signature(s) Signed: 07/15/2022 5:10:22 PM  By: Worthy Keeler PA-C Previous Signature: 07/15/2022 5:09:00 PM Version By: Worthy Keeler PA-C Previous Signature: 07/14/2022 4:38:47 PM Version By: Worthy Keeler PA-C Entered By: Worthy Keeler on 07/15/2022 17:10:22

## 2022-07-15 ENCOUNTER — Ambulatory Visit
Admission: RE | Admit: 2022-07-15 | Discharge: 2022-07-15 | Disposition: A | Discharge status: Home / Self Care | Payer: PPO | Source: Ambulatory Visit | Attending: Vascular Surgery | Admitting: Vascular Surgery

## 2022-07-15 ENCOUNTER — Encounter
Admission: RE | Disposition: A | Discharge status: Home / Self Care | Payer: Self-pay | Source: Ambulatory Visit | Attending: Vascular Surgery

## 2022-07-15 ENCOUNTER — Other Ambulatory Visit: Payer: Self-pay

## 2022-07-15 ENCOUNTER — Ambulatory Visit: Payer: PPO | Admitting: Physician Assistant

## 2022-07-15 ENCOUNTER — Encounter: Payer: Self-pay | Admitting: Vascular Surgery

## 2022-07-15 DIAGNOSIS — I70223 Atherosclerosis of native arteries of extremities with rest pain, bilateral legs: Secondary | ICD-10-CM | POA: Insufficient documentation

## 2022-07-15 DIAGNOSIS — M5416 Radiculopathy, lumbar region: Secondary | ICD-10-CM | POA: Insufficient documentation

## 2022-07-15 DIAGNOSIS — I70235 Atherosclerosis of native arteries of right leg with ulceration of other part of foot: Secondary | ICD-10-CM | POA: Diagnosis not present

## 2022-07-15 DIAGNOSIS — L97529 Non-pressure chronic ulcer of other part of left foot with unspecified severity: Secondary | ICD-10-CM | POA: Diagnosis not present

## 2022-07-15 DIAGNOSIS — L97919 Non-pressure chronic ulcer of unspecified part of right lower leg with unspecified severity: Secondary | ICD-10-CM | POA: Diagnosis not present

## 2022-07-15 DIAGNOSIS — I70239 Atherosclerosis of native arteries of right leg with ulceration of unspecified site: Secondary | ICD-10-CM | POA: Diagnosis not present

## 2022-07-15 DIAGNOSIS — Z87891 Personal history of nicotine dependence: Secondary | ICD-10-CM | POA: Insufficient documentation

## 2022-07-15 DIAGNOSIS — I10 Essential (primary) hypertension: Secondary | ICD-10-CM | POA: Insufficient documentation

## 2022-07-15 DIAGNOSIS — L97519 Non-pressure chronic ulcer of other part of right foot with unspecified severity: Secondary | ICD-10-CM | POA: Insufficient documentation

## 2022-07-15 DIAGNOSIS — I70249 Atherosclerosis of native arteries of left leg with ulceration of unspecified site: Secondary | ICD-10-CM

## 2022-07-15 DIAGNOSIS — L97929 Non-pressure chronic ulcer of unspecified part of left lower leg with unspecified severity: Secondary | ICD-10-CM

## 2022-07-15 DIAGNOSIS — I70229 Atherosclerosis of native arteries of extremities with rest pain, unspecified extremity: Secondary | ICD-10-CM

## 2022-07-15 DIAGNOSIS — I70245 Atherosclerosis of native arteries of left leg with ulceration of other part of foot: Secondary | ICD-10-CM | POA: Diagnosis not present

## 2022-07-15 HISTORY — PX: LOWER EXTREMITY ANGIOGRAPHY: CATH118251

## 2022-07-15 LAB — CREATININE, SERUM
Creatinine, Ser: 2.25 mg/dL — ABNORMAL HIGH (ref 0.61–1.24)
GFR, Estimated: 29 mL/min — ABNORMAL LOW (ref >60)

## 2022-07-15 LAB — BUN: BUN: 24 mg/dL — ABNORMAL HIGH (ref 8–23)

## 2022-07-15 SURGERY — LOWER EXTREMITY ANGIOGRAPHY
Anesthesia: Moderate Sedation | Site: Leg Lower | Laterality: Left

## 2022-07-15 MED ORDER — CEFAZOLIN SODIUM-DEXTROSE 2-4 GM/100ML-% IV SOLN
INTRAVENOUS | Status: AC
  Administered 2022-07-15: 2 g via INTRAVENOUS
  Filled 2022-07-15: qty 100

## 2022-07-15 MED ORDER — FENTANYL CITRATE (PF) 100 MCG/2ML IJ SOLN
INTRAMUSCULAR | Status: AC
  Filled 2022-07-15: qty 2

## 2022-07-15 MED ORDER — SODIUM CHLORIDE 0.9 % IV SOLN
INTRAVENOUS | Status: DC | PRN
  Administered 2022-07-15: 100 mL/h via INTRAVENOUS

## 2022-07-15 MED ORDER — HEPARIN SODIUM (PORCINE) 1000 UNIT/ML IJ SOLN
INTRAMUSCULAR | Status: AC
  Filled 2022-07-15: qty 10

## 2022-07-15 MED ORDER — MIDAZOLAM HCL 5 MG/5ML IJ SOLN
INTRAMUSCULAR | Status: AC
  Filled 2022-07-15: qty 5

## 2022-07-15 MED ORDER — MIDAZOLAM HCL 2 MG/2ML IJ SOLN
INTRAMUSCULAR | Status: DC | PRN
  Administered 2022-07-15 (×2): 1 mg via INTRAVENOUS

## 2022-07-15 MED ORDER — IODIXANOL 320 MG/ML IV SOLN
INTRAVENOUS | Status: DC | PRN
  Administered 2022-07-15: 50 mL

## 2022-07-15 MED ORDER — FENTANYL CITRATE (PF) 100 MCG/2ML IJ SOLN
INTRAMUSCULAR | Status: DC | PRN
  Administered 2022-07-15: 25 ug via INTRAVENOUS
  Administered 2022-07-15: 50 ug via INTRAVENOUS

## 2022-07-15 MED ORDER — MORPHINE SULFATE (PF) 4 MG/ML IV SOLN: 2.0000 mg | INTRAVENOUS | Status: DC | PRN

## 2022-07-15 MED ORDER — HEPARIN SODIUM (PORCINE) 1000 UNIT/ML IJ SOLN
INTRAMUSCULAR | Status: DC | PRN
  Administered 2022-07-15: 6000 [IU] via INTRAVENOUS

## 2022-07-15 MED ORDER — CEFAZOLIN SODIUM-DEXTROSE 2-4 GM/100ML-% IV SOLN: 2.0000 g | INTRAVENOUS | Status: AC

## 2022-07-15 MED ORDER — SODIUM CHLORIDE 0.9 % IV SOLN: INTRAVENOUS | Status: DC

## 2022-07-15 SURGICAL SUPPLY — 18 items
CATH ANGIO 5F PIGTAIL 65CM (CATHETERS) IMPLANT
CATH BEACON 5 .038 100 VERT TP (CATHETERS) IMPLANT
COVER DRAPE FLUORO 36X44 (DRAPES) IMPLANT
COVER PROBE ULTRASOUND 5X96 (MISCELLANEOUS) IMPLANT
DEVICE STARCLOSE SE CLOSURE (Vascular Products) IMPLANT
GLIDEWIRE ADV .035X260CM (WIRE) IMPLANT
GOWN STRL REUS W/ TWL LRG LVL3 (GOWN DISPOSABLE) ×1 IMPLANT
GOWN STRL REUS W/TWL LRG LVL3 (GOWN DISPOSABLE) ×1
KIT ENCORE 26 ADVANTAGE (KITS) IMPLANT
NDL ENTRY 21GA 7CM ECHOTIP (NEEDLE) IMPLANT
NEEDLE ENTRY 21GA 7CM ECHOTIP (NEEDLE) ×1 IMPLANT
PACK ANGIOGRAPHY (CUSTOM PROCEDURE TRAY) ×1 IMPLANT
SET INTRO CAPELLA COAXIAL (SET/KITS/TRAYS/PACK) IMPLANT
SHEATH ANL2 6FRX45 HC (SHEATH) IMPLANT
SHEATH BRITE TIP 5FRX11 (SHEATH) IMPLANT
SYR MEDRAD MARK 7 150ML (SYRINGE) IMPLANT
TUBING CONTRAST HIGH PRESS 72 (TUBING) IMPLANT
WIRE GUIDERIGHT .035X150 (WIRE) IMPLANT

## 2022-07-15 NOTE — Op Note (Signed)
[name]  Percutaneous Study/Intervention Procedural Note   Date of Surgery: 07/15/2022,11:54 AM  Surgeon:Benino Korinek, Dolores Lory   Pre-operative Diagnosis: Atherosclerotic occlusive disease bilateral lower extremities with ulceration of the bilateral lower extremities  Post-operative diagnosis:  Same  Procedure(s) Performed:  1.  Abdominal aortogram  2.  Selective injection of the left lower extremity third order catheter placement  3.  Ultrasound-guided access to the right common femoral artery  4.  StarClose right femoral artery    Anesthesia: Conscious sedation was administered by the interventional radiology RN under my direct supervision. IV Versed plus fentanyl were utilized. Continuous ECG, pulse oximetry and blood pressure was monitored throughout the entire procedure.  Conscious sedation was administered for a total of 30 minutes and 36 seconds.  Sheath: 6 [place] Ansell sheath retrograde right common femoral  Contrast: 50 cc   Fluoroscopy Time: 4.6 minutes  Indications:  The patient presents to [facility] with atherosclerotic occlusive disease bilateral lower extremities with ulcerations noted bilaterally left lower extremity is worse and is therefore being addressed.  Pedal pulses are nonpalpable bilaterally suggesting hemodynamically significant atherosclerotic occlusive disease.  The risks and benefits as well as alternative therapies for lower extremity revascularization are reviewed with the patient all questions are answered the patient agrees to proceed.  The patient is therefore undergoing angiography with the hope for intervention for limb salvage.   Procedure:  [name] Lyonsis a 82 y.o. male who was identified and appropriate procedural time out was performed.  The patient was then placed supine on the table and prepped and draped in the usual sterile fashion.  Ultrasound was used to evaluate the right right common femoral artery.   It was echolucent and pulsatile indicating it is patent .  An ultrasound image was acquired for the permanent record.  A micropuncture needle was used to access the right common femoral artery under direct ultrasound guidance.  The microwire was then advanced under fluoroscopic guidance without difficulty followed by the micro-sheath.  A 0.035 J wire was advanced without resistance and a 5Fr sheath was placed.    Pigtail catheter was then advanced to the level of T12 and AP projection of the aorta was obtained. Pigtail catheter was then repositioned to above the bifurcation and RAO view of the pelvis was obtained. Stiff angled Glidewire and pigtail catheter was then used across the bifurcation and the catheter was positioned in the distal external iliac artery.  LAO of the left groin was then obtained. Wire was reintroduced and negotiated into the SFA and the catheter was advanced into the SFA. Distal runoff was then performed.  After review of the images the catheter was removed over wire and an RAO view of the groin was obtained. StarClose device was deployed without difficulty.   Findings:   Aortogram: The abdominal aorta is opacified with a bolus injection contrast.  Single renal arteries are noted bilaterally with normal nephrograms.  No evidence of hemodynamically significant renal artery stenosis.  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  The aortic bifurcation is mildly diseased but widely patent.  Bilateral common internal and external iliac arteries are free of hemodynamically significant lesions.  Left lower Extremity: The left common femoral and profunda femoris are diffusely diseased but there are no hemodynamically significant stenoses noted.  The left common femoral demonstrates moderate calcific plaque of approximately 50 to 60%.  The superficial femoral and popliteal  arteries are  essentially occluded at multiple levels throughout their entire course.  There are small segments that reconstitute but then reocclude.  Trifurcation is profoundly diseased essentially occluded.  There is nonvisualization of the anterior tibial.  The tibioperoneal trunk appears to have hemodynamically significant stenosis.  At the origin of the posterior tibial there appears to be reconstitution from extensive profunda and geniculate collaterals and the posterior tibial artery is of good size and widely patent down to the foot filling the pedal arch.  The peroneal is faintly visualized but does not appear to be of good caliber.    SUMMARY: Based on these images no intervention is performed at this time.  Given the extensive occlusive disease throughout the femoral-popliteal segment as well as the disease within the trifurcation itself in association with the patient's poor renal status his best option would be a femoral endarterectomy with femoral to posterior tibial in situ bypass.  This will be discussed with him when he returns to the office and vein mapping is performed.    Disposition: Patient was taken to the recovery room in stable condition having tolerated the procedure well.  [name] 07/15/2022,11:54 AM

## 2022-07-15 NOTE — Progress Notes (Signed)
MRN : [id number]  [name] ATTEBERRY Sr. is a 82 y.o. ([birth year]) male who presents with chief complaint of check circulation.  History of Present Illness:   The patient presents to [facility] for treatment of his left lower extremity atherosclerotic occlusive disease associated with ulcerations.  The patient is status post angiogram with intervention on 05/20/2022.    Procedure: Procedure(s) Performed:             1.  Introduction catheter into right lower extremity 3rd order catheter placement              2.    Contrast injection right lower extremity for distal runoff             3.  Percutaneous transluminal angioplasty and stent placement right SFA and popliteal             4.  Star close closure left common femoral arteriotomy     The patient notes improvement in the lower extremity symptoms of the right lower extremity.  The wounds on his leg have healed but the wound on his foot is still continuing to heal.  Per wound care they are pleased with his current progress.  He does note some worsening pain in his left lower extremity as well as some swelling in his left foot.   There have been no significant changes to the patient's overall health care.   No documented history of amaurosis fugax or recent TIA symptoms. There are no recent neurological changes noted. No documented history of DVT, PE or superficial thrombophlebitis. The patient denies recent episodes of angina or shortness of breath.    ABI's Rt=0.86 and Lt=0.36  (previous ABI's Rt=0.76 and Lt=0.79) Duplex US of the right lower extremity reveals biphasic tibial artery waveforms with normal toe waveforms.  The left lower extremity has monophasic tibial artery waveforms with diminished toe waveforms.  No outpatient medications have been marked as taking for the 07/15/22 encounter [facility] Encounter).    Past Medical History:  Diagnosis Date   Allergy    History of chicken pox     Hyperlipidemia    Hypertension     Past Surgical History:  Procedure Laterality Date   BACK SURGERY     x5   DOPPLER ECHOCARDIOGRAPHY  06/21/2013   EF=35% while in Sinus Rhythm   LOWER EXTREMITY ANGIOGRAPHY Right 05/20/2022   Procedure: Lower Extremity Angiography;  Surgeon: Katha Cabal, MD;  Location: [facility];  Service: Cardiovascular;  Laterality: Right;   Myocardial perfusion scan  10/2012   severe global LV enlargement. Mild RV enlargement. Mild LVH. Mild mitral and tricuspid insufficency   polyp excision  09/2005   UPPER GASTROINTESTINAL ENDOSCOPY  03/27/2004   Gastropathy, Gastritis; Duodenopathy    Social History Social History   Tobacco Use   Smoking status: Former    Packs/day: 2.00    Years: 50.00    Total pack years: 100.00    Types: Cigarettes    Quit date: 07/01/2007    Years since quitting: 15.0   Smokeless tobacco: Never  Vaping Use   Vaping Use: Never used  Substance Use Topics   Alcohol use: No    Alcohol/week: 0.0 standard drinks of alcohol   Drug use: No    Family History Family History  Problem Relation Age of Onset   Alzheimer's disease Mother    Heart attack  Father    Colon cancer Brother     No Known Allergies   REVIEW OF SYSTEMS (Negative unless checked)  Constitutional: [] Weight loss  [] Fever  [] Chills Cardiac: [] Chest pain   [] Chest pressure   [] Palpitations   [] Shortness of breath when laying flat   [] Shortness of breath with exertion. Vascular:  [x] Pain in legs with walking   [] Pain in legs at rest  [] History of DVT   [] Phlebitis   [] Swelling in legs   [] Varicose veins   [] Non-healing ulcers Pulmonary:   [] Uses home oxygen   [] Productive cough   [] Hemoptysis   [] Wheeze  [] COPD   [] Asthma Neurologic:  [] Dizziness   [] Seizures   [] History of stroke   [] History of TIA  [] Aphasia   [] Vissual changes   [] Weakness or numbness in arm   [] Weakness or numbness in leg Musculoskeletal:   [] Joint swelling   [] Joint pain    [] Low back pain Hematologic:  [] Easy bruising  [] Easy bleeding   [] Hypercoagulable state   [] Anemic Gastrointestinal:  [] Diarrhea   [] Vomiting  [] Gastroesophageal reflux/heartburn   [] Difficulty swallowing. Genitourinary:  [] Chronic kidney disease   [] Difficult urination  [] Frequent urination   [] Blood in urine Skin:  [] Rashes   [] Ulcers  Psychological:  [] History of anxiety   []  History of major depression.  Physical Examination  There were no vitals filed for this visit. There is no height or weight on file to calculate BMI. Gen: WD/WN, NAD Head: [place]/AT, No temporalis wasting.  Ear/Nose/Throat: Hearing grossly intact, nares w/o erythema or drainage Eyes: PER, EOMI, sclera nonicteric.  Neck: Supple, no masses.  No bruit or JVD.  Pulmonary:  Good air movement, no audible wheezing, no use of accessory muscles.  Cardiac: RRR, normal S1, S2, no Murmurs. Vascular:  mild trophic changes, open wounds noted bilaterally Vessel Right Left  Radial Palpable Palpable  PT Not Palpable Not Palpable  DP Not Palpable Not Palpable  Gastrointestinal: soft, non-distended. No guarding/no peritoneal signs.  Musculoskeletal: M/S 5/5 throughout.  No visible deformity.  Neurologic: CN 2-12 intact. Pain and light touch intact in extremities.  Symmetrical.  Speech is fluent. Motor exam as listed above. Psychiatric: Judgment intact, Mood & affect appropriate for pt's clinical situation. Dermatologic: No rashes + ulcers noted.  No changes consistent with cellulitis.   CBC Lab Results  Component Value Date   WBC 7.2 01/07/2022   HGB 11.3 (L) 01/07/2022   HCT 34.2 (L) 01/07/2022   MCV 94 01/07/2022   PLT 226 01/07/2022    BMET    Component Value Date/Time   NA 140 01/07/2022 0925   K 4.7 01/07/2022 0925   CL 100 01/07/2022 0925   CO2 23 01/07/2022 0925   GLUCOSE 87 01/07/2022 0925   GLUCOSE 108 (H) 11/26/2021 1038   BUN 26 (H) 05/20/2022 1230   BUN 32 (H) 01/07/2022 0925   CREATININE 1.64 (H)  05/20/2022 1230   CREATININE 1.71 (H) 03/19/2017 0813   CALCIUM 9.6 01/07/2022 0925   GFRNONAA 42 (L) 05/20/2022 1230   GFRNONAA 39 (L) 03/19/2017 0813   GFRAA 39 (L) 04/04/2020 0808   GFRAA 45 (L) 03/19/2017 0813   CrCl cannot be calculated (Patient's most recent lab result is older than the maximum 21 days allowed.).  COAG Lab Results  Component Value Date   INR 1.4 (H) 11/26/2021    Radiology No results found.   Assessment/Plan 1. Atherosclerosis of native artery of left lower extremity with rest pain [facility]) Currently the patient's right lower  extremity shows an improvement in ABIs but there is a significant deterioration in the left lower extremity symptoms.  The patient will continue to follow with wound care for the right.  Will continue to follow the right as well but the left lower extremity will need intervention.   Recommend:   The patient has evidence of severe atherosclerotic changes of both lower extremities with rest pain that is associated with preulcerative changes and impending tissue loss of the left foot.  This represents a limb threatening ischemia and places the patient at the risk for left limb loss.   Patient should undergo angiography of the left lower extremity with the hope for intervention for limb salvage.  The risks and benefits as well as the alternative therapies was discussed in detail with the patient.  All questions were answered.  Patient agrees to proceed with left lower extremity angiography.   The patient will follow up with me in the office after the procedure.      - VAS [place] ABI WITH/WO TBI   2. Primary hypertension Continue antihypertensive medications as already ordered, these medications have been reviewed and there are no changes at this time.   3. Lumbar radiculopathy, chronic The patient does have some ongoing left leg pain.  He has known lumbar radiculopathy but given the patient's diminished arterial flow we cannot completely rule  out this as a possible cause of his pain and symptoms.   Hortencia Pilar, MD  07/15/2022 8:25 AM

## 2022-07-15 NOTE — Interval H&P Note (Signed)
History and Physical Interval Note:  07/15/2022 10:54 AM  [name] Friends Sr.  has presented today for surgery, with the diagnosis of LLE Angio  BARD   ASO w rest pain.  The various methods of treatment have been discussed with the patient and family. After consideration of risks, benefits and other options for treatment, the patient has consented to  Procedure(s): Lower Extremity Angiography (Left) as a surgical intervention.  The patient's history has been reviewed, patient examined, no change in status, stable for surgery.  I have reviewed the patient's chart and labs.  Questions were answered to the patient's satisfaction.     Hortencia Pilar

## 2022-07-15 NOTE — H&P (View-Only) (Signed)
MRN : [id number]  [name] LIPTAK Sr. is a 82 y.o. (1940-09-22) male who presents with chief complaint of check circulation.  History of Present Illness:   The patient presents to [facility] for treatment of his left lower extremity atherosclerotic occlusive disease associated with ulcerations.  The patient is status post angiogram with intervention on 05/20/2022.    Procedure: Procedure(s) Performed:             1.  Introduction catheter into right lower extremity 3rd order catheter placement              2.    Contrast injection right lower extremity for distal runoff             3.  Percutaneous transluminal angioplasty and stent placement right SFA and popliteal             4.  Star close closure left common femoral arteriotomy     The patient notes improvement in the lower extremity symptoms of the right lower extremity.  The wounds on his leg have healed but the wound on his foot is still continuing to heal.  Per wound care they are pleased with his current progress.  He does note some worsening pain in his left lower extremity as well as some swelling in his left foot.   There have been no significant changes to the patient's overall health care.   No documented history of amaurosis fugax or recent TIA symptoms. There are no recent neurological changes noted. No documented history of DVT, PE or superficial thrombophlebitis. The patient denies recent episodes of angina or shortness of breath.    ABI's Rt=0.86 and Lt=0.36  (previous ABI's Rt=0.76 and Lt=0.79) Duplex US of the right lower extremity reveals biphasic tibial artery waveforms with normal toe waveforms.  The left lower extremity has monophasic tibial artery waveforms with diminished toe waveforms.  No outpatient medications have been marked as taking for the 07/15/22 encounter [facility] Encounter).    Past Medical History:  Diagnosis Date   Allergy    History of chicken pox     Hyperlipidemia    Hypertension     Past Surgical History:  Procedure Laterality Date   BACK SURGERY     x5   DOPPLER ECHOCARDIOGRAPHY  06/21/2013   EF=35% while in Sinus Rhythm   LOWER EXTREMITY ANGIOGRAPHY Right 05/20/2022   Procedure: Lower Extremity Angiography;  Surgeon: Katha Cabal, MD;  Location: [place] [facility];  Service: Cardiovascular;  Laterality: Right;   Myocardial perfusion scan  10/2012   severe global LV enlargement. Mild RV enlargement. Mild LVH. Mild mitral and tricuspid insufficency   polyp excision  09/2005   UPPER GASTROINTESTINAL ENDOSCOPY  03/27/2004   Gastropathy, Gastritis; Duodenopathy    Social History Social History   Tobacco Use   Smoking status: Former    Packs/day: 2.00    Years: 50.00    Total pack years: 100.00    Types: Cigarettes    Quit date: 07/01/2007    Years since quitting: 15.0   Smokeless tobacco: Never  Vaping Use   Vaping Use: Never used  Substance Use Topics   Alcohol use: No    Alcohol/week: 0.0 standard drinks of alcohol   Drug use: No    Family History Family History  Problem Relation Age of Onset   Alzheimer's disease Mother    Heart attack  Father    Colon cancer Brother     No Known Allergies   REVIEW OF SYSTEMS (Negative unless checked)  Constitutional: [] Weight loss  [] Fever  [] Chills Cardiac: [] Chest pain   [] Chest pressure   [] Palpitations   [] Shortness of breath when laying flat   [] Shortness of breath with exertion. Vascular:  [x] Pain in legs with walking   [] Pain in legs at rest  [] History of DVT   [] Phlebitis   [] Swelling in legs   [] Varicose veins   [] Non-healing ulcers Pulmonary:   [] Uses home oxygen   [] Productive cough   [] Hemoptysis   [] Wheeze  [] COPD   [] Asthma Neurologic:  [] Dizziness   [] Seizures   [] History of stroke   [] History of TIA  [] Aphasia   [] Vissual changes   [] Weakness or numbness in arm   [] Weakness or numbness in leg Musculoskeletal:   [] Joint swelling   [] Joint pain    [] Low back pain Hematologic:  [] Easy bruising  [] Easy bleeding   [] Hypercoagulable state   [] Anemic Gastrointestinal:  [] Diarrhea   [] Vomiting  [] Gastroesophageal reflux/heartburn   [] Difficulty swallowing. Genitourinary:  [] Chronic kidney disease   [] Difficult urination  [] Frequent urination   [] Blood in urine Skin:  [] Rashes   [] Ulcers  Psychological:  [] History of anxiety   []  History of major depression.  Physical Examination  There were no vitals filed for this visit. There is no height or weight on file to calculate BMI. Gen: WD/WN, NAD Head: /AT, No temporalis wasting.  Ear/Nose/Throat: Hearing grossly intact, nares w/o erythema or drainage Eyes: PER, EOMI, sclera nonicteric.  Neck: Supple, no masses.  No bruit or JVD.  Pulmonary:  Good air movement, no audible wheezing, no use of accessory muscles.  Cardiac: RRR, normal S1, S2, no Murmurs. Vascular:  mild trophic changes, open wounds noted bilaterally Vessel Right Left  Radial Palpable Palpable  PT Not Palpable Not Palpable  DP Not Palpable Not Palpable  Gastrointestinal: soft, non-distended. No guarding/no peritoneal signs.  Musculoskeletal: M/S 5/5 throughout.  No visible deformity.  Neurologic: CN 2-12 intact. Pain and light touch intact in extremities.  Symmetrical.  Speech is fluent. Motor exam as listed above. Psychiatric: Judgment intact, Mood & affect appropriate for pt's clinical situation. Dermatologic: No rashes + ulcers noted.  No changes consistent with cellulitis.   CBC Lab Results  Component Value Date   WBC 7.2 01/07/2022   HGB 11.3 (L) 01/07/2022   HCT 34.2 (L) 01/07/2022   MCV 94 01/07/2022   PLT 226 01/07/2022    BMET    Component Value Date/Time   NA 140 01/07/2022 0925   K 4.7 01/07/2022 0925   CL 100 01/07/2022 0925   CO2 23 01/07/2022 0925   GLUCOSE 87 01/07/2022 0925   GLUCOSE 108 (H) 11/26/2021 1038   BUN 26 (H) 05/20/2022 1230   BUN 32 (H) 01/07/2022 0925   CREATININE 1.64 (H)  05/20/2022 1230   CREATININE 1.71 (H) 03/19/2017 0813   CALCIUM 9.6 01/07/2022 0925   GFRNONAA 42 (L) 05/20/2022 1230   GFRNONAA 39 (L) 03/19/2017 0813   GFRAA 39 (L) 04/04/2020 0808   GFRAA 45 (L) 03/19/2017 0813   CrCl cannot be calculated (Patient's most recent lab result is older than the maximum 21 days allowed.).  COAG Lab Results  Component Value Date   INR 1.4 (H) 11/26/2021    Radiology No results found.   Assessment/Plan 1. Atherosclerosis of native artery of left lower extremity with rest pain [facility]) Currently the patient's right lower  extremity shows an improvement in ABIs but there is a significant deterioration in the left lower extremity symptoms.  The patient will continue to follow with wound care for the right.  Will continue to follow the right as well but the left lower extremity will need intervention.   Recommend:   The patient has evidence of severe atherosclerotic changes of both lower extremities with rest pain that is associated with preulcerative changes and impending tissue loss of the left foot.  This represents a limb threatening ischemia and places the patient at the risk for left limb loss.   Patient should undergo angiography of the left lower extremity with the hope for intervention for limb salvage.  The risks and benefits as well as the alternative therapies was discussed in detail with the patient.  All questions were answered.  Patient agrees to proceed with left lower extremity angiography.   The patient will follow up with me in the office after the procedure.      - VAS [place] ABI WITH/WO TBI   2. Primary hypertension Continue antihypertensive medications as already ordered, these medications have been reviewed and there are no changes at this time.   3. Lumbar radiculopathy, chronic The patient does have some ongoing left leg pain.  He has known lumbar radiculopathy but given the patient's diminished arterial flow we cannot completely rule  out this as a possible cause of his pain and symptoms.   Hortencia Pilar, MD  07/15/2022 8:25 AM

## 2022-07-16 ENCOUNTER — Encounter: Payer: Self-pay | Admitting: Vascular Surgery

## 2022-07-16 ENCOUNTER — Ambulatory Visit: Payer: PPO | Admitting: Family Medicine

## 2022-07-21 ENCOUNTER — Ambulatory Visit (INDEPENDENT_AMBULATORY_CARE_PROVIDER_SITE_OTHER): Payer: PPO | Admitting: Family Medicine

## 2022-07-21 VITALS — BP 118/48 | HR 86 | Temp 97.4°F | Wt 250.0 lb

## 2022-07-21 DIAGNOSIS — N1832 Chronic kidney disease, stage 3b: Secondary | ICD-10-CM

## 2022-07-21 DIAGNOSIS — I70203 Unspecified atherosclerosis of native arteries of extremities, bilateral legs: Secondary | ICD-10-CM | POA: Diagnosis not present

## 2022-07-21 DIAGNOSIS — I4811 Longstanding persistent atrial fibrillation: Secondary | ICD-10-CM | POA: Diagnosis not present

## 2022-07-21 DIAGNOSIS — R7303 Prediabetes: Secondary | ICD-10-CM

## 2022-07-21 DIAGNOSIS — E039 Hypothyroidism, unspecified: Secondary | ICD-10-CM | POA: Diagnosis not present

## 2022-07-21 DIAGNOSIS — Z9889 Other specified postprocedural states: Secondary | ICD-10-CM

## 2022-07-21 DIAGNOSIS — I10 Essential (primary) hypertension: Secondary | ICD-10-CM | POA: Diagnosis not present

## 2022-07-21 DIAGNOSIS — I5022 Chronic systolic (congestive) heart failure: Secondary | ICD-10-CM | POA: Diagnosis not present

## 2022-07-22 ENCOUNTER — Ambulatory Visit: Payer: PPO | Admitting: Family Medicine

## 2022-07-22 LAB — TSH: TSH: 4.61 u[IU]/mL — ABNORMAL HIGH (ref 0.450–4.500)

## 2022-07-22 LAB — LIPID PANEL
Chol/HDL Ratio: 3.5 ratio (ref 0.0–5.0)
Cholesterol, Total: 152 mg/dL (ref 100–199)
HDL: 44 mg/dL (ref >39)
LDL Chol Calc (NIH): 82 mg/dL (ref 0–99)
Triglycerides: 150 mg/dL — ABNORMAL HIGH (ref 0–149)
VLDL Cholesterol Cal: 26 mg/dL (ref 5–40)

## 2022-07-22 LAB — HEMOGLOBIN A1C
Est. average glucose Bld gHb Est-mCnc: 111 mg/dL
Hgb A1c MFr Bld: 5.5 % (ref 4.8–5.6)

## 2022-07-22 LAB — T4, FREE: Free T4: 1.44 ng/dL (ref 0.82–1.77)

## 2022-07-22 NOTE — Progress Notes (Signed)
Established patient visit   Patient: [name] SCULL Sr.   DOB: 04-Jan-1941   82 y.o. Male  MRN: [id number] Visit Date: 07/21/2022  Today's healthcare provider: Lelon Huh, MD    Subjective    HPI  Presents today for follow up of pre diabetes, hypertension, CHF, CAD and hypothyroid. Is doing well with current medication. Taking consistently. Generally feels well with no specific complaints. He is now followed by Dr. Lanora Manis for CKD and Dr. Delana Meyer for PAD, apparently considering peripheral bypass of LLE. He otherwise feels well with no specific complaints today.   Medications: Outpatient Medications Prior to Visit  Medication Sig   amiodarone (PACERONE) 200 MG tablet Take 1 tablet (200 mg total) by mouth daily.   aspirin EC 81 MG tablet Take 1 tablet (81 mg total) by mouth daily. Swallow whole.   fluticasone (FLONASE) 50 MCG/ACT nasal spray Place 2 sprays into both nostrils daily.   hydrochlorothiazide (MICROZIDE) 12.5 MG capsule Take 12.5 mg by mouth daily.   levothyroxine (SYNTHROID) 100 MCG tablet TAKE 1 TABLET BY MOUTH DAILY BEFORE BREAKFAST   lisinopril (ZESTRIL) 2.5 MG tablet Take 1 tablet (2.5 mg total) by mouth daily.   lovastatin (MEVACOR) 20 MG tablet TAKE 1 TABLET(20 MG) BY MOUTH DAILY   metoprolol succinate (TOPROL-XL) 25 MG 24 hr tablet 1 TABLET BY MOUTH ONCE A DAY (Patient taking differently: Take 12.5 mg by mouth daily. 1 TABLET BY MOUTH ONCE A DAY)   torsemide (DEMADEX) 20 MG tablet Take by mouth in the morning and at bedtime.   traMADol (ULTRAM) 50 MG tablet Take 50 mg by mouth 2 (two) times daily as needed.   XARELTO 20 MG TABS tablet Take 1 tablet (20 mg total) by mouth daily.   gabapentin (NEURONTIN) 300 MG capsule Take 1 capsule (300 mg total) by mouth 2 (two) times daily.   No facility-administered medications prior to visit.       Objective    BP (!) 118/48 (BP Location: Right Arm, Patient Position: Sitting, Cuff Size: Large)   Pulse 86   Temp  (!) 97.4 F (36.3 C)   Wt 250 lb (113.4 kg)   SpO2 96%   BMI 32.10 kg/m    Physical Exam   General: Appearance:    Mildly obese male in no acute distress  Eyes:    PERRL, conjunctiva/corneas clear, EOM's intact       Lungs:     Clear to auscultation bilaterally, respirations unlabored  Heart:    Normal heart rate. Irregularly irregular rhythm. No murmurs, rubs, or gallops.    MS:   All extremities are intact.    Neurologic:   Awake, alert, oriented x 3. No apparent focal neurological defect.         Assessment & Plan     1. Primary hypertension Well controlled.  Continue current medications.    2. Stage 3b chronic kidney disease ([place]) Continue routine follow up Dr. Mikle Bosworth.   3. Pre-diabetes  - Hemoglobin A1c  4. Atherosclerosis of native artery of both lower extremities, with unspecified presence of clinical manifestation ([place]) Asymptomatic. Compliant with medication.  Continue aggressive risk factor modification.   - Lipid panel  5. Chronic systolic heart failure (HCC) Continue routine follow up Dr. Saralyn Pilar.   6. Hypothyroidism, unspecified type  - T4, free - TSH  7. Longstanding persistent atrial fibrillation (HCC) Rate well controlled, continues on rivaroxaban with no signs of abnormal bruising or bleeding.  Lelon Huh, MD  [place] [phone] (phone) [phone] (fax)  [place]

## 2022-07-23 ENCOUNTER — Ambulatory Visit (INDEPENDENT_AMBULATORY_CARE_PROVIDER_SITE_OTHER): Payer: PPO | Admitting: Family Medicine

## 2022-07-23 ENCOUNTER — Ambulatory Visit: Payer: Self-pay

## 2022-07-23 ENCOUNTER — Encounter: Payer: Self-pay | Admitting: Family Medicine

## 2022-07-23 ENCOUNTER — Emergency Department: Payer: PPO

## 2022-07-23 ENCOUNTER — Other Ambulatory Visit: Payer: Self-pay

## 2022-07-23 ENCOUNTER — Emergency Department
Admission: EM | Admit: 2022-07-23 | Discharge: 2022-07-23 | Disposition: A | Discharge status: Home / Self Care | Payer: PPO | Attending: Emergency Medicine | Admitting: Emergency Medicine

## 2022-07-23 VITALS — BP 84/37 | HR 52 | Temp 98.3°F | Wt 245.0 lb

## 2022-07-23 DIAGNOSIS — I10 Essential (primary) hypertension: Secondary | ICD-10-CM | POA: Insufficient documentation

## 2022-07-23 DIAGNOSIS — I4891 Unspecified atrial fibrillation: Secondary | ICD-10-CM | POA: Insufficient documentation

## 2022-07-23 DIAGNOSIS — I5022 Chronic systolic (congestive) heart failure: Secondary | ICD-10-CM

## 2022-07-23 DIAGNOSIS — I4811 Longstanding persistent atrial fibrillation: Secondary | ICD-10-CM

## 2022-07-23 DIAGNOSIS — L03115 Cellulitis of right lower limb: Secondary | ICD-10-CM | POA: Diagnosis not present

## 2022-07-23 DIAGNOSIS — R7989 Other specified abnormal findings of blood chemistry: Secondary | ICD-10-CM | POA: Diagnosis not present

## 2022-07-23 DIAGNOSIS — M79604 Pain in right leg: Secondary | ICD-10-CM

## 2022-07-23 DIAGNOSIS — M7989 Other specified soft tissue disorders: Secondary | ICD-10-CM | POA: Diagnosis not present

## 2022-07-23 DIAGNOSIS — E860 Dehydration: Secondary | ICD-10-CM | POA: Insufficient documentation

## 2022-07-23 DIAGNOSIS — I959 Hypotension, unspecified: Secondary | ICD-10-CM | POA: Diagnosis not present

## 2022-07-23 DIAGNOSIS — N184 Chronic kidney disease, stage 4 (severe): Secondary | ICD-10-CM

## 2022-07-23 DIAGNOSIS — Z7901 Long term (current) use of anticoagulants: Secondary | ICD-10-CM | POA: Insufficient documentation

## 2022-07-23 LAB — CBC
HCT: 32.7 % — ABNORMAL LOW (ref 39.0–52.0)
Hemoglobin: 10.4 g/dL — ABNORMAL LOW (ref 13.0–17.0)
MCH: 29.5 pg (ref 26.0–34.0)
MCHC: 31.8 g/dL (ref 30.0–36.0)
MCV: 92.9 fL (ref 80.0–100.0)
Platelets: 305 10*3/uL (ref 150–400)
RBC: 3.52 MIL/uL — ABNORMAL LOW (ref 4.22–5.81)
RDW: 14.8 % (ref 11.5–15.5)
WBC: 7.2 10*3/uL (ref 4.0–10.5)
nRBC: 0 % (ref 0.0–0.2)

## 2022-07-23 LAB — BASIC METABOLIC PANEL
Anion gap: 8 (ref 5–15)
BUN: 22 mg/dL (ref 8–23)
CO2: 26 mmol/L (ref 22–32)
Calcium: 9 mg/dL (ref 8.9–10.3)
Chloride: 103 mmol/L (ref 98–111)
Creatinine, Ser: 2.33 mg/dL — ABNORMAL HIGH (ref 0.61–1.24)
GFR, Estimated: 28 mL/min — ABNORMAL LOW (ref >60)
Glucose, Bld: 106 mg/dL — ABNORMAL HIGH (ref 70–99)
Potassium: 4.3 mmol/L (ref 3.5–5.1)
Sodium: 137 mmol/L (ref 135–145)

## 2022-07-23 LAB — LACTIC ACID, PLASMA
Lactic Acid, Venous: 2.2 mmol/L (ref 0.5–1.9)
Lactic Acid, Venous: 1.5 mmol/L (ref 0.5–1.9)

## 2022-07-23 MED ORDER — SODIUM CHLORIDE 0.9 % IV BOLUS
1000.0000 mL | Freq: Once | INTRAVENOUS | Status: AC
  Administered 2022-07-23: 1000 mL via INTRAVENOUS

## 2022-07-23 MED ORDER — CEPHALEXIN 500 MG PO CAPS: 500.0000 mg | ORAL_CAPSULE | Freq: Four times a day (QID) | ORAL | 0 refills | Status: AC

## 2022-07-23 MED ORDER — CEFAZOLIN SODIUM-DEXTROSE 2-4 GM/100ML-% IV SOLN
2.0000 g | Freq: Once | INTRAVENOUS | Status: AC
  Administered 2022-07-23: 2 g via INTRAVENOUS
  Filled 2022-07-23: qty 100

## 2022-07-23 NOTE — Discharge Instructions (Addendum)
You were seen in the emergency department for dehydration and a cellulitis to your leg with swelling.  You had an ultrasound done of your legs that did not show any blood clots.  Your lab work was overall normal.  You were given a dose of IV antibiotics in the emergency department and started on antibiotics as an outpatient given concern for possible skin infection to your right lower leg.  It is importantly call your primary care physician and schedule close follow-up for reevaluation.  Call your vascular surgeon for follow-up  Stay hydrated and drink fluids, return to the emergency department if you have any fever, worsening swelling or dizziness/signs of dehydration.

## 2022-07-23 NOTE — Assessment & Plan Note (Signed)
Chronic, stable RC with AC on xarelto 20 mg Educated that it would be too dangerous to "open and drain" his leg in office given use of AC

## 2022-07-23 NOTE — Assessment & Plan Note (Signed)
Chronic, variable With hx of creatinine >2

## 2022-07-23 NOTE — Assessment & Plan Note (Signed)
Chronic, worsening CKD has advanced from 3b to 4

## 2022-07-23 NOTE — ED Provider Notes (Signed)
[facility] Provider Note    Event Date/Time   First MD Initiated Contact with Patient 07/23/22 1747     (approximate)   History   Leg Pain   HPI  [name] SHIMABUKURO Sr. is a 82 y.o. male with past medical history significant for PAD, hypertension, hyperlipidemia, who presents to the emergency department following an episode of low blood pressure with his primary care physician and with right lower leg swelling and redness.  Patient states that over the past 1 week he has had progressively worsening swelling and redness to his right lower leg.  Denies any fever or chills.  States today he went to be evaluated by his primary care physician office and was told to come to the emergency department due to a low blood pressure.  States that he has only been drinking a small sip of water every day because he was told by his kidney doctor to decrease his water intake.  Endorses decreased urine output.  No dysuria, urinary urgency or frequency.  Denies any chest pain or shortness of breath.  No recent falls or trauma.  No history of DVT.  Patient is on anticoagulation with Xarelto for his atrial fibrillation.     Physical Exam   Triage Vital Signs: ED Triage Vitals  Enc Vitals Group     BP 07/23/22 1531 (!) 107/58     Pulse Rate 07/23/22 1531 63     Resp 07/23/22 1531 17     Temp 07/23/22 1531 98 F (36.7 C)     Temp src --      SpO2 07/23/22 1531 96 %     Weight --      Height --      Head Circumference --      Peak Flow --      Pain Score 07/23/22 1530 5     Pain Loc --      Pain Edu? --      Excl. in [place]? --     Most recent vital signs: Vitals:   07/23/22 2100 07/23/22 2150  BP: (!) 118/56 (!) 121/51  Pulse: (!) 57 60  Resp: 16 16  Temp: 97.9 F (36.6 C)   SpO2: 100% 100%    Physical Exam Constitutional:      Appearance: He is well-developed.  HENT:     Head: Atraumatic.     Comments: Dry mucous membranes Eyes:     Conjunctiva/sclera:  Conjunctivae normal.  Cardiovascular:     Rate and Rhythm: Regular rhythm.  Pulmonary:     Effort: No respiratory distress.  Musculoskeletal:     Cervical back: Normal range of motion.     Right lower leg: Edema present.     Comments: Right lower extremity with erythema and warmth.  No induration.  No crepitance.  Palpable DP pulse of the right foot.  Equal bilaterally.  Skin:    General: Skin is warm.     Capillary Refill: Capillary refill takes 2 to 3 seconds.  Neurological:     Mental Status: He is alert. Mental status is at baseline.     Comments: Ambulating with his walker without difficulty in the emergency department     IMPRESSION / MDM / Coffee / ED COURSE  I reviewed the triage vital signs and the nursing notes.  Differential diagnosis including DVT, cellulitis, heart failure exacerbation, dehydration  On chart review I can see that the patient had a blood pressure of  87/50 when at primary care provider office today.  In the emergency department blood pressure 107/58, afebrile and no tachycardia.  96% room air.  RADIOLOGY I independently reviewed imaging, my interpretation of imaging: Compressible study.  Read as no acute DVT.  LABS (all labs ordered are listed, but only abnormal results are displayed) Labs interpreted as -  No significant leukocytosis.  Initially and lactic acid elevated at 2.2 however repeat 1.5.  Anemia but hemoglobin close to baseline, appears to have a chronic decrease with no signs or symptoms of a GI bleed.  Creatinine appears to be at his baseline with no significant electrolyte abnormalities.  Labs Reviewed  CBC - Abnormal; Notable for the following components:      Result Value   RBC 3.52 (*)    Hemoglobin 10.4 (*)    HCT 32.7 (*)    All other components within normal limits  BASIC METABOLIC PANEL - Abnormal; Notable for the following components:   Glucose, Bld 106 (*)    Creatinine, Ser 2.33 (*)    GFR, Estimated 28 (*)     All other components within normal limits  LACTIC ACID, PLASMA - Abnormal; Notable for the following components:   Lactic Acid, Venous 2.2 (*)    All other components within normal limits  LACTIC ACID, PLASMA    TREATMENT    1 L of IV fluid  On reevaluation patient continues to have stable blood pressure, orthostatic blood pressures are negative and patient has a blood pressure of 121/51.  Patient states that he is feeling much better.  Able to ambulate in the emergency department without any lightheadedness or dizziness episode.  Likely patient's blood pressure was low secondary to his dehydration secondary to to decreasing his p.o. intake.  Given a dose of IV antibiotics with Ancef in the emergency department to cover for cellulitis.  Will start the patient on Keflex.  Discussed close follow-up with primary care physician and vascular surgery.  No concern for necrotizing soft tissue infection.  Palpable pulses to the right lower extremity.  Given strict return precautions for any worsening symptoms.  PROCEDURES:  Critical Care performed: No  Procedures  Patient's presentation is most consistent with acute presentation with potential threat to life or bodily function.   MEDICATIONS ORDERED IN ED: Medications  sodium chloride 0.9 % bolus 1,000 mL (0 mLs Intravenous Stopped 07/23/22 2150)  ceFAZolin (ANCEF) IVPB 2g/100 mL premix (0 g Intravenous Stopped 07/23/22 2045)    FINAL CLINICAL IMPRESSION(S) / ED DIAGNOSES   Final diagnoses:  Right leg pain  Cellulitis of right lower extremity  Dehydration     Rx / DC Orders   ED Discharge Orders          Ordered    cephALEXin (KEFLEX) 500 MG capsule  4 times daily        07/23/22 2114             Note:  This document was prepared using Dragon voice recognition software and may include unintentional dictation errors.   Nathaniel Man, MD 07/23/22 2317

## 2022-07-23 NOTE — Assessment & Plan Note (Signed)
Chronic, labile BP noted with significant decrease in past 2 days Followed by cardiology

## 2022-07-23 NOTE — Patient Instructions (Addendum)
Seek emergency care services given LE edema following procedure in setting of severe hypotension as well as CHF, CKD and Afib.

## 2022-07-23 NOTE — Assessment & Plan Note (Signed)
Acute, confirmed Patient denies concern for missed/extra medications Recommend ER visit for concern for infection given worsening edema in RLE following procedure Educated that BP is too low to ensure adequate organ perfusion and he already has a weak heart and impaired kidneys

## 2022-07-23 NOTE — Progress Notes (Signed)
I,Connie R Striblin,acting as a Education administrator for Gwyneth Sprout, FNP.,have documented all relevant documentation on the behalf of Gwyneth Sprout, FNP,as directed by  Gwyneth Sprout, FNP while in the presence of Gwyneth Sprout, FNP.  Established patient visit  Patient: [name] ACTON Sr.   DOB: September 11, 1940   82 y.o. Male  MRN: [id number] Visit Date: 07/23/2022  Today's healthcare provider: Gwyneth Sprout, FNP  Introduced to nurse practitioner role and practice setting.  All questions answered.  Discussed provider/patient relationship and expectations.  Subjective    HPI   Edema: Patient complains of edema . The location of the edema is the right calf.  The edema has been moderate.  Onset of symptoms was 1 week ago, gradually worsening since that time. The edema is present all day.   Pt presents with swelling and redness of the right calf. Patient denies pain, fever or chills. Would like drained today if possible.   Medications: Outpatient Medications Prior to Visit  Medication Sig   amiodarone (PACERONE) 200 MG tablet Take 1 tablet (200 mg total) by mouth daily.   aspirin EC 81 MG tablet Take 1 tablet (81 mg total) by mouth daily. Swallow whole.   fluticasone (FLONASE) 50 MCG/ACT nasal spray Place 2 sprays into both nostrils daily.   hydrochlorothiazide (MICROZIDE) 12.5 MG capsule Take 12.5 mg by mouth daily.   levothyroxine (SYNTHROID) 100 MCG tablet TAKE 1 TABLET BY MOUTH DAILY BEFORE BREAKFAST   lisinopril (ZESTRIL) 2.5 MG tablet Take 1 tablet (2.5 mg total) by mouth daily.   lovastatin (MEVACOR) 20 MG tablet TAKE 1 TABLET(20 MG) BY MOUTH DAILY   metoprolol succinate (TOPROL-XL) 25 MG 24 hr tablet 1 TABLET BY MOUTH ONCE A DAY (Patient taking differently: Take 12.5 mg by mouth daily. 1 TABLET BY MOUTH ONCE A DAY)   torsemide (DEMADEX) 20 MG tablet Take by mouth in the morning and at bedtime.   traMADol (ULTRAM) 50 MG tablet Take 50 mg by mouth 2 (two) times daily as needed.   XARELTO 20 MG  TABS tablet Take 1 tablet (20 mg total) by mouth daily.   gabapentin (NEURONTIN) 300 MG capsule Take 1 capsule (300 mg total) by mouth 2 (two) times daily.   No facility-administered medications prior to visit.    Review of Systems    Objective    BP (!) 84/37 (BP Location: Right Arm, Patient Position: Sitting, Cuff Size: Large)   Pulse (!) 52   Temp 98.3 F (36.8 C) (Oral)   Wt 245 lb (111.1 kg)   SpO2 96%   BMI 31.46 kg/m   Physical Exam Vitals and nursing note reviewed.  Constitutional:      General: He is not in acute distress.    Appearance: Normal appearance. He is obese. He is not ill-appearing, toxic-appearing or diaphoretic.  HENT:     Head: Normocephalic and atraumatic.  Eyes:     Pupils: Pupils are equal, round, and reactive to light.  Cardiovascular:     Rate and Rhythm: Normal rate and regular rhythm.  Pulmonary:     Effort: Pulmonary effort is normal.     Breath sounds: Normal breath sounds.  Musculoskeletal:        General: Swelling and tenderness present. Normal range of motion.     Cervical back: Normal range of motion.     Right lower leg: Edema present.     Left lower leg: Edema present.     Comments: RLE with  zip up compression; LLE wrapped  Skin:    General: Skin is warm and dry.     Capillary Refill: Capillary refill takes less than 2 seconds.     Findings: Erythema present.     Comments: BLE; worsening per pt report   Neurological:     General: No focal deficit present.     Mental Status: He is alert and oriented to person, place, and time. Mental status is at baseline.     Motor: Weakness present.     Comments: Use of walker  Psychiatric:        Mood and Affect: Mood normal.        Behavior: Behavior normal.        Thought Content: Thought content normal.        Judgment: Judgment normal.     No results found for any visits on 07/23/22.  Assessment & Plan     Problem List Items Addressed This Visit       Cardiovascular and  Mediastinum   Chronic systolic heart failure (HCC)    Chronic, labile BP noted with significant decrease in past 2 days Followed by cardiology       Hypotension - Primary    Acute, confirmed Patient denies concern for missed/extra medications Recommend ER visit for concern for infection given worsening edema in RLE following procedure Educated that BP is too low to ensure adequate organ perfusion and he already has a weak heart and impaired kidneys       Longstanding persistent atrial fibrillation (HCC)    Chronic, stable RC with AC on xarelto 20 mg Educated that it would be too dangerous to "open and drain" his leg in office given use of AC        Genitourinary   Stage 4 chronic kidney disease ([place])    Chronic, variable With hx of creatinine >2         Other   Elevated serum creatinine    Chronic, worsening CKD has advanced from 3b to 4      Seek emergent care now; patient declined transfer via EMS/Ambulance and reported that he will take himself if "we can't do anything."    I, Gwyneth Sprout, FNP, have reviewed all documentation for this visit. The documentation on 07/23/22 for the exam, diagnosis, procedures, and orders are all accurate and complete.  Gwyneth Sprout, [place] [phone] (phone) [phone] (fax)  [place]

## 2022-07-23 NOTE — ED Triage Notes (Signed)
Pt comes with c/o right leg pain for 4 days. Pt states he had vascular go in to right leg to place stents in left. Pt recently dc with wound clinic.   Pt also states his BP was low. Pt states pain in leg. Pt states skin is real tight. Pt states hx of cellulitis.   Pt has redness  swelling and warmth noted to right leg. Pt is on thinners.

## 2022-07-23 NOTE — Telephone Encounter (Signed)
  Chief Complaint: leg swelling Symptoms: L calf swelling and redness Frequency: 3-4 days  Pertinent Negatives: Patient denies pain in LLE Disposition: [] ED /[] Urgent Care (no appt availability in office) / [x] Appointment(In office/virtual)/ []  [facility]/ [] Home Care/ [] Refused Recommended Disposition /[] [facility]/ []  Follow-up with PCP Additional Notes: pt states had surgery and stents placed and now has had swelling for 3-4 days. Was here on 07/21/22 for OV and leg was fine then. Scheduled OV today at 1400 with Daneil Dan, NP and advised pt to elevated leg until then if possible.  Reason for Disposition  [1] Thigh, calf, or ankle swelling AND [2] only 1 side  Answer Assessment - Initial Assessment Questions 1. ONSET: "When did the swelling start?" (e.g., minutes, hours, days)     4 days  2. LOCATION: "What part of the leg is swollen?"  "Are both legs swollen or just one leg?"     LLE calf  3. SEVERITY: "How bad is the swelling?" (e.g., localized; mild, moderate, severe)   - Localized: Small area of swelling localized to one leg.   - MILD pedal edema: Swelling limited to foot and ankle, pitting edema < 1/4 inch (6 mm) deep, rest and elevation eliminate most or all swelling.   - MODERATE edema: Swelling of lower leg to knee, pitting edema > 1/4 inch (6 mm) deep, rest and elevation only partially reduce swelling.   - SEVERE edema: Swelling extends above knee, facial or hand swelling present.      Moderate  4. REDNESS: "Does the swelling look red or infected?"     Yes  5. PAIN: "Is the swelling painful to touch?" If Yes, ask: "How painful is it?"   (Scale 1-10; mild, moderate or severe)     0 9. RECURRENT SYMPTOM: "Have you had leg swelling before?" If Yes, ask: "When was the last time?" "What happened that time?"     Yes had issues previously  10. OTHER SYMPTOMS: "Do you have any other symptoms?" (e.g., chest pain, difficulty breathing)  Protocols used: Leg Swelling  and Edema-A-AH

## 2022-08-06 DIAGNOSIS — G8918 Other acute postprocedural pain: Secondary | ICD-10-CM | POA: Diagnosis not present

## 2022-08-06 DIAGNOSIS — R799 Abnormal finding of blood chemistry, unspecified: Secondary | ICD-10-CM | POA: Diagnosis not present

## 2022-08-06 DIAGNOSIS — N1832 Chronic kidney disease, stage 3b: Secondary | ICD-10-CM | POA: Diagnosis not present

## 2022-08-06 DIAGNOSIS — R82998 Other abnormal findings in urine: Secondary | ICD-10-CM | POA: Diagnosis not present

## 2022-08-06 DIAGNOSIS — E785 Hyperlipidemia, unspecified: Secondary | ICD-10-CM | POA: Diagnosis not present

## 2022-08-06 DIAGNOSIS — I10 Essential (primary) hypertension: Secondary | ICD-10-CM | POA: Diagnosis not present

## 2022-08-06 DIAGNOSIS — I4821 Permanent atrial fibrillation: Secondary | ICD-10-CM | POA: Diagnosis not present

## 2022-08-06 DIAGNOSIS — I509 Heart failure, unspecified: Secondary | ICD-10-CM | POA: Diagnosis not present

## 2022-08-06 DIAGNOSIS — R609 Edema, unspecified: Secondary | ICD-10-CM | POA: Diagnosis not present

## 2022-08-12 ENCOUNTER — Other Ambulatory Visit (INDEPENDENT_AMBULATORY_CARE_PROVIDER_SITE_OTHER): Payer: Self-pay | Admitting: Vascular Surgery

## 2022-08-12 DIAGNOSIS — I70222 Atherosclerosis of native arteries of extremities with rest pain, left leg: Secondary | ICD-10-CM

## 2022-08-12 DIAGNOSIS — Z01810 Encounter for preprocedural cardiovascular examination: Secondary | ICD-10-CM

## 2022-08-13 DIAGNOSIS — N1832 Chronic kidney disease, stage 3b: Secondary | ICD-10-CM | POA: Diagnosis not present

## 2022-08-13 DIAGNOSIS — R609 Edema, unspecified: Secondary | ICD-10-CM | POA: Diagnosis not present

## 2022-08-13 DIAGNOSIS — I7025 Atherosclerosis of native arteries of other extremities with ulceration: Secondary | ICD-10-CM | POA: Insufficient documentation

## 2022-08-13 DIAGNOSIS — I509 Heart failure, unspecified: Secondary | ICD-10-CM | POA: Diagnosis not present

## 2022-08-13 DIAGNOSIS — I10 Essential (primary) hypertension: Secondary | ICD-10-CM | POA: Diagnosis not present

## 2022-08-13 DIAGNOSIS — I4821 Permanent atrial fibrillation: Secondary | ICD-10-CM | POA: Diagnosis not present

## 2022-08-13 DIAGNOSIS — R82998 Other abnormal findings in urine: Secondary | ICD-10-CM | POA: Diagnosis not present

## 2022-08-13 DIAGNOSIS — E785 Hyperlipidemia, unspecified: Secondary | ICD-10-CM | POA: Diagnosis not present

## 2022-08-13 NOTE — Progress Notes (Signed)
MRN : [id number]  KAIRON PELOSO Sr. is a 82 y.o. (1941/05/10) male who presents with chief complaint of check circulation.  History of Present Illness:   The patient returns to the office for followup and review status post angiogram without intervention on 07/15/2022.   Procedure: Diagnostic left lower extremity angiography:  The left common femoral and profunda femoris are diffusely diseased but there are no hemodynamically significant stenoses noted.  The left common femoral demonstrates moderate calcific plaque of approximately 50 to 60%.  The superficial femoral and popliteal arteries are essentially occluded at multiple levels throughout their entire course.  There are small segments that reconstitute but then reocclude.  Trifurcation is profoundly diseased essentially occluded.  There is nonvisualization of the anterior tibial.  The tibioperoneal trunk appears to have hemodynamically significant stenosis.  At the origin of the posterior tibial there appears to be reconstitution from extensive profunda and geniculate collaterals and the posterior tibial artery is of good size and widely patent down to the foot filling the pedal arch.  The peroneal is faintly visualized but does not appear to be of good caliber.     The patient notes improvement in the lower extremity symptoms. No interval shortening of the patient's claudication distance or rest pain symptoms. No new ulcers or wounds have occurred since the last visit.  There have been no significant changes to the patient's overall health care.  No documented history of amaurosis fugax or recent TIA symptoms. There are no recent neurological changes noted. No documented history of DVT, PE or superficial thrombophlebitis. The patient denies recent episodes of angina or shortness of breath.    No outpatient medications have been marked as taking for the 08/14/22 encounter (Appointment) with Delana Meyer, Dolores Lory, MD.     Past Medical History:  Diagnosis Date   Allergy    History of chicken pox    Hyperlipidemia    Hypertension     Past Surgical History:  Procedure Laterality Date   BACK SURGERY     x5   DOPPLER ECHOCARDIOGRAPHY  06/21/2013   EF=35% while in Sinus Rhythm   LOWER EXTREMITY ANGIOGRAPHY Right 05/20/2022   Procedure: Lower Extremity Angiography;  Surgeon: Katha Cabal, MD;  Location: [facility];  Service: Cardiovascular;  Laterality: Right;   LOWER EXTREMITY ANGIOGRAPHY Left 07/15/2022   Procedure: Lower Extremity Angiography;  Surgeon: Katha Cabal, MD;  Location: [facility];  Service: Cardiovascular;  Laterality: Left;   Myocardial perfusion scan  10/2012   severe global LV enlargement. Mild RV enlargement. Mild LVH. Mild mitral and tricuspid insufficency   polyp excision  09/2005   UPPER GASTROINTESTINAL ENDOSCOPY  03/27/2004   Gastropathy, Gastritis; Duodenopathy    Social History Social History   Tobacco Use   Smoking status: Former    Packs/day: 2.00    Years: 50.00    Total pack years: 100.00    Types: Cigarettes    Quit date: 07/01/2007    Years since quitting: 15.1   Smokeless tobacco: Never  Vaping Use   Vaping Use: Never used  Substance Use Topics   Alcohol use: No    Alcohol/week: 0.0 standard drinks of alcohol   Drug use: No    Family History Family History  Problem Relation Age of Onset   Alzheimer's disease Mother    Heart attack Father    Colon cancer Brother  No Known Allergies   REVIEW OF SYSTEMS (Negative unless checked)  Constitutional: []$ Weight loss  []$ Fever  []$ Chills Cardiac: []$ Chest pain   []$ Chest pressure   []$ Palpitations   []$ Shortness of breath when laying flat   []$ Shortness of breath with exertion. Vascular:  [x]$ Pain in legs with walking   []$ Pain in legs at rest  []$ History of DVT   []$ Phlebitis   [x]$ Swelling in legs   []$ Varicose veins   [x]$ Non-healing ulcers Pulmonary:   []$ Uses home oxygen    []$ Productive cough   []$ Hemoptysis   []$ Wheeze  []$ COPD   []$ Asthma Neurologic:  []$ Dizziness   []$ Seizures   []$ History of stroke   []$ History of TIA  []$ Aphasia   []$ Vissual changes   []$ Weakness or numbness in arm   [x]$ Weakness or numbness in leg Musculoskeletal:   []$ Joint swelling   [x]$ Joint pain   [x]$ Low back pain Hematologic:  []$ Easy bruising  []$ Easy bleeding   []$ Hypercoagulable state   []$ Anemic Gastrointestinal:  []$ Diarrhea   []$ Vomiting  []$ Gastroesophageal reflux/heartburn   []$ Difficulty swallowing. Genitourinary:  [x]$ Chronic kidney disease   []$ Difficult urination  []$ Frequent urination   []$ Blood in urine Skin:  []$ Rashes   [x]$ Ulcers  Psychological:  []$ History of anxiety   []$  History of major depression.  Physical Examination  There were no vitals filed for this visit. There is no height or weight on file to calculate BMI. Gen: WD/WN, NAD Head: [place]/AT, No temporalis wasting.  Ear/Nose/Throat: Hearing grossly intact, nares w/o erythema or drainage Eyes: PER, EOMI, sclera nonicteric.  Neck: Supple, no masses.  No bruit or JVD.  Pulmonary:  Good air movement, no audible wheezing, no use of accessory muscles.  Cardiac: RRR, normal S1, S2, no Murmurs. Vascular:  mild trophic changes,  open wound left foot noninfected Vessel Right Left  Radial Palpable Palpable  PT Not Palpable Not Palpable  DP Not Palpable Not Palpable  Gastrointestinal: soft, non-distended. No guarding/no peritoneal signs.  Musculoskeletal: M/S 5/5 throughout.  No visible deformity.  Neurologic: CN 2-12 intact. Pain and light touch intact in extremities.  Symmetrical.  Speech is fluent. Motor exam as listed above. Psychiatric: Judgment intact, Mood & affect appropriate for pt's clinical situation. Dermatologic: No rashes or ulcers noted.  No changes consistent with cellulitis.   CBC Lab Results  Component Value Date   WBC 7.2 07/23/2022   HGB 10.4 (L) 07/23/2022   HCT 32.7 (L) 07/23/2022   MCV 92.9 07/23/2022   PLT 305  07/23/2022    BMET    Component Value Date/Time   NA 137 07/23/2022 1532   NA 140 01/07/2022 0925   K 4.3 07/23/2022 1532   CL 103 07/23/2022 1532   CO2 26 07/23/2022 1532   GLUCOSE 106 (H) 07/23/2022 1532   BUN 22 07/23/2022 1532   BUN 32 (H) 01/07/2022 0925   CREATININE 2.33 (H) 07/23/2022 1532   CREATININE 1.71 (H) 03/19/2017 0813   CALCIUM 9.0 07/23/2022 1532   GFRNONAA 28 (L) 07/23/2022 1532   GFRNONAA 39 (L) 03/19/2017 0813   GFRAA 39 (L) 04/04/2020 0808   GFRAA 45 (L) 03/19/2017 0813   CrCl cannot be calculated (Unknown ideal weight.).  COAG Lab Results  Component Value Date   INR 1.4 (H) 11/26/2021    Radiology US Venous Img Lower Unilateral Right  Result Date: 07/23/2022 CLINICAL DATA:  Right lower extremity swelling EXAM: RIGHT LOWER EXTREMITY VENOUS DOPPLER ULTRASOUND TECHNIQUE: Gray-scale sonography with compression, as well as color and duplex ultrasound, were performed to evaluate the deep venous system(s)  from the level of the common femoral vein through the popliteal and proximal calf veins. COMPARISON:  None Available. FINDINGS: VENOUS Normal compressibility of the common femoral, superficial femoral, and popliteal veins, as well as the visualized calf veins. Visualized portions of profunda femoral vein and great saphenous vein unremarkable. No filling defects to suggest DVT on grayscale or color Doppler imaging. Doppler waveforms show normal direction of venous flow, normal respiratory plasticity and response to augmentation. Limited views of the contralateral common femoral vein are unremarkable. OTHER None. Limitations: none IMPRESSION: No evidence of DVT in the right lower extremity Electronically Signed   By: Yetta Glassman M.D.   On: 07/23/2022 16:35   PERIPHERAL VASCULAR CATHETERIZATION  Result Date: 07/15/2022 See surgical note for result.    Assessment/Plan 1. Atherosclerosis of native arteries of the extremities with ulceration ([place])   Recommend:  The patient has evidence of severe atherosclerotic changes of both lower extremities associated with ulceration and tissue loss of the left foot.  This represents a limb threatening ischemia and places the patient at a high risk for limb loss.  Angiography has been performed and the situation is not ideal for intervention.  Given this finding open surgical repair is recommended.   Patient should undergo arterial reconstruction, femoral to posterior tibial bypass with in situ GSV of the left lower extremity with the hope for limb salvage.  The risks and benefits as well as the alternative therapies was discussed in detail with the patient.  All questions were answered.  Patient agrees to proceed with open vascular surgical reconstruction.  The patient will follow up with me in the office after the procedure.    2. Edema, unspecified type Recommend:  No surgery or intervention at this point in time.    I have reviewed my discussion with the patient regarding lymphedema and why it  causes symptoms.  Patient will continue wearing graduated compression on a daily basis. The patient should put the compression on first thing in the morning and removing them in the evening. The patient should not sleep in the compression.   In addition, behavioral modification throughout the day will be continued.  This will include frequent elevation (such as in a recliner), use of over the counter pain medications as needed and exercise such as walking.  The systemic causes for chronic edema such as liver, kidney and cardiac etiologies does not appear to have significant changed over the past year.    The patient will continue aggressive use of the  lymph pump.  This will continue to improve the edema control and prevent sequela such as ulcers and infections.   The patient will follow-up with me on an annual basis.   3. Primary hypertension Continue antihypertensive medications as already ordered,  these medications have been reviewed and there are no changes at this time. - Ambulatory referral to Cardiology  4. Longstanding persistent atrial fibrillation (HCC) Continue antiarrhythmia medications as already ordered, these medications have been reviewed and there are no changes at this time.  Continue anticoagulation as ordered by Cardiology Service - Ambulatory referral to Cardiology  5. Lumbar radiculopathy, chronic Continue NSAID medications as already ordered, these medications have been reviewed and there are no changes at this time.  Continued activity and therapy was stressed.   Hortencia Pilar, MD  08/13/2022 2:45 PM

## 2022-08-14 ENCOUNTER — Ambulatory Visit (INDEPENDENT_AMBULATORY_CARE_PROVIDER_SITE_OTHER): Payer: PPO | Admitting: Vascular Surgery

## 2022-08-14 ENCOUNTER — Ambulatory Visit (INDEPENDENT_AMBULATORY_CARE_PROVIDER_SITE_OTHER): Payer: PPO

## 2022-08-14 VITALS — BP 122/60 | HR 56 | Resp 18 | Ht 74.0 in | Wt 240.0 lb

## 2022-08-14 DIAGNOSIS — I4811 Longstanding persistent atrial fibrillation: Secondary | ICD-10-CM | POA: Diagnosis not present

## 2022-08-14 DIAGNOSIS — Z01810 Encounter for preprocedural cardiovascular examination: Secondary | ICD-10-CM

## 2022-08-14 DIAGNOSIS — I70222 Atherosclerosis of native arteries of extremities with rest pain, left leg: Secondary | ICD-10-CM

## 2022-08-14 DIAGNOSIS — R609 Edema, unspecified: Secondary | ICD-10-CM

## 2022-08-14 DIAGNOSIS — I10 Essential (primary) hypertension: Secondary | ICD-10-CM

## 2022-08-14 DIAGNOSIS — M5416 Radiculopathy, lumbar region: Secondary | ICD-10-CM | POA: Diagnosis not present

## 2022-08-14 DIAGNOSIS — I7025 Atherosclerosis of native arteries of other extremities with ulceration: Secondary | ICD-10-CM | POA: Diagnosis not present

## 2022-08-17 ENCOUNTER — Encounter (INDEPENDENT_AMBULATORY_CARE_PROVIDER_SITE_OTHER): Payer: Self-pay | Admitting: Vascular Surgery

## 2022-08-25 ENCOUNTER — Telehealth: Payer: Self-pay | Admitting: Family Medicine

## 2022-08-25 NOTE — Telephone Encounter (Signed)
Contacted [name] Sr. to schedule their annual wellness visit. Appointment made for 10/01/2022.  [facility] [facility] Direct Dial: [phone]

## 2022-09-09 ENCOUNTER — Telehealth (INDEPENDENT_AMBULATORY_CARE_PROVIDER_SITE_OTHER): Payer: Self-pay

## 2022-09-09 NOTE — Telephone Encounter (Signed)
Patient was scheduled on 09/17/22 for a left femoral pos tibial bypass with Dr. Delana Meyer at the [facility]. Pre-op phone call on 09/10/22. Patient has called back wanting to be rescheduled to 10/22/22 to have his surgery. Patient's pre-op has been rescheduled to 10/13/22 with between 1-5 pm. Pre-procedure instructions were discussed and will be mailed.

## 2022-09-10 ENCOUNTER — Inpatient Hospital Stay: Admission: RE | Admit: 2022-09-10 | Payer: PPO | Source: Ambulatory Visit

## 2022-10-01 ENCOUNTER — Ambulatory Visit (INDEPENDENT_AMBULATORY_CARE_PROVIDER_SITE_OTHER): Payer: PPO

## 2022-10-01 VITALS — Ht 74.0 in | Wt 240.0 lb

## 2022-10-01 DIAGNOSIS — Z0000 Encounter for general adult medical examination without abnormal findings: Secondary | ICD-10-CM | POA: Diagnosis not present

## 2022-10-01 NOTE — Patient Instructions (Signed)
[name] , Thank you for taking time to come for your Medicare Wellness Visit. I appreciate your ongoing commitment to your health goals. Please review the following plan we discussed and let me know if I can assist you in the future.   These are the goals we discussed:  Goals      DIET - EAT MORE FRUITS AND VEGETABLES     DIET - REDUCE SUGAR INTAKE     Recommend to decrease or completely cut out all extra sugars in diet.         This is a list of the screening recommended for you and due dates:  Health Maintenance  Topic Date Due   COVID-19 Vaccine (1) Never done   Zoster (Shingles) Vaccine (1 of 2) Never done   Flu Shot  01/29/2023   Medicare Annual Wellness Visit  10/01/2023   DTaP/Tdap/Td vaccine (2 - Td or Tdap) 04/21/2024   Pneumonia Vaccine  Completed   HPV Vaccine  Aged Out    Advanced directives: yes  Conditions/risks identified: low falls risk  Next appointment: Follow up in one year for your annual wellness visit. 10/06/2023 @10 :45 am telephone  Preventive Care 65 Years and Older, Male  Preventive care refers to lifestyle choices and visits with your health care provider that can promote health and wellness. What does preventive care include? A yearly physical exam. This is also called an annual well check. Dental exams once or twice a year. Routine eye exams. Ask your health care provider how often you should have your eyes checked. Personal lifestyle choices, including: Daily care of your teeth and gums. Regular physical activity. Eating a healthy diet. Avoiding tobacco and drug use. Limiting alcohol use. Practicing safe sex. Taking low doses of aspirin every day. Taking vitamin and mineral supplements as recommended by your health care provider. What happens during an annual well check? The services and screenings done by your health care provider during your annual well check will depend on your age, overall health, lifestyle risk factors, and family  history of disease. Counseling  Your health care provider may ask you questions about your: Alcohol use. Tobacco use. Drug use. Emotional well-being. Home and relationship well-being. Sexual activity. Eating habits. History of falls. Memory and ability to understand (cognition). Work and work Statistician. Screening  You may have the following tests or measurements: Height, weight, and BMI. Blood pressure. Lipid and cholesterol levels. These may be checked every 5 years, or more frequently if you are over 3 years old. Skin check. Lung cancer screening. You may have this screening every year starting at age 63 if you have a 30-pack-year history of smoking and currently smoke or have quit within the past 15 years. Fecal occult blood test (FOBT) of the stool. You may have this test every year starting at age 36. Flexible sigmoidoscopy or colonoscopy. You may have a sigmoidoscopy every 5 years or a colonoscopy every 10 years starting at age 20. Prostate cancer screening. Recommendations will vary depending on your family history and other risks. Hepatitis C blood test. Hepatitis B blood test. Sexually transmitted disease (STD) testing. Diabetes screening. This is done by checking your blood sugar (glucose) after you have not eaten for a while (fasting). You may have this done every 1-3 years. Abdominal aortic aneurysm (AAA) screening. You may need this if you are a current or former smoker. Osteoporosis. You may be screened starting at age 86 if you are at high risk. Talk with your health  care provider about your test results, treatment options, and if necessary, the need for more tests. Vaccines  Your health care provider may recommend certain vaccines, such as: Influenza vaccine. This is recommended every year. Tetanus, diphtheria, and acellular pertussis (Tdap, Td) vaccine. You may need a Td booster every 10 years. Zoster vaccine. You may need this after age 41. Pneumococcal  13-valent conjugate (PCV13) vaccine. One dose is recommended after age 57. Pneumococcal polysaccharide (PPSV23) vaccine. One dose is recommended after age 39. Talk to your health care provider about which screenings and vaccines you need and how often you need them. This information is not intended to replace advice given to you by your health care provider. Make sure you discuss any questions you have with your health care provider. Document Released: 07/13/2015 Document Revised: 03/05/2016 Document Reviewed: 04/17/2015 Elsevier Interactive Patient Education  2017 [employer] Prevention in the Home Falls can cause injuries. They can happen to people of all ages. There are many things you can do to make your home safe and to help prevent falls. What can I do on the outside of my home? Regularly fix the edges of walkways and driveways and fix any cracks. Remove anything that might make you trip as you walk through a door, such as a raised step or threshold. Trim any bushes or trees on the path to your home. Use bright outdoor lighting. Clear any walking paths of anything that might make someone trip, such as rocks or tools. Regularly check to see if handrails are loose or broken. Make sure that both sides of any steps have handrails. Any raised decks and porches should have guardrails on the edges. Have any leaves, snow, or ice cleared regularly. Use sand or salt on walking paths during winter. Clean up any spills in your garage right away. This includes oil or grease spills. What can I do in the bathroom? Use night lights. Install grab bars by the toilet and in the tub and shower. Do not use towel bars as grab bars. Use non-skid mats or decals in the tub or shower. If you need to sit down in the shower, use a plastic, non-slip stool. Keep the floor dry. Clean up any water that spills on the floor as soon as it happens. Remove soap buildup in the tub or shower regularly. Attach  bath mats securely with double-sided non-slip rug tape. Do not have throw rugs and other things on the floor that can make you trip. What can I do in the bedroom? Use night lights. Make sure that you have a light by your bed that is easy to reach. Do not use any sheets or blankets that are too big for your bed. They should not hang down onto the floor. Have a firm chair that has side arms. You can use this for support while you get dressed. Do not have throw rugs and other things on the floor that can make you trip. What can I do in the kitchen? Clean up any spills right away. Avoid walking on wet floors. Keep items that you use a lot in easy-to-reach places. If you need to reach something above you, use a strong step stool that has a grab bar. Keep electrical cords out of the way. Do not use floor polish or wax that makes floors slippery. If you must use wax, use non-skid floor wax. Do not have throw rugs and other things on the floor that can make you trip. What can  I do with my stairs? Do not leave any items on the stairs. Make sure that there are handrails on both sides of the stairs and use them. Fix handrails that are broken or loose. Make sure that handrails are as long as the stairways. Check any carpeting to make sure that it is firmly attached to the stairs. Fix any carpet that is loose or worn. Avoid having throw rugs at the top or bottom of the stairs. If you do have throw rugs, attach them to the floor with carpet tape. Make sure that you have a light switch at the top of the stairs and the bottom of the stairs. If you do not have them, ask someone to add them for you. What else can I do to help prevent falls? Wear shoes that: Do not have high heels. Have rubber bottoms. Are comfortable and fit you well. Are closed at the toe. Do not wear sandals. If you use a stepladder: Make sure that it is fully opened. Do not climb a closed stepladder. Make sure that both sides of the  stepladder are locked into place. Ask someone to hold it for you, if possible. Clearly mark and make sure that you can see: Any grab bars or handrails. First and last steps. Where the edge of each step is. Use tools that help you move around (mobility aids) if they are needed. These include: Canes. Walkers. Scooters. Crutches. Turn on the lights when you go into a dark area. Replace any light bulbs as soon as they burn out. Set up your furniture so you have a clear path. Avoid moving your furniture around. If any of your floors are uneven, fix them. If there are any pets around you, be aware of where they are. Review your medicines with your doctor. Some medicines can make you feel dizzy. This can increase your chance of falling. Ask your doctor what other things that you can do to help prevent falls. This information is not intended to replace advice given to you by your health care provider. Make sure you discuss any questions you have with your health care provider. Document Released: 04/12/2009 Document Revised: 11/22/2015 Document Reviewed: 07/21/2014 Elsevier Interactive Patient Education  2017 [employer].

## 2022-10-01 NOTE — Progress Notes (Signed)
I connected with  [name] JUDEH Sr. on 10/01/22 by a audio enabled telemedicine application and verified that I am speaking with the correct person using two identifiers.  Patient Location: Home  Provider Location: Office/Clinic  I discussed the limitations of evaluation and management by telemedicine. The patient expressed understanding and agreed to proceed.  Subjective:   [name] HARNESS Sr. is a 82 y.o. male who presents for Medicare Annual/Subsequent preventive examination.  Review of Systems    Cardiac Risk Factors include: advanced age (>40men, >62 women);hypertension;sedentary lifestyle;male gender;dyslipidemia;obesity (BMI >30kg/m2)    Objective:    Today's Vitals   10/01/22 1108 10/01/22 1110  Weight: 240 lb (108.9 kg)   Height: 6\' 2"  (1.88 m)   PainSc:  5    Body mass index is 30.81 kg/m.     10/01/2022   11:23 AM 07/23/2022    3:31 PM 07/15/2022   10:09 AM 11/26/2021   10:31 AM 10/28/2021    4:23 PM 09/02/2021    8:30 AM 02/23/2020    8:23 AM  Advanced Directives  Does Patient Have a Medical Advance Directive? Yes Yes Yes No No;Yes No Yes  Type of Corporate treasurer of [place];Living will Living will;Healthcare Power of Attorney  Living will;Healthcare Power of Attorney    Does patient want to make changes to medical advance directive?    No - Patient declined No - Patient declined    Copy of [place] in Chart?     No - copy requested    Would patient like information on creating a medical advance directive?    No - Patient declined No - Patient declined No - Patient declined     Current Medications (verified) Outpatient Encounter Medications as of 10/01/2022  Medication Sig   amiodarone (PACERONE) 200 MG tablet Take 1 tablet (200 mg total) by mouth daily.   aspirin EC 81 MG tablet Take 1 tablet (81 mg total) by mouth daily. Swallow whole.   fluticasone (FLONASE) 50 MCG/ACT nasal spray Place 2 sprays into both nostrils daily.    gabapentin (NEURONTIN) 300 MG capsule Take 1 capsule (300 mg total) by mouth 2 (two) times daily.   hydrochlorothiazide (MICROZIDE) 12.5 MG capsule Take 12.5 mg by mouth daily.   levothyroxine (SYNTHROID) 100 MCG tablet TAKE 1 TABLET BY MOUTH DAILY BEFORE BREAKFAST   lisinopril (ZESTRIL) 2.5 MG tablet Take 1 tablet (2.5 mg total) by mouth daily.   lovastatin (MEVACOR) 20 MG tablet TAKE 1 TABLET(20 MG) BY MOUTH DAILY   metoprolol succinate (TOPROL-XL) 25 MG 24 hr tablet 1 TABLET BY MOUTH ONCE A DAY (Patient not taking: Reported on 08/14/2022)   torsemide (DEMADEX) 20 MG tablet Take by mouth in the morning and at bedtime.   traMADol (ULTRAM) 50 MG tablet Take 50 mg by mouth 2 (two) times daily as needed.   XARELTO 20 MG TABS tablet Take 1 tablet (20 mg total) by mouth daily.   No facility-administered encounter medications on file as of 10/01/2022.    Allergies (verified) Patient has no known allergies.   History: Past Medical History:  Diagnosis Date   Allergy    History of chicken pox    Hyperlipidemia    Hypertension    Past Surgical History:  Procedure Laterality Date   BACK SURGERY     x5   DOPPLER ECHOCARDIOGRAPHY  06/21/2013   EF=35% while in Sinus Rhythm   LOWER EXTREMITY ANGIOGRAPHY Right 05/20/2022   Procedure: Lower Extremity  Angiography;  Surgeon: Katha Cabal, MD;  Location: [facility];  Service: Cardiovascular;  Laterality: Right;   LOWER EXTREMITY ANGIOGRAPHY Left 07/15/2022   Procedure: Lower Extremity Angiography;  Surgeon: Katha Cabal, MD;  Location: [facility];  Service: Cardiovascular;  Laterality: Left;   Myocardial perfusion scan  10/2012   severe global LV enlargement. Mild RV enlargement. Mild LVH. Mild mitral and tricuspid insufficency   polyp excision  09/2005   UPPER GASTROINTESTINAL ENDOSCOPY  03/27/2004   Gastropathy, Gastritis; Duodenopathy   Family History  Problem Relation Age of Onset   Alzheimer's disease Mother     Heart attack Father    Colon cancer Brother    Social History   Socioeconomic History   Marital status: Widowed    Spouse name: Not on file   Number of children: 2   Years of education: Not on file   Highest education level: 12th grade  Occupational History   Occupation: Retired/ Disabled  Tobacco Use   Smoking status: Former    Packs/day: 2.00    Years: 50.00    Additional pack years: 0.00    Total pack years: 100.00    Types: Cigarettes    Quit date: 07/01/2007    Years since quitting: 15.2   Smokeless tobacco: Never  Vaping Use   Vaping Use: Never used  Substance and Sexual Activity   Alcohol use: No    Alcohol/week: 0.0 standard drinks of alcohol   Drug use: No   Sexual activity: Not on file  Other Topics Concern   Not on file  Social History Narrative   Not on file   Social Determinants of Health   Financial Resource Strain: Low Risk  (10/01/2022)   Overall Financial Resource Strain (CARDIA)    Difficulty of Paying Living Expenses: Not hard at all  Food Insecurity: No Food Insecurity (09/02/2021)   Hunger Vital Sign    Worried About Running Out of Food in the Last Year: Never true    Ran Out of Food in the Last Year: Never true  Transportation Needs: No Transportation Needs (10/01/2022)   PRAPARE - Hydrologist (Medical): No    Lack of Transportation (Non-Medical): No  Physical Activity: Inactive (10/01/2022)   Exercise Vital Sign    Days of Exercise per Week: 0 days    Minutes of Exercise per Session: 0 min  Stress: No Stress Concern Present (10/01/2022)   [place]    Feeling of Stress : Not at all  Social Connections: Socially Isolated (10/01/2022)   Social Connection and Isolation Panel [NHANES]    Frequency of Communication with Friends and Family: More than three times a week    Frequency of Social Gatherings with Friends and Family: Once a week    Attends  Religious Services: Never    Marine scientist or Organizations: No    Attends Archivist Meetings: Never    Marital Status: Widowed    Tobacco Counseling Counseling given: Not Answered   Clinical Intake:  Pre-visit preparation completed: Yes  Pain : 0-10 Pain Score: 5  Pain Type: Chronic pain Pain Location: Generalized Pain Descriptors / Indicators: Aching Pain Onset: More than a month ago Pain Frequency: Constant Pain Relieving Factors: medications  Pain Relieving Factors: medications  BMI - recorded: 30.81 Nutritional Status: BMI > 30  Obese Nutritional Risks: None Diabetes: No  How often do you need  to have someone help you when you read instructions, pamphlets, or other written materials from your doctor or pharmacy?: 1 - Never  Diabetic?no  Interpreter Needed?: No  Comments: lives with son Information entered by :: B.Lorin Gawron,LPN   Activities of Daily Living    10/01/2022   11:23 AM 07/21/2022    1:25 PM  In your present state of health, do you have any difficulty performing the following activities:  Hearing? 0 0  Vision? 0 0  Difficulty concentrating or making decisions? 0 0  Walking or climbing stairs? 1 0  Dressing or bathing? 0 0  Doing errands, shopping? 1 0  Preparing Food and eating ? N   Using the Toilet? N   In the past six months, have you accidently leaked urine? N   Do you have problems with loss of bowel control? N   Managing your Medications? N   Managing your Finances? N   Housekeeping or managing your Housekeeping? Y   Comment lives with son     Patient Care Team: Eulis Foster, MD as PCP - General (Family Medicine) Isaias Cowman, MD as Consulting Physician (Cardiology) Gillis Santa, MD as Consulting Physician (Pain Medicine) Neldon Labella, RN as Case Manager Lyla Son, MD as Consulting Physician (Nephrology) Delana Meyer, Dolores Lory, MD (Vascular Surgery)  Indicate any recent Medical  Services you may have received from other than [facility] providers in the past year (date may be approximate).     Assessment:   This is a routine wellness examination for Hanish.  Hearing/Vision screen Hearing Screening - Comments:: Adequate hearing Vision Screening - Comments:: Adequate vision w/glasses Rt eye cataract present but not impairing vision [facility]  Dietary issues and exercise activities discussed: Current Exercise Habits: The patient does not participate in regular exercise at present, Exercise limited by: cardiac condition(s);orthopedic condition(s)   Goals Addressed             This Visit's Progress    DIET - EAT MORE FRUITS AND VEGETABLES   On track    DIET - REDUCE SUGAR INTAKE   On track    Recommend to decrease or completely cut out all extra sugars in diet.        Depression Screen    10/01/2022   11:18 AM 07/21/2022    1:24 PM 09/02/2021    8:28 AM 06/12/2021    9:24 AM 03/07/2021    2:42 PM 02/11/2021    2:07 PM 02/20/2020   11:03 AM  PHQ 2/9 Scores  PHQ - 2 Score 0 0 0 0 0 0 0  PHQ- 9 Score  0  0 0      Fall Risk    10/01/2022   11:15 AM 09/02/2021    8:31 AM 08/13/2021   10:35 AM 03/07/2021    2:42 PM 02/11/2021    2:07 PM  [place] in the past year? 0 0 0 0 0  Number falls in past yr: 0 0 0 0 0  Injury with Fall? 0 0  0   Risk for fall due to : No Fall Risks No Fall Risks No Fall Risks No Fall Risks   Follow up Education provided;Falls prevention discussed Falls evaluation completed Falls evaluation completed Falls evaluation completed Falls prevention discussed    FALL RISK PREVENTION PERTAINING TO THE HOME:  Any stairs in or around the home? No  If so, are there any without handrails? No  Home free of loose throw rugs in walkways,  pet beds, electrical cords, etc? Yes  Adequate lighting in your home to reduce risk of falls? Yes   ASSISTIVE DEVICES UTILIZED TO PREVENT FALLS:  Life alert? No  Use of a cane, walker or w/c? Yes  walker Grab bars in the bathroom? Yes  Shower chair or bench in shower? Yes  Elevated toilet seat or a handicapped toilet? Yes    Cognitive Function:        10/01/2022   11:25 AM  6CIT Screen  What Year? 0 points  What month? 0 points  What time? 0 points  Count back from 20 0 points  Months in reverse 0 points  Repeat phrase 0 points  Total Score 0 points    Immunizations Immunization History  Administered Date(s) Administered   Fluad Quad(high Dose 65+) 04/03/2020, 04/16/2022   Influenza Split 03/22/2012   Influenza, High Dose Seasonal PF 05/23/2014, 06/04/2015, 03/18/2017, 03/17/2018   Pneumococcal Conjugate-13 05/23/2014   Pneumococcal Polysaccharide-23 06/04/2015   Tdap 04/21/2014    TDAP status: Up to date  Flu Vaccine status: Up to date  Pneumococcal vaccine status: Up to date  Covid-19 vaccine status: Declined, Education has been provided regarding the importance of this vaccine but patient still declined. Advised may receive this vaccine at local pharmacy or Health Dept.or vaccine clinic. Aware to provide a copy of the vaccination record if obtained from local pharmacy or Health Dept. Verbalized acceptance and understanding.  Qualifies for Shingles Vaccine? Yes   Zostavax completed No   Shingrix Completed?: No.    Education has been provided regarding the importance of this vaccine. Patient has been advised to call insurance company to determine out of pocket expense if they have not yet received this vaccine. Advised may also receive vaccine at local pharmacy or Health Dept. Verbalized acceptance and understanding.  Screening Tests Health Maintenance  Topic Date Due   COVID-19 Vaccine (1) Never done   Zoster Vaccines- Shingrix (1 of 2) Never done   INFLUENZA VACCINE  01/29/2023   Medicare Annual Wellness (AWV)  10/01/2023   DTaP/Tdap/Td (2 - Td or Tdap) 04/21/2024   Pneumonia Vaccine 76+ Years old  Completed   HPV VACCINES  Aged Out    Health  Maintenance  Health Maintenance Due  Topic Date Due   COVID-19 Vaccine (1) Never done   Zoster Vaccines- Shingrix (1 of 2) Never done    Colorectal cancer screening: No longer required.   Lung Cancer Screening: (Low Dose CT Chest recommended if Age 24-80 years, 30 pack-year currently smoking OR have quit w/in 15years.) does not qualify.   Lung Cancer Screening Referral: no  Additional Screening:  Hepatitis C Screening: does not qualify; Completed yes  Vision Screening: Recommended annual ophthalmology exams for early detection of glaucoma and other disorders of the eye. Is the patient up to date with their annual eye exam?  Yes  Who is the provider or what is the name of the office in which the patient attends annual eye exams? [facility] If pt is not established with a provider, would they like to be referred to a provider to establish care? No .   Dental Screening: Recommended annual dental exams for proper oral hygiene  Community Resource Referral / Chronic Care Management: CRR required this visit?  No   CCM required this visit?  No      Plan:     I have personally reviewed and noted the following in the patient's chart:   Medical and social history Use  of alcohol, tobacco or illicit drugs  Current medications and supplements including opioid prescriptions. Patient is not currently taking opioid prescriptions. Functional ability and status Nutritional status Physical activity Advanced directives List of other physicians Hospitalizations, surgeries, and ER visits in previous 12 months Vitals Screenings to include cognitive, depression, and falls Referrals and appointments  In addition, I have reviewed and discussed with patient certain preventive protocols, quality metrics, and best practice recommendations. A written personalized care plan for preventive services as well as general preventive health recommendations were provided to patient.     Roger Shelter, LPN   [id number]   Nurse Notes: pt states he is anticipating pre-op testing for cardiac bypass surgery tentatively on 10/22/2022. He has no concerns or questions at this time.

## 2022-10-08 DIAGNOSIS — R799 Abnormal finding of blood chemistry, unspecified: Secondary | ICD-10-CM | POA: Diagnosis not present

## 2022-10-08 DIAGNOSIS — N1832 Chronic kidney disease, stage 3b: Secondary | ICD-10-CM | POA: Diagnosis not present

## 2022-10-08 DIAGNOSIS — I4821 Permanent atrial fibrillation: Secondary | ICD-10-CM | POA: Diagnosis not present

## 2022-10-08 DIAGNOSIS — E785 Hyperlipidemia, unspecified: Secondary | ICD-10-CM | POA: Diagnosis not present

## 2022-10-08 DIAGNOSIS — R609 Edema, unspecified: Secondary | ICD-10-CM | POA: Diagnosis not present

## 2022-10-08 DIAGNOSIS — R82998 Other abnormal findings in urine: Secondary | ICD-10-CM | POA: Diagnosis not present

## 2022-10-08 DIAGNOSIS — I10 Essential (primary) hypertension: Secondary | ICD-10-CM | POA: Diagnosis not present

## 2022-10-08 DIAGNOSIS — G8918 Other acute postprocedural pain: Secondary | ICD-10-CM | POA: Diagnosis not present

## 2022-10-08 DIAGNOSIS — I509 Heart failure, unspecified: Secondary | ICD-10-CM | POA: Diagnosis not present

## 2022-10-13 ENCOUNTER — Other Ambulatory Visit: Payer: Self-pay

## 2022-10-13 ENCOUNTER — Other Ambulatory Visit (INDEPENDENT_AMBULATORY_CARE_PROVIDER_SITE_OTHER): Payer: Self-pay | Admitting: Nurse Practitioner

## 2022-10-13 ENCOUNTER — Encounter
Admission: RE | Admit: 2022-10-13 | Discharge: 2022-10-13 | Disposition: A | Discharge status: Home / Self Care | Payer: PPO | Source: Ambulatory Visit | Attending: Vascular Surgery | Admitting: Vascular Surgery

## 2022-10-13 DIAGNOSIS — I70222 Atherosclerosis of native arteries of extremities with rest pain, left leg: Secondary | ICD-10-CM

## 2022-10-13 HISTORY — DX: Dilated cardiomyopathy: I42.0

## 2022-10-13 HISTORY — DX: Unspecified atrial fibrillation: I48.91

## 2022-10-13 HISTORY — DX: Chronic obstructive pulmonary disease, unspecified: J44.9

## 2022-10-13 HISTORY — DX: Prediabetes: R73.03

## 2022-10-13 HISTORY — DX: Chronic kidney disease, stage 3b: N18.32

## 2022-10-13 HISTORY — DX: Other complications of anesthesia, initial encounter: T88.59XA

## 2022-10-13 HISTORY — DX: Hypothyroidism, unspecified: E03.9

## 2022-10-13 HISTORY — DX: Age-related osteoporosis without current pathological fracture: M81.0

## 2022-10-13 HISTORY — DX: Peripheral vascular disease, unspecified: I73.9

## 2022-10-13 HISTORY — DX: Chronic systolic (congestive) heart failure: I50.22

## 2022-10-13 NOTE — Patient Instructions (Addendum)
Your procedure is scheduled on: Wednesday 10/22/22 To find out your arrival time, please call [phone] between 1PM - 3PM on:   Tuesday 10/21/22 Report to the Registration Desk on the 1st floor of the [facility]. Valet parking is available.  If your arrival time is 6:00 am, do not arrive before that time as the [facility] entrance doors do not open until 6:00 am.  REMEMBER: Instructions that are not followed completely may result in serious medical risk, up to and including death; or upon the discretion of your surgeon and anesthesiologist your surgery may need to be rescheduled.  Do not eat food or drink any liquids after midnight the night before surgery.  No gum chewing or hard candies.  One week prior to surgery: Stop Anti-inflammatories (NSAIDS) such as Advil, Aleve, Ibuprofen, Motrin, Naproxen, Naprosyn,  Excedrin, Goody's Powder, BC Powder. You may however, continue to take Tylenol if needed for pain up until the day of surgery.  Stop ANY OVER THE COUNTER supplements until after surgery.  Continue taking all prescribed medications with the exception of the following: XARELTO, hold for sugery last dose Sunday 10/19/22. Continue your daily Aspirin  TAKE ONLY THESE MEDICATIONS THE MORNING OF SURGERY WITH A SIP OF WATER:  amiodarone (PACERONE) 200 MG tablet  gabapentin (NEURONTIN) 300 MG capsule  levothyroxine (SYNTHROID) 100 MCG tablet  lovastatin (MEVACOR) 20 MG tablet   No Alcohol for 24 hours before or after surgery.  No Smoking including e-cigarettes for 24 hours before surgery.  No chewable tobacco products for at least 6 hours before surgery.  No nicotine patches on the day of surgery.  Do not use any "recreational" drugs for at least a week (preferably 2 weeks) before your surgery.  Please be advised that the combination of cocaine and anesthesia may have negative outcomes, up to and including death. If you test positive for cocaine, your surgery will be  cancelled.  On the morning of surgery brush your teeth with toothpaste and water, you may rinse your mouth with mouthwash if you wish. Do not swallow any toothpaste or mouthwash.  Use CHG Soap or wipes as directed on instruction sheet.  Do not wear lotions, powders, or perfumes.   Do not shave body hair from the neck down 48 hours before surgery.  Wear comfortable clothing (specific to your surgery type) to the hospital.  Do not wear jewelry, make-up, hairpins, clips or nail polish.  Contact lenses, hearing aids and dentures may not be worn into surgery.  Do not bring valuables to the hospital. [facility] is not responsible for any missing/lost belongings or valuables.   Notify your doctor if there is any change in your medical condition (cold, fever, infection).  If you are being discharged the day of surgery, you will not be allowed to drive home. You will need a responsible individual to drive you home and stay with you for 24 hours after surgery.   If you are taking public transportation, you will need to have a responsible individual with you.  If you are being admitted to the hospital overnight, leave your suitcase in the car. After surgery it may be brought to your room.  In case of increased patient census, it may be necessary for you, the patient, to continue your postoperative care in the Same Day Surgery department.  After surgery, you can help prevent lung complications by doing breathing exercises.  Take deep breaths and cough every 1-2 hours. Your doctor may order a device  called an Facilities manager to help you take deep breaths. When coughing or sneezing, hold a pillow firmly against your incision with both hands. This is called "splinting." Doing this helps protect your incision. It also decreases belly discomfort.  Surgery Visitation Policy:  Patients undergoing a surgery or procedure may have two family members or support persons with them as long as the  person is not COVID-19 positive or experiencing its symptoms.   Inpatient Visitation:    Visiting hours are 7 a.m. to 8 p.m. Up to four visitors are allowed at one time in a patient room. The visitors may rotate out with other people during the day. One designated support person (adult) may remain overnight.  Please call the Pre-admissions Testing Dept. at [phone] if you have any questions about these instructions.     Preparing for Surgery with CHLORHEXIDINE GLUCONATE (CHG) Soap  Chlorhexidine Gluconate (CHG) Soap  o An antiseptic cleaner that kills germs and bonds with the skin to continue killing germs even after washing  o Used for showering the night before surgery and morning of surgery  Before surgery, you can play an important role by reducing the number of germs on your skin.  CHG (Chlorhexidine gluconate) soap is an antiseptic cleanser which kills germs and bonds with the skin to continue killing germs even after washing.  Please do not use if you have an allergy to CHG or antibacterial soaps. If your skin becomes reddened/irritated stop using the CHG.  1. Shower the NIGHT BEFORE SURGERY and the MORNING OF SURGERY with CHG soap.  2. If you choose to wash your hair, wash your hair first as usual with your normal shampoo.  3. After shampooing, rinse your hair and body thoroughly to remove the shampoo.  4. Use CHG as you would any other liquid soap. You can apply CHG directly to the skin and wash gently with a scrungie or a clean washcloth.  5. Apply the CHG soap to your body only from the neck down. Do not use on open wounds or open sores. Avoid contact with your eyes, ears, mouth, and genitals (private parts). Wash face and genitals (private parts) with your normal soap.  6. Wash thoroughly, paying special attention to the area where your surgery will be performed.  7. Thoroughly rinse your body with warm water.  8. Do not shower/wash with your normal soap after  using and rinsing off the CHG soap.  9. Pat yourself dry with a clean towel.  10. Wear clean pajamas to bed the night before surgery.  12. Place clean sheets on your bed the night of your first shower and do not sleep with pets.  13. Shower again with the CHG soap on the day of surgery prior to arriving at the hospital.  14. Do not apply any deodorants/lotions/powders.  15. Please wear clean clothes to the hospital.

## 2022-10-14 DIAGNOSIS — M5416 Radiculopathy, lumbar region: Secondary | ICD-10-CM | POA: Diagnosis not present

## 2022-10-14 DIAGNOSIS — M5136 Other intervertebral disc degeneration, lumbar region: Secondary | ICD-10-CM | POA: Diagnosis not present

## 2022-10-14 DIAGNOSIS — M48062 Spinal stenosis, lumbar region with neurogenic claudication: Secondary | ICD-10-CM | POA: Diagnosis not present

## 2022-10-14 DIAGNOSIS — E785 Hyperlipidemia, unspecified: Secondary | ICD-10-CM | POA: Diagnosis not present

## 2022-10-14 DIAGNOSIS — R609 Edema, unspecified: Secondary | ICD-10-CM | POA: Diagnosis not present

## 2022-10-14 DIAGNOSIS — I10 Essential (primary) hypertension: Secondary | ICD-10-CM | POA: Diagnosis not present

## 2022-10-14 DIAGNOSIS — G8918 Other acute postprocedural pain: Secondary | ICD-10-CM | POA: Diagnosis not present

## 2022-10-14 DIAGNOSIS — I4821 Permanent atrial fibrillation: Secondary | ICD-10-CM | POA: Diagnosis not present

## 2022-10-14 DIAGNOSIS — R82998 Other abnormal findings in urine: Secondary | ICD-10-CM | POA: Diagnosis not present

## 2022-10-14 DIAGNOSIS — M48061 Spinal stenosis, lumbar region without neurogenic claudication: Secondary | ICD-10-CM | POA: Diagnosis not present

## 2022-10-14 DIAGNOSIS — M5126 Other intervertebral disc displacement, lumbar region: Secondary | ICD-10-CM | POA: Diagnosis not present

## 2022-10-14 DIAGNOSIS — N1832 Chronic kidney disease, stage 3b: Secondary | ICD-10-CM | POA: Diagnosis not present

## 2022-10-14 DIAGNOSIS — I509 Heart failure, unspecified: Secondary | ICD-10-CM | POA: Diagnosis not present

## 2022-10-15 ENCOUNTER — Encounter: Payer: Self-pay | Admitting: Urgent Care

## 2022-10-15 ENCOUNTER — Encounter
Admission: RE | Admit: 2022-10-15 | Discharge: 2022-10-15 | Disposition: A | Discharge status: Home / Self Care | Payer: PPO | Source: Ambulatory Visit | Attending: Vascular Surgery | Admitting: Vascular Surgery

## 2022-10-15 DIAGNOSIS — I70222 Atherosclerosis of native arteries of extremities with rest pain, left leg: Secondary | ICD-10-CM | POA: Insufficient documentation

## 2022-10-15 DIAGNOSIS — Z01810 Encounter for preprocedural cardiovascular examination: Secondary | ICD-10-CM | POA: Diagnosis not present

## 2022-10-15 DIAGNOSIS — Z01818 Encounter for other preprocedural examination: Secondary | ICD-10-CM | POA: Insufficient documentation

## 2022-10-15 LAB — TYPE AND SCREEN
ABO/RH(D): O POS
Antibody Screen: NEGATIVE

## 2022-10-20 ENCOUNTER — Encounter: Payer: Self-pay | Admitting: Vascular Surgery

## 2022-10-20 NOTE — Progress Notes (Signed)
Perioperative / Anesthesia Services  Pre-Admission Testing Clinical Review / Preoperative Anesthesia Consult  Date: 10/20/22  Patient Demographics:  Name: [name] CARMICKLE Sr. DOB:   02-Jul-1940 MRN:   [id number]  Planned Surgical Procedure(s):    Case: [id number] Date/Time: 10/22/22 1000   Procedures:      BYPASS GRAFT FEMORAL-TIBIAL ARTERY (Left)     APPLICATION OF CELL SAVER (Left)   Anesthesia type: General   Pre-op diagnosis: ASO WITH ULCERATION   Location: [facility] OR ROOM 08 / [facility]S FOR ANESTHESIA GROUP   Surgeons: Renford Dills, MD     NOTE: Available PAT nursing documentation and vital signs have been reviewed. Clinical nursing staff has updated patient's PMH/PSHx, current medication list, and drug allergies/intolerances to ensure comprehensive history available to assist in medical decision making as it pertains to the aforementioned surgical procedure and anticipated anesthetic course. Extensive review of available clinical information personally performed. [place] PMH and PSHx updated with any diagnoses/procedures that  may have been inadvertently omitted during his intake with the pre-admission testing department's nursing staff.  Clinical Discussion:  [name] PERSLEY Sr. is a 82 y.o. male who is submitted for pre-surgical anesthesia review and clearance prior to him undergoing the above procedure. Patient is a Former Smoker (100 pack years; quit 07/2007). Pertinent PMH includes: CAD, atrial fibrillation, DCM, CHF, PVD, HTN, HLD, pre-diabetes, hypothyroidism, CKD-III, COPD, GERD (no daily Tx), OA, lumbar DDD  Patient is followed by cardiology (pressures, MD). He was last seen in the cardiology clinic on ***; notes reviewed. ***At the time of his clinic visit, patient doing well overall from a cardiovascular perspective. Patient denied any chest pain, shortness of breath, PND, orthopnea, palpitations, significant peripheral edema, weakness, fatigue, vertiginous symptoms,  or presyncope/syncope. Patient with a past medical history significant for cardiovascular diagnoses. Documented physical exam was grossly benign, providing no evidence of acute exacerbation and/or decompensation of the patient's known cardiovascular conditions.  ***  Blood pressure***controlled at *** mmHg on currently prescribed*** therapies.  Patient is on***for his HLD diagnosis and ASCVD prevention. Patient is not diabetic/T2DM***controlled on currently prescribed regimen; last HgbA1c was***when checked on***. He does not have an OSAH diagnosis. ***Functional capacity, as defined by DASI, is documented as being >/= 4 METS. ***Functional capacity limited by***, however with that being said, patient still felt to be able to achieve at least 4 METS of physical activity without experiencing any degree of angina/anginal equivalent symptoms. No changes were made to his medication regimen during his visit with cardiology.  Patient scheduled to follow-up with outpatient cardiology in***months or sooner if needed.  [name] Prom Sr. is scheduled for a BYPASS GRAFT FEMORAL-TIBIAL ARTERY (Left) on 10/22/2022 with Dr. Levora Dredge, MD.  Given patient's past medical history significant for cardiovascular diagnoses, presurgical cardiac clearance was sought by the PAT team. Per cardiology, "this patient is optimized for surgery and may proceed with the planned procedural course with a LOW risk of significant perioperative cardiovascular complications".  Again, this patient is on daily oral anticoagulation therapy.  Patient has been instructed on recommendations from his cardiologist regarding the need to hold his rivaroxaban dose for 3 days prior to his procedure with plans to restart as soon as postoperatively risk felt to be minimized by his primary attending surgeon.  The patient is aware that his last dose of rivaroxaban should be on 10/18/2022.  Patient reports previous perioperative complications with  anesthesia in the past.  Patient has experience (+) hypotension during routine colonoscopy in the  past.  In review of the available records, I do not see where this patient has undergone any recent anesthetic courses within the [facility] system.     10/13/2022    1:13 PM 10/01/2022   11:08 AM 08/14/2022    9:22 AM  Vitals with BMI  Height     Weight 240 lbs 240 lbs 240 lbs  BMI 30.8 30.8 30.8  Systolic   122  Diastolic   60  Pulse   56    Providers/Specialists:   NOTE: Primary physician provider listed below. Patient may have been seen by APP or partner within same practice.   PROVIDER ROLE / SPECIALTY LAST OV  Schnier, Latina Craver, MD Vascular Surgery (Surgeon) 08/14/2022  Ronnald Ramp, MD Primary Care Provider 10/01/2022  Marcina Millard, MD Cardiology 03/18/2022  Lorain Childes, MD Nephrology 10/14/2022  Merri Ray, MD Physiatry 10/14/2022   Allergies:  Patient has no known allergies.  Current Home Medications:   No current facility-administered medications for this encounter.    amiodarone (PACERONE) 200 MG tablet   aspirin EC 81 MG tablet   fluticasone (FLONASE) 50 MCG/ACT nasal spray   gabapentin (NEURONTIN) 300 MG capsule   levothyroxine (SYNTHROID) 100 MCG tablet   lisinopril (ZESTRIL) 2.5 MG tablet   lovastatin (MEVACOR) 20 MG tablet   torsemide (DEMADEX) 20 MG tablet   traMADol (ULTRAM) 50 MG tablet   XARELTO 20 MG TABS tablet   History:   Past Medical History:  Diagnosis Date   A-fib    a.) CHA2DS2VASc = 5 (age x2, CHF, HTN, vascular disease history);  b.) rate/rhythm maintained on oral amiodarone; chronically anticoagulated with rivaroxaban   Allergy    Arthritis    CAD (coronary artery disease) 11/17/2012   a.) LHC 11/17/2012: 30% mLM, 30% mLAD, 30% mLCx, 50%/30% OM1, 30% pRCA - med mgmt   Chronic systolic CHF (congestive heart failure), NYHA class 2    a.) TTE 05/30/2015: EF 40%, glob HK, mild LVH, mild LAE,  mild MR/TR; b.) TTE 10/14/2017: EF 40%, glob HK, mild LVH, mod BAE, triv PR, mild MR/TR, G1DD   Complication of anesthesia    a.) hypotension during routine colonoscopy   COPD (chronic obstructive pulmonary disease)    Dilated cardiomyopathy    a.) LHC 11/17/2012: EF 35%; b.) TTE 05/30/2015: EF 40%; c.) TTE 10/14/2017: EF 40%   GERD (gastroesophageal reflux disease)    History of chicken pox    Hyperlipidemia    Hypertension    Hypothyroidism    Long term current use of amiodarone    Long term current use of anticoagulant    a.) Rivaroxaban   Osteoporosis    Peripheral vascular disease    Pre-diabetes    Stage 3b chronic kidney disease    Past Surgical History:  Procedure Laterality Date   BACK SURGERY     x5   DOPPLER ECHOCARDIOGRAPHY  06/21/2013   EF=35% while in Sinus Rhythm   LEFT HEART CATH AND CORONARY ANGIOGRAPHY Left 11/17/2012   Procedure: LEFT HEART CATHETERIZATION AND CORONARY ANGIOGRAPHY; Location: [facility]; Surgeon: Marcina Millard, MD   LOWER EXTREMITY ANGIOGRAPHY Right 05/20/2022   Procedure: Lower Extremity Angiography;  Surgeon: Renford Dills, MD;  Location: [facility] INVASIVE CV LAB;  Service: Cardiovascular;  Laterality: Right;   LOWER EXTREMITY ANGIOGRAPHY Left 07/15/2022   Procedure: Lower Extremity Angiography;  Surgeon: Renford Dills, MD;  Location: [facility] INVASIVE CV LAB;  Service: Cardiovascular;  Laterality: Left;   Myocardial perfusion scan  10/28/2012   severe global LV enlargement. Mild RV enlargement. Mild LVH. Mild mitral and tricuspid insufficency   polyp excision  09/28/2005   UPPER GASTROINTESTINAL ENDOSCOPY  03/27/2004   Gastropathy, Gastritis; Duodenopathy   Family History  Problem Relation Age of Onset   Alzheimer's disease Mother    Heart attack Father    Colon cancer Brother    Social History   Tobacco Use   Smoking status: Former    Packs/day: 2.00    Years: 50.00    Additional pack years: 0.00    Total pack years: 100.00     Types: Cigarettes    Quit date: 07/01/2007    Years since quitting: 15.3   Smokeless tobacco: Never  Vaping Use   Vaping Use: Never used  Substance Use Topics   Alcohol use: No    Alcohol/week: 0.0 standard drinks of alcohol   Drug use: No    Pertinent Clinical Results:  LABS:   Lab Results  Component Value Date   WBC 7.2 07/23/2022   HGB 10.4 (L) 07/23/2022   HCT 32.7 (L) 07/23/2022   MCV 92.9 07/23/2022   PLT 305 07/23/2022   Lab Results  Component Value Date   NA 137 07/23/2022   K 4.3 07/23/2022   CO2 26 07/23/2022   GLUCOSE 106 (H) 07/23/2022   BUN 22 07/23/2022   CREATININE 2.33 (H) 07/23/2022   CALCIUM 9.0 07/23/2022   EGFR 37 (L) 01/07/2022   GFRNONAA 28 (L) 07/23/2022   Lab Results  Component Value Date   HGBA1C 5.5 07/21/2022    ECG: Date: 10/15/2022 Time ECG obtained: 0851 AM Rate: 62 bpm Rhythm:  Sinus rhythm with frequent PACs and occasional PVCs Axis (leads I and aVF): Left axis deviation Intervals: QRS 100 ms. QTc 497 ms. ST segment and T wave changes: No evidence of acute ST segment elevation or depression Comparison: Similar to previous tracing obtained on 11/26/2021.  Atrial and ventricular ectopy now noted.   IMAGING / PROCEDURES: TRANSTHORACIC ECHOCARDIOGRAM performed on 10/14/2017 Mildly reduced left ventricular systolic function with an EF of 40% Global left ventricular hypokinesis Mild LVH Left ventricular diastolic Doppler parameters consistent with abnormal relaxation (G1DD). Moderate biatrial enlargement Trivial PR Mild MR and TR Normal gradients; no valvular stenosis  Impression and Plan:  [name] Prom Sr. has been referred for pre-anesthesia review and clearance prior to him undergoing the planned anesthetic and procedural courses. Available labs, pertinent testing, and imaging results were personally reviewed by me in preparation for upcoming operative/procedural course. [facility] medical record has been updated  following extensive record review and patient interview with PAT staff.   This patient has been appropriately cleared by cardiology with an overall LOW risk of significant perioperative cardiovascular complications. Based on clinical review performed today (10/20/22), barring any significant acute changes in the patient's overall condition, it is anticipated that he will be able to proceed with the planned surgical intervention. Any acute changes in clinical condition may necessitate his procedure being postponed and/or cancelled. Patient will meet with anesthesia team (MD and/or CRNA) on the day of his procedure for preoperative evaluation/assessment. Questions regarding anesthetic course will be fielded at that time.   Pre-surgical instructions were reviewed with the patient during his PAT appointment, and questions were fielded to satisfaction by PAT clinical staff. He has been instructed on which medications that he will need to hold prior to surgery, as well as the ones that have been deemed safe/appropriate to take of the day  of his procedure. As part of the general education provided by PAT, patient made aware both verbally and in writing, that he would need to abstain from the use of any illegal substances during his perioperative course.  He was advised that failure to follow the provided instructions could necessitate case cancellation or result serious perioperative complications up to and including death. Patient encouraged to contact PAT and/or his surgeon's office to discuss any questions or concerns that may arise prior to surgery; verbalized understanding.   Quentin Mulling, MSN, APRN, FNP-C, CEN [facility]  Peri-operative Services Nurse Practitioner Phone: [phone] Fax: [phone] 10/20/22 12:16 PM  NOTE: This note has been prepared using Dragon dictation software. Despite my best ability to proofread, there is always the potential that unintentional  transcriptional errors may still occur from this process.

## 2022-10-21 MED ORDER — CHLORHEXIDINE GLUCONATE CLOTH 2 % EX PADS
6.0000 | MEDICATED_PAD | Freq: Once | CUTANEOUS | Status: AC
  Administered 2022-10-22: 6 via TOPICAL

## 2022-10-21 MED ORDER — CHLORHEXIDINE GLUCONATE CLOTH 2 % EX PADS
6.0000 | MEDICATED_PAD | Freq: Once | CUTANEOUS | Status: AC
  Administered 2022-10-21: 6 via TOPICAL

## 2022-10-21 MED ORDER — CEFAZOLIN SODIUM-DEXTROSE 2-4 GM/100ML-% IV SOLN
2.0000 g | INTRAVENOUS | Status: AC
  Administered 2022-10-22 (×2): 2 g via INTRAVENOUS

## 2022-10-22 ENCOUNTER — Encounter: Payer: Self-pay | Admitting: Vascular Surgery

## 2022-10-22 ENCOUNTER — Encounter
Admission: RE | Disposition: A | Discharge status: Home / Self Care | Payer: Self-pay | Source: Home / Self Care | Attending: Vascular Surgery

## 2022-10-22 ENCOUNTER — Other Ambulatory Visit: Payer: Self-pay

## 2022-10-22 ENCOUNTER — Inpatient Hospital Stay
Admission: RE | Admit: 2022-10-22 | Discharge: 2022-10-27 | DRG: 253 | Disposition: A | Discharge status: Home / Self Care | Payer: PPO | Attending: Vascular Surgery | Admitting: Vascular Surgery

## 2022-10-22 ENCOUNTER — Inpatient Hospital Stay: Payer: PPO | Admitting: Urgent Care

## 2022-10-22 DIAGNOSIS — R7303 Prediabetes: Secondary | ICD-10-CM | POA: Diagnosis present

## 2022-10-22 DIAGNOSIS — Z7982 Long term (current) use of aspirin: Secondary | ICD-10-CM

## 2022-10-22 DIAGNOSIS — J449 Chronic obstructive pulmonary disease, unspecified: Secondary | ICD-10-CM | POA: Diagnosis present

## 2022-10-22 DIAGNOSIS — I5022 Chronic systolic (congestive) heart failure: Secondary | ICD-10-CM | POA: Diagnosis not present

## 2022-10-22 DIAGNOSIS — I4891 Unspecified atrial fibrillation: Secondary | ICD-10-CM | POA: Diagnosis not present

## 2022-10-22 DIAGNOSIS — M5416 Radiculopathy, lumbar region: Secondary | ICD-10-CM | POA: Diagnosis not present

## 2022-10-22 DIAGNOSIS — I70245 Atherosclerosis of native arteries of left leg with ulceration of other part of foot: Secondary | ICD-10-CM | POA: Diagnosis present

## 2022-10-22 DIAGNOSIS — Z01818 Encounter for other preprocedural examination: Secondary | ICD-10-CM | POA: Diagnosis not present

## 2022-10-22 DIAGNOSIS — Z87891 Personal history of nicotine dependence: Secondary | ICD-10-CM

## 2022-10-22 DIAGNOSIS — I739 Peripheral vascular disease, unspecified: Secondary | ICD-10-CM | POA: Diagnosis not present

## 2022-10-22 DIAGNOSIS — N1832 Chronic kidney disease, stage 3b: Secondary | ICD-10-CM | POA: Diagnosis present

## 2022-10-22 DIAGNOSIS — K219 Gastro-esophageal reflux disease without esophagitis: Secondary | ICD-10-CM | POA: Diagnosis not present

## 2022-10-22 DIAGNOSIS — I890 Lymphedema, not elsewhere classified: Secondary | ICD-10-CM | POA: Diagnosis not present

## 2022-10-22 DIAGNOSIS — Z7901 Long term (current) use of anticoagulants: Secondary | ICD-10-CM

## 2022-10-22 DIAGNOSIS — R197 Diarrhea, unspecified: Secondary | ICD-10-CM | POA: Diagnosis not present

## 2022-10-22 DIAGNOSIS — L97529 Non-pressure chronic ulcer of other part of left foot with unspecified severity: Secondary | ICD-10-CM | POA: Diagnosis not present

## 2022-10-22 DIAGNOSIS — E785 Hyperlipidemia, unspecified: Secondary | ICD-10-CM | POA: Diagnosis present

## 2022-10-22 DIAGNOSIS — I130 Hypertensive heart and chronic kidney disease with heart failure and stage 1 through stage 4 chronic kidney disease, or unspecified chronic kidney disease: Secondary | ICD-10-CM | POA: Diagnosis present

## 2022-10-22 DIAGNOSIS — I70299 Other atherosclerosis of native arteries of extremities, unspecified extremity: Secondary | ICD-10-CM | POA: Diagnosis present

## 2022-10-22 DIAGNOSIS — M810 Age-related osteoporosis without current pathological fracture: Secondary | ICD-10-CM | POA: Diagnosis not present

## 2022-10-22 DIAGNOSIS — G8929 Other chronic pain: Secondary | ICD-10-CM | POA: Diagnosis not present

## 2022-10-22 DIAGNOSIS — M1990 Unspecified osteoarthritis, unspecified site: Secondary | ICD-10-CM | POA: Diagnosis present

## 2022-10-22 DIAGNOSIS — I2510 Atherosclerotic heart disease of native coronary artery without angina pectoris: Principal | ICD-10-CM | POA: Diagnosis present

## 2022-10-22 DIAGNOSIS — M5136 Other intervertebral disc degeneration, lumbar region: Secondary | ICD-10-CM | POA: Diagnosis present

## 2022-10-22 DIAGNOSIS — N184 Chronic kidney disease, stage 4 (severe): Secondary | ICD-10-CM | POA: Diagnosis not present

## 2022-10-22 DIAGNOSIS — E039 Hypothyroidism, unspecified: Secondary | ICD-10-CM | POA: Diagnosis present

## 2022-10-22 DIAGNOSIS — I420 Dilated cardiomyopathy: Secondary | ICD-10-CM | POA: Diagnosis not present

## 2022-10-22 DIAGNOSIS — Z8249 Family history of ischemic heart disease and other diseases of the circulatory system: Secondary | ICD-10-CM | POA: Diagnosis not present

## 2022-10-22 DIAGNOSIS — L97909 Non-pressure chronic ulcer of unspecified part of unspecified lower leg with unspecified severity: Principal | ICD-10-CM | POA: Diagnosis present

## 2022-10-22 DIAGNOSIS — I7025 Atherosclerosis of native arteries of other extremities with ulceration: Secondary | ICD-10-CM | POA: Diagnosis not present

## 2022-10-22 DIAGNOSIS — Z820 Family history of epilepsy and other diseases of the nervous system: Secondary | ICD-10-CM

## 2022-10-22 DIAGNOSIS — Z800 Family history of malignant neoplasm of digestive organs: Secondary | ICD-10-CM

## 2022-10-22 DIAGNOSIS — I4811 Longstanding persistent atrial fibrillation: Secondary | ICD-10-CM | POA: Diagnosis not present

## 2022-10-22 DIAGNOSIS — I120 Hypertensive chronic kidney disease with stage 5 chronic kidney disease or end stage renal disease: Secondary | ICD-10-CM | POA: Diagnosis not present

## 2022-10-22 HISTORY — DX: Spinal stenosis, lumbar region without neurogenic claudication: M48.061

## 2022-10-22 HISTORY — DX: Other long term (current) drug therapy: Z79.899

## 2022-10-22 HISTORY — PX: FEMORAL-TIBIAL BYPASS GRAFT: SHX938

## 2022-10-22 HISTORY — DX: Other intervertebral disc degeneration, lumbar region without mention of lumbar back pain or lower extremity pain: M51.369

## 2022-10-22 HISTORY — DX: Long term (current) use of anticoagulants: Z79.01

## 2022-10-22 HISTORY — PX: ENDARTERECTOMY FEMORAL: SHX5804

## 2022-10-22 HISTORY — DX: Other intervertebral disc degeneration, lumbar region: M51.36

## 2022-10-22 LAB — CREATININE, SERUM
Creatinine, Ser: 2.16 mg/dL — ABNORMAL HIGH (ref 0.61–1.24)
GFR, Estimated: 30 mL/min — ABNORMAL LOW (ref >60)

## 2022-10-22 LAB — CBC
HCT: 32 % — ABNORMAL LOW (ref 39.0–52.0)
Hemoglobin: 10.2 g/dL — ABNORMAL LOW (ref 13.0–17.0)
MCH: 29 pg (ref 26.0–34.0)
MCHC: 31.9 g/dL (ref 30.0–36.0)
MCV: 90.9 fL (ref 80.0–100.0)
Platelets: 177 10*3/uL (ref 150–400)
RBC: 3.52 MIL/uL — ABNORMAL LOW (ref 4.22–5.81)
RDW: 15.9 % — ABNORMAL HIGH (ref 11.5–15.5)
WBC: 8 10*3/uL (ref 4.0–10.5)
nRBC: 0 % (ref 0.0–0.2)

## 2022-10-22 LAB — GLUCOSE, CAPILLARY: Glucose-Capillary: 154 mg/dL — ABNORMAL HIGH (ref 70–99)

## 2022-10-22 LAB — MRSA NEXT GEN BY PCR, NASAL: MRSA by PCR Next Gen: NOT DETECTED

## 2022-10-22 LAB — ABO/RH: ABO/RH(D): O POS

## 2022-10-22 SURGERY — CREATION, BYPASS, ARTERIAL, FEMORAL TO TIBIAL, USING GRAFT
Anesthesia: General | Site: Groin | Laterality: Left

## 2022-10-22 MED ORDER — CEFAZOLIN SODIUM-DEXTROSE 2-4 GM/100ML-% IV SOLN
2.0000 g | Freq: Three times a day (TID) | INTRAVENOUS | Status: AC
  Administered 2022-10-22 – 2022-10-23 (×2): 2 g via INTRAVENOUS
  Filled 2022-10-22 (×2): qty 100

## 2022-10-22 MED ORDER — ENOXAPARIN SODIUM 60 MG/0.6ML IJ SOSY
54.0000 mg | PREFILLED_SYRINGE | INTRAMUSCULAR | Status: DC
  Administered 2022-10-23 – 2022-10-24 (×2): 55 mg via SUBCUTANEOUS
  Filled 2022-10-22 (×2): qty 0.6

## 2022-10-22 MED ORDER — PHENYLEPHRINE HCL-NACL 20-0.9 MG/250ML-% IV SOLN
INTRAVENOUS | Status: DC | PRN
  Administered 2022-10-22: 30 ug/min via INTRAVENOUS

## 2022-10-22 MED ORDER — GUAIFENESIN-DM 100-10 MG/5ML PO SYRP: 15.0000 mL | ORAL_SOLUTION | ORAL | Status: DC | PRN

## 2022-10-22 MED ORDER — PHENOL 1.4 % MT LIQD: 1.0000 | OROMUCOSAL | Status: DC | PRN

## 2022-10-22 MED ORDER — CEFAZOLIN SODIUM-DEXTROSE 2-4 GM/100ML-% IV SOLN
INTRAVENOUS | Status: AC
  Filled 2022-10-22: qty 100

## 2022-10-22 MED ORDER — ROCURONIUM BROMIDE 100 MG/10ML IV SOLN
INTRAVENOUS | Status: DC | PRN
  Administered 2022-10-22: 20 mg via INTRAVENOUS
  Administered 2022-10-22: 10 mg via INTRAVENOUS
  Administered 2022-10-22: 70 mg via INTRAVENOUS
  Administered 2022-10-22: 20 mg via INTRAVENOUS

## 2022-10-22 MED ORDER — NITROGLYCERIN IN D5W 200-5 MCG/ML-% IV SOLN: 5.0000 ug/min | INTRAVENOUS | Status: DC

## 2022-10-22 MED ORDER — DEXAMETHASONE SODIUM PHOSPHATE 10 MG/ML IJ SOLN
INTRAMUSCULAR | Status: DC | PRN
  Administered 2022-10-22: 10 mg via INTRAVENOUS

## 2022-10-22 MED ORDER — ACETAMINOPHEN 325 MG PO TABS
325.0000 mg | ORAL_TABLET | ORAL | Status: DC | PRN
  Administered 2022-10-27: 650 mg via ORAL
  Filled 2022-10-22: qty 2

## 2022-10-22 MED ORDER — CHLORHEXIDINE GLUCONATE CLOTH 2 % EX PADS
6.0000 | MEDICATED_PAD | Freq: Every day | CUTANEOUS | Status: DC
  Administered 2022-10-23 – 2022-10-24 (×2): 6 via TOPICAL

## 2022-10-22 MED ORDER — ACETAMINOPHEN 10 MG/ML IV SOLN: 1000.0000 mg | Freq: Once | INTRAVENOUS | Status: DC | PRN

## 2022-10-22 MED ORDER — ASPIRIN 81 MG PO TBEC
81.0000 mg | DELAYED_RELEASE_TABLET | Freq: Every day | ORAL | Status: DC
  Administered 2022-10-22 – 2022-10-27 (×6): 81 mg via ORAL
  Filled 2022-10-22 (×7): qty 1

## 2022-10-22 MED ORDER — ONDANSETRON HCL 4 MG/2ML IJ SOLN
4.0000 mg | Freq: Once | INTRAMUSCULAR | Status: AC | PRN
  Administered 2022-10-22: 4 mg via INTRAVENOUS

## 2022-10-22 MED ORDER — OXYCODONE HCL 5 MG PO TABS: 5.0000 mg | ORAL_TABLET | Freq: Once | ORAL | Status: DC | PRN

## 2022-10-22 MED ORDER — HEPARIN 30,000 UNITS/1000 ML (OHS) CELLSAVER SOLUTION
Status: AC
  Filled 2022-10-22: qty 1000

## 2022-10-22 MED ORDER — SUGAMMADEX SODIUM 200 MG/2ML IV SOLN
INTRAVENOUS | Status: DC | PRN
  Administered 2022-10-22: 200 mg via INTRAVENOUS

## 2022-10-22 MED ORDER — FENTANYL CITRATE (PF) 100 MCG/2ML IJ SOLN
INTRAMUSCULAR | Status: DC | PRN
  Administered 2022-10-22 (×2): 50 ug via INTRAVENOUS

## 2022-10-22 MED ORDER — GLYCOPYRROLATE 0.2 MG/ML IJ SOLN
INTRAMUSCULAR | Status: AC
  Filled 2022-10-22: qty 1

## 2022-10-22 MED ORDER — FENTANYL CITRATE (PF) 100 MCG/2ML IJ SOLN: 25.0000 ug | INTRAMUSCULAR | Status: DC | PRN

## 2022-10-22 MED ORDER — GABAPENTIN 300 MG PO CAPS
300.0000 mg | ORAL_CAPSULE | Freq: Two times a day (BID) | ORAL | Status: DC
  Administered 2022-10-22 – 2022-10-27 (×10): 300 mg via ORAL
  Filled 2022-10-22 (×10): qty 1

## 2022-10-22 MED ORDER — CHLORHEXIDINE GLUCONATE 0.12 % MT SOLN
15.0000 mL | Freq: Once | OROMUCOSAL | Status: AC
  Administered 2022-10-22: 15 mL via OROMUCOSAL

## 2022-10-22 MED ORDER — CLOPIDOGREL BISULFATE 75 MG PO TABS
75.0000 mg | ORAL_TABLET | Freq: Every day | ORAL | Status: DC
  Administered 2022-10-23 – 2022-10-24 (×2): 75 mg via ORAL
  Filled 2022-10-22 (×2): qty 1

## 2022-10-22 MED ORDER — MAGNESIUM SULFATE 2 GM/50ML IV SOLN: 2.0000 g | Freq: Every day | INTRAVENOUS | Status: DC | PRN

## 2022-10-22 MED ORDER — VISTASEAL 10 ML SINGLE DOSE KIT
PACK | CUTANEOUS | Status: DC | PRN
  Administered 2022-10-22: 10 mL via TOPICAL

## 2022-10-22 MED ORDER — SODIUM CHLORIDE 0.9 % IV SOLN: 500.0000 mL | Freq: Once | INTRAVENOUS | Status: DC | PRN

## 2022-10-22 MED ORDER — VISTASEAL 10 ML SINGLE DOSE KIT
PACK | CUTANEOUS | Status: AC
  Filled 2022-10-22: qty 20

## 2022-10-22 MED ORDER — HEPARIN 30,000 UNITS/1000 ML (OHS) CELLSAVER SOLUTION
Status: AC | PRN
  Administered 2022-10-22: 1

## 2022-10-22 MED ORDER — ALUM & MAG HYDROXIDE-SIMETH 200-200-20 MG/5ML PO SUSP: 15.0000 mL | ORAL | Status: DC | PRN

## 2022-10-22 MED ORDER — SODIUM CHLORIDE 0.9 % IV SOLN
INTRAVENOUS | Status: DC | PRN
  Administered 2022-10-22: 500 mL via SURGICAL_CAVITY

## 2022-10-22 MED ORDER — EPHEDRINE 5 MG/ML INJ
INTRAVENOUS | Status: AC
  Filled 2022-10-22: qty 5

## 2022-10-22 MED ORDER — LIDOCAINE HCL (CARDIAC) PF 100 MG/5ML IV SOSY
PREFILLED_SYRINGE | INTRAVENOUS | Status: DC | PRN
  Administered 2022-10-22: 40 mg via INTRAVENOUS

## 2022-10-22 MED ORDER — DEXAMETHASONE SODIUM PHOSPHATE 10 MG/ML IJ SOLN
INTRAMUSCULAR | Status: AC
  Filled 2022-10-22: qty 1

## 2022-10-22 MED ORDER — ACETAMINOPHEN 10 MG/ML IV SOLN
INTRAVENOUS | Status: DC | PRN
  Administered 2022-10-22: 1000 mg via INTRAVENOUS

## 2022-10-22 MED ORDER — OXYCODONE HCL 5 MG/5ML PO SOLN: 5.0000 mg | Freq: Once | ORAL | Status: DC | PRN

## 2022-10-22 MED ORDER — BUPIVACAINE HCL (PF) 0.5 % IJ SOLN
INTRAMUSCULAR | Status: AC
  Filled 2022-10-22: qty 30

## 2022-10-22 MED ORDER — ROCURONIUM BROMIDE 10 MG/ML (PF) SYRINGE
PREFILLED_SYRINGE | INTRAVENOUS | Status: AC
  Filled 2022-10-22: qty 10

## 2022-10-22 MED ORDER — HEPARIN SODIUM (PORCINE) 1000 UNIT/ML IJ SOLN
INTRAMUSCULAR | Status: AC
  Filled 2022-10-22: qty 10

## 2022-10-22 MED ORDER — VANCOMYCIN HCL 1000 MG IV SOLR
INTRAVENOUS | Status: AC
  Filled 2022-10-22: qty 20

## 2022-10-22 MED ORDER — FAMOTIDINE IN NACL 20-0.9 MG/50ML-% IV SOLN
20.0000 mg | Freq: Two times a day (BID) | INTRAVENOUS | Status: DC
  Administered 2022-10-22 – 2022-10-23 (×2): 20 mg via INTRAVENOUS
  Filled 2022-10-22 (×2): qty 50

## 2022-10-22 MED ORDER — LEVOTHYROXINE SODIUM 100 MCG PO TABS
100.0000 ug | ORAL_TABLET | Freq: Every day | ORAL | Status: DC
  Administered 2022-10-23 – 2022-10-27 (×5): 100 ug via ORAL
  Filled 2022-10-22 (×5): qty 1

## 2022-10-22 MED ORDER — AMIODARONE HCL 200 MG PO TABS
200.0000 mg | ORAL_TABLET | Freq: Every day | ORAL | Status: DC
  Administered 2022-10-23 – 2022-10-27 (×5): 200 mg via ORAL
  Filled 2022-10-22 (×5): qty 1

## 2022-10-22 MED ORDER — ONDANSETRON HCL 4 MG/2ML IJ SOLN
INTRAMUSCULAR | Status: AC
  Filled 2022-10-22: qty 2

## 2022-10-22 MED ORDER — LIDOCAINE HCL (PF) 2 % IJ SOLN
INTRAMUSCULAR | Status: AC
  Filled 2022-10-22: qty 5

## 2022-10-22 MED ORDER — PHENYLEPHRINE HCL-NACL 20-0.9 MG/250ML-% IV SOLN
INTRAVENOUS | Status: AC
  Filled 2022-10-22: qty 250

## 2022-10-22 MED ORDER — GLYCOPYRROLATE 0.2 MG/ML IJ SOLN
INTRAMUSCULAR | Status: DC | PRN
  Administered 2022-10-22 (×2): .1 mg via INTRAVENOUS

## 2022-10-22 MED ORDER — METOPROLOL TARTRATE 5 MG/5ML IV SOLN: 2.0000 mg | INTRAVENOUS | Status: DC | PRN

## 2022-10-22 MED ORDER — LISINOPRIL 2.5 MG PO TABS
2.5000 mg | ORAL_TABLET | Freq: Every day | ORAL | Status: DC
  Administered 2022-10-24 – 2022-10-27 (×4): 2.5 mg via ORAL
  Filled 2022-10-22 (×5): qty 1

## 2022-10-22 MED ORDER — FLUTICASONE PROPIONATE 50 MCG/ACT NA SUSP: 2.0000 | Freq: Every day | NASAL | Status: DC

## 2022-10-22 MED ORDER — ORAL CARE MOUTH RINSE: 15.0000 mL | Freq: Once | OROMUCOSAL | Status: AC

## 2022-10-22 MED ORDER — PROPOFOL 10 MG/ML IV BOLUS
INTRAVENOUS | Status: DC | PRN
  Administered 2022-10-22: 80 mg via INTRAVENOUS

## 2022-10-22 MED ORDER — PROPOFOL 10 MG/ML IV BOLUS
INTRAVENOUS | Status: AC
  Filled 2022-10-22: qty 20

## 2022-10-22 MED ORDER — PRAVASTATIN SODIUM 20 MG PO TABS
20.0000 mg | ORAL_TABLET | Freq: Every day | ORAL | Status: DC
  Administered 2022-10-23 – 2022-10-26 (×4): 20 mg via ORAL
  Filled 2022-10-22 (×4): qty 1

## 2022-10-22 MED ORDER — HYDRALAZINE HCL 20 MG/ML IJ SOLN: 5.0000 mg | INTRAMUSCULAR | Status: DC | PRN

## 2022-10-22 MED ORDER — FENTANYL CITRATE (PF) 100 MCG/2ML IJ SOLN
INTRAMUSCULAR | Status: AC
  Filled 2022-10-22: qty 2

## 2022-10-22 MED ORDER — ACETAMINOPHEN 650 MG RE SUPP: 325.0000 mg | RECTAL | Status: DC | PRN

## 2022-10-22 MED ORDER — LABETALOL HCL 5 MG/ML IV SOLN: 10.0000 mg | INTRAVENOUS | Status: DC | PRN

## 2022-10-22 MED ORDER — OXYCODONE-ACETAMINOPHEN 5-325 MG PO TABS
1.0000 | ORAL_TABLET | ORAL | Status: DC | PRN
  Administered 2022-10-22 – 2022-10-23 (×2): 1 via ORAL
  Administered 2022-10-24 – 2022-10-25 (×2): 2 via ORAL
  Administered 2022-10-26: 1 via ORAL
  Filled 2022-10-22 (×3): qty 1
  Filled 2022-10-22 (×3): qty 2

## 2022-10-22 MED ORDER — SENNOSIDES-DOCUSATE SODIUM 8.6-50 MG PO TABS: 1.0000 | ORAL_TABLET | Freq: Every evening | ORAL | Status: DC | PRN

## 2022-10-22 MED ORDER — CEFAZOLIN SODIUM 1 G IJ SOLR
INTRAMUSCULAR | Status: AC
  Filled 2022-10-22: qty 20

## 2022-10-22 MED ORDER — ONDANSETRON HCL 4 MG/2ML IJ SOLN
4.0000 mg | Freq: Four times a day (QID) | INTRAMUSCULAR | Status: DC | PRN
  Administered 2022-10-26: 4 mg via INTRAVENOUS
  Filled 2022-10-22: qty 2

## 2022-10-22 MED ORDER — KETAMINE HCL 10 MG/ML IJ SOLN
INTRAMUSCULAR | Status: DC | PRN
  Administered 2022-10-22 (×3): 10 mg via INTRAVENOUS

## 2022-10-22 MED ORDER — HEPARIN SODIUM (PORCINE) 1000 UNIT/ML IJ SOLN
INTRAMUSCULAR | Status: DC | PRN
  Administered 2022-10-22: 7000 [IU] via INTRAVENOUS

## 2022-10-22 MED ORDER — SODIUM CHLORIDE 0.9 % IV SOLN: INTRAVENOUS | Status: DC

## 2022-10-22 MED ORDER — CHLORHEXIDINE GLUCONATE 0.12 % MT SOLN
OROMUCOSAL | Status: AC
  Filled 2022-10-22: qty 15

## 2022-10-22 MED ORDER — SODIUM CHLORIDE 0.9 % IV SOLN: INTRAVENOUS | Status: DC | PRN

## 2022-10-22 MED ORDER — ACETAMINOPHEN 10 MG/ML IV SOLN
INTRAVENOUS | Status: AC
  Filled 2022-10-22: qty 100

## 2022-10-22 MED ORDER — ONDANSETRON HCL 4 MG/2ML IJ SOLN
INTRAMUSCULAR | Status: DC | PRN
  Administered 2022-10-22: 4 mg via INTRAVENOUS

## 2022-10-22 MED ORDER — POTASSIUM CHLORIDE CRYS ER 20 MEQ PO TBCR: 20.0000 meq | EXTENDED_RELEASE_TABLET | Freq: Every day | ORAL | Status: DC | PRN

## 2022-10-22 MED ORDER — SORBITOL 70 % SOLN: 30.0000 mL | Freq: Every day | Status: DC | PRN

## 2022-10-22 MED ORDER — HEMOSTATIC AGENTS (NO CHARGE) OPTIME
TOPICAL | Status: DC | PRN
  Administered 2022-10-22: 2 via TOPICAL

## 2022-10-22 MED ORDER — DOPAMINE-DEXTROSE 3.2-5 MG/ML-% IV SOLN: 3.0000 ug/kg/min | INTRAVENOUS | Status: DC

## 2022-10-22 MED ORDER — TORSEMIDE 20 MG PO TABS
10.0000 mg | ORAL_TABLET | Freq: Two times a day (BID) | ORAL | Status: DC
  Administered 2022-10-22 – 2022-10-27 (×7): 10 mg via ORAL
  Filled 2022-10-22 (×8): qty 1

## 2022-10-22 MED ORDER — EPHEDRINE SULFATE (PRESSORS) 50 MG/ML IJ SOLN
INTRAMUSCULAR | Status: DC | PRN
  Administered 2022-10-22: 5 mg via INTRAVENOUS

## 2022-10-22 MED ORDER — LACTATED RINGERS IV SOLN: INTRAVENOUS | Status: DC

## 2022-10-22 MED ORDER — DOCUSATE SODIUM 100 MG PO CAPS
100.0000 mg | ORAL_CAPSULE | Freq: Every day | ORAL | Status: DC
  Administered 2022-10-23 – 2022-10-25 (×3): 100 mg via ORAL
  Filled 2022-10-22 (×4): qty 1

## 2022-10-22 MED ORDER — KETAMINE HCL 50 MG/5ML IJ SOSY
PREFILLED_SYRINGE | INTRAMUSCULAR | Status: AC
  Filled 2022-10-22: qty 5

## 2022-10-22 MED ORDER — HEPARIN SODIUM (PORCINE) 5000 UNIT/ML IJ SOLN
INTRAMUSCULAR | Status: AC
  Filled 2022-10-22: qty 1

## 2022-10-22 MED ORDER — MORPHINE SULFATE (PF) 2 MG/ML IV SOLN
2.0000 mg | INTRAVENOUS | Status: DC | PRN
  Administered 2022-10-23 – 2022-10-24 (×2): 2 mg via INTRAVENOUS
  Filled 2022-10-22 (×2): qty 1

## 2022-10-22 MED ORDER — VANCOMYCIN HCL 1000 MG IV SOLR
INTRAVENOUS | Status: DC | PRN
  Administered 2022-10-22: 1000 mg

## 2022-10-22 MED ORDER — BUPIVACAINE LIPOSOME 1.3 % IJ SUSP
INTRAMUSCULAR | Status: AC
  Filled 2022-10-22: qty 20

## 2022-10-22 SURGICAL SUPPLY — 101 items
ADH SKN CLS APL DERMABOND .7 (GAUZE/BANDAGES/DRESSINGS) ×2
AGENT HMST PWDR BTL CLGN 5GM (Miscellaneous) ×2 IMPLANT
APL PRP STRL LF DISP 70% ISPRP (MISCELLANEOUS) ×12
APPLIER CLIP 11 MED OPEN (CLIP)
APPLIER CLIP 9.375 SM OPEN (CLIP)
APR CLP MED 11 20 MLT OPN (CLIP)
APR CLP SM 9.3 20 MLT OPN (CLIP)
BAG DECANTER FOR FLEXI CONT (MISCELLANEOUS) ×2 IMPLANT
BAG ISL LRG 20X20 DRWSTRG (DRAPES) ×2
BAG ISOLATATION DRAPE 20X20 ST (DRAPES) ×2 IMPLANT
BLADE SURG SZ11 CARB STEEL (BLADE) ×2 IMPLANT
BNDG CMPR 5X4 CHSV STRCH STRL (GAUZE/BANDAGES/DRESSINGS) ×2
BNDG COHESIVE 4X5 TAN STRL LF (GAUZE/BANDAGES/DRESSINGS) IMPLANT
BOOT SUTURE VASCULAR YLW (MISCELLANEOUS) ×2
BRUSH SCRUB EZ  4% CHG (MISCELLANEOUS) ×2
BRUSH SCRUB EZ 4% CHG (MISCELLANEOUS) ×2 IMPLANT
CANISTER WOUND CARE 500ML ATS (WOUND CARE) IMPLANT
CAP TUBING WOUND VAC TRAC (MISCELLANEOUS) IMPLANT
CHLORAPREP W/TINT 26 (MISCELLANEOUS) ×4 IMPLANT
CLAMP SUTURE YELLOW 5 PAIRS (MISCELLANEOUS) ×4 IMPLANT
CLIP APPLIE 11 MED OPEN (CLIP) IMPLANT
CLIP APPLIE 9.375 SM OPEN (CLIP) IMPLANT
COLLAGEN CELLERATERX 5 GRAM (Miscellaneous) IMPLANT
CONNECTOR Y WND VAC (MISCELLANEOUS) IMPLANT
COVER PROBE ULTRASOUND 5X96 (MISCELLANEOUS) IMPLANT
DERMABOND ADVANCED .7 DNX12 (GAUZE/BANDAGES/DRESSINGS) ×4 IMPLANT
DRAPE 3/4 80X56 (DRAPES) ×2 IMPLANT
DRAPE INCISE IOBAN 66X45 STRL (DRAPES) ×4 IMPLANT
DRESSING SURGICEL FIBRLLR 1X2 (HEMOSTASIS) ×4 IMPLANT
DRSG OPSITE POSTOP 4X10 (GAUZE/BANDAGES/DRESSINGS) IMPLANT
DRSG OPSITE POSTOP 4X14 (GAUZE/BANDAGES/DRESSINGS) IMPLANT
DRSG OPSITE POSTOP 4X8 (GAUZE/BANDAGES/DRESSINGS) IMPLANT
DRSG SURGICEL FIBRILLAR 1X2 (HEMOSTASIS) ×4
DRSG VAC GRANUFOAM LG (GAUZE/BANDAGES/DRESSINGS) IMPLANT
ELECT CAUTERY BLADE 6.4 (BLADE) ×4 IMPLANT
ELECT REM PT RETURN 9FT ADLT (ELECTROSURGICAL) ×4
ELECTRODE REM PT RTRN 9FT ADLT (ELECTROSURGICAL) ×2 IMPLANT
GAUZE 4X4 16PLY ~~LOC~~+RFID DBL (SPONGE) ×4 IMPLANT
GEL ULTRASOUND 20GR AQUASONIC (MISCELLANEOUS) ×2 IMPLANT
GLOVE BIO SURGEON STRL SZ7 (GLOVE) ×4 IMPLANT
GLOVE SURG SYN 8.0 (GLOVE) ×10 IMPLANT
GLOVE SURG SYN 8.0 PF PI (GLOVE) ×2 IMPLANT
GOWN STRL REUS W/ TWL LRG LVL3 (GOWN DISPOSABLE) ×8 IMPLANT
GOWN STRL REUS W/ TWL XL LVL3 (GOWN DISPOSABLE) ×4 IMPLANT
GOWN STRL REUS W/TWL LRG LVL3 (GOWN DISPOSABLE) ×4
GOWN STRL REUS W/TWL XL LVL3 (GOWN DISPOSABLE) ×10
GRAFT VASC DISTAFLO 6X80 (Graft) IMPLANT
GRAFT VASC PATCH XENOSURE 1X14 (Vascular Products) IMPLANT
HEAD CUTTING 'VALVULOTOME URSL (MISCELLANEOUS) ×2 IMPLANT
HOLDER FOLEY CATH W/STRAP (MISCELLANEOUS) IMPLANT
IV NS 500ML (IV SOLUTION) ×2
IV NS 500ML BAXH (IV SOLUTION) ×2 IMPLANT
KIT PREVENA INCISION MGT 13 (CANNISTER) IMPLANT
KIT STIMULAN RAPID CURE 5CC (Orthopedic Implant) IMPLANT
KIT TURNOVER KIT A (KITS) ×2 IMPLANT
LABEL OR SOLS (LABEL) ×2 IMPLANT
LOOP VESSEL MAXI  1X406 RED (MISCELLANEOUS) ×8
LOOP VESSEL MAXI 1X406 RED (MISCELLANEOUS) ×6 IMPLANT
LOOP VESSEL MINI 0.8X406 BLUE (MISCELLANEOUS) ×4 IMPLANT
MANIFOLD NEPTUNE II (INSTRUMENTS) ×2 IMPLANT
NDL FILTER BLUNT 18X1 1/2 (NEEDLE) ×2 IMPLANT
NEEDLE FILTER BLUNT 18X1 1/2 (NEEDLE) ×2 IMPLANT
NS IRRIG 1000ML POUR BTL (IV SOLUTION) ×2 IMPLANT
PACK BASIN MAJOR ARMC (MISCELLANEOUS) ×2 IMPLANT
PACK UNIVERSAL (MISCELLANEOUS) ×2 IMPLANT
PAD PREP 24X41 OB/GYN DISP (PERSONAL CARE ITEMS) ×2 IMPLANT
PENCIL SMOKE EVACUATOR (MISCELLANEOUS) IMPLANT
SET WALTER ACTIVATION W/DRAPE (SET/KITS/TRAYS/PACK) ×2 IMPLANT
SPONGE T-LAP 18X18 ~~LOC~~+RFID (SPONGE) ×6 IMPLANT
STAPLER SKIN PROX 35W (STAPLE) ×2 IMPLANT
SUT ETHIBOND CT1 BRD #0 30IN (SUTURE) IMPLANT
SUT GORETEX CV-6TTC-13 36IN (SUTURE) IMPLANT
SUT GTX CV-5 TTC13 DBL (SUTURE) IMPLANT
SUT MNCRL+ 5-0 UNDYED PC-3 (SUTURE) ×2 IMPLANT
SUT MONOCRYL 5-0 (SUTURE) ×2
SUT PROLENE 3 0 SH DA (SUTURE) ×2 IMPLANT
SUT PROLENE 5 0 RB 1 DA (SUTURE) ×4 IMPLANT
SUT PROLENE 6 0 BV (SUTURE) ×12 IMPLANT
SUT PROLENE 7 0 BV 1 (SUTURE) ×8 IMPLANT
SUT SILK 2 0 (SUTURE) ×2
SUT SILK 2 0 SH (SUTURE) ×2 IMPLANT
SUT SILK 2-0 18XBRD TIE 12 (SUTURE) ×2 IMPLANT
SUT SILK 3 0 (SUTURE) ×6
SUT SILK 3-0 18XBRD TIE 12 (SUTURE) ×2 IMPLANT
SUT SILK 4 0 (SUTURE) ×2
SUT SILK 4-0 18XBRD TIE 12 (SUTURE) ×2 IMPLANT
SUT VIC AB 2-0 CT1 (SUTURE) ×6 IMPLANT
SUT VIC AB 3-0 SH 27 (SUTURE) ×8
SUT VIC AB 3-0 SH 27X BRD (SUTURE) ×2 IMPLANT
SUT VICRYL+ 3-0 36IN CT-1 (SUTURE) ×6 IMPLANT
SYR 20ML LL LF (SYRINGE) ×2 IMPLANT
SYR 3ML LL SCALE MARK (SYRINGE) ×2 IMPLANT
TAG SUTURE CLAMP YLW 5PR (MISCELLANEOUS) ×2
TAPE UMBILICAL 1/8 X36 TWILL (MISCELLANEOUS) ×2 IMPLANT
TOWEL OR 17X26 4PK STRL BLUE (TOWEL DISPOSABLE) ×2 IMPLANT
TRAP FLUID SMOKE EVACUATOR (MISCELLANEOUS) ×2 IMPLANT
TRAY FOLEY MTR SLVR 16FR STAT (SET/KITS/TRAYS/PACK) ×2 IMPLANT
VALVULOTOME HEAD CUTTING URSL (MISCELLANEOUS) IMPLANT
VALVULOTOME URESIL (MISCELLANEOUS) ×2
WATER STERILE IRR 500ML POUR (IV SOLUTION) ×2 IMPLANT
WND VAC CAP TUBING TRAC (MISCELLANEOUS)

## 2022-10-22 NOTE — Anesthesia Procedure Notes (Signed)
Procedure Name: Intubation Date/Time: 10/22/2022 11:12 AM  Performed by: Morene Crocker, CRNAPre-anesthesia Checklist: Patient identified, Patient being monitored, Timeout performed, Emergency Drugs available and Suction available Patient Re-evaluated:Patient Re-evaluated prior to induction Oxygen Delivery Method: Circle system utilized Preoxygenation: Pre-oxygenation with 100% oxygen Induction Type: IV induction Ventilation: Two handed mask ventilation required Laryngoscope Size: 3 and McGraph Grade View: Grade I Tube type: Oral Tube size: 7.5 mm Number of attempts: 1 Airway Equipment and Method: Stylet Placement Confirmation: ETT inserted through vocal cords under direct vision, positive ETCO2 and breath sounds checked- equal and bilateral Secured at: 23 cm Tube secured with: Tape Dental Injury: Teeth and Oropharynx as per pre-operative assessment  Comments: Smooth, atraumatic intubation, no complications noted

## 2022-10-22 NOTE — Op Note (Signed)
VEIN AND VASCULAR    OPERATIVE NOTE   PROCEDURE: left common femoral artery to left posterior tibial tibial artery bypass with 6 mm distal flow graft Left profunda femoris endarterectomy endarterectomy  PRE-OPERATIVE DIAGNOSIS: Atherosclerotic Occlusive Disease with ulceration of the left foot  POST-OPERATIVE DIAGNOSIS:  Atherosclerotic Occlusive Disease with ulceration of the left foot  CO-SURGEONS: Renford Dills, M.D. and Annice Needy, MD  ASSISTANT(S): None  ANESTHESIA: general  ESTIMATED BLOOD LOSS: 200 cc  FINDING(S): None   SPECIMEN(S): Calcific plaque left profunda femoris artery  INDICATIONS:   [name] Prom Sr. is a 82 y.o. male who presents with nonhealing wounds of the left foot.  The patient's occlusive disease is not amenable to intervention.  The risk, benefits, and alternative for bypass operations were discussed with the patient.  The patient is aware the risks include but are not limited to: bleeding, infection, myocardial infarction, stroke, limb loss, nerve damage, need for additional procedures in the future, wound complications, and inability to complete the bypass.  The patient is voices understanding of these risks and agreed to proceed.  DESCRIPTION: After informed consent was obtained, the patient was brought back to the operating room and placed in the supine position.  Prior to induction, the patient was given intravenous antibiotics.  After general anesthesia is induced, the patient was prepped and draped in the standard fashion for a femoral to popliteal bypass operation.  Appropriate timeout is called.    Co-surgeons are required because this is a complex multilevel procedure with work being performed simultaneously from both the patient's right and left sides.  This also expedites the procedure making a shorter operative time reducing complications and improving patient safety.   Attention was turned to the left groin.  A longitudinal  incision was made over the left common femoral artery.  Using blunt dissection and electrocautery, the artery was dissected circumferentially from the inguinal ligament down to the femoral bifurcation.  The superficial femoral artery, profunda femoral artery, and distal external iliac artery are looped with Silastic vessel loops.  Circumflex branches were also dissected and controlled with vessel loops as needed.     Medial incision was then made in the calf and the saphenous vein and 2 other veins that were possible conduits were identified and then dissected circumferentially.  Side branches were ligated and divided between 2-0 and 3-0 silk ties.  Unfortunately none of these proved to be viable options as a conduit for bypass.  Ultimately, the best of the 3 occludes at the level of the knee/distal thigh.  Not one of the 3 which was probed with the blunt tip of the tunneler was adequate.  The fascia was then identified and then opened.  Gastroc muscle was then noted and the dissection deepened to expose the posterior tibial artery. It was dissected circumferentially and looped with Silastic vessel loops. A tunneler was then passed and a Bard distal flow graft was then pulled through the tunnel. The distal end was approximated to the tibial artery.  The patient was given 7000 units of Heparin intravenously, and this is allowed to circulate for proximally 5 minutes.  The external iliac artery, superficial femoral artery, and profunda femoral artery were then clamped along with any circumflex branches.  Arteriotomy is then made in the common femoral artery with an 11 blade and extended with Potts scissors. Arteriotomy is only in the common femoral artery more proximally just below the level of the ilioinguinal ligament.  The graft was  then approximated to both the posterior tibial artery and the femoral artery with the leg in a straight position.  The proximal end of the distal flow graft was then trimmed and an  end graft to side common femoral artery anastomosis was fashioned using running CV 6 suture.  Flushing maneuvers were performed and flow was reestablished to the profunda femoris and SFA and a angled Fogarty clamp was placed across the distal flow graft just distal to the suture line.  Attention was then turned to the tibial target site. The leg was straightened and final measurements were made. Arteriotomy was made with a 11-blade and extended with Potts scissors.  The distal end of the conduit was spatulated to the appropriate length.  The distal flow graft was then sewn to the to the artery in an end-to-side configuration with a running stitch of CV 6 suture.  Prior to completing the anastomosis, flushing maneuvers were performed and then flow was established distally.  After inspecting the anastomosis for hemostasis, a gauze was placed around the suture line.  Attention was now turned to the profunda femoris.  This is known to have proximal stenosis greater than 70%.  This is separate and distinct from the bypass graft itself in fact in obtaining vascular control the clamp is placed distal to the anastomosis of the bypass.  Profunda clamp was then placed across the distal profunda femoris.  The profunda femoris is then opened with an 11 blade and extended with Potts scissors.  The plaque is then removed using a freer elevator.  Once adequate endarterectomy have been performed a bovine pericardial patch angioplasty is performed using a bovine pericardial patch.  Patch is applied with running 6-0 Prolene.  Flushing maneuvers were performed and flow was established into the profunda femoris.  During this procedure as noted this is separate and distinct and continuous flow was noted in the bypass graft as we were distal to this reconstruction.  Any bleeding areas in the profunda femoris patch were controlled with 6-0 Prolene and pledgeted sutures.  Once hemostasis had been achieved, the wounds were then  irrigated with sterile saline.  The wounds were then inspected for hemostasis. Bleeding points were controlled with electrocautery, and suture repair of active bleeding points.  Antibiotic beads were then created using vancomycin and placed in the bed of both the groin wound as well as the calf wound.  Fibrillar and Vistaseal were then placed in the bed of the wounds. Once hemostasis was achieved.  Decelerate was placed in the groin wound after the deep layer was closed.  The groin incision was then closed in multiple layers using both 2-0 and 3-0 Vicryl in interrupted and running fashion. Skin was closed with a staples. The medial calf and medial thigh wound were then closed layers using  3-0 Vicryl, and staples.   A Prevena disposable incisional VAC dressing was applied to each incision this measures 15 cm in length.  Honeycomb dressing was applied to the calf   COMPLICATIONS: None  CONDITION: Stable  Renford Dills, M.D. [place] [facility] Office: [phone]  10/22/2022, 4:12 PM

## 2022-10-22 NOTE — Transfer of Care (Signed)
Immediate Anesthesia Transfer of Care Note  Patient: [name] CAMPOLI Sr.  Procedure(s) Performed: BYPASS GRAFT FEMORAL-TIBIAL ARTERY (Left) APPLICATION OF CELL SAVER (Left) ENDARTERECTOMY FEMORAL (Left: Groin)  Patient Location: PACU  Anesthesia Type:General  Level of Consciousness: drowsy  Airway & Oxygen Therapy: Patient Spontanous Breathing and Patient connected to face mask oxygen  Post-op Assessment: Report given to RN and Post -op Vital signs reviewed and stable  Post vital signs: Reviewed and stable  Last Vitals:  Vitals Value Taken Time  BP 136/51 10/22/22 1623  Temp 35.9   Pulse 51 10/22/22 1629  Resp 12 10/22/22 1630  SpO2 97 % 10/22/22 1629  Vitals shown include unvalidated device data.  Last Pain:  Vitals:   10/22/22 0843  TempSrc: Temporal  PainSc: 0-No pain         Complications: No notable events documented.

## 2022-10-22 NOTE — Op Note (Signed)
[facility]    OPERATIVE NOTE   PROCEDURE: left common femoral artery to posterior tibial artery bypass with 6 mm Distaflo graft Separate and distinct left profunda femoris endarterectomy with separate bovine pericardial patch angioplasty Incisional (disposable) VAC placement left groin  PRE-OPERATIVE DIAGNOSIS: Atherosclerotic Occlusive Disease with ulceration  POST-OPERATIVE DIAGNOSIS:  Atherosclerotic Occlusive Disease with ulceration  SURGEONS: Renford Dills, M.D. and Festus Barren, MD  ASSISTANT(S): none  ANESTHESIA: general  ESTIMATED BLOOD LOSS: 200 cc  FINDING(S): None   SPECIMEN(S): Left profunda femoris artery plaque  INDICATIONS:   [name] Prom Sr. is a 82 y.o. male who presents with ulceration of the left foot and severe atherosclerotic occlusive disease. The risk, benefits, and alternative for bypass operations were discussed with the patient.  The patient is aware the risks include but are not limited to: bleeding, infection, myocardial infarction, stroke, limb loss, nerve damage, need for additional procedures in the future, wound complications, and inability to complete the bypass.  The patient is voices understanding of these risks and agreed to proceed.  DESCRIPTION: After informed consent was obtained, the patient was brought back to the operating room and placed in the supine position.  Prior to induction, the patient was given intravenous antibiotics.  After general anesthesia is induced, the patient was prepped and draped in the standard fashion for a femoral to popliteal bypass operation.  Appropriate timeout is called.  Co-surgeons are required because this is a complex multilevel procedure with work being performed simultaneously from both the patient's right and left sides.  This also expedites the procedure making a shorter operative time reducing complications and improving patient safety.   Attention was turned to the left groin.  A  longitudinal incision was made over the left common femoral artery.  Using blunt dissection and electrocautery, the artery was dissected circumferentially from the inguinal ligament down to the femoral bifurcation.  The superficial femoral artery, profunda femoral artery, and distal external iliac artery are looped with Silastic vessel loops.  Circumflex branches were also dissected and controlled with vessel loops as needed.   It was noted that the origin of the profunda femoris artery and the most distal common femoral artery were severely diseased.  This would be different from the bypass anastomosis that would be based off of the more proximal to mid common femoral artery, but it was important to preserve good flow through the profunda femoris artery in the case that the bypass graft fails and this may allow a limb salvage.  Separate and distinct treatment of the profunda femoris artery would be required.   Medial incision was then made in the calf and the left posterior tibial artery was identified. It was dissected circumferentially and looped with Silastic vessel loops. A tunneler was then passed and a Bard distal flow graft was then pulled through the tunnel. The distal end was approximated to the tibial artery.  With this medial calf incision, the saphenous vein was dissected out to evaluate for adequacy for bypass.  Preoperative vein mapping had suggested this would be an adequate vein, but on dissection there were actually 3 separate veins in the calf and none of which were adequate for bypass conduit.  Even attempts to pass the valvulotome to the 2 largest veins were unsuccessful due to disease in the veins.  With this, we selected a Distaflo graft. At this point, tunneling maneuvers were performed from the calf incision up to the groin incision.  Care was taken to  localize the femoral condyles and tunneling was taken in a near anatomic location from the calf incision up to the groin incision.  The  6 mm Distaflo graft was then brought through the tunnel and the line was kept up for appropriate orientation.  The patient was given 7000 units of Heparin intravenously, and this is allowed to circulate for proximally 5 minutes.  The external iliac artery, superficial femoral artery, and profunda femoral artery were then clamped along with any circumflex branches.  Arteriotomy is then made in the proximal to mid common femoral artery with an 11 blade and extended with Potts scissors. Arteriotomy is extended to allow for proximal anastomosis. The profunda femoris artery artery was found to be severely atherosclerotic with a large amount of plaque present.  Would be managed with a separate distinct profunda endarterectomy.  The common femoral artery had some plaque, but was adequate for inflow for the bypass.   The proximal extent of the bypass vein was trimmed to the appropriate shape.  The PTFE graft was sewn to the common femoral artery in an end-to-side configuration with a running stitch of Gore CV 5 suture.  Prior to completing the suture line the anastomosis is flushed and subsequently the suture line is completed. Flow was then reestablished to the profunda femoris artery.  The anastomosis is checked for leaks and the graft is clamped proximally. Attention was then turned to the distal end of the graft.  I released the clamp and pulsatile bleeding from the graft was evident.  I reclamped the graft near the arterial anastomosis.  The conduit was irrigated with heparinized saline.    Attention was then turned to the tibial target site. The leg was straightened and final measurements were made. Arteriotomy was made with a 11-blade and extended with Potts scissors.  The distal end of the conduit was preformed in a cobra head fashion from the Distaflo graft.  The Distaflo graft was then sewn to the to the artery in an end-to-side configuration with a running stitch of CV 6.  Prior to completing the  anastomosis, flushing maneuvers were performed and then flow was established distally.  After inspecting the anastomosis for hemostasis, Fibrillar was placed around the suture line.   At this point, there was excellent flow through the bypass graft and we turned our attention to the profunda femoris artery for a separate distinct profunda femoris endarterectomy.  We were able to clamp in the distal common femoral artery with profunda clamp just below the bypass to allow continuous flow through the bypass.  The 2 primary profunda femoris branches were encircled with Vesseloops which were pulled up for control and a small profunda clamp was used in the distal profunda femoris artery below the lesion.  An arteriotomy was then created with an 11 blade and extended with Potts scissors and the profunda femoris artery up to its origin and the most distal portion of the common femoral artery.  A large chunk of very calcific plaque was then removed using the freer elevator and gentle traction.  Hemostats were used to perform an eversion endarterectomy at the origin of the superficial femoral artery with the distal endpoint of the profunda femoris artery created with gentle traction.  The distal endpoint was nice and feathered.  A bovine pericardial patch was then brought onto the field.  It was started at the distal endpoint with 6-0 Prolene and run approximately one half the length of the arteriotomy medial and laterally and the profunda femoris  artery.  It was then cut and beveled to an appropriate length to match the arteriotomy and a second 6-0 Prolene was started at the proximal endpoint near the origin of the profunda femoris artery and the most distal common femoral artery.  This was run medially and laterally and then flushed prior to completing the anastomosis.  On completion of the anastomosis 6-0 Prolene and pledgeted 6-0 Prolene's were used for hemostasis and the profunda femoris artery and hemostasis was  achieved.  There is now an excellent pulse in the profunda femoris artery well distal to the patch angioplasty.  The wounds were then irrigated with sterile saline.  The wounds were then inspected for hemostasis. Bleeding points were controlled with electrocautery, and suture repair of active bleeding points.  Fibrillar and Vistacel were then placed in the bed of the wounds. Vancomycin impregnated beads were placed in the wounds. Once hemostasis was achieved.  The groin incision was then closed in multiple layers using both 2-0 and 3-0 Vicryl in interrupted and running fashion. Celerate was placed in the groin wound after the deep fascial layer was closed over the artery. Skin was closed with a staples. The medial calf and medial thigh wound were then closed layers using  3-0 Vicryl, and staples.   A dry dressing was placed on the calf wound and an incisional (disposable) VAC was placed for the groin wound.  The patient was awake from anesthesia and taken the recovery room in stable condition having tolerated the procedure well.   COMPLICATIONS: None apparent  CONDITION: Stable  Festus Barren, M.D. [facility] Office: [phone]  10/22/2022, 4:12 PM

## 2022-10-22 NOTE — Progress Notes (Signed)
MRN : [id number]  [name] MOM Sr. is a 82 y.o. (12/07/1940) male who presents with chief complaint of check circulation.  History of Present Illness:  The patient presents to [facility] for bypass surgery of the left lower extremity for limb salvage.  The patient is status post angiogram without intervention on 07/15/2022.    Procedure: Diagnostic left lower extremity angiography:   The left common femoral and profunda femoris are diffusely diseased but there are no hemodynamically significant stenoses noted.  The left common femoral demonstrates moderate calcific plaque of approximately 50 to 60%.  The superficial femoral and popliteal arteries are essentially occluded at multiple levels throughout their entire course.  There are small segments that reconstitute but then reocclude.  Trifurcation is profoundly diseased essentially occluded.  There is nonvisualization of the anterior tibial.  The tibioperoneal trunk appears to have hemodynamically significant stenosis.  At the origin of the posterior tibial there appears to be reconstitution from extensive profunda and geniculate collaterals and the posterior tibial artery is of good size and widely patent down to the foot filling the pedal arch.  The peroneal is faintly visualized but does not appear to be of good caliber.      The patient denies improvement in the lower extremity symptoms over the past 60 days which is the interval it has taken to obtain clearance. No interval shortening of the patient's claudication distance or worsening of his baseline rest pain symptoms. No new ulcers or wounds have occurred since the last visit.   There have been no significant changes to the patient's overall health care.   No documented history of amaurosis fugax or recent TIA symptoms. There are no recent neurological changes noted. No documented history of DVT, PE or superficial thrombophlebitis. The patient  denies recent episodes of angina or shortness of breath.    Current Meds  Medication Sig   amiodarone (PACERONE) 200 MG tablet Take 1 tablet (200 mg total) by mouth daily.   gabapentin (NEURONTIN) 300 MG capsule Take 1 capsule (300 mg total) by mouth 2 (two) times daily.   levothyroxine (SYNTHROID) 100 MCG tablet TAKE 1 TABLET BY MOUTH DAILY BEFORE BREAKFAST   lovastatin (MEVACOR) 20 MG tablet TAKE 1 TABLET(20 MG) BY MOUTH DAILY    Past Medical History:  Diagnosis Date   A-fib    a.) CHA2DS2VASc = 5 (age x2, CHF, HTN, vascular disease history);  b.) rate/rhythm maintained on oral amiodarone; chronically anticoagulated with rivaroxaban   Allergy    Arthritis    CAD (coronary artery disease) 11/17/2012   a.) LHC 11/17/2012: 30% mLM, 30% mLAD, 30% mLCx, 50%/30% OM1, 30% pRCA - med mgmt   Chronic systolic CHF (congestive heart failure), NYHA class 2    a.) TTE 05/30/2015: EF 40%, glob HK, mild LVH, mild LAE, mild MR/TR; b.) TTE 10/14/2017: EF 40%, glob HK, mild LVH, mod BAE, triv PR, mild MR/TR, G1DD   Complication of anesthesia    a.) hypotension during routine colonoscopy   COPD (chronic obstructive pulmonary disease)    DDD (degenerative disc disease), lumbar    Dilated cardiomyopathy    a.) LHC 11/17/2012: EF 35%; b.) TTE 05/30/2015: EF 40%; c.) TTE 10/14/2017: EF 40%   GERD (gastroesophageal reflux disease)    History of chicken pox    Hyperlipidemia    Hypertension    Hypothyroidism    Long term current use  of amiodarone    Long term current use of anticoagulant    a.) Rivaroxaban   Osteoporosis    Peripheral vascular disease    Pre-diabetes    Spinal stenosis of lumbar region without neurogenic claudication    Stage 3b chronic kidney disease     Past Surgical History:  Procedure Laterality Date   BACK SURGERY     x5   DOPPLER ECHOCARDIOGRAPHY  06/21/2013   EF=35% while in Sinus Rhythm   LEFT HEART CATH AND CORONARY ANGIOGRAPHY Left 11/17/2012   Procedure: LEFT  HEART CATHETERIZATION AND CORONARY ANGIOGRAPHY; Location: [facility]; Surgeon: Marcina Millard, MD   LOWER EXTREMITY ANGIOGRAPHY Right 05/20/2022   Procedure: Lower Extremity Angiography;  Surgeon: Renford Dills, MD;  Location: [facility] INVASIVE CV LAB;  Service: Cardiovascular;  Laterality: Right;   LOWER EXTREMITY ANGIOGRAPHY Left 07/15/2022   Procedure: Lower Extremity Angiography;  Surgeon: Renford Dills, MD;  Location: [facility] INVASIVE CV LAB;  Service: Cardiovascular;  Laterality: Left;   Myocardial perfusion scan  10/28/2012   severe global LV enlargement. Mild RV enlargement. Mild LVH. Mild mitral and tricuspid insufficency   polyp excision  09/28/2005   UPPER GASTROINTESTINAL ENDOSCOPY  03/27/2004   Gastropathy, Gastritis; Duodenopathy    Social History Social History   Tobacco Use   Smoking status: Former    Packs/day: 2.00    Years: 50.00    Additional pack years: 0.00    Total pack years: 100.00    Types: Cigarettes    Quit date: 07/01/2007    Years since quitting: 15.3   Smokeless tobacco: Never  Vaping Use   Vaping Use: Never used  Substance Use Topics   Alcohol use: No    Alcohol/week: 0.0 standard drinks of alcohol   Drug use: No    Family History Family History  Problem Relation Age of Onset   Alzheimer's disease Mother    Heart attack Father    Colon cancer Brother     No Known Allergies   REVIEW OF SYSTEMS (Negative unless checked)  Constitutional: Weight loss  Fever  Chills Cardiac: Chest pain   Chest pressure   Palpitations   Shortness of breath when laying flat   Shortness of breath with exertion. Vascular:  Pain in legs with walking   Pain in legs at rest  History of DVT   Phlebitis   Swelling in legs   Varicose veins   Non-healing ulcers Pulmonary:   Uses home oxygen   Productive cough   Hemoptysis   Wheeze  COPD   Asthma Neurologic:  Dizziness   Seizures   History of stroke   History of TIA   Aphasia   Vissual changes   Weakness or numbness in arm   Weakness or numbness in leg Musculoskeletal:   Joint swelling   Joint pain   Low back pain Hematologic:  Easy bruising  Easy bleeding   Hypercoagulable state   Anemic Gastrointestinal:  Diarrhea   Vomiting  Gastroesophageal reflux/heartburn   Difficulty swallowing. Genitourinary:  Chronic kidney disease   Difficult urination  Frequent urination   Blood in urine Skin:  Rashes   Ulcers  Psychological:  History of anxiety    History of major depression.  Physical Examination  Vitals:   10/22/22 0843  BP: (!) 133/55  Pulse: 64  Resp: 18  Temp: 98.2 F (36.8 C)  TempSrc: Temporal  SpO2: 97%  Weight: 108.9 kg  Height:  (1.88 m)   Body mass index is 30.81  kg/m. Gen: WD/WN, NAD Head: [place]/AT, No temporalis wasting.  Ear/Nose/Throat: Hearing grossly intact, nares w/o erythema or drainage Eyes: PER, EOMI, sclera nonicteric.  Neck: Supple, no masses.  No bruit or JVD.  Pulmonary:  Good air movement, no audible wheezing, no use of accessory muscles.  Cardiac: RRR, normal S1, S2, no Murmurs. Vascular:  mild trophic changes, left foot with open wounds noninfected Vessel Right Left  Radial Palpable Palpable  PT Not Palpable Not Palpable  DP Not Palpable Not Palpable  Gastrointestinal: soft, non-distended. No guarding/no peritoneal signs.  Musculoskeletal: M/S 5/5 throughout.  No visible deformity.  Neurologic: CN 2-12 intact. Pain and light touch intact in extremities.  Symmetrical.  Speech is fluent. Motor exam as listed above. Psychiatric: Judgment intact, Mood & affect appropriate for pt's clinical situation. Dermatologic: No rashes but left foot ulcers noted.  No changes consistent with cellulitis.   CBC Lab Results  Component Value Date   WBC 7.2 07/23/2022   HGB 10.4 (L) 07/23/2022   HCT 32.7 (L) 07/23/2022   MCV 92.9 07/23/2022   PLT 305 07/23/2022    BMET     Component Value Date/Time   NA 137 07/23/2022 1532   NA 140 01/07/2022 0925   K 4.3 07/23/2022 1532   CL 103 07/23/2022 1532   CO2 26 07/23/2022 1532   GLUCOSE 106 (H) 07/23/2022 1532   BUN 22 07/23/2022 1532   BUN 32 (H) 01/07/2022 0925   CREATININE 2.33 (H) 07/23/2022 1532   CREATININE 1.71 (H) 03/19/2017 0813   CALCIUM 9.0 07/23/2022 1532   GFRNONAA 28 (L) 07/23/2022 1532   GFRNONAA 39 (L) 03/19/2017 0813   GFRAA 39 (L) 04/04/2020 0808   GFRAA 45 (L) 03/19/2017 0813   CrCl cannot be calculated (Patient's most recent lab result is older than the maximum 21 days allowed.).  COAG Lab Results  Component Value Date   INR 1.4 (H) 11/26/2021    Radiology No results found.   Assessment/Plan 1. Atherosclerosis of native arteries of the extremities with ulceration (HCC)  Recommend:   The patient has evidence of severe atherosclerotic changes of both lower extremities associated with ulceration and tissue loss of the left foot.  This represents a limb threatening ischemia and places the patient at a high risk for limb loss.   Angiography has been performed and the situation is not ideal for intervention.  Given this finding open surgical repair is recommended.    Patient should undergo arterial reconstruction, femoral to posterior tibial bypass with in situ GSV of the left lower extremity with the hope for limb salvage.  The risks and benefits as well as the alternative therapies was discussed in detail with the patient.  All questions were answered.  Patient agrees to proceed with open vascular surgical reconstruction.   The patient will follow up with me in the office after the procedure.      2. Edema, unspecified type Recommend:   No surgery or intervention at this point in time.     I have reviewed my discussion with the patient regarding lymphedema and why it  causes symptoms.  Patient will continue wearing graduated compression on a daily basis. The patient should put  the compression on first thing in the morning and removing them in the evening. The patient should not sleep in the compression.    In addition, behavioral modification throughout the day will be continued.  This will include frequent elevation (such as in a recliner), use of over the  counter pain medications as needed and exercise such as walking.   The systemic causes for chronic edema such as liver, kidney and cardiac etiologies does not appear to have significant changed over the past year.     The patient will continue aggressive use of the  lymph pump.  This will continue to improve the edema control and prevent sequela such as ulcers and infections.    The patient will follow-up with me on an annual basis.    3. Primary hypertension Continue antihypertensive medications as already ordered, these medications have been reviewed and there are no changes at this time. - Ambulatory referral to Cardiology   4. Longstanding persistent atrial fibrillation (HCC) Continue antiarrhythmia medications as already ordered, these medications have been reviewed and there are no changes at this time.   Continue anticoagulation as ordered by Cardiology Service - Ambulatory referral to Cardiology   5. Lumbar radiculopathy, chronic Continue NSAID medications as already ordered, these medications have been reviewed and there are no changes at this time.   Continued activity and therapy was stressed.   Levora Dredge, MD  10/22/2022 10:20 AM

## 2022-10-22 NOTE — H&P (View-Only) (Signed)
               MRN : [id number]  [name] D Petties Sr. is a 82 y.o. (03/30/1941) male who presents with chief complaint of check circulation.  History of Present Illness:  The patient presents to [facility] for bypass surgery of the left lower extremity for limb salvage.  The patient is status post angiogram without intervention on 07/15/2022.    Procedure: Diagnostic left lower extremity angiography:   The left common femoral and profunda femoris are diffusely diseased but there are no hemodynamically significant stenoses noted.  The left common femoral demonstrates moderate calcific plaque of approximately 50 to 60%.  The superficial femoral and popliteal arteries are essentially occluded at multiple levels throughout their entire course.  There are small segments that reconstitute but then reocclude.  Trifurcation is profoundly diseased essentially occluded.  There is nonvisualization of the anterior tibial.  The tibioperoneal trunk appears to have hemodynamically significant stenosis.  At the origin of the posterior tibial there appears to be reconstitution from extensive profunda and geniculate collaterals and the posterior tibial artery is of good size and widely patent down to the foot filling the pedal arch.  The peroneal is faintly visualized but does not appear to be of good caliber.      The patient denies improvement in the lower extremity symptoms over the past 60 days which is the interval it has taken to obtain clearance. No interval shortening of the patient's claudication distance or worsening of his baseline rest pain symptoms. No new ulcers or wounds have occurred since the last visit.   There have been no significant changes to the patient's overall health care.   No documented history of amaurosis fugax or recent TIA symptoms. There are no recent neurological changes noted. No documented history of DVT, PE or superficial thrombophlebitis. The patient  denies recent episodes of angina or shortness of breath.    Current Meds  Medication Sig   amiodarone (PACERONE) 200 MG tablet Take 1 tablet (200 mg total) by mouth daily.   gabapentin (NEURONTIN) 300 MG capsule Take 1 capsule (300 mg total) by mouth 2 (two) times daily.   levothyroxine (SYNTHROID) 100 MCG tablet TAKE 1 TABLET BY MOUTH DAILY BEFORE BREAKFAST   lovastatin (MEVACOR) 20 MG tablet TAKE 1 TABLET(20 MG) BY MOUTH DAILY    Past Medical History:  Diagnosis Date   A-fib    a.) CHA2DS2VASc = 5 (age x2, CHF, HTN, vascular disease history);  b.) rate/rhythm maintained on oral amiodarone; chronically anticoagulated with rivaroxaban   Allergy    Arthritis    CAD (coronary artery disease) 11/17/2012   a.) LHC 11/17/2012: 30% mLM, 30% mLAD, 30% mLCx, 50%/30% OM1, 30% pRCA - med mgmt   Chronic systolic CHF (congestive heart failure), NYHA class 2    a.) TTE 05/30/2015: EF 40%, glob HK, mild LVH, mild LAE, mild MR/TR; b.) TTE 10/14/2017: EF 40%, glob HK, mild LVH, mod BAE, triv PR, mild MR/TR, G1DD   Complication of anesthesia    a.) hypotension during routine colonoscopy   COPD (chronic obstructive pulmonary disease)    DDD (degenerative disc disease), lumbar    Dilated cardiomyopathy    a.) LHC 11/17/2012: EF 35%; b.) TTE 05/30/2015: EF 40%; c.) TTE 10/14/2017: EF 40%   GERD (gastroesophageal reflux disease)    History of chicken pox    Hyperlipidemia    Hypertension    Hypothyroidism    Long term current use   of amiodarone    Long term current use of anticoagulant    a.) Rivaroxaban   Osteoporosis    Peripheral vascular disease    Pre-diabetes    Spinal stenosis of lumbar region without neurogenic claudication    Stage 3b chronic kidney disease     Past Surgical History:  Procedure Laterality Date   BACK SURGERY     x5   DOPPLER ECHOCARDIOGRAPHY  06/21/2013   EF=35% while in Sinus Rhythm   LEFT HEART CATH AND CORONARY ANGIOGRAPHY Left 11/17/2012   Procedure: LEFT  HEART CATHETERIZATION AND CORONARY ANGIOGRAPHY; Location: [facility]; Surgeon: Alexander Paraschos, MD   LOWER EXTREMITY ANGIOGRAPHY Right 05/20/2022   Procedure: Lower Extremity Angiography;  Surgeon: Kellon Chalk G, MD;  Location: [facility] INVASIVE CV LAB;  Service: Cardiovascular;  Laterality: Right;   LOWER EXTREMITY ANGIOGRAPHY Left 07/15/2022   Procedure: Lower Extremity Angiography;  Surgeon: Mykael Trott G, MD;  Location: [facility] INVASIVE CV LAB;  Service: Cardiovascular;  Laterality: Left;   Myocardial perfusion scan  10/28/2012   severe global LV enlargement. Mild RV enlargement. Mild LVH. Mild mitral and tricuspid insufficency   polyp excision  09/28/2005   UPPER GASTROINTESTINAL ENDOSCOPY  03/27/2004   Gastropathy, Gastritis; Duodenopathy    Social History Social History   Tobacco Use   Smoking status: Former    Packs/day: 2.00    Years: 50.00    Additional pack years: 0.00    Total pack years: 100.00    Types: Cigarettes    Quit date: 07/01/2007    Years since quitting: 15.3   Smokeless tobacco: Never  Vaping Use   Vaping Use: Never used  Substance Use Topics   Alcohol use: No    Alcohol/week: 0.0 standard drinks of alcohol   Drug use: No    Family History Family History  Problem Relation Age of Onset   Alzheimer's disease Mother    Heart attack Father    Colon cancer Brother     No Known Allergies   REVIEW OF SYSTEMS (Negative unless checked)  Constitutional: []Weight loss  []Fever  []Chills Cardiac: []Chest pain   []Chest pressure   []Palpitations   []Shortness of breath when laying flat   []Shortness of breath with exertion. Vascular:  [x]Pain in legs with walking   []Pain in legs at rest  []History of DVT   []Phlebitis   []Swelling in legs   []Varicose veins   []Non-healing ulcers Pulmonary:   []Uses home oxygen   []Productive cough   []Hemoptysis   []Wheeze  []COPD   []Asthma Neurologic:  []Dizziness   []Seizures   []History of stroke   []History of TIA   []Aphasia   []Vissual changes   []Weakness or numbness in arm   []Weakness or numbness in leg Musculoskeletal:   []Joint swelling   []Joint pain   []Low back pain Hematologic:  []Easy bruising  []Easy bleeding   []Hypercoagulable state   []Anemic Gastrointestinal:  []Diarrhea   []Vomiting  []Gastroesophageal reflux/heartburn   []Difficulty swallowing. Genitourinary:  []Chronic kidney disease   []Difficult urination  []Frequent urination   []Blood in urine Skin:  []Rashes   []Ulcers  Psychological:  []History of anxiety   [] History of major depression.  Physical Examination  Vitals:   10/22/22 0843  BP: (!) 133/55  Pulse: 64  Resp: 18  Temp: 98.2 F (36.8 C)  TempSrc: Temporal  SpO2: 97%  Weight: 108.9 kg  Height: 6' 2" (1.88 m)   Body mass index is 30.81   kg/m. Gen: WD/WN, NAD Head: [place]/AT, No temporalis wasting.  Ear/Nose/Throat: Hearing grossly intact, nares w/o erythema or drainage Eyes: PER, EOMI, sclera nonicteric.  Neck: Supple, no masses.  No bruit or JVD.  Pulmonary:  Good air movement, no audible wheezing, no use of accessory muscles.  Cardiac: RRR, normal S1, S2, no Murmurs. Vascular:  mild trophic changes, left foot with open wounds noninfected Vessel Right Left  Radial Palpable Palpable  PT Not Palpable Not Palpable  DP Not Palpable Not Palpable  Gastrointestinal: soft, non-distended. No guarding/no peritoneal signs.  Musculoskeletal: M/S 5/5 throughout.  No visible deformity.  Neurologic: CN 2-12 intact. Pain and light touch intact in extremities.  Symmetrical.  Speech is fluent. Motor exam as listed above. Psychiatric: Judgment intact, Mood & affect appropriate for pt's clinical situation. Dermatologic: No rashes but left foot ulcers noted.  No changes consistent with cellulitis.   CBC Lab Results  Component Value Date   WBC 7.2 07/23/2022   HGB 10.4 (L) 07/23/2022   HCT 32.7 (L) 07/23/2022   MCV 92.9 07/23/2022   PLT 305 07/23/2022    BMET     Component Value Date/Time   NA 137 07/23/2022 1532   NA 140 01/07/2022 0925   K 4.3 07/23/2022 1532   CL 103 07/23/2022 1532   CO2 26 07/23/2022 1532   GLUCOSE 106 (H) 07/23/2022 1532   BUN 22 07/23/2022 1532   BUN 32 (H) 01/07/2022 0925   CREATININE 2.33 (H) 07/23/2022 1532   CREATININE 1.71 (H) 03/19/2017 0813   CALCIUM 9.0 07/23/2022 1532   GFRNONAA 28 (L) 07/23/2022 1532   GFRNONAA 39 (L) 03/19/2017 0813   GFRAA 39 (L) 04/04/2020 0808   GFRAA 45 (L) 03/19/2017 0813   CrCl cannot be calculated (Patient's most recent lab result is older than the maximum 21 days allowed.).  COAG Lab Results  Component Value Date   INR 1.4 (H) 11/26/2021    Radiology No results found.   Assessment/Plan 1. Atherosclerosis of native arteries of the extremities with ulceration (HCC)  Recommend:   The patient has evidence of severe atherosclerotic changes of both lower extremities associated with ulceration and tissue loss of the left foot.  This represents a limb threatening ischemia and places the patient at a high risk for limb loss.   Angiography has been performed and the situation is not ideal for intervention.  Given this finding open surgical repair is recommended.    Patient should undergo arterial reconstruction, femoral to posterior tibial bypass with in situ GSV of the left lower extremity with the hope for limb salvage.  The risks and benefits as well as the alternative therapies was discussed in detail with the patient.  All questions were answered.  Patient agrees to proceed with open vascular surgical reconstruction.   The patient will follow up with me in the office after the procedure.      2. Edema, unspecified type Recommend:   No surgery or intervention at this point in time.     I have reviewed my discussion with the patient regarding lymphedema and why it  causes symptoms.  Patient will continue wearing graduated compression on a daily basis. The patient should put  the compression on first thing in the morning and removing them in the evening. The patient should not sleep in the compression.    In addition, behavioral modification throughout the day will be continued.  This will include frequent elevation (such as in a recliner), use of over the   counter pain medications as needed and exercise such as walking.   The systemic causes for chronic edema such as liver, kidney and cardiac etiologies does not appear to have significant changed over the past year.     The patient will continue aggressive use of the  lymph pump.  This will continue to improve the edema control and prevent sequela such as ulcers and infections.    The patient will follow-up with me on an annual basis.    3. Primary hypertension Continue antihypertensive medications as already ordered, these medications have been reviewed and there are no changes at this time. - Ambulatory referral to Cardiology   4. Longstanding persistent atrial fibrillation (HCC) Continue antiarrhythmia medications as already ordered, these medications have been reviewed and there are no changes at this time.   Continue anticoagulation as ordered by Cardiology Service - Ambulatory referral to Cardiology   5. Lumbar radiculopathy, chronic Continue NSAID medications as already ordered, these medications have been reviewed and there are no changes at this time.   Continued activity and therapy was stressed.   Elai Vanwyk, MD  10/22/2022 10:20 AM   

## 2022-10-22 NOTE — Anesthesia Preprocedure Evaluation (Addendum)
Anesthesia Evaluation  Patient identified by MRN, date of birth, ID band Patient awake    Reviewed: Allergy & Precautions, NPO status , Patient's Chart, lab work & pertinent test results  History of Anesthesia Complications (+) PROLONGED EMERGENCE and history of anesthetic complications (from colonoscopy)  Airway Mallampati: III   Neck ROM: Full    Dental  (+) Upper Dentures, Lower Dentures   Pulmonary COPD, former smoker (quit 2009)   Pulmonary exam normal breath sounds clear to auscultation       Cardiovascular hypertension, + CAD, + Peripheral Vascular Disease and +CHF (NICM, EF 40%)  Normal cardiovascular exam+ dysrhythmias (a fib on Xarelto)  Rhythm:Regular Rate:Normal  ECG 10/15/22:  Sinus rhythm with frequent Premature atrial complexes , occasional pvc Left axis deviation Low voltage QRS   Neuro/Psych negative neurological ROS     GI/Hepatic negative GI ROS,,,  Endo/Other  Hypothyroidism  Prediabetes; obesity  Renal/GU Renal disease (stage III CKD)     Musculoskeletal   Abdominal   Peds  Hematology negative hematology ROS (+)   Anesthesia Other Findings Reviewed and agree with Edd Fabian pre-anesthesia clinical review note.    Cardiology note 03/18/22:  82 year old gentleman with non-ischemic dilated cardiomyopathy, chronic systolic congestive heart failure with LVEF 40%, with no complaints of exertional dyspnea, with chronic peripheral edema, on torsemide. He was treated for right lower extremity cellulitis in 10/2021, with recurrent cellulitis in 11/2021, followed by wound clinic and vascular. Patient has paroxysmal atrial fibrillation, currently on Xarelto for stroke prevention, and amiodarone for rhythm control. The patient has essential hypertension, blood pressure well controlled on current BP medications. He has CKD and is scheduled to see nephrology in 2 months.  Plan   1. Continue current  medications 2. Continue torsemide 20 mg BID 3. Continue low sodium/low sugar diet 4. Advised patient to elevate leg when resting 5. Continue lovastatin for hyperlipidemia management 6. Continue Xarelto for stroke prevention 7. Amio toxicity monitoring labs every 6 months 8. Chest xray due 04/2022 9. Return to clinic as scheduled in 4 months    Reproductive/Obstetrics                             Anesthesia Physical Anesthesia Plan  ASA: 4  Anesthesia Plan: General   Post-op Pain Management:    Induction: Intravenous  PONV Risk Score and Plan: 2 and Ondansetron, Dexamethasone and Treatment may vary due to age or medical condition  Airway Management Planned: Oral ETT  Additional Equipment:   Intra-op Plan:   Post-operative Plan: Extubation in OR  Informed Consent: I have reviewed the patients History and Physical, chart, labs and discussed the procedure including the risks, benefits and alternatives for the proposed anesthesia with the patient or authorized representative who has indicated his/her understanding and acceptance.   Patient has DNR.  Discussed DNR with patient and Continue DNR.   Dental advisory given  Plan Discussed with: CRNA  Anesthesia Plan Comments: (Patient consented for risks of anesthesia including but not limited to:  - adverse reactions to medications - damage to eyes, teeth, lips or other oral mucosa - nerve damage due to positioning  - sore throat or hoarseness - damage to heart, brain, nerves, lungs, other parts of body or loss of life  Informed patient about role of CRNA in peri- and intra-operative care.  Patient voiced understanding.)        Anesthesia Quick Evaluation

## 2022-10-22 NOTE — Interval H&P Note (Signed)
History and Physical Interval Note:  10/22/2022 10:24 AM  [name] Prom Sr.  has presented today for surgery, with the diagnosis of ASO WITH ULCERATION.  The various methods of treatment have been discussed with the patient and family. After consideration of risks, benefits and other options for treatment, the patient has consented to  Procedure(s): BYPASS GRAFT FEMORAL-TIBIAL ARTERY (Left) APPLICATION OF CELL SAVER (Left) as a surgical intervention.  The patient's history has been reviewed, patient examined, no change in status, stable for surgery.  I have reviewed the patient's chart and labs.  Questions were answered to the patient's satisfaction.     Levora Dredge

## 2022-10-23 ENCOUNTER — Encounter: Payer: Self-pay | Admitting: Vascular Surgery

## 2022-10-23 LAB — CBC
HCT: 29.8 % — ABNORMAL LOW (ref 39.0–52.0)
Hemoglobin: 9.4 g/dL — ABNORMAL LOW (ref 13.0–17.0)
MCH: 29 pg (ref 26.0–34.0)
MCHC: 31.5 g/dL (ref 30.0–36.0)
MCV: 92 fL (ref 80.0–100.0)
Platelets: 173 10*3/uL (ref 150–400)
RBC: 3.24 MIL/uL — ABNORMAL LOW (ref 4.22–5.81)
RDW: 15.7 % — ABNORMAL HIGH (ref 11.5–15.5)
WBC: 7.8 10*3/uL (ref 4.0–10.5)
nRBC: 0 % (ref 0.0–0.2)

## 2022-10-23 LAB — BASIC METABOLIC PANEL
Anion gap: 6 (ref 5–15)
BUN: 29 mg/dL — ABNORMAL HIGH (ref 8–23)
CO2: 24 mmol/L (ref 22–32)
Calcium: 8.2 mg/dL — ABNORMAL LOW (ref 8.9–10.3)
Chloride: 102 mmol/L (ref 98–111)
Creatinine, Ser: 1.98 mg/dL — ABNORMAL HIGH (ref 0.61–1.24)
GFR, Estimated: 34 mL/min — ABNORMAL LOW (ref >60)
Glucose, Bld: 184 mg/dL — ABNORMAL HIGH (ref 70–99)
Potassium: 4.8 mmol/L (ref 3.5–5.1)
Sodium: 132 mmol/L — ABNORMAL LOW (ref 135–145)

## 2022-10-23 MED ORDER — FAMOTIDINE 20 MG PO TABS
20.0000 mg | ORAL_TABLET | Freq: Two times a day (BID) | ORAL | Status: DC
  Administered 2022-10-23 – 2022-10-27 (×8): 20 mg via ORAL
  Filled 2022-10-23 (×8): qty 1

## 2022-10-23 NOTE — Evaluation (Signed)
Physical Therapy Evaluation Patient Details Name: [name] LIOTTA Sr. MRN: [id number] DOB: [birth year] Today's Date: 10/23/2022  History of Present Illness  admitted for acute hospitalization s/p L LE femoral to tibial artery bypass (10/23/22)  Clinical Impression  Patient resting in bed upon arrival to room; sister-in-law present at bedside.  Patient alert and oriented, follows commands and eager for OOB activities.  Endorses minimal pain in L LE; does note generalized swelling throughout, but improved sensory awareness of L LE/foot since procedure.  L LE strength (3-/5) and ROM (grossly 80 degrees hip/knee flexion, L ankle DF lacking 5-10 degrees at baseline) grossly [facility] for basic transfers and gait.   Currently requiring min assist for bed mobility; min/mod assist for sit/stand with RW; min assist for bed/chair transfer with RW.  Requires multiple attempts for initial sit/stand, does require use of bilat UEs; L LE slightly anterior to BOS to minimize pain and end-range ROM (for pain management). Demonstrates 3-point, step to stepping pattern with bed/chair transfer; heavily reliant on RW with baseline drop foot/steppage gait L LE. Additional gait deferred due to pain/generalized weakness with initial OOB effort. Will continue to assess/progress mobility in subsequent sessions as appropriate. Would benefit from skilled PT to address above deficits and promote optimal return to PLOF; will continue to follow acutely.     Recommendations for follow up therapy are one component of a multi-disciplinary discharge planning process, led by the attending physician.  Recommendations may be updated based on patient status, additional functional criteria and insurance authorization.  Follow Up Recommendations       Assistance Recommended at Discharge Intermittent Supervision/Assistance  Patient can return home with the following  A lot of help with bathing/dressing/bathroom;A lot of help with walking and/or  transfers    Equipment Recommendations Rolling walker (2 wheels)  Recommendations for Other Services       Functional Status Assessment Patient has had a recent decline in their functional status and demonstrates the ability to make significant improvements in function in a reasonable and predictable amount of time.     Precautions / Restrictions Precautions Precautions: Fall Restrictions Weight Bearing Restrictions: No      Mobility  Bed Mobility Overal bed mobility: Needs Assistance Bed Mobility: Supine to Sit     Supine to sit: Min assist          Transfers Overall transfer level: Needs assistance Equipment used: Rolling walker (2 wheels) Transfers: Sit to/from Stand, Bed to chair/wheelchair/BSC Sit to Stand: Min assist, Mod assist Stand pivot transfers: Min assist         General transfer comment: multiple attempts for initial sit/stand, does require use of bilat UEs; L LE slightly anterior to BOS to minimize pain and end-range ROM (for pain management).  3-point, step to stepping pattern with bed/chair transfer; heavily reliant on RW with baseline drop foot/steppage gait L LE.  Additional gait deferred due to pain/generalized weakness with initial OOB effort    Ambulation/Gait               General Gait Details: deferred  Stairs            Wheelchair Mobility    Modified Rankin (Stroke Patients Only)       Balance Overall balance assessment: Needs assistance Sitting-balance support: No upper extremity supported, Feet supported Sitting balance-Leahy Scale: Good     Standing balance support: Bilateral upper extremity supported Standing balance-Leahy Scale: Fair  Pertinent Vitals/Pain Pain Assessment Pain Assessment: No/denies pain    Home Living Family/patient expects to be discharged to:: Private residence Living Arrangements: Children Available Help at Discharge: Family;Available  PRN/intermittently Type of Home: House Home Access: Stairs to enter Entrance Stairs-Rails: None Entrance Stairs-Number of Steps: 2   Home Layout: One level        Prior Function Prior Level of Function : Independent/Modified Independent             Mobility Comments: Mod indep with 4WRW for ADLs, household and community mobilization with [phone]; denies fall history.  Baseline L LE drop foot secondary to previous back surgery (does not wear brace)       Hand Dominance   Dominant Hand: Right    Extremity/Trunk Assessment   Upper Extremity Assessment Upper Extremity Assessment: Overall WFL for tasks assessed    Lower Extremity Assessment Lower Extremity Assessment: Generalized weakness (R LE grossly WFL; L LE grossly 3-/5, hip/knee flexion to approx 80 degrees (limited by post-op edema and soreness with ROM))       Communication   Communication: No difficulties  Cognition Arousal/Alertness: Awake/alert Behavior During Therapy: WFL for tasks assessed/performed Overall Cognitive Status: Within Functional Limits for tasks assessed                                 General Comments: very pleasant, engaged and motivated; eager for [employer] Comments      Exercises Other Exercises Other Exercises: Educated in seated LE therex for strength, flexibility and edema management; patient voiced/demonstrated understanding of all information.   Assessment/Plan    PT Assessment Patient needs continued PT services  PT Problem List Decreased strength;Decreased range of motion;Decreased activity tolerance;Decreased balance;Decreased mobility;Decreased coordination;Decreased safety awareness;Decreased knowledge of precautions       PT Treatment Interventions DME instruction;Gait training;Stair training;Functional mobility training;Therapeutic activities;Therapeutic exercise;Balance training;Patient/family education    PT Goals (Current goals can be found in  the Care Plan section)  Acute Rehab PT Goals Patient Stated Goal: to return home PT Goal Formulation: With patient/family Time For Goal Achievement: 11/06/22 Potential to Achieve Goals: Good    Frequency Min 4X/week     Co-evaluation               AM-PAC PT "6 Clicks" Mobility  Outcome Measure Help needed turning from your back to your side while in a flat bed without using bedrails?: None Help needed moving from lying on your back to sitting on the side of a flat bed without using bedrails?: A Little Help needed moving to and from a bed to a chair (including a wheelchair)?: A Little Help needed standing up from a chair using your arms (e.g., wheelchair or bedside chair)?: A Little Help needed to walk in hospital room?: A Lot Help needed climbing 3-5 steps with a railing? : A Lot 6 Click Score: 17    End of Session Equipment Utilized During Treatment: Gait belt Activity Tolerance: Patient tolerated treatment well Patient left: in chair;with call bell/phone within reach;with family/visitor present Nurse Communication: Mobility status PT Visit Diagnosis: Muscle weakness (generalized) (M62.81);Difficulty in walking, not elsewhere classified (R26.2)    Time: 9528-4132 PT Time Calculation (min) (ACUTE ONLY): 19 min   Charges:   PT Evaluation $PT Eval Moderate Complexity: 1 Mod         Stevee Valenta H. Manson Passey, PT, DPT, NCS 10/23/22, 11:51 AM [phone]

## 2022-10-23 NOTE — Progress Notes (Signed)
Progress Note    10/23/2022 8:22 AM 1 Day Post-Op  Subjective: [name] is an 82 year old male who is now postop day 1 from a left femoral to tibial artery bypass.  He has a medical history of atrial fibrillation, hypothyroidism, hypertension, congestive heart failure, dilated cardiomyopathy, stage IV chronic kidney disease, and PAD.  On exam this morning patient is resting comfortably in ICU.  Patient is noted to have bilateral lower extremity Doppler pulses +2.  Bilateral lower extremities are warm to touch.  He has 2 incisions on his left lower extremity 1 at his calf covered with a dressing is clean and dry.  1 in his left upper thigh/groin with Prevena wound VAC in place.  Wound VAC is working properly.  Patient endorses no complaints this morning.  Patient's vitals all remained stable.   Vitals:   10/23/22 0700 10/23/22 0800  BP:  (!) 112/49  Pulse: 68 68  Resp: 15 14  Temp:    SpO2: 91% 94%   Physical Exam: Cardiac:  AFIB, Controlled in 70's Lungs: On auscultation clear throughout, diminished in the bases, nonproductive surgical cough Incisions: Left lower extremity thigh with Prevena wound VAC left lower extremity calf with dressing clean dry and intact no hematoma seroma to note. Extremities: Bilateral lower extremities warm to touch and positive +2 Doppler pulses throughout. Abdomen: Positive bowel sounds in all 4 quadrants, soft, flat, nontender, nondistended. Neurologic: Alert and oriented x 3, follows all commands appropriately.  CBC    Component Value Date/Time   WBC 7.8 10/23/2022 0002   RBC 3.24 (L) 10/23/2022 0002   HGB 9.4 (L) 10/23/2022 0002   HGB 11.3 (L) 01/07/2022 0925   HCT 29.8 (L) 10/23/2022 0002   HCT 34.2 (L) 01/07/2022 0925   PLT 173 10/23/2022 0002   PLT 226 01/07/2022 0925   MCV 92.0 10/23/2022 0002   MCV 94 01/07/2022 0925   MCH 29.0 10/23/2022 0002   MCHC 31.5 10/23/2022 0002   RDW 15.7 (H) 10/23/2022 0002   RDW 14.6 01/07/2022  0925   LYMPHSABS 1.0 01/07/2022 0925   MONOABS 0.9 11/26/2021 1038   EOSABS 0.1 01/07/2022 0925   BASOSABS 0.1 01/07/2022 0925    BMET    Component Value Date/Time   NA 132 (L) 10/23/2022 0002   NA 140 01/07/2022 0925   K 4.8 10/23/2022 0002   CL 102 10/23/2022 0002   CO2 24 10/23/2022 0002   GLUCOSE 184 (H) 10/23/2022 0002   BUN 29 (H) 10/23/2022 0002   BUN 32 (H) 01/07/2022 0925   CREATININE 1.98 (H) 10/23/2022 0002   CREATININE 1.71 (H) 03/19/2017 0813   CALCIUM 8.2 (L) 10/23/2022 0002   GFRNONAA 34 (L) 10/23/2022 0002   GFRNONAA 39 (L) 03/19/2017 0813   GFRAA 39 (L) 04/04/2020 0808   GFRAA 45 (L) 03/19/2017 0813    INR    Component Value Date/Time   INR 1.4 (H) 11/26/2021 1038     Intake/Output Summary (Last 24 hours) at 10/23/2022 4098 Last data filed at 10/23/2022 0810 Gross per 24 hour  Intake 3236.54 ml  Output 2255 ml  Net 981.54 ml     Assessment/Plan:  82 y.o. male is s/p left lower extremity femoral to tibial bypass surgery.  1 Day Post-Op   PLAN: Advance diet as tolerated OOB to chair with PT eval today Patient started on aspirin 81 mg daily and Plavix 75 mg daily Remove Foley catheter Transfer to the floor later today if doing well. Daily  CBC and BMP.  Monitor hemoglobin and hematocrit and electrolytes.   DVT prophylaxis: ASA 81 mg daily, Plavix 75 mg daily   Marcie [facility] 10/23/2022 8:22 AM

## 2022-10-23 NOTE — Anesthesia Postprocedure Evaluation (Signed)
Anesthesia Post Note  Patient: [name] HEINTZELMAN Sr.  Procedure(s) Performed: BYPASS GRAFT FEMORAL-TIBIAL ARTERY (Left) APPLICATION OF CELL SAVER (Left) ENDARTERECTOMY FEMORAL (Left: Groin)  Patient location during evaluation: ICU Anesthesia Type: General Level of consciousness: awake and alert Pain management: pain level controlled Vital Signs Assessment: post-procedure vital signs reviewed and stable Respiratory status: spontaneous breathing, nonlabored ventilation, respiratory function stable and patient connected to nasal cannula oxygen Cardiovascular status: blood pressure returned to baseline and stable Postop Assessment: no apparent nausea or vomiting Anesthetic complications: no  No notable events documented.   Last Vitals:  Vitals:   10/23/22 0700 10/23/22 0800  BP:  (!) 112/49  Pulse: 68 68  Resp: 15 14  Temp:    SpO2: 91% 94%    Last Pain:  Vitals:   10/23/22 0800  TempSrc:   PainSc: 0-No pain                 Starling Manns

## 2022-10-23 NOTE — Evaluation (Signed)
Occupational Therapy Evaluation Patient Details Name: [name] MCAULAY Sr. MRN: [id number] DOB: 1941-05-24 Today's Date: 10/23/2022   History of Present Illness admitted for acute hospitalization s/p L LE femoral to tibial artery bypass (10/23/22)   Clinical Impression   Mr Sudbury was seen for OT evaluation this date. Prior to hospital admission, pt was MOD I. Pt lives with son. Pt presents to acute OT demonstrating impaired ADL performance and functional mobility 2/2 decreased activity tolerance and functional strength/ROM deficits. Pt currently requires CGA+ RW sit<>stand and ~40 ft mobility, heavy use of RW, cues for technique. MIN A for LB access seated EOB. Pt would benefit from skilled OT to address noted impairments and functional limitations (see below for any additional details). Upon hospital discharge, recommend follow up therapy.    Recommendations for follow up therapy are one component of a multi-disciplinary discharge planning process, led by the attending physician.  Recommendations may be updated based on patient status, additional functional criteria and insurance authorization.   Assistance Recommended at Discharge Intermittent Supervision/Assistance  Patient can return home with the following A little help with walking and/or transfers;A little help with bathing/dressing/bathroom;Help with stairs or ramp for entrance    Functional Status Assessment  Patient has had a recent decline in their functional status and demonstrates the ability to make significant improvements in function in a reasonable and predictable amount of time.  Equipment Recommendations  BSC/3in1    Recommendations for Other Services       Precautions / Restrictions Precautions Precautions: Fall Restrictions Weight Bearing Restrictions: No      Mobility Bed Mobility Overal bed mobility: Needs Assistance Bed Mobility: Sit to Supine       Sit to supine: Supervision         Transfers Overall transfer level: Needs assistance Equipment used: Rolling walker (2 wheels) Transfers: Sit to/from Stand, Bed to chair/wheelchair/BSC Sit to Stand: Min guard           General transfer comment: 2 attempts to rise from chair height      Balance Overall balance assessment: Needs assistance Sitting-balance support: No upper extremity supported, Feet supported Sitting balance-Leahy Scale: Good     Standing balance support: Bilateral upper extremity supported Standing balance-Leahy Scale: Fair                             ADL either performed or assessed with clinical judgement   ADL Overall ADL's : Needs assistance/impaired                                       General ADL Comments: MIN A for LB access seated EOB. SBA + RW simulated toilet t/f.      Pertinent Vitals/Pain Pain Assessment Pain Assessment: No/denies pain     Hand Dominance Right   Extremity/Trunk Assessment Upper Extremity Assessment Upper Extremity Assessment: Overall WFL for tasks assessed   Lower Extremity Assessment Lower Extremity Assessment: Generalized weakness       Communication Communication Communication: No difficulties   Cognition Arousal/Alertness: Awake/alert Behavior During Therapy: WFL for tasks assessed/performed Overall Cognitive Status: Within Functional Limits for tasks assessed  Home Living Family/patient expects to be discharged to:: Private residence Living Arrangements: Children Available Help at Discharge: Family;Available PRN/intermittently Type of Home: House Home Access: Stairs to enter [employer] of Steps: 2 Entrance Stairs-Rails: None Home Layout: One level               Home Equipment: Rollator (4 wheels)          Prior Functioning/Environment Prior Level of Function : Independent/Modified Independent              Mobility Comments: Mod indep with 4WRW for ADLs, household and community mobilization with [phone]; denies fall history.  Baseline L LE drop foot secondary to previous back surgery (does not wear brace)          OT Problem List: Decreased strength;Decreased range of motion;Decreased activity tolerance;Impaired balance (sitting and/or standing);Decreased safety awareness      OT Treatment/Interventions: Self-care/ADL training;Therapeutic exercise;Energy conservation;DME and/or AE instruction;Therapeutic activities;Balance training;Patient/family education    OT Goals(Current goals can be found in the care plan section) Acute Rehab OT Goals Patient Stated Goal: to go home OT Goal Formulation: With patient Time For Goal Achievement: 11/06/22 Potential to Achieve Goals: Good ADL Goals Pt Will Perform Grooming: with modified independence;standing Pt Will Perform Lower Body Dressing: with modified independence;with adaptive equipment;sit to/from stand Pt Will Transfer to Toilet: with modified independence;ambulating;regular height toilet  OT Frequency: Min 2X/week    Co-evaluation              AM-PAC OT "6 Clicks" Daily Activity     Outcome Measure Help from another person eating meals?: None Help from another person taking care of personal grooming?: A Little Help from another person toileting, which includes using toliet, bedpan, or urinal?: A Little Help from another person bathing (including washing, rinsing, drying)?: A Lot Help from another person to put on and taking off regular upper body clothing?: None Help from another person to put on and taking off regular lower body clothing?: A Little 6 Click Score: 19   End of Session    Activity Tolerance: Patient tolerated treatment well Patient left: in bed;with call bell/phone within reach  OT Visit Diagnosis: Other abnormalities of gait and mobility (R26.89);Muscle weakness (generalized) (M62.81)                Time:  1191-4782 OT Time Calculation (min): 14 min Charges:  OT General Charges $OT Visit: 1 Visit OT Evaluation $OT Eval Moderate Complexity: 1 Mod  Kathie Dike, M.S. OTR/L  10/23/22, 4:29 PM  ascom [phone]

## 2022-10-24 LAB — BASIC METABOLIC PANEL
Anion gap: 7 (ref 5–15)
BUN: 31 mg/dL — ABNORMAL HIGH (ref 8–23)
CO2: 26 mmol/L (ref 22–32)
Calcium: 8.5 mg/dL — ABNORMAL LOW (ref 8.9–10.3)
Chloride: 103 mmol/L (ref 98–111)
Creatinine, Ser: 1.85 mg/dL — ABNORMAL HIGH (ref 0.61–1.24)
GFR, Estimated: 36 mL/min — ABNORMAL LOW (ref >60)
Glucose, Bld: 116 mg/dL — ABNORMAL HIGH (ref 70–99)
Potassium: 4.6 mmol/L (ref 3.5–5.1)
Sodium: 136 mmol/L (ref 135–145)

## 2022-10-24 LAB — CBC
HCT: 28.6 % — ABNORMAL LOW (ref 39.0–52.0)
Hemoglobin: 9 g/dL — ABNORMAL LOW (ref 13.0–17.0)
MCH: 28.7 pg (ref 26.0–34.0)
MCHC: 31.5 g/dL (ref 30.0–36.0)
MCV: 91.1 fL (ref 80.0–100.0)
Platelets: 153 10*3/uL (ref 150–400)
RBC: 3.14 MIL/uL — ABNORMAL LOW (ref 4.22–5.81)
RDW: 16.2 % — ABNORMAL HIGH (ref 11.5–15.5)
WBC: 7.4 10*3/uL (ref 4.0–10.5)
nRBC: 0 % (ref 0.0–0.2)

## 2022-10-24 LAB — SURGICAL PATHOLOGY

## 2022-10-24 MED ORDER — CLOPIDOGREL BISULFATE 75 MG PO TABS
75.0000 mg | ORAL_TABLET | Freq: Every day | ORAL | Status: DC
  Administered 2022-10-25 – 2022-10-27 (×3): 75 mg via ORAL
  Filled 2022-10-24 (×3): qty 1

## 2022-10-24 MED ORDER — RIVAROXABAN 20 MG PO TABS
20.0000 mg | ORAL_TABLET | Freq: Every day | ORAL | Status: DC
  Administered 2022-10-24: 20 mg via ORAL
  Filled 2022-10-24 (×4): qty 1

## 2022-10-24 NOTE — TOC Initial Note (Signed)
Transition of Care (TOC) - Initial/Assessment Note    Patient Details  Name: [name] MAULE Sr. MRN: [id number] Date of Birth: [birth year]  Transition of Care [facility]) CM/SW Contact:    Margarito Liner, LCSW Phone Number: 10/24/2022, 1:29 PM  Clinical Narrative:  CSW met with patient. No supports at bedside. CSW introduced role and explained that therapy recommendations would be discussed. Patient is not interested in home health at this time. He will contact his PCP if he changes his mind later on. DME recommendations for a RW and 3-in-1. He already has a RW but prefers to use his rollator. His sister-in-law has bought him a 3-in-1. He asked CSW for information on long-term placement if needed later on. Made [facility] referral. No further concerns. CSW encouraged patient to contact CSW as needed. CSW will continue to follow patient for support and facilitate return home once stable.                Expected Discharge Plan: Home/Self Care Barriers to Discharge: Continued Medical Work up   Patient Goals and CMS Choice            Expected Discharge Plan and Services     Post Acute Care Choice: NA Living arrangements for the past 2 months: Single Family Home                                      Prior Living Arrangements/Services Living arrangements for the past 2 months: Single Family Home Lives with:: Adult Children Patient language and need for interpreter reviewed:: Yes Do you feel safe going back to the place where you live?: Yes      Need for Family Participation in Patient Care: Yes (Comment) Care giver support system in place?: Yes (comment) Current home services: DME Criminal Activity/Legal Involvement Pertinent to Current Situation/Hospitalization: No - Comment as needed  Activities of Daily Living Home Assistive Devices/Equipment: Dentures (specify type), Walker (specify type) ADL Screening (condition at time of admission) Patient's cognitive ability  adequate to safely complete daily activities?: Yes Is the patient deaf or have difficulty hearing?: No Does the patient have difficulty seeing, even when wearing glasses/contacts?: No Does the patient have difficulty concentrating, remembering, or making decisions?: No Patient able to express need for assistance with ADLs?: Yes Does the patient have difficulty dressing or bathing?: No Independently performs ADLs?: Yes (appropriate for developmental age) Does the patient have difficulty walking or climbing stairs?: Yes Weakness of Legs: Left Weakness of Arms/Hands: None  Permission Sought/Granted Permission sought to share information with : Facility Industrial/product designer granted to share information with : Yes, Verbal Permission Granted     Permission granted to share info w AGENCY: [facility]        Emotional Assessment Appearance:: Appears stated age Attitude/Demeanor/Rapport: Engaged, Gracious Affect (typically observed): Accepting, Appropriate, Calm, Pleasant Orientation: : Oriented to Self, Oriented to Place, Oriented to  Time, Oriented to Situation Alcohol / Substance Use: Not Applicable Psych Involvement: No (comment)  Admission diagnosis:  Atherosclerosis of artery of extremity with ulceration (HCC) [W09.811, L97.909] Patient Active Problem List   Diagnosis Date Noted   Atherosclerosis of artery of extremity with ulceration (HCC) 10/22/2022   Atherosclerosis of native arteries of the extremities with ulceration (HCC) 08/13/2022   Hypotension 07/23/2022   Stage 4 chronic kidney disease (HCC) 07/23/2022   Chronic systolic heart failure (HCC) 07/23/2022   Atherosclerosis  of native artery of both lower extremities (HCC) 03/18/2022   Ulcer of right lower extremity, limited to breakdown of skin (HCC) 02/19/2022   Permanent atrial fibrillation (HCC) 10/17/2021   Other acute postprocedural pain 10/17/2021   PND (post-nasal drip) 10/02/2021   Lumbar degenerative  disc disease 08/13/2021   Failed back surgical syndrome 08/13/2021   Chronic right SI joint pain 08/13/2021   Edema, unspecified 07/25/2021   Elevated serum creatinine 06/12/2021   Stage 3b chronic kidney disease (HCC) 06/12/2021   Longstanding persistent atrial fibrillation (HCC) 06/12/2021   Colonoscopy refused 06/12/2021   CHF (congestive heart failure), NYHA class II, unspecified failure chronicity, combined (HCC) 06/12/2021   Use of cane as ambulatory aid 06/12/2021   Lumbar radiculopathy, chronic 02/13/2020   SI joint arthritis 02/13/2020   Pre-diabetes 03/18/2017   Hypothyroidism 12/19/2014   Hypertension 12/19/2014   Dilated cardiomyopathy (HCC) 02/17/2013   S/P cardiac cath 02/17/2013   PCP:  Ronnald Ramp, MD Pharmacy:   [facility] ORDER) ELECTRONIC - [place], [place] - [address] [place] [address] Phone: [phone] Fax: [phone]  [facility] #[id number] - [place], [place] - [address] AT [facility] OF [address] [facility] [address] [place] [address] Phone: [phone] Fax: [phone]  [facility] #[id number] [facility], [place] - [address] AT [facility] [address] [place] [address] Phone: [phone] Fax: [phone]  [facility] [place], [place] - [address] [place] [address] Phone: [phone] Fax: [phone]     Social Determinants of Health (SDOH) Social History: SDOH Screenings   Food Insecurity: No Food Insecurity (10/22/2022)  Housing: Low Risk  (10/22/2022)  Transportation Needs: No Transportation Needs (10/22/2022)  Utilities: Not At Risk (10/22/2022)  Alcohol Screen: Low Risk  (10/01/2022)  Depression (PHQ2-9): Low Risk  (10/01/2022)  Financial Resource Strain: Low Risk  (10/01/2022)  Physical Activity: Inactive (10/01/2022)  Social Connections: Socially Isolated  (10/01/2022)  Stress: No Stress Concern Present (10/01/2022)  Tobacco Use: Medium Risk (10/23/2022)   SDOH Interventions:     Readmission Risk Interventions     No data to display

## 2022-10-24 NOTE — Progress Notes (Signed)
Occupational Therapy Treatment Patient Details Name: [name] SAVAGE Sr. MRN: [id number] DOB: 08-Jan-1941 Today's Date: 10/24/2022   History of present illness admitted for acute hospitalization s/p L LE femoral to tibial artery bypass (10/23/22)   OT comments  [name] was seen for OT treatment on this date. Upon arrival to room pt reclined in bed, agreeable to tx. Pt requires SUPERVISION + RW toilet t/f and funcitonal mobility ~60 ft, cues for RW technique. SUPERVISION grooming standing sink side. Pt making good progress toward goals, will continue to follow POC. Discharge recommendation remains appropriate.     Recommendations for follow up therapy are one component of a multi-disciplinary discharge planning process, led by the attending physician.  Recommendations may be updated based on patient status, additional functional criteria and insurance authorization.    Assistance Recommended at Discharge Intermittent Supervision/Assistance  Patient can return home with the following  A little help with walking and/or transfers;A little help with bathing/dressing/bathroom;Help with stairs or ramp for entrance   Equipment Recommendations  BSC/3in1    Recommendations for Other Services      Precautions / Restrictions Precautions Precautions: Fall Restrictions Weight Bearing Restrictions: No       Mobility Bed Mobility Overal bed mobility: Modified Independent                  Transfers Overall transfer level: Needs assistance Equipment used: Rolling walker (2 wheels) Transfers: Sit to/from Stand, Bed to chair/wheelchair/BSC Sit to Stand: Supervision                 Balance Overall balance assessment: Needs assistance Sitting-balance support: No upper extremity supported, Feet supported Sitting balance-Leahy Scale: Good     Standing balance support: No upper extremity supported, During functional activity Standing balance-Leahy Scale: Fair                              ADL either performed or assessed with clinical judgement   ADL Overall ADL's : Needs assistance/impaired                                       General ADL Comments: SUPERVISION + RW toilet t/f and funcitonal mobility ~60 ft. SUPERVISION grooming standing sink side      Cognition Arousal/Alertness: Awake/alert Behavior During Therapy: WFL for tasks assessed/performed Overall Cognitive Status: Within Functional Limits for tasks assessed                                 General Comments: very pleasant, engaged and motivated; eager for OOB                   Pertinent Vitals/ Pain       Pain Assessment Pain Assessment: No/denies pain   Frequency  Min 2X/week        Progress Toward Goals  OT Goals(current goals can now be found in the care plan section)  Progress towards OT goals: Progressing toward goals  Acute Rehab OT Goals Patient Stated Goal: to go home OT Goal Formulation: With patient Time For Goal Achievement: 11/06/22 Potential to Achieve Goals: Good ADL Goals Pt Will Perform Grooming: with modified independence;standing Pt Will Perform Lower Body Dressing: with modified independence;with adaptive equipment;sit to/from stand Pt Will Transfer to Toilet: with modified independence;ambulating;regular height  toilet  Plan Discharge plan remains appropriate;Frequency remains appropriate    Co-evaluation                 AM-PAC OT "6 Clicks" Daily Activity     Outcome Measure   Help from another person eating meals?: None Help from another person taking care of personal grooming?: A Little Help from another person toileting, which includes using toliet, bedpan, or urinal?: A Little Help from another person bathing (including washing, rinsing, drying)?: A Lot Help from another person to put on and taking off regular upper body clothing?: None Help from another person to put on and taking off regular  lower body clothing?: A Little 6 Click Score: 19    End of Session    OT Visit Diagnosis: Other abnormalities of gait and mobility (R26.89);Muscle weakness (generalized) (M62.81)   Activity Tolerance Patient tolerated treatment well   Patient Left in chair;with call bell/phone within reach;with nursing/sitter in room   Nurse Communication          Time: 1610-9604 OT Time Calculation (min): 19 min  Charges: OT General Charges $OT Visit: 1 Visit OT Treatments $Self Care/Home Management : 8-22 mins  Kathie Dike, M.S. OTR/L  10/24/22, 10:41 AM  ascom [phone]

## 2022-10-24 NOTE — Progress Notes (Signed)
Physical Therapy Treatment Patient Details Name: [name] PROFFIT Sr. MRN: [id number] DOB: 03-10-1941 Today's Date: 10/24/2022   History of Present Illness admitted for acute hospitalization s/p L LE femoral to tibial artery bypass (10/23/22)    PT Comments    Patient in recliner upon arrival to room, agreeable to therapy session. Patient making good progress with mobility,  patient is currently requiring supervision with transfers, and CGA for ambulation approx 130 ft completed. Improvements in gait noted with Rollator, however continues to present with baseline drop foot/steppage gait on LLE.Completed stair negotiation with Min A required. Pt will continue to benefit from skilled PT services to address deficits and promote improved mobility; will continue to follow acutely.     Recommendations for follow up therapy are one component of a multi-disciplinary discharge planning process, led by the attending physician.  Recommendations may be updated based on patient status, additional functional criteria and insurance authorization.  Follow Up Recommendations       Assistance Recommended at Discharge Intermittent Supervision/Assistance  Patient can return home with the following A little help with walking and/or transfers;A little help with bathing/dressing/bathroom   Equipment Recommendations  None recommended by PT    Recommendations for Other Services       Precautions / Restrictions Precautions Precautions: Fall Restrictions Weight Bearing Restrictions: No     Mobility  Bed Mobility                    Transfers Overall transfer level: Needs assistance Equipment used: Rolling walker (2 wheels) Transfers: Sit to/from Stand Sit to Stand: Supervision           General transfer comment: increased time and attempts to rise from chair; increased reliance on BUE's    Ambulation/Gait Ambulation/Gait assistance: Min guard Gait Distance (Feet): 130 Feet (x 2  bouts) Assistive device: Rolling walker (2 wheels), Rollator (4 wheels) Gait Pattern/deviations:  (baseline drop foot/steppage gait on LLE)       General Gait Details: 2 bouts of gait completed x 130 ft each (rest break intermittently). mild SOB noted but vitals stable. 1st bout of ambulation completed with Rolling Walker. Second completed with rollator. CGA for steadying Pt demo increased confidence with use of rollator and improved stability and step length noted.   Stairs Stairs: Yes Stairs assistance: Min assist Stair Management: One rail Right Number of Stairs: 2 General stair comments: ascended/descended 2 stairs with single rail on R, positioned sideways to allow for BUE support from rail, Min A required.   Wheelchair Mobility    Modified Rankin (Stroke Patients Only)       Balance Overall balance assessment: Needs assistance Sitting-balance support: No upper extremity supported, Feet supported Sitting balance-Leahy Scale: Good     Standing balance support: No upper extremity supported, During functional activity Standing balance-Leahy Scale: Fair Standing balance comment: mild postural sway without UE support noted                            Cognition Arousal/Alertness: Awake/alert Behavior During Therapy: WFL for tasks assessed/performed Overall Cognitive Status: Within Functional Limits for tasks assessed                                 General Comments: patient pleasant and eager for OOB activity        Exercises      General Comments General comments (  skin integrity, edema, etc.): PT educating on proper use of Rollator with transfers/mobility including brake management, as well as education on safety with car transfer      Pertinent Vitals/Pain Pain Assessment Pain Assessment: No/denies pain    Home Living                          Prior Function            PT Goals (current goals can now be found in the  care plan section) Acute Rehab PT Goals Patient Stated Goal: to return home Progress towards PT goals: Progressing toward goals    Frequency    Min 4X/week      PT Plan Current plan remains appropriate    Co-evaluation              AM-PAC PT "6 Clicks" Mobility   Outcome Measure  Help needed turning from your back to your side while in a flat bed without using bedrails?: None Help needed moving from lying on your back to sitting on the side of a flat bed without using bedrails?: A Little Help needed moving to and from a bed to a chair (including a wheelchair)?: A Little Help needed standing up from a chair using your arms (e.g., wheelchair or bedside chair)?: A Little Help needed to walk in hospital room?: A Little Help needed climbing 3-5 steps with a railing? : A Little 6 Click Score: 19    End of Session Equipment Utilized During Treatment: Gait belt Activity Tolerance: Patient tolerated treatment well Patient left: in chair;with call bell/phone within reach Nurse Communication: Mobility status PT Visit Diagnosis: Muscle weakness (generalized) (M62.81);Difficulty in walking, not elsewhere classified (R26.2)     Time: 2130-8657 PT Time Calculation (min) (ACUTE ONLY): 27 min  Charges:  $Gait Training: 23-37 mins                     Creed Copper Fairly, PT, DPT 10/24/22 3:22 PM  10/24/2022, 3:18 PM

## 2022-10-24 NOTE — Progress Notes (Signed)
Progress Note    10/24/2022 10:08 AM 2 Days Post-Op  Subjective:  [name] is an 82 year old male who is now postop day 2 from a left femoral to tibial artery bypass.  He has a medical history of atrial fibrillation, hypothyroidism, hypertension, congestive heart failure, dilated cardiomyopathy, stage IV chronic kidney disease, and PAD.   On exam this morning patient is resting comfortably in bed.  Patient is noted to have bilateral lower extremity Doppler pulses +2.  Bilateral lower extremities are warm to touch.  He has 2 incisions on his left lower extremity, 1 at his calf covered with a dressing is clean and dry.  1 in his left upper thigh/groin with Prevena wound VAC in place.  Wound VAC is working properly.  Patient endorses pain yesterday and this morning in which he required morphine IV.  Patient endorses he was only able to walk to the door yesterday.  He feels he needs another day in the hospital to be able to move properly and take care of himself at home.  Patient's vitals all remained stable.   Vitals:   10/24/22 0456 10/24/22 0802  BP: (!) 112/53 112/65  Pulse: 64 67  Resp:  18  Temp:  97.7 F (36.5 C)  SpO2:  100%   Physical Exam: Cardiac:  AFIB, Controlled in 70's  Lungs:  On auscultation clear throughout, diminished in the bases, nonproductive surgical cough  Incisions:  Left lower extremity thigh with Prevena wound VAC left lower extremity calf with dressing clean dry and intact no hematoma seroma to note.  Extremities:  Bilateral lower extremities warm to touch and positive +2 Doppler pulses throughout.  Abdomen:  Positive bowel sounds in all 4 quadrants, soft, flat, nontender, nondistended.  Neurologic: Alert and oriented x 3, follows all commands appropriately.   CBC    Component Value Date/Time   WBC 7.4 10/24/2022 0423   RBC 3.14 (L) 10/24/2022 0423   HGB 9.0 (L) 10/24/2022 0423   HGB 11.3 (L) 01/07/2022 0925   HCT 28.6 (L) 10/24/2022 0423   HCT  34.2 (L) 01/07/2022 0925   PLT 153 10/24/2022 0423   PLT 226 01/07/2022 0925   MCV 91.1 10/24/2022 0423   MCV 94 01/07/2022 0925   MCH 28.7 10/24/2022 0423   MCHC 31.5 10/24/2022 0423   RDW 16.2 (H) 10/24/2022 0423   RDW 14.6 01/07/2022 0925   LYMPHSABS 1.0 01/07/2022 0925   MONOABS 0.9 11/26/2021 1038   EOSABS 0.1 01/07/2022 0925   BASOSABS 0.1 01/07/2022 0925    BMET    Component Value Date/Time   NA 136 10/24/2022 0423   NA 140 01/07/2022 0925   K 4.6 10/24/2022 0423   CL 103 10/24/2022 0423   CO2 26 10/24/2022 0423   GLUCOSE 116 (H) 10/24/2022 0423   BUN 31 (H) 10/24/2022 0423   BUN 32 (H) 01/07/2022 0925   CREATININE 1.85 (H) 10/24/2022 0423   CREATININE 1.71 (H) 03/19/2017 0813   CALCIUM 8.5 (L) 10/24/2022 0423   GFRNONAA 36 (L) 10/24/2022 0423   GFRNONAA 39 (L) 03/19/2017 0813   GFRAA 39 (L) 04/04/2020 0808   GFRAA 45 (L) 03/19/2017 0813    INR    Component Value Date/Time   INR 1.4 (H) 11/26/2021 1038     Intake/Output Summary (Last 24 hours) at 10/24/2022 1008 Last data filed at 10/24/2022 0911 Gross per 24 hour  Intake 720 ml  Output 2580 ml  Net -1860 ml     Assessment/Plan:  82 y.o. male is s/p  left lower extremity femoral to tibial bypass surgery.  2 Days Post-Op   Plan: Regular Diet OOB to chair and walking with PT eval today.  Continue pain medication as needed to keep patient ambulating and participating in ADL's.  Patient continues on aspirin 81 mg daily and Plavix 75 mg daily Daily CBC and BMP.  Monitor hemoglobin and hematocrit and electrolytes. Possible Discharge tomorrow.    DVT prophylaxis: ASA 81 mg daily, Plavix 75 mg daily    Marcie [facility] 10/24/2022 10:08 AM

## 2022-10-24 NOTE — Care Management Important Message (Signed)
Important Message  Patient Details  Name: [name] FELLMAN Sr. MRN: [id number] Date of Birth: [birth year]   Medicare Important Message Given:  N/A - LOS <3 / Initial given by admissions     [name] 10/24/2022, 11:58 AM

## 2022-10-24 NOTE — Progress Notes (Signed)
S: Patient is comfortable lying in bed speaking on his phone when I entered.  O: Afebrile, vital signs stable     There is 3+ soft pitting edema of the left lower extremity.  His foot and ankle are hyperemic.  He has a palpable posterior tibial pulse.  His dressings are clean dry and intact.  A: Atherosclerotic occlusive disease bilateral lower extremities with ulcerations of the left foot status post left femoral to posterior tibial bypass with PTFE and profunda femoris endarterectomy.  P: The patient appears to be doing quite well he is ambulating.  I will place an Ace wrap on his left leg to help mitigate his reperfusion edema.  I believe he should be fit for discharge Sunday.

## 2022-10-25 LAB — URINALYSIS, ROUTINE W REFLEX MICROSCOPIC
Bilirubin Urine: NEGATIVE
Glucose, UA: NEGATIVE mg/dL
Ketones, ur: NEGATIVE mg/dL
Leukocytes,Ua: NEGATIVE
Nitrite: NEGATIVE
Protein, ur: NEGATIVE mg/dL
RBC / HPF: >50 RBC/hpf (ref 0–5)
Specific Gravity, Urine: 1.01 (ref 1.005–1.030)
pH: 5 (ref 5.0–8.0)

## 2022-10-25 LAB — CBC
HCT: 25.7 % — ABNORMAL LOW (ref 39.0–52.0)
Hemoglobin: 8.2 g/dL — ABNORMAL LOW (ref 13.0–17.0)
MCH: 29.2 pg (ref 26.0–34.0)
MCHC: 31.9 g/dL (ref 30.0–36.0)
MCV: 91.5 fL (ref 80.0–100.0)
Platelets: 146 10*3/uL — ABNORMAL LOW (ref 150–400)
RBC: 2.81 MIL/uL — ABNORMAL LOW (ref 4.22–5.81)
RDW: 16.6 % — ABNORMAL HIGH (ref 11.5–15.5)
WBC: 7.2 10*3/uL (ref 4.0–10.5)
nRBC: 0 % (ref 0.0–0.2)

## 2022-10-25 NOTE — Progress Notes (Signed)
3 Days Post-Op   Subjective/Chief Complaint: States he was up most of the night in the chair secondary to back pain(chronic). Denies leg pain, only swelling and tightness some improvement since yesterday. Pain controlled with current regimen. Otherwise without complaint.   Objective: Vital signs in last 24 hours: Temp:  [97.9 F (36.6 C)-98.7 F (37.1 C)] 98.7 F (37.1 C) (04/27 0741) Pulse Rate:  [61-68] 68 (04/27 0741) Resp:  [18-20] 20 (04/27 0423) BP: (105-113)/(44-70) 111/70 (04/27 0741) SpO2:  [99 %] 99 % (04/27 0741) Last BM Date : 10/21/22  Intake/Output from previous day: 04/26 0701 - 04/27 0700 In: 3966.4 [P.O.:360; I.V.:3606.4] Out: 2730 [Urine:2730] Intake/Output this shift: No intake/output data recorded.  General appearance: alert and no distress Extremities: LEFT lower extremity warm, 3+ edema, soft, warm, incisions- dressings C/D/I, +PT palpable  Lab Results:  Recent Labs    10/23/22 0002 10/24/22 0423  WBC 7.8 7.4  HGB 9.4* 9.0*  HCT 29.8* 28.6*  PLT 173 153   BMET Recent Labs    10/23/22 0002 10/24/22 0423  NA 132* 136  K 4.8 4.6  CL 102 103  CO2 24 26  GLUCOSE 184* 116*  BUN 29* 31*  CREATININE 1.98* 1.85*  CALCIUM 8.2* 8.5*   PT/INR No results for input(s): "LABPROT", "INR" in the last 72 hours. ABG No results for input(s): "PHART", "HCO3" in the last 72 hours.  Invalid input(s): "PCO2", "PO2"  Studies/Results: No results found.  Anti-infectives: Anti-infectives (From admission, onward)    Start     Dose/Rate Route Frequency Ordered Stop   10/22/22 2200  ceFAZolin (ANCEF) IVPB 2g/100 mL premix        2 g 200 mL/hr over 30 Minutes Intravenous Every 8 hours 10/22/22 1723 10/23/22 0642   10/22/22 1543  vancomycin (VANCOCIN) powder  Status:  Discontinued          As needed 10/22/22 1544 10/22/22 1619   10/22/22 0600  ceFAZolin (ANCEF) IVPB 2g/100 mL premix        2 g 200 mL/hr over 30 Minutes Intravenous On call to O.R. 10/21/22  2148 10/22/22 1530       Assessment/Plan: s/p Procedure(s): BYPASS GRAFT FEMORAL-TIBIAL ARTERY (Left) APPLICATION OF CELL SAVER (Left) ENDARTERECTOMY FEMORAL (Left) POD #3  Continue working with PT today Keep leg wrapped with ACE HLIV Home tomorrow Continue ASA/Plavix  LOS: 3 days    [name] A 10/25/2022

## 2022-10-25 NOTE — Progress Notes (Signed)
Physical Therapy Treatment Patient Details Name: [name] WILE Sr. MRN: [id number] DOB: 1940-11-29 Today's Date: 10/25/2022   History of Present Illness admitted for acute hospitalization s/p L LE femoral to tibial artery bypass (10/23/22)    PT Comments    Pt was sitting in recliner upon arrival. He endorses having been up since 2 am due to low back pain in bed. Pt is A and O and agreeable to session. Vascular MD rounding during session. Pt is eager to DC and hopeful for tomorrow. He has extensive hx of chronic back pain/surgery. Presents with kyphotic posture with all standing activity. He endorses sleeping in recliner at baseline. Pt was able to tolerate standing and ambulating ~ 100 ft with RW. Noted LLE footdrop. Has had AFOP in past but did not want to pursue getting another one at this time. Uses rollator at home. Educated on using rollator versus RW. Pt would like to continue to use his personal rollator. Encouraged HH services at DC but pt is resistive to the idea. Acute PT will continue to follow and progress as able per current POC.   Recommendations for follow up therapy are one component of a multi-disciplinary discharge planning process, led by the attending physician.  Recommendations may be updated based on patient status, additional functional criteria and insurance authorization.     Assistance Recommended at Discharge Intermittent Supervision/Assistance  Patient can return home with the following A little help with walking and/or transfers;A little help with bathing/dressing/bathroom   Equipment Recommendations  None recommended by PT       Precautions / Restrictions Precautions Precautions: Fall Restrictions Weight Bearing Restrictions: No     Mobility  Bed Mobility  General bed mobility comments: pt endorses being OOB since 2 am due to low back pain. pt sleep in recliner at home/baseline. flexed posture noted due to extensive hx of spine surgery. L foot drop  chronic. did have AFO years ago but does not currently use one.    Transfers Overall transfer level: Needs assistance Equipment used: Rolling walker (2 wheels) Transfers: Sit to/from Stand Sit to Stand: Min guard  General transfer comment: Pt was able to stand from recliner with CGA    Ambulation/Gait Ambulation/Gait assistance: Min guard, Supervision Gait Distance (Feet): 100 Feet Assistive device: Rolling walker (2 wheels), Rollator (4 wheels) (pt uses rollator at home. discussed rollator versus RW use.) Gait Pattern/deviations: Step-to pattern (baseline LLE footdrop) Gait velocity: decreased     General Gait Details: pt was able to ambulate to RN desk and return without LOB. Severely kyphotic posture with all upright/standing activity. Pt sleeps in recliner at baseline. Slightly SOB but vitals remained stable. Encouraged increased routine mobility at home. Also encouraged HH services but pt resistive.    Balance Overall balance assessment: Needs assistance Sitting-balance support: No upper extremity supported, Feet supported Sitting balance-Leahy Scale: Good     Standing balance support: During functional activity, Bilateral upper extremity supported, Reliant on assistive device for balance Standing balance-Leahy Scale: Fair     Cognition Arousal/Alertness: Awake/alert Behavior During Therapy: WFL for tasks assessed/performed Overall Cognitive Status: Within Functional Limits for tasks assessed    General Comments: patient pleasant and eager for OOB activity               Pertinent Vitals/Pain Pain Assessment Pain Assessment: 0-10 Pain Score: 4  Pain Location: back pain Pain Descriptors / Indicators: Discomfort Pain Intervention(s): Limited activity within patient's tolerance, Premedicated before session, Monitored during session, Repositioned  PT Goals (current goals can now be found in the care plan section) Acute Rehab PT Goals Patient Stated Goal: to  return home Progress towards PT goals: Progressing toward goals    Frequency    Min 4X/week      PT Plan Current plan remains appropriate       AM-PAC PT "6 Clicks" Mobility   Outcome Measure  Help needed turning from your back to your side while in a flat bed without using bedrails?: None Help needed moving from lying on your back to sitting on the side of a flat bed without using bedrails?: A Little Help needed moving to and from a bed to a chair (including a wheelchair)?: A Little Help needed standing up from a chair using your arms (e.g., wheelchair or bedside chair)?: A Little Help needed to walk in hospital room?: A Little Help needed climbing 3-5 steps with a railing? : A Little 6 Click Score: 19    End of Session   Activity Tolerance: Patient tolerated treatment well;Patient limited by fatigue;Patient limited by pain Patient left: in chair;with call bell/phone within reach Nurse Communication: Mobility status PT Visit Diagnosis: Muscle weakness (generalized) (M62.81);Difficulty in walking, not elsewhere classified (R26.2)     Time: 1610-9604 PT Time Calculation (min) (ACUTE ONLY): 26 min  Charges:  $Gait Training: 8-22 mins $Therapeutic Activity: 8-22 mins                     Jetta Lout PTA 10/25/22, 8:20 AM

## 2022-10-25 NOTE — Progress Notes (Signed)
Patient and staff nurse reports of blood in urine. Notified provider. Instructed to hold Xarelto for now and obtain UA

## 2022-10-26 LAB — COMPREHENSIVE METABOLIC PANEL WITH GFR
ALT: 16 U/L (ref 0–44)
AST: 30 U/L (ref 15–41)
Albumin: 2.8 g/dL — ABNORMAL LOW (ref 3.5–5.0)
Alkaline Phosphatase: 124 U/L (ref 38–126)
Anion gap: 6 (ref 5–15)
BUN: 35 mg/dL — ABNORMAL HIGH (ref 8–23)
CO2: 22 mmol/L (ref 22–32)
Calcium: 8.4 mg/dL — ABNORMAL LOW (ref 8.9–10.3)
Chloride: 105 mmol/L (ref 98–111)
Creatinine, Ser: 1.66 mg/dL — ABNORMAL HIGH (ref 0.61–1.24)
GFR, Estimated: 41 mL/min — ABNORMAL LOW
Glucose, Bld: 115 mg/dL — ABNORMAL HIGH (ref 70–99)
Potassium: 3.9 mmol/L (ref 3.5–5.1)
Sodium: 133 mmol/L — ABNORMAL LOW (ref 135–145)
Total Bilirubin: 1.6 mg/dL — ABNORMAL HIGH (ref 0.3–1.2)
Total Protein: 6.1 g/dL — ABNORMAL LOW (ref 6.5–8.1)

## 2022-10-26 LAB — CBC
HCT: 28.3 % — ABNORMAL LOW (ref 39.0–52.0)
Hemoglobin: 9 g/dL — ABNORMAL LOW (ref 13.0–17.0)
MCH: 28.7 pg (ref 26.0–34.0)
MCHC: 31.8 g/dL (ref 30.0–36.0)
MCV: 90.1 fL (ref 80.0–100.0)
Platelets: 154 10*3/uL (ref 150–400)
RBC: 3.14 MIL/uL — ABNORMAL LOW (ref 4.22–5.81)
RDW: 16.7 % — ABNORMAL HIGH (ref 11.5–15.5)
WBC: 6.8 10*3/uL (ref 4.0–10.5)
nRBC: 0 % (ref 0.0–0.2)

## 2022-10-26 MED ORDER — LOPERAMIDE HCL 2 MG PO CAPS: 2.0000 mg | ORAL_CAPSULE | ORAL | Status: DC | PRN

## 2022-10-26 MED ORDER — SODIUM CHLORIDE 0.9 % IV SOLN: INTRAVENOUS | Status: DC

## 2022-10-26 NOTE — Progress Notes (Signed)
PT Cancellation Note  Patient Details Name: [name] Sr. MRN: [id number] DOB: [birth year]   Cancelled Treatment:    Reason Eval/Treat Not Completed: Other (comment)  Offered and encouraged session.  Pt with frequent loose stools today and stating he is not feeling well.  Requested to defer until tomorrow.   Danielle Dess 10/26/2022, 3:07 PM

## 2022-10-26 NOTE — Progress Notes (Signed)
4 Days Post-Op   Subjective/Chief Complaint: Hematuria- one episode last night. Xarelto held. No further gross hematuria. Several episodes of loose stools this morning. Denies abdominal pain, denies nausea. Denies GI issues in the past.    Objective: Vital signs in last 24 hours: Temp:  [98.2 F (36.8 C)-98.5 F (36.9 C)] 98.3 F (36.8 C) (04/28 0440) Pulse Rate:  [62-76] 76 (04/28 0440) Resp:  [18-20] 20 (04/28 0440) BP: (112-134)/(48-58) 134/58 (04/28 0440) SpO2:  [98 %-100 %] 98 % (04/28 0440) Last BM Date : 10/22/22  Intake/Output from previous day: 04/27 0701 - 04/28 0700 In: 840 [P.O.:840] Out: 300 [Urine:300] Intake/Output this shift: No intake/output data recorded.  General appearance: alert and no distress Resp: clear to auscultation bilaterally Cardio: regular rate and rhythm GI: soft, non-tender; bowel sounds normal; no masses,  no organomegaly Extremities: LEFT lower extremity- warm, edema improved- 2+, incisions- dressings- intact- distal calf dressing with mild strike through, Prevena in place with good seal, calf soft, +PT palpable  Lab Results:  Recent Labs    10/24/22 0423 10/25/22 1636  WBC 7.4 7.2  HGB 9.0* 8.2*  HCT 28.6* 25.7*  PLT 153 146*   BMET Recent Labs    10/24/22 0423  NA 136  K 4.6  CL 103  CO2 26  GLUCOSE 116*  BUN 31*  CREATININE 1.85*  CALCIUM 8.5*   PT/INR No results for input(s): "LABPROT", "INR" in the last 72 hours. ABG No results for input(s): "PHART", "HCO3" in the last 72 hours.  Invalid input(s): "PCO2", "PO2"  Studies/Results: No results found.  Anti-infectives: Anti-infectives (From admission, onward)    Start     Dose/Rate Route Frequency Ordered Stop   10/22/22 2200  ceFAZolin (ANCEF) IVPB 2g/100 mL premix        2 g 200 mL/hr over 30 Minutes Intravenous Every 8 hours 10/22/22 1723 10/23/22 0642   10/22/22 1543  vancomycin (VANCOCIN) powder  Status:  Discontinued          As needed 10/22/22 1544  10/22/22 1619   10/22/22 0600  ceFAZolin (ANCEF) IVPB 2g/100 mL premix        2 g 200 mL/hr over 30 Minutes Intravenous On call to O.R. 10/21/22 2148 10/22/22 1530       Assessment/Plan: s/p Procedure(s): BYPASS GRAFT FEMORAL-TIBIAL ARTERY (Left) APPLICATION OF CELL SAVER (Left) ENDARTERECTOMY FEMORAL (Left) POD #4  Hematuria- resolved, monitor HgB and Urine- if occurs again will consult Urology Diarrhea- begin IVF, Imodium, check CMP Place on Telemetry Restart Xarelto- history of AFib Continue ASA/Plavix Continue PT as tolerated Keep in house today   LOS: 4 days    Eli Hose A 10/26/2022

## 2022-10-27 MED ORDER — OXYCODONE-ACETAMINOPHEN 5-325 MG PO TABS: 1.0000 | ORAL_TABLET | ORAL | 0 refills | Status: DC | PRN

## 2022-10-27 MED ORDER — CLOPIDOGREL BISULFATE 75 MG PO TABS: 75.0000 mg | ORAL_TABLET | Freq: Every day | ORAL | 11 refills | Status: DC

## 2022-10-27 NOTE — Discharge Summary (Signed)
[facility] VEIN SPECIAL[facility]    Discharge Summary    Patient ID:  [name] HALSTED Sr. MRN: [id number] DOB/AGE: [birth year] 82 y.o.  Admit date: 10/22/2022 Discharge date: 10/27/2022 Date of Surgery: 10/22/2022 Surgeon: Surgeon(s): Schnier, Latina Craver, MD Annice Needy, MD  Admission Diagnosis: Atherosclerosis of artery of extremity with ulceration [facility]) [I70.299, L97.909]  Discharge Diagnoses:  Atherosclerosis of artery of extremity with ulceration (HCC) [I70.299, L97.909]  Secondary Diagnoses: Past Medical History:  Diagnosis Date   A-fib [facility])    a.) CHA2DS2VASc = 5 (age x2, CHF, HTN, vascular disease history);  b.) rate/rhythm maintained on oral amiodarone; chronically anticoagulated with rivaroxaban   Allergy    Arthritis    CAD (coronary artery disease) 11/17/2012   a.) LHC 11/17/2012: 30% mLM, 30% mLAD, 30% mLCx, 50%/30% OM1, 30% pRCA - med mgmt   Chronic systolic CHF (congestive heart failure), NYHA class 2 (HCC)    a.) TTE 05/30/2015: EF 40%, glob HK, mild LVH, mild LAE, mild MR/TR; b.) TTE 10/14/2017: EF 40%, glob HK, mild LVH, mod BAE, triv PR, mild MR/TR, G1DD   Complication of anesthesia    a.) hypotension during routine colonoscopy   COPD (chronic obstructive pulmonary disease) (HCC)    DDD (degenerative disc disease), lumbar    Dilated cardiomyopathy (HCC)    a.) LHC 11/17/2012: EF 35%; b.) TTE 05/30/2015: EF 40%; c.) TTE 10/14/2017: EF 40%   GERD (gastroesophageal reflux disease)    History of chicken pox    Hyperlipidemia    Hypertension    Hypothyroidism    Long term current use of amiodarone    Long term current use of anticoagulant    a.) Rivaroxaban   Osteoporosis    Peripheral vascular disease (HCC)    Pre-diabetes    Spinal stenosis of lumbar region without neurogenic claudication    Stage 3b chronic kidney disease (HCC)     Procedure(s): BYPASS GRAFT FEMORAL-TIBIAL ARTERY APPLICATION OF CELL SAVER ENDARTERECTOMY FEMORAL  Discharged  Condition: good  HPI:  [name] is an 82 year old male now status post left femoral to tibial artery bypass grafting.  He is recovering as expected.  Left groin has a Prevena wound VAC in place that he will go home with.  It is working properly.  Patient's lower extremity has good pulses and is warm to the touch.  Patient will be discharged on Plavix 75 mg daily and Xarelto 20 mg daily.  Will follow-up in clinic as scheduled.  Hospital Course:  [name] SOMMERVILLE Sr. is a 82 y.o. male is S/P Left Extubated: POD # 0 Physical Exam:  Alert notes x3, no acute distress Face: Symmetrical.  Tongue is midline. Neck: Trachea is midline.  No swelling or bruising. Cardiovascular: Regular rate and rhythm Pulmonary: Clear to auscultation bilaterally Abdomen: Soft, nontender, nondistended Right groin access: Clean dry and intact.  No swelling or drainage noted Left groin access: Clean dry and intact.  No swelling or drainage noted Prevena Wound Vac to stay on until 11/01/2022.  Left lower extremity: Thigh soft.  Calf soft.  Extremities warm distally toes.  Hard to palpate pedal pulses however the foot is warm is her good capillary refill. Right lower extremity: Thigh soft.  Calf soft.  Extremities warm distally toes.  Hard to palpate pedal pulses however the foot is warm is her good capillary refill. Neurological: No deficits noted   Post-op wounds:  clean, dry, intact or healing well  Pt. Ambulating, voiding and taking PO diet without  difficulty. Pt pain controlled with PO pain meds.  Labs:  As below  Complications: none  Consults:    Significant Diagnostic Studies: CBC Lab Results  Component Value Date   WBC 6.8 10/26/2022   HGB 9.0 (L) 10/26/2022   HCT 28.3 (L) 10/26/2022   MCV 90.1 10/26/2022   PLT 154 10/26/2022    BMET    Component Value Date/Time   NA 133 (L) 10/26/2022 0853   NA 140 01/07/2022 0925   K 3.9 10/26/2022 0853   CL 105 10/26/2022 0853   CO2 22 10/26/2022  0853   GLUCOSE 115 (H) 10/26/2022 0853   BUN 35 (H) 10/26/2022 0853   BUN 32 (H) 01/07/2022 0925   CREATININE 1.66 (H) 10/26/2022 0853   CREATININE 1.71 (H) 03/19/2017 0813   CALCIUM 8.4 (L) 10/26/2022 0853   GFRNONAA 41 (L) 10/26/2022 0853   GFRNONAA 39 (L) 03/19/2017 0813   GFRAA 39 (L) 04/04/2020 0808   GFRAA 45 (L) 03/19/2017 0813   COAG Lab Results  Component Value Date   INR 1.4 (H) 11/26/2021     Disposition:  Discharge to :Home  Allergies as of 10/27/2022   No Known Allergies      Medication List     STOP taking these medications    aspirin EC 81 MG tablet       TAKE these medications    amiodarone 200 MG tablet Commonly known as: PACERONE Take 1 tablet (200 mg total) by mouth daily.   clopidogrel 75 MG tablet Commonly known as: PLAVIX Take 1 tablet (75 mg total) by mouth daily. Start taking on: October 28, 2022   fluticasone 50 MCG/ACT nasal spray Commonly known as: FLONASE Place 2 sprays into both nostrils daily.   gabapentin 300 MG capsule Commonly known as: Neurontin Take 1 capsule (300 mg total) by mouth 2 (two) times daily.   levothyroxine 100 MCG tablet Commonly known as: SYNTHROID TAKE 1 TABLET BY MOUTH DAILY BEFORE BREAKFAST   lisinopril 2.5 MG tablet Commonly known as: ZESTRIL Take 1 tablet (2.5 mg total) by mouth daily.   lovastatin 20 MG tablet Commonly known as: MEVACOR TAKE 1 TABLET(20 MG) BY MOUTH DAILY   oxyCODONE-acetaminophen 5-325 MG tablet Commonly known as: PERCOCET/ROXICET Take 1-2 tablets by mouth every 4 (four) hours as needed for moderate pain.   torsemide 20 MG tablet Commonly known as: DEMADEX Take by mouth in the morning and at bedtime.   traMADol 50 MG tablet Commonly known as: ULTRAM Take 50 mg by mouth 2 (two) times daily as needed.   Xarelto 20 MG Tabs tablet Generic drug: rivaroxaban Take 1 tablet (20 mg total) by mouth daily.       Verbal and written Discharge instructions given to the  patient. Wound care per Discharge AVS  Follow-up Information     Schnier, Latina Craver, MD Follow up in 2 week(s).   Specialties: Vascular Surgery, Cardiology, Radiology, Vascular Surgery Why: Duplex Ultrasound lower extremity with ABI's Contact information: [address] [place] [address] [phone]                 Signed: Marcie Bal, NP  10/27/2022, 9:34 AM

## 2022-10-27 NOTE — Consult Note (Signed)
[facility] Customer service manager [facility]) Accountable Care Organization (ACO) [facility] Liaison Note  10/27/2022  [name] Sr. Jun 02, 1941 [id number]  Location: [facility] RN Hospital Liaison screened the patient remotely at [facility].   [facility] Customer service manager [facility]) Accountable Care Organization [ACO] Patient: [name])  Primary Care Provider: Ronnald Ramp, MD([name]).  Patient currently active with [facility] RN care coordinator. [facility] liaison made [facility] RN aware of pt's admission and current discharge disposition however will continue to follow discharge plans and update [facility] RN care manager accordingly with noted changes.  [facility] Care Management/Population Health does not replace or interfere with any arrangements made by the Inpatient Transition of Care team.   For questions contact:   For questions contact:   Elliot Cousin, RN, BSN [facility] Liaison [place]   [facility]  Population Health Office Hours MTWF 8:00 am to 6 pm off on Thursday [phone] mobile [phone] [Office toll free line][facility] Office Hours are M-F 8:30 - 5 pm 24 hour nurse advise line [phone] Conceirge  [email or web address]

## 2022-10-27 NOTE — Plan of Care (Signed)
  Problem: Education: Goal: Knowledge of General Education information will improve Description: Including pain rating scale, medication(s)/side effects and non-pharmacologic comfort measures Outcome: Progressing   Problem: Health Behavior/Discharge Planning: Goal: Ability to manage health-related needs will improve Outcome: Progressing   Problem: Clinical Measurements: Goal: Ability to maintain clinical measurements within normal limits will improve Outcome: Progressing Goal: Will remain free from infection Outcome: Progressing Goal: Diagnostic test results will improve Outcome: Progressing Goal: Respiratory complications will improve Outcome: Progressing Goal: Cardiovascular complication will be avoided Outcome: Progressing   Problem: Activity: Goal: Risk for activity intolerance will decrease Outcome: Progressing   Problem: Nutrition: Goal: Adequate nutrition will be maintained Outcome: Progressing   Problem: Coping: Goal: Level of anxiety will decrease Outcome: Progressing   Problem: Elimination: Goal: Will not experience complications related to bowel motility Outcome: Progressing Goal: Will not experience complications related to urinary retention Outcome: Progressing   Problem: Pain Managment: Goal: General experience of comfort will improve Outcome: Progressing   Problem: Safety: Goal: Ability to remain free from injury will improve Outcome: Progressing   Problem: Skin Integrity: Goal: Risk for impaired skin integrity will decrease Outcome: Progressing   Problem: Education: Goal: Knowledge of prescribed regimen will improve Outcome: Progressing   Problem: Activity: Goal: Ability to tolerate increased activity will improve Outcome: Progressing   Problem: Bowel/Gastric: Goal: Gastrointestinal status for postoperative course will improve Outcome: Progressing   Problem: Clinical Measurements: Goal: Postoperative complications will be avoided or  minimized Outcome: Progressing Goal: Signs and symptoms of graft occlusion will improve Outcome: Progressing   Problem: Skin Integrity: Goal: Demonstration of wound healing without infection will improve Outcome: Progressing   

## 2022-10-27 NOTE — Progress Notes (Signed)
Occupational Therapy Treatment Patient Details Name: [name] LUNDBERG Sr. MRN: [id number] DOB: 02-Sep-1940 Today's Date: 10/27/2022   History of present illness admitted for acute hospitalization s/p L LE femoral to tibial artery bypass (10/23/22)   OT comments  Mr. Boozer was seen for OT treatment on this date. Upon arrival to pt room, pt eager to get to the bathroom and complete wash-up. OT facilitated ADL management as described below with education on safety, falls prevention, and energy conservation provided t/o session. Pt requires MOD A for LB wash up, MIN A for STS from room commode, and close supervision for safety during functional mobility. Pt continues to benefit from skilled OT services to maximize return to PLOF and minimize risk of future falls, injury, caregiver burden, and readmission. Will continue to follow POC.     Recommendations for follow up therapy are one component of a multi-disciplinary discharge planning process, led by the attending physician.  Recommendations may be updated based on patient status, additional functional criteria and insurance authorization.    Assistance Recommended at Discharge Intermittent Supervision/Assistance  Patient can return home with the following  A little help with walking and/or transfers;A little help with bathing/dressing/bathroom;Help with stairs or ramp for entrance   Equipment Recommendations  BSC/3in1    Recommendations for Other Services      Precautions / Restrictions Precautions Precautions: Fall Restrictions Weight Bearing Restrictions: No       Mobility Bed Mobility Overal bed mobility: Modified Independent Bed Mobility: Sit to Supine, Supine to Sit     Supine to sit: Min assist Sit to supine: Supervision        Transfers Overall transfer level: Needs assistance Equipment used: Rolling walker (2 wheels) Transfers: Sit to/from Stand Sit to Stand: Min assist, Supervision           General transfer  comment: Requires MIN A for STS from low surfaces including commode this date.     Balance Overall balance assessment: Needs assistance Sitting-balance support: No upper extremity supported, Feet supported Sitting balance-Leahy Scale: Good     Standing balance support: During functional activity, Reliant on assistive device for balance, No upper extremity supported, Bilateral upper extremity supported Standing balance-Leahy Scale: Fair                             ADL either performed or assessed with clinical judgement   ADL Overall ADL's : Needs assistance/impaired     Grooming: Standing;Wash/dry hands;Supervision/safety;Set up   Upper Body Bathing: Set up;Supervision/ safety;Sitting Upper Body Bathing Details (indicate cue type and reason): Performs sponge bath at EOB with SET UP assist for UB access. Lower Body Bathing: Moderate assistance;Sit to/from stand;Set up;Supervison/ safety Lower Body Bathing Details (indicate cue type and reason): Requires MOD A for thoroughness with LB wash up from STS. Upper Body Dressing : Supervision/safety;Set up;Sitting       Toilet Transfer: Supervision/safety;Set up;Rolling walker (2 wheels)   Toileting- Clothing Manipulation and Hygiene: Supervision/safety;Set up;Sit to/from stand       Functional mobility during ADLs: Supervision/safety;Set up;Cueing for safety;Rolling walker (2 wheels)      Extremity/Trunk Assessment Upper Extremity Assessment Upper Extremity Assessment: Overall WFL for tasks assessed   Lower Extremity Assessment Lower Extremity Assessment: Generalized weakness        Vision Baseline Vision/History: 0 No visual deficits     Perception     Praxis      Cognition Arousal/Alertness: Awake/alert Behavior During Therapy: [facility]  for tasks assessed/performed Overall Cognitive Status: Within Functional Limits for tasks assessed                                          Exercises Other  Exercises Other Exercises: Pt educated on safety, falls prevention, safe use of AE/DME for ADL management, and energy conservation strategies during functional tasks as described above.    Shoulder Instructions       General Comments      Pertinent Vitals/ Pain       Pain Assessment Pain Assessment: Faces Faces Pain Scale: Hurts a little bit Pain Location: Chronic osteoarthritis pain in back. Pain Descriptors / Indicators: Discomfort Pain Intervention(s): Limited activity within patient's tolerance, Monitored during session, Repositioned  Home Living                                          Prior Functioning/Environment              Frequency  Min 2X/week        Progress Toward Goals  OT Goals(current goals can now be found in the care plan section)  Progress towards OT goals: Progressing toward goals  Acute Rehab OT Goals Patient Stated Goal: to go home OT Goal Formulation: With patient Time For Goal Achievement: 11/06/22 Potential to Achieve Goals: Good  Plan Discharge plan remains appropriate;Frequency remains appropriate    Co-evaluation                 AM-PAC OT "6 Clicks" Daily Activity     Outcome Measure   Help from another person eating meals?: None Help from another person taking care of personal grooming?: A Little Help from another person toileting, which includes using toliet, bedpan, or urinal?: A Little Help from another person bathing (including washing, rinsing, drying)?: A Lot Help from another person to put on and taking off regular upper body clothing?: None Help from another person to put on and taking off regular lower body clothing?: A Little 6 Click Score: 19    End of Session Equipment Utilized During Treatment: Rolling walker (2 wheels)  OT Visit Diagnosis: Other abnormalities of gait and mobility (R26.89);Muscle weakness (generalized) (M62.81)   Activity Tolerance Patient tolerated treatment well    Patient Left in bed;with call bell/phone within reach;with bed alarm set   Nurse Communication          Time: 1610-9604 OT Time Calculation (min): 33 min  Charges: OT General Charges $OT Visit: 1 Visit OT Treatments $Self Care/Home Management : 23-37 mins  Rockney Ghee, M.S., OTR/L 10/27/22, 11:27 AM

## 2022-10-27 NOTE — Discharge Instructions (Signed)
Prevena Wound Vac to be removed on 11/02/2022. Sponge bath until then. May shower afterwards.   No heavy lifting for 4 weeks. Do not lift anything more than 10 pounds or a gallon of milk.   No driving for 4 weeks.   Return to clinic for follow up visit as scheduled.

## 2022-10-28 ENCOUNTER — Telehealth (INDEPENDENT_AMBULATORY_CARE_PROVIDER_SITE_OTHER): Payer: Self-pay

## 2022-10-28 ENCOUNTER — Telehealth: Payer: Self-pay | Admitting: *Deleted

## 2022-10-28 NOTE — Telephone Encounter (Signed)
PT called wanting to know if Home health would come out to take the wound vac off on 11/01/2022.  I spoke with Sheppard Plumber NP she stated that home health does not come out for that.  All he needs to do is get in the shower and pull the plastic sticker off.  I called the pt and told him these directions and he stated verbal understanding.  Also he wants to know if he can leave the honeycomb dressing on his leg until his visit on 05/16 and he was told that would be fine.  Pt states understanding.

## 2022-10-28 NOTE — Transitions of Care (Post Inpatient/ED Visit) (Signed)
   10/28/2022  Name: [name] EPPS Sr. MRN: [id number] DOB: Apr 02, 1941  Today's TOC FU Call Status: Today's TOC FU Call Status:: Unsuccessul Call (1st Attempt) Unsuccessful Call (1st Attempt) Date: 10/28/22  Attempted to reach the patient regarding the most recent Inpatient/ED visit.  Follow Up Plan: Additional outreach attempts will be made to reach the patient to complete the Transitions of Care (Post Inpatient/ED visit) call.   Gean Maidens BSN RN [facility] Care Management [phone]

## 2022-10-28 NOTE — Transitions of Care (Post Inpatient/ED Visit) (Signed)
   10/28/2022  Name: [name] GURKA Sr. MRN: [id number] DOB: 02/01/1941  Today's TOC FU Call Status: Today's TOC FU Call Status:: Successful TOC FU Call Competed TOC FU Call Complete Date: 10/28/22  Transition Care Management Follow-up Telephone Call Date of Discharge: 10/27/22 Discharge Facility: [facility]) Type of Discharge: Inpatient Admission Primary Inpatient Discharge Diagnosis:: Atherosclerosis of artery of extremity with ulceration How have you been since you were released from the hospital?: Better Any questions or concerns?: Yes Patient Questions/Concerns:: Patient had questions about when to take the prevena vac off. He has put a call into his vascular surgeon to see if they can arrange for someone to come and take it off on [phone] Patient Questions/Concerns Addressed: Notified Provider of Patient Questions/Concerns (Patient notified vascular surgeon of concerns)  Items Reviewed: Did you receive and understand the discharge instructions provided?: Yes Medications obtained and verified?: Yes (Medications Reviewed) Any new allergies since your discharge?: No Dietary orders reviewed?: No Do you have support at home?: Yes People in Home: alone Name of Support/Comfort Primary Source: [facility] and Equipment/Supplies: Were Home Health Services Ordered?: NA Any new equipment or medical supplies ordered?: NA  Functional Questionnaire: Do you need assistance with bathing/showering or dressing?: No Do you need assistance with meal preparation?: Yes Do you need assistance with eating?: No Do you have difficulty maintaining continence: No Do you need assistance with getting out of bed/getting out of a chair/moving?: Yes Do you have difficulty managing or taking your medications?: No  Follow up appointments reviewed: PCP Follow-up appointment confirmed?: NA Specialist Hospital Follow-up appointment confirmed?: Yes Date of Specialist  follow-up appointment?: 11/13/22 Follow-Up Specialty Provider:: Dr Gilda Crease Do you need transportation to your follow-up appointment?: No Do you understand care options if your condition(s) worsen?: Yes-patient verbalized understanding  SDOH Interventions Today    Flowsheet Row Most Recent Value  SDOH Interventions   Food Insecurity Interventions Intervention Not Indicated  Housing Interventions Intervention Not Indicated  Transportation Interventions Intervention Not Indicated      Interventions Today    Flowsheet Row Most Recent Value  Chronic Disease   Chronic disease during today's visit Other  [Cad]  General Interventions   General Interventions Discussed/Reviewed General Interventions Discussed, General Interventions Reviewed, Doctor Visits, Referral to Nurse  Freada Bergeron to [facility]ie for Care Coordination.]  Doctor Visits Discussed/Reviewed Doctor Visits Discussed, Doctor Visits Reviewed  Mental Health Interventions   Mental Health Discussed/Reviewed Mental Health Discussed      TOC Interventions Today    Flowsheet Row Most Recent Value  TOC Interventions   TOC Interventions Discussed/Reviewed TOC Interventions Discussed, TOC Interventions Reviewed      Referred to [facility] for Care Coordination. Appointment [phone] 1:30  Gean Maidens BSN RN [facility] Care Management [phone]

## 2022-11-06 ENCOUNTER — Ambulatory Visit: Payer: Self-pay | Admitting: *Deleted

## 2022-11-06 ENCOUNTER — Encounter: Payer: Self-pay | Admitting: *Deleted

## 2022-11-06 NOTE — Patient Outreach (Signed)
  Care Coordination   Initial Visit Note   11/06/2022 Name: WACO GORSKI Sr. MRN: [id number] DOB: 07-23-1940  [name] is a 82 y.o. year old male who sees [name], [name], MD for primary care. I spoke with  [name] by phone today.  What matters to the patients health and wellness today?  Have staples/sutures removed from surgery site. State he will ask family to provide transportation to follow up appointment.     Goals Addressed             This Visit's Progress    Recover from vascular surgery       Care Coordination Interventions: Evaluation of current treatment plan related to s/p femoral bypass graft and patient's adherence to plan as established by provider Advised patient to keep incision site clean and dry Discussed plans with patient for ongoing care management follow up and provided patient with direct contact information for care management team Screening for signs and symptoms of depression related to chronic disease state  Assessed social determinant of health barriers         SDOH assessments and interventions completed:  Yes  SDOH Interventions    Flowsheet Row Telephone from 10/28/2022 in [facility] Coordination Clinical Support from 10/01/2022 in [facility] Clinical Support from 09/02/2021 in [facility] Chronic Care Management from 02/11/2021 in [facility] Clinical Support from 02/23/2020 [facility]  SDOH Interventions       Food Insecurity Interventions Intervention Not Indicated -- Intervention Not Indicated Intervention Not Indicated --  Housing Interventions Intervention Not Indicated -- Intervention Not Indicated -- --  Transportation Interventions Intervention Not Indicated Intervention Not Indicated Intervention Not Indicated Intervention Not Indicated --  Alcohol Usage Interventions  -- Intervention Not Indicated (Score <7) -- -- --  Financial Strain Interventions -- Intervention Not Indicated Intervention Not Indicated -- --  Physical Activity Interventions -- Intervention Not Indicated Patient Refused -- Patient Refused  Stress Interventions -- Intervention Not Indicated Intervention Not Indicated -- --  Social Connections Interventions -- Intervention Not Indicated Intervention Not Indicated -- --          Care Coordination Interventions:  Yes, provided   Interventions Today    Flowsheet Row Most Recent Value  Chronic Disease   Chronic disease during today's visit Other  [femoral bypass graft surgery]  General Interventions   General Interventions Discussed/Reviewed General Interventions Reviewed, Doctor Visits, Durable Medical Equipment (DME)  Doctor Visits Discussed/Reviewed Doctor Visits Reviewed, Annual Wellness Visits, PCP, Specialist  [Vascular on 5/16, nephrology on 6/13]  Durable Medical Equipment (DME) Dan Humphreys  PCP/Specialist Visits Compliance with follow-up visit  [AWV done on 4/3]  Education Interventions   Education Provided Provided Education  Provided Verbal Education On Other, Medication, When to see the doctor  [Discussed infection prevention and BP monitoring.  Usually range 100/70s, today 108/60.  Weigh's self daily, today 240 pounds. Taking  Xarelto and Plavix, state Xarelto can cost a bit once in donut hole, but ok for now]  Nutrition Interventions   Nutrition Discussed/Reviewed Nutrition Reviewed, Decreasing fats, Decreasing salt, Fluid intake       Follow up plan: Follow up call scheduled for 5/23    Encounter Outcome:  Pt. Visit Completed   Kemper Durie, RN, MSN, [facility] Care Management Care Management Coordinator [phone]

## 2022-11-11 ENCOUNTER — Other Ambulatory Visit (INDEPENDENT_AMBULATORY_CARE_PROVIDER_SITE_OTHER): Payer: Self-pay | Admitting: Vascular Surgery

## 2022-11-11 DIAGNOSIS — Z9889 Other specified postprocedural states: Secondary | ICD-10-CM

## 2022-11-12 NOTE — Progress Notes (Signed)
Patient ID: [name] GOFFREDO Sr., male   DOB: Oct 01, 1940, 82 y.o.   MRN: [id number]    HPI [name] SELBE Sr. is a 82 y.o. male.     The patient returns to the office for followup and review status post angiogram with intervention on 10/22/2022.   PROCEDURE: left common femoral artery to left posterior tibial tibial artery bypass with 6 mm distal flow graft Left profunda femoris endarterectomy endarterectomy  The patient notes improvement in the lower extremity symptoms. No interval shortening of the patient's claudication distance or rest pain symptoms. No new ulcers or wounds have occurred since the last visit.  There have been no significant changes to the patient's overall health care.  ABI's Rt=0.98 and Lt=0.98  (previous ABI's Rt=0.86 and Lt=0.36)    Past Medical History:  Diagnosis Date   A-fib [facility])    a.) CHA2DS2VASc = 5 (age x2, CHF, HTN, vascular disease history);  b.) rate/rhythm maintained on oral amiodarone; chronically anticoagulated with rivaroxaban   Allergy    Arthritis    CAD (coronary artery disease) 11/17/2012   a.) LHC 11/17/2012: 30% mLM, 30% mLAD, 30% mLCx, 50%/30% OM1, 30% pRCA - med mgmt   Chronic systolic CHF (congestive heart failure), NYHA class 2 (HCC)    a.) TTE 05/30/2015: EF 40%, glob HK, mild LVH, mild LAE, mild MR/TR; b.) TTE 10/14/2017: EF 40%, glob HK, mild LVH, mod BAE, triv PR, mild MR/TR, G1DD   Complication of anesthesia    a.) hypotension during routine colonoscopy   COPD (chronic obstructive pulmonary disease) (HCC)    DDD (degenerative disc disease), lumbar    Dilated cardiomyopathy (HCC)    a.) LHC 11/17/2012: EF 35%; b.) TTE 05/30/2015: EF 40%; c.) TTE 10/14/2017: EF 40%   GERD (gastroesophageal reflux disease)    History of chicken pox    Hyperlipidemia    Hypertension    Hypothyroidism    Long term current use of amiodarone    Long term current use of anticoagulant    a.) Rivaroxaban   Osteoporosis    Peripheral  vascular disease (HCC)    Pre-diabetes    Spinal stenosis of lumbar region without neurogenic claudication    Stage 3b chronic kidney disease (HCC)     Past Surgical History:  Procedure Laterality Date   BACK SURGERY     x5   DOPPLER ECHOCARDIOGRAPHY  06/21/2013   EF=35% while in Sinus Rhythm   ENDARTERECTOMY FEMORAL Left 10/22/2022   Procedure: ENDARTERECTOMY FEMORAL;  Surgeon: Renford Dills, MD;  Location: [facility] ORS;  Service: Vascular;  Laterality: Left;   FEMORAL-TIBIAL BYPASS GRAFT Left 10/22/2022   Procedure: BYPASS GRAFT FEMORAL-TIBIAL ARTERY;  Surgeon: Renford Dills, MD;  Location: [facility] ORS;  Service: Vascular;  Laterality: Left;   LEFT HEART CATH AND CORONARY ANGIOGRAPHY Left 11/17/2012   Procedure: LEFT HEART CATHETERIZATION AND CORONARY ANGIOGRAPHY; Location: [facility]; Surgeon: Marcina Millard, MD   LOWER EXTREMITY ANGIOGRAPHY Right 05/20/2022   Procedure: Lower Extremity Angiography;  Surgeon: Renford Dills, MD;  Location: [facility] INVASIVE CV LAB;  Service: Cardiovascular;  Laterality: Right;   LOWER EXTREMITY ANGIOGRAPHY Left 07/15/2022   Procedure: Lower Extremity Angiography;  Surgeon: Renford Dills, MD;  Location: [facility] INVASIVE CV LAB;  Service: Cardiovascular;  Laterality: Left;   Myocardial perfusion scan  10/28/2012   severe global LV enlargement. Mild RV enlargement. Mild LVH. Mild mitral and tricuspid insufficency   polyp excision  09/28/2005   UPPER GASTROINTESTINAL ENDOSCOPY  03/27/2004  Gastropathy, Gastritis; Duodenopathy      No Known Allergies  Current Outpatient Medications  Medication Sig Dispense Refill   amiodarone (PACERONE) 200 MG tablet Take 1 tablet (200 mg total) by mouth daily. 90 tablet 3   clopidogrel (PLAVIX) 75 MG tablet Take 1 tablet (75 mg total) by mouth daily. 30 tablet 11   fluticasone (FLONASE) 50 MCG/ACT nasal spray Place 2 sprays into both nostrils daily. 16 g 6   gabapentin (NEURONTIN) 300 MG capsule Take 1 capsule  (300 mg total) by mouth 2 (two) times daily. 60 capsule 5   levothyroxine (SYNTHROID) 100 MCG tablet TAKE 1 TABLET BY MOUTH DAILY BEFORE BREAKFAST 90 tablet 3   lisinopril (ZESTRIL) 2.5 MG tablet Take 1 tablet (2.5 mg total) by mouth daily. 90 tablet 0   lovastatin (MEVACOR) 20 MG tablet TAKE 1 TABLET(20 MG) BY MOUTH DAILY 90 tablet 3   oxyCODONE-acetaminophen (PERCOCET/ROXICET) 5-325 MG tablet Take 1-2 tablets by mouth every 4 (four) hours as needed for moderate pain. 30 tablet 0   torsemide (DEMADEX) 20 MG tablet Take by mouth in the morning and at bedtime.     traMADol (ULTRAM) 50 MG tablet Take 50 mg by mouth 2 (two) times daily as needed.     XARELTO 20 MG TABS tablet Take 1 tablet (20 mg total) by mouth daily. 90 tablet 3   No current facility-administered medications for this visit.        Physical Exam There were no vitals taken for this visit. Gen:  WD/WN, NAD Skin: incision C/D/I, staples present.  4+ edema of the foot, palpable left PT     Assessment/Plan: 1. Atherosclerosis of native arteries of the extremities with ulceration (HCC) Recommend:  The patient is status post successful femoral-tibial bypass left lower extremity.  The patient reports that the claudication symptoms and leg pain has improved.   The patient denies lifestyle limiting changes at this point in time.  No further invasive studies, angiography or surgery at this time The patient should continue walking and begin a more formal exercise program.  The patient should continue antiplatelet therapy and aggressive treatment of the lipid abnormalities  Continued surveillance is indicated as atherosclerosis is likely to progress with time.    Patient should undergo noninvasive studies as ordered. The patient will follow up with me to review the studies.  He will follow-up with me in 1 week to remove the remaining staples.  He will begin using an Ace wrap daily to help with the reperfusion edema.  He will  also start elevating to help control the edema.     Levora Dredge 11/12/2022, 3:29 PM   This note was created with Dragon medical transcription system.  Any errors from dictation are unintentional.

## 2022-11-13 ENCOUNTER — Ambulatory Visit (INDEPENDENT_AMBULATORY_CARE_PROVIDER_SITE_OTHER): Payer: PPO

## 2022-11-13 ENCOUNTER — Ambulatory Visit (INDEPENDENT_AMBULATORY_CARE_PROVIDER_SITE_OTHER): Payer: PPO | Admitting: Vascular Surgery

## 2022-11-13 VITALS — BP 104/44 | HR 58 | Resp 18 | Ht 74.0 in | Wt 251.0 lb

## 2022-11-13 DIAGNOSIS — I739 Peripheral vascular disease, unspecified: Secondary | ICD-10-CM

## 2022-11-13 DIAGNOSIS — Z9889 Other specified postprocedural states: Secondary | ICD-10-CM | POA: Diagnosis not present

## 2022-11-13 DIAGNOSIS — I7025 Atherosclerosis of native arteries of other extremities with ulceration: Secondary | ICD-10-CM

## 2022-11-14 LAB — VAS US ABI WITH/WO TBI
Left ABI: 0.98
Right ABI: 0.98

## 2022-11-16 ENCOUNTER — Encounter (INDEPENDENT_AMBULATORY_CARE_PROVIDER_SITE_OTHER): Payer: Self-pay | Admitting: Vascular Surgery

## 2022-11-20 ENCOUNTER — Encounter (INDEPENDENT_AMBULATORY_CARE_PROVIDER_SITE_OTHER): Payer: Self-pay | Admitting: Nurse Practitioner

## 2022-11-20 ENCOUNTER — Encounter: Payer: PPO | Admitting: *Deleted

## 2022-11-20 ENCOUNTER — Encounter (INDEPENDENT_AMBULATORY_CARE_PROVIDER_SITE_OTHER): Payer: PPO

## 2022-11-20 ENCOUNTER — Ambulatory Visit: Payer: Self-pay | Admitting: *Deleted

## 2022-11-20 ENCOUNTER — Ambulatory Visit (INDEPENDENT_AMBULATORY_CARE_PROVIDER_SITE_OTHER): Payer: PPO | Admitting: Nurse Practitioner

## 2022-11-20 VITALS — BP 156/65 | HR 71 | Resp 18 | Ht 74.0 in | Wt 251.0 lb

## 2022-11-20 DIAGNOSIS — I739 Peripheral vascular disease, unspecified: Secondary | ICD-10-CM

## 2022-11-20 DIAGNOSIS — Z9889 Other specified postprocedural states: Secondary | ICD-10-CM

## 2022-11-20 MED ORDER — MUPIROCIN 2 % EX OINT: 1.0000 | TOPICAL_OINTMENT | Freq: Every day | CUTANEOUS | 0 refills | Status: DC

## 2022-11-20 NOTE — Patient Outreach (Signed)
  Care Coordination   Follow Up Visit Note   11/20/2022 Name: [name] DERKSEN Sr. MRN: [id number] DOB: Nov 02, 1940  [name] Prom Sr. is a 82 y.o. year old male who sees [name], [name], MD for primary care. I spoke with  [name] Prom Sr. by phone today.  What matters to the patients health and wellness today?  Continue healing from surgery    Goals Addressed             This Visit's Progress    Recover from vascular surgery   On track    Care Coordination Interventions: Evaluation of current treatment plan related to s/p femoral bypass graft and patient's adherence to plan as established by provider Advised patient to keep incision site clean and dry Discussed plans with patient for ongoing care management follow up and provided patient with direct contact information for care management team Screening for signs and symptoms of depression related to chronic disease state  Assessed social determinant of health barriers         SDOH assessments and interventions completed:  No     Care Coordination Interventions:  Yes, provided   Interventions Today    Flowsheet Row Most Recent Value  Chronic Disease   Chronic disease during today's visit Other  [vascular surgery, staples were removed today, he has been cleared to drive again]  General Interventions   General Interventions Discussed/Reviewed General Interventions Reviewed, Doctor Visits  [Surgery follow up 6/10 and nephrology 6/16]  Durable Medical Equipment (DME) BP Cuff  [monitoring BP, HR, and oxygen saturations daily, report all normal]  PCP/Specialist Visits Compliance with follow-up visit  Education Interventions   Education Provided Provided Education  Provided Verbal Education On Medication, When to see the doctor  [Discussed medication management, taking Amiodarone and Xarelto, report he is able to afford them]       Follow up plan: Follow up call scheduled for 6/21    Encounter Outcome:   Pt. Visit Completed   Kemper Durie, RN, MSN, [facility] Care Management Care Management Coordinator [phone]

## 2022-11-24 ENCOUNTER — Encounter (INDEPENDENT_AMBULATORY_CARE_PROVIDER_SITE_OTHER): Payer: Self-pay | Admitting: Nurse Practitioner

## 2022-11-24 NOTE — Progress Notes (Signed)
Subjective:    Patient ID: [name] Prom Sr., male    DOB: 1941-02-17, 82 y.o.   MRN: [id number] Chief Complaint  Patient presents with   Follow-up    1 week ABI +    The patient returns today for wound check following intervention on 10/22/2022, including:  PROCEDURE: 1. left common femoral artery to left posterior tibial tibial artery bypass with 6 mm distal flow graft 2. Left profunda femoris endarterectomy endarterectomy   The patient has some staples left in his lower leg.  He has a just a small area of dehiscence near the distal portion.  There is also a small wound in his groin portion.  He has been placing Neosporin over these.  He denies any significant pain but he still continues to have some swelling of his lower leg.    Review of Systems  Skin:  Positive for wound.  All other systems reviewed and are negative.      Objective:   Physical Exam Vitals reviewed.  HENT:     Head: Normocephalic.  Cardiovascular:     Rate and Rhythm: Normal rate.     Pulses:          Dorsalis pedis pulses are 1+ on the left side.  Pulmonary:     Effort: Pulmonary effort is normal.  Musculoskeletal:     Left lower leg: Edema present.  Skin:    General: Skin is warm.  Neurological:     Mental Status: He is alert and oriented to person, place, and time.  Psychiatric:        Mood and Affect: Mood normal.        Behavior: Behavior normal.        Thought Content: Thought content normal.        Judgment: Judgment normal.     BP (!) 156/65 (BP Location: Left Wrist)   Pulse 71   Resp 18   Ht 6\' 2"  (1.88 m)   Wt 251 lb (113.9 kg)   BMI 32.23 kg/m   Past Medical History:  Diagnosis Date   A-fib (HCC)    a.) CHA2DS2VASc = 5 (age x2, CHF, HTN, vascular disease history);  b.) rate/rhythm maintained on oral amiodarone; chronically anticoagulated with rivaroxaban   Allergy    Arthritis    CAD (coronary artery disease) 11/17/2012   a.) LHC 11/17/2012: 30% mLM, 30% mLAD, 30%  mLCx, 50%/30% OM1, 30% pRCA - med mgmt   Chronic systolic CHF (congestive heart failure), NYHA class 2 (HCC)    a.) TTE 05/30/2015: EF 40%, glob HK, mild LVH, mild LAE, mild MR/TR; b.) TTE 10/14/2017: EF 40%, glob HK, mild LVH, mod BAE, triv PR, mild MR/TR, G1DD   Complication of anesthesia    a.) hypotension during routine colonoscopy   COPD (chronic obstructive pulmonary disease) (HCC)    DDD (degenerative disc disease), lumbar    Dilated cardiomyopathy (HCC)    a.) LHC 11/17/2012: EF 35%; b.) TTE 05/30/2015: EF 40%; c.) TTE 10/14/2017: EF 40%   GERD (gastroesophageal reflux disease)    History of chicken pox    Hyperlipidemia    Hypertension    Hypothyroidism    Long term current use of amiodarone    Long term current use of anticoagulant    a.) Rivaroxaban   Osteoporosis    Peripheral vascular disease (HCC)    Pre-diabetes    Spinal stenosis of lumbar region without neurogenic claudication    Stage 3b chronic kidney disease (HCC)  Social History   Socioeconomic History   Marital status: Widowed    Spouse name: Not on file   Number of children: 2   Years of education: Not on file   Highest education level: 12th grade  Occupational History   Occupation: Retired/ Disabled  Tobacco Use   Smoking status: Former    Packs/day: 2.00    Years: 50.00    Additional pack years: 0.00    Total pack years: 100.00    Types: Cigarettes    Quit date: 07/01/2007    Years since quitting: 15.4   Smokeless tobacco: Never  Vaping Use   Vaping Use: Never used  Substance and Sexual Activity   Alcohol use: No    Alcohol/week: 0.0 standard drinks of alcohol   Drug use: No   Sexual activity: Not on file  Other Topics Concern   Not on file  Social History Narrative   Not on file   Social Determinants of Health   Financial Resource Strain: Low Risk  (10/01/2022)   Overall Financial Resource Strain (CARDIA)    Difficulty of Paying Living Expenses: Not hard at all  Food Insecurity: No  Food Insecurity (10/28/2022)   Hunger Vital Sign    Worried About Running Out of Food in the Last Year: Never true    Ran Out of Food in the Last Year: Never true  Transportation Needs: No Transportation Needs (10/28/2022)   PRAPARE - Administrator, Civil Service (Medical): No    Lack of Transportation (Non-Medical): No  Physical Activity: Inactive (10/01/2022)   Exercise Vital Sign    Days of Exercise per Week: 0 days    Minutes of Exercise per Session: 0 min  Stress: No Stress Concern Present (10/01/2022)   [employer] of Occupational Health - Occupational Stress Questionnaire    Feeling of Stress : Not at all  Social Connections: Socially Isolated (10/01/2022)   Social Connection and Isolation Panel [NHANES]    Frequency of Communication with Friends and Family: More than three times a week    Frequency of Social Gatherings with Friends and Family: Once a week    Attends Religious Services: Never    Database administrator or Organizations: No    Attends Banker Meetings: Never    Marital Status: Widowed  Intimate Partner Violence: Not At Risk (10/22/2022)   Humiliation, Afraid, Rape, and Kick questionnaire    Fear of Current or Ex-Partner: No    Emotionally Abused: No    Physically Abused: No    Sexually Abused: No    Past Surgical History:  Procedure Laterality Date   BACK SURGERY     x5   DOPPLER ECHOCARDIOGRAPHY  06/21/2013   EF=35% while in Sinus Rhythm   ENDARTERECTOMY FEMORAL Left 10/22/2022   Procedure: ENDARTERECTOMY FEMORAL;  Surgeon: Renford Dills, MD;  Location: [facility] ORS;  Service: Vascular;  Laterality: Left;   FEMORAL-TIBIAL BYPASS GRAFT Left 10/22/2022   Procedure: BYPASS GRAFT FEMORAL-TIBIAL ARTERY;  Surgeon: Renford Dills, MD;  Location: [facility] ORS;  Service: Vascular;  Laterality: Left;   LEFT HEART CATH AND CORONARY ANGIOGRAPHY Left 11/17/2012   Procedure: LEFT HEART CATHETERIZATION AND CORONARY ANGIOGRAPHY; Location: [facility];  Surgeon: Marcina Millard, MD   LOWER EXTREMITY ANGIOGRAPHY Right 05/20/2022   Procedure: Lower Extremity Angiography;  Surgeon: Renford Dills, MD;  Location: [facility] INVASIVE CV LAB;  Service: Cardiovascular;  Laterality: Right;   LOWER EXTREMITY ANGIOGRAPHY Left 07/15/2022   Procedure: Lower Extremity Angiography;  Surgeon: Renford Dills, MD;  Location: [facility] INVASIVE CV LAB;  Service: Cardiovascular;  Laterality: Left;   Myocardial perfusion scan  10/28/2012   severe global LV enlargement. Mild RV enlargement. Mild LVH. Mild mitral and tricuspid insufficency   polyp excision  09/28/2005   UPPER GASTROINTESTINAL ENDOSCOPY  03/27/2004   Gastropathy, Gastritis; Duodenopathy    Family History  Problem Relation Age of Onset   Alzheimer's disease Mother    Heart attack Father    Colon cancer Brother     No Known Allergies     Latest Ref Rng & Units 10/26/2022    8:53 AM 10/25/2022    4:36 PM 10/24/2022    4:23 AM  CBC  WBC 4.0 - 10.5 K/uL 6.8  7.2  7.4   Hemoglobin 13.0 - 17.0 g/dL 9.0  8.2  9.0   Hematocrit 39.0 - 52.0 % 28.3  25.7  28.6   Platelets 150 - 400 K/uL 154  146  153       CMP     Component Value Date/Time   NA 133 (L) 10/26/2022 0853   NA 140 01/07/2022 0925   K 3.9 10/26/2022 0853   CL 105 10/26/2022 0853   CO2 22 10/26/2022 0853   GLUCOSE 115 (H) 10/26/2022 0853   BUN 35 (H) 10/26/2022 0853   BUN 32 (H) 01/07/2022 0925   CREATININE 1.66 (H) 10/26/2022 0853   CREATININE 1.71 (H) 03/19/2017 0813   CALCIUM 8.4 (L) 10/26/2022 0853   PROT 6.1 (L) 10/26/2022 0853   PROT 6.3 01/07/2022 0925   ALBUMIN 2.8 (L) 10/26/2022 0853   ALBUMIN 3.8 01/07/2022 0925   AST 30 10/26/2022 0853   ALT 16 10/26/2022 0853   ALKPHOS 124 10/26/2022 0853   BILITOT 1.6 (H) 10/26/2022 0853   BILITOT 0.8 01/07/2022 0925   GFRNONAA 41 (L) 10/26/2022 0853   GFRNONAA 39 (L) 03/19/2017 0813   GFRAA 39 (L) 04/04/2020 0808   GFRAA 45 (L) 03/19/2017 0813     VAS [place] ABI  WITH/WO TBI  Result Date: 11/14/2022  LOWER EXTREMITY DOPPLER STUDY Patient Name:  [name] MCCOLE Sr.  Date of Exam:   11/13/2022 Medical Rec #: [id number]             Accession #:    [id number] Date of Birth: 12-25-1940             Patient Gender: M Patient Age:   33 years Exam Location:  [facility] Procedure:      VAS [place] ABI WITH/WO TBI Referring Phys: Levora Dredge --------------------------------------------------------------------------------  Indications: Rest pain, ulceration, and peripheral artery disease.  Vascular Interventions: 05/20/2022: PTA and Stent placement Right SFA and                         Popliteal Artery.                          07/15/2022: Abdominal Aortogram. Selctive injection of                         the Left Lower Extremity third order catheter placement.                         Ultrasound guided access to the Right CFA. Comparison Study: 06/11/2022 Performing Technologist: Debbe Bales RVS  Examination Guidelines: A complete evaluation includes at  minimum, Doppler waveform signals and systolic blood pressure reading at the level of bilateral brachial, anterior tibial, and posterior tibial arteries, when vessel segments are accessible. Bilateral testing is considered an integral part of a complete examination. Photoelectric Plethysmograph (PPG) waveforms and toe systolic pressure readings are included as required and additional duplex testing as needed. Limited examinations for reoccurring indications may be performed as noted.  ABI Findings: +---------+------------------+-----+---------+--------+ Right    Rt Pressure (mmHg)IndexWaveform Comment  +---------+------------------+-----+---------+--------+ Brachial 129                                      +---------+------------------+-----+---------+--------+ ATA      99                0.77 biphasic          +---------+------------------+-----+---------+--------+ PTA      127               0.98  triphasic         +---------+------------------+-----+---------+--------+ Great Toe119               0.92 Normal            +---------+------------------+-----+---------+--------+ +---------+------------------+-----+---------+-------+ Left     Lt Pressure (mmHg)IndexWaveform Comment +---------+------------------+-----+---------+-------+ Brachial 129                                     +---------+------------------+-----+---------+-------+ ATA      127               0.98 biphasic         +---------+------------------+-----+---------+-------+ PTA      126               0.98 triphasic        +---------+------------------+-----+---------+-------+ Great Toe129               1.00 Normal           +---------+------------------+-----+---------+-------+ +-------+-----------+-----------+------------+------------+ ABI/TBIToday's ABIToday's TBIPrevious ABIPrevious TBI +-------+-----------+-----------+------------+------------+ Right  .98        .92        .86         1.02         +-------+-----------+-----------+------------+------------+ Left   .98        1.00       .36         .32          +-------+-----------+-----------+------------+------------+ Left ABIs and TBIs appear increased compared to prior study on 06/11/2022. Right ABIs appear increased compared to prior study on 06/11/2022. Right TBIs appear to be decreased compared to prior study on 06/11/2022.  Summary: Right: Resting right ankle-brachial index is within normal range. The right toe-brachial index is normal. Left: Resting left ankle-brachial index is within normal range. The left toe-brachial index is normal. *See table(s) above for measurements and observations.  Electronically signed by Levora Dredge MD on 11/14/2022 at 8:35:37 AM.    Final        Assessment & Plan:   1. Peripheral arterial disease with history of revascularization (HCC) The additional staples removed.  Advised the patient to  utilize mupirocin ointment versus Neosporin as this can sometimes cause irritation.  We have also placed a wrap on the patient's lower leg in order to help with the swelling and edema.  The patient return in 3 weeks  to repeat studies as well as to reevaluate wound status.   Current Outpatient Medications on File Prior to Visit  Medication Sig Dispense Refill   amiodarone (PACERONE) 200 MG tablet Take 1 tablet (200 mg total) by mouth daily. 90 tablet 3   fluticasone (FLONASE) 50 MCG/ACT nasal spray Place 2 sprays into both nostrils daily. 16 g 6   gabapentin (NEURONTIN) 300 MG capsule Take 1 capsule (300 mg total) by mouth 2 (two) times daily. 60 capsule 5   gabapentin (NEURONTIN) 300 MG capsule Take 300 mg by mouth 2 (two) times daily.     levothyroxine (SYNTHROID) 100 MCG tablet TAKE 1 TABLET BY MOUTH DAILY BEFORE BREAKFAST 90 tablet 3   lisinopril (ZESTRIL) 2.5 MG tablet Take 1 tablet (2.5 mg total) by mouth daily. 90 tablet 0   lovastatin (MEVACOR) 20 MG tablet TAKE 1 TABLET(20 MG) BY MOUTH DAILY 90 tablet 3   naloxone (NARCAN) nasal spray 4 mg/0.1 mL SMARTSIG:Both Nares     oxyCODONE-acetaminophen (PERCOCET/ROXICET) 5-325 MG tablet Take 1-2 tablets by mouth every 4 (four) hours as needed for moderate pain. 30 tablet 0   torsemide (DEMADEX) 20 MG tablet Take by mouth in the morning and at bedtime.     traMADol (ULTRAM) 50 MG tablet Take 50 mg by mouth 2 (two) times daily as needed.     XARELTO 20 MG TABS tablet Take 1 tablet (20 mg total) by mouth daily. 90 tablet 3   clopidogrel (PLAVIX) 75 MG tablet Take 1 tablet (75 mg total) by mouth daily. (Patient not taking: Reported on 11/20/2022) 30 tablet 11   No current facility-administered medications on file prior to visit.    There are no Patient Instructions on file for this visit. No follow-ups on file.   Georgiana Spinner, NP

## 2022-12-03 ENCOUNTER — Other Ambulatory Visit (INDEPENDENT_AMBULATORY_CARE_PROVIDER_SITE_OTHER): Payer: Self-pay | Admitting: Nurse Practitioner

## 2022-12-03 DIAGNOSIS — I739 Peripheral vascular disease, unspecified: Secondary | ICD-10-CM

## 2022-12-05 ENCOUNTER — Telehealth: Payer: Self-pay

## 2022-12-05 DIAGNOSIS — I5022 Chronic systolic (congestive) heart failure: Secondary | ICD-10-CM

## 2022-12-05 DIAGNOSIS — I7025 Atherosclerosis of native arteries of other extremities with ulceration: Secondary | ICD-10-CM

## 2022-12-05 NOTE — Telephone Encounter (Signed)
PharmD reviewed patient chart to assess eligibility for [facility] Coordination services. Patient was determined to be a good candidate for the program given the complexity of the medication regimen and/or overall risk for hospitalization and increased utilization.  Referral entered in order to outreach patient and offer appointment with PharmD. Referral cosigned to PCP.  

## 2022-12-08 ENCOUNTER — Ambulatory Visit (INDEPENDENT_AMBULATORY_CARE_PROVIDER_SITE_OTHER): Payer: PPO | Admitting: Vascular Surgery

## 2022-12-08 ENCOUNTER — Ambulatory Visit (INDEPENDENT_AMBULATORY_CARE_PROVIDER_SITE_OTHER): Payer: PPO

## 2022-12-08 ENCOUNTER — Encounter (INDEPENDENT_AMBULATORY_CARE_PROVIDER_SITE_OTHER): Payer: Self-pay | Admitting: Vascular Surgery

## 2022-12-08 VITALS — BP 82/40 | HR 47 | Resp 15

## 2022-12-08 DIAGNOSIS — I739 Peripheral vascular disease, unspecified: Secondary | ICD-10-CM | POA: Diagnosis not present

## 2022-12-08 DIAGNOSIS — Z9889 Other specified postprocedural states: Secondary | ICD-10-CM | POA: Diagnosis not present

## 2022-12-08 DIAGNOSIS — I7025 Atherosclerosis of native arteries of other extremities with ulceration: Secondary | ICD-10-CM

## 2022-12-08 NOTE — Progress Notes (Signed)
Patient ID: [name] STRAUGHAN Sr., male   DOB: 1940-11-29, 82 y.o.   MRN: [id number]  Chief Complaint  Patient presents with   Follow-up    [facility] 2 week with ABI    HPI PRINCEMICHAEL GRAVIER Sr. is a 82 y.o. male.    Patient returns today swelling still feels pretty significant but he is not having any pain on his foot.  He denies any problems with his incisions.  The ABIs obtained today Rt=1.08 (triphasic) and Lt=1.17 (biphasic) (previous ABI's Rt=0.38 and Lt=0.98)   Past Medical History:  Diagnosis Date   A-fib [facility])    a.) CHA2DS2VASc = 5 (age x2, CHF, HTN, vascular disease history);  b.) rate/rhythm maintained on oral amiodarone; chronically anticoagulated with rivaroxaban   Allergy    Arthritis    CAD (coronary artery disease) 11/17/2012   a.) LHC 11/17/2012: 30% mLM, 30% mLAD, 30% mLCx, 50%/30% OM1, 30% pRCA - med mgmt   Chronic systolic CHF (congestive heart failure), NYHA class 2 (HCC)    a.) TTE 05/30/2015: EF 40%, glob HK, mild LVH, mild LAE, mild MR/TR; b.) TTE 10/14/2017: EF 40%, glob HK, mild LVH, mod BAE, triv PR, mild MR/TR, G1DD   Complication of anesthesia    a.) hypotension during routine colonoscopy   COPD (chronic obstructive pulmonary disease) (HCC)    DDD (degenerative disc disease), lumbar    Dilated cardiomyopathy (HCC)    a.) LHC 11/17/2012: EF 35%; b.) TTE 05/30/2015: EF 40%; c.) TTE 10/14/2017: EF 40%   GERD (gastroesophageal reflux disease)    History of chicken pox    Hyperlipidemia    Hypertension    Hypothyroidism    Long term current use of amiodarone    Long term current use of anticoagulant    a.) Rivaroxaban   Osteoporosis    Peripheral vascular disease (HCC)    Pre-diabetes    Spinal stenosis of lumbar region without neurogenic claudication    Stage 3b chronic kidney disease (HCC)     Past Surgical History:  Procedure Laterality Date   BACK SURGERY     x5   DOPPLER ECHOCARDIOGRAPHY  06/21/2013   EF=35% while in Sinus Rhythm    ENDARTERECTOMY FEMORAL Left 10/22/2022   Procedure: ENDARTERECTOMY FEMORAL;  Surgeon: Renford Dills, MD;  Location: [facility] ORS;  Service: Vascular;  Laterality: Left;   FEMORAL-TIBIAL BYPASS GRAFT Left 10/22/2022   Procedure: BYPASS GRAFT FEMORAL-TIBIAL ARTERY;  Surgeon: Renford Dills, MD;  Location: [facility] ORS;  Service: Vascular;  Laterality: Left;   LEFT HEART CATH AND CORONARY ANGIOGRAPHY Left 11/17/2012   Procedure: LEFT HEART CATHETERIZATION AND CORONARY ANGIOGRAPHY; Location: [facility]; Surgeon: Marcina Millard, MD   LOWER EXTREMITY ANGIOGRAPHY Right 05/20/2022   Procedure: Lower Extremity Angiography;  Surgeon: Renford Dills, MD;  Location: [facility] INVASIVE CV LAB;  Service: Cardiovascular;  Laterality: Right;   LOWER EXTREMITY ANGIOGRAPHY Left 07/15/2022   Procedure: Lower Extremity Angiography;  Surgeon: Renford Dills, MD;  Location: [facility] INVASIVE CV LAB;  Service: Cardiovascular;  Laterality: Left;   Myocardial perfusion scan  10/28/2012   severe global LV enlargement. Mild RV enlargement. Mild LVH. Mild mitral and tricuspid insufficency   polyp excision  09/28/2005   UPPER GASTROINTESTINAL ENDOSCOPY  03/27/2004   Gastropathy, Gastritis; Duodenopathy      No Known Allergies  Current Outpatient Medications  Medication Sig Dispense Refill   amiodarone (PACERONE) 200 MG tablet Take 1 tablet (200 mg total) by mouth daily. 90 tablet 3  fluticasone (FLONASE) 50 MCG/ACT nasal spray Place 2 sprays into both nostrils daily. 16 g 6   gabapentin (NEURONTIN) 300 MG capsule Take 1 capsule (300 mg total) by mouth 2 (two) times daily. 60 capsule 5   gabapentin (NEURONTIN) 300 MG capsule Take 300 mg by mouth 2 (two) times daily.     levothyroxine (SYNTHROID) 100 MCG tablet TAKE 1 TABLET BY MOUTH DAILY BEFORE BREAKFAST 90 tablet 3   lisinopril (ZESTRIL) 2.5 MG tablet Take 1 tablet (2.5 mg total) by mouth daily. 90 tablet 0   lovastatin (MEVACOR) 20 MG tablet TAKE 1 TABLET(20 MG) BY  MOUTH DAILY 90 tablet 3   mupirocin ointment (BACTROBAN) 2 % Apply 1 Application topically daily. 22 g 0   naloxone (NARCAN) nasal spray 4 mg/0.1 mL SMARTSIG:Both Nares     oxyCODONE-acetaminophen (PERCOCET/ROXICET) 5-325 MG tablet Take 1-2 tablets by mouth every 4 (four) hours as needed for moderate pain. 30 tablet 0   torsemide (DEMADEX) 20 MG tablet Take by mouth in the morning and at bedtime.     traMADol (ULTRAM) 50 MG tablet Take 50 mg by mouth 2 (two) times daily as needed.     XARELTO 20 MG TABS tablet Take 1 tablet (20 mg total) by mouth daily. 90 tablet 3   clopidogrel (PLAVIX) 75 MG tablet Take 1 tablet (75 mg total) by mouth daily. (Patient not taking: Reported on 11/20/2022) 30 tablet 11   No current facility-administered medications for this visit.        Physical Exam BP (!) 82/40 (BP Location: Right Arm)   Pulse (!) 47   Resp 15  Gen:  WD/WN, NAD Skin: incision C/D/I; trace palpable PT on left     Assessment/Plan: 1. Atherosclerosis of native arteries of the extremities with ulceration (HCC) Recommend:  The patient is status post successful bypass.  The patient reports that the leg pain has improved.   The patient denies lifestyle limiting changes at this point in time.  No further invasive studies, angiography or surgery at this time The patient should continue walking and begin a more formal exercise program.  The patient should continue antiplatelet therapy and aggressive treatment of the lipid abnormalities  Continued surveillance is indicated as atherosclerosis is likely to progress with time.    Patient should undergo noninvasive studies as ordered. The patient will follow up with me to review the studies.  - VAS [place] LOWER EXTREMITY ARTERIAL DUPLEX; Future - VAS [place] ABI WITH/WO TBI; Future      Levora Dredge 12/08/2022, 3:09 PM   This note was created with Dragon medical transcription system.  Any errors from dictation are unintentional.

## 2022-12-11 DIAGNOSIS — E785 Hyperlipidemia, unspecified: Secondary | ICD-10-CM | POA: Diagnosis not present

## 2022-12-11 DIAGNOSIS — N1832 Chronic kidney disease, stage 3b: Secondary | ICD-10-CM | POA: Diagnosis not present

## 2022-12-11 DIAGNOSIS — I4821 Permanent atrial fibrillation: Secondary | ICD-10-CM | POA: Diagnosis not present

## 2022-12-11 DIAGNOSIS — I509 Heart failure, unspecified: Secondary | ICD-10-CM | POA: Diagnosis not present

## 2022-12-11 DIAGNOSIS — R609 Edema, unspecified: Secondary | ICD-10-CM | POA: Diagnosis not present

## 2022-12-11 DIAGNOSIS — I10 Essential (primary) hypertension: Secondary | ICD-10-CM | POA: Diagnosis not present

## 2022-12-11 LAB — VAS US ABI WITH/WO TBI
Left ABI: 1.17
Right ABI: 1.08

## 2022-12-15 DIAGNOSIS — I4821 Permanent atrial fibrillation: Secondary | ICD-10-CM | POA: Diagnosis not present

## 2022-12-15 DIAGNOSIS — E785 Hyperlipidemia, unspecified: Secondary | ICD-10-CM | POA: Diagnosis not present

## 2022-12-15 DIAGNOSIS — R82998 Other abnormal findings in urine: Secondary | ICD-10-CM | POA: Diagnosis not present

## 2022-12-15 DIAGNOSIS — G8918 Other acute postprocedural pain: Secondary | ICD-10-CM | POA: Diagnosis not present

## 2022-12-15 DIAGNOSIS — N1832 Chronic kidney disease, stage 3b: Secondary | ICD-10-CM | POA: Diagnosis not present

## 2022-12-15 DIAGNOSIS — I10 Essential (primary) hypertension: Secondary | ICD-10-CM | POA: Diagnosis not present

## 2022-12-15 DIAGNOSIS — R609 Edema, unspecified: Secondary | ICD-10-CM | POA: Diagnosis not present

## 2022-12-15 DIAGNOSIS — I509 Heart failure, unspecified: Secondary | ICD-10-CM | POA: Diagnosis not present

## 2022-12-19 ENCOUNTER — Ambulatory Visit: Payer: Self-pay | Admitting: *Deleted

## 2022-12-19 NOTE — Patient Outreach (Signed)
  Care Coordination   Follow Up Visit Note   12/19/2022 Name: [name] LACHMAN Sr. MRN: [id number] DOB: 1941/03/12  [name] Prom Sr. is a 82 y.o. year old male who sees [name], [name], MD for primary care. I spoke with  [name] Prom Sr. by phone today.  What matters to the patients health and wellness today?  Continue recovery from surgery and keep BP/HR under control    Goals Addressed             This Visit's Progress    Effective management of chronic medical conditions       Recover from vascular surgery   On track    Care Coordination Interventions: Evaluation of current treatment plan related to s/p femoral bypass graft and patient's adherence to plan as established by provider Advised patient to keep incision site clean and dry Discussed plans with patient for ongoing care management follow up and provided patient with direct contact information for care management team         SDOH assessments and interventions completed:  No     Care Coordination Interventions:  Yes, provided   Interventions Today    Flowsheet Row Most Recent Value  Chronic Disease   Chronic disease during today's visit Congestive Heart Failure (CHF), Atrial Fibrillation (AFib)  General Interventions   General Interventions Discussed/Reviewed General Interventions Reviewed, Doctor Visits, Communication with, Durable Medical Equipment (DME)  Doctor Visits Discussed/Reviewed Doctor Visits Reviewed  [place] 9/16, nephrology 9/24, advised of need to schedule PCP]  Durable Medical Equipment (DME) BP Cuff  [Patient monitoring BP daily, range 80s-130s/50-60s]  Communication with PCP/Specialists  [contacted PCP office to inquire when next visit is due, requested call be placed to patient to schedule]  Exercise Interventions   Exercise Discussed/Reviewed Weight Managment  Weight Management Weight maintenance  [weight today 240 pounds, state this is stable]  Education Interventions    Education Provided Provided Education  Provided Verbal Education On When to see the doctor, Medication       Follow up plan: Follow up call scheduled for 7/23    Encounter Outcome:  Pt. Visit Completed   Kemper Durie, RN, MSN, [facility] Care Management Care Management Coordinator [phone]

## 2022-12-23 ENCOUNTER — Telehealth: Payer: Self-pay

## 2022-12-23 NOTE — Telephone Encounter (Signed)
Please schedule with me to transition care as PCP for July or Aug 2024

## 2022-12-23 NOTE — Telephone Encounter (Signed)
-----   Message from Adline Peals, CMA sent at 12/23/2022  8:30 AM EDT ----- Regarding: FW: PCP appointment  ----- Message ----- From: Kemper Durie, RN Sent: 12/19/2022   9:42 PM EDT To: Kemper Durie, RN; Shelly Bombard, CMA; # Subject: PCP appointment                                Mr. Gauthreaux does not have an appointment scheduled for physical or any regular follow up.  He was not sure when he needed to return.  Would someone be able to follow up with him to schedule? Thanks, Kemper Durie, RN, MSN, [facility] Care Management Care Management Coordinator [phone]

## 2022-12-24 NOTE — Telephone Encounter (Signed)
This has already been done.  Pt has appt on 01/27/2023 at 820am

## 2023-01-20 ENCOUNTER — Ambulatory Visit: Payer: Self-pay | Admitting: *Deleted

## 2023-01-20 ENCOUNTER — Other Ambulatory Visit: Payer: Self-pay | Admitting: Family Medicine

## 2023-01-20 DIAGNOSIS — I5040 Unspecified combined systolic (congestive) and diastolic (congestive) heart failure: Secondary | ICD-10-CM

## 2023-01-20 DIAGNOSIS — I5022 Chronic systolic (congestive) heart failure: Secondary | ICD-10-CM

## 2023-01-20 DIAGNOSIS — I420 Dilated cardiomyopathy: Secondary | ICD-10-CM

## 2023-01-20 NOTE — Patient Outreach (Signed)
  Care Coordination   Follow Up Visit Note   01/20/2023 Name: [name] COLT Sr. MRN: [id number] DOB: [birth year]  [name] Prom Sr. is a 82 y.o. year old male who sees [name], [name], MD for primary care. I spoke with  [name] Prom Sr. by phone today.  What matters to the patients health and wellness today?  Stay healthy and out of the hospital, attend PCP visit    Goals Addressed             This Visit's Progress    Effective management of chronic medical conditions       Interventions Today    Flowsheet Row Most Recent Value  Chronic Disease   Chronic disease during today's visit Congestive Heart Failure (CHF), Atrial Fibrillation (AFib), Hypertension (HTN), Chronic Kidney Disease/End Stage Renal Disease (ESRD)  General Interventions   General Interventions Discussed/Reviewed General Interventions Reviewed, Doctor Visits  Doctor Visits Discussed/Reviewed Doctor Visits Reviewed, PCP  [PCP visit scheduled 7/30]  PCP/Specialist Visits Compliance with follow-up visit  Exercise Interventions   Exercise Discussed/Reviewed Weight Managment  Weight Management Weight maintenance  Education Interventions   Education Provided Provided Education  Provided Verbal Education On Medication, When to see the doctor, Nutrition  [advised to monitor blood pressure and heart rate daily]  Nutrition Interventions   Nutrition Discussed/Reviewed Nutrition Reviewed, Decreasing salt, Adding fruits and vegetables           COMPLETED: Recover from vascular surgery       Care Coordination Interventions: Evaluation of current treatment plan related to s/p femoral bypass graft and patient's adherence to plan as established by provider Advised patient to keep incision site clean and dry Discussed plans with patient for ongoing care management follow up and provided patient with direct contact information for care management team         SDOH assessments and interventions  completed:  No     Care Coordination Interventions:  Yes, provided   Follow up plan: Follow up call scheduled for 8/22    Encounter Outcome:  Pt. Visit Completed   Kemper Durie, RN, MSN, [facility] Care Management Care Management Coordinator [phone]

## 2023-01-26 NOTE — Progress Notes (Unsigned)
      Established patient visit   Patient: [name] SEJA Sr.   DOB: June 19, 1941   82 y.o. Male  MRN: [id number] Visit Date: 01/27/2023  Today's healthcare provider: Ronnald Ramp, MD   No chief complaint on file.  Subjective      Transition of Care   HTN   Medications: Outpatient Medications Prior to Visit  Medication Sig   amiodarone (PACERONE) 200 MG tablet Take 1 tablet (200 mg total) by mouth daily.   clopidogrel (PLAVIX) 75 MG tablet Take 1 tablet (75 mg total) by mouth daily. (Patient not taking: Reported on 11/20/2022)   fluticasone (FLONASE) 50 MCG/ACT nasal spray Place 2 sprays into both nostrils daily.   gabapentin (NEURONTIN) 300 MG capsule Take 1 capsule (300 mg total) by mouth 2 (two) times daily.   gabapentin (NEURONTIN) 300 MG capsule Take 300 mg by mouth 2 (two) times daily.   levothyroxine (SYNTHROID) 100 MCG tablet TAKE 1 TABLET BY MOUTH DAILY BEFORE BREAKFAST   lisinopril (ZESTRIL) 2.5 MG tablet Take 1 tablet (2.5 mg total) by mouth daily.   lovastatin (MEVACOR) 20 MG tablet TAKE 1 TABLET(20 MG) BY MOUTH DAILY   mupirocin ointment (BACTROBAN) 2 % Apply 1 Application topically daily.   naloxone (NARCAN) nasal spray 4 mg/0.1 mL SMARTSIG:Both Nares   oxyCODONE-acetaminophen (PERCOCET/ROXICET) 5-325 MG tablet Take 1-2 tablets by mouth every 4 (four) hours as needed for moderate pain.   torsemide (DEMADEX) 20 MG tablet TAKE 1 TABLET(20 MG) BY MOUTH TWICE DAILY   traMADol (ULTRAM) 50 MG tablet Take 50 mg by mouth 2 (two) times daily as needed.   XARELTO 20 MG TABS tablet Take 1 tablet (20 mg total) by mouth daily.   No facility-administered medications prior to visit.    Review of Systems  {Insert previous labs (optional):23779}  {See past labs  Heme  Chem  Endocrine  Serology  Results Review (optional):1}   Objective    There were no vitals taken for this visit. {Insert last BP/Wt (optional):23777}  {See vitals history  (optional):1}  Physical Exam  ***  No results found for any visits on 01/27/23.  Assessment & Plan     Problem List Items Addressed This Visit     Hypertension - Primary     No follow-ups on file.         Ronnald Ramp, MD  [facility] [phone] (phone) [phone] (fax)  [facility]

## 2023-01-27 ENCOUNTER — Ambulatory Visit (INDEPENDENT_AMBULATORY_CARE_PROVIDER_SITE_OTHER): Payer: PPO | Admitting: Family Medicine

## 2023-01-27 ENCOUNTER — Encounter: Payer: Self-pay | Admitting: Family Medicine

## 2023-01-27 VITALS — BP 98/30 | HR 72 | Temp 97.8°F | Resp 20 | Ht 74.0 in | Wt 240.0 lb

## 2023-01-27 DIAGNOSIS — G8929 Other chronic pain: Secondary | ICD-10-CM

## 2023-01-27 DIAGNOSIS — E039 Hypothyroidism, unspecified: Secondary | ICD-10-CM

## 2023-01-27 DIAGNOSIS — D631 Anemia in chronic kidney disease: Secondary | ICD-10-CM | POA: Diagnosis not present

## 2023-01-27 DIAGNOSIS — N1832 Chronic kidney disease, stage 3b: Secondary | ICD-10-CM | POA: Diagnosis not present

## 2023-01-27 DIAGNOSIS — I5040 Unspecified combined systolic (congestive) and diastolic (congestive) heart failure: Secondary | ICD-10-CM

## 2023-01-27 DIAGNOSIS — Z0000 Encounter for general adult medical examination without abnormal findings: Secondary | ICD-10-CM

## 2023-01-27 DIAGNOSIS — I10 Essential (primary) hypertension: Secondary | ICD-10-CM | POA: Diagnosis not present

## 2023-01-27 DIAGNOSIS — I4821 Permanent atrial fibrillation: Secondary | ICD-10-CM

## 2023-01-27 DIAGNOSIS — E782 Mixed hyperlipidemia: Secondary | ICD-10-CM

## 2023-01-27 DIAGNOSIS — M533 Sacrococcygeal disorders, not elsewhere classified: Secondary | ICD-10-CM | POA: Diagnosis not present

## 2023-01-27 DIAGNOSIS — I5022 Chronic systolic (congestive) heart failure: Secondary | ICD-10-CM

## 2023-01-27 MED ORDER — LISINOPRIL 2.5 MG PO TABS
2.5000 mg | ORAL_TABLET | Freq: Every day | ORAL | 3 refills | Status: DC
Start: 2023-01-27 — End: 2024-02-14

## 2023-01-27 MED ORDER — AMIODARONE HCL 200 MG PO TABS: 200.0000 mg | ORAL_TABLET | Freq: Every day | ORAL | 3 refills | Status: DC

## 2023-01-27 MED ORDER — LOVASTATIN 20 MG PO TABS
20.0000 mg | ORAL_TABLET | Freq: Every day | ORAL | 3 refills | Status: DC
Start: 2023-01-27 — End: 2023-10-28

## 2023-01-27 MED ORDER — XARELTO 20 MG PO TABS
20.0000 mg | ORAL_TABLET | Freq: Every day | ORAL | 3 refills | Status: DC
Start: 2023-01-27 — End: 2024-02-14

## 2023-01-27 MED ORDER — LEVOTHYROXINE SODIUM 100 MCG PO TABS
ORAL_TABLET | ORAL | 3 refills | Status: DC
Start: 2023-01-27 — End: 2024-02-08

## 2023-01-27 NOTE — Assessment & Plan Note (Signed)
Recent hemoglobin was 7.7, which is low in June 2024 -Chronic, suspect 2/2 to vascular surgery in setting of CKD  -Order CBC to follow up on anemia.

## 2023-01-27 NOTE — Assessment & Plan Note (Signed)
Stable. -Chronic,stable on levothyroxine -Continue Levothyroxine daily.

## 2023-01-27 NOTE — Assessment & Plan Note (Signed)
-  Flu shot in November 2024 during follow-up visit.

## 2023-01-27 NOTE — Assessment & Plan Note (Addendum)
Chronic  Stable  RRR today  Stable on amiodarone 200mg  daily and Xarelto 20mg  daily  Recommended scheduling follow up with cardiology

## 2023-01-27 NOTE — Assessment & Plan Note (Signed)
Stable, with recent lower blood pressures. -chronic, stable  -Continue Lisinopril 2.5mg  daily.

## 2023-01-27 NOTE — Assessment & Plan Note (Signed)
Managed with Gabapentin 300mg  twice daily and Tramadol 50mg  as needed. -Chronic, symptoms well managed on tramadol 50mg  twice daily PRN and gabapentin twice daily  -continue to follow with pain management as scheduled  -Continue current regimen.

## 2023-01-27 NOTE — Assessment & Plan Note (Signed)
Stable, no recent cardiology follow-up. No symptoms of shortness of breath or exertional intolerance. -chronic  -stable, no SOB  -Continue current medications including Xarelto 20mg  daily and Amiodarone 200mg  daily. -Consider cardiology follow-up given it has been over a year since last visit.

## 2023-01-27 NOTE — Assessment & Plan Note (Signed)
Stable, with recent labs checked by nephrology. -Chronic, last creatinine 2.36 in June 2024, GFR 27 -followed by Nephrology -Continue current management with lisinopril 2.5mg  daily

## 2023-01-29 ENCOUNTER — Other Ambulatory Visit: Payer: Self-pay

## 2023-01-29 ENCOUNTER — Other Ambulatory Visit: Payer: Self-pay | Admitting: Family Medicine

## 2023-01-29 ENCOUNTER — Telehealth: Payer: Self-pay

## 2023-01-29 DIAGNOSIS — N1832 Chronic kidney disease, stage 3b: Secondary | ICD-10-CM

## 2023-01-29 MED ORDER — FERROUS SULFATE 325 (65 FE) MG PO TABS
325.0000 mg | ORAL_TABLET | Freq: Every day | ORAL | 3 refills | Status: DC
Start: 2023-01-29 — End: 2024-01-07

## 2023-01-29 NOTE — Telephone Encounter (Signed)
-----   Message from [facility] [name] sent at 01/29/2023 11:49 AM EDT ----- Anemia has worsened, dropped from 9 to 7.9. would recommend rechecking CBC (lab only visit) along with iron panel/ferritin in 6 weeks and start oral iron supplementation (325mg  once daily taken with orange juice followed by 8oz of water)  until next follow up visit in Oct

## 2023-02-19 ENCOUNTER — Ambulatory Visit: Payer: Self-pay | Admitting: *Deleted

## 2023-02-19 NOTE — Patient Outreach (Signed)
  Care Coordination   02/19/2023 Name: [name] CASSELL Sr. MRN: [id number] DOB: 1941-03-15   Care Coordination Outreach Attempts:  An unsuccessful telephone outreach was attempted for a scheduled appointment today.  Follow Up Plan:  Additional outreach attempts will be made to offer the patient care coordination information and services.   Encounter Outcome:  No Answer   Care Coordination Interventions:  No, not indicated    Kemper Durie, RN, MSN, [facility] Care Management Care Management Coordinator [phone]

## 2023-02-20 ENCOUNTER — Telehealth: Payer: Self-pay | Admitting: *Deleted

## 2023-02-20 NOTE — Patient Outreach (Signed)
  Care Coordination   02/20/2023 Name: [name] Sr. MRN: [id number] DOB: [birth year]   Care Coordination Outreach Attempts:  A second unsuccessful outreach was attempted today to offer the patient with information about available care coordination services.  Follow Up Plan:  Additional outreach attempts will be made to offer the patient care coordination information and services.   Encounter Outcome:  No Answer   Care Coordination Interventions:  No, not indicated    Kemper Durie, RN, MSN, [facility] Care Management Care Management Coordinator [phone]

## 2023-02-20 NOTE — Patient Outreach (Signed)
  Care Coordination   Follow Up Visit Note   02/21/2023 Name: [name] CODAY Sr. MRN: [id number] DOB: 1941-05-20  [name] Prom Sr. is a 82 y.o. year old male who sees [name], [name], MD for primary care. I spoke with  [name] Prom Sr. by phone today.  What matters to the patients health and wellness today?  Refill Gabapentin when able to have insurance covered     Goals Addressed             This Visit's Progress    Effective management of chronic medical conditions   On track     Interventions Today    Flowsheet Row Most Recent Value  Chronic Disease   Chronic disease during today's visit Congestive Heart Failure (CHF), Other  [chronic pain]  General Interventions   General Interventions Discussed/Reviewed General Interventions Reviewed, Doctor Visits, Communication with  Doctor Visits Discussed/Reviewed Doctor Visits Reviewed  Communication with Pharmacists  [Calle placed to [facility] to inquire about process for lost prescriptions]  Education Interventions   Education Provided Provided Education  Provided Verbal Education On Medication  [Patient report he lost prescription for Gabapentin after filling it, looking to refill it.  Aware that tihs can't be re-ordered until 9/5 due it being controlled substance and insurance coverage]              SDOH assessments and interventions completed:  No     Care Coordination Interventions:  Yes, provided   Follow up plan: Follow up call scheduled for 9/5    Encounter Outcome:  Pt. Visit Completed   Kemper Durie, RN, MSN, [facility] Care Management Care Management Coordinator [phone]

## 2023-03-05 ENCOUNTER — Ambulatory Visit: Payer: Self-pay | Admitting: *Deleted

## 2023-03-05 NOTE — Patient Outreach (Signed)
  Care Coordination   03/05/2023 Name: [name] SCHRAER Sr. MRN: [id number] DOB: 07-Feb-1941   Care Coordination Outreach Attempts:  An unsuccessful telephone outreach was attempted for a scheduled appointment today.  Follow Up Plan:  Additional outreach attempts will be made to offer the patient care coordination information and services.   Encounter Outcome:  No Answer   Care Coordination Interventions:  No, not indicated    Kemper Durie, RN, MSN, [facility] Care Management Care Management Coordinator [phone]

## 2023-03-06 ENCOUNTER — Telehealth: Payer: Self-pay | Admitting: *Deleted

## 2023-03-06 NOTE — Patient Outreach (Signed)
  Care Coordination   Follow Up Visit Note   03/08/2023 Name: [name] ZIERDEN Sr. MRN: [id number] DOB: [birth year]  [name] Prom Sr. is a 82 y.o. year old male who sees [name], [name], MD for primary care. I spoke with  [name] Prom Sr. by phone today.  What matters to the patients health and wellness today?  Pain management and overall good health and independence    Goals Addressed             This Visit's Progress    Effective management of chronic medical conditions   On track      Interventions Today    Flowsheet Row Most Recent Value  Chronic Disease   Chronic disease during today's visit Congestive Heart Failure (CHF), Other  [chronic pain]  General Interventions   General Interventions Discussed/Reviewed General Interventions Reviewed, Doctor Visits  Doctor Visits Discussed/Reviewed Doctor Visits Reviewed, PCP, Specialist  [facility] 9/16, Nephrology 9/24, Pain 10/15, PCP 10/30]  PCP/Specialist Visits Compliance with follow-up visit  Exercise Interventions   Exercise Discussed/Reviewed Weight Managment  Weight Management Weight maintenance  Education Interventions   Education Provided Provided Education  Provided Verbal Education On Medication, When to see the doctor  [Confirmed patient has obtained new refill for gabapentin]              SDOH assessments and interventions completed:  No     Care Coordination Interventions:  Yes, provided   Follow up plan: Follow up call scheduled for 11/5    Encounter Outcome:  Patient Visit Completed   Kemper Durie, RN, MSN, [facility] Care Management Care Management Coordinator [phone]

## 2023-03-11 ENCOUNTER — Other Ambulatory Visit (INDEPENDENT_AMBULATORY_CARE_PROVIDER_SITE_OTHER): Payer: Self-pay | Admitting: Vascular Surgery

## 2023-03-11 DIAGNOSIS — I7025 Atherosclerosis of native arteries of other extremities with ulceration: Secondary | ICD-10-CM

## 2023-03-15 ENCOUNTER — Emergency Department: Payer: PPO

## 2023-03-15 ENCOUNTER — Emergency Department
Admission: EM | Admit: 2023-03-15 | Discharge: 2023-03-15 | Disposition: A | Discharge status: Home / Self Care | Payer: PPO | Attending: Emergency Medicine | Admitting: Emergency Medicine

## 2023-03-15 ENCOUNTER — Other Ambulatory Visit: Payer: Self-pay

## 2023-03-15 ENCOUNTER — Encounter: Payer: Self-pay | Admitting: Emergency Medicine

## 2023-03-15 DIAGNOSIS — S90122A Contusion of left lesser toe(s) without damage to nail, initial encounter: Secondary | ICD-10-CM

## 2023-03-15 DIAGNOSIS — S91115A Laceration without foreign body of left lesser toe(s) without damage to nail, initial encounter: Secondary | ICD-10-CM

## 2023-03-15 DIAGNOSIS — S91312A Laceration without foreign body, left foot, initial encounter: Secondary | ICD-10-CM | POA: Diagnosis not present

## 2023-03-15 DIAGNOSIS — S90922A Unspecified superficial injury of left foot, initial encounter: Secondary | ICD-10-CM | POA: Diagnosis present

## 2023-03-15 DIAGNOSIS — Z23 Encounter for immunization: Secondary | ICD-10-CM | POA: Diagnosis not present

## 2023-03-15 DIAGNOSIS — S9032XA Contusion of left foot, initial encounter: Secondary | ICD-10-CM | POA: Diagnosis not present

## 2023-03-15 DIAGNOSIS — Z7901 Long term (current) use of anticoagulants: Secondary | ICD-10-CM | POA: Diagnosis not present

## 2023-03-15 DIAGNOSIS — Y92009 Unspecified place in unspecified non-institutional (private) residence as the place of occurrence of the external cause: Secondary | ICD-10-CM | POA: Insufficient documentation

## 2023-03-15 DIAGNOSIS — W228XXA Striking against or struck by other objects, initial encounter: Secondary | ICD-10-CM | POA: Insufficient documentation

## 2023-03-15 DIAGNOSIS — Z7902 Long term (current) use of antithrombotics/antiplatelets: Secondary | ICD-10-CM | POA: Insufficient documentation

## 2023-03-15 MED ORDER — TETANUS-DIPHTH-ACELL PERTUSSIS 5-2.5-18.5 LF-MCG/0.5 IM SUSY
0.5000 mL | PREFILLED_SYRINGE | Freq: Once | INTRAMUSCULAR | Status: AC
  Administered 2023-03-15: 0.5 mL via INTRAMUSCULAR
  Filled 2023-03-15: qty 0.5

## 2023-03-15 NOTE — ED Provider Notes (Signed)
[facility] Provider Note    Event Date/Time   First MD Initiated Contact with Patient 03/15/23 [phone]     (approximate)   History   No chief complaint on file.   HPI  [name] FEULNER Sr. is a 82 y.o. male presents to the ED with injury to his left third toe when he bumped into the post of his walker.  Patient currently is on Plavix and Xarelto and states that he could not get the area to stop bleeding.     Physical Exam   Triage Vital Signs: ED Triage Vitals  Encounter Vitals Group     BP 03/15/23 0842 (!) 135/52     Systolic BP Percentile --      Diastolic BP Percentile --      Pulse Rate 03/15/23 0842 61     Resp 03/15/23 0842 18     Temp 03/15/23 0842 98.1 F (36.7 C)     Temp Source 03/15/23 0842 Oral     SpO2 03/15/23 0842 98 %     Weight 03/15/23 0840 240 lb (108.9 kg)     Height 03/15/23 0840 6\' 2"  (1.88 m)     Head Circumference --      Peak Flow --      Pain Score 03/15/23 0840 0     Pain Loc --      Pain Education --      Exclude from Growth Chart --     Most recent vital signs: Vitals:   03/15/23 0842  BP: (!) 135/52  Pulse: 61  Resp: 18  Temp: 98.1 F (36.7 C)  SpO2: 98%     General: Awake, no distress.  CV:  Good peripheral perfusion.  Resp:  Normal effort.  Abd:  No distention.  Other:  Left third digit there is a 0.2 cm or less laceration at the distal aspect of the digit in the area of the nail.  Nail is thick but intact.  Consistent with a fungal infection.  No active bleeding at this time.   ED Results / Procedures / Treatments   Labs (all labs ordered are listed, but only abnormal results are displayed) Labs Reviewed - No data to display   RADIOLOGY Left foot x-ray images were reviewed by myself independent of the radiologist and was negative for fracture or dislocation.    PROCEDURES:  Critical Care performed:   Procedures   MEDICATIONS ORDERED IN ED: Medications  Tdap (BOOSTRIX) injection  0.5 mL (0.5 mLs Intramuscular Given 03/15/23 1053)     IMPRESSION / MDM / ASSESSMENT AND PLAN / ED COURSE  I reviewed the triage vital signs and the nursing notes.   Differential diagnosis includes, but is not limited to, fracture left third digit, contusion left foot, dislocation, nail avulsion, laceration.  82 year old male presents to the ED with injury to his left third toe when he hit it against his walker this morning.  Because he is on Plavix and Xarelto he was unable to get the area to stop.  A pressure dressing was applied in triage and when dressing was removed there was no active bleeding.  This was watched for period time and no active bleeding continued.  A surgigel dressing was applied and patient was given directions to follow-up with his primary care provider if any continued problems.      Patient's presentation is most consistent with acute complicated illness / injury requiring diagnostic workup.  FINAL CLINICAL IMPRESSION(S) / ED  DIAGNOSES   Final diagnoses:  Laceration of middle toe, left, initial encounter  Contusion of middle toe, left, initial encounter     Rx / DC Orders   ED Discharge Orders     None        Note:  This document was prepared using Dragon voice recognition software and may include unintentional dictation errors.   Tommi Rumps, PA-C 03/15/23 1106    Jene Every, MD 03/15/23 1140

## 2023-03-15 NOTE — Discharge Instructions (Signed)
Follow-up with your primary care provider if any continued problems or concerns.  Elevate foot and apply pressure if needed to control bleeding.  Leave the dressing that was placed on your foot today for the next 2 days and then you may remove this, clean with mild soap and water and apply a dressing.

## 2023-03-15 NOTE — ED Triage Notes (Signed)
Pt via POV from home. Pt stumped his L middle toe on this walker causing the area under the toenail to start bleeding. Pt is on Xarelto and Plavix. Denies pain. No obvious evidence of infection. Minimal bleeding at this time. This RN wrapped toe with gauze and coban at this time. Pt is A&Ox4 and NAD

## 2023-03-16 ENCOUNTER — Encounter (INDEPENDENT_AMBULATORY_CARE_PROVIDER_SITE_OTHER): Payer: PPO

## 2023-03-16 ENCOUNTER — Ambulatory Visit (INDEPENDENT_AMBULATORY_CARE_PROVIDER_SITE_OTHER): Payer: PPO | Admitting: Vascular Surgery

## 2023-03-24 DIAGNOSIS — R609 Edema, unspecified: Secondary | ICD-10-CM | POA: Diagnosis not present

## 2023-03-24 DIAGNOSIS — I10 Essential (primary) hypertension: Secondary | ICD-10-CM | POA: Diagnosis not present

## 2023-03-24 DIAGNOSIS — I4821 Permanent atrial fibrillation: Secondary | ICD-10-CM | POA: Diagnosis not present

## 2023-03-24 DIAGNOSIS — N1832 Chronic kidney disease, stage 3b: Secondary | ICD-10-CM | POA: Diagnosis not present

## 2023-03-24 DIAGNOSIS — E785 Hyperlipidemia, unspecified: Secondary | ICD-10-CM | POA: Diagnosis not present

## 2023-03-24 DIAGNOSIS — D631 Anemia in chronic kidney disease: Secondary | ICD-10-CM | POA: Diagnosis not present

## 2023-03-24 DIAGNOSIS — I509 Heart failure, unspecified: Secondary | ICD-10-CM | POA: Diagnosis not present

## 2023-04-14 DIAGNOSIS — M48061 Spinal stenosis, lumbar region without neurogenic claudication: Secondary | ICD-10-CM | POA: Diagnosis not present

## 2023-04-14 DIAGNOSIS — M5136 Other intervertebral disc degeneration, lumbar region with discogenic back pain only: Secondary | ICD-10-CM | POA: Diagnosis not present

## 2023-04-14 DIAGNOSIS — M5416 Radiculopathy, lumbar region: Secondary | ICD-10-CM | POA: Diagnosis not present

## 2023-04-15 ENCOUNTER — Ambulatory Visit (INDEPENDENT_AMBULATORY_CARE_PROVIDER_SITE_OTHER): Payer: PPO

## 2023-04-15 DIAGNOSIS — Z23 Encounter for immunization: Secondary | ICD-10-CM | POA: Diagnosis not present

## 2023-04-16 ENCOUNTER — Telehealth: Payer: Self-pay

## 2023-04-16 NOTE — Telephone Encounter (Signed)
Transition Care Management Unsuccessful Follow-up Telephone Call  Date of discharge and from where:  03/15/2023 [facility]  Attempts:  1st Attempt  Reason for unsuccessful TCM follow-up call:  No answer/busy  Juergen Hardenbrook [facility]  [facility], [facility] Guide Direct Dial: [phone]  Website: [email or web address]

## 2023-04-17 ENCOUNTER — Telehealth: Payer: Self-pay

## 2023-04-17 NOTE — Telephone Encounter (Signed)
Transition Care Management Unsuccessful Follow-up Telephone Call  Date of discharge and from where:  03/15/2023 [facility]  Attempts:  2nd Attempt  Reason for unsuccessful TCM follow-up call:  No answer/busy  Sherrel Shafer [facility]  [facility], [facility] Guide Direct Dial: [phone]  Website: [email or web address]

## 2023-04-20 ENCOUNTER — Ambulatory Visit (INDEPENDENT_AMBULATORY_CARE_PROVIDER_SITE_OTHER): Payer: PPO | Admitting: Vascular Surgery

## 2023-04-20 ENCOUNTER — Encounter (INDEPENDENT_AMBULATORY_CARE_PROVIDER_SITE_OTHER): Payer: Self-pay | Admitting: Vascular Surgery

## 2023-04-20 ENCOUNTER — Ambulatory Visit (INDEPENDENT_AMBULATORY_CARE_PROVIDER_SITE_OTHER): Payer: PPO

## 2023-04-20 VITALS — BP 101/41 | HR 57 | Resp 16

## 2023-04-20 DIAGNOSIS — I10 Essential (primary) hypertension: Secondary | ICD-10-CM | POA: Diagnosis not present

## 2023-04-20 DIAGNOSIS — I890 Lymphedema, not elsewhere classified: Secondary | ICD-10-CM

## 2023-04-20 DIAGNOSIS — N184 Chronic kidney disease, stage 4 (severe): Secondary | ICD-10-CM

## 2023-04-20 DIAGNOSIS — I7025 Atherosclerosis of native arteries of other extremities with ulceration: Secondary | ICD-10-CM | POA: Diagnosis not present

## 2023-04-20 DIAGNOSIS — I70213 Atherosclerosis of native arteries of extremities with intermittent claudication, bilateral legs: Secondary | ICD-10-CM | POA: Diagnosis not present

## 2023-04-20 DIAGNOSIS — I4821 Permanent atrial fibrillation: Secondary | ICD-10-CM

## 2023-04-23 ENCOUNTER — Encounter (INDEPENDENT_AMBULATORY_CARE_PROVIDER_SITE_OTHER): Payer: Self-pay | Admitting: Vascular Surgery

## 2023-04-23 DIAGNOSIS — I70219 Atherosclerosis of native arteries of extremities with intermittent claudication, unspecified extremity: Secondary | ICD-10-CM | POA: Insufficient documentation

## 2023-04-23 DIAGNOSIS — I890 Lymphedema, not elsewhere classified: Secondary | ICD-10-CM | POA: Insufficient documentation

## 2023-04-23 NOTE — Progress Notes (Signed)
MRN : [id number]  [name] CHEATOM Sr. is a 82 y.o. (1940/12/07) male who presents with chief complaint of check circulation.  History of Present Illness:   The patient returns to the office for followup and review status post bypass surgery on October 22, 2022.   Procedure: left common femoral artery to left posterior tibial tibial artery bypass with 6 mm distal flow graft Left profunda femoris endarterectomy endarterectomy  The patient notes improvement in the lower extremity symptoms except that he notes that he is now swelling massively. No interval shortening of the patient's claudication distance or rest pain symptoms.  However, is now having a new discomfort which is a tightness almost like his skin is being stretched that correlates with his edema.  He notes that in the morning his leg is significantly better but as the course of the day goes on he swells and becomes much more symptomatic.  He has been elevating at home.  He continues to do walking exercise but the symptoms of swelling seem to have gotten worse.  No new ulcers or wounds have occurred since the last visit.  There have been no significant changes to the patient's overall health care.  No documented history of amaurosis fugax or recent TIA symptoms. There are no recent neurological changes noted. No documented history of DVT, PE or superficial thrombophlebitis. The patient denies recent episodes of angina or shortness of breath.   ABI's Rt=0.82 and Lt=0.90  (previous ABI's Rt=1.08 and Lt=1.17) Duplex US of the bilateral lower extremity arterial system shows the right lower extremity is patent there is a moderate to severe mid SFA lesion of 50 to 70%.  There appears to be three-vessel runoff to the foot.  On the left is bypass remains patent with uniform velocities.  Again noted is the seroma continues groin  Current Meds  Medication Sig   amiodarone  (PACERONE) 200 MG tablet Take 1 tablet (200 mg total) by mouth daily.   clopidogrel (PLAVIX) 75 MG tablet Take 1 tablet (75 mg total) by mouth daily.   ferrous sulfate 325 (65 FE) MG tablet Take 1 tablet (325 mg total) by mouth daily with breakfast.   fluticasone (FLONASE) 50 MCG/ACT nasal spray Place 2 sprays into both nostrils daily.   gabapentin (NEURONTIN) 300 MG capsule Take 1 capsule (300 mg total) by mouth 2 (two) times daily.   levothyroxine (SYNTHROID) 100 MCG tablet TAKE 1 TABLET BY MOUTH DAILY BEFORE BREAKFAST   lisinopril (ZESTRIL) 2.5 MG tablet Take 1 tablet (2.5 mg total) by mouth daily.   lovastatin (MEVACOR) 20 MG tablet Take 1 tablet (20 mg total) by mouth at bedtime.   mupirocin ointment (BACTROBAN) 2 % Apply 1 Application topically daily.   naloxone (NARCAN) nasal spray 4 mg/0.1 mL SMARTSIG:Both Nares   torsemide (DEMADEX) 20 MG tablet TAKE 1 TABLET(20 MG) BY MOUTH TWICE DAILY   traMADol (ULTRAM) 50 MG tablet Take 50 mg by mouth 2 (two) times daily as needed.   XARELTO 20 MG TABS tablet Take 1 tablet (20 mg total) by mouth daily.    Past Medical  History:  Diagnosis Date   A-fib [facility])    a.) CHA2DS2VASc = 5 (age x2, CHF, HTN, vascular disease history);  b.) rate/rhythm maintained on oral amiodarone; chronically anticoagulated with rivaroxaban   Allergy    Arthritis    CAD (coronary artery disease) 11/17/2012   a.) LHC 11/17/2012: 30% mLM, 30% mLAD, 30% mLCx, 50%/30% OM1, 30% pRCA - med mgmt   Chronic systolic CHF (congestive heart failure), NYHA class 2 (HCC)    a.) TTE 05/30/2015: EF 40%, glob HK, mild LVH, mild LAE, mild MR/TR; b.) TTE 10/14/2017: EF 40%, glob HK, mild LVH, mod BAE, triv PR, mild MR/TR, G1DD   Complication of anesthesia    a.) hypotension during routine colonoscopy   COPD (chronic obstructive pulmonary disease) (HCC)    DDD (degenerative disc disease), lumbar    Dilated cardiomyopathy (HCC)    a.) LHC 11/17/2012: EF 35%; b.) TTE 05/30/2015: EF 40%;  c.) TTE 10/14/2017: EF 40%   GERD (gastroesophageal reflux disease)    History of chicken pox    Hyperlipidemia    Hypertension    Hypothyroidism    Long term current use of amiodarone    Long term current use of anticoagulant    a.) Rivaroxaban   Osteoporosis    Peripheral vascular disease (HCC)    Pre-diabetes    Spinal stenosis of lumbar region without neurogenic claudication    Stage 3b chronic kidney disease (HCC)     Past Surgical History:  Procedure Laterality Date   BACK SURGERY     x5   DOPPLER ECHOCARDIOGRAPHY  06/21/2013   EF=35% while in Sinus Rhythm   ENDARTERECTOMY FEMORAL Left 10/22/2022   Procedure: ENDARTERECTOMY FEMORAL;  Surgeon: Renford Dills, MD;  Location: [facility] ORS;  Service: Vascular;  Laterality: Left;   FEMORAL-TIBIAL BYPASS GRAFT Left 10/22/2022   Procedure: BYPASS GRAFT FEMORAL-TIBIAL ARTERY;  Surgeon: Renford Dills, MD;  Location: [facility] ORS;  Service: Vascular;  Laterality: Left;   LEFT HEART CATH AND CORONARY ANGIOGRAPHY Left 11/17/2012   Procedure: LEFT HEART CATHETERIZATION AND CORONARY ANGIOGRAPHY; Location: [facility]; Surgeon: Marcina Millard, MD   LOWER EXTREMITY ANGIOGRAPHY Right 05/20/2022   Procedure: Lower Extremity Angiography;  Surgeon: Renford Dills, MD;  Location: [facility] INVASIVE CV LAB;  Service: Cardiovascular;  Laterality: Right;   LOWER EXTREMITY ANGIOGRAPHY Left 07/15/2022   Procedure: Lower Extremity Angiography;  Surgeon: Renford Dills, MD;  Location: [facility] INVASIVE CV LAB;  Service: Cardiovascular;  Laterality: Left;   Myocardial perfusion scan  10/28/2012   severe global LV enlargement. Mild RV enlargement. Mild LVH. Mild mitral and tricuspid insufficency   polyp excision  09/28/2005   UPPER GASTROINTESTINAL ENDOSCOPY  03/27/2004   Gastropathy, Gastritis; Duodenopathy    Social History Social History   Tobacco Use   Smoking status: Former    Current packs/day: 0.00    Average packs/day: 2.0 packs/day for 50.0  years (100.0 ttl pk-yrs)    Types: Cigarettes    Start date: 06/30/1957    Quit date: 07/01/2007    Years since quitting: 15.8   Smokeless tobacco: Never  Vaping Use   Vaping status: Never Used  Substance Use Topics   Alcohol use: No    Alcohol/week: 0.0 standard drinks of alcohol   Drug use: No    Family History Family History  Problem Relation Age of Onset   Alzheimer's disease Mother    Heart attack Father    Colon cancer Brother     No Known Allergies  REVIEW OF SYSTEMS (Negative unless checked)  Constitutional: [] Weight loss  [] Fever  [] Chills Cardiac: [] Chest pain   [] Chest pressure   [] Palpitations   [] Shortness of breath when laying flat   [] Shortness of breath with exertion. Vascular:  [x] Pain in legs with walking   [] Pain in legs at rest  [] History of DVT   [] Phlebitis   [] Swelling in legs   [] Varicose veins   [] Non-healing ulcers Pulmonary:   [] Uses home oxygen   [] Productive cough   [] Hemoptysis   [] Wheeze  [] COPD   [] Asthma Neurologic:  [] Dizziness   [] Seizures   [] History of stroke   [] History of TIA  [] Aphasia   [] Vissual changes   [] Weakness or numbness in arm   [] Weakness or numbness in leg Musculoskeletal:   [] Joint swelling   [] Joint pain   [] Low back pain Hematologic:  [] Easy bruising  [] Easy bleeding   [] Hypercoagulable state   [] Anemic Gastrointestinal:  [] Diarrhea   [] Vomiting  [] Gastroesophageal reflux/heartburn   [] Difficulty swallowing. Genitourinary:  [] Chronic kidney disease   [] Difficult urination  [] Frequent urination   [] Blood in urine Skin:  [] Rashes   [] Ulcers  Psychological:  [] History of anxiety   []  History of major depression.  Physical Examination  Vitals:   04/20/23 1530  BP: (!) 101/41  Pulse: (!) 57  Resp: 16   There is no height or weight on file to calculate BMI. Gen: WD/WN, NAD Head: [place]/AT, No temporalis wasting.  Ear/Nose/Throat: Hearing grossly intact, nares w/o erythema or drainage Eyes: PER, EOMI, sclera nonicteric.   Neck: Supple, no masses.  No bruit or JVD.  Pulmonary:  Good air movement, no audible wheezing, no use of accessory muscles.  Cardiac: RRR, normal S1, S2, no Murmurs. Vascular:  mild trophic changes, no open wounds; Moderate to severe venous stasis changes to the legs bilaterally.  4+ hard pitting edema left lower extremity, CEAP C4sEpAsPr Vessel Right Left  Radial Palpable Palpable  PT Not Palpable Not Palpable  DP Not Palpable Not Palpable  Gastrointestinal: soft, non-distended. No guarding/no peritoneal signs.  Musculoskeletal: M/S 5/5 throughout.  No visible deformity.  Neurologic: CN 2-12 intact. Pain and light touch intact in extremities.  Symmetrical.  Speech is fluent. Motor exam as listed above. Psychiatric: Judgment intact, Mood & affect appropriate for pt's clinical situation. Dermatologic: No rashes or ulcers noted.  No changes consistent with cellulitis.   CBC Lab Results  Component Value Date   WBC 9.2 01/27/2023   HGB 7.9 (L) 01/27/2023   HCT 25.9 (L) 01/27/2023   MCV 83 01/27/2023   PLT 265 01/27/2023    BMET    Component Value Date/Time   NA 133 (L) 10/26/2022 0853   NA 140 01/07/2022 0925   K 3.9 10/26/2022 0853   CL 105 10/26/2022 0853   CO2 22 10/26/2022 0853   GLUCOSE 115 (H) 10/26/2022 0853   BUN 35 (H) 10/26/2022 0853   BUN 32 (H) 01/07/2022 0925   CREATININE 1.66 (H) 10/26/2022 0853   CREATININE 1.71 (H) 03/19/2017 0813   CALCIUM 8.4 (L) 10/26/2022 0853   GFRNONAA 41 (L) 10/26/2022 0853   GFRNONAA 39 (L) 03/19/2017 0813   GFRAA 39 (L) 04/04/2020 0808   GFRAA 45 (L) 03/19/2017 0813   CrCl cannot be calculated (Patient's most recent lab result is older than the maximum 21 days allowed.).  COAG Lab Results  Component Value Date   INR 1.4 (H) 11/26/2021    Radiology No results found.   Assessment/Plan 1. Atherosclerosis of native  artery of both lower extremities with intermittent claudication (HCC) Recommend:  The patient is status  post successful bypass surgery and has healed his wounds.  The patient reports that the claudication symptoms and leg pain has improved but not eliminated.   The patient denies lifestyle limiting changes at this point in time.  ABI's Rt=0.82 and Lt=0.90  (previous ABI's Rt=1.08 and Lt=1.17) Duplex US of the bilateral lower extremity arterial system shows the right lower extremity is patent there is a moderate to severe mid SFA lesion of 50 to 70%.  There appears to be three-vessel runoff to the foot.  On the left is bypass remains patent with uniform velocities.  Again noted is the seroma continues groin  No further invasive studies, angiography or surgery at this time. The patient should continue walking and begin a more formal exercise program.  The patient should continue antiplatelet therapy and aggressive treatment of the lipid abnormalities  Continued surveillance is indicated as atherosclerosis is likely to progress with time.    Patient should undergo noninvasive studies as ordered. The patient will follow up with me to review the studies.  - VAS [place] LOWER EXTREMITY ARTERIAL DUPLEX; Future - VAS [place] ABI WITH/WO TBI; Future  2. Lymphedema Recommend:  No surgery or intervention at this point in time.   The Patient is CEAP C4sEpAsPr.  The patient has been wearing compression for more than 12 weeks with no or little benefit.  The patient has been exercising daily for more than 12 weeks. The patient has been elevating and taking OTC pain medications for more than 12 weeks.  None of these have have eliminated the pain related to the lymphedema or the discomfort regarding excessive swelling and venous congestion.    I have reviewed my discussion with the patient regarding lymphedema and why it  causes symptoms.  Patient will continue wearing graduated compression on a daily basis. The patient should put the compression on first thing in the morning and removing them in the evening. The patient  should not sleep in the compression.   In addition, behavioral modification throughout the day will be continued.  This will include frequent elevation (such as in a recliner), use of over the counter pain medications as needed and exercise such as walking.  The systemic causes for chronic edema such as liver, kidney and cardiac etiologies do not appear to have significant changed over the past year.    The patient has chronic , severe lymphedema with hyperpigmentation of the skin and has done MLD, skin care, medication, diet, exercise, elevation and compression for 4 weeks with no improvement,  I am recommending a lymphedema pump.  The patient still has stage 3 lymphedema and therefore, I believe that a lymph pump is needed to improve the control of the patient's lymphedema and improve the quality of life.  Additionally, a lymph pump is warranted because it will reduce the risk of cellulitis and ulceration in the future.  Patient should follow-up in six months   3. Primary hypertension Continue antihypertensive medications as already ordered, these medications have been reviewed and there are no changes at this time.  4. Permanent atrial fibrillation (HCC) Continue antiarrhythmia medications as already ordered, these medications have been reviewed and there are no changes at this time.  Continue anticoagulation as ordered by Cardiology Service  5. Stage 4 chronic kidney disease (HCC) The patient has advanced renal disease.  However, at the present time the patient is not yet on dialysis.  Avoid nephrotoxic medications and dehydration.  Further plans per nephrology   Levora Dredge, MD  04/23/2023 10:36 AM

## 2023-04-27 LAB — VAS US ABI WITH/WO TBI
Left ABI: 0.9
Right ABI: 0.82

## 2023-04-29 ENCOUNTER — Encounter: Payer: Self-pay | Admitting: Family Medicine

## 2023-04-29 ENCOUNTER — Ambulatory Visit (INDEPENDENT_AMBULATORY_CARE_PROVIDER_SITE_OTHER): Payer: PPO | Admitting: Family Medicine

## 2023-04-29 VITALS — BP 109/34 | HR 32 | Temp 97.9°F | Resp 14 | Ht 74.0 in | Wt 250.0 lb

## 2023-04-29 DIAGNOSIS — I70213 Atherosclerosis of native arteries of extremities with intermittent claudication, bilateral legs: Secondary | ICD-10-CM

## 2023-04-29 DIAGNOSIS — I4821 Permanent atrial fibrillation: Secondary | ICD-10-CM

## 2023-04-29 DIAGNOSIS — I5022 Chronic systolic (congestive) heart failure: Secondary | ICD-10-CM

## 2023-04-29 DIAGNOSIS — N184 Chronic kidney disease, stage 4 (severe): Secondary | ICD-10-CM | POA: Diagnosis not present

## 2023-04-29 DIAGNOSIS — I5040 Unspecified combined systolic (congestive) and diastolic (congestive) heart failure: Secondary | ICD-10-CM | POA: Diagnosis not present

## 2023-04-29 DIAGNOSIS — E039 Hypothyroidism, unspecified: Secondary | ICD-10-CM

## 2023-04-29 DIAGNOSIS — M5416 Radiculopathy, lumbar region: Secondary | ICD-10-CM | POA: Diagnosis not present

## 2023-04-29 DIAGNOSIS — I420 Dilated cardiomyopathy: Secondary | ICD-10-CM | POA: Diagnosis not present

## 2023-04-29 DIAGNOSIS — I890 Lymphedema, not elsewhere classified: Secondary | ICD-10-CM

## 2023-04-29 DIAGNOSIS — I10 Essential (primary) hypertension: Secondary | ICD-10-CM

## 2023-04-29 DIAGNOSIS — Z9989 Dependence on other enabling machines and devices: Secondary | ICD-10-CM

## 2023-04-29 MED ORDER — TORSEMIDE 20 MG PO TABS
20.0000 mg | ORAL_TABLET | Freq: Two times a day (BID) | ORAL | 2 refills | Status: DC
Start: 2023-04-29 — End: 2024-02-14

## 2023-04-29 NOTE — Progress Notes (Unsigned)
Established patient visit   Patient: [name] MEIER Sr.   DOB: 1940-12-19   82 y.o. Male  MRN: [id number] Visit Date: 04/29/2023  Today's healthcare provider: Ronnald Ramp, MD   No chief complaint on file.  Subjective       Discussed the use of AI scribe software for clinical note transcription with the patient, who gave verbal consent to proceed.  History of Present Illness             Past Medical History:  Diagnosis Date   A-fib [facility])    a.) CHA2DS2VASc = 5 (age x2, CHF, HTN, vascular disease history);  b.) rate/rhythm maintained on oral amiodarone; chronically anticoagulated with rivaroxaban   Allergy    Arthritis    CAD (coronary artery disease) 11/17/2012   a.) LHC 11/17/2012: 30% mLM, 30% mLAD, 30% mLCx, 50%/30% OM1, 30% pRCA - med mgmt   Chronic systolic CHF (congestive heart failure), NYHA class 2 (HCC)    a.) TTE 05/30/2015: EF 40%, glob HK, mild LVH, mild LAE, mild MR/TR; b.) TTE 10/14/2017: EF 40%, glob HK, mild LVH, mod BAE, triv PR, mild MR/TR, G1DD   Complication of anesthesia    a.) hypotension during routine colonoscopy   COPD (chronic obstructive pulmonary disease) (HCC)    DDD (degenerative disc disease), lumbar    Dilated cardiomyopathy (HCC)    a.) LHC 11/17/2012: EF 35%; b.) TTE 05/30/2015: EF 40%; c.) TTE 10/14/2017: EF 40%   GERD (gastroesophageal reflux disease)    History of chicken pox    Hyperlipidemia    Hypertension    Hypothyroidism    Long term current use of amiodarone    Long term current use of anticoagulant    a.) Rivaroxaban   Osteoporosis    Peripheral vascular disease (HCC)    Pre-diabetes    Spinal stenosis of lumbar region without neurogenic claudication    Stage 3b chronic kidney disease (HCC)     Medications: Outpatient Medications Prior to Visit  Medication Sig   amiodarone (PACERONE) 200 MG tablet Take 1 tablet (200 mg total) by mouth daily.   clopidogrel (PLAVIX) 75 MG tablet Take 1 tablet (75  mg total) by mouth daily.   ferrous sulfate 325 (65 FE) MG tablet Take 1 tablet (325 mg total) by mouth daily with breakfast.   fluticasone (FLONASE) 50 MCG/ACT nasal spray Place 2 sprays into both nostrils daily.   gabapentin (NEURONTIN) 300 MG capsule Take 1 capsule (300 mg total) by mouth 2 (two) times daily.   levothyroxine (SYNTHROID) 100 MCG tablet TAKE 1 TABLET BY MOUTH DAILY BEFORE BREAKFAST   lisinopril (ZESTRIL) 2.5 MG tablet Take 1 tablet (2.5 mg total) by mouth daily.   lovastatin (MEVACOR) 20 MG tablet Take 1 tablet (20 mg total) by mouth at bedtime.   mupirocin ointment (BACTROBAN) 2 % Apply 1 Application topically daily.   naloxone (NARCAN) nasal spray 4 mg/0.1 mL SMARTSIG:Both Nares   torsemide (DEMADEX) 20 MG tablet TAKE 1 TABLET(20 MG) BY MOUTH TWICE DAILY   traMADol (ULTRAM) 50 MG tablet Take 50 mg by mouth 2 (two) times daily as needed.   XARELTO 20 MG TABS tablet Take 1 tablet (20 mg total) by mouth daily.   No facility-administered medications prior to visit.    Review of Systems  {Insert previous labs (optional):23779} {See past labs  Heme  Chem  Endocrine  Serology  Results Review (optional):1}   Objective    There were no vitals taken  for this visit. {Insert last BP/Wt (optional):23777}{See vitals history (optional):1}    Wt Readings from Last 3 Encounters:  03/15/23 240 lb (108.9 kg)  01/27/23 240 lb (108.9 kg)  11/20/22 251 lb (113.9 kg)   BP Readings from Last 3 Encounters:  04/20/23 (!) 101/41  03/15/23 (!) 135/52  01/27/23 (!) 98/30     Physical Exam  ***  No results found for any visits on 04/29/23.  Assessment & Plan     Problem List Items Addressed This Visit   None   Assessment and Plan              No follow-ups on file.         Ronnald Ramp, MD  [facility] [phone] (phone) [phone] (fax)  [facility]

## 2023-04-29 NOTE — Assessment & Plan Note (Signed)
Stable, with recent lower blood pressures. -chronic, stable  -Continue Lisinopril 2.5mg  daily.

## 2023-04-29 NOTE — Assessment & Plan Note (Signed)
Stable on Gabapentin 300mg  twice daily. No new symptoms reported. -Continue Gabapentin 300mg  twice daily. Managed with Tramadol 50mg  as needed. No new symptoms reported. -Continue Tramadol 50mg  as needed.

## 2023-04-29 NOTE — Assessment & Plan Note (Signed)
chronic Stable on Torsemide 20mg  daily. No new symptoms reported. No abnormal lung sounds on exam. -Continue Torsemide 20mg  daily.

## 2023-04-29 NOTE — Assessment & Plan Note (Signed)
Chronic,stable  Recommend follow up with cardiology

## 2023-04-29 NOTE — Assessment & Plan Note (Signed)
Atherosclerosis Stable on Plavix 75mg  daily and Lovastatin 20mg  daily. Regular follow-up with vascular surgery. -Continue Plavix 75mg  daily and Lovastatin 20mg  daily. -follows with vascular surgery, continue follow up as scheduled

## 2023-04-29 NOTE — Assessment & Plan Note (Signed)
Chronic Stable on Synthroid daily. No enlargement or nodules noted on thyroid exam. On amiodarone for Afib -Continue Synthroid daily. -Order thyroid function tests today.

## 2023-04-29 NOTE — Assessment & Plan Note (Signed)
Chronic Patient self-managing with compression bandages. Vascular surgery to order compression machine. -Continue self-management with compression bandages. -continue follow up with vascular surgery and wound care

## 2023-04-29 NOTE — Assessment & Plan Note (Signed)
Stable on Lisinopril 2.5mg  daily. No new symptoms reported. -Continue Lisinopril 2.5mg  daily. -Continue regular follow-up with nephrology. -CMP ordered today

## 2023-04-29 NOTE — Assessment & Plan Note (Signed)
Chronic Stable on Amiodarone 200mg  daily. No reported palpitations or chest pain. Irregular rhythm noted on exam. -Continue Amiodarone 200mg  daily. -continue torsemide 20mg  BID  -Continue Xarelto 20mg  daily -CMP ordered today  -Recommend annual cardiology follow-up. Pt will schedule this appt with Dr. Cassie Freer

## 2023-04-30 LAB — COMPREHENSIVE METABOLIC PANEL
ALT: 14 [IU]/L (ref 0–44)
AST: 23 [IU]/L (ref 0–40)
Albumin: 3.7 g/dL — ABNORMAL LOW (ref 3.8–4.8)
Alkaline Phosphatase: 121 [IU]/L (ref 44–121)
BUN/Creatinine Ratio: 12 (ref 10–24)
BUN: 30 mg/dL — ABNORMAL HIGH (ref 8–27)
Bilirubin Total: 0.5 mg/dL (ref 0.0–1.2)
CO2: 24 mmol/L (ref 20–29)
Calcium: 8.5 mg/dL — ABNORMAL LOW (ref 8.6–10.2)
Chloride: 103 mmol/L (ref 96–106)
Creatinine, Ser: 2.57 mg/dL — ABNORMAL HIGH (ref 0.76–1.27)
Globulin, Total: 2.5 g/dL (ref 1.5–4.5)
Glucose: 113 mg/dL — ABNORMAL HIGH (ref 70–99)
Potassium: 4.7 mmol/L (ref 3.5–5.2)
Sodium: 142 mmol/L (ref 134–144)
Total Protein: 6.2 g/dL (ref 6.0–8.5)
eGFR: 25 mL/min/{1.73_m2} — ABNORMAL LOW (ref >59)

## 2023-04-30 LAB — TSH+T4F+T3FREE
Free T4: 1.33 ng/dL (ref 0.82–1.77)
T3, Free: 2 pg/mL (ref 2.0–4.4)
TSH: 5.27 u[IU]/mL — ABNORMAL HIGH (ref 0.450–4.500)

## 2023-05-01 NOTE — Addendum Note (Signed)
Addended by: Bing Neighbors on: 05/01/2023 12:59 PM   Modules accepted: Orders

## 2023-05-04 ENCOUNTER — Other Ambulatory Visit: Payer: Self-pay

## 2023-05-05 ENCOUNTER — Ambulatory Visit: Payer: Self-pay | Admitting: *Deleted

## 2023-05-05 DIAGNOSIS — E039 Hypothyroidism, unspecified: Secondary | ICD-10-CM | POA: Diagnosis not present

## 2023-05-05 DIAGNOSIS — N184 Chronic kidney disease, stage 4 (severe): Secondary | ICD-10-CM | POA: Diagnosis not present

## 2023-05-05 NOTE — Patient Outreach (Signed)
  Care Coordination   Follow Up Visit Note   05/05/2023 Name: [name] FLAMMER Sr. MRN: [id number] DOB: 1941/04/14  [name] Prom Sr. is a 82 y.o. year old male who sees [name], [name], MD for primary care. I spoke with  [name] Prom Sr. by phone today.  What matters to the patients health and wellness today?  Patient report ongoing swelling in his legs, particularly the one post vascular surgery.  State he has been using leg wraps, which has not helped, and there was discussion regarding lymphedema pumps. He is waiting for call back from vascular to discuss set up.  Denies any urgent concerns, encouraged to contact this care manager with questions.      Goals Addressed             This Visit's Progress    Effective management of chronic medical conditions   On track    Interventions Today    Flowsheet Row Most Recent Value  Chronic Disease   Chronic disease during today's visit Congestive Heart Failure (CHF), Atrial Fibrillation (AFib), Chronic Kidney Disease/End Stage Renal Disease (ESRD), Other  [lymphedema]  General Interventions   General Interventions Discussed/Reviewed General Interventions Reviewed, Doctor Visits, Durable Medical Equipment (DME), Labs  Labs Kidney Function  [Report need for repeat kidney and thyroid labs]  Doctor Visits Discussed/Reviewed Doctor Visits Reviewed, PCP, Specialist  Eather Colas of need for cardiology visit, he will call to schedule]  Durable Medical Equipment (DME) Other  [lymphedema pump]  PCP/Specialist Visits Compliance with follow-up visit  [visit with vascular on 10/21 completed]  Exercise Interventions   Exercise Discussed/Reviewed Weight Managment  Weight Management Weight maintenance  [Report ongoing swelling in leg, weight is 250 pounds]  Education Interventions   Education Provided Provided Education  Provided Verbal Education On Nutrition, Medication, When to see the doctor, Other              SDOH assessments  and interventions completed:  No     Care Coordination Interventions:  Yes, provided   Follow up plan: Follow up call scheduled for 12/3 with Wynona Neat, RNCM    Encounter Outcome:  Patient Visit Completed   Kemper Durie RN, MSN, CCM [facility], [facility] RN Care Coordinator Direct Dial: [phone] / Main [phone] Fax [phone] Email: [email or web address] Website: [email or web address]

## 2023-05-06 LAB — BMP8+EGFR
BUN/Creatinine Ratio: 10 (ref 10–24)
BUN: 24 mg/dL (ref 8–27)
CO2: 25 mmol/L (ref 20–29)
Calcium: 9.1 mg/dL (ref 8.6–10.2)
Chloride: 101 mmol/L (ref 96–106)
Creatinine, Ser: 2.32 mg/dL — ABNORMAL HIGH (ref 0.76–1.27)
Glucose: 97 mg/dL (ref 70–99)
Potassium: 4.4 mmol/L (ref 3.5–5.2)
Sodium: 139 mmol/L (ref 134–144)
eGFR: 28 mL/min/{1.73_m2} — ABNORMAL LOW (ref >59)

## 2023-05-06 LAB — TSH+T4F+T3FREE
Free T4: 1.28 ng/dL (ref 0.82–1.77)
T3, Free: 2 pg/mL (ref 2.0–4.4)
TSH: 6.46 u[IU]/mL — ABNORMAL HIGH (ref 0.450–4.500)

## 2023-05-15 ENCOUNTER — Telehealth (INDEPENDENT_AMBULATORY_CARE_PROVIDER_SITE_OTHER): Payer: Self-pay

## 2023-05-15 NOTE — Telephone Encounter (Signed)
This was received through email from Sherlyn Lick at [facility]:   HI Vernona Rieger, I wanted to let you know that every time we call patient MRN [id number], he answers the phone then immediately hangs up. Neither of his children will answer or return our calls.  Just wanted you and doctor to be aware.    Kristie Cowman Sr. Media planner [employer]

## 2023-05-20 DIAGNOSIS — J039 Acute tonsillitis, unspecified: Secondary | ICD-10-CM | POA: Diagnosis not present

## 2023-05-26 DIAGNOSIS — I480 Paroxysmal atrial fibrillation: Secondary | ICD-10-CM | POA: Diagnosis not present

## 2023-05-26 DIAGNOSIS — N184 Chronic kidney disease, stage 4 (severe): Secondary | ICD-10-CM | POA: Diagnosis not present

## 2023-05-26 DIAGNOSIS — I5020 Unspecified systolic (congestive) heart failure: Secondary | ICD-10-CM | POA: Diagnosis not present

## 2023-05-26 DIAGNOSIS — I10 Essential (primary) hypertension: Secondary | ICD-10-CM | POA: Diagnosis not present

## 2023-05-26 DIAGNOSIS — Z79899 Other long term (current) drug therapy: Secondary | ICD-10-CM | POA: Diagnosis not present

## 2023-05-26 DIAGNOSIS — I420 Dilated cardiomyopathy: Secondary | ICD-10-CM | POA: Diagnosis not present

## 2023-05-26 DIAGNOSIS — Z5181 Encounter for therapeutic drug level monitoring: Secondary | ICD-10-CM | POA: Diagnosis not present

## 2023-06-02 ENCOUNTER — Other Ambulatory Visit: Payer: Self-pay

## 2023-06-02 NOTE — Patient Outreach (Signed)
  Care Management   Visit Note  06/02/2023 Name: [name] Sr. MRN: [id number] DOB: 1940/09/28  Subjective: [name] Prom Sr. is a 82 y.o. year old male who is a primary care patient of [name], [name] Cooler, MD. The Care Management team was consulted for assistance.      Engaged with Mr. Womble via telephone  Assessment:  Outpatient Encounter Medications as of 06/02/2023  Medication Sig   amiodarone (PACERONE) 200 MG tablet Take 1 tablet (200 mg total) by mouth daily.   clopidogrel (PLAVIX) 75 MG tablet Take 1 tablet (75 mg total) by mouth daily.   ferrous sulfate 325 (65 FE) MG tablet Take 1 tablet (325 mg total) by mouth daily with breakfast.   fluticasone (FLONASE) 50 MCG/ACT nasal spray Place 2 sprays into both nostrils daily.   gabapentin (NEURONTIN) 300 MG capsule Take 1 capsule (300 mg total) by mouth 2 (two) times daily.   levothyroxine (SYNTHROID) 100 MCG tablet TAKE 1 TABLET BY MOUTH DAILY BEFORE BREAKFAST   lisinopril (ZESTRIL) 2.5 MG tablet Take 1 tablet (2.5 mg total) by mouth daily.   lovastatin (MEVACOR) 20 MG tablet Take 1 tablet (20 mg total) by mouth at bedtime.   mupirocin ointment (BACTROBAN) 2 % Apply 1 Application topically daily.   naloxone (NARCAN) nasal spray 4 mg/0.1 mL SMARTSIG:Both Nares   torsemide (DEMADEX) 20 MG tablet Take 1 tablet (20 mg total) by mouth 2 (two) times daily.   traMADol (ULTRAM) 50 MG tablet Take 50 mg by mouth 2 (two) times daily as needed.   XARELTO 20 MG TABS tablet Take 1 tablet (20 mg total) by mouth daily.   No facility-administered encounter medications on file as of 06/02/2023.

## 2023-06-18 DIAGNOSIS — I10 Essential (primary) hypertension: Secondary | ICD-10-CM | POA: Diagnosis not present

## 2023-06-18 DIAGNOSIS — J439 Emphysema, unspecified: Secondary | ICD-10-CM | POA: Diagnosis present

## 2023-06-18 DIAGNOSIS — I509 Heart failure, unspecified: Secondary | ICD-10-CM | POA: Diagnosis not present

## 2023-06-18 DIAGNOSIS — R609 Edema, unspecified: Secondary | ICD-10-CM | POA: Diagnosis not present

## 2023-06-18 DIAGNOSIS — I890 Lymphedema, not elsewhere classified: Secondary | ICD-10-CM | POA: Diagnosis not present

## 2023-06-18 DIAGNOSIS — N1832 Chronic kidney disease, stage 3b: Secondary | ICD-10-CM | POA: Diagnosis not present

## 2023-06-18 DIAGNOSIS — D631 Anemia in chronic kidney disease: Secondary | ICD-10-CM | POA: Diagnosis not present

## 2023-06-18 DIAGNOSIS — I4821 Permanent atrial fibrillation: Secondary | ICD-10-CM | POA: Diagnosis not present

## 2023-06-18 DIAGNOSIS — E785 Hyperlipidemia, unspecified: Secondary | ICD-10-CM | POA: Diagnosis not present

## 2023-06-25 DIAGNOSIS — I890 Lymphedema, not elsewhere classified: Secondary | ICD-10-CM | POA: Diagnosis not present

## 2023-07-03 ENCOUNTER — Other Ambulatory Visit: Payer: Self-pay

## 2023-07-21 ENCOUNTER — Other Ambulatory Visit (INDEPENDENT_AMBULATORY_CARE_PROVIDER_SITE_OTHER): Payer: Self-pay | Admitting: Vascular Surgery

## 2023-07-21 DIAGNOSIS — I70213 Atherosclerosis of native arteries of extremities with intermittent claudication, bilateral legs: Secondary | ICD-10-CM

## 2023-07-27 ENCOUNTER — Encounter (INDEPENDENT_AMBULATORY_CARE_PROVIDER_SITE_OTHER): Payer: PPO

## 2023-07-27 ENCOUNTER — Ambulatory Visit (INDEPENDENT_AMBULATORY_CARE_PROVIDER_SITE_OTHER): Payer: PPO | Admitting: Vascular Surgery

## 2023-08-03 ENCOUNTER — Other Ambulatory Visit: Payer: Self-pay

## 2023-08-10 NOTE — Patient Outreach (Signed)
 Care Management   Visit Note   Name: [name] ASHER Sr. MRN: [id number] DOB: 1941-01-12  Subjective: [name] JONETTA Duke Sr. is a 83 y.o. year old male who is a primary care patient of [name], Rockie, MD. The Care Management team was consulted for assistance.      Engaged with [name] via telephone.  Assessment:  Review of patient past medical history, allergies, medications, health status, including review of consultants reports, laboratory and other test data, was performed as part of  evaluation and provision of care management services.    Outpatient Encounter Medications as of 07/03/2023  Medication Sig   amiodarone  (PACERONE ) 200 MG tablet Take 1 tablet (200 mg total) by mouth daily.   clopidogrel  (PLAVIX ) 75 MG tablet Take 1 tablet (75 mg total) by mouth daily.   ferrous sulfate  325 (65 FE) MG tablet Take 1 tablet (325 mg total) by mouth daily with breakfast.   fluticasone  (FLONASE ) 50 MCG/ACT nasal spray Place 2 sprays into both nostrils daily.   gabapentin  (NEURONTIN ) 300 MG capsule Take 1 capsule (300 mg total) by mouth 2 (two) times daily.   levothyroxine  (SYNTHROID ) 100 MCG tablet TAKE 1 TABLET BY MOUTH DAILY BEFORE BREAKFAST   lisinopril  (ZESTRIL ) 2.5 MG tablet Take 1 tablet (2.5 mg total) by mouth daily.   lovastatin  (MEVACOR ) 20 MG tablet Take 1 tablet (20 mg total) by mouth at bedtime.   mupirocin  ointment (BACTROBAN ) 2 % Apply 1 Application topically daily.   naloxone (NARCAN) nasal spray 4 mg/0.1 mL SMARTSIG:Both Nares   torsemide  (DEMADEX ) 20 MG tablet Take 1 tablet (20 mg total) by mouth 2 (two) times daily.   traMADol  (ULTRAM ) 50 MG tablet Take 50 mg by mouth 2 (two) times daily as needed.   XARELTO  20 MG TABS tablet Take 1 tablet (20 mg total) by mouth daily.   No facility-administered encounter medications on file as of 07/03/2023.    Interventions:   Goals Addressed             This Visit's Progress    Monitor, Self-Manage And Reduce  Symptoms of Congestive Heart Failure, Hypertension and  A-Fib       Current Barriers:  Care Management support and education needs related to HTN, HF, AFib   Planned Interventions: HTN, HF and A Fib Reviewed current treatment plans. Reviewed medications and indications for use. Reports taking medications as prescribed. Denies concerns r/t medication management.  Discussed bleeding risk associated with anticoagulants and importance of self-monitoring for signs/symptoms of bleeding. Reviewed established blood pressure parameters along with indications for notifying a provider. Reports some fluctuations with readings but reports most readings have been within range.  Reviewed symptoms. Denies chest pain or palpitations. Denies experiencing shortness of breath, headaches, dizziness or lightheadedness.  Reviewed recommended weight parameters. Encouraged to weigh daily and record readings.  Encouraged to notify provider for weight gain greater than 3 lbs overnight or weight gain greater than 5 lbs within a week.  Discussed s/sx of fluid overload and indications for notifying a provider. Reports moderate edema in lower extremities. Reports taking torsemide  as instructed. Denies abdominal swelling. Denies changes or decline in functional status. Encouraged to continue reading nutrition labels, continue monitoring sodium intake, limited foods high in cholesterol and avoid highly processed foods when possible. Reviewed s/sx of heart attack, stroke and worsening symptoms that require immediate medical attention.   BP Readings from Last 3 Encounters:  04/29/23 (!) 109/34  04/20/23 (!) 101/41  03/15/23 (!) 135/52  Lab Results  Component Value Date   CHOL 152 07/21/2022   HDL 44 07/21/2022   LDLCALC 82 07/21/2022   TRIG 150 (H) 07/21/2022   CHOLHDL 3.5 07/21/2022    Wt Readings from Last 3 Encounters:  04/29/23 250 lb (113.4 kg)  03/15/23 240 lb (108.9 kg)  01/27/23 240 lb (108.9 kg)      Symptom Management: Take medications as prescribed   Attend all scheduled provider appointments Call pharmacy for medication refills 3-7 days in advance of running out of medications Monitor BP and record readings Monitor weight and record readings Notify provider for weight gain outside of established parameters Limit sodium intake Follow recommended safety and fall prevention measures Seek medical attention if you experience a head injury or if there is blood in the urine or stool Call provider office for new concerns or questions           PLAN Will follow up next month   Jackson Acron [facility] RN Care Manager Direct Dial: [phone]  Fax: [phone] Website: [email or web address]

## 2023-09-02 NOTE — Progress Notes (Unsigned)
 MRN : [id number]  [name] JAQUITH Sr. is a 83 y.o. (04/20/1941) male who presents with chief complaint of check circulation.  History of Present Illness:   The patient returns to the office for followup and review status post bypass surgery on October 22, 2022.    Procedure: left common femoral artery to left posterior tibial tibial artery bypass with 6 mm distal flow graft Left profunda femoris endarterectomy endarterectomy   The patient notes improvement in the lower extremity symptoms except that he notes that he is now swelling massively. No interval shortening of the patient's claudication distance or rest pain symptoms.  Previously he was having discomfort which he described as a tightness almost like his skin is being stretched that correlates with his edema.  At this point he has obtained his lymphedema pump and has been using it 1-2 times per day.  This has dramatically improved the feeling of tightness as well as dramatically improved his overall edema.  He notes that in the morning his leg is significantly better but as the course of the day goes on he swells and becomes much more symptomatic.  He has been elevating at home.  No new ulcers or wounds have occurred since the last visit.  He notes that the "egg" in his groin is still there it does not hurt him but its uncomfortable sometimes when he sits.  It is not getting any bigger.   There have been no significant changes to the patient's overall health care.   No documented history of amaurosis fugax or recent TIA symptoms. There are no recent neurological changes noted. No documented history of DVT, PE or superficial thrombophlebitis. The patient denies recent episodes of angina or shortness of breath.    ABI's obtained today rt=0.81 and Lt=0.94  (previous ABI's Rt=0.82 and Lt=0.90).  Duplex US of the bilateral lower extremity arterial system obtained today  shows the right lower extremity arterial system is patent there is a moderate mid SFA lesion of 50 to 70% (no change when compared to the previous visit).  There appears to be three-vessel runoff to the foot.  On the left the bypass remains patent with uniform velocities.  Again noted is the seroma continues groin but is stable in size  No outpatient medications have been marked as taking for the 09/03/23 encounter (Appointment) with Gilda Crease, Latina Craver, MD.    Past Medical History:  Diagnosis Date   A-fib [facility])    a.) CHA2DS2VASc = 5 (age x2, CHF, HTN, vascular disease history);  b.) rate/rhythm maintained on oral amiodarone; chronically anticoagulated with rivaroxaban   Allergy    Arthritis    CAD (coronary artery disease) 11/17/2012   a.) LHC 11/17/2012: 30% mLM, 30% mLAD, 30% mLCx, 50%/30% OM1, 30% pRCA - med mgmt   Chronic systolic CHF (congestive heart failure), NYHA class 2 (HCC)    a.) TTE 05/30/2015: EF 40%, glob HK, mild LVH, mild LAE, mild MR/TR; b.) TTE 10/14/2017: EF 40%, glob HK, mild LVH, mod BAE, triv PR, mild MR/TR, G1DD   Complication of anesthesia    a.) hypotension  during routine colonoscopy   COPD (chronic obstructive pulmonary disease) (HCC)    DDD (degenerative disc disease), lumbar    Dilated cardiomyopathy (HCC)    a.) LHC 11/17/2012: EF 35%; b.) TTE 05/30/2015: EF 40%; c.) TTE 10/14/2017: EF 40%   GERD (gastroesophageal reflux disease)    History of chicken pox    Hyperlipidemia    Hypertension    Hypothyroidism    Long term current use of amiodarone    Long term current use of anticoagulant    a.) Rivaroxaban   Osteoporosis    Peripheral vascular disease (HCC)    Pre-diabetes    Spinal stenosis of lumbar region without neurogenic claudication    Stage 3b chronic kidney disease (HCC)     Past Surgical History:  Procedure Laterality Date   BACK SURGERY     x5   DOPPLER ECHOCARDIOGRAPHY  06/21/2013   EF=35% while in Sinus Rhythm   ENDARTERECTOMY FEMORAL  Left 10/22/2022   Procedure: ENDARTERECTOMY FEMORAL;  Surgeon: Renford Dills, MD;  Location: [facility] ORS;  Service: Vascular;  Laterality: Left;   FEMORAL-TIBIAL BYPASS GRAFT Left 10/22/2022   Procedure: BYPASS GRAFT FEMORAL-TIBIAL ARTERY;  Surgeon: Renford Dills, MD;  Location: [facility] ORS;  Service: Vascular;  Laterality: Left;   LEFT HEART CATH AND CORONARY ANGIOGRAPHY Left 11/17/2012   Procedure: LEFT HEART CATHETERIZATION AND CORONARY ANGIOGRAPHY; Location: [facility]; Surgeon: Marcina Millard, MD   LOWER EXTREMITY ANGIOGRAPHY Right 05/20/2022   Procedure: Lower Extremity Angiography;  Surgeon: Renford Dills, MD;  Location: [facility] INVASIVE CV LAB;  Service: Cardiovascular;  Laterality: Right;   LOWER EXTREMITY ANGIOGRAPHY Left 07/15/2022   Procedure: Lower Extremity Angiography;  Surgeon: Renford Dills, MD;  Location: [facility] INVASIVE CV LAB;  Service: Cardiovascular;  Laterality: Left;   Myocardial perfusion scan  10/28/2012   severe global LV enlargement. Mild RV enlargement. Mild LVH. Mild mitral and tricuspid insufficency   polyp excision  09/28/2005   UPPER GASTROINTESTINAL ENDOSCOPY  03/27/2004   Gastropathy, Gastritis; Duodenopathy    Social History Social History   Tobacco Use   Smoking status: Former    Current packs/day: 0.00    Average packs/day: 2.0 packs/day for 50.0 years (100.0 ttl pk-yrs)    Types: Cigarettes    Start date: 06/30/1957    Quit date: 07/01/2007    Years since quitting: 16.1   Smokeless tobacco: Never  Vaping Use   Vaping status: Never Used  Substance Use Topics   Alcohol use: No    Alcohol/week: 0.0 standard drinks of alcohol   Drug use: No    Family History Family History  Problem Relation Age of Onset   Alzheimer's disease Mother    Heart attack Father    Colon cancer Brother     No Known Allergies   REVIEW OF SYSTEMS (Negative unless checked)  Constitutional: [] Weight loss  [] Fever  [] Chills Cardiac: [] Chest pain   [] Chest  pressure   [] Palpitations   [] Shortness of breath when laying flat   [] Shortness of breath with exertion. Vascular:  [x] Pain in legs with walking   [] Pain in legs at rest  [] History of DVT   [] Phlebitis   [x] Swelling in legs   [] Varicose veins   [] Non-healing ulcers Pulmonary:   [] Uses home oxygen   [] Productive cough   [] Hemoptysis   [] Wheeze  [] COPD   [] Asthma Neurologic:  [] Dizziness   [] Seizures   [] History of stroke   [] History of TIA  [] Aphasia   [] Vissual changes   [] Weakness or  numbness in arm   [x] Weakness or numbness in leg Musculoskeletal:   [] Joint swelling   [x] Joint pain   [x] Low back pain Hematologic:  [] Easy bruising  [] Easy bleeding   [] Hypercoagulable state   [] Anemic Gastrointestinal:  [] Diarrhea   [] Vomiting  [] Gastroesophageal reflux/heartburn   [] Difficulty swallowing. Genitourinary:  [x] Chronic kidney disease   [] Difficult urination  [] Frequent urination   [] Blood in urine Skin:  [] Rashes   [] Ulcers  Psychological:  [] History of anxiety   []  History of major depression.  Physical Examination  There were no vitals filed for this visit. There is no height or weight on file to calculate BMI. Gen: WD/WN, NAD Head: /AT, No temporalis wasting.  Ear/Nose/Throat: Hearing grossly intact, nares w/o erythema or drainage Eyes: PER, EOMI, sclera nonicteric.  Neck: Supple, no masses.  No bruit or JVD.  Pulmonary:  Good air movement, no audible wheezing, no use of accessory muscles.  Cardiac: RRR, normal S1, S2, no Murmurs. Vascular:  mild trophic changes, no open wounds Vessel Right Left  Radial Palpable Palpable  PT Not Palpable Not Palpable  DP Not Palpable Not Palpable  Gastrointestinal: soft, non-distended. No guarding/no peritoneal signs.  Musculoskeletal: M/S 5/5 throughout.  No visible deformity.  Neurologic: CN 2-12 intact. Pain and light touch intact in extremities.  Symmetrical.  Speech is fluent. Motor exam as listed above. Psychiatric: Judgment intact, Mood &  affect appropriate for pt's clinical situation. Dermatologic: No rashes or ulcers noted.  No changes consistent with cellulitis.   CBC Lab Results  Component Value Date   WBC 9.2 01/27/2023   HGB 7.9 (L) 01/27/2023   HCT 25.9 (L) 01/27/2023   MCV 83 01/27/2023   PLT 265 01/27/2023    BMET    Component Value Date/Time   NA 139 05/05/2023 1344   K 4.4 05/05/2023 1344   CL 101 05/05/2023 1344   CO2 25 05/05/2023 1344   GLUCOSE 97 05/05/2023 1344   GLUCOSE 115 (H) 10/26/2022 0853   BUN 24 05/05/2023 1344   CREATININE 2.32 (H) 05/05/2023 1344   CREATININE 1.71 (H) 03/19/2017 0813   CALCIUM 9.1 05/05/2023 1344   GFRNONAA 41 (L) 10/26/2022 0853   GFRNONAA 39 (L) 03/19/2017 0813   GFRAA 39 (L) 04/04/2020 0808   GFRAA 45 (L) 03/19/2017 0813   CrCl cannot be calculated (Patient's most recent lab result is older than the maximum 21 days allowed.).  COAG Lab Results  Component Value Date   INR 1.4 (H) 11/26/2021    Radiology No results found.   Assessment/Plan 1. Atherosclerosis of native artery of both lower extremities with intermittent claudication (HCC) (Primary)  - VAS [place] LOWER EXTREMITY ARTERIAL DUPLEX; Future - VAS [place] ABI WITH/WO TBI; Future  Recommend:  The patient is status post successful angiogram with intervention on the right as well as successful bypass on the left.  Both reconstructions remain patent.  The patient reports that the claudication symptoms and leg pain has improved.   The patient denies lifestyle limiting changes at this point in time.  No further invasive studies, angiography or surgery at this time. The patient should continue walking and begin a more formal exercise program.  The patient should continue antiplatelet therapy and aggressive treatment of the lipid abnormalities  Continued surveillance is indicated as atherosclerosis is likely to progress with time.    Patient should undergo noninvasive studies as ordered. The patient will  follow up with me to review the studies.   2. Lymphedema Recommend:  No surgery  or intervention at this point in time.    I have reviewed my discussion with the patient regarding lymphedema and why it  causes symptoms.  Patient will continue wearing graduated compression on a daily basis. The patient should put the compression on first thing in the morning and removing them in the evening. The patient should not sleep in the compression.   In addition, behavioral modification throughout the day will be continued.  This will include frequent elevation (such as in a recliner), use of over the counter pain medications as needed and exercise such as walking.  The systemic causes for chronic edema such as liver, kidney and cardiac etiologies does not appear to have significant changed over the past year.    The patient will continue aggressive use of the  lymph pump.  This will continue to improve the edema control and prevent sequela such as ulcers and infections.   The patient will follow-up with me on an annual basis.   3. Permanent atrial fibrillation (HCC) Continue antiarrhythmia medications as already ordered, these medications have been reviewed and there are no changes at this time.  Continue anticoagulation as ordered by Cardiology Service  4. Primary hypertension Continue antihypertensive medications as already ordered, these medications have been reviewed and there are no changes at this time.  5. Degeneration of intervertebral disc of lumbar region, unspecified whether pain present Continue medications to treat the patient's degenerative disease as already ordered, these medications have been reviewed and there are no changes at this time.  Continued activity and therapy was stressed.  6. Stage 4 chronic kidney disease (HCC) The patient has advanced renal disease.  However, at the present time the patient is not yet on dialysis.  Avoid nephrotoxic medications and  dehydration.  Further plans per nephrology    Levora Dredge, MD  09/02/2023 10:35 AM

## 2023-09-03 ENCOUNTER — Ambulatory Visit (INDEPENDENT_AMBULATORY_CARE_PROVIDER_SITE_OTHER): Payer: PPO | Admitting: Vascular Surgery

## 2023-09-03 ENCOUNTER — Ambulatory Visit (INDEPENDENT_AMBULATORY_CARE_PROVIDER_SITE_OTHER): Payer: PPO

## 2023-09-03 ENCOUNTER — Encounter (INDEPENDENT_AMBULATORY_CARE_PROVIDER_SITE_OTHER): Payer: Self-pay | Admitting: Vascular Surgery

## 2023-09-03 VITALS — BP 106/68 | HR 86 | Resp 18 | Ht 74.0 in | Wt 250.0 lb

## 2023-09-03 DIAGNOSIS — M51369 Other intervertebral disc degeneration, lumbar region without mention of lumbar back pain or lower extremity pain: Secondary | ICD-10-CM | POA: Diagnosis not present

## 2023-09-03 DIAGNOSIS — I10 Essential (primary) hypertension: Secondary | ICD-10-CM

## 2023-09-03 DIAGNOSIS — I70213 Atherosclerosis of native arteries of extremities with intermittent claudication, bilateral legs: Secondary | ICD-10-CM | POA: Diagnosis not present

## 2023-09-03 DIAGNOSIS — N184 Chronic kidney disease, stage 4 (severe): Secondary | ICD-10-CM

## 2023-09-03 DIAGNOSIS — I4821 Permanent atrial fibrillation: Secondary | ICD-10-CM | POA: Diagnosis not present

## 2023-09-03 DIAGNOSIS — I890 Lymphedema, not elsewhere classified: Secondary | ICD-10-CM | POA: Diagnosis not present

## 2023-09-04 LAB — VAS US ABI WITH/WO TBI
Left ABI: 0.94
Right ABI: 0.81

## 2023-09-16 DIAGNOSIS — I5020 Unspecified systolic (congestive) heart failure: Secondary | ICD-10-CM | POA: Diagnosis not present

## 2023-09-17 DIAGNOSIS — I509 Heart failure, unspecified: Secondary | ICD-10-CM | POA: Diagnosis not present

## 2023-09-17 DIAGNOSIS — R609 Edema, unspecified: Secondary | ICD-10-CM | POA: Diagnosis not present

## 2023-09-17 DIAGNOSIS — N1832 Chronic kidney disease, stage 3b: Secondary | ICD-10-CM | POA: Diagnosis not present

## 2023-09-17 DIAGNOSIS — I4821 Permanent atrial fibrillation: Secondary | ICD-10-CM | POA: Diagnosis not present

## 2023-09-17 DIAGNOSIS — E785 Hyperlipidemia, unspecified: Secondary | ICD-10-CM | POA: Diagnosis not present

## 2023-09-17 DIAGNOSIS — D631 Anemia in chronic kidney disease: Secondary | ICD-10-CM | POA: Diagnosis not present

## 2023-09-17 DIAGNOSIS — I890 Lymphedema, not elsewhere classified: Secondary | ICD-10-CM | POA: Diagnosis not present

## 2023-09-17 DIAGNOSIS — I10 Essential (primary) hypertension: Secondary | ICD-10-CM | POA: Diagnosis not present

## 2023-09-17 DIAGNOSIS — J439 Emphysema, unspecified: Secondary | ICD-10-CM | POA: Diagnosis not present

## 2023-09-21 ENCOUNTER — Telehealth: Payer: Self-pay

## 2023-09-21 NOTE — Telephone Encounter (Signed)
 Called, but wasn't able to leave a voice message, phone line was busy, needed to  informing the pt, his DMV forms has been completed and ready for pick up.

## 2023-09-23 DIAGNOSIS — R0602 Shortness of breath: Secondary | ICD-10-CM | POA: Diagnosis not present

## 2023-09-23 DIAGNOSIS — I420 Dilated cardiomyopathy: Secondary | ICD-10-CM | POA: Diagnosis not present

## 2023-09-23 DIAGNOSIS — I5022 Chronic systolic (congestive) heart failure: Secondary | ICD-10-CM | POA: Diagnosis not present

## 2023-09-23 DIAGNOSIS — I10 Essential (primary) hypertension: Secondary | ICD-10-CM | POA: Diagnosis not present

## 2023-09-23 DIAGNOSIS — I480 Paroxysmal atrial fibrillation: Secondary | ICD-10-CM | POA: Diagnosis not present

## 2023-09-23 DIAGNOSIS — E785 Hyperlipidemia, unspecified: Secondary | ICD-10-CM | POA: Diagnosis not present

## 2023-09-25 ENCOUNTER — Other Ambulatory Visit: Payer: Self-pay

## 2023-09-25 NOTE — Patient Outreach (Signed)
 Care Management   Visit Note  09/25/2023 Name: BAKARI NIKOLAI Sr. MRN: [id number] DOB: 1940/11/28  Subjective: [name] is a 83 y.o. year old male who is a primary care patient of [name], [name], MD. Engaged with Mr. Fouch via telephone.   Assessment:  Review of patient past medical history, allergies, medications, health status, including review of consultants reports, laboratory and other test data, was performed as part of  evaluation and provision of care management services.    Outpatient Encounter Medications as of 09/25/2023  Medication Sig   amiodarone (PACERONE) 200 MG tablet Take 1 tablet (200 mg total) by mouth daily.   clopidogrel (PLAVIX) 75 MG tablet Take 1 tablet (75 mg total) by mouth daily.   ferrous sulfate 325 (65 FE) MG tablet Take 1 tablet (325 mg total) by mouth daily with breakfast.   fluticasone (FLONASE) 50 MCG/ACT nasal spray Place 2 sprays into both nostrils daily.   gabapentin (NEURONTIN) 300 MG capsule Take 1 capsule (300 mg total) by mouth 2 (two) times daily.   levothyroxine (SYNTHROID) 100 MCG tablet TAKE 1 TABLET BY MOUTH DAILY BEFORE BREAKFAST   lisinopril (ZESTRIL) 2.5 MG tablet Take 1 tablet (2.5 mg total) by mouth daily.   lovastatin (MEVACOR) 20 MG tablet Take 1 tablet (20 mg total) by mouth at bedtime.   mupirocin ointment (BACTROBAN) 2 % Apply 1 Application topically daily.   naloxone (NARCAN) nasal spray 4 mg/0.1 mL SMARTSIG:Both Nares   torsemide (DEMADEX) 20 MG tablet Take 1 tablet (20 mg total) by mouth 2 (two) times daily.   traMADol (ULTRAM) 50 MG tablet Take 50 mg by mouth 2 (two) times daily as needed.   XARELTO 20 MG TABS tablet Take 1 tablet (20 mg total) by mouth daily.   No facility-administered encounter medications on file as of 09/25/2023.    Interventions:   Goals Addressed             This Visit's Progress    Effective management of chronic medical conditions       Current Barriers:  Care  Management support and education needs related to effective management of chronic conditions.  BP Readings from Last 3 Encounters:  09/03/23 106/68  04/29/23 (!) 109/34  04/20/23 (!) 101/41    Wt Readings from Last 3 Encounters:  09/03/23 250 lb (113.4 kg)  04/29/23 250 lb (113.4 kg)  03/15/23 240 lb (108.9 kg)   Interventions Today    Flowsheet Row Most Recent Value  Chronic Disease   Chronic disease during today's visit Congestive Heart Failure (CHF), Hypertension (HTN), Atrial Fibrillation (AFib), Chronic Kidney Disease/End Stage Renal Disease (ESRD), Other  [Hyperlipidemia]  General Interventions   General Interventions Discussed/Reviewed General Interventions Reviewed, Labs, Lipid Profile, Doctor Visits, [facility] Kidney Function  Doctor Visits Discussed/Reviewed Doctor Visits Reviewed, Annual Wellness Visits, PCP, Specialist  PCP/Specialist Visits Compliance with follow-up visit  [Pending Annual Wellness on 10/06/23, Physical Medicine on 10/14/23, Primary Care visit on 10/28/23 and Cardiology follow up on 12/28/23]  Exercise Interventions   Exercise Discussed/Reviewed Physical Activity  Physical Activity Discussed/Reviewed Physical Activity Reviewed  Education Interventions   Education Provided Provided Education  Provided Verbal Education On Nutrition, Labs, Medication, When to see the doctor, [facility] Reviewed Kidney Function, Lipid Profile  Mental Health Interventions   Mental Health Discussed/Reviewed Mental Health Reviewed  Nutrition Interventions   Nutrition Discussed/Reviewed Nutrition Reviewed, Fluid intake, Decreasing fats, Decreasing salt  Pharmacy Interventions   Pharmacy Dicussed/Reviewed  Pharmacy Topics Reviewed, Medications and their functions  Safety Interventions   Safety Discussed/Reviewed Safety Reviewed, Fall Risk               PLAN Mr. Domenic Polite requested quarterly outreach. Agreed to follow up in June.    Juanell Fairly [facility] Population Health RN Care Manager Direct Dial: [phone]  Fax: [phone] Website: [email or web address]

## 2023-09-25 NOTE — Patient Instructions (Addendum)
 Thank you for allowing the  Care Management team to participate in your care. It was great speaking with you today!  We will complete your quarterly follow up on December 28, 2023. Please do not hesitate to contact me if you require assistance prior to our next outreach.    Juanell Fairly [facility] Population Health RN Care Manager Direct Dial: [phone]  Fax: [phone] Website: [email or web address]

## 2023-10-02 ENCOUNTER — Ambulatory Visit: Admitting: Family Medicine

## 2023-10-06 ENCOUNTER — Ambulatory Visit: Payer: Self-pay

## 2023-10-06 DIAGNOSIS — Z0000 Encounter for general adult medical examination without abnormal findings: Secondary | ICD-10-CM

## 2023-10-06 NOTE — Progress Notes (Signed)
 Subjective:   [name] HEMPHILL Sr. is a 83 y.o. who presents for a Medicare Wellness preventive visit.  Visit Complete: Virtual I connected with  [name] ALBERS Sr. on 10/06/23 by a audio enabled telemedicine application and verified that I am speaking with the correct person using two identifiers.  Patient Location: Home  Provider Location: Office/Clinic  I discussed the limitations of evaluation and management by telemedicine. The patient expressed understanding and agreed to proceed.  Vital Signs: Because this visit was a virtual/telehealth visit, some criteria may be missing or patient reported. Any vitals not documented were not able to be obtained and vitals that have been documented are patient reported.  VideoDeclined- This patient declined Librarian, academic. Therefore the visit was completed with audio only.  Persons Participating in Visit: Patient.  AWV Questionnaire: No: Patient Medicare AWV questionnaire was not completed prior to this visit.  Cardiac Risk Factors include: advanced age (>47men, >69 women);hypertension;male gender;dyslipidemia;obesity (BMI >30kg/m2)     Objective:    There were no vitals filed for this visit. There is no height or weight on file to calculate BMI.     10/06/2023   11:39 AM 03/15/2023    8:41 AM 10/22/2022    8:47 AM 10/13/2022    1:40 PM 10/01/2022   11:23 AM 07/23/2022    3:31 PM 07/15/2022   10:09 AM  Advanced Directives  Does Patient Have a Medical Advance Directive? Yes Yes Yes Yes Yes Yes Yes  Type of Estate agent of [place];Living will Healthcare Power of [place];Living will Living will Living will;Out of facility DNR (pink MOST or yellow form)  Healthcare Power of [place];Living will Living will;Healthcare Power of Attorney  Does patient want to make changes to medical advance directive? No - Patient declined  No - Patient declined No - Patient declined     Copy of Healthcare  Power of Attorney in Chart? Yes - validated most recent copy scanned in chart (See row information)          Current Medications (verified) Outpatient Encounter Medications as of 10/06/2023  Medication Sig   amiodarone (PACERONE) 200 MG tablet Take 1 tablet (200 mg total) by mouth daily.   clopidogrel (PLAVIX) 75 MG tablet Take 1 tablet (75 mg total) by mouth daily.   ferrous sulfate 325 (65 FE) MG tablet Take 1 tablet (325 mg total) by mouth daily with breakfast.   fluticasone (FLONASE) 50 MCG/ACT nasal spray Place 2 sprays into both nostrils daily.   gabapentin (NEURONTIN) 300 MG capsule Take 1 capsule (300 mg total) by mouth 2 (two) times daily.   levothyroxine (SYNTHROID) 100 MCG tablet TAKE 1 TABLET BY MOUTH DAILY BEFORE BREAKFAST   lisinopril (ZESTRIL) 2.5 MG tablet Take 1 tablet (2.5 mg total) by mouth daily.   lovastatin (MEVACOR) 20 MG tablet Take 1 tablet (20 mg total) by mouth at bedtime.   mupirocin ointment (BACTROBAN) 2 % Apply 1 Application topically daily.   naloxone (NARCAN) nasal spray 4 mg/0.1 mL SMARTSIG:Both Nares   torsemide (DEMADEX) 20 MG tablet Take 1 tablet (20 mg total) by mouth 2 (two) times daily.   traMADol (ULTRAM) 50 MG tablet Take 50 mg by mouth 2 (two) times daily as needed.   XARELTO 20 MG TABS tablet Take 1 tablet (20 mg total) by mouth daily.   No facility-administered encounter medications on file as of 10/06/2023.    Allergies (verified) Patient has no known allergies.   History: Past  Medical History:  Diagnosis Date   A-fib [facility])    a.) CHA2DS2VASc = 5 (age x2, CHF, HTN, vascular disease history);  b.) rate/rhythm maintained on oral amiodarone; chronically anticoagulated with rivaroxaban   Allergy    Arthritis    CAD (coronary artery disease) 11/17/2012   a.) LHC 11/17/2012: 30% mLM, 30% mLAD, 30% mLCx, 50%/30% OM1, 30% pRCA - med mgmt   Chronic systolic CHF (congestive heart failure), NYHA class 2 (HCC)    a.) TTE 05/30/2015: EF 40%, glob HK,  mild LVH, mild LAE, mild MR/TR; b.) TTE 10/14/2017: EF 40%, glob HK, mild LVH, mod BAE, triv PR, mild MR/TR, G1DD   Complication of anesthesia    a.) hypotension during routine colonoscopy   COPD (chronic obstructive pulmonary disease) (HCC)    DDD (degenerative disc disease), lumbar    Dilated cardiomyopathy (HCC)    a.) LHC 11/17/2012: EF 35%; b.) TTE 05/30/2015: EF 40%; c.) TTE 10/14/2017: EF 40%   GERD (gastroesophageal reflux disease)    History of chicken pox    Hyperlipidemia    Hypertension    Hypothyroidism    Long term current use of amiodarone    Long term current use of anticoagulant    a.) Rivaroxaban   Osteoporosis    Peripheral vascular disease (HCC)    Pre-diabetes    Spinal stenosis of lumbar region without neurogenic claudication    Stage 3b chronic kidney disease (HCC)    Past Surgical History:  Procedure Laterality Date   BACK SURGERY     x5   DOPPLER ECHOCARDIOGRAPHY  06/21/2013   EF=35% while in Sinus Rhythm   ENDARTERECTOMY FEMORAL Left 10/22/2022   Procedure: ENDARTERECTOMY FEMORAL;  Surgeon: Renford Dills, MD;  Location: [facility] ORS;  Service: Vascular;  Laterality: Left;   FEMORAL-TIBIAL BYPASS GRAFT Left 10/22/2022   Procedure: BYPASS GRAFT FEMORAL-TIBIAL ARTERY;  Surgeon: Renford Dills, MD;  Location: [facility] ORS;  Service: Vascular;  Laterality: Left;   LEFT HEART CATH AND CORONARY ANGIOGRAPHY Left 11/17/2012   Procedure: LEFT HEART CATHETERIZATION AND CORONARY ANGIOGRAPHY; Location: [facility]; Surgeon: Marcina Millard, MD   LOWER EXTREMITY ANGIOGRAPHY Right 05/20/2022   Procedure: Lower Extremity Angiography;  Surgeon: Renford Dills, MD;  Location: [facility] INVASIVE CV LAB;  Service: Cardiovascular;  Laterality: Right;   LOWER EXTREMITY ANGIOGRAPHY Left 07/15/2022   Procedure: Lower Extremity Angiography;  Surgeon: Renford Dills, MD;  Location: [facility] INVASIVE CV LAB;  Service: Cardiovascular;  Laterality: Left;   Myocardial perfusion scan   10/28/2012   severe global LV enlargement. Mild RV enlargement. Mild LVH. Mild mitral and tricuspid insufficency   polyp excision  09/28/2005   UPPER GASTROINTESTINAL ENDOSCOPY  03/27/2004   Gastropathy, Gastritis; Duodenopathy   Family History  Problem Relation Age of Onset   Alzheimer's disease Mother    Heart attack Father    Colon cancer Brother    Social History   Socioeconomic History   Marital status: Widowed    Spouse name: Not on file   Number of children: 2   Years of education: Not on file   Highest education level: 12th grade  Occupational History   Occupation: Retired/ Disabled  Tobacco Use   Smoking status: Former    Current packs/day: 0.00    Average packs/day: 2.0 packs/day for 50.0 years (100.0 ttl pk-yrs)    Types: Cigarettes    Start date: 06/30/1957    Quit date: 07/01/2007    Years since quitting: 16.2   Smokeless tobacco: Never  Vaping Use   Vaping status: Never Used  Substance and Sexual Activity   Alcohol use: No    Alcohol/week: 0.0 standard drinks of alcohol   Drug use: No   Sexual activity: Not on file  Other Topics Concern   Not on file  Social History Narrative   Not on file   Social Drivers of Health   Financial Resource Strain: Low Risk  (10/06/2023)   Overall Financial Resource Strain (CARDIA)    Difficulty of Paying Living Expenses: Not hard at all  Food Insecurity: No Food Insecurity (10/06/2023)   Hunger Vital Sign    Worried About Running Out of Food in the Last Year: Never true    Ran Out of Food in the Last Year: Never true  Transportation Needs: No Transportation Needs (10/06/2023)   PRAPARE - Administrator, Civil Service (Medical): No    Lack of Transportation (Non-Medical): No  Physical Activity: Sufficiently Active (10/06/2023)   Exercise Vital Sign    Days of Exercise per Week: 7 days    Minutes of Exercise per Session: 40 min  Stress: No Stress Concern Present (10/06/2023)   [employer] of Occupational Health  - Occupational Stress Questionnaire    Feeling of Stress : Not at all  Social Connections: Socially Isolated (10/06/2023)   Social Connection and Isolation Panel [NHANES]    Frequency of Communication with Friends and Family: More than three times a week    Frequency of Social Gatherings with Friends and Family: More than three times a week    Attends Religious Services: Never    Database administrator or Organizations: No    Attends Banker Meetings: Never    Marital Status: Widowed    Tobacco Counseling Counseling given: Not Answered    Clinical Intake:  Pre-visit preparation completed: Yes  Pain : No/denies pain     BMI - recorded: 32.1 Nutritional Status: BMI > 30  Obese Nutritional Risks: None Diabetes: No  Lab Results  Component Value Date   HGBA1C 5.5 07/21/2022   HGBA1C 5.7 (H) 06/12/2021   HGBA1C 5.5 04/04/2020     How often do you need to have someone help you when you read instructions, pamphlets, or other written materials from your doctor or pharmacy?: 1 - Never  Interpreter Needed?: No  Information entered by :: Kennedy Bucker, LPN   Activities of Daily Living     10/06/2023   11:40 AM 01/27/2023    8:19 AM  In your present state of health, do you have any difficulty performing the following activities:  Hearing? 0 0  Vision? 0 0  Difficulty concentrating or making decisions? 0 0  Walking or climbing stairs? 1 1  Dressing or bathing? 0 0  Doing errands, shopping? 0 0  Preparing Food and eating ? N   Using the Toilet? N   In the past six months, have you accidently leaked urine? N   Do you have problems with loss of bowel control? N   Managing your Medications? N   Managing your Finances? N   Housekeeping or managing your Housekeeping? Y     Patient Care Team: Ronnald Ramp, MD as PCP - General (Family Medicine) Marcina Millard, MD as Consulting Physician (Cardiology) Edward Jolly, MD as Consulting Physician  (Pain Medicine) Lorain Childes, MD as Consulting Physician (Nephrology) Schnier, Latina Craver, MD (Vascular Surgery) Juanell Fairly, RN as [facility] (Optometry)  Indicate any recent Medical  Services you may have received from other than [facility] providers in the past year (date may be approximate).     Assessment:   This is a routine wellness examination for [name].  Hearing/Vision screen Hearing Screening - Comments:: NO AIDS Vision Screening - Comments:: READERS- [facility]   Goals Addressed             This Visit's Progress    Cut out extra servings         Depression Screen     10/06/2023   11:37 AM 09/25/2023    1:48 PM 04/29/2023    8:18 AM 01/27/2023    8:19 AM 10/01/2022   11:18 AM 07/21/2022    1:24 PM 09/02/2021    8:28 AM  PHQ 2/9 Scores  PHQ - 2 Score 0 0 0 0 0 0 0  PHQ- 9 Score 0     0     Fall Risk     10/06/2023   11:40 AM 01/27/2023    8:19 AM 10/01/2022   11:15 AM 09/02/2021    8:31 AM 08/13/2021   10:35 AM  Fall Risk   Falls in the past year? 0 0 0 0 0  Number falls in past yr: 0 0 0 0 0  Injury with Fall? 0 0 0 0   Risk for fall due to : No Fall Risks Impaired balance/gait No Fall Risks No Fall Risks No Fall Risks  Follow up Falls prevention discussed;Falls evaluation completed Falls evaluation completed Education provided;Falls prevention discussed Falls evaluation completed Falls evaluation completed    MEDICARE RISK AT HOME:  Medicare Risk at Home Any stairs in or around the home?: No If so, are there any without handrails?: No Home free of loose throw rugs in walkways, pet beds, electrical cords, etc?: Yes Adequate lighting in your home to reduce risk of falls?: Yes Life alert?: No Use of a cane, walker or w/c?: Yes (WALKER EVERY DAY) Grab bars in the bathroom?: Yes Shower chair or bench in shower?: No Elevated toilet seat or a handicapped toilet?: Yes  TIMED UP AND GO:  Was the test performed?  No  Cognitive  Function: 6CIT completed        10/06/2023   11:42 AM 10/01/2022   11:25 AM  6CIT Screen  What Year? 0 points 0 points  What month? 0 points 0 points  What time? 0 points 0 points  Count back from 20 0 points 0 points  Months in reverse 0 points 0 points  Repeat phrase 0 points 0 points  Total Score 0 points 0 points    Immunizations Immunization History  Administered Date(s) Administered   Fluad Quad(high Dose 65+) 04/03/2020, 04/16/2022   Fluad Trivalent(High Dose 65+) 04/15/2023   Influenza Split 03/22/2012   Influenza, High Dose Seasonal PF 05/23/2014, 06/04/2015, 03/18/2017, 03/17/2018   Pneumococcal Conjugate-13 05/23/2014   Pneumococcal Polysaccharide-23 06/04/2015   Tdap 04/21/2014, 03/15/2023    Screening Tests Health Maintenance  Topic Date Due   COVID-19 Vaccine (1) Never done   Zoster Vaccines- Shingrix (1 of 2) Never done   INFLUENZA VACCINE  01/29/2024   Medicare Annual Wellness (AWV)  10/05/2024   DTaP/Tdap/Td (3 - Td or Tdap) 03/14/2033   Pneumonia Vaccine 106+ Years old  Completed   HPV VACCINES  Aged Out    Health Maintenance  Health Maintenance Due  Topic Date Due   COVID-19 Vaccine (1) Never done   Zoster Vaccines- Shingrix (1 of 2) Never done  Health Maintenance Items Addressed: UP TO DATE W/ SHOTS; DECLINES ALL COVID & SHINGLES SHOTS  Additional Screening:  Vision Screening: Recommended annual ophthalmology exams for early detection of glaucoma and other disorders of the eye.  Dental Screening: Recommended annual dental exams for proper oral hygiene  Community Resource Referral / Chronic Care Management: CRR required this visit?  No   CCM required this visit?  No     Plan:     I have personally reviewed and noted the following in the patient's chart:   Medical and social history Use of alcohol, tobacco or illicit drugs  Current medications and supplements including opioid prescriptions. Patient is not currently taking opioid  prescriptions. Functional ability and status Nutritional status Physical activity Advanced directives List of other physicians Hospitalizations, surgeries, and ER visits in previous 12 months Vitals Screenings to include cognitive, depression, and falls Referrals and appointments  In addition, I have reviewed and discussed with patient certain preventive protocols, quality metrics, and best practice recommendations. A written personalized care plan for preventive services as well as general preventive health recommendations were provided to patient.     Hal Hope, LPN   02/28/4781   After Visit Summary: (MyChart) Due to this being a telephonic visit, the after visit summary with patients personalized plan was offered to patient via MyChart   Notes: Nothing significant to report at this time.

## 2023-10-06 NOTE — Patient Instructions (Addendum)
 [name] , Thank you for taking time to come for your Medicare Wellness Visit. I appreciate your ongoing commitment to your health goals. Please review the following plan we discussed and let me know if I can assist you in the future.   Referrals/Orders/Follow-Ups/Clinician Recommendations: NONE  This is a list of the screening recommended for you and due dates:  Health Maintenance  Topic Date Due   COVID-19 Vaccine (1) Never done   Zoster (Shingles) Vaccine (1 of 2) Never done   Flu Shot  01/29/2024   Medicare Annual Wellness Visit  10/05/2024   DTaP/Tdap/Td vaccine (3 - Td or Tdap) 03/14/2033   Pneumonia Vaccine  Completed   HPV Vaccine  Aged Out    Advanced directives: (ACP Link)Information on Advanced Care Planning can be found at [facility] Advance Health Care Directives Advance Health Care Directives. [email or web address]   Next Medicare Annual Wellness Visit scheduled for next year: Yes   10/11/24 @ 10:10 AM BY PHONE

## 2023-10-12 ENCOUNTER — Other Ambulatory Visit: Payer: Self-pay | Admitting: Family Medicine

## 2023-10-12 DIAGNOSIS — E782 Mixed hyperlipidemia: Secondary | ICD-10-CM

## 2023-10-12 NOTE — Telephone Encounter (Signed)
 Copied from CRM [id number]. Topic: Clinical - Medication Refill >> Oct 12, 2023  9:21 AM Donald Frost wrote: Most Recent Primary Care Visit:  Provider: Pinky Bright  Department: [facility]  Visit Type: MEDICARE AWV, SEQUENTIAL  Date: 10/06/2023  Medication: lovastatin (MEVACOR) 20 MG tablet  Has the patient contacted their pharmacy? Yes   Is this the correct pharmacy for this prescription? Yes If no, delete pharmacy and type the correct one.  This is the patient's preferred pharmacy:  [id number] - [place], [place] - [address] AT [facility] OF [address] [place] [address] [place] [address] Phone: [phone] Fax: [phone]     Has the prescription been filled recently? No  Is the patient out of the medication? Yes  Has the patient been seen for an appointment in the last year OR does the patient have an upcoming appointment? Yes  Can we respond through MyChart? No  Please assist patient further as he was instructed for awhile to take just 1/2 a pill and the pharmacy states that is what they have on record even though the one I see says 1 pill daily. He states the pharmacy is requesting a new script stating 1 pill daily not a 1/2 a pill

## 2023-10-12 NOTE — Telephone Encounter (Signed)
 This patient reports taking lovastain 1/2 pill for a while, not sure who advised this. I noted in the Cardiology note on 09/23/23 lovastain 20 mg take 0.5 tab daily. Pharmacy will need a new Rx reflecting this change.

## 2023-10-14 DIAGNOSIS — M5416 Radiculopathy, lumbar region: Secondary | ICD-10-CM | POA: Diagnosis not present

## 2023-10-14 DIAGNOSIS — M5126 Other intervertebral disc displacement, lumbar region: Secondary | ICD-10-CM | POA: Diagnosis not present

## 2023-10-14 DIAGNOSIS — Z79899 Other long term (current) drug therapy: Secondary | ICD-10-CM | POA: Diagnosis not present

## 2023-10-14 DIAGNOSIS — M48061 Spinal stenosis, lumbar region without neurogenic claudication: Secondary | ICD-10-CM | POA: Diagnosis not present

## 2023-10-28 ENCOUNTER — Encounter: Payer: Self-pay | Admitting: Family Medicine

## 2023-10-28 ENCOUNTER — Ambulatory Visit (INDEPENDENT_AMBULATORY_CARE_PROVIDER_SITE_OTHER): Payer: Self-pay | Admitting: Family Medicine

## 2023-10-28 VITALS — BP 93/58 | HR 94 | Ht 74.0 in | Wt 244.0 lb

## 2023-10-28 DIAGNOSIS — D631 Anemia in chronic kidney disease: Secondary | ICD-10-CM | POA: Diagnosis not present

## 2023-10-28 DIAGNOSIS — N184 Chronic kidney disease, stage 4 (severe): Secondary | ICD-10-CM

## 2023-10-28 DIAGNOSIS — I70203 Unspecified atherosclerosis of native arteries of extremities, bilateral legs: Secondary | ICD-10-CM

## 2023-10-28 DIAGNOSIS — E782 Mixed hyperlipidemia: Secondary | ICD-10-CM

## 2023-10-28 DIAGNOSIS — E039 Hypothyroidism, unspecified: Secondary | ICD-10-CM

## 2023-10-28 DIAGNOSIS — I4821 Permanent atrial fibrillation: Secondary | ICD-10-CM | POA: Diagnosis not present

## 2023-10-28 DIAGNOSIS — I10 Essential (primary) hypertension: Secondary | ICD-10-CM | POA: Diagnosis not present

## 2023-10-28 DIAGNOSIS — N1832 Chronic kidney disease, stage 3b: Secondary | ICD-10-CM

## 2023-10-28 DIAGNOSIS — I5040 Unspecified combined systolic (congestive) and diastolic (congestive) heart failure: Secondary | ICD-10-CM | POA: Diagnosis not present

## 2023-10-28 DIAGNOSIS — M5416 Radiculopathy, lumbar region: Secondary | ICD-10-CM

## 2023-10-28 DIAGNOSIS — I5022 Chronic systolic (congestive) heart failure: Secondary | ICD-10-CM

## 2023-10-28 MED ORDER — LOVASTATIN 20 MG PO TABS
20.0000 mg | ORAL_TABLET | Freq: Every day | ORAL | 3 refills | Status: DC
Start: 2023-10-28 — End: 2024-02-14

## 2023-10-28 NOTE — Assessment & Plan Note (Signed)
 Chronic  Blood pressure is low at 93/58 mmHg. He feels well despite hypotension. - continue lisinopril  2.5mg  daily

## 2023-10-28 NOTE — Assessment & Plan Note (Signed)
 Chronic kidney disease stage 4 Managed with lisinopril  for renal protection. - Continue lisinopril  2.5 mg daily -f/u with nephrology

## 2023-10-28 NOTE — Assessment & Plan Note (Signed)
 Managed with Plavix . History of stent placement and recent scan showed narrowing. No new surgical interventions planned. - Continue Plavix  75 mg daily - continue f/u with cardiology as scheduled

## 2023-10-28 NOTE — Assessment & Plan Note (Signed)
 chronic Managed with Synthroid . Thyroid  function tests are planned to monitor levels. - Continue Synthroid  100 mcg daily - Order thyroid  function tests

## 2023-10-28 NOTE — Assessment & Plan Note (Signed)
 Chronic systolic heart failure Managed with torsemide  and lisinopril . [facility] to [facility] for specialized care. - Continue torsemide  20 mg twice daily - Follow up with [facility]

## 2023-10-28 NOTE — Assessment & Plan Note (Signed)
 Permanent atrial fibrillation Managed with amiodarone  and Xarelto  for stroke prevention. Cardiology is considering potential changes to amiodarone . - Continue amiodarone  200 mg daily - Continue Xarelto  20 mg daily - Follow up with cardiology as scheduled

## 2023-10-28 NOTE — Assessment & Plan Note (Signed)
 Chronic lumbar radiculopathy Managed with gabapentin . Follows with physical medicine. - Continue gabapentin  300 mg twice daily

## 2023-10-28 NOTE — Progress Notes (Signed)
 Established patient visit   Patient: [name] SHEROD Sr.   DOB: 01/21/1941   83 y.o. Male  MRN: [id number] Visit Date: 10/28/2023  Today's healthcare provider: Mimi Alt, MD   Chief Complaint  Patient presents with   Hypertension    Discuss medication    Subjective     HPI     Hypertension    Additional comments: Discuss medication       Last edited by Bart Lieu, CMA on 10/28/2023  8:36 AM.       Discussed the use of AI scribe software for clinical note transcription with the patient, who gave verbal consent to proceed.  History of Present Illness [name] GRESS Sr. is an 83 year old male with permanent atrial fibrillation and chronic systolic heart failure who presents with hypotension.  He presents with hypotension, with a current blood pressure reading of 93/58 mmHg. Despite the low blood pressure, he feels great and is able to perform daily activities such as getting up, moving around, fixing breakfast, and taking a bath every morning.  He has a history of permanent atrial fibrillation and is on amiodarone  200 mg daily. He is also on Xarelto  20 mg daily for stroke prevention. He is concerned about the duration of amiodarone  use but notes he has been on it for years.  He has chronic systolic heart failure and is on torsemide  20 mg twice daily. He uses a lymph pump twice daily for 30 minutes to manage leg swelling, which is more pronounced in the left leg.  He has a history of primary hypertension and continues lisinopril  2.5 mg daily. He is also on Plavix  75 mg daily for atherosclerotic disease and continues lovastatin  20 mg daily for hyperlipidemia.  He has chronic kidney disease stage four and anemia secondary to CKD. He continues Synthroid  100 mcg daily for hypothyroidism, which also benefits his hypertension and CKD.  He has lumbar radiculopathy and is on gabapentin  300 mg twice daily. He follows with physical medicine for this  condition.  He has had a DNR since 2017 and wants to update it. He also discusses his experience with hospice care and wants hospice involved if his condition worsens.     Past Medical History:  Diagnosis Date   A-fib [facility])    a.) CHA2DS2VASc = 5 (age x2, CHF, HTN, vascular disease history);  b.) rate/rhythm maintained on oral amiodarone ; chronically anticoagulated with rivaroxaban    Allergy    Arthritis    CAD (coronary artery disease) 11/17/2012   a.) LHC 11/17/2012: 30% mLM, 30% mLAD, 30% mLCx, 50%/30% OM1, 30% pRCA - med mgmt   Chronic systolic CHF (congestive heart failure), NYHA class 2 (HCC)    a.) TTE 05/30/2015: EF 40%, glob HK, mild LVH, mild LAE, mild MR/TR; b.) TTE 10/14/2017: EF 40%, glob HK, mild LVH, mod BAE, triv PR, mild MR/TR, G1DD   Complication of anesthesia    a.) hypotension during routine colonoscopy   COPD (chronic obstructive pulmonary disease) (HCC)    DDD (degenerative disc disease), lumbar    Dilated cardiomyopathy (HCC)    a.) LHC 11/17/2012: EF 35%; b.) TTE 05/30/2015: EF 40%; c.) TTE 10/14/2017: EF 40%   GERD (gastroesophageal reflux disease)    History of chicken pox    Hyperlipidemia    Hypertension    Hypothyroidism    Long term current use of amiodarone     Long term current use of anticoagulant    a.) Rivaroxaban   Osteoporosis    Peripheral vascular disease (HCC)    Pre-diabetes    Spinal stenosis of lumbar region without neurogenic claudication    Stage 3b chronic kidney disease (HCC)     Medications: Outpatient Medications Prior to Visit  Medication Sig   amiodarone  (PACERONE ) 200 MG tablet Take 1 tablet (200 mg total) by mouth daily.   clopidogrel  (PLAVIX ) 75 MG tablet Take 1 tablet (75 mg total) by mouth daily.   ferrous sulfate  325 (65 FE) MG tablet Take 1 tablet (325 mg total) by mouth daily with breakfast.   fluticasone  (FLONASE ) 50 MCG/ACT nasal spray Place 2 sprays into both nostrils daily.   gabapentin  (NEURONTIN ) 300 MG  capsule Take 1 capsule (300 mg total) by mouth 2 (two) times daily.   levothyroxine  (SYNTHROID ) 100 MCG tablet TAKE 1 TABLET BY MOUTH DAILY BEFORE BREAKFAST   lisinopril  (ZESTRIL ) 2.5 MG tablet Take 1 tablet (2.5 mg total) by mouth daily.   mupirocin  ointment (BACTROBAN ) 2 % Apply 1 Application topically daily.   naloxone (NARCAN) nasal spray 4 mg/0.1 mL SMARTSIG:Both Nares   torsemide  (DEMADEX ) 20 MG tablet Take 1 tablet (20 mg total) by mouth 2 (two) times daily.   traMADol  (ULTRAM ) 50 MG tablet Take 50 mg by mouth 2 (two) times daily as needed.   XARELTO  20 MG TABS tablet Take 1 tablet (20 mg total) by mouth daily.   [DISCONTINUED] lovastatin  (MEVACOR ) 20 MG tablet Take 1 tablet (20 mg total) by mouth at bedtime.   No facility-administered medications prior to visit.    Review of Systems  Last CBC Lab Results  Component Value Date   WBC 9.2 01/27/2023   HGB 7.9 (L) 01/27/2023   HCT 25.9 (L) 01/27/2023   MCV 83 01/27/2023   MCH 25.2 (L) 01/27/2023   RDW 16.0 (H) 01/27/2023   PLT 265 01/27/2023   Last metabolic panel Lab Results  Component Value Date   GLUCOSE 97 05/05/2023   NA 139 05/05/2023   K 4.4 05/05/2023   CL 101 05/05/2023   CO2 25 05/05/2023   BUN 24 05/05/2023   CREATININE 2.32 (H) 05/05/2023   EGFR 28 (L) 05/05/2023   CALCIUM 9.1 05/05/2023   PHOS 2.9 06/02/2016   PROT 6.2 04/29/2023   ALBUMIN 3.7 (L) 04/29/2023   LABGLOB 2.5 04/29/2023   AGRATIO 1.5 01/07/2022   BILITOT 0.5 04/29/2023   ALKPHOS 121 04/29/2023   AST 23 04/29/2023   ALT 14 04/29/2023   ANIONGAP 6 10/26/2022   Last lipids Lab Results  Component Value Date   CHOL 152 07/21/2022   HDL 44 07/21/2022   LDLCALC 82 07/21/2022   TRIG 150 (H) 07/21/2022   CHOLHDL 3.5 07/21/2022   The ASCVD Risk score (Arnett DK, et al., 2019) failed to calculate for the following reasons:   The 2019 ASCVD risk score is only valid for ages 13 to 24  Last hemoglobin A1c Lab Results  Component Value Date    HGBA1C 5.5 07/21/2022   Last thyroid  functions Lab Results  Component Value Date   TSH 6.460 (H) 05/05/2023   T4TOTAL 9.0 06/02/2016   Last vitamin D No results found for: "25OHVITD2", "25OHVITD3", "VD25OH" Last vitamin B12 and Folate No results found for: "VITAMINB12", "FOLATE"      Objective    BP (!) 93/58   Pulse 94   Ht 6\' 2"  (1.88 m)   Wt 244 lb (110.7 kg)   SpO2 99%   BMI 31.33 kg/m  BP Readings  from Last 3 Encounters:  10/28/23 (!) 93/58  09/03/23 106/68  04/29/23 (!) 109/34   Wt Readings from Last 3 Encounters:  10/28/23 244 lb (110.7 kg)  09/03/23 250 lb (113.4 kg)  04/29/23 250 lb (113.4 kg)        Physical Exam Vitals reviewed.  Constitutional:      General: He is not in acute distress.    Appearance: Normal appearance. He is not ill-appearing, toxic-appearing or diaphoretic.  Eyes:     Conjunctiva/sclera: Conjunctivae normal.  Cardiovascular:     Rate and Rhythm: Normal rate. Rhythm irregular.     Pulses: Normal pulses.     Heart sounds: Normal heart sounds. No murmur heard.    No friction rub. No gallop.  Pulmonary:     Effort: Pulmonary effort is normal. No respiratory distress.     Breath sounds: Normal breath sounds. No stridor. No wheezing, rhonchi or rales.  Abdominal:     General: Bowel sounds are normal. There is no distension.     Palpations: Abdomen is soft.     Tenderness: There is no abdominal tenderness.  Musculoskeletal:     Right lower leg: Edema present.     Left lower leg: Edema present.  Skin:    Findings: No erythema or rash.  Neurological:     Mental Status: He is alert and oriented to person, place, and time.  Psychiatric:        Mood and Affect: Mood and affect normal.        Speech: Speech normal.        Behavior: Behavior normal. Behavior is cooperative.       No results found for any visits on 10/28/23.  Assessment & Plan     Problem List Items Addressed This Visit       Cardiovascular and  Mediastinum   Permanent atrial fibrillation (HCC) - Primary   Permanent atrial fibrillation Managed with amiodarone  and Xarelto  for stroke prevention. Cardiology is considering potential changes to amiodarone . - Continue amiodarone  200 mg daily - Continue Xarelto  20 mg daily - Follow up with cardiology as scheduled      Relevant Medications   lovastatin  (MEVACOR ) 20 MG tablet   Hypertension   Chronic  Blood pressure is low at 93/58 mmHg. He feels well despite hypotension. - continue lisinopril  2.5mg  daily       Relevant Medications   lovastatin  (MEVACOR ) 20 MG tablet   Other Relevant Orders   BMP8+EGFR   Chronic systolic heart failure (HCC)   Relevant Medications   lovastatin  (MEVACOR ) 20 MG tablet   CHF (congestive heart failure), NYHA class II, unspecified failure chronicity, combined (HCC)   Chronic systolic heart failure Managed with torsemide  and lisinopril . [facility] specialized care. - Continue torsemide  20 mg twice daily - Follow up with [facility]      Relevant Medications   lovastatin  (MEVACOR ) 20 MG tablet   Atherosclerosis of native artery of both lower extremities (HCC)   Managed with Plavix . History of stent placement and recent scan showed narrowing. No new surgical interventions planned. - Continue Plavix  75 mg daily - continue f/u with cardiology as scheduled       Relevant Medications   lovastatin  (MEVACOR ) 20 MG tablet     Endocrine   Hypothyroidism   chronic Managed with Synthroid . Thyroid  function tests are planned to monitor levels. - Continue Synthroid  100 mcg daily - Order thyroid  function tests  Relevant Orders   TSH+T4F+T3Free     Nervous and Auditory   Lumbar radiculopathy, chronic   Chronic lumbar radiculopathy Managed with gabapentin . Follows with physical medicine. - Continue gabapentin  300 mg twice daily        Genitourinary   Stage 4 chronic kidney disease (HCC)    Chronic kidney disease stage 4 Managed with lisinopril  for renal protection. - Continue lisinopril  2.5 mg daily -f/u with nephrology       Anemia due to stage 3b chronic kidney disease (HCC)   Other Visit Diagnoses       Mixed hyperlipidemia       Relevant Medications   lovastatin  (MEVACOR ) 20 MG tablet       Assessment & Plan    Dilated cardiomyopathy Under cardiology care.    Anemia in CKD Secondary to CKD.  Hyperlipidemia Managed with lovastatin . - Continue lovastatin  20 mg daily   Goals of Care He has a DNR from 2017 and wishes to update it. Expressed desire for hospice care if condition worsens. Values quality of life and has discussed deprescribing with healthcare providers. - Update DNR and ensure it is on file - Discuss hospice care options if condition worsens     Return in about 6 months (around 04/28/2024) for CHRONIC F/U.         Mimi Alt, MD  [facility] [phone] (phone) [phone] (fax)  [facility]

## 2023-10-29 DIAGNOSIS — H2511 Age-related nuclear cataract, right eye: Secondary | ICD-10-CM | POA: Diagnosis not present

## 2023-10-29 DIAGNOSIS — M3501 Sicca syndrome with keratoconjunctivitis: Secondary | ICD-10-CM | POA: Diagnosis not present

## 2023-10-29 DIAGNOSIS — H2512 Age-related nuclear cataract, left eye: Secondary | ICD-10-CM | POA: Diagnosis not present

## 2023-10-29 DIAGNOSIS — H35039 Hypertensive retinopathy, unspecified eye: Secondary | ICD-10-CM | POA: Diagnosis not present

## 2023-10-29 DIAGNOSIS — H40003 Preglaucoma, unspecified, bilateral: Secondary | ICD-10-CM | POA: Diagnosis not present

## 2023-10-29 LAB — BMP8+EGFR
BUN/Creatinine Ratio: 11 (ref 10–24)
BUN: 27 mg/dL (ref 8–27)
CO2: 23 mmol/L (ref 20–29)
Calcium: 9 mg/dL (ref 8.6–10.2)
Chloride: 99 mmol/L (ref 96–106)
Creatinine, Ser: 2.43 mg/dL — ABNORMAL HIGH (ref 0.76–1.27)
Glucose: 119 mg/dL — ABNORMAL HIGH (ref 70–99)
Potassium: 3.8 mmol/L (ref 3.5–5.2)
Sodium: 139 mmol/L (ref 134–144)
eGFR: 26 mL/min/{1.73_m2} — ABNORMAL LOW (ref >59)

## 2023-10-29 LAB — TSH+T4F+T3FREE
Free T4: 1.41 ng/dL (ref 0.82–1.77)
T3, Free: 2.1 pg/mL (ref 2.0–4.4)
TSH: 6.16 u[IU]/mL — ABNORMAL HIGH (ref 0.450–4.500)

## 2023-11-13 NOTE — Discharge Instructions (Signed)

## 2023-11-16 DIAGNOSIS — H2513 Age-related nuclear cataract, bilateral: Secondary | ICD-10-CM | POA: Diagnosis not present

## 2023-11-16 DIAGNOSIS — H2511 Age-related nuclear cataract, right eye: Secondary | ICD-10-CM | POA: Diagnosis not present

## 2023-11-17 ENCOUNTER — Encounter: Payer: Self-pay | Admitting: Ophthalmology

## 2023-11-17 NOTE — Anesthesia Preprocedure Evaluation (Addendum)
 Anesthesia Evaluation  Patient identified by MRN, date of birth, ID band Patient awake    Reviewed: Allergy & Precautions, H&P , NPO status , Patient's Chart, lab work & pertinent test results  History of Anesthesia Complications (+) history of anesthetic complications  Airway Mallampati: III  TM Distance: >3 FB Neck ROM: Full    Dental no notable dental hx. (+) Upper Dentures, Lower Dentures Dentures stay in poorly so will remove preop:   Pulmonary COPD, former smoker   Pulmonary exam normal breath sounds clear to auscultation       Cardiovascular hypertension, + CAD, + Peripheral Vascular Disease and +CHF  Normal cardiovascular exam Rhythm:Irregular Rate:Normal  10-14-17 echo _____________________ Permanent atrial fibrillation____________________________________________________________________  INTERPRETATION  MILD LV SYSTOLIC DYSFUNCTION WITH AN ESTIMATED EF = 40 %  NORMAL RIGHT VENTRICULAR SYSTOLIC FUNCTION  MILD TRICUSPID AND MITRAL VALVE INSUFFICIENCY  NO VALVULAR STENOSIS  MODERATE BIATRIAL ENLARGEMENT  MILD RV ENLARGEMENT  MILD LVH              Dissection: INDETERM FOR DISSECTION  AORTIC VALVE               Leaflets: Tricuspid                   Morphology: MILDLY THICKENED               Mobility: Fully mobile  LEFT VENTRICLE                   Size: Normal                        Anterior: HYPOCONTRACTILE            Contraction: MILD GLOBAL DECREASE           Lateral: HYPOCONTRACTILE             Closest EF: 40% (Estimated)                 Septal: HYPOCONTRACTILE              LV Masses: No Masses                       Apical: HYPOCONTRACTILE                    LVH: MILD LVH                      Inferior: HYPOCONTRACTILE                                                      Posterior: HYPOCONTRACTILE           Dias.FxClass: (Grade 1) relaxation abnormal, E/A reversal  MITRAL VALVE   EKG today, atrial fibrillation,  LAD, LBBB   Neuro/Psych  Neuromuscular disease negative neurological ROS  negative psych ROS   GI/Hepatic negative GI ROS, Neg liver ROS,GERD  ,,  Endo/Other  negative endocrine ROSHypothyroidism Hyperthyroidism   Renal/GU Renal diseasenegative Renal ROS  negative genitourinary   Musculoskeletal negative musculoskeletal ROS (+) Arthritis ,    Abdominal   Peds negative pediatric ROS (+)  Hematology negative hematology ROS (+) Blood dyscrasia, anemia   Anesthesia Other Findings  Allergy Hyperlipidemia  Hypertension Complication of anesthesia--hypotension  during routine colonoscopy Peripheral vascular disease  permanent A-fib   Dilated non-ischemic cardiomyopathy (HCC) Chronic systolic CHF (congestive heart failure),  NYHA class 2 (HCC)  Stage 3b chronic kidney disease (HCC) COPD (chronic obstructive pulmonary disease) (HCC)  Arthritis Osteoporosis  Pre-diabetes GERD (gastroesophageal reflux disease) Hypothyroidism CAD (coronary artery disease)  Long term current use of amiodarone  Long term current use of anticoagulant DDD (degenerative disc disease), lumbar Spinal stenosis of lumbar region without neurogenic claudication  Wears dentures Grade I diastolic dysfunction    Reproductive/Obstetrics negative OB ROS                             Anesthesia Physical Anesthesia Plan  ASA: 4  Anesthesia Plan: MAC   Post-op Pain Management:    Induction: Intravenous  PONV Risk Score and Plan:   Airway Management Planned: Natural Airway and Nasal Cannula  Additional Equipment:   Intra-op Plan:   Post-operative Plan:   Informed Consent: I have reviewed the patients History and Physical, chart, labs and discussed the procedure including the risks, benefits and alternatives for the proposed anesthesia with the patient or authorized representative who has indicated his/her understanding and acceptance.     Dental Advisory Given  Plan  Discussed with: Anesthesiologist, CRNA and Surgeon  Anesthesia Plan Comments: (Patient consented for risks of anesthesia including but not limited to:  - adverse reactions to medications - damage to eyes, teeth, lips or other oral mucosa - nerve damage due to positioning  - sore throat or hoarseness - Damage to heart, brain, nerves, lungs, other parts of body or loss of life  Patient voiced understanding and assent.)        Anesthesia Quick Evaluation

## 2023-11-24 ENCOUNTER — Encounter
Admission: RE | Disposition: A | Discharge status: Home / Self Care | Payer: Self-pay | Source: Home / Self Care | Attending: Ophthalmology

## 2023-11-24 ENCOUNTER — Ambulatory Visit: Payer: Self-pay | Admitting: Anesthesiology

## 2023-11-24 ENCOUNTER — Ambulatory Visit
Admission: RE | Admit: 2023-11-24 | Discharge: 2023-11-24 | Disposition: A | Discharge status: Home / Self Care | Attending: Ophthalmology | Admitting: Ophthalmology

## 2023-11-24 ENCOUNTER — Other Ambulatory Visit: Payer: Self-pay

## 2023-11-24 ENCOUNTER — Encounter: Payer: Self-pay | Admitting: Ophthalmology

## 2023-11-24 DIAGNOSIS — I130 Hypertensive heart and chronic kidney disease with heart failure and stage 1 through stage 4 chronic kidney disease, or unspecified chronic kidney disease: Secondary | ICD-10-CM | POA: Diagnosis not present

## 2023-11-24 DIAGNOSIS — Z87891 Personal history of nicotine dependence: Secondary | ICD-10-CM | POA: Insufficient documentation

## 2023-11-24 DIAGNOSIS — H2512 Age-related nuclear cataract, left eye: Secondary | ICD-10-CM | POA: Insufficient documentation

## 2023-11-24 DIAGNOSIS — I081 Rheumatic disorders of both mitral and tricuspid valves: Secondary | ICD-10-CM | POA: Insufficient documentation

## 2023-11-24 DIAGNOSIS — N1832 Chronic kidney disease, stage 3b: Secondary | ICD-10-CM | POA: Insufficient documentation

## 2023-11-24 DIAGNOSIS — I2510 Atherosclerotic heart disease of native coronary artery without angina pectoris: Secondary | ICD-10-CM | POA: Insufficient documentation

## 2023-11-24 DIAGNOSIS — I739 Peripheral vascular disease, unspecified: Secondary | ICD-10-CM | POA: Insufficient documentation

## 2023-11-24 DIAGNOSIS — I420 Dilated cardiomyopathy: Secondary | ICD-10-CM | POA: Insufficient documentation

## 2023-11-24 DIAGNOSIS — Z79899 Other long term (current) drug therapy: Secondary | ICD-10-CM | POA: Insufficient documentation

## 2023-11-24 DIAGNOSIS — E785 Hyperlipidemia, unspecified: Secondary | ICD-10-CM | POA: Diagnosis not present

## 2023-11-24 DIAGNOSIS — K219 Gastro-esophageal reflux disease without esophagitis: Secondary | ICD-10-CM | POA: Insufficient documentation

## 2023-11-24 DIAGNOSIS — I5022 Chronic systolic (congestive) heart failure: Secondary | ICD-10-CM | POA: Diagnosis not present

## 2023-11-24 DIAGNOSIS — J449 Chronic obstructive pulmonary disease, unspecified: Secondary | ICD-10-CM | POA: Insufficient documentation

## 2023-11-24 DIAGNOSIS — M1990 Unspecified osteoarthritis, unspecified site: Secondary | ICD-10-CM | POA: Diagnosis not present

## 2023-11-24 DIAGNOSIS — Z7901 Long term (current) use of anticoagulants: Secondary | ICD-10-CM | POA: Diagnosis not present

## 2023-11-24 DIAGNOSIS — E039 Hypothyroidism, unspecified: Secondary | ICD-10-CM | POA: Insufficient documentation

## 2023-11-24 DIAGNOSIS — Z7902 Long term (current) use of antithrombotics/antiplatelets: Secondary | ICD-10-CM | POA: Insufficient documentation

## 2023-11-24 DIAGNOSIS — D631 Anemia in chronic kidney disease: Secondary | ICD-10-CM | POA: Insufficient documentation

## 2023-11-24 DIAGNOSIS — I447 Left bundle-branch block, unspecified: Secondary | ICD-10-CM | POA: Insufficient documentation

## 2023-11-24 DIAGNOSIS — Z79890 Hormone replacement therapy (postmenopausal): Secondary | ICD-10-CM | POA: Diagnosis not present

## 2023-11-24 DIAGNOSIS — I4821 Permanent atrial fibrillation: Secondary | ICD-10-CM | POA: Diagnosis not present

## 2023-11-24 DIAGNOSIS — I4811 Longstanding persistent atrial fibrillation: Secondary | ICD-10-CM | POA: Diagnosis not present

## 2023-11-24 HISTORY — DX: Left bundle-branch block, unspecified: I44.7

## 2023-11-24 HISTORY — DX: Other ill-defined heart diseases: I51.89

## 2023-11-24 HISTORY — PX: CATARACT EXTRACTION W/PHACO: SHX586

## 2023-11-24 HISTORY — DX: Presence of dental prosthetic device (complete) (partial): Z97.2

## 2023-11-24 SURGERY — PHACOEMULSIFICATION, CATARACT, WITH IOL INSERTION
Anesthesia: Monitor Anesthesia Care | Site: Eye | Laterality: Left

## 2023-11-24 MED ORDER — BRIMONIDINE TARTRATE-TIMOLOL 0.2-0.5 % OP SOLN
OPHTHALMIC | Status: DC | PRN
  Administered 2023-11-24: 1 [drp] via OPHTHALMIC

## 2023-11-24 MED ORDER — MIDAZOLAM HCL 2 MG/2ML IJ SOLN
INTRAMUSCULAR | Status: AC
  Filled 2023-11-24: qty 2

## 2023-11-24 MED ORDER — MIDAZOLAM HCL 2 MG/2ML IJ SOLN
INTRAMUSCULAR | Status: DC | PRN
  Administered 2023-11-24: 1 mg via INTRAVENOUS

## 2023-11-24 MED ORDER — ARMC OPHTHALMIC DILATING DROPS
1.0000 | OPHTHALMIC | Status: DC | PRN
  Administered 2023-11-24 (×3): 1 via OPHTHALMIC

## 2023-11-24 MED ORDER — SIGHTPATH DOSE#1 BSS IO SOLN
INTRAOCULAR | Status: DC | PRN
  Administered 2023-11-24: 15 mL via INTRAOCULAR

## 2023-11-24 MED ORDER — FENTANYL CITRATE (PF) 100 MCG/2ML IJ SOLN
INTRAMUSCULAR | Status: DC | PRN
  Administered 2023-11-24: 25 ug via INTRAVENOUS

## 2023-11-24 MED ORDER — SIGHTPATH DOSE#1 NA CHONDROIT SULF-NA HYALURON 40-17 MG/ML IO SOLN
INTRAOCULAR | Status: DC | PRN
Start: 2023-11-24 — End: 2023-11-24
  Administered 2023-11-24: 1 mL via INTRAOCULAR

## 2023-11-24 MED ORDER — SIGHTPATH DOSE#1 BSS IO SOLN
INTRAOCULAR | Status: DC | PRN
  Administered 2023-11-24: 81 mL via OPHTHALMIC

## 2023-11-24 MED ORDER — TETRACAINE HCL 0.5 % OP SOLN
1.0000 [drp] | OPHTHALMIC | Status: DC | PRN
  Administered 2023-11-24 (×3): 1 [drp] via OPHTHALMIC

## 2023-11-24 MED ORDER — LIDOCAINE HCL (PF) 2 % IJ SOLN
INTRAOCULAR | Status: DC | PRN
  Administered 2023-11-24: 2 mL

## 2023-11-24 MED ORDER — ARMC OPHTHALMIC DILATING DROPS
OPHTHALMIC | Status: AC
  Filled 2023-11-24: qty 0.5

## 2023-11-24 MED ORDER — TETRACAINE HCL 0.5 % OP SOLN
OPHTHALMIC | Status: AC
Start: 2023-11-24 — End: ?
  Filled 2023-11-24: qty 4

## 2023-11-24 MED ORDER — SODIUM CHLORIDE 0.9% FLUSH
INTRAVENOUS | Status: DC | PRN
  Administered 2023-11-24: 10 mL via INTRAVENOUS

## 2023-11-24 MED ORDER — MOXIFLOXACIN HCL 0.5 % OP SOLN
OPHTHALMIC | Status: DC | PRN
  Administered 2023-11-24: .2 mL via OPHTHALMIC

## 2023-11-24 MED ORDER — FENTANYL CITRATE (PF) 100 MCG/2ML IJ SOLN
INTRAMUSCULAR | Status: AC
  Filled 2023-11-24: qty 2

## 2023-11-24 SURGICAL SUPPLY — 12 items
CATARACT SUITE SIGHTPATH (MISCELLANEOUS) ×1 IMPLANT
CYSTOTOME ANGL RVRS SHRT 25G (CUTTER) ×1 IMPLANT
CYSTOTOME ANGL RVRS SHRT 25GA (CUTTER) ×1 IMPLANT
FEE CATARACT SUITE SIGHTPATH (MISCELLANEOUS) ×1 IMPLANT
GLOVE BIOGEL PI IND STRL 8 (GLOVE) ×1 IMPLANT
GLOVE SURG LX STRL 8.0 MICRO (GLOVE) ×1 IMPLANT
GLOVE SURG PROTEXIS BL SZ6.5 (GLOVE) ×1 IMPLANT
GLOVE SURG SYN 6.5 PF PI BL (GLOVE) ×1 IMPLANT
LENS IOL TECNIS EYHANCE 20.5 (Intraocular Lens) IMPLANT
NDL FILTER BLUNT 18X1 1/2 (NEEDLE) ×1 IMPLANT
NEEDLE FILTER BLUNT 18X1 1/2 (NEEDLE) ×1 IMPLANT
SYR 3ML LL SCALE MARK (SYRINGE) ×1 IMPLANT

## 2023-11-24 NOTE — Transfer of Care (Signed)
 Immediate Anesthesia Transfer of Care Note  Patient: [name] PATRY Sr.  Procedure(s) Performed: PHACOEMULSIFICATION, CATARACT, WITH IOL INSERTION 29.25 02:17.6 (Left: Eye)  Patient Location: PACU  Anesthesia Type:MAC  Level of Consciousness: awake  Airway & Oxygen Therapy: Patient Spontanous Breathing  Post-op Assessment: Report given to RN and Post -op Vital signs reviewed and stable  Post vital signs: Reviewed and stable  Last Vitals: Temp normal  Vitals Value Taken Time  BP 107/58 11/24/23 1305  Temp    Pulse 93 11/24/23 1307  Resp 18 11/24/23 1307  SpO2 99 % 11/24/23 1307  Vitals shown include unfiled device data.  Last Pain:  Vitals:   11/24/23 1203  TempSrc: Temporal  PainSc: 0-No pain         Complications: No notable events documented.

## 2023-11-24 NOTE — Op Note (Signed)
 PREOPERATIVE DIAGNOSIS:  Nuclear sclerotic cataract of the left eye.   POSTOPERATIVE DIAGNOSIS:  Nuclear sclerotic cataract of the left eye.   OPERATIVE PROCEDURE:ORPROCALL@   SURGEON:  [name], MD.   ANESTHESIA:  Anesthesiologist: Emilie Harden, MD CRNA: Karan Osgood, CRNA  1.      Managed anesthesia care. 2.     0.12ml of Shugarcaine was instilled following the paracentesis   COMPLICATIONS:  None.   TECHNIQUE:   Stop and chop   DESCRIPTION OF PROCEDURE:  The patient was examined and consented in the preoperative holding area where the aforementioned topical anesthesia was applied to the left eye and then brought back to the Operating Room where the left eye was prepped and draped in the usual sterile ophthalmic fashion and a lid speculum was placed. A paracentesis was created with the side port blade and the anterior chamber was filled with viscoelastic. A near clear corneal incision was performed with the steel keratome. A continuous curvilinear capsulorrhexis was performed with a cystotome followed by the capsulorrhexis forceps. Hydrodissection and hydrodelineation were carried out with BSS on a blunt cannula. The lens was removed in a stop and chop  technique and the remaining cortical material was removed with the irrigation-aspiration handpiece. The capsular bag was inflated with viscoelastic and the Technis [id number] lens was placed in the capsular bag without complication. The remaining viscoelastic was removed from the eye with the irrigation-aspiration handpiece. The wounds were hydrated. The anterior chamber was flushed with BSS and the eye was inflated to physiologic pressure. 0.71ml Vigamox was placed in the anterior chamber. The wounds were found to be water tight. The eye was dressed with Combigan. The patient was given protective glasses to wear throughout the day and a shield with which to sleep tonight. The patient was also given drops with which to begin a drop regimen  today and will follow-up with me in one day. Implant Name Type Inv. Item Serial No. Manufacturer Lot No. LRB No. Used Action  LENS IOL TECNIS EYHANCE 20.5 - [id number] Intraocular Lens LENS IOL TECNIS EYHANCE 20.5 [id number] [employer]  Left 1 Implanted    Procedure(s): PHACOEMULSIFICATION, CATARACT, WITH IOL INSERTION 29.25 02:17.6 (Left)  Electronically signed: [name] 11/24/2023 1:04 PM

## 2023-11-24 NOTE — H&P (Signed)
 [facility]   Primary Care Physician:  Mimi Alt, MD Ophthalmologist: Dr. Jeb Miner  Pre-Procedure History & Physical: HPI:  [name] STAGGS Sr. is a 83 y.o. male here for cataract surgery.   Past Medical History:  Diagnosis Date   A-fib [facility])    a.) CHA2DS2VASc = 5 (age x2, CHF, HTN, vascular disease history);  b.) rate/rhythm maintained on oral amiodarone ; chronically anticoagulated with rivaroxaban    Allergy    Arthritis    CAD (coronary artery disease) 11/17/2012   a.) LHC 11/17/2012: 30% mLM, 30% mLAD, 30% mLCx, 50%/30% OM1, 30% pRCA - med mgmt   Chronic systolic CHF (congestive heart failure), NYHA class 2 (HCC)    a.) TTE 05/30/2015: EF 40%, glob HK, mild LVH, mild LAE, mild MR/TR; b.) TTE 10/14/2017: EF 40%, glob HK, mild LVH, mod BAE, triv PR, mild MR/TR, G1DD   Complication of anesthesia    a.) hypotension during routine colonoscopy   COPD (chronic obstructive pulmonary disease) (HCC)    DDD (degenerative disc disease), lumbar    Dilated cardiomyopathy (HCC)    a.) LHC 11/17/2012: EF 35%; b.) TTE 05/30/2015: EF 40%; c.) TTE 10/14/2017: EF 40%   GERD (gastroesophageal reflux disease)    Grade I diastolic dysfunction    History of chicken pox    Hyperlipidemia    Hypertension    Hypothyroidism    Long term current use of amiodarone     Long term current use of anticoagulant    a.) Rivaroxaban    Osteoporosis    Peripheral vascular disease (HCC)    Pre-diabetes    Spinal stenosis of lumbar region without neurogenic claudication    Stage 3b chronic kidney disease (HCC)    Wears dentures    full upper and lower    Past Surgical History:  Procedure Laterality Date   BACK SURGERY     x5   DOPPLER ECHOCARDIOGRAPHY  06/21/2013   EF=35% while in Sinus Rhythm   ENDARTERECTOMY FEMORAL Left 10/22/2022   Procedure: ENDARTERECTOMY FEMORAL;  Surgeon: Jackquelyn Mass, MD;  Location: [facility] ORS;  Service: Vascular;  Laterality: Left;   FEMORAL-TIBIAL  BYPASS GRAFT Left 10/22/2022   Procedure: BYPASS GRAFT FEMORAL-TIBIAL ARTERY;  Surgeon: Jackquelyn Mass, MD;  Location: [facility] ORS;  Service: Vascular;  Laterality: Left;   LEFT HEART CATH AND CORONARY ANGIOGRAPHY Left 11/17/2012   Procedure: LEFT HEART CATHETERIZATION AND CORONARY ANGIOGRAPHY; Location: [facility]; Surgeon: Percival Brace, MD   LOWER EXTREMITY ANGIOGRAPHY Right 05/20/2022   Procedure: Lower Extremity Angiography;  Surgeon: Jackquelyn Mass, MD;  Location: [facility] INVASIVE CV LAB;  Service: Cardiovascular;  Laterality: Right;   LOWER EXTREMITY ANGIOGRAPHY Left 07/15/2022   Procedure: Lower Extremity Angiography;  Surgeon: Jackquelyn Mass, MD;  Location: [facility] INVASIVE CV LAB;  Service: Cardiovascular;  Laterality: Left;   Myocardial perfusion scan  10/28/2012   severe global LV enlargement. Mild RV enlargement. Mild LVH. Mild mitral and tricuspid insufficency   polyp excision  09/28/2005   UPPER GASTROINTESTINAL ENDOSCOPY  03/27/2004   Gastropathy, Gastritis; Duodenopathy    Prior to Admission medications   Medication Sig Start Date End Date Taking? Authorizing Provider  amiodarone  (PACERONE ) 200 MG tablet Take 1 tablet (200 mg total) by mouth daily. 01/27/23  Yes [name], [name], MD  clopidogrel  (PLAVIX ) 75 MG tablet Take 1 tablet (75 mg total) by mouth daily. 10/28/22  Yes Pace, Brien R, NP  ferrous sulfate  325 (65 FE) MG tablet Take 1 tablet (325 mg total) by mouth daily with  breakfast. 01/29/23  Yes [name], [name], MD  fluticasone  (FLONASE ) 50 MCG/ACT nasal spray Place 2 sprays into both nostrils daily. Patient taking differently: Place 2 sprays into both nostrils daily as needed. 10/02/21  Yes [name], FNP  gabapentin  (NEURONTIN ) 300 MG capsule Take 1 capsule (300 mg total) by mouth 2 (two) times daily. 09/24/21  Yes Cephus Collin, MD  levothyroxine  (SYNTHROID ) 100 MCG tablet TAKE 1 TABLET BY MOUTH DAILY BEFORE BREAKFAST 01/27/23  Yes [name],  [name], MD  lisinopril  (ZESTRIL ) 2.5 MG tablet Take 1 tablet (2.5 mg total) by mouth daily. 01/27/23  Yes [name], [name], MD  lovastatin  (MEVACOR ) 20 MG tablet Take 1 tablet (20 mg total) by mouth at bedtime. 10/28/23  Yes [name], [name], MD  naloxone [facility]) nasal spray 4 mg/0.1 mL SMARTSIG:Both Nares 10/27/22  Yes [provider]  torsemide  (DEMADEX ) 20 MG tablet Take 1 tablet (20 mg total) by mouth 2 (two) times daily. 04/29/23  Yes [name], [name], MD  traMADol  (ULTRAM ) 50 MG tablet Take 50 mg by mouth 2 (two) times daily as needed. 09/18/21  Yes [provider]  XARELTO  20 MG TABS tablet Take 1 tablet (20 mg total) by mouth daily. 01/27/23  Yes [name], [name], MD  mupirocin  ointment (BACTROBAN ) 2 % Apply 1 Application topically daily. 11/20/22   Brown, [name], NP    Allergies as of 11/02/2023   (No Known Allergies)    Family History  Problem Relation Age of Onset   Alzheimer's disease Mother    Heart attack Father    Colon cancer Brother     Social History   Socioeconomic History   Marital status: Widowed    Spouse name: Not on file   Number of children: 2   Years of education: Not on file   Highest education level: 12th grade  Occupational History   Occupation: Retired/ Disabled  Tobacco Use   Smoking status: Former    Current packs/day: 0.00    Average packs/day: 2.0 packs/day for 50.0 years (100.0 ttl pk-yrs)    Types: Cigarettes    Start date: 06/30/1957    Quit date: 07/01/2007    Years since quitting: 16.4   Smokeless tobacco: Never  Vaping Use   Vaping status: Never Used  Substance and Sexual Activity   Alcohol use: No    Alcohol/week: 0.0 standard drinks of alcohol   Drug use: No   Sexual activity: Not on file  Other Topics Concern   Not on file  Social History Narrative   Not on file   Social Drivers of Health   Financial Resource Strain: Low Risk  (10/06/2023)   Overall Financial Resource Strain  (CARDIA)    Difficulty of Paying Living Expenses: Not hard at all  Food Insecurity: No Food Insecurity (10/06/2023)   Hunger Vital Sign    Worried About Running Out of Food in the Last Year: Never true    Ran Out of Food in the Last Year: Never true  Transportation Needs: No Transportation Needs (10/06/2023)   PRAPARE - Administrator, Civil Service (Medical): No    Lack of Transportation (Non-Medical): No  Physical Activity: Sufficiently Active (10/06/2023)   Exercise Vital Sign    Days of Exercise per Week: 7 days    Minutes of Exercise per Session: 40 min  Stress: No Stress Concern Present (10/06/2023)   [employer] of Occupational Health - Occupational Stress Questionnaire    Feeling of Stress : Not at all  Social  Connections: Socially Isolated (10/06/2023)   Social Connection and Isolation Panel [NHANES]    Frequency of Communication with Friends and Family: More than three times a week    Frequency of Social Gatherings with Friends and Family: More than three times a week    Attends Religious Services: Never    Database administrator or Organizations: No    Attends Banker Meetings: Never    Marital Status: Widowed  Intimate Partner Violence: Not At Risk (10/06/2023)   Humiliation, Afraid, Rape, and Kick questionnaire    Fear of Current or Ex-Partner: No    Emotionally Abused: No    Physically Abused: No    Sexually Abused: No    Review of Systems: See HPI, otherwise negative ROS  Physical Exam: BP 133/72   Pulse 99   Temp 97.8 F (36.6 C) (Temporal)   Resp 18   Ht 6\' 2"  (1.88 m)   Wt 108 kg   SpO2 100%   BMI 30.57 kg/m  General:   Alert, cooperative. Head:  Normocephalic and atraumatic. Respiratory:  Normal work of breathing. Cardiovascular:  NAD  Impression/Plan: [name] Bannister Sr. is here for cataract surgery.  Risks, benefits, limitations, and alternatives regarding cataract surgery have been reviewed with the patient.  Questions  have been answered.  All parties agreeable.   [name], MD  11/24/2023, 12:37 PM

## 2023-11-24 NOTE — Anesthesia Postprocedure Evaluation (Signed)
 Anesthesia Post Note  Patient: [name] OBRECHT Sr.  Procedure(s) Performed: PHACOEMULSIFICATION, CATARACT, WITH IOL INSERTION 29.25 02:17.6 (Left: Eye)  Patient location during evaluation: PACU Anesthesia Type: MAC Level of consciousness: awake and alert Pain management: pain level controlled Vital Signs Assessment: post-procedure vital signs reviewed and stable Respiratory status: spontaneous breathing, nonlabored ventilation, respiratory function stable and patient connected to nasal cannula oxygen Cardiovascular status: stable and blood pressure returned to baseline Postop Assessment: no apparent nausea or vomiting Anesthetic complications: no   No notable events documented.   Last Vitals:  Vitals:   11/24/23 1306 11/24/23 1311  BP: (!) 107/58 110/72  Pulse: 88 95  Resp: 20 16  Temp: (!) 36.1 C (!) 36.1 C  SpO2: 100% 98%    Last Pain:  Vitals:   11/24/23 1311  TempSrc:   PainSc: 0-No pain                 Taimur Fier C Ashyra Cantin

## 2023-11-25 ENCOUNTER — Encounter: Payer: Self-pay | Admitting: Ophthalmology

## 2023-11-27 NOTE — Anesthesia Preprocedure Evaluation (Addendum)
 Anesthesia Evaluation  Patient identified by MRN, date of birth, ID band Patient awake    Reviewed: Allergy & Precautions, H&P , NPO status , Patient's Chart, lab work & pertinent test results  Airway Mallampati: III  TM Distance: >3 FB Neck ROM: Full    Dental no notable dental hx. (+) Upper Dentures, Lower Dentures  Upper Dentures, Lower Dentures Dentures stay in poorly so will remove preop: :   Pulmonary neg pulmonary ROS, former smoker   Pulmonary exam normal breath sounds clear to auscultation       Cardiovascular hypertension, negative cardio ROS Normal cardiovascular exam Rhythm:Regular Rate:Normal    10-14-17 echo _____________________ Permanent atrial fibrillation____________________________________________________________________  INTERPRETATION  MILD LV SYSTOLIC DYSFUNCTION WITH AN ESTIMATED EF = 40 %  NORMAL RIGHT VENTRICULAR SYSTOLIC FUNCTION  MILD TRICUSPID AND MITRAL VALVE INSUFFICIENCY  NO VALVULAR STENOSIS  MODERATE BIATRIAL ENLARGEMENT  MILD RV ENLARGEMENT  MILD LVH               Dissection: INDETERM FOR DISSECTION  AORTIC VALVE               Leaflets: Tricuspid                   Morphology: MILDLY THICKENED               Mobility: Fully mobile  LEFT VENTRICLE                   Size: Normal                        Anterior: HYPOCONTRACTILE            Contraction: MILD GLOBAL DECREASE           Lateral: HYPOCONTRACTILE             Closest EF: 40% (Estimated)                 Septal: HYPOCONTRACTILE              LV Masses: No Masses                       Apical: HYPOCONTRACTILE                    LVH: MILD LVH                      Inferior: HYPOCONTRACTILE                                                      Posterior: HYPOCONTRACTILE           Dias.FxClass: (Grade 1) relaxation abnormal, E/A reversal  MITRAL VALVE    EKG today, atrial fibrillation, LAD, LBBB    Neuro/Psych negative neurological ROS   negative psych ROS   GI/Hepatic negative GI ROS, Neg liver ROS,,,  Endo/Other  negative endocrine ROS    Renal/GU negative Renal ROS  negative genitourinary   Musculoskeletal negative musculoskeletal ROS (+)    Abdominal   Peds negative pediatric ROS (+)  Hematology negative hematology ROS (+)   Anesthesia Other Findings Previous cataract surgery 11-24-23 Dr. Aldo Amble   allergy Hyperlipidemia  Hypertension Complication of anesthesia--hypotension during routine colonoscopy Peripheral vascular disease  permanent A-fib     Dilated non-ischemic cardiomyopathy (HCC) Chronic systolic CHF (congestive heart failure),  NYHA class 2 (HCC)  Stage 3b chronic kidney disease (HCC) COPD (chronic obstructive pulmonary disease) (HCC)        Arthritis Osteoporosis             Pre-diabetes GERD (gastroesophageal reflux disease)Hypothyroidism CAD (coronary artery disease)        Long term current use of amiodarone  Long term current use of anticoagulantDDD (degenerative disc disease), lumbar Spinal stenosis of lumbar region without neurogenic claudication   Wears dentures Grade I diastolic dysfunction     Reproductive/Obstetrics negative OB ROS                             Anesthesia Physical Anesthesia Plan  ASA: 4  Anesthesia Plan: MAC   Post-op Pain Management:    Induction: Intravenous  PONV Risk Score and Plan:   Airway Management Planned: Natural Airway and Nasal Cannula  Additional Equipment:   Intra-op Plan:   Post-operative Plan:   Informed Consent: I have reviewed the patients History and Physical, chart, labs and discussed the procedure including the risks, benefits and alternatives for the proposed anesthesia with the patient or authorized representative who has indicated his/her understanding and acceptance.     Dental Advisory Given  Plan Discussed with: Anesthesiologist, CRNA and Surgeon  Anesthesia Plan Comments:  (Patient consented for risks of anesthesia including but not limited to:  - adverse reactions to medications - damage to eyes, teeth, lips or other oral mucosa - nerve damage due to positioning  - sore throat or hoarseness - Damage to heart, brain, nerves, lungs, other parts of body or loss of life  Patient voiced understanding and assent.)        Anesthesia Quick Evaluation

## 2023-12-03 NOTE — Discharge Instructions (Signed)

## 2023-12-08 ENCOUNTER — Encounter: Payer: Self-pay | Admitting: Ophthalmology

## 2023-12-08 ENCOUNTER — Encounter
Admission: RE | Disposition: A | Discharge status: Home / Self Care | Payer: Self-pay | Source: Home / Self Care | Attending: Ophthalmology

## 2023-12-08 ENCOUNTER — Ambulatory Visit: Payer: Self-pay | Admitting: Anesthesiology

## 2023-12-08 ENCOUNTER — Ambulatory Visit
Admission: RE | Admit: 2023-12-08 | Discharge: 2023-12-08 | Disposition: A | Discharge status: Home / Self Care | Attending: Ophthalmology | Admitting: Ophthalmology

## 2023-12-08 ENCOUNTER — Other Ambulatory Visit: Payer: Self-pay

## 2023-12-08 DIAGNOSIS — I130 Hypertensive heart and chronic kidney disease with heart failure and stage 1 through stage 4 chronic kidney disease, or unspecified chronic kidney disease: Secondary | ICD-10-CM | POA: Diagnosis not present

## 2023-12-08 DIAGNOSIS — Z87891 Personal history of nicotine dependence: Secondary | ICD-10-CM | POA: Diagnosis not present

## 2023-12-08 DIAGNOSIS — J449 Chronic obstructive pulmonary disease, unspecified: Secondary | ICD-10-CM | POA: Diagnosis not present

## 2023-12-08 DIAGNOSIS — I739 Peripheral vascular disease, unspecified: Secondary | ICD-10-CM | POA: Insufficient documentation

## 2023-12-08 DIAGNOSIS — Z7902 Long term (current) use of antithrombotics/antiplatelets: Secondary | ICD-10-CM | POA: Diagnosis not present

## 2023-12-08 DIAGNOSIS — Z7901 Long term (current) use of anticoagulants: Secondary | ICD-10-CM | POA: Diagnosis not present

## 2023-12-08 DIAGNOSIS — Z79899 Other long term (current) drug therapy: Secondary | ICD-10-CM | POA: Diagnosis not present

## 2023-12-08 DIAGNOSIS — Z79890 Hormone replacement therapy (postmenopausal): Secondary | ICD-10-CM | POA: Diagnosis not present

## 2023-12-08 DIAGNOSIS — I2510 Atherosclerotic heart disease of native coronary artery without angina pectoris: Secondary | ICD-10-CM | POA: Insufficient documentation

## 2023-12-08 DIAGNOSIS — I5022 Chronic systolic (congestive) heart failure: Secondary | ICD-10-CM | POA: Insufficient documentation

## 2023-12-08 DIAGNOSIS — I4891 Unspecified atrial fibrillation: Secondary | ICD-10-CM | POA: Diagnosis not present

## 2023-12-08 DIAGNOSIS — H2511 Age-related nuclear cataract, right eye: Secondary | ICD-10-CM | POA: Insufficient documentation

## 2023-12-08 DIAGNOSIS — I081 Rheumatic disorders of both mitral and tricuspid valves: Secondary | ICD-10-CM | POA: Insufficient documentation

## 2023-12-08 DIAGNOSIS — E039 Hypothyroidism, unspecified: Secondary | ICD-10-CM | POA: Insufficient documentation

## 2023-12-08 DIAGNOSIS — I447 Left bundle-branch block, unspecified: Secondary | ICD-10-CM | POA: Diagnosis not present

## 2023-12-08 DIAGNOSIS — N1832 Chronic kidney disease, stage 3b: Secondary | ICD-10-CM | POA: Diagnosis not present

## 2023-12-08 DIAGNOSIS — R7303 Prediabetes: Secondary | ICD-10-CM | POA: Diagnosis not present

## 2023-12-08 HISTORY — PX: CATARACT EXTRACTION W/PHACO: SHX586

## 2023-12-08 SURGERY — PHACOEMULSIFICATION, CATARACT, WITH IOL INSERTION
Anesthesia: Monitor Anesthesia Care | Site: Eye | Laterality: Right

## 2023-12-08 MED ORDER — TETRACAINE HCL 0.5 % OP SOLN
1.0000 [drp] | OPHTHALMIC | Status: DC | PRN
Start: 2023-12-08 — End: 2023-12-08
  Administered 2023-12-08 (×3): 1 [drp] via OPHTHALMIC

## 2023-12-08 MED ORDER — BRIMONIDINE TARTRATE-TIMOLOL 0.2-0.5 % OP SOLN
OPHTHALMIC | Status: DC | PRN
  Administered 2023-12-08: 1 [drp] via OPHTHALMIC

## 2023-12-08 MED ORDER — TETRACAINE HCL 0.5 % OP SOLN
OPHTHALMIC | Status: AC
  Filled 2023-12-08: qty 4

## 2023-12-08 MED ORDER — SIGHTPATH DOSE#1 BSS IO SOLN
INTRAOCULAR | Status: DC | PRN
  Administered 2023-12-08: 65 mL via OPHTHALMIC

## 2023-12-08 MED ORDER — ARMC OPHTHALMIC DILATING DROPS
OPHTHALMIC | Status: AC
Start: 2023-12-08 — End: ?
  Filled 2023-12-08: qty 0.5

## 2023-12-08 MED ORDER — LIDOCAINE HCL (PF) 2 % IJ SOLN
INTRAOCULAR | Status: DC | PRN
  Administered 2023-12-08: 2 mL

## 2023-12-08 MED ORDER — SIGHTPATH DOSE#1 NA CHONDROIT SULF-NA HYALURON 40-17 MG/ML IO SOLN
INTRAOCULAR | Status: DC | PRN
Start: 2023-12-08 — End: 2023-12-08
  Administered 2023-12-08: 1 mL via INTRAOCULAR

## 2023-12-08 MED ORDER — MOXIFLOXACIN HCL 0.5 % OP SOLN
OPHTHALMIC | Status: DC | PRN
  Administered 2023-12-08: .2 mL via OPHTHALMIC

## 2023-12-08 MED ORDER — MIDAZOLAM HCL 2 MG/2ML IJ SOLN
INTRAMUSCULAR | Status: AC
  Filled 2023-12-08: qty 2

## 2023-12-08 MED ORDER — ARMC OPHTHALMIC DILATING DROPS
1.0000 | OPHTHALMIC | Status: DC | PRN
  Administered 2023-12-08 (×3): 1 via OPHTHALMIC

## 2023-12-08 MED ORDER — MIDAZOLAM HCL 2 MG/2ML IJ SOLN
INTRAMUSCULAR | Status: DC | PRN
  Administered 2023-12-08: 1 mg via INTRAVENOUS

## 2023-12-08 MED ORDER — FENTANYL CITRATE (PF) 100 MCG/2ML IJ SOLN
INTRAMUSCULAR | Status: DC | PRN
  Administered 2023-12-08: 50 ug via INTRAVENOUS

## 2023-12-08 MED ORDER — FENTANYL CITRATE (PF) 100 MCG/2ML IJ SOLN
INTRAMUSCULAR | Status: AC
  Filled 2023-12-08: qty 2

## 2023-12-08 MED ORDER — SIGHTPATH DOSE#1 BSS IO SOLN
INTRAOCULAR | Status: DC | PRN
  Administered 2023-12-08: 15 mL via INTRAOCULAR

## 2023-12-08 SURGICAL SUPPLY — 12 items
CATARACT SUITE SIGHTPATH (MISCELLANEOUS) ×1 IMPLANT
CYSTOTOME ANGL RVRS SHRT 25G (CUTTER) ×1 IMPLANT
CYSTOTOME ANGL RVRS SHRT 25GA (CUTTER) ×1 IMPLANT
FEE CATARACT SUITE SIGHTPATH (MISCELLANEOUS) ×1 IMPLANT
GLOVE BIOGEL PI IND STRL 8 (GLOVE) ×1 IMPLANT
GLOVE SURG LX STRL 8.0 MICRO (GLOVE) ×1 IMPLANT
GLOVE SURG PROTEXIS BL SZ6.5 (GLOVE) ×1 IMPLANT
GLOVE SURG SYN 6.5 PF PI BL (GLOVE) ×1 IMPLANT
LENS IOL TECNIS EYHANCE 21.5 (Intraocular Lens) IMPLANT
NDL FILTER BLUNT 18X1 1/2 (NEEDLE) ×1 IMPLANT
NEEDLE FILTER BLUNT 18X1 1/2 (NEEDLE) ×1 IMPLANT
SYR 3ML LL SCALE MARK (SYRINGE) ×1 IMPLANT

## 2023-12-08 NOTE — Anesthesia Postprocedure Evaluation (Signed)
 Anesthesia Post Note  Patient: [name].  Procedure(s) Performed: PHACOEMULSIFICATION, CATARACT, WITH IOL INSERTION 9.35 01:00.7 (Right: Eye)  Anesthesia Type: MAC Anesthetic complications: no   No notable events documented.   Last Vitals:  Vitals:   12/08/23 0947 12/08/23 0950  BP: (!) 89/59 91/64  Pulse: 96 98  Resp: 15 18  Temp: 36.7 C   SpO2: 96% 96%    Last Pain:  Vitals:   12/08/23 0947  TempSrc:   PainSc: 0-No pain                 Emilie Harden

## 2023-12-08 NOTE — Transfer of Care (Signed)
 Immediate Anesthesia Transfer of Care Note  Patient: [name] CELESTINE Sr.  Procedure(s) Performed: PHACOEMULSIFICATION, CATARACT, WITH IOL INSERTION 9.35 01:00.7 (Right: Eye)  Patient Location: PACU  Anesthesia Type: MAC  Level of Consciousness: awake, alert  and patient cooperative  Airway and Oxygen Therapy: Patient Spontanous Breathing and Patient connected to supplemental oxygen  Post-op Assessment: Post-op Vital signs reviewed, Patient's Cardiovascular Status Stable, Respiratory Function Stable, Patent Airway and No signs of Nausea or vomiting  Post-op Vital Signs: Reviewed and stable  Complications: No notable events documented.

## 2023-12-08 NOTE — Op Note (Signed)
 PREOPERATIVE DIAGNOSIS:  Nuclear sclerotic cataract of the right eye.   POSTOPERATIVE DIAGNOSIS:  Right Eye Cataract   OPERATIVE PROCEDURE:ORPROCALL@   SURGEON:  [name], MD.   ANESTHESIA:  Anesthesiologist: Emilie Harden, MD CRNA: Jahoo, Sonia, CRNA  1.      Managed anesthesia care. 2.      0.62ml of Shugarcaine was instilled in the eye following the paracentesis.   COMPLICATIONS:  None.   TECHNIQUE:   Stop and chop   DESCRIPTION OF PROCEDURE:  The patient was examined and consented in the preoperative holding area where the aforementioned topical anesthesia was applied to the right eye and then brought back to the Operating Room where the right eye was prepped and draped in the usual sterile ophthalmic fashion and a lid speculum was placed. A paracentesis was created with the side port blade and the anterior chamber was filled with viscoelastic. A near clear corneal incision was performed with the steel keratome. A continuous curvilinear capsulorrhexis was performed with a cystotome followed by the capsulorrhexis forceps. Hydrodissection and hydrodelineation were carried out with BSS on a blunt cannula. The lens was removed in a stop and chop  technique and the remaining cortical material was removed with the irrigation-aspiration handpiece. The capsular bag was inflated with viscoelastic and the Technis [id number]  lens was placed in the capsular bag without complication. The remaining viscoelastic was removed from the eye with the irrigation-aspiration handpiece. The wounds were hydrated. The anterior chamber was flushed with BSS and the eye was inflated to physiologic pressure. 0.46ml of Vigamox  was placed in the anterior chamber. The wounds were found to be water tight. The eye was dressed with Combigan . The patient was given protective glasses to wear throughout the day and a shield with which to sleep tonight. The patient was also given drops with which to begin a drop regimen today  and will follow-up with me in one day. Implant Name Type Inv. Item Serial No. Manufacturer Lot No. LRB No. Used Action  LENS IOL TECNIS EYHANCE 21.5 - [id number] Intraocular Lens LENS IOL TECNIS EYHANCE 21.5 [id number] [employer]  Right 1 Implanted   Procedure(s): PHACOEMULSIFICATION, CATARACT, WITH IOL INSERTION 9.35 01:00.7 (Right)  Electronically signed: [name] 12/08/2023 9:41 AM

## 2023-12-08 NOTE — H&P (Signed)
 [facility]   Primary Care Physician:  Mimi Alt, MD Ophthalmologist: Dr. Jeb Miner  Pre-Procedure History & Physical: HPI:  CLAVIN RUHLMAN Sr. is a 83 y.o. male here for cataract surgery.   Past Medical History:  Diagnosis Date   A-fib [facility])    a.) CHA2DS2VASc = 5 (age x2, CHF, HTN, vascular disease history);  b.) rate/rhythm maintained on oral amiodarone ; chronically anticoagulated with rivaroxaban    Allergy    Arthritis    CAD (coronary artery disease) 11/17/2012   a.) LHC 11/17/2012: 30% mLM, 30% mLAD, 30% mLCx, 50%/30% OM1, 30% pRCA - med mgmt   Chronic systolic CHF (congestive heart failure), NYHA class 2 (HCC)    a.) TTE 05/30/2015: EF 40%, glob HK, mild LVH, mild LAE, mild MR/TR; b.) TTE 10/14/2017: EF 40%, glob HK, mild LVH, mod BAE, triv PR, mild MR/TR, G1DD   Complication of anesthesia    a.) hypotension during routine colonoscopy   COPD (chronic obstructive pulmonary disease) (HCC)    DDD (degenerative disc disease), lumbar    Dilated cardiomyopathy (HCC)    a.) LHC 11/17/2012: EF 35%; b.) TTE 05/30/2015: EF 40%; c.) TTE 10/14/2017: EF 40%   GERD (gastroesophageal reflux disease)    Grade I diastolic dysfunction    History of chicken pox    Hyperlipidemia    Hypertension    Hypothyroidism    Left bundle branch block    Long term current use of amiodarone     Long term current use of anticoagulant    a.) Rivaroxaban    Osteoporosis    Peripheral vascular disease (HCC)    Pre-diabetes    Spinal stenosis of lumbar region without neurogenic claudication    Stage 3b chronic kidney disease (HCC)    Wears dentures    full upper and lower    Past Surgical History:  Procedure Laterality Date   BACK SURGERY     x5   CATARACT EXTRACTION W/PHACO Left 11/24/2023   Procedure: PHACOEMULSIFICATION, CATARACT, WITH IOL INSERTION 29.25 02:17.6;  Surgeon: [name] Crews, MD;  Location: [facility];  Service: Ophthalmology;  Laterality: Left;    DOPPLER ECHOCARDIOGRAPHY  06/21/2013   EF=35% while in Sinus Rhythm   ENDARTERECTOMY FEMORAL Left 10/22/2022   Procedure: ENDARTERECTOMY FEMORAL;  Surgeon: Jackquelyn Mass, MD;  Location: [facility];  Service: Vascular;  Laterality: Left;   FEMORAL-TIBIAL BYPASS GRAFT Left 10/22/2022   Procedure: BYPASS GRAFT FEMORAL-TIBIAL ARTERY;  Surgeon: Jackquelyn Mass, MD;  Location: [facility];  Service: Vascular;  Laterality: Left;   LEFT HEART CATH AND CORONARY ANGIOGRAPHY Left 11/17/2012   Procedure: LEFT HEART CATHETERIZATION AND CORONARY ANGIOGRAPHY; Location: [facility]; Surgeon: Percival Brace, MD   LOWER EXTREMITY ANGIOGRAPHY Right 05/20/2022   Procedure: Lower Extremity Angiography;  Surgeon: Jackquelyn Mass, MD;  Location: [facility];  Service: Cardiovascular;  Laterality: Right;   LOWER EXTREMITY ANGIOGRAPHY Left 07/15/2022   Procedure: Lower Extremity Angiography;  Surgeon: Jackquelyn Mass, MD;  Location: [facility] INVASIVE CV LAB;  Service: Cardiovascular;  Laterality: Left;   Myocardial perfusion scan  10/28/2012   severe global LV enlargement. Mild RV enlargement. Mild LVH. Mild mitral and tricuspid insufficency   polyp excision  09/28/2005   UPPER GASTROINTESTINAL ENDOSCOPY  03/27/2004   Gastropathy, Gastritis; Duodenopathy    Prior to Admission medications   Medication Sig Start Date End Date Taking? Authorizing Provider  amiodarone  (PACERONE ) 200 MG tablet Take 1 tablet (200 mg total) by mouth daily. 01/27/23  Yes [name], [name], MD  clopidogrel  (PLAVIX ) 75 MG tablet Take 1 tablet (75 mg total) by mouth daily. 10/28/22  Yes Pace, [name], NP  ferrous sulfate  325 (65 FE) MG tablet Take 1 tablet (325 mg total) by mouth daily with breakfast. 01/29/23  Yes [name], [name], MD  gabapentin  (NEURONTIN ) 300 MG capsule Take 1 capsule (300 mg total) by mouth 2 (two) times daily. 09/24/21  Yes Cephus Collin, MD  levothyroxine  (SYNTHROID ) 100 MCG tablet TAKE 1 TABLET BY  MOUTH DAILY BEFORE BREAKFAST 01/27/23  Yes [name], [name], MD  lisinopril  (ZESTRIL ) 2.5 MG tablet Take 1 tablet (2.5 mg total) by mouth daily. 01/27/23  Yes [name], [name], MD  lovastatin  (MEVACOR ) 20 MG tablet Take 1 tablet (20 mg total) by mouth at bedtime. 10/28/23  Yes [name], [name], MD  mupirocin  ointment (BACTROBAN ) 2 % Apply 1 Application topically daily. 11/20/22  Yes [name], [name], NP  naloxone [facility]) nasal spray 4 mg/0.1 mL SMARTSIG:Both Nares 10/27/22  Yes [provider]  traMADol  (ULTRAM ) 50 MG tablet Take 50 mg by mouth 2 (two) times daily as needed. 09/18/21  Yes [provider]  XARELTO  20 MG TABS tablet Take 1 tablet (20 mg total) by mouth daily. 01/27/23  Yes [name], [name], MD  fluticasone  (FLONASE ) 50 MCG/ACT nasal spray Place 2 sprays into both nostrils daily. Patient taking differently: Place 2 sprays into both nostrils daily as needed. 10/02/21   [name], FNP  torsemide  (DEMADEX ) 20 MG tablet Take 1 tablet (20 mg total) by mouth 2 (two) times daily. 04/29/23   [name], [name], MD    Allergies as of 11/02/2023   (No Known Allergies)    Family History  Problem Relation Age of Onset   Alzheimer's disease Mother    Heart attack Father    Colon cancer Brother     Social History   Socioeconomic History   Marital status: Widowed    Spouse name: Not on file   Number of children: 2   Years of education: Not on file   Highest education level: 12th grade  Occupational History   Occupation: Retired/ Disabled  Tobacco Use   Smoking status: Former    Current packs/day: 0.00    Average packs/day: 2.0 packs/day for 50.0 years (100.0 ttl pk-yrs)    Types: Cigarettes    Start date: 06/30/1957    Quit date: 07/01/2007    Years since quitting: 16.4   Smokeless tobacco: Never  Vaping Use   Vaping status: Never Used  Substance and Sexual Activity   Alcohol use: No    Alcohol/week: 0.0 standard drinks  of alcohol   Drug use: No   Sexual activity: Not on file  Other Topics Concern   Not on file  Social History Narrative   Not on file   Social Drivers of Health   Financial Resource Strain: Low Risk  (10/06/2023)   Overall Financial Resource Strain (CARDIA)    Difficulty of Paying Living Expenses: Not hard at all  Food Insecurity: No Food Insecurity (10/06/2023)   Hunger Vital Sign    Worried About Running Out of Food in the Last Year: Never true    Ran Out of Food in the Last Year: Never true  Transportation Needs: No Transportation Needs (10/06/2023)   PRAPARE - Administrator, Civil Service (Medical): No    Lack of Transportation (Non-Medical): No  Physical Activity: Sufficiently Active (10/06/2023)   Exercise Vital Sign    Days of Exercise per Week: 7 days  Minutes of Exercise per Session: 40 min  Stress: No Stress Concern Present (10/06/2023)   [employer] of Occupational Health - Occupational Stress Questionnaire    Feeling of Stress : Not at all  Social Connections: Socially Isolated (10/06/2023)   Social Connection and Isolation Panel [NHANES]    Frequency of Communication with Friends and Family: More than three times a week    Frequency of Social Gatherings with Friends and Family: More than three times a week    Attends Religious Services: Never    Database administrator or Organizations: No    Attends Banker Meetings: Never    Marital Status: Widowed  Intimate Partner Violence: Not At Risk (10/06/2023)   Humiliation, Afraid, Rape, and Kick questionnaire    Fear of Current or Ex-Partner: No    Emotionally Abused: No    Physically Abused: No    Sexually Abused: No    Review of Systems: See HPI, otherwise negative ROS  Physical Exam: BP 108/68   Pulse 92   Temp 98.2 F (36.8 C) (Temporal)   Resp 13   Ht 6\' 2"  (1.88 m)   Wt 109 kg   SpO2 97%   BMI 30.85 kg/m  General:   Alert, cooperative. Head:  Normocephalic and  atraumatic. Respiratory:  Normal work of breathing. Cardiovascular:  NAD  Impression/Plan: [name] Bannister Sr. is here for cataract surgery.  Risks, benefits, limitations, and alternatives regarding cataract surgery have been reviewed with the patient.  Questions have been answered.  All parties agreeable.   [name] Crews, MD  12/08/2023, 9:16 AM

## 2023-12-21 IMAGING — US US EXTREM LOW VENOUS
1 series · 14 of 24 positions shown · non-contrast
Comparison: None Available.

CLINICAL DATA: 81-year-old male with bilateral LOWER extremity pain
and swelling for 1 week.

EXAM:
Bilateral LOWER EXTREMITY VENOUS DOPPLER ULTRASOUND
TECHNIQUE: Gray-scale sonography with compression, as well as color and duplex
ultrasound, were performed to evaluate the deep venous system(s)
from the level of the common femoral vein through the popliteal and
proximal calf veins.

[Series 1: us venous img lower bilat (dvt) · portal-venous · 14 of 57 slices shown]
[im 1/57]
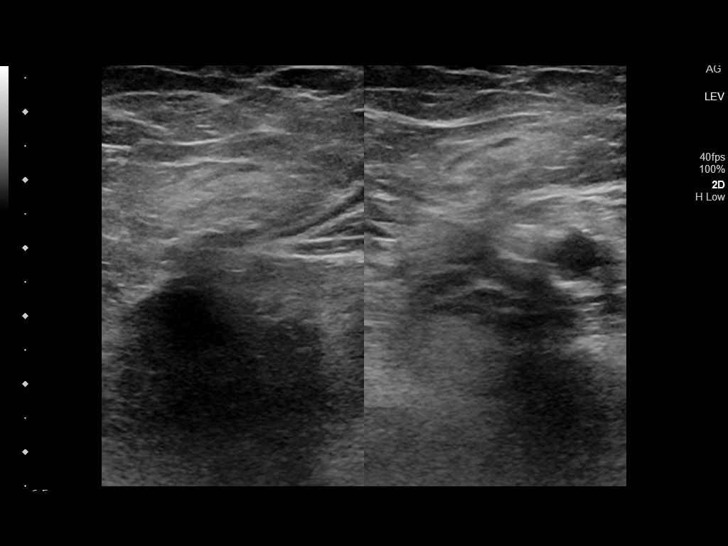
[im 5/57]
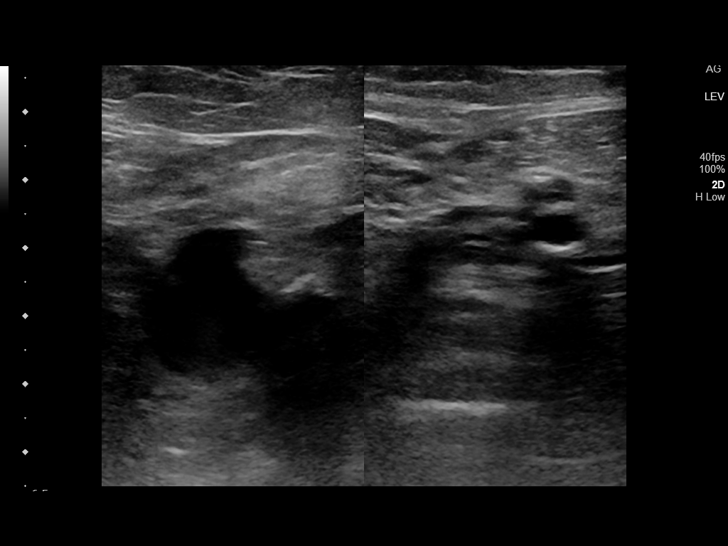
[im 10/57]
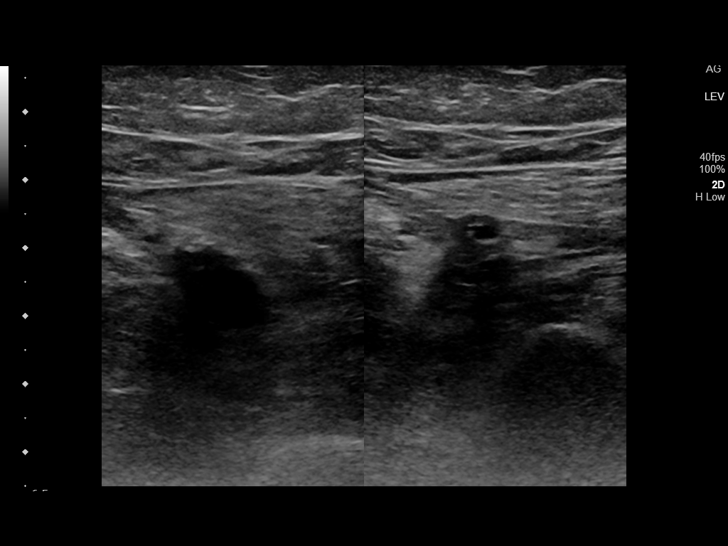
[im 15/57]
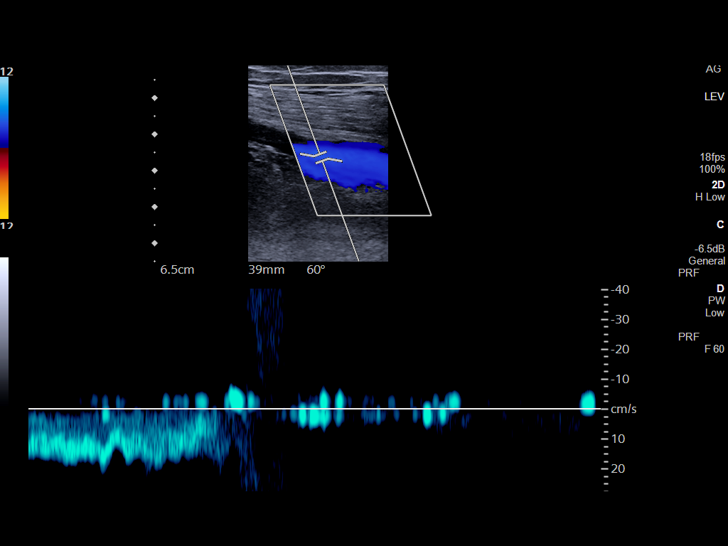
[im 18/57]
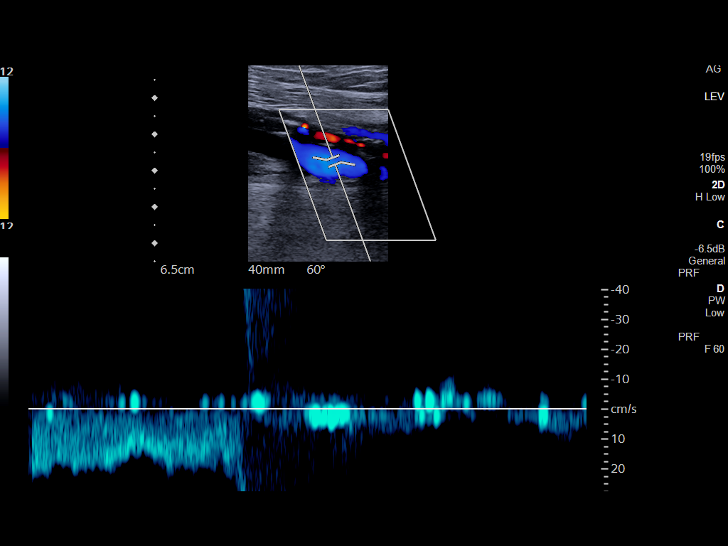
[im 22/57]
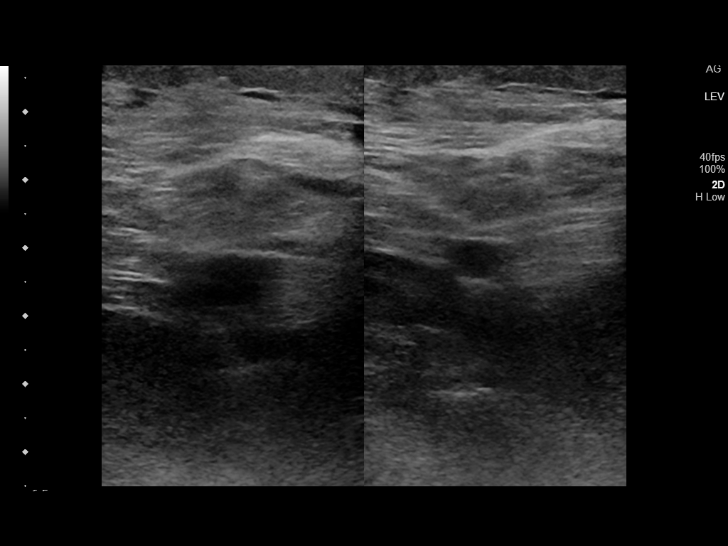
[im 27/57]
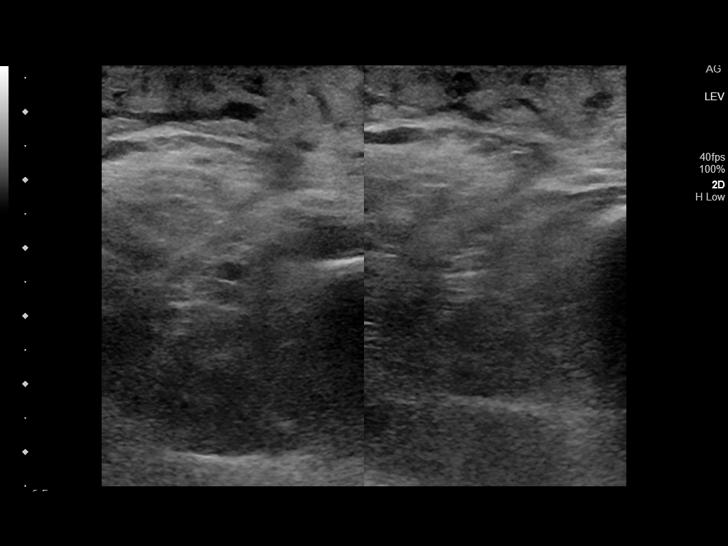
[im 30/57]
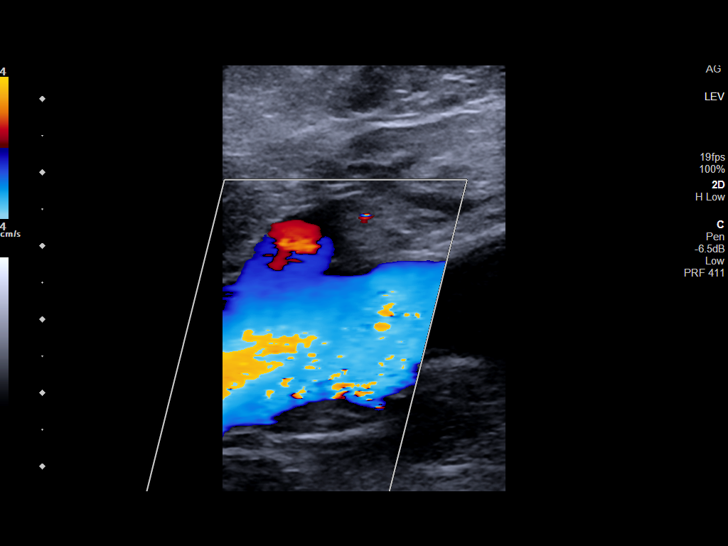
[im 35/57]
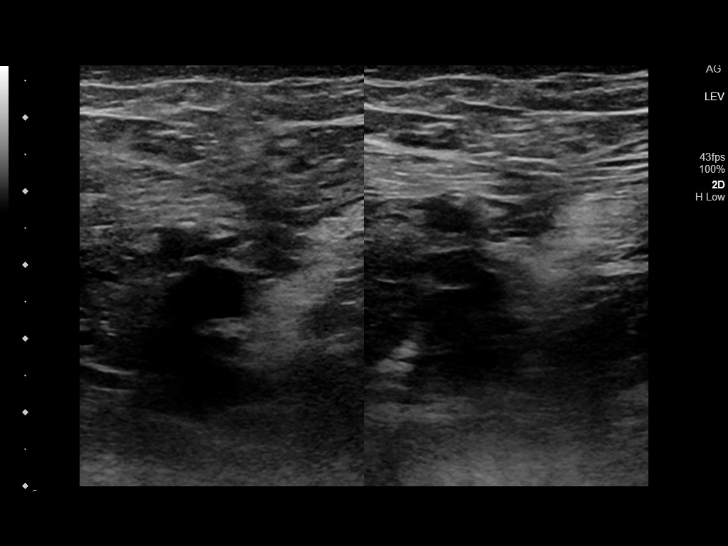
[im 39/57]
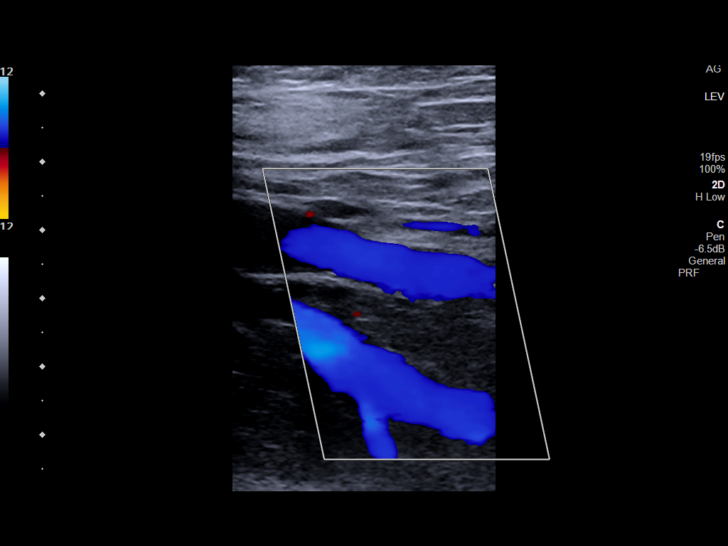
[im 44/57]
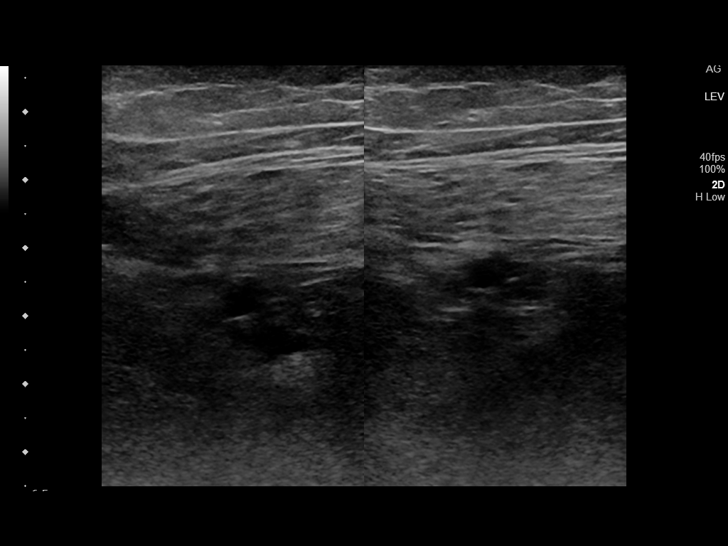
[im 47/57]
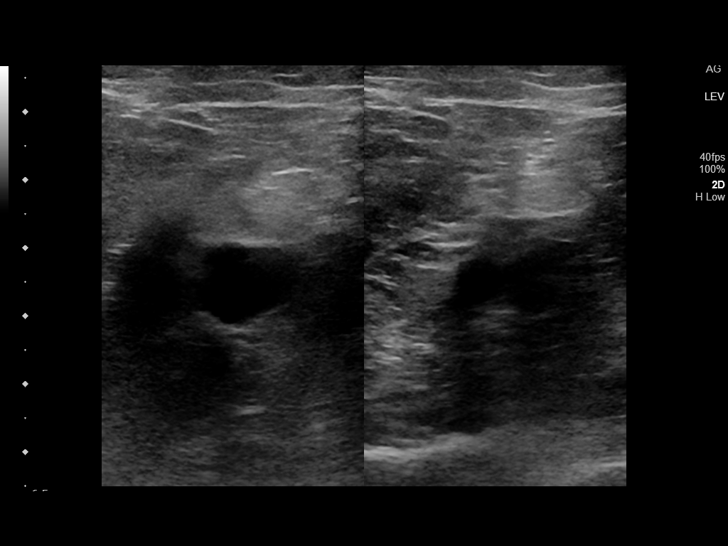
[im 52/57]
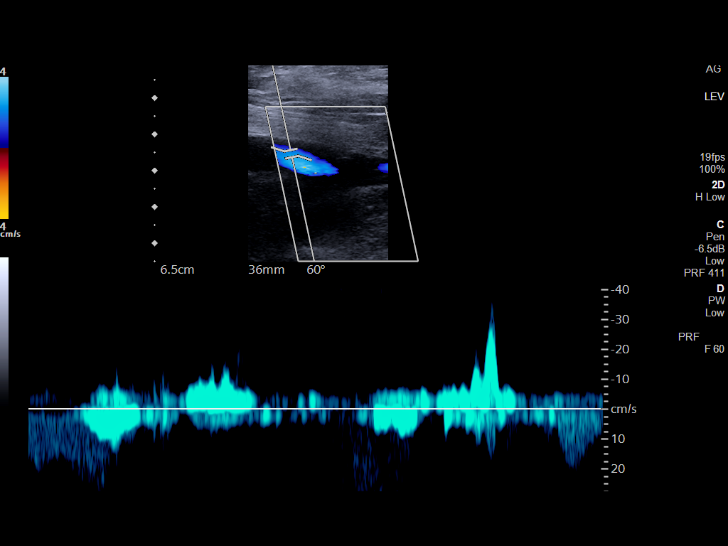
[im 57/57]
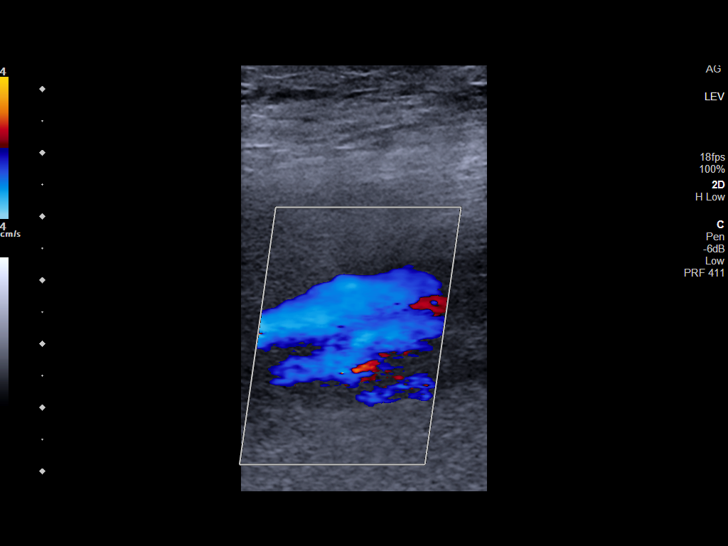

[14 of 24 positions shown; findings below may reference images not displayed]

FINDINGS: VENOUS

Normal compressibility of bilateral common femoral, superficial
femoral, and popliteal veins, as well as the visualized calf veins.
Visualized portions of profunda femoral vein and great saphenous
vein unremarkable. No filling defects to suggest DVT on grayscale or
color Doppler imaging. Doppler waveforms show normal direction of
venous flow, normal respiratory plasticity and response to
augmentation.

OTHER

Mild subcutaneous edema in the LOWER extremities noted.

Limitations: none
IMPRESSION: 1. No evidence of DVT within bilateral LOWER extremities.
2. Mild subcutaneous edema in bilateral LOWER extremities.

## 2023-12-21 IMAGING — CR DG TIBIA/FIBULA 2V*R*
1 series · 4 of 4 positions shown · non-contrast
Comparison: None Available.

CLINICAL DATA: Right leg wound.

EXAM:
RIGHT TIBIA AND FIBULA - 2 VIEW

[Series 1: dg tibia/fibula right · 0.14mm/px · 4 of 4 slices shown]
[im 1/4]
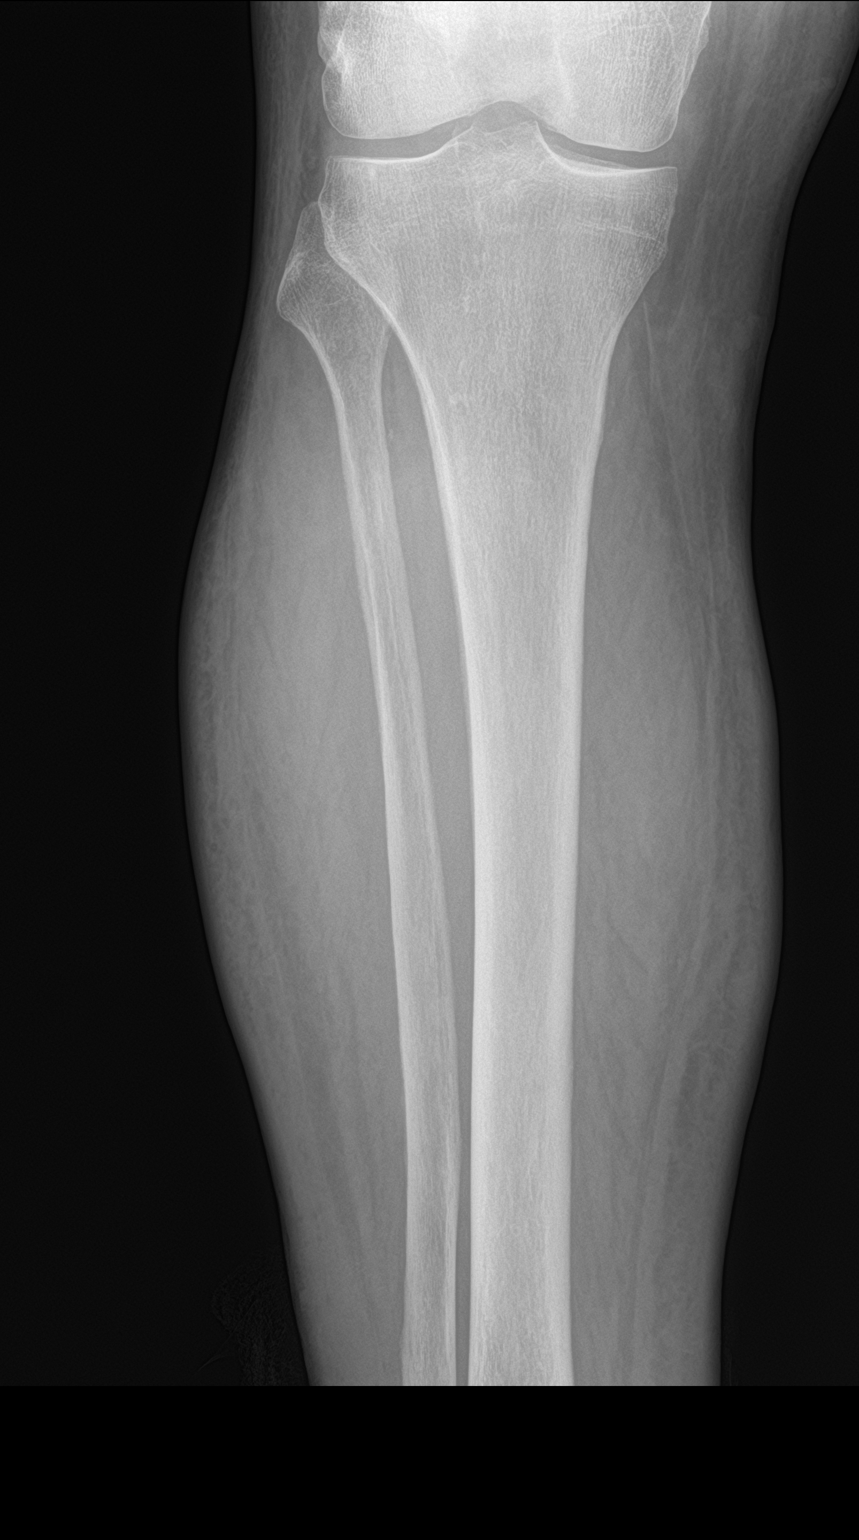
[im 2/4]
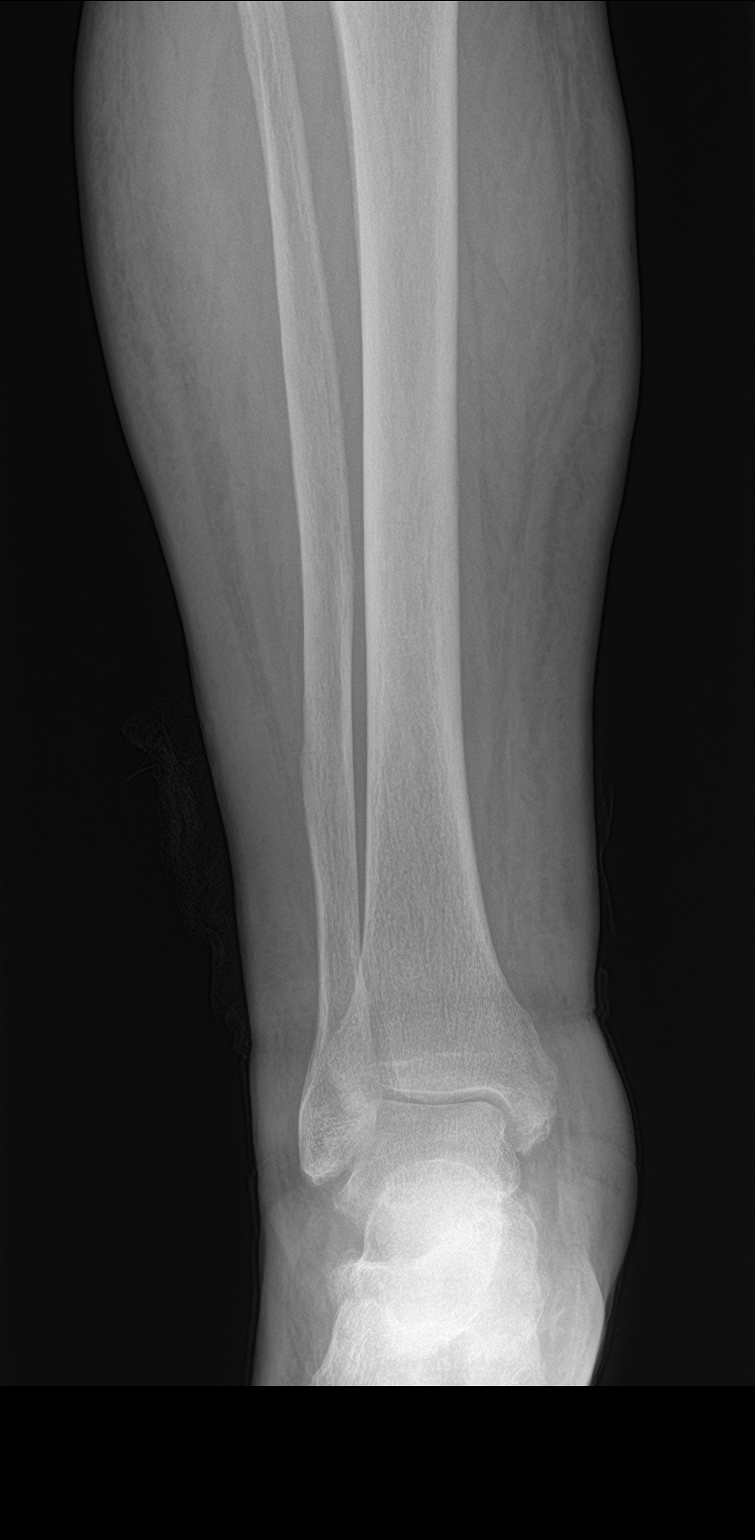
[im 3/4]
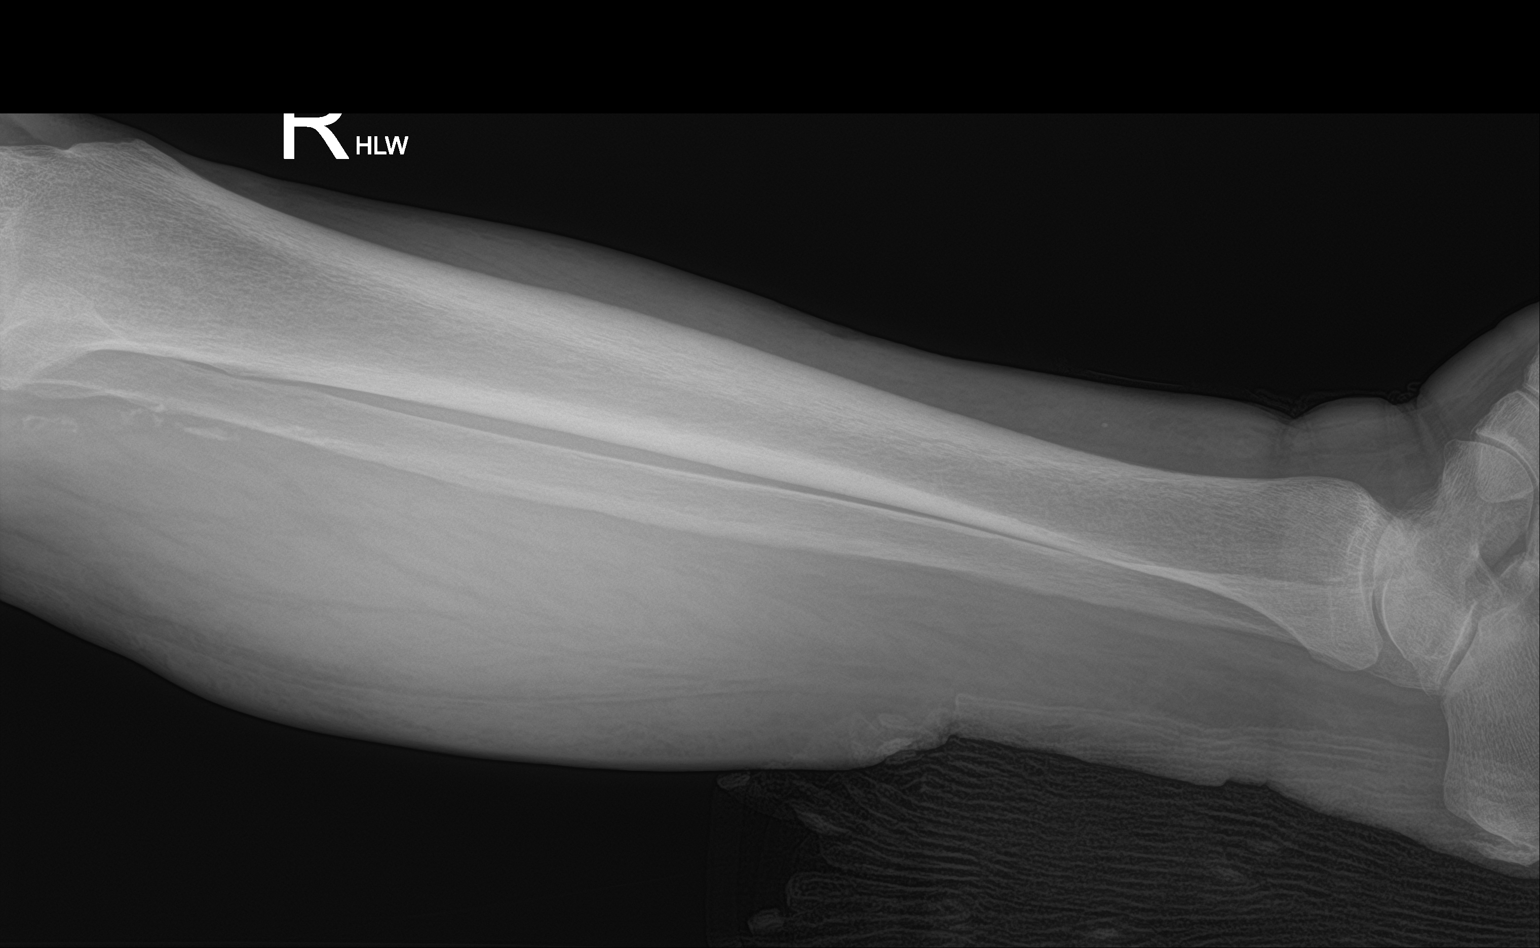
[im 4/4]
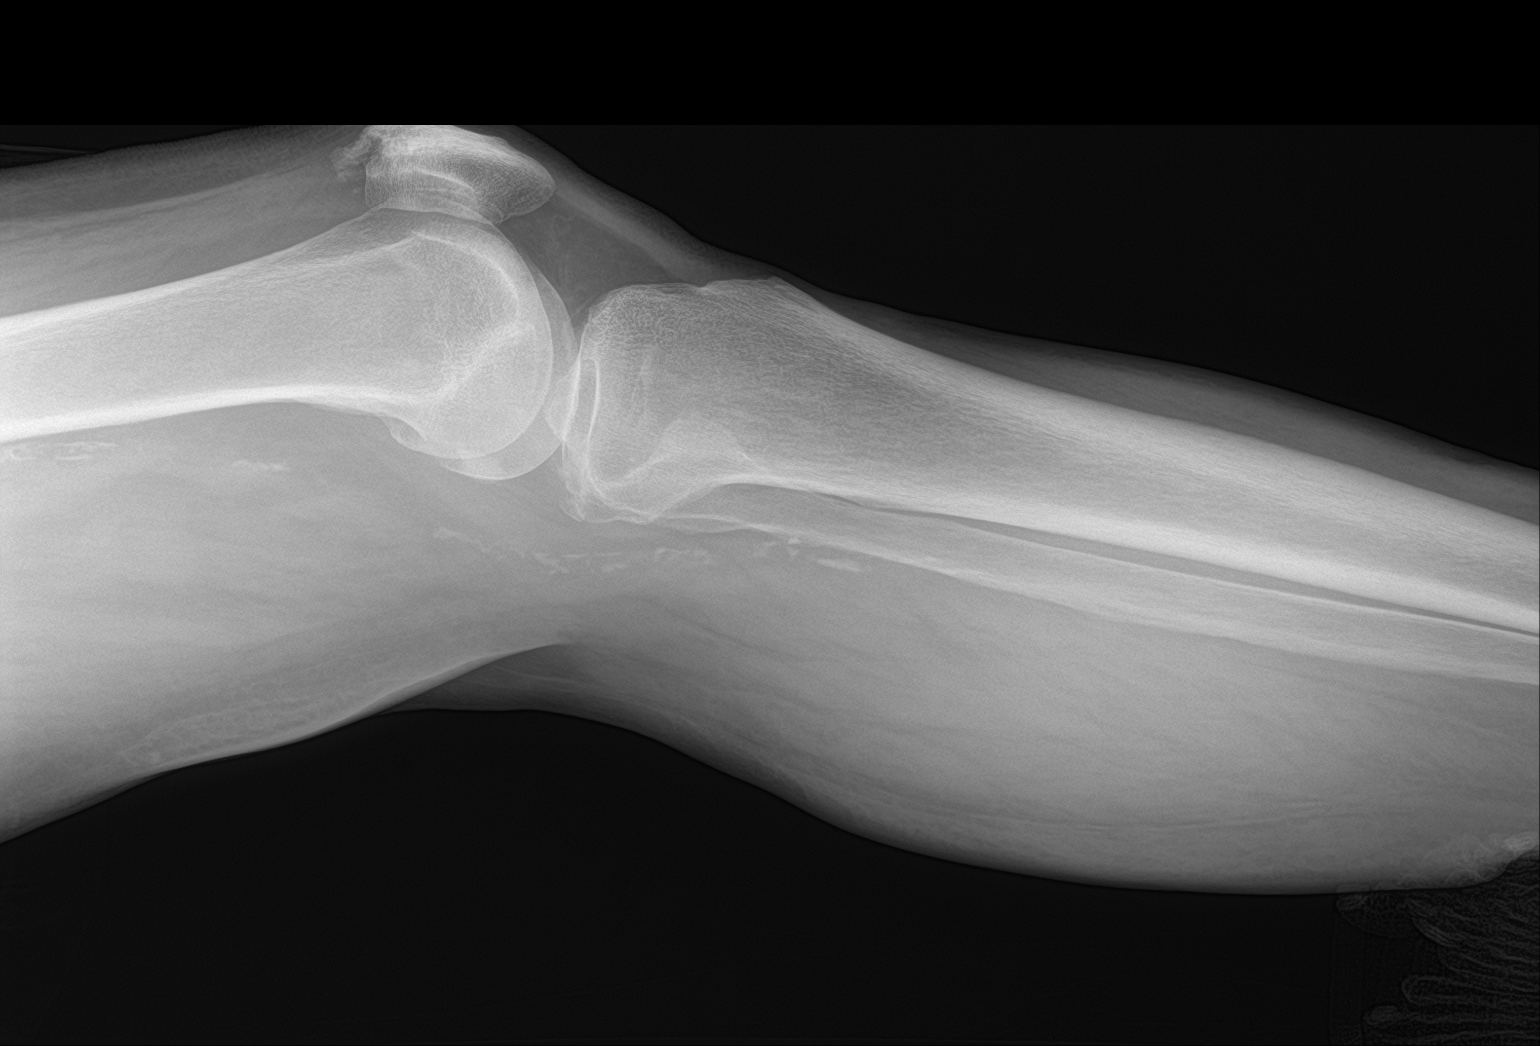

[4 of 4 positions shown; findings below may reference images not displayed]

FINDINGS: There is no evidence of fracture or other focal bone lesions. Soft
tissues are unremarkable.
IMPRESSION: Negative.

## 2023-12-21 IMAGING — CR DG CHEST 1V PORT
1 series · 1 of 1 positions shown · non-contrast
Comparison: None Available.

CLINICAL DATA: Possible sepsis.  Leg swelling.

EXAM:
PORTABLE CHEST 1 VIEW

[dg chest port 1 view]
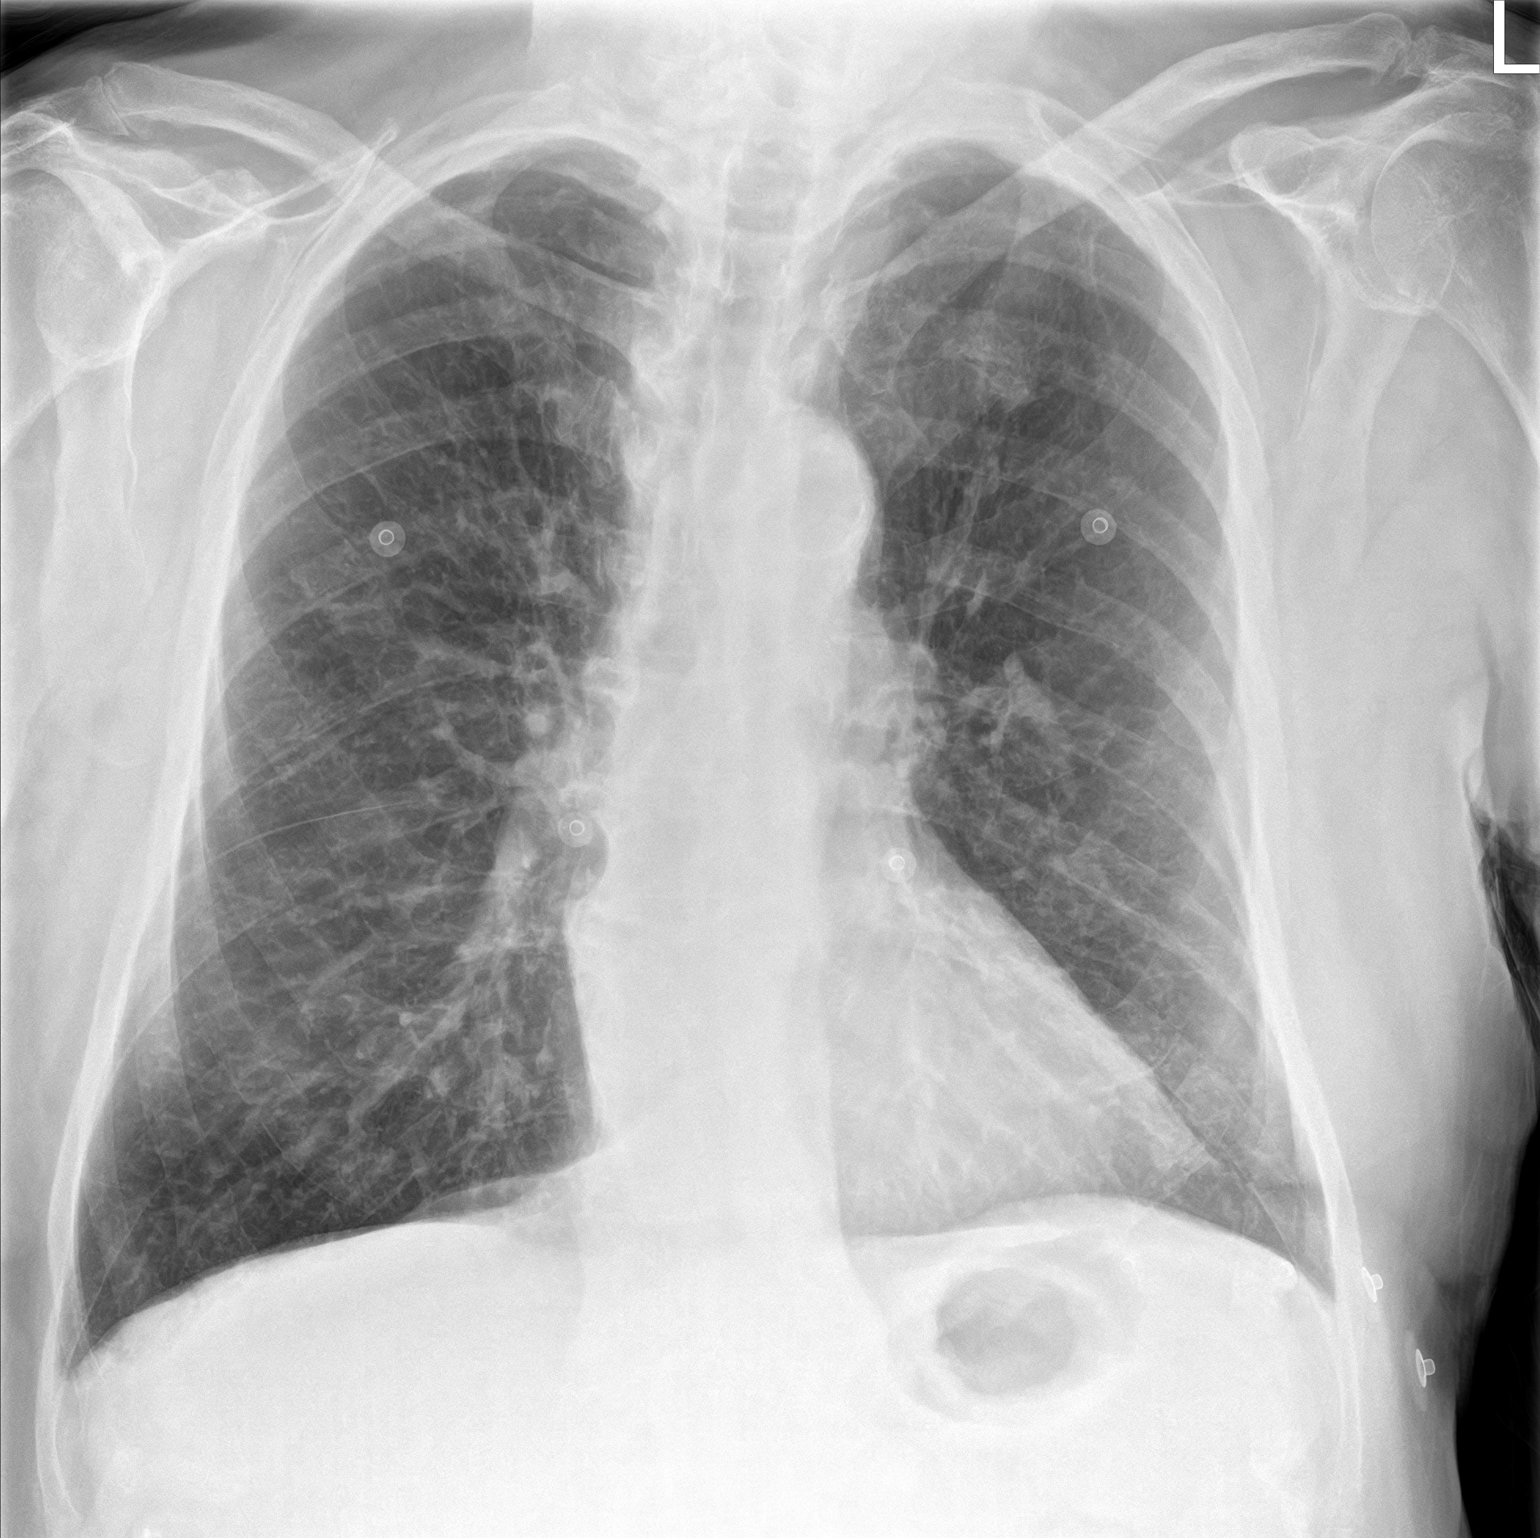

[1 of 1 positions shown; findings below may reference images not displayed]

FINDINGS: The heart size and mediastinal contours are within normal limits.
Both lungs are clear. The visualized skeletal structures are
unremarkable.
IMPRESSION: No active disease.

## 2023-12-28 ENCOUNTER — Other Ambulatory Visit: Payer: Self-pay

## 2023-12-28 DIAGNOSIS — I70203 Unspecified atherosclerosis of native arteries of extremities, bilateral legs: Secondary | ICD-10-CM | POA: Diagnosis not present

## 2023-12-28 DIAGNOSIS — E785 Hyperlipidemia, unspecified: Secondary | ICD-10-CM | POA: Diagnosis not present

## 2023-12-28 DIAGNOSIS — I4811 Longstanding persistent atrial fibrillation: Secondary | ICD-10-CM | POA: Diagnosis not present

## 2023-12-28 DIAGNOSIS — Z9889 Other specified postprocedural states: Secondary | ICD-10-CM | POA: Diagnosis not present

## 2023-12-28 DIAGNOSIS — I480 Paroxysmal atrial fibrillation: Secondary | ICD-10-CM | POA: Diagnosis not present

## 2023-12-28 DIAGNOSIS — I5022 Chronic systolic (congestive) heart failure: Secondary | ICD-10-CM | POA: Diagnosis not present

## 2023-12-28 DIAGNOSIS — R0602 Shortness of breath: Secondary | ICD-10-CM | POA: Diagnosis not present

## 2023-12-28 DIAGNOSIS — I420 Dilated cardiomyopathy: Secondary | ICD-10-CM | POA: Diagnosis not present

## 2023-12-28 DIAGNOSIS — I10 Essential (primary) hypertension: Secondary | ICD-10-CM | POA: Diagnosis not present

## 2023-12-28 DIAGNOSIS — I5040 Unspecified combined systolic (congestive) and diastolic (congestive) heart failure: Secondary | ICD-10-CM | POA: Diagnosis not present

## 2023-12-28 NOTE — Patient Instructions (Signed)
Thank you for allowing the Chronic Care Management team to participate in your care.  

## 2023-12-28 NOTE — Patient Outreach (Signed)
 Complex Care Management   Visit Note  12/28/2023  Name:  [name] GURRY Sr. MRN: [id number] DOB: 02-01-1941  Situation: Referral received for Complex Care Management related to {Criteria:32550} I obtained verbal consent from {CHL AMB Patient/Caregiver:28184}.  Visit completed with ***  {VISIT LOCATION:32553}  Background:   Past Medical History:  Diagnosis Date   A-fib [facility])    a.) CHA2DS2VASc = 5 (age x2, CHF, HTN, vascular disease history);  b.) rate/rhythm maintained on oral amiodarone ; chronically anticoagulated with rivaroxaban    Allergy    Arthritis    CAD (coronary artery disease) 11/17/2012   a.) LHC 11/17/2012: 30% mLM, 30% mLAD, 30% mLCx, 50%/30% OM1, 30% pRCA - med mgmt   Chronic systolic CHF (congestive heart failure), NYHA class 2 (HCC)    a.) TTE 05/30/2015: EF 40%, glob HK, mild LVH, mild LAE, mild MR/TR; b.) TTE 10/14/2017: EF 40%, glob HK, mild LVH, mod BAE, triv PR, mild MR/TR, G1DD   Complication of anesthesia    a.) hypotension during routine colonoscopy   COPD (chronic obstructive pulmonary disease) (HCC)    DDD (degenerative disc disease), lumbar    Dilated cardiomyopathy (HCC)    a.) LHC 11/17/2012: EF 35%; b.) TTE 05/30/2015: EF 40%; c.) TTE 10/14/2017: EF 40%   GERD (gastroesophageal reflux disease)    Grade I diastolic dysfunction    History of chicken pox    Hyperlipidemia    Hypertension    Hypothyroidism    Left bundle branch block    Long term current use of amiodarone     Long term current use of anticoagulant    a.) Rivaroxaban    Osteoporosis    Peripheral vascular disease (HCC)    Pre-diabetes    Spinal stenosis of lumbar region without neurogenic claudication    Stage 3b chronic kidney disease (HCC)    Wears dentures    full upper and lower    Assessment: Patient Reported Symptoms:  Cognitive        Neurological      HEENT        Cardiovascular   Weight: 241 lb (109.3 kg)  Respiratory      Endocrine      Gastrointestinal         Genitourinary      Integumentary      Musculoskeletal          Psychosocial       Quality of Family Relationships: supportive Do you feel physically threatened by others?: No      12/28/2023    4:48 PM  Depression screen PHQ 2/9  Decreased Interest 0  Down, Depressed, Hopeless 0  PHQ - 2 Score 0    There were no vitals filed for this visit.  Medications Reviewed Today     Reviewed by Karoline Lima, RN (Registered Nurse) on 12/28/23 at 1644  Med List Status: <None>   Medication Order Taking? Sig Documenting Provider Last Dose Status Informant  amiodarone  (PACERONE ) 200 MG tablet [id number] No Take 1 tablet (200 mg total) by mouth daily. [name], Rockie, MD 12/08/2023 Morning Active   clopidogrel  (PLAVIX ) 75 MG tablet [id number] No Take 1 tablet (75 mg total) by mouth daily. Elisabeth Round R, NP 12/07/2023 Active   ferrous sulfate  325 (65 FE) MG tablet [id number] No Take 1 tablet (325 mg total) by mouth daily with breakfast. [name], [name], MD 12/07/2023 Active   fluticasone  (FLONASE ) 50 MCG/ACT nasal spray [id number] No Place 2 sprays into both nostrils daily.  Patient taking differently: Place  2 sprays into both nostrils daily as needed.   Emilio Kelly DASEN, FNP Taking Active Self, Pharmacy Records, Multiple Informants  gabapentin  (NEURONTIN ) 300 MG capsule [id number] No Take 1 capsule (300 mg total) by mouth 2 (two) times daily. Marcelino Nurse, MD 12/07/2023 Active Self, Pharmacy Records  levothyroxine  (SYNTHROID ) 100 MCG tablet [id number] No TAKE 1 TABLET BY MOUTH DAILY BEFORE BREAKFAST [name], [name], MD 12/07/2023 Active   lisinopril  (ZESTRIL ) 2.5 MG tablet [id number] No Take 1 tablet (2.5 mg total) by mouth daily. [name], Rockie, MD 12/07/2023 Active   lovastatin  (MEVACOR ) 20 MG tablet [id number] No Take 1 tablet (20 mg total) by mouth at bedtime. [name], Rockie, MD 12/07/2023 Active   mupirocin  ointment (BACTROBAN ) 2 % [id number] No  Apply 1 Application topically daily. Brown, [name], NP 12/07/2023 Active   naloxone [facility]) nasal spray 4 mg/0.1 mL [id number] No SMARTSIG:Both Nares [provider] 12/07/2023 Active   torsemide  (DEMADEX ) 20 MG tablet [id number] No Take 1 tablet (20 mg total) by mouth 2 (two) times daily. [name], Rockie, MD 11/23/2023 Active   traMADol  (ULTRAM ) 50 MG tablet [id number] No Take 50 mg by mouth 2 (two) times daily as needed. [provider] 12/07/2023 Active Self, Pharmacy Records, Multiple Informants  XARELTO  20 MG TABS tablet [id number] No Take 1 tablet (20 mg total) by mouth daily. Sharma Rockie, MD 12/07/2023 Active   Med List Note Wendelyn Devere PEAK 05/04/18 9081): UDS 05/04/2018            Recommendation:   {RECOMMENDATONS:32554}  Follow Up Plan:   {FOLLOWUP:32559}  SIG ***

## 2023-12-29 DIAGNOSIS — E785 Hyperlipidemia, unspecified: Secondary | ICD-10-CM | POA: Diagnosis not present

## 2023-12-29 DIAGNOSIS — N1832 Chronic kidney disease, stage 3b: Secondary | ICD-10-CM | POA: Diagnosis not present

## 2023-12-29 DIAGNOSIS — I509 Heart failure, unspecified: Secondary | ICD-10-CM | POA: Diagnosis not present

## 2023-12-29 DIAGNOSIS — R609 Edema, unspecified: Secondary | ICD-10-CM | POA: Diagnosis not present

## 2023-12-29 DIAGNOSIS — D631 Anemia in chronic kidney disease: Secondary | ICD-10-CM | POA: Diagnosis not present

## 2023-12-29 DIAGNOSIS — I890 Lymphedema, not elsewhere classified: Secondary | ICD-10-CM | POA: Diagnosis not present

## 2023-12-29 DIAGNOSIS — I4821 Permanent atrial fibrillation: Secondary | ICD-10-CM | POA: Diagnosis not present

## 2023-12-29 DIAGNOSIS — I10 Essential (primary) hypertension: Secondary | ICD-10-CM | POA: Diagnosis not present

## 2023-12-29 DIAGNOSIS — J439 Emphysema, unspecified: Secondary | ICD-10-CM | POA: Diagnosis not present

## 2024-01-06 ENCOUNTER — Telehealth: Payer: Self-pay | Admitting: Family Medicine

## 2024-01-06 NOTE — Telephone Encounter (Signed)
 Patient dropped off power of attorney paperwork to be completed. Patient was advised it take 7-10 business days and will be called when ready for pick  Once completed also scan into Epic

## 2024-01-07 ENCOUNTER — Other Ambulatory Visit: Payer: Self-pay | Admitting: Family Medicine

## 2024-01-07 DIAGNOSIS — D631 Anemia in chronic kidney disease: Secondary | ICD-10-CM

## 2024-01-07 NOTE — Telephone Encounter (Signed)
 LOV 10/28/23 NOV 10/230/25 LRF 01/29/23 LABS 12/29/23

## 2024-01-08 NOTE — Telephone Encounter (Signed)
 Called patient to notify that POA paperwork was ready to be picked up. Got no answer on either number listed and unable to leave voicemails

## 2024-01-08 NOTE — Telephone Encounter (Unsigned)
 Copied from CRM [id number]. Topic: General - Other >> Jan 08, 2024  9:33 AM [name] wrote: Reason for CRM: Pt returning [name] CROME call regarding paperwork ready to pick up. Pt has questions and would like a call back at [phone].

## 2024-01-12 ENCOUNTER — Emergency Department

## 2024-01-12 ENCOUNTER — Other Ambulatory Visit: Payer: Self-pay

## 2024-01-12 ENCOUNTER — Ambulatory Visit: Payer: Self-pay | Admitting: *Deleted

## 2024-01-12 ENCOUNTER — Inpatient Hospital Stay
Admission: EM | Admit: 2024-01-12 | Discharge: 2024-01-15 | DRG: 193 | Disposition: A | Discharge status: Skilled Nursing Facility | Attending: Internal Medicine | Admitting: Internal Medicine

## 2024-01-12 DIAGNOSIS — I5022 Chronic systolic (congestive) heart failure: Secondary | ICD-10-CM | POA: Diagnosis present

## 2024-01-12 DIAGNOSIS — R0902 Hypoxemia: Secondary | ICD-10-CM | POA: Diagnosis not present

## 2024-01-12 DIAGNOSIS — Z7901 Long term (current) use of anticoagulants: Secondary | ICD-10-CM

## 2024-01-12 DIAGNOSIS — Z1152 Encounter for screening for COVID-19: Secondary | ICD-10-CM | POA: Diagnosis not present

## 2024-01-12 DIAGNOSIS — E039 Hypothyroidism, unspecified: Secondary | ICD-10-CM | POA: Diagnosis present

## 2024-01-12 DIAGNOSIS — E785 Hyperlipidemia, unspecified: Secondary | ICD-10-CM | POA: Diagnosis present

## 2024-01-12 DIAGNOSIS — J439 Emphysema, unspecified: Secondary | ICD-10-CM | POA: Diagnosis present

## 2024-01-12 DIAGNOSIS — G8929 Other chronic pain: Secondary | ICD-10-CM | POA: Diagnosis not present

## 2024-01-12 DIAGNOSIS — I480 Paroxysmal atrial fibrillation: Secondary | ICD-10-CM | POA: Diagnosis not present

## 2024-01-12 DIAGNOSIS — K219 Gastro-esophageal reflux disease without esophagitis: Secondary | ICD-10-CM | POA: Diagnosis present

## 2024-01-12 DIAGNOSIS — J441 Chronic obstructive pulmonary disease with (acute) exacerbation: Secondary | ICD-10-CM | POA: Diagnosis not present

## 2024-01-12 DIAGNOSIS — I130 Hypertensive heart and chronic kidney disease with heart failure and stage 1 through stage 4 chronic kidney disease, or unspecified chronic kidney disease: Secondary | ICD-10-CM | POA: Diagnosis present

## 2024-01-12 DIAGNOSIS — Z515 Encounter for palliative care: Secondary | ICD-10-CM | POA: Diagnosis not present

## 2024-01-12 DIAGNOSIS — N184 Chronic kidney disease, stage 4 (severe): Secondary | ICD-10-CM | POA: Diagnosis not present

## 2024-01-12 DIAGNOSIS — I2510 Atherosclerotic heart disease of native coronary artery without angina pectoris: Secondary | ICD-10-CM | POA: Diagnosis present

## 2024-01-12 DIAGNOSIS — J690 Pneumonitis due to inhalation of food and vomit: Secondary | ICD-10-CM | POA: Diagnosis present

## 2024-01-12 DIAGNOSIS — J9601 Acute respiratory failure with hypoxia: Secondary | ICD-10-CM | POA: Diagnosis present

## 2024-01-12 DIAGNOSIS — J122 Parainfluenza virus pneumonia: Principal | ICD-10-CM | POA: Diagnosis present

## 2024-01-12 DIAGNOSIS — Z87891 Personal history of nicotine dependence: Secondary | ICD-10-CM

## 2024-01-12 DIAGNOSIS — Z820 Family history of epilepsy and other diseases of the nervous system: Secondary | ICD-10-CM

## 2024-01-12 DIAGNOSIS — I4821 Permanent atrial fibrillation: Secondary | ICD-10-CM | POA: Diagnosis not present

## 2024-01-12 DIAGNOSIS — I4811 Longstanding persistent atrial fibrillation: Secondary | ICD-10-CM | POA: Diagnosis present

## 2024-01-12 DIAGNOSIS — J438 Other emphysema: Secondary | ICD-10-CM | POA: Diagnosis not present

## 2024-01-12 DIAGNOSIS — I70203 Unspecified atherosclerosis of native arteries of extremities, bilateral legs: Secondary | ICD-10-CM | POA: Diagnosis present

## 2024-01-12 DIAGNOSIS — I420 Dilated cardiomyopathy: Secondary | ICD-10-CM | POA: Diagnosis present

## 2024-01-12 DIAGNOSIS — R918 Other nonspecific abnormal finding of lung field: Secondary | ICD-10-CM | POA: Diagnosis not present

## 2024-01-12 DIAGNOSIS — I70202 Unspecified atherosclerosis of native arteries of extremities, left leg: Secondary | ICD-10-CM | POA: Diagnosis not present

## 2024-01-12 DIAGNOSIS — Z951 Presence of aortocoronary bypass graft: Secondary | ICD-10-CM | POA: Diagnosis not present

## 2024-01-12 DIAGNOSIS — R0602 Shortness of breath: Secondary | ICD-10-CM | POA: Diagnosis not present

## 2024-01-12 DIAGNOSIS — I2489 Other forms of acute ischemic heart disease: Secondary | ICD-10-CM | POA: Diagnosis not present

## 2024-01-12 DIAGNOSIS — J189 Pneumonia, unspecified organism: Secondary | ICD-10-CM | POA: Diagnosis not present

## 2024-01-12 DIAGNOSIS — I890 Lymphedema, not elsewhere classified: Secondary | ICD-10-CM | POA: Diagnosis present

## 2024-01-12 DIAGNOSIS — J168 Pneumonia due to other specified infectious organisms: Secondary | ICD-10-CM | POA: Diagnosis not present

## 2024-01-12 DIAGNOSIS — I700 Atherosclerosis of aorta: Secondary | ICD-10-CM | POA: Diagnosis not present

## 2024-01-12 DIAGNOSIS — Z7902 Long term (current) use of antithrombotics/antiplatelets: Secondary | ICD-10-CM | POA: Diagnosis not present

## 2024-01-12 DIAGNOSIS — J440 Chronic obstructive pulmonary disease with acute lower respiratory infection: Secondary | ICD-10-CM | POA: Diagnosis not present

## 2024-01-12 DIAGNOSIS — Z79890 Hormone replacement therapy (postmenopausal): Secondary | ICD-10-CM

## 2024-01-12 DIAGNOSIS — J101 Influenza due to other identified influenza virus with other respiratory manifestations: Secondary | ICD-10-CM | POA: Diagnosis not present

## 2024-01-12 DIAGNOSIS — M549 Dorsalgia, unspecified: Secondary | ICD-10-CM | POA: Diagnosis present

## 2024-01-12 DIAGNOSIS — I517 Cardiomegaly: Secondary | ICD-10-CM | POA: Diagnosis not present

## 2024-01-12 DIAGNOSIS — Z79899 Other long term (current) drug therapy: Secondary | ICD-10-CM

## 2024-01-12 DIAGNOSIS — M6281 Muscle weakness (generalized): Secondary | ICD-10-CM | POA: Diagnosis not present

## 2024-01-12 DIAGNOSIS — Z8249 Family history of ischemic heart disease and other diseases of the circulatory system: Secondary | ICD-10-CM

## 2024-01-12 DIAGNOSIS — Z66 Do not resuscitate: Secondary | ICD-10-CM | POA: Diagnosis present

## 2024-01-12 DIAGNOSIS — R2689 Other abnormalities of gait and mobility: Secondary | ICD-10-CM | POA: Diagnosis not present

## 2024-01-12 LAB — CBC
HCT: 42 % (ref 39.0–52.0)
Hemoglobin: 13.4 g/dL (ref 13.0–17.0)
MCH: 30.5 pg (ref 26.0–34.0)
MCHC: 31.9 g/dL (ref 30.0–36.0)
MCV: 95.7 fL (ref 80.0–100.0)
Platelets: 290 K/uL (ref 150–400)
RBC: 4.39 MIL/uL (ref 4.22–5.81)
RDW: 15.1 % (ref 11.5–15.5)
WBC: 10.8 K/uL — ABNORMAL HIGH (ref 4.0–10.5)
nRBC: 0 % (ref 0.0–0.2)

## 2024-01-12 LAB — URINALYSIS, W/ REFLEX TO CULTURE (INFECTION SUSPECTED)
Bacteria, UA: NONE SEEN
Bilirubin Urine: NEGATIVE
Glucose, UA: NEGATIVE mg/dL
Hgb urine dipstick: NEGATIVE
Ketones, ur: NEGATIVE mg/dL
Leukocytes,Ua: NEGATIVE
Nitrite: NEGATIVE
Protein, ur: NEGATIVE mg/dL
RBC / HPF: 0 RBC/hpf (ref 0–5)
Specific Gravity, Urine: 1.011 (ref 1.005–1.030)
Squamous Epithelial / HPF: 0 /HPF (ref 0–5)
pH: 5 (ref 5.0–8.0)

## 2024-01-12 LAB — BRAIN NATRIURETIC PEPTIDE: B Natriuretic Peptide: 1021.4 pg/mL — ABNORMAL HIGH (ref 0.0–100.0)

## 2024-01-12 LAB — RESP PANEL BY RT-PCR (RSV, FLU A&B, COVID)  RVPGX2
Influenza A by PCR: NEGATIVE
Influenza B by PCR: NEGATIVE
Resp Syncytial Virus by PCR: NEGATIVE
SARS Coronavirus 2 by RT PCR: NEGATIVE

## 2024-01-12 LAB — STREP PNEUMONIAE URINARY ANTIGEN: Strep Pneumo Urinary Antigen: NEGATIVE

## 2024-01-12 LAB — COMPREHENSIVE METABOLIC PANEL WITH GFR
ALT: 26 U/L (ref 0–44)
AST: 33 U/L (ref 15–41)
Albumin: 3.7 g/dL (ref 3.5–5.0)
Alkaline Phosphatase: 102 U/L (ref 38–126)
Anion gap: 13 (ref 5–15)
BUN: 33 mg/dL — ABNORMAL HIGH (ref 8–23)
CO2: 27 mmol/L (ref 22–32)
Calcium: 9.1 mg/dL (ref 8.9–10.3)
Chloride: 98 mmol/L (ref 98–111)
Creatinine, Ser: 2.12 mg/dL — ABNORMAL HIGH (ref 0.61–1.24)
GFR, Estimated: 31 mL/min — ABNORMAL LOW (ref >60)
Glucose, Bld: 139 mg/dL — ABNORMAL HIGH (ref 70–99)
Potassium: 3.8 mmol/L (ref 3.5–5.1)
Sodium: 138 mmol/L (ref 135–145)
Total Bilirubin: 1.1 mg/dL (ref 0.0–1.2)
Total Protein: 7 g/dL (ref 6.5–8.1)

## 2024-01-12 LAB — PROTIME-INR
INR: 3.3 — ABNORMAL HIGH (ref 0.8–1.2)
Prothrombin Time: 35.3 s — ABNORMAL HIGH (ref 11.4–15.2)

## 2024-01-12 LAB — BLOOD GAS, VENOUS
Acid-Base Excess: 4.3 mmol/L — ABNORMAL HIGH (ref 0.0–2.0)
Bicarbonate: 29.8 mmol/L — ABNORMAL HIGH (ref 20.0–28.0)
O2 Saturation: 94.3 %
Patient temperature: 37
pCO2, Ven: 47 mmHg (ref 44–60)
pH, Ven: 7.41 (ref 7.25–7.43)
pO2, Ven: 66 mmHg — ABNORMAL HIGH (ref 32–45)

## 2024-01-12 LAB — TROPONIN I (HIGH SENSITIVITY)
Troponin I (High Sensitivity): 20 ng/L — ABNORMAL HIGH (ref <18)
Troponin I (High Sensitivity): 31 ng/L — ABNORMAL HIGH (ref <18)

## 2024-01-12 LAB — TSH: TSH: 2.316 u[IU]/mL (ref 0.350–4.500)

## 2024-01-12 LAB — LACTIC ACID, PLASMA: Lactic Acid, Venous: 1.5 mmol/L (ref 0.5–1.9)

## 2024-01-12 LAB — PROCALCITONIN: Procalcitonin: <0.1 ng/mL

## 2024-01-12 MED ORDER — IPRATROPIUM-ALBUTEROL 0.5-2.5 (3) MG/3ML IN SOLN: 3.0000 mL | RESPIRATORY_TRACT | Status: DC | PRN

## 2024-01-12 MED ORDER — TRAMADOL HCL 50 MG PO TABS
50.0000 mg | ORAL_TABLET | Freq: Two times a day (BID) | ORAL | Status: DC | PRN
  Administered 2024-01-13 – 2024-01-14 (×3): 50 mg via ORAL
  Filled 2024-01-12 (×3): qty 1

## 2024-01-12 MED ORDER — SODIUM CHLORIDE 0.9 % IV SOLN
500.0000 mg | INTRAVENOUS | Status: DC
  Administered 2024-01-12: 500 mg via INTRAVENOUS
  Filled 2024-01-12: qty 5

## 2024-01-12 MED ORDER — SODIUM CHLORIDE 0.9 % IV SOLN
100.0000 mg | Freq: Once | INTRAVENOUS | Status: DC
  Filled 2024-01-12: qty 100

## 2024-01-12 MED ORDER — IPRATROPIUM-ALBUTEROL 0.5-2.5 (3) MG/3ML IN SOLN
9.0000 mL | Freq: Once | RESPIRATORY_TRACT | Status: AC
  Administered 2024-01-12: 9 mL via RESPIRATORY_TRACT
  Filled 2024-01-12: qty 3

## 2024-01-12 MED ORDER — AMIODARONE HCL 200 MG PO TABS
200.0000 mg | ORAL_TABLET | Freq: Every day | ORAL | Status: DC
  Administered 2024-01-13 – 2024-01-15 (×3): 200 mg via ORAL
  Filled 2024-01-12 (×3): qty 1

## 2024-01-12 MED ORDER — BUDESONIDE 0.25 MG/2ML IN SUSP
0.2500 mg | Freq: Two times a day (BID) | RESPIRATORY_TRACT | Status: DC
  Administered 2024-01-12 – 2024-01-13 (×2): 0.25 mg via RESPIRATORY_TRACT
  Filled 2024-01-12 (×2): qty 2

## 2024-01-12 MED ORDER — FLUTICASONE PROPIONATE 50 MCG/ACT NA SUSP
2.0000 | Freq: Every day | NASAL | Status: DC
  Administered 2024-01-13 – 2024-01-15 (×3): 2 via NASAL
  Filled 2024-01-12 (×2): qty 16

## 2024-01-12 MED ORDER — METHYLPREDNISOLONE SODIUM SUCC 125 MG IJ SOLR
125.0000 mg | Freq: Once | INTRAMUSCULAR | Status: AC
  Administered 2024-01-12: 125 mg via INTRAVENOUS
  Filled 2024-01-12: qty 2

## 2024-01-12 MED ORDER — CLOPIDOGREL BISULFATE 75 MG PO TABS
75.0000 mg | ORAL_TABLET | Freq: Every day | ORAL | Status: DC
  Administered 2024-01-13 – 2024-01-15 (×3): 75 mg via ORAL
  Filled 2024-01-12 (×3): qty 1

## 2024-01-12 MED ORDER — SODIUM CHLORIDE 0.9 % IV SOLN
2.0000 g | Freq: Once | INTRAVENOUS | Status: AC
  Administered 2024-01-12: 2 g via INTRAVENOUS
  Filled 2024-01-12: qty 12.5

## 2024-01-12 MED ORDER — GABAPENTIN 300 MG PO CAPS
300.0000 mg | ORAL_CAPSULE | Freq: Two times a day (BID) | ORAL | Status: DC
  Administered 2024-01-12 – 2024-01-15 (×6): 300 mg via ORAL
  Filled 2024-01-12 (×6): qty 1

## 2024-01-12 MED ORDER — ACETAMINOPHEN 325 MG PO TABS: 650.0000 mg | ORAL_TABLET | Freq: Four times a day (QID) | ORAL | Status: DC | PRN

## 2024-01-12 MED ORDER — RIVAROXABAN 15 MG PO TABS
15.0000 mg | ORAL_TABLET | Freq: Every day | ORAL | Status: DC
  Administered 2024-01-13 – 2024-01-15 (×3): 15 mg via ORAL
  Filled 2024-01-12 (×3): qty 1

## 2024-01-12 MED ORDER — BUMETANIDE 0.25 MG/ML IJ SOLN
1.0000 mg | Freq: Two times a day (BID) | INTRAMUSCULAR | Status: DC
  Administered 2024-01-12 – 2024-01-13 (×3): 1 mg via INTRAVENOUS
  Filled 2024-01-12 (×5): qty 4

## 2024-01-12 MED ORDER — LACTATED RINGERS IV SOLN: INTRAVENOUS | Status: DC

## 2024-01-12 MED ORDER — SODIUM CHLORIDE 0.9 % IV SOLN
500.0000 mg | INTRAVENOUS | Status: DC
  Administered 2024-01-13: 500 mg via INTRAVENOUS
  Filled 2024-01-12: qty 5

## 2024-01-12 MED ORDER — ACETAMINOPHEN 650 MG RE SUPP: 650.0000 mg | Freq: Four times a day (QID) | RECTAL | Status: DC | PRN

## 2024-01-12 MED ORDER — PRAVASTATIN SODIUM 20 MG PO TABS
20.0000 mg | ORAL_TABLET | Freq: Every day | ORAL | Status: DC
  Administered 2024-01-12 – 2024-01-14 (×3): 20 mg via ORAL
  Filled 2024-01-12 (×3): qty 1

## 2024-01-12 MED ORDER — SODIUM CHLORIDE 0.9 % IV SOLN
2.0000 g | INTRAVENOUS | Status: DC
  Administered 2024-01-13 – 2024-01-15 (×3): 2 g via INTRAVENOUS
  Filled 2024-01-12 (×3): qty 20

## 2024-01-12 MED ORDER — LEVOTHYROXINE SODIUM 100 MCG PO TABS
100.0000 ug | ORAL_TABLET | Freq: Every day | ORAL | Status: DC
  Administered 2024-01-13 – 2024-01-15 (×3): 100 ug via ORAL
  Filled 2024-01-12 (×3): qty 1

## 2024-01-12 MED ORDER — GUAIFENESIN ER 600 MG PO TB12
600.0000 mg | ORAL_TABLET | Freq: Two times a day (BID) | ORAL | Status: DC
  Administered 2024-01-12 – 2024-01-15 (×7): 600 mg via ORAL
  Filled 2024-01-12 (×7): qty 1

## 2024-01-12 MED ORDER — RIVAROXABAN 20 MG PO TABS: 20.0000 mg | ORAL_TABLET | Freq: Every day | ORAL | Status: DC

## 2024-01-12 MED ORDER — MUPIROCIN 2 % EX OINT
1.0000 | TOPICAL_OINTMENT | Freq: Every day | CUTANEOUS | Status: DC
  Administered 2024-01-12 – 2024-01-15 (×4): 1 via TOPICAL
  Filled 2024-01-12 (×2): qty 22

## 2024-01-12 NOTE — ED Triage Notes (Addendum)
 Pt to ED via POV from home. Pt was assisted out of car with notable labored breathing. Pt reports SOB started this am. Pt denies CP. Pt reports hx of CHF and COPD. No at home oxygen use. Pt with audible wheezing. Pt RA 82%. No improvement with initial Godfrey and flow rate currently 6L  at 93%. Pt HR 130s. Pt taken to room 13 due to acuity

## 2024-01-12 NOTE — Sepsis Progress Note (Signed)
 Elink monitoring for the code sepsis protocol.

## 2024-01-12 NOTE — Evaluation (Signed)
 Clinical/Bedside Swallow Evaluation Patient Details  Name: [name] FRANKS Sr. MRN: [id number] Date of Birth: 1940/08/29  Today's Date: 01/12/2024 Time: SLP Start Time (ACUTE ONLY): 1510 SLP Stop Time (ACUTE ONLY): 1610 SLP Time Calculation (min) (ACUTE ONLY): 60 min  Past Medical History:  Past Medical History:  Diagnosis Date   A-fib (HCC)    a.) CHA2DS2VASc = 5 (age x2, CHF, HTN, vascular disease history);  b.) rate/rhythm maintained on oral amiodarone ; chronically anticoagulated with rivaroxaban    Allergy    Arthritis    CAD (coronary artery disease) 11/17/2012   a.) LHC 11/17/2012: 30% mLM, 30% mLAD, 30% mLCx, 50%/30% OM1, 30% pRCA - med mgmt   Chronic systolic CHF (congestive heart failure), NYHA class 2 (HCC)    a.) TTE 05/30/2015: EF 40%, glob HK, mild LVH, mild LAE, mild MR/TR; b.) TTE 10/14/2017: EF 40%, glob HK, mild LVH, mod BAE, triv PR, mild MR/TR, G1DD   Complication of anesthesia    a.) hypotension during routine colonoscopy   COPD (chronic obstructive pulmonary disease) (HCC)    DDD (degenerative disc disease), lumbar    Dilated cardiomyopathy (HCC)    a.) LHC 11/17/2012: EF 35%; b.) TTE 05/30/2015: EF 40%; c.) TTE 10/14/2017: EF 40%   GERD (gastroesophageal reflux disease)    Grade I diastolic dysfunction    History of chicken pox    Hyperlipidemia    Hypertension    Hypothyroidism    Left bundle branch block    Long term current use of amiodarone     Long term current use of anticoagulant    a.) Rivaroxaban    Osteoporosis    Peripheral vascular disease (HCC)    Pre-diabetes    Spinal stenosis of lumbar region without neurogenic claudication    Stage 3b chronic kidney disease (HCC)    Wears dentures    full upper and lower   Past Surgical History:  Past Surgical History:  Procedure Laterality Date   BACK SURGERY     x5   CATARACT EXTRACTION W/PHACO Left 11/24/2023   Procedure: PHACOEMULSIFICATION, CATARACT, WITH IOL INSERTION 29.25 02:17.6;  Surgeon:  Jaye Fallow, MD;  Location: [facility];  Service: Ophthalmology;  Laterality: Left;   CATARACT EXTRACTION W/PHACO Right 12/08/2023   Procedure: PHACOEMULSIFICATION, CATARACT, WITH IOL INSERTION 9.35 01:00.7;  Surgeon: Jaye Fallow, MD;  Location: [facility];  Service: Ophthalmology;  Laterality: Right;   DOPPLER ECHOCARDIOGRAPHY  06/21/2013   EF=35% while in Sinus Rhythm   ENDARTERECTOMY FEMORAL Left 10/22/2022   Procedure: ENDARTERECTOMY FEMORAL;  Surgeon: Jama Cordella MATSU, MD;  Location: [facility] ORS;  Service: Vascular;  Laterality: Left;   FEMORAL-TIBIAL BYPASS GRAFT Left 10/22/2022   Procedure: BYPASS GRAFT FEMORAL-TIBIAL ARTERY;  Surgeon: Jama Cordella MATSU, MD;  Location: [facility] OR[facility];  Service: Vascular;  Laterality: Left;   LEFT HEART CATH AND CORONARY ANGIOGRAPHY Left 11/17/2012   Procedure: LEFT HEART CATHETERIZATION AND CORONARY ANGIOGRAPHY; Location: [facility]; Surgeon: Marsa Dooms, MD   LOWER EXTREMITY ANGIOGRAPHY Right 05/20/2022   Procedure: Lower Extremity Angiography;  Surgeon: Jama Cordella MATSU, MD;  Location: [facility];  Service: Cardiovascular;  Laterality: Right;   LOWER EXTREMITY ANGIOGRAPHY Left 07/15/2022   Procedure: Lower Extremity Angiography;  Surgeon: Jama Cordella MATSU, MD;  Location: [facility] INVASIVE CV LAB;  Service: Cardiovascular;  Laterality: Left;   Myocardial perfusion scan  10/28/2012   severe global LV enlargement. Mild RV enlargement. Mild LVH. Mild mitral and tricuspid insufficency   polyp excision  09/28/2005   UPPER GASTROINTESTINAL ENDOSCOPY  03/27/2004   Gastropathy, Gastritis; Duodenopathy   HPI:  Pt is a 83 y.o. male with history of non-ischemic dilated cardiomyopathy, Chronic systolic congestive heart failure with LVEF 40%, with chronic, stable exertional dyspnea, with chronic lymphedema, worse after left  femoral-tibial bypass and femoral endarterectomy, paroxysmal atrial fibrillation on Xarelto , COPD not on home  oxygen presents to the emergency department for evaluation of 5 days of worsening shortness of breath  with associated dry cough and runny nose, and swelling into his abdomen.[name]  He reports compliance with all of his medications including his diuretic and Xarelto  and denies changes in his urine output.  He denies fevers, dizziness, abdominal pain, nausea, vomiting, diarrhea.     On arrival he was saturating 82% on room air, and placed on 45% high flow nasal cannula.  He was then rate controlled atrial fibrillation, with soft but stable blood pressure. his most recent Caridiology office visit note mentions low BP as well.  He had slight leukocytosis of 10.8 and was afebrile.  Creatinine appears near baseline.  BNP was greater than 1000.   CXR with either asymmetric pulmonary edema or bronco pneumonia in the right lung base.  Per MD note, will admit for further management of acute hypoxic respiratory failure secondary to possible pneumonia and systolic heart failure exacerbation. Of note, patient confirmed he is DNR and requested to speak with someone from Hospice for when he goes home..    Assessment / Plan / Recommendation  Clinical Impression   Pt seen for BSE. Pt awake, sat himself EOB for the po trials. Pt verbal and A/Ox4. He followed all instructions. Noted he was easily SOB w/ increased exertion including talking- encouraged rest breaks and no talking during po's to maintain calm breathing. Son present briefly. Menu discussed and next 3 meals ordered w/ pt(for support).  On HFNC O2 support 45L; afebrile. WBC 10.8.  Pt appears to present w/ grossly functional oropharyngeal phase swallowing w/ No overt oropharyngeal phase dysphagia noted, No overt neuromuscular deficits noted. Pt consumed po trials w/ No overt, clinical s/s of aspiration during po trials.  Pt appears at reduced risk for aspiration/aspiration pneumonia when following general aspiration precautions including taking REST BREAKS during  meals to lessen any fatigue and/or WOB/SOB- pt has Baseline Pulmonary decline. Pt does have challenging factors that could impact oropharyngeal swallowing to include deconditioning/weakness, CHF and Pulmonary decline requiring increased O2 support currently, and advanced age; also noted min shakiness in UEs during self-feeding. These factors can increase risk for aspiration, dysphagia as well as decreased oral intake overall.   During po trials, pt consumed all consistencies w/ no overt coughing, decline in vocal quality, nor change in respiratory presentation during/post trials. O2 sats remained 97-99%. Noted he took Small sips often. Oral phase appeared grossly [facility] w/ timely bolus management, mastication, and control of bolus propulsion for A-P transfer for swallowing. Oral clearing achieved w/ all trial consistencies -- moistened, soft foods given. Noted intermittent mouth breathing. REST BREAKS were instructed on/taken to lessen any WOB- conservation of energy. OM Exam appeared [facility] w/ no unilateral weakness noted. Speech Clear. Pt fed self w/ min setup support- noted min shaky UEs. He sat himself EOB w/ support.   Recommend continue a fairly Regular consistency diet w/ well-Cut/Chopped meats/foods, moistened foods; Thin liquids -- carefully monitor IF any straw use(LESS recommended d/t respiratory status), and pt should Hold Cup when drinking. Recommend general aspiration precautions including SMALL, SINGLE SIPS SLOWLY, reduce distractions and Talking at meals, REST BREAKS  during meals for conservation of energy. Tray setup and support as needed at meals. Pills WHOLE in Puree for safer, easier swallowing -- it was encourged now and for D/C to the pt/NSG.  Education given on Pills in Puree; food consistencies and easy to eat options; general aspiration precautions to pt. NSG to reconsult if any new needs arise while admitted. MD/NSG updated, agreed. Recommend Dietician and Palliative Care f/u for support.  Precautions posted in chart. SLP Visit Diagnosis: Dysphagia, unspecified (R13.10) (Declined Pulmonary status, chronic. Deconditioning.)    Aspiration Risk  Mild aspiration risk;Risk for inadequate nutrition/hydration (d/t Pulmonary decline)    Diet Recommendation   Thin;Age appropriate regular (chopped/cut meats/foods; moistened well) = a fairly Regular consistency diet w/ well-Cut/Chopped meats/foods, moistened foods; Thin liquids -- carefully monitor IF any straw use(LESS recommended d/t respiratory status), and pt should Hold Cup when drinking. Recommend general aspiration precautions including SMALL, SINGLE SIPS SLOWLY, reduce distractions and Talking at meals, REST BREAKS during meals for conservation of energy. Tray setup and support as needed at meals.   Medication Administration: Whole meds with puree (for safer swallowing)    Other  Recommendations Recommended Consults:  (Dietician) Oral Care Recommendations: Oral care BID;Oral care before and after PO;Staff/trained caregiver to provide oral care (D[facility] care)     Assistance Recommended at Discharge  Intermittent   Functional Status Assessment Patient has not had a recent decline in their functional status  Frequency and Duration  (n/a)   (n/a)       Prognosis Prognosis for improved oropharyngeal function: Fair (-Good) Barriers to Reach Goals: Time post onset;Severity of deficits Barriers/Prognosis Comment: Declined Pulmonary status, chronic. Deconditioning.      Swallow Study   General Date of Onset: 01/12/24 HPI: Pt is a 83 y.o. male with history of non-ischemic dilated cardiomyopathy, Chronic systolic congestive heart failure with LVEF 40%, with chronic, stable exertional dyspnea, with chronic lymphedema, worse after left  femoral-tibial bypass and femoral endarterectomy, paroxysmal atrial fibrillation on Xarelto , COPD not on home oxygen presents to the emergency department for evaluation of 5 days of worsening shortness of  breath  with associated dry cough and runny nose, and swelling into his abdomen.[name]  He reports compliance with all of his medications including his diuretic and Xarelto  and denies changes in his urine output.  He denies fevers, dizziness, abdominal pain, nausea, vomiting, diarrhea.     On arrival he was saturating 82% on room air, and placed on 45% high flow nasal cannula.  He was then rate controlled atrial fibrillation, with soft but stable blood pressure. his most recent Caridiology office visit note mentions low BP as well.  He had slight leukocytosis of 10.8 and was afebrile.  Creatinine appears near baseline.  BNP was greater than 1000.   CXR with either asymmetric pulmonary edema or bronco pneumonia in the right lung base.  Per MD note, will admit for further management of acute hypoxic respiratory failure secondary to possible pneumonia and systolic heart failure exacerbation. Of note, patient confirmed he is DNR and requested to speak with someone from Hospice for when he goes home.. Type of Study: Bedside Swallow Evaluation Previous Swallow Assessment: none Diet Prior to this Study: Regular;Thin liquids (Level 0) Temperature Spikes Noted: No (wbc 10.8) Respiratory Status: Nasal cannula (HFNC 45L) History of Recent Intubation: No Behavior/Cognition: Alert;Cooperative;Pleasant mood (A/Ox3) Oral Cavity Assessment: Within Functional Limits Oral Care Completed by SLP: Recent completion by staff Oral Cavity - Dentition: Dentures, top;Dentures, bottom (present) Vision: Functional for self-feeding  Self-Feeding Abilities: Able to feed self;Needs assist;Needs set up Patient Positioning: Upright in bed (supported EOB) Baseline Vocal Quality: Normal Volitional Cough: Strong Volitional Swallow: Able to elicit    Oral/Motor/Sensory Function     Ice Chips Ice chips: Within functional limits Presentation: Spoon (fed; 2 trials)   Thin Liquid Thin Liquid: Within functional limits Presentation:  Cup;Self Fed (~4 ozs) Other Comments: slowly w/ rest breaks    Nectar Thick Nectar Thick Liquid: Not tested   Honey Thick Honey Thick Liquid: Not tested   Puree Puree: Within functional limits Presentation: Spoon;Self Fed (~4 ozs)   Solid     Solid: Within functional limits Presentation: Self Fed (6 trials) Other Comments: moistened        [name] Portugal, MS, CCC-SLP Speech Language Pathologist Rehab Services; [facility] [phone] (ascom) [name] 01/12/2024,4:30 PM

## 2024-01-12 NOTE — Telephone Encounter (Signed)
 FYI Only or Action Required?: FYI only for provider.  Patient was last seen in primary care on 10/28/2023 by Sharma Coyer, MD.  Called Nurse Triage reporting Shortness of Breath.  Symptoms began a week ago.  Interventions attempted: Rest, hydration, or home remedies.  Symptoms are: gradually worsening.  Triage Disposition: Go to ED Now (or PCP Triage)  Patient/caregiver understands and will follow disposition?: Yes               Copied from CRM [id number]. Topic: Clinical - Red Word Triage >> Jan 12, 2024  8:36 AM Carlatta H wrote: [facility] that prompted transfer to Nurse Triage: Patient is having shortness of breath for about 5 days with a bad cough// Reason for Disposition  Patient sounds very sick or weak to the triager  Answer Assessment - Initial Assessment Questions Recommended ED now. Patient reports he doesn't have anyone to help care for him and will be driving self.        1. RESPIRATORY STATUS: Describe your breathing? (e.g., wheezing, shortness of breath, unable to speak, severe coughing)      Shortness of cough at rest, wheezing cough  2. ONSET: When did this breathing problem begin?      5 days ago  3. PATTERN Does the difficult breathing come and go, or has it been constant since it started?      constant 4. SEVERITY: How bad is your breathing? (e.g., mild, moderate, severe)      Getting worse everyday  5. RECURRENT SYMPTOM: Have you had difficulty breathing before? If Yes, ask: When was the last time? and What happened that time?      Yes  6. CARDIAC HISTORY: Do you have any history of heart disease? (e.g., heart attack, angina, bypass surgery, angioplasty)      HX CHF 7. LUNG HISTORY: Do you have any history of lung disease?  (e.g., pulmonary embolus, asthma, emphysema)     Hx COPD 8. CAUSE: What do you think is causing the breathing problem?      Cough  9. OTHER SYMPTOMS: Do you have any other symptoms? (e.g.,  chest pain, cough, dizziness, fever, runny nose)     Cough yellow green sputum, wheezing  10. O2 SATURATION MONITOR:  Do you use an oxygen saturation monitor (pulse oximeter) at home? If Yes, ask: What is your reading (oxygen level) today? What is your usual oxygen saturation reading? (e.g., 95%)       na 11. PREGNANCY: Is there any chance you are pregnant? When was your last menstrual period?       na 12. TRAVEL: Have you traveled out of the country in the last month? (e.g., travel history, exposures)       na  Protocols used: Breathing Difficulty-A-AH

## 2024-01-12 NOTE — ED Provider Notes (Addendum)
 [facility] Provider Note    Event Date/Time   First MD Initiated Contact with Patient 01/12/24 1004     (approximate)   History   Shortness of Breath   HPI  [name] Sr. is a 83 y.o. male past medical history significant for nonischemic dilated cardiomyopathy, CHF with a EF of 25-30%, CAD, paroxysmal atrial fibrillation on Xarelto , hypertension, hyperlipidemia, CKD, chronic lymphedema, left femoral tibial bypass graft, COPD, who presents to the emergency department shortness of breath.  States that he is not been feeling well for the past 3 days.  Complaining of cough, shortness of breath and feeling poorly.  On arrival to the emergency department patient with obvious respiratory distress.  82% on room air, was placed on 6 L nasal cannula, was tachycardic and tachypneic.     Physical Exam   Triage Vital Signs: ED Triage Vitals  Encounter Vitals Group     BP 01/12/24 1008 (!) 140/86     Girls Systolic BP Percentile --      Girls Diastolic BP Percentile --      Boys Systolic BP Percentile --      Boys Diastolic BP Percentile --      Pulse Rate 01/12/24 0959 (!) 130     Resp 01/12/24 0959 (!) 30     Temp --      Temp src --      SpO2 01/12/24 0959 (!) 82 %     Weight --      Height --      Head Circumference --      Peak Flow --      Pain Score --      Pain Loc --      Pain Education --      Exclude from Growth Chart --     Most recent vital signs: Vitals:   01/12/24 1155 01/12/24 1230  BP:  97/64  Pulse:  100  Resp:  19  Temp: 98 F (36.7 C)   SpO2:  100%    Physical Exam Constitutional:      Appearance: He is well-developed. He is ill-appearing.  HENT:     Head: Atraumatic.  Eyes:     Conjunctiva/sclera: Conjunctivae normal.     Pupils: Pupils are equal, round, and reactive to light.  Neck:     Vascular: No JVD.  Cardiovascular:     Rate and Rhythm: Regular rhythm.  Pulmonary:     Effort: Tachypnea, accessory muscle  usage and respiratory distress present.     Breath sounds: Wheezing and rhonchi present.  Musculoskeletal:     Cervical back: Normal range of motion.     Right lower leg: Edema present.     Left lower leg: Edema present.     Comments: Mild edema to bilateral lower extremities.  Significant lymphedema to the left lower extremities that is increased in size when compared to the right which is chronic according to the patient.  Skin:    General: Skin is warm.  Neurological:     Mental Status: He is alert. Mental status is at baseline.     IMPRESSION / MDM / ASSESSMENT AND PLAN / ED COURSE  I reviewed the triage vital signs and the nursing notes.  On arrival patient was tachycardic, tachypneic and hypoxic.  Overall ill-appearing.  Immediately taken back to her room.  Asking nursing staff to check a temperature now  Differential diagnosis including sepsis, COPD, heart failure exacerbation, viral  illness including COVID/influenza, pneumonia, ACS, anemia  Audible wheezing on exam, given DuoNeb treatment, IV Solu-Medrol , placed on high flow nasal cannula.  Given continuous DuoNebs  EKG  I, Clotilda Punter, the attending physician, personally viewed and interpreted this ECG.  EKG is read as atrial fibrillation with a rapid rate.  Difficult to discern P waves.  Will obtain a repeat EKG, uncertain if he does have A-fib, appears to have a P wave that is present in V2.  QRS wide at 150.  QTc 563  Repeat EKG continues to read atrial fibrillation however on my evaluation appears to have P waves that are present in V2 and V1 that just appear prolonged.  Have a low suspicion for new onset atrial fibrillation.   Sinus tachycardia while on cardiac to telemetry  RADIOLOGY I independently reviewed imaging, my interpretation of imaging: Chest x-ray with findings concerning for right lower lobe pneumonia.  Read as asymmetric pulmonary edema versus right lower lobe pneumonia.  LABS (all labs ordered are  listed, but only abnormal results are displayed) Labs interpreted as -    Labs Reviewed  CBC - Abnormal; Notable for the following components:      Result Value   WBC 10.8 (*)    All other components within normal limits  BLOOD GAS, VENOUS - Abnormal; Notable for the following components:   pO2, Ven 66 (*)    Bicarbonate 29.8 (*)    Acid-Base Excess 4.3 (*)    All other components within normal limits  COMPREHENSIVE METABOLIC PANEL WITH GFR - Abnormal; Notable for the following components:   Glucose, Bld 139 (*)    BUN 33 (*)    Creatinine, Ser 2.12 (*)    GFR, Estimated 31 (*)    All other components within normal limits  BRAIN NATRIURETIC PEPTIDE - Abnormal; Notable for the following components:   B Natriuretic Peptide 1,021.4 (*)    All other components within normal limits  PROTIME-INR - Abnormal; Notable for the following components:   Prothrombin Time 35.3 (*)    INR 3.3 (*)    All other components within normal limits  TROPONIN I (HIGH SENSITIVITY) - Abnormal; Notable for the following components:   Troponin I (High Sensitivity) 20 (*)    All other components within normal limits  RESP PANEL BY RT-PCR (RSV, FLU A&B, COVID)  RVPGX2  CULTURE, BLOOD (ROUTINE X 2)  CULTURE, BLOOD (ROUTINE X 2)  LACTIC ACID, PLASMA  LACTIC ACID, PLASMA  URINALYSIS, W/ REFLEX TO CULTURE (INFECTION SUSPECTED)  TROPONIN I (HIGH SENSITIVITY)     MDM  Afebrile, significant tachycardia and hypoxia.  Initial blood pressure 140/86, repeat blood pressure 105/76  Clinical picture is most concerning for sepsis.  Started on antibiotics to cover for pneumonia with Pseudomonas risk factors.  Initially ordered azithromycin  however on his EKG did have prolonged QTc at 563.  Discontinued his azithromycin  and ordered IV doxycycline  however the azithromycin  had already been given.  Do not feel that doxycycline  needs to be given in addition to the azithromycin  given covering atypical pneumonia.  On chart  review no history of MRSA.  Mild leukocytosis.  Lactic acid 1.5.  INR elevated at 3.3.  VBG with no significant hypercarbia.  Creatinine appears to be at his baseline.  Troponin mildly elevated likely in the setting of demand ischemia.  Have low suspicion for ACS.  Patient remains on high flow nasal cannula to 100%.  Felt that 30 cc/kg of IV fluids may be detrimental to the  patient given his history of CHF, given a 150 infusion and will reevaluate.  Consulted hospitalist for admission for acute hypoxic respiratory failure  Clinical Course as of 01/12/24 1312  Tue Jan 12, 2024  1212 BNP significantly elevated.  Patient with soft blood pressures we will hold on IV Lasix  at this time. [SM]  1221 Discussed with hospitalist will order a CTA which they will follow-up to further evaluate for pulmonary embolism. [SM]    Clinical Course User Index [SM] Suzanne Kirsch, MD     PROCEDURES:  Critical Care performed: yes  .Critical Care  Performed by: Suzanne Kirsch, MD Authorized by: Suzanne Kirsch, MD   Critical care provider statement:    Critical care time (minutes):  30   Critical care time was exclusive of:  Separately billable procedures and treating other patients   Critical care was necessary to treat or prevent imminent or life-threatening deterioration of the following conditions:  Respiratory failure   Critical care was time spent personally by me on the following activities:  Development of treatment plan with patient or surrogate, discussions with consultants, evaluation of patient's response to treatment, examination of patient, ordering and review of laboratory studies, ordering and review of radiographic studies, ordering and performing treatments and interventions, pulse oximetry, re-evaluation of patient's condition and review of old charts   Patient's presentation is most consistent with acute presentation with potential threat to life or bodily function.   MEDICATIONS ORDERED  IN ED: Medications  lactated ringers  infusion (has no administration in time range)  ceFEPIme  (MAXIPIME ) 2 g in sodium chloride  0.9 % 100 mL IVPB (0 g Intravenous Stopped 01/12/24 1130)  ipratropium-albuterol  (DUONEB) 0.5-2.5 (3) MG/3ML nebulizer solution 9 mL (9 mLs Nebulization Given 01/12/24 1106)  methylPREDNISolone  sodium succinate (SOLU-MEDROL ) 125 mg/2 mL injection 125 mg (125 mg Intravenous Given 01/12/24 1107)    FINAL CLINICAL IMPRESSION(S) / ED DIAGNOSES   Final diagnoses:  Hypoxia  SOB (shortness of breath)  Community acquired pneumonia of right lower lobe of lung     Rx / DC Orders   ED Discharge Orders     None        Note:  This document was prepared using Dragon voice recognition software and may include unintentional dictation errors.   Suzanne Kirsch, MD 01/12/24 1203    Suzanne Kirsch, MD 01/12/24 1221    Suzanne Kirsch, MD 01/12/24 1312

## 2024-01-12 NOTE — H&P (Signed)
 History and Physical    Patient: [name] FMW:[id number] DOB: 06-Oct-1940 DOA: 01/12/2024 DOS: the patient was seen and examined on 01/12/2024 PCP: Sharma Coyer, MD  Patient coming from: Home  Chief Complaint:  Chief Complaint  Patient presents with   Shortness of Breath   HPI: [name] is a 83 y.o. male with history of non-ischemic dilated cardiomyopathy, chronic systolic congestive heart failure with LVEF 40%, with chronic, stable exertional dyspnea, with chronic lymphedema, worse after left  femoral-tibial bypass and femoral endarterectomy, paroxysmal atrial fibrillation on Xarelto , COPD not on home oxygen presents to the emergency department for evaluation of 5 days of worsening shortness of breath  with associated dry cough and runny nose, and swelling into his abdomen.SABRA  He reports compliance with all of his medications including his diuretic and Xarelto  and denies changes in his urine output.  He denies fevers, dizziness, abdominal pain, nausea, vomiting, diarrhea.  On arrival he was saturating 82% on room air, and placed on 45% high flow nasal cannula.  He was then rate controlled atrial fibrillation, with soft but stable blood pressure. his most recent Caridiology office visit note mentions low BP as well.  He had slight leukocytosis of 10.8 and was afebrile.  Creatinine appears near baseline.  BNP was greater than 1000.  CXR with either asymmetric pulmonary edema or bronco pneumonia in the right lung base.  Will admit for further management of acute hypoxic respiratory failure secondary to possible pneumonia and systolic heart failure exacerbation.  Of note, patient confirmed he is DNR and requested to speak with someone from hospice for when he goes home.    Review of Systems: Review of Systems  Constitutional:  Negative for chills, diaphoresis, fever, malaise/fatigue and weight loss.  HENT:  Positive for congestion and sore throat. Negative for  sinus pain.   Eyes:  Negative for blurred vision.  Respiratory:  Positive for cough, shortness of breath and wheezing. Negative for hemoptysis and sputum production.   Cardiovascular:  Positive for leg swelling. Negative for chest pain and palpitations.  Gastrointestinal:  Negative for abdominal pain, blood in stool, constipation, diarrhea, heartburn, nausea and vomiting.  Genitourinary:  Negative for dysuria, frequency, hematuria and urgency.  Musculoskeletal:  Negative for falls and myalgias.  Skin:  Negative for itching and rash.  Neurological:  Negative for dizziness, tremors, speech change and headaches.  Psychiatric/Behavioral:  Negative for suicidal ideas.     Past Medical History:  Diagnosis Date   A-fib [facility])    a.) CHA2DS2VASc = 5 (age x2, CHF, HTN, vascular disease history);  b.) rate/rhythm maintained on oral amiodarone ; chronically anticoagulated with rivaroxaban    Allergy    Arthritis    CAD (coronary artery disease) 11/17/2012   a.) LHC 11/17/2012: 30% mLM, 30% mLAD, 30% mLCx, 50%/30% OM1, 30% pRCA - med mgmt   Chronic systolic CHF (congestive heart failure), NYHA class 2 (HCC)    a.) TTE 05/30/2015: EF 40%, glob HK, mild LVH, mild LAE, mild MR/TR; b.) TTE 10/14/2017: EF 40%, glob HK, mild LVH, mod BAE, triv PR, mild MR/TR, G1DD   Complication of anesthesia    a.) hypotension during routine colonoscopy   COPD (chronic obstructive pulmonary disease) (HCC)    DDD (degenerative disc disease), lumbar    Dilated cardiomyopathy (HCC)    a.) LHC 11/17/2012: EF 35%; b.) TTE 05/30/2015: EF 40%; c.) TTE 10/14/2017: EF 40%   GERD (gastroesophageal reflux disease)    Grade I diastolic dysfunction  History of chicken pox    Hyperlipidemia    Hypertension    Hypothyroidism    Left bundle branch block    Long term current use of amiodarone     Long term current use of anticoagulant    a.) Rivaroxaban    Osteoporosis    Peripheral vascular disease (HCC)    Pre-diabetes     Spinal stenosis of lumbar region without neurogenic claudication    Stage 3b chronic kidney disease (HCC)    Wears dentures    full upper and lower   Past Surgical History:  Procedure Laterality Date   BACK SURGERY     x5   CATARACT EXTRACTION W/PHACO Left 11/24/2023   Procedure: PHACOEMULSIFICATION, CATARACT, WITH IOL INSERTION 29.25 02:17.6;  Surgeon: Jaye Fallow, MD;  Location: [facility];  Service: Ophthalmology;  Laterality: Left;   CATARACT EXTRACTION W/PHACO Right 12/08/2023   Procedure: PHACOEMULSIFICATION, CATARACT, WITH IOL INSERTION 9.35 01:00.7;  Surgeon: Jaye Fallow, MD;  Location: [facility];  Service: Ophthalmology;  Laterality: Right;   DOPPLER ECHOCARDIOGRAPHY  06/21/2013   EF=35% while in Sinus Rhythm   ENDARTERECTOMY FEMORAL Left 10/22/2022   Procedure: ENDARTERECTOMY FEMORAL;  Surgeon: Jama Cordella MATSU, MD;  Location: [facility] ORS;  Service: Vascular;  Laterality: Left;   FEMORAL-TIBIAL BYPASS GRAFT Left 10/22/2022   Procedure: BYPASS GRAFT FEMORAL-TIBIAL ARTERY;  Surgeon: Jama Cordella MATSU, MD;  Location: [facility] ORS;  Service: Vascular;  Laterality: Left;   LEFT HEART CATH AND CORONARY ANGIOGRAPHY Left 11/17/2012   Procedure: LEFT HEART CATHETERIZATION AND CORONARY ANGIOGRAPHY; Location: [facility]; Surgeon: Marsa Dooms, MD   LOWER EXTREMITY ANGIOGRAPHY Right 05/20/2022   Procedure: Lower Extremity Angiography;  Surgeon: Jama Cordella MATSU, MD;  Location: [facility] INVASIVE CV LAB;  Service: Cardiovascular;  Laterality: Right;   LOWER EXTREMITY ANGIOGRAPHY Left 07/15/2022   Procedure: Lower Extremity Angiography;  Surgeon: Jama Cordella MATSU, MD;  Location: [facility] INVASIVE CV LAB;  Service: Cardiovascular;  Laterality: Left;   Myocardial perfusion scan  10/28/2012   severe global LV enlargement. Mild RV enlargement. Mild LVH. Mild mitral and tricuspid insufficency   polyp excision  09/28/2005   UPPER GASTROINTESTINAL ENDOSCOPY  03/27/2004    Gastropathy, Gastritis; Duodenopathy   Social History:  reports that he quit smoking about 16 years ago. His smoking use included cigarettes. He started smoking about 66 years ago. He has a 100 pack-year smoking history. He has never used smokeless tobacco. He reports that he does not drink alcohol and does not use drugs.  No Known Allergies  Family History  Problem Relation Age of Onset   Alzheimer's disease Mother    Heart attack Father    Colon cancer Brother     Prior to Admission medications   Medication Sig Start Date End Date Taking? Authorizing Provider  amiodarone  (PACERONE ) 200 MG tablet Take 1 tablet (200 mg total) by mouth daily. 01/27/23   [name], Rockie, MD  clopidogrel  (PLAVIX ) 75 MG tablet Take 1 tablet (75 mg total) by mouth daily. 10/28/22   Pace, Gwendlyn SAUNDERS, NP  FEROSUL 325 (65 Fe) MG tablet TAKE 1 TABLET BY MOUTH DAILY WITH BREAKFAST 01/07/24   [name], [name], MD  fluticasone  (FLONASE ) 50 MCG/ACT nasal spray Place 2 sprays into both nostrils daily. Patient taking differently: Place 2 sprays into both nostrils daily as needed. 10/02/21   Emilio Kelly DASEN, FNP  gabapentin  (NEURONTIN ) 300 MG capsule Take 1 capsule (300 mg total) by mouth 2 (two) times daily. 09/24/21   Marcelino Nurse, MD  levothyroxine  (  SYNTHROID ) 100 MCG tablet TAKE 1 TABLET BY MOUTH DAILY BEFORE BREAKFAST 01/27/23   [name], [name], MD  lisinopril  (ZESTRIL ) 2.5 MG tablet Take 1 tablet (2.5 mg total) by mouth daily. 01/27/23   [name], Rockie, MD  lovastatin  (MEVACOR ) 20 MG tablet Take 1 tablet (20 mg total) by mouth at bedtime. 10/28/23   [name], [name], MD  mupirocin  ointment (BACTROBAN ) 2 % Apply 1 Application topically daily. 11/20/22   Brown, [name], NP  naloxone [facility]) nasal spray 4 mg/0.1 mL SMARTSIG:Both Nares 10/27/22   [provider]  torsemide  (DEMADEX ) 20 MG tablet Take 1 tablet (20 mg total) by mouth 2 (two) times daily. 04/29/23    [name], Rockie, MD  traMADol  (ULTRAM ) 50 MG tablet Take 50 mg by mouth 2 (two) times daily as needed. 09/18/21   [provider]  XARELTO  20 MG TABS tablet Take 1 tablet (20 mg total) by mouth daily. 01/27/23   Sharma Rockie, MD    Physical Exam: Vitals:   01/12/24 1100 01/12/24 1130 01/12/24 1155 01/12/24 1230  BP: 105/76 95/62  97/64  Pulse: (!) 119 (!) 105  100  Resp: 19 16  19   Temp:   98 F (36.7 C)   TempSrc:   Oral   SpO2: 100% 100%  100%   Physical Exam Vitals reviewed.  Constitutional:      General: He is not in acute distress.    Appearance: He is well-developed. He is not ill-appearing or toxic-appearing.  HENT:     Head: Normocephalic and atraumatic.  Eyes:     Extraocular Movements: Extraocular movements intact.     Pupils: Pupils are equal, round, and reactive to light.  Cardiovascular:     Rate and Rhythm: Tachycardia present. Rhythm irregular.  Pulmonary:     Effort: Pulmonary effort is normal. No tachypnea or accessory muscle usage.     Breath sounds: Examination of the right-middle field reveals rales. Examination of the left-middle field reveals rales. Examination of the right-lower field reveals rales. Examination of the left-lower field reveals rales. Rhonchi (diffuse) and rales present.  Musculoskeletal:     Right lower leg: Edema present.     Left lower leg: Edema present.     Comments: Left lower extremity chronically more swollen  Skin:    General: Skin is warm and dry.     Capillary Refill: Capillary refill takes less than 2 seconds.     Comments: B/l venous stasis changes to about mid calf, worse on left which is chronic, with superficial skin tear on the lateral right lower extremity  Neurological:     Mental Status: He is alert and oriented to person, place, and time.     Cranial Nerves: No cranial nerve deficit.  Psychiatric:        Mood and Affect: Mood normal.        Behavior: Behavior normal.     Data  Reviewed:    Labs on Admission: I have personally reviewed following labs and imaging studies  CBC: Recent Labs  Lab 01/12/24 1010  WBC 10.8*  HGB 13.4  HCT 42.0  MCV 95.7  PLT 290   Basic Metabolic Panel: Recent Labs  Lab 01/12/24 1016  NA 138  K 3.8  CL 98  CO2 27  GLUCOSE 139*  BUN 33*  CREATININE 2.12*  CALCIUM 9.1   GFR: CrCl cannot be calculated (Unknown ideal weight.). Liver Function Tests: Recent Labs  Lab 01/12/24 1016  AST 33  ALT 26  ALKPHOS  102  BILITOT 1.1  PROT 7.0  ALBUMIN 3.7   No results for input(s): LIPASE, AMYLASE in the last 168 hours. No results for input(s): AMMONIA in the last 168 hours. Coagulation Profile: Recent Labs  Lab 01/12/24 1023  INR 3.3*   Cardiac Enzymes: No results for input(s): CKTOTAL, CKMB, CKMBINDEX, TROPONINI in the last 168 hours. BNP (last 3 results) No results for input(s): PROBNP in the last 8760 hours. HbA1C: No results for input(s): HGBA1C in the last 72 hours. CBG: No results for input(s): GLUCAP in the last 168 hours. Lipid Profile: No results for input(s): CHOL, HDL, LDLCALC, TRIG, CHOLHDL, LDLDIRECT in the last 72 hours. Thyroid  Function Tests: No results for input(s): TSH, T4TOTAL, FREET4, T3FREE, THYROIDAB in the last 72 hours. Anemia Panel: No results for input(s): VITAMINB12, FOLATE, FERRITIN, TIBC, IRON, RETICCTPCT in the last 72 hours. Urine analysis:    Component Value Date/Time   COLORURINE YELLOW (A) 10/25/2022 2040   APPEARANCEUR CLEAR (A) 10/25/2022 2040   LABSPEC 1.010 10/25/2022 2040   PHURINE 5.0 10/25/2022 2040   GLUCOSEU NEGATIVE 10/25/2022 2040   HGBUR LARGE (A) 10/25/2022 2040   BILIRUBINUR NEGATIVE 10/25/2022 2040   KETONESUR NEGATIVE 10/25/2022 2040   PROTEINUR NEGATIVE 10/25/2022 2040   NITRITE NEGATIVE 10/25/2022 2040   LEUKOCYTESUR NEGATIVE 10/25/2022 2040    Radiological Exams on Admission: DG Chest Portable  1 View Result Date: 01/12/2024 CLINICAL DATA:  sob EXAM: PORTABLE CHEST - 1 VIEW COMPARISON:  Nov 26, 2021 FINDINGS: Bilateral perihilar interstitial opacities. Patchy opacities in the right lung base. No pleural effusion or pneumothorax. Mild cardiomegaly. Tortuous aorta with aortic atherosclerosis. No acute fracture or destructive lesions. Multilevel thoracic osteophytosis. IMPRESSION: Mild cardiomegaly with findings of either asymmetric pulmonary edema or bronchopneumonia in the right lung base. Electronically Signed   By: Rogelia Myers M.D.   On: 01/12/2024 10:56       Assessment and Plan: No notes have been filed under this hospital service. Service: Hospitalist  83 y.o. male with history of non-ischemic dilated cardiomyopathy, chronic systolic congestive heart failure with LVEF 40%, with chronic, stable exertional dyspnea, with chronic lymphedema, worse after left  femoral-tibial bypass and femoral endarterectomy, paroxysmal atrial fibrillation on Xarelto , COPD not on home oxygen admitted for acute hypoxic respiratory failure in the setting of possible pneumonia and exacerbation of systolic heart failure and COPD.  Acute hypoxic respiratory failure Right basilar pneumonia (CAP vs aspiration)  Acute exacerbation chronic systolic heart failure (EF <30% on ECHO 09/16/2023 per Care everywhere)  COPD exacerbation - Currently on 45% high flow nasal cannula, saturating 100%.  Wean as tolerated to maintain greater than 92%.  He has no baseline requirement - rocehpin/azithromycin  for now (pending SLP evaluation)  -Sputum, blood, and respiratory culture pending - IV diuresis with bumex  1mg  BID; monitor daily weights, intake and output - Pulmicort , DuoNebs  Paroxysmal atrial fibrillation - Currently rate controlled.  -  Continue amiodarone  and Xarelto   CKDIV -Seems to be at baseline, will continue to monitor - Hold lisinopril  for now in light of low-normal blood pressures and aggressive  diuresis   Lymphedema Atherosclerosis both lower extremities status post left femoral-tibial bypass   - Continue Plavix , statin, diuresis as above  Hypothyroidism - Continue Synthroid , check TSH  Xarelto  Heart healthy diet 1800 cc fluid restriction No IVF Monitor/replace electrolytes  Advance Care Planning:   Code Status: Prior discussed with patient at the time of admission.  He is requesting hospice consult as well   Severity of  Illness: The appropriate patient status for this patient is INPATIENT. Inpatient status is judged to be reasonable and necessary in order to provide the required intensity of service to ensure the patient's safety. The patient's presenting symptoms, physical exam findings, and initial radiographic and laboratory data in the context of their chronic comorbidities is felt to place them at high risk for further clinical deterioration. Furthermore, it is not anticipated that the patient will be medically stable for discharge from the hospital within 2 midnights of admission.   * I certify that at the point of admission it is my clinical judgment that the patient will require inpatient hospital care spanning beyond 2 midnights from the point of admission due to high intensity of service, high risk for further deterioration and high frequency of surveillance required.*  Author: Curtisha Bendix C Bennie Scaff, DO 01/12/2024 1:00 PM  For on call review [email or web address].

## 2024-01-12 NOTE — Consult Note (Addendum)
 CODE SEPSIS - PHARMACY COMMUNICATION  **Broad Spectrum Antibiotics should be administered within 1 hour of Sepsis diagnosis**  Time Code Sepsis Called/Page Received: 1023  Antibiotics Ordered: azithromycin , cefepime   Time of 1st antibiotic administration: 1106  Additional action taken by pharmacy: n/a  If necessary, Name of Provider/Nurse Contacted: n/a    [name] A Audel Coakley ,PharmD Clinical Pharmacist  01/12/2024  11:27 AM

## 2024-01-13 DIAGNOSIS — J9601 Acute respiratory failure with hypoxia: Secondary | ICD-10-CM | POA: Diagnosis not present

## 2024-01-13 LAB — CBC
HCT: 39 % (ref 39.0–52.0)
Hemoglobin: 12.6 g/dL — ABNORMAL LOW (ref 13.0–17.0)
MCH: 30.6 pg (ref 26.0–34.0)
MCHC: 32.3 g/dL (ref 30.0–36.0)
MCV: 94.7 fL (ref 80.0–100.0)
Platelets: 213 K/uL (ref 150–400)
RBC: 4.12 MIL/uL — ABNORMAL LOW (ref 4.22–5.81)
RDW: 15.2 % (ref 11.5–15.5)
WBC: 8.3 K/uL (ref 4.0–10.5)
nRBC: 0 % (ref 0.0–0.2)

## 2024-01-13 LAB — LEGIONELLA PNEUMOPHILA SEROGP 1 UR AG: L. pneumophila Serogp 1 Ur Ag: NEGATIVE

## 2024-01-13 LAB — RESPIRATORY PANEL BY PCR

## 2024-01-13 LAB — BASIC METABOLIC PANEL WITH GFR
Anion gap: 11 (ref 5–15)
BUN: 42 mg/dL — ABNORMAL HIGH (ref 8–23)
CO2: 27 mmol/L (ref 22–32)
Calcium: 9.1 mg/dL (ref 8.9–10.3)
Chloride: 101 mmol/L (ref 98–111)
Creatinine, Ser: 2.21 mg/dL — ABNORMAL HIGH (ref 0.61–1.24)
GFR, Estimated: 29 mL/min — ABNORMAL LOW (ref >60)
Glucose, Bld: 122 mg/dL — ABNORMAL HIGH (ref 70–99)
Potassium: 4.1 mmol/L (ref 3.5–5.1)
Sodium: 139 mmol/L (ref 135–145)

## 2024-01-13 LAB — EXPECTORATED SPUTUM ASSESSMENT W GRAM STAIN, RFLX TO RESP C

## 2024-01-13 MED ORDER — AZITHROMYCIN 250 MG PO TABS
500.0000 mg | ORAL_TABLET | Freq: Every day | ORAL | Status: DC
  Administered 2024-01-14 – 2024-01-15 (×2): 500 mg via ORAL
  Filled 2024-01-13 (×2): qty 2

## 2024-01-13 MED ORDER — TORSEMIDE 20 MG PO TABS
20.0000 mg | ORAL_TABLET | Freq: Two times a day (BID) | ORAL | Status: DC
  Filled 2024-01-13: qty 1

## 2024-01-13 NOTE — Progress Notes (Signed)
 PHARMACIST - PHYSICIAN COMMUNICATION DR:   Barbarann CONCERNING: Antibiotic IV to Oral Route Change Policy  RECOMMENDATION: This patient is receiving Azithromycin  by the intravenous route.  Based on criteria approved by the Pharmacy and Therapeutics Committee, the antibiotic(s) is/are being converted to the equivalent oral dose form(s).   DESCRIPTION: These criteria include: Patient being treated for a respiratory tract infection, urinary tract infection, cellulitis or clostridium difficile associated diarrhea if on metronidazole The patient is not neutropenic and does not exhibit a GI malabsorption state The patient is eating (either orally or via tube) and/or has been taking other orally administered medications for a least 24 hours The patient is improving clinically and has a Tmax < 100.5   Gerrod Maule Rodriguez-Guzman PharmD, BCPS 01/13/2024 1:57 PM

## 2024-01-13 NOTE — Plan of Care (Signed)
  Problem: Activity: Goal: Risk for activity intolerance will decrease Outcome: Progressing   Problem: Coping: Goal: Level of anxiety will decrease Outcome: Progressing   Problem: Elimination: Goal: Will not experience complications related to bowel motility Outcome: Progressing Goal: Will not experience complications related to urinary retention Outcome: Progressing   Problem: Pain Managment: Goal: General experience of comfort will improve and/or be controlled Outcome: Progressing   Problem: Safety: Goal: Ability to remain free from injury will improve Outcome: Progressing

## 2024-01-13 NOTE — Progress Notes (Signed)
 Progress Note   Patient: [name]. FMW:[id number] DOB: 05/16/1941 DOA: 01/12/2024     1 DOS: the patient was seen and examined on 01/13/2024   Brief hospital course: 83yo with h/o chronic HFrEF, lymphedema, PAD s/p L femoral-tibial bypass/femaorl endarterectomy, afib on Xarelto , and COPD not on home O2 who presented on 7/15 with SOB.  He was 82% on RA and started on HFNC O2.  He was found to have a RLL PNA with concomitant COPD/CHF exacerbation.  Assessment and Plan:  Acute hypoxic respiratory failure associated with RLL pneumonia (CAP vs aspiration)  Patient presented with deep chest congestion, cough CXR with RLL PNA O2 sats 82%, started on HFNC O2 -> [place] O2 (not on home O2) Continue Ceftriaxone /Azithromycin  x 5 days Sputum, blood, and respiratory culture pending  Chronic systolic heart failure 09/16/2023 echo with EF 25-30%, mild MR/TR He reports sleeping upright in a chair at home but reports that this is due to chronic back pain rather than orthostasis He has chronic BLE lymphedema Appears compensated at this time IV diuresis with bumex  1mg  BID on admission, will not continue at this time but will resume home torsemide  in the AM  COPD Does not appear to be an active exacerbation at this time Stop Pulmicort  nebs Continue DuoNebs prn   Paroxysmal atrial fibrillation Rate controlled with amiodarone   Continue Xarelto    Stage IV CKD Seems to be at baseline Hold lisinopril  given advanced CKD    Lymphedema Chronic, stable Complicates ability to asess  Atherosclerosis both lower extremities status post left femoral-tibial bypass   Continue Plavix , lovastatin  Reports probable need for SNF rehab post-hospitalization (lives with his son, who works)   Hypothyroidism Continue Synthroid   DNR DNR confirmed at the time of admission [facility] documents reviewed Patient will need a gold out of facility DNR form at the time of discharge  He has also requested to go home  with hospice at the time of discharge - will discuss with patient     Consultants: CHF team RT [facility] team  Procedures: None  Antibiotics: Azithromycin  7/15-20 Cefepime  x 1 Ceftriaxone  7/16-21  30 Day Unplanned Readmission Risk Score    Flowsheet Row ED to Hosp-Admission (Current) from 01/12/2024 in [facility]  30 Day Unplanned Readmission Risk Score (%) 15.44 Filed at 01/13/2024 0401    This score is the patient's risk of an unplanned readmission within 30 days of being discharged (0 -100%). The score is based on dignosis, age, lab data, medications, orders, and past utilization.   Low:  0-14.9   Medium: 15-21.9   High: 22-29.9   Extreme: 30 and above           Subjective: On HFNC this AM and requesting to downgrade (currently on [employer] O2).  Reports starting to feel some better.   Objective: Vitals:   01/13/24 1454 01/13/24 1652  BP:  93/62  Pulse:  98  Resp:  20  Temp:  97.9 F (36.6 C)  SpO2: 98% 100%    Intake/Output Summary (Last 24 hours) at 01/13/2024 1736 Last data filed at 01/13/2024 1700 Gross per 24 hour  Intake 1491.51 ml  Output 850 ml  Net 641.51 ml   Filed Weights   01/12/24 1230 01/13/24 0500  Weight: 113 kg 108.9 kg    Exam:  General:  Appears calm and comfortable and is in NAD, on HFNC O2, sitting up in bedside chair Eyes:  normal lids, iris ENT:  grossly normal hearing, lips &  tongue, mmm Cardiovascular:  RRR. No LE edema.  Respiratory:   Scattered rhonchi.  Normal respiratory effort. Abdomen:  soft, NT, ND Skin:  no rash or induration seen on limited exam Musculoskeletal:  grossly normal tone BUE/BLE, good ROM, no bony abnormality Psychiatric:  grossly normal mood and affect, speech fluent and appropriate, AOx3 Neurologic:  CN 2-12 grossly intact, moves all extremities in coordinated fashion  Data Reviewed: I have reviewed the patient's lab results since admission.  Pertinent labs for today  include:   Glucose 122 BUN 42/Creatinine 2.21/GFR 29, stable WBC 8.4 Hgb 12.6 Procalcitonin <0.10 BNP 1021.4 INR 3.3 TSH 2.316 COVID/flu/RSV negative RVP pending UA normal Blood cultures pending Sputum culture pending    Family Communication: None present  Disposition: Status is: Inpatient Remains inpatient appropriate because: ongoing management     Time spent: 50 minutes  Unresulted Labs (From admission, onward)     Start     Ordered   01/14/24 0500  CBC with Differential/Platelet  Tomorrow morning,   R       Question:  Specimen collection method  Answer:  Lab=Lab collect   01/13/24 1736   01/14/24 0500  Basic metabolic panel with GFR  Tomorrow morning,   R       Question:  Specimen collection method  Answer:  Lab=Lab collect   01/13/24 1736   01/13/24 1132  Culture, Respiratory w Gram Stain  Once,   R        01/13/24 1132             Author: Delon Herald, MD 01/13/2024 5:36 PM  For on call review [email or web address].

## 2024-01-13 NOTE — Plan of Care (Signed)
  Problem: Education: Goal: Knowledge of General Education information will improve Description: Including pain rating scale, medication(s)/side effects and non-pharmacologic comfort measures Outcome: Progressing   Problem: Health Behavior/Discharge Planning: Goal: Ability to manage health-related needs will improve Outcome: Progressing   Problem: Clinical Measurements: Goal: Ability to maintain clinical measurements within normal limits will improve Outcome: Progressing Goal: Will remain free from infection Outcome: Progressing Goal: Diagnostic test results will improve Outcome: Progressing Goal: Respiratory complications will improve Outcome: Progressing Goal: Cardiovascular complication will be avoided Outcome: Progressing   Problem: Activity: Goal: Risk for activity intolerance will decrease Outcome: Progressing   Problem: Nutrition: Goal: Adequate nutrition will be maintained Outcome: Progressing   Problem: Coping: Goal: Level of anxiety will decrease Outcome: Progressing   Problem: Elimination: Goal: Will not experience complications related to bowel motility Outcome: Progressing Goal: Will not experience complications related to urinary retention Outcome: Progressing   Problem: Pain Managment: Goal: General experience of comfort will improve and/or be controlled Outcome: Progressing   Problem: Safety: Goal: Ability to remain free from injury will improve Outcome: Progressing   Problem: Skin Integrity: Goal: Risk for impaired skin integrity will decrease Outcome: Progressing   Problem: Education: Goal: Ability to demonstrate management of disease process will improve Outcome: Progressing Goal: Ability to verbalize understanding of medication therapies will improve Outcome: Progressing Goal: Individualized Educational Video(s) Outcome: Progressing   Problem: Activity: Goal: Capacity to carry out activities will improve Outcome: Progressing    Problem: Cardiac: Goal: Ability to achieve and maintain adequate cardiopulmonary perfusion will improve Outcome: Progressing   Problem: Education: Goal: Knowledge of disease or condition will improve Outcome: Progressing Goal: Knowledge of the prescribed therapeutic regimen will improve Outcome: Progressing Goal: Individualized Educational Video(s) Outcome: Progressing   Problem: Activity: Goal: Ability to tolerate increased activity will improve Outcome: Progressing Goal: Will verbalize the importance of balancing activity with adequate rest periods Outcome: Progressing   Problem: Respiratory: Goal: Ability to maintain a clear airway will improve Outcome: Progressing Goal: Levels of oxygenation will improve Outcome: Progressing Goal: Ability to maintain adequate ventilation will improve Outcome: Progressing   Problem: Activity: Goal: Ability to tolerate increased activity will improve Outcome: Progressing   Problem: Clinical Measurements: Goal: Ability to maintain a body temperature in the normal range will improve Outcome: Progressing   Problem: Respiratory: Goal: Ability to maintain adequate ventilation will improve Outcome: Progressing Goal: Ability to maintain a clear airway will improve Outcome: Progressing

## 2024-01-13 NOTE — Hospital Course (Signed)
 83yo with h/o chronic HFrEF, lymphedema, PAD s/p L femoral-tibial bypass/femaorl endarterectomy, afib on Xarelto , and COPD not on home O2 who presented on 7/15 with SOB.  He was 82% on RA and started on HFNC O2.  He was found to have a RLL PNA with concomitant COPD/CHF exacerbation.

## 2024-01-14 DIAGNOSIS — J9601 Acute respiratory failure with hypoxia: Secondary | ICD-10-CM | POA: Diagnosis not present

## 2024-01-14 LAB — CBC WITH DIFFERENTIAL/PLATELET
Abs Immature Granulocytes: 0.16 K/uL — ABNORMAL HIGH (ref 0.00–0.07)
Basophils Absolute: 0 K/uL (ref 0.0–0.1)
Basophils Relative: 0 %
Eosinophils Absolute: 0.1 K/uL (ref 0.0–0.5)
Eosinophils Relative: 1 %
HCT: 38.3 % — ABNORMAL LOW (ref 39.0–52.0)
Hemoglobin: 12.4 g/dL — ABNORMAL LOW (ref 13.0–17.0)
Immature Granulocytes: 2 %
Lymphocytes Relative: 11 %
Lymphs Abs: 0.9 K/uL (ref 0.7–4.0)
MCH: 31 pg (ref 26.0–34.0)
MCHC: 32.4 g/dL (ref 30.0–36.0)
MCV: 95.8 fL (ref 80.0–100.0)
Monocytes Absolute: 1 K/uL (ref 0.1–1.0)
Monocytes Relative: 12 %
Neutro Abs: 6.4 K/uL (ref 1.7–7.7)
Neutrophils Relative %: 74 %
Platelets: 185 K/uL (ref 150–400)
RBC: 4 MIL/uL — ABNORMAL LOW (ref 4.22–5.81)
RDW: 15.2 % (ref 11.5–15.5)
WBC: 8.6 K/uL (ref 4.0–10.5)
nRBC: 0 % (ref 0.0–0.2)

## 2024-01-14 LAB — BASIC METABOLIC PANEL WITH GFR
Anion gap: 11 (ref 5–15)
BUN: 53 mg/dL — ABNORMAL HIGH (ref 8–23)
CO2: 27 mmol/L (ref 22–32)
Calcium: 8.8 mg/dL — ABNORMAL LOW (ref 8.9–10.3)
Chloride: 99 mmol/L (ref 98–111)
Creatinine, Ser: 2.15 mg/dL — ABNORMAL HIGH (ref 0.61–1.24)
GFR, Estimated: 30 mL/min — ABNORMAL LOW (ref >60)
Glucose, Bld: 96 mg/dL (ref 70–99)
Potassium: 3.6 mmol/L (ref 3.5–5.1)
Sodium: 137 mmol/L (ref 135–145)

## 2024-01-14 NOTE — Care Management Important Message (Signed)
 Important Message  Patient Details  Name: [name] WILER Sr. MRN: [id number] Date of Birth: 1940-07-25   Important Message Given:  Yes - Medicare IM     [name] 01/14/2024, 1:29 PM

## 2024-01-14 NOTE — Plan of Care (Signed)
   Problem: Education: Goal: Knowledge of General Education information will improve Description Including pain rating scale, medication(s)/side effects and non-pharmacologic comfort measures Outcome: Progressing

## 2024-01-14 NOTE — Evaluation (Signed)
 Occupational Therapy Evaluation Patient Details Name: [name] PETION Sr. MRN: [id number] DOB: [birth year] Today's Date: 01/14/2024   History of Present Illness   83yo with h/o chronic HFrEF, lymphedema, PAD s/p L femoral-tibial bypass/femaorl endarterectomy, afib on Xarelto , and COPD not on home O2 who presented on 7/15 with SOB.  He was 82% on RA and started on HFNC O2.  He was found to have a RLL PNA with concomitant COPD/CHF exacerbation.     Clinical Impressions Pt was seen for OT evaluation this date, handoff from PT. Prior to hospital admission, pt was MODI amb with RW for all ADL/IADL tasks, driving self to appointments. Pt lives with his son who works full time, pt reports being alone for most of the day. Pt presents to acute OT demonstrating impaired ADL performance and functional mobility 2/2 (See OT problem list for additional functional deficits). Pt currently requires setup assistance for seated oral care tasks. Pt tolerated x3 STS reps with RW + CGA from the recliner to progress in functional mobility as pt is very motivated to return to his independence. Pt deferred further mobility due to quick fatigue after STS attempts, requiring 0.5L oxygen support to recover to <90% - RN notified, as  was doffed prior to OT session - MD in aware. Pt left with all needs in reach. Pt would benefit from skilled OT services to address noted impairments and functional limitations (see below for any additional details) in order to maximize safety and independence while minimizing falls risk and caregiver burden. OT will follow acutely.     If plan is discharge home, recommend the following:   A little help with walking and/or transfers;A little help with bathing/dressing/bathroom;Assistance with cooking/housework;Help with stairs or ramp for entrance     Functional Status Assessment   Patient has had a recent decline in their functional status and demonstrates the ability to make significant  improvements in function in a reasonable and predictable amount of time.     Equipment Recommendations   Other (comment) (Defer to next venue of care)     Recommendations for Other Services         Precautions/Restrictions   Precautions Precautions: Fall Recall of Precautions/Restrictions: Intact Restrictions Weight Bearing Restrictions Per Provider Order: No     Mobility Bed Mobility               General bed mobility comments: NT in recliner pre/post session    Transfers Overall transfer level: Needs assistance Equipment used: Rolling walker (2 wheels) Transfers: Sit to/from Stand Sit to Stand: Contact guard assist           General transfer comment: x3 STS rep from seated position in recliner, CGA throughout with use of RW, MIN verbal cues for technique.      Balance Overall balance assessment: Needs assistance Sitting-balance support: Feet supported, No upper extremity supported Sitting balance-Leahy Scale: Fair Sitting balance - Comments: Steady reaching within BOS   Standing balance support: During functional activity, Reliant on assistive device for balance Standing balance-Leahy Scale: Poor Standing balance comment: Limited standing tolerance, fatigues quickly                           ADL either performed or assessed with clinical judgement   ADL Overall ADL's : Needs assistance/impaired Eating/Feeding: Sitting;Modified independent   Grooming: Oral care;Wash/dry face;Wash/dry hands;Sitting;Set up               Lower Body  Dressing: Supervision/safety (Figure 4 method)   Toilet Transfer: Contact guard assist;Rolling walker (2 wheels) (Simulated)           Functional mobility during ADLs: Contact guard assist;Rolling walker (2 wheels);Cueing for safety General ADL Comments: Eager to brush teeth, set up assistance only. CGA simulated toilet t/f from recliner     Vision         Perception         Praxis          Pertinent Vitals/Pain Pain Assessment Pain Assessment: No/denies pain     Extremity/Trunk Assessment Upper Extremity Assessment Upper Extremity Assessment: Generalized weakness;Right hand dominant   Lower Extremity Assessment Lower Extremity Assessment: Defer to PT evaluation;Generalized weakness   Cervical / Trunk Assessment Cervical / Trunk Assessment: Normal   Communication Communication Communication: No apparent difficulties   Cognition Arousal: Alert Behavior During Therapy: WFL for tasks assessed/performed Cognition: No apparent impairments             OT - Cognition Comments: A/Ox4                 Following commands: Intact       Cueing  General Comments   Cueing Techniques: Verbal cues  Bil lymphedema L>R   Exercises Exercises: Other exercises Other Exercises Other Exercises: Edu: Role of OT eval, safe ADL completion, edama management when sitting for prolong time, safe transfer technqiue   Shoulder Instructions      Home Living Family/patient expects to be discharged to:: Private residence Living Arrangements: Children Available Help at Discharge: Family;Available PRN/intermittently;Other (Comment) (Alone during the day) Type of Home: House Home Access: Level entry     Home Layout: One level     Bathroom Shower/Tub: Tub/shower unit         Home Equipment: Rollator (4 wheels);Grab bars - tub/shower          Prior Functioning/Environment Prior Level of Function : Driving;History of Falls (last six months);Independent/Modified Independent               ADLs Comments: Reports indep with all ADL/IADL    OT Problem List: Decreased strength;Decreased activity tolerance;Impaired balance (sitting and/or standing);Decreased coordination;Decreased safety awareness;Decreased knowledge of precautions;Decreased knowledge of use of DME or AE   OT Treatment/Interventions: Self-care/ADL training;Therapeutic exercise;Energy  conservation;Therapeutic activities;Patient/family education;Balance training;DME and/or AE instruction      OT Goals(Current goals can be found in the care plan section)   Acute Rehab OT Goals Patient Stated Goal: Increase indepence OT Goal Formulation: With patient Time For Goal Achievement: 01/28/24 Potential to Achieve Goals: Good ADL Goals Pt Will Perform Grooming: standing;with supervision Pt Will Perform Lower Body Dressing: with modified independence;sit to/from stand Pt Will Transfer to Toilet: ambulating;with supervision Pt Will Perform Toileting - Clothing Manipulation and hygiene: sit to/from stand;with contact guard assist   OT Frequency:  Min 2X/week    Co-evaluation              AM-PAC OT 6 Clicks Daily Activity     Outcome Measure Help from another person eating meals?: A Little Help from another person taking care of personal grooming?: A Little Help from another person toileting, which includes using toliet, bedpan, or urinal?: A Little Help from another person bathing (including washing, rinsing, drying)?: A Lot Help from another person to put on and taking off regular upper body clothing?: A Little Help from another person to put on and taking off regular lower body clothing?: A Little 6 Click Score:  17   End of Session Equipment Utilized During Treatment: Gait belt;Oxygen Nurse Communication: Mobility status  Activity Tolerance: Patient tolerated treatment well Patient left: in chair;with call bell/phone within reach;with chair alarm set  OT Visit Diagnosis: Unsteadiness on feet (R26.81);Other abnormalities of gait and mobility (R26.89);Muscle weakness (generalized) (M62.81)                Time: 9063-8996 OT Time Calculation (min): 27 min Charges:  OT General Charges $OT Visit: 1 Visit OT Evaluation $OT Eval Moderate Complexity: 1 Mod OT Treatments $Self Care/Home Management : 8-22 mins  Larraine Colas M.S. OTR/L  01/14/24, 10:53 AM

## 2024-01-14 NOTE — Evaluation (Addendum)
 Physical Therapy Evaluation Patient Details Name: [name] RUTIGLIANO Sr. MRN: [id number] DOB: 09-02-1940 Today's Date: 01/14/2024  History of Present Illness  83yo with h/o chronic HFrEF, lymphedema, PAD s/p L femoral-tibial bypass/femaorl endarterectomy, afib on Xarelto , and COPD not on home O2 who presented on 7/15 with SOB.  He was 82% on RA and started on HFNC O2.  He was found to have a RLL PNA with concomitant COPD/CHF exacerbation.  Clinical Impression  Pt is a pleasant 83 year old male who was admitted for ARF with complaints of SOB symptoms. Pt initially on 3L upon arrival, weaned to RA during session. Pt performs transfers with min assist and ambulation with CGA and RW. Initially stood with pt's own hurri-cane, however when attempted ambulation, pt very unsteady. Returned to recliner and re-attempted ambulation with RW. At this time, encouraged pt to use RW for all mobility to decrease risk of falls. Uses rollator at baseline. Pt demonstrates deficits with strength/mobility/endurance/balance. Slight increased HR and increased WOB with limited in room ambulation. Pt is currently not at baseline level and has very limited support at home. If more support available, pt could potentially go home at time of discharge, depending on progress. Would benefit from skilled PT to address above deficits and promote optimal return to PLOF. Pt will continue to receive skilled PT services while admitted and will defer to TOC/care team for updates regarding disposition planning.  SaO2 on room air at rest = 98% SaO2 on room air while ambulating = 92%, HR at 123bpm SaO2 on n/a liters of O2 while ambulating = n/a%        If plan is discharge home, recommend the following: A little help with walking and/or transfers;A little help with bathing/dressing/bathroom   Can travel by private vehicle   Yes    Equipment Recommendations Rolling walker (2 wheels)  Recommendations for Other Services        Functional Status Assessment Patient has had a recent decline in their functional status and demonstrates the ability to make significant improvements in function in a reasonable and predictable amount of time.     Precautions / Restrictions Precautions Precautions: Fall Recall of Precautions/Restrictions: Intact Restrictions Weight Bearing Restrictions Per Provider Order: No      Mobility  Bed Mobility               General bed mobility comments: NT in recliner pre/post session    Transfers Overall transfer level: Needs assistance Equipment used: Rolling walker (2 wheels) (hurri-cane) Transfers: Sit to/from Stand Sit to Stand: Min assist           General transfer comment: takes several attempts to stand from low chair. Able to perform with [facility] and then further attempts with RW. O2 removed and all of session performed on RA    Ambulation/Gait Ambulation/Gait assistance: Contact guard assist Gait Distance (Feet): 40 Feet Assistive device: Rolling walker (2 wheels) (hurri-cane) Gait Pattern/deviations: Step-through pattern, Shuffle       General Gait Details: ambulated in room with increased effort and WOB. Very slow and shuffle gait pattern. Attempted a few steps with hurri-cane, however pt high risk for falls. Further ambulation performed with RW with improved balance  Stairs            Wheelchair Mobility     Tilt Bed    Modified Rankin (Stroke Patients Only)       Balance Overall balance assessment: Needs assistance Sitting-balance support: Feet supported, No upper extremity supported Sitting balance-Leahy  Scale: Fair Sitting balance - Comments: Steady reaching within BOS   Standing balance support: During functional activity, Reliant on assistive device for balance Standing balance-Leahy Scale: Poor                               Pertinent Vitals/Pain Pain Assessment Pain Assessment: No/denies pain    Home Living  Family/patient expects to be discharged to:: Private residence Living Arrangements: Children Available Help at Discharge: Family;Available PRN/intermittently;Other (Comment) (alone during the day) Type of Home: House Home Access: Level entry       Home Layout: One level Home Equipment: Rollator (4 wheels);Grab bars - tub/shower (hurri-cane)      Prior Function Prior Level of Function : Driving;History of Falls (last six months);Independent/Modified Independent             Mobility Comments: indep, driving, no falls. Was using rollator for all mobility ADLs Comments: Reports indep with all ADL/IADL     Extremity/Trunk Assessment   Upper Extremity Assessment Upper Extremity Assessment: Generalized weakness    Lower Extremity Assessment Lower Extremity Assessment: Generalized weakness (B LE grossly 3+/5 and increased edema)    Cervical / Trunk Assessment Cervical / Trunk Assessment: Normal  Communication   Communication Communication: No apparent difficulties    Cognition Arousal: Alert Behavior During Therapy: WFL for tasks assessed/performed   PT - Cognitive impairments: No apparent impairments                       PT - Cognition Comments: pleasant and agreeable to session Following commands: Intact       Cueing Cueing Techniques: Verbal cues     General Comments General comments (skin integrity, edema, etc.): Bil lymphedema L>R    Exercises     Assessment/Plan    PT Assessment Patient needs continued PT services  PT Problem List Decreased strength;Decreased balance;Decreased mobility;Cardiopulmonary status limiting activity       PT Treatment Interventions DME instruction;Gait training;Therapeutic exercise;Balance training    PT Goals (Current goals can be found in the Care Plan section)  Acute Rehab PT Goals Patient Stated Goal: to go to SNF PT Goal Formulation: With patient Time For Goal Achievement: 01/28/24 Potential to Achieve  Goals: Good    Frequency Min 2X/week     Co-evaluation               AM-PAC PT 6 Clicks Mobility  Outcome Measure Help needed turning from your back to your side while in a flat bed without using bedrails?: A Little Help needed moving from lying on your back to sitting on the side of a flat bed without using bedrails?: A Little Help needed moving to and from a bed to a chair (including a wheelchair)?: A Little Help needed standing up from a chair using your arms (e.g., wheelchair or bedside chair)?: A Little Help needed to walk in hospital room?: A Little Help needed climbing 3-5 steps with a railing? : A Lot 6 Click Score: 17    End of Session Equipment Utilized During Treatment: Oxygen Activity Tolerance: Patient tolerated treatment well Patient left: in chair;with chair alarm set (with OT at bedside) Nurse Communication: Mobility status PT Visit Diagnosis: Muscle weakness (generalized) (M62.81);Unsteadiness on feet (R26.81)    Time: 9084-9060 PT Time Calculation (min) (ACUTE ONLY): 24 min   Charges:   PT Evaluation $PT Eval Low Complexity: 1 Low PT Treatments $Gait Training: 8-22 mins  PT General Charges $$ ACUTE PT VISIT: 1 Visit         Corean Dade, PT, DPT, GCS [phone]   Samanthia Howland 01/14/2024, 11:02 AM

## 2024-01-14 NOTE — Plan of Care (Signed)
  Problem: Education: Goal: Knowledge of General Education information will improve Description: Including pain rating scale, medication(s)/side effects and non-pharmacologic comfort measures Outcome: Progressing   Problem: Health Behavior/Discharge Planning: Goal: Ability to manage health-related needs will improve Outcome: Progressing   Problem: Clinical Measurements: Goal: Ability to maintain clinical measurements within normal limits will improve Outcome: Progressing Goal: Will remain free from infection Outcome: Progressing Goal: Diagnostic test results will improve Outcome: Progressing Goal: Respiratory complications will improve Outcome: Progressing Goal: Cardiovascular complication will be avoided Outcome: Progressing   Problem: Activity: Goal: Risk for activity intolerance will decrease Outcome: Progressing   Problem: Nutrition: Goal: Adequate nutrition will be maintained Outcome: Progressing   Problem: Coping: Goal: Level of anxiety will decrease Outcome: Progressing   Problem: Elimination: Goal: Will not experience complications related to bowel motility Outcome: Progressing Goal: Will not experience complications related to urinary retention Outcome: Progressing   Problem: Pain Managment: Goal: General experience of comfort will improve and/or be controlled Outcome: Progressing   Problem: Safety: Goal: Ability to remain free from injury will improve Outcome: Progressing   Problem: Skin Integrity: Goal: Risk for impaired skin integrity will decrease Outcome: Progressing   Problem: Education: Goal: Ability to demonstrate management of disease process will improve Outcome: Progressing Goal: Ability to verbalize understanding of medication therapies will improve Outcome: Progressing Goal: Individualized Educational Video(s) Outcome: Progressing   Problem: Activity: Goal: Capacity to carry out activities will improve Outcome: Progressing    Problem: Cardiac: Goal: Ability to achieve and maintain adequate cardiopulmonary perfusion will improve Outcome: Progressing   Problem: Education: Goal: Knowledge of disease or condition will improve Outcome: Progressing Goal: Knowledge of the prescribed therapeutic regimen will improve Outcome: Progressing Goal: Individualized Educational Video(s) Outcome: Progressing   Problem: Activity: Goal: Ability to tolerate increased activity will improve Outcome: Progressing Goal: Will verbalize the importance of balancing activity with adequate rest periods Outcome: Progressing   Problem: Respiratory: Goal: Ability to maintain a clear airway will improve Outcome: Progressing Goal: Levels of oxygenation will improve Outcome: Progressing Goal: Ability to maintain adequate ventilation will improve Outcome: Progressing   Problem: Activity: Goal: Ability to tolerate increased activity will improve Outcome: Progressing   Problem: Clinical Measurements: Goal: Ability to maintain a body temperature in the normal range will improve Outcome: Progressing   Problem: Respiratory: Goal: Ability to maintain adequate ventilation will improve Outcome: Progressing Goal: Ability to maintain a clear airway will improve Outcome: Progressing

## 2024-01-14 NOTE — Progress Notes (Signed)
 Heart Failure Navigator Progress Note  Assessed for Heart & Vascular TOC clinic readiness.  Patient does not meet criteria due to [facility] patient.  Navigator will sign off at this time.   Roxy Horseman, RN, BSN [facility] Heart Failure Navigator Secure Chat Only

## 2024-01-14 NOTE — Progress Notes (Signed)
 Progress Note   Patient: [name] PLOG Sr. FMW:[id number] DOB: 06/13/1941 DOA: 01/12/2024     2 DOS: the patient was seen and examined on 01/14/2024   Brief hospital course: 83yo with h/o chronic HFrEF, lymphedema, PAD s/p L femoral-tibial bypass/femaorl endarterectomy, afib on Xarelto , and COPD not on home O2 who presented on 7/15 with SOB.  He was 82% on RA and started on HFNC O2.  He was found to have a RLL PNA with concomitant COPD/CHF exacerbation.  Assessment and Plan:  Acute hypoxic respiratory failure associated with RLL pneumonia (CAP vs aspiration)  Patient presented with deep chest congestion, cough CXR with RLL PNA O2 sats 82%, started on HFNC O2 -> [place] O2 (not on home O2) Continue Ceftriaxone /Azithromycin  x 5 days Sputum, blood, and respiratory culture pending RVP positive for parainfluenza virus, which appears to be the source of his symptoms SLP has evaluated and recommends aspiration precautions   Chronic systolic heart failure 09/16/2023 echo with EF 25-30%, mild MR/TR He reports sleeping upright in a chair at home but reports that this is due to chronic back pain rather than orthostasis He has chronic BLE lymphedema Appears compensated at this time IV diuresis with bumex  1mg  BID on admission, which was stopped on 7/16 [place] BPs so home torsemide  is also held   COPD Does not appear to be an active exacerbation at this time Stop Pulmicort  nebs Continue DuoNebs prn   Paroxysmal atrial fibrillation Rate controlled with amiodarone   Continue Xarelto    Stage IV CKD Seems to be at baseline Hold lisinopril  given advanced CKD    Lymphedema Chronic, stable Complicates ability to assess volume status   Atherosclerosis both lower extremities status post left femoral-tibial bypass   Continue Plavix , lovastatin    Hypothyroidism Continue Synthroid    DNR DNR confirmed at the time of admission [facility] documents reviewed Patient will need a gold out of facility  DNR form at the time of discharge  He has also requested to go home with hospice at the time of discharge - will discuss with patient  Disposition  Reports probable need for SNF rehab post-hospitalization (lives with his son, who works)  PT/OT consulting and agree [facility] team is aware He may be medically stable as early as 7/18        Consultants:  SLP PT OT TOC team   Procedures: None   Antibiotics: Azithromycin  7/15-20 Cefepime  x 1 Ceftriaxone  7/16-21   30 Day Unplanned Readmission Risk Score    Flowsheet Row ED to Hosp-Admission (Current) from 01/12/2024 in [facility]  30 Day Unplanned Readmission Risk Score (%) 16.98 Filed at 01/14/2024 0400    This score is the patient's risk of an unplanned readmission within 30 days of being discharged (0 -100%). The score is based on dignosis, age, lab data, medications, orders, and past utilization.   Low:  0-14.9   Medium: 15-21.9   High: 22-29.9   Extreme: 30 and above           Subjective: Feeling better.  On RA this AM when seen (between PT and OT evaluations), although it appears [place] O2 was resumed in the interim.   Objective: Vitals:   01/14/24 0800 01/14/24 0820  BP: (!) 90/56 (!) 90/56  Pulse: 82 88  Resp: 18 16  Temp:  (!) 97 F (36.1 C)  SpO2: 99% 100%    Intake/Output Summary (Last 24 hours) at 01/14/2024 1511 Last data filed at 01/14/2024 0900 Gross per 24 hour  Intake 1180 ml  Output 925 ml  Net 255 ml   Filed Weights   01/12/24 1230 01/13/24 0500 01/14/24 0346  Weight: 113 kg 108.9 kg 109.6 kg    Exam:  General:  Appears calm and comfortable and is in NAD, on RA, sitting up in bedside chair Eyes:  normal lids, iris ENT:  grossly normal hearing, lips & tongue, mmm; absent dentition Cardiovascular:  RRR. No LE edema.  Respiratory:   CTAB.  Normal respiratory effort. Abdomen:  soft, NT, ND Skin:  no rash or induration seen on limited exam Musculoskeletal:   grossly normal tone BUE/BLE, good ROM, no bony abnormality Psychiatric:  grossly normal mood and affect, speech fluent and appropriate, AOx3 Neurologic:  CN 2-12 grossly intact, moves all extremities in coordinated fashion  Data Reviewed: I have reviewed the patient's lab results since admission.  Pertinent labs for today include:   BUN 53/Creatinine 2.15/GFR 30 WBC 8.6 Hgb 12.4 RVP + parainfluenza virus 3    Family Communication: None present; I spoke with his daughter by telephone  Disposition: Status is: Inpatient Remains inpatient appropriate because: ongoing monitoring     Time spent: 50 minutes  Unresulted Labs (From admission, onward)     Start     Ordered   01/15/24 0500  CBC with Differential/Platelet  Tomorrow morning,   R       Question:  Specimen collection method  Answer:  Lab=Lab collect   01/14/24 1511   01/15/24 0500  Basic metabolic panel with GFR  Tomorrow morning,   R       Question:  Specimen collection method  Answer:  Lab=Lab collect   01/14/24 1511             Author: Delon Herald, MD 01/14/2024 3:11 PM  For on call review [email or web address].

## 2024-01-15 DIAGNOSIS — J439 Emphysema, unspecified: Secondary | ICD-10-CM | POA: Diagnosis not present

## 2024-01-15 DIAGNOSIS — K5901 Slow transit constipation: Secondary | ICD-10-CM | POA: Diagnosis not present

## 2024-01-15 DIAGNOSIS — I2510 Atherosclerotic heart disease of native coronary artery without angina pectoris: Secondary | ICD-10-CM | POA: Diagnosis not present

## 2024-01-15 DIAGNOSIS — I10 Essential (primary) hypertension: Secondary | ICD-10-CM | POA: Diagnosis not present

## 2024-01-15 DIAGNOSIS — I70213 Atherosclerosis of native arteries of extremities with intermittent claudication, bilateral legs: Secondary | ICD-10-CM | POA: Diagnosis not present

## 2024-01-15 DIAGNOSIS — Z515 Encounter for palliative care: Secondary | ICD-10-CM

## 2024-01-15 DIAGNOSIS — I130 Hypertensive heart and chronic kidney disease with heart failure and stage 1 through stage 4 chronic kidney disease, or unspecified chronic kidney disease: Secondary | ICD-10-CM | POA: Diagnosis not present

## 2024-01-15 DIAGNOSIS — I420 Dilated cardiomyopathy: Secondary | ICD-10-CM | POA: Diagnosis not present

## 2024-01-15 DIAGNOSIS — I739 Peripheral vascular disease, unspecified: Secondary | ICD-10-CM | POA: Diagnosis not present

## 2024-01-15 DIAGNOSIS — Z951 Presence of aortocoronary bypass graft: Secondary | ICD-10-CM | POA: Diagnosis not present

## 2024-01-15 DIAGNOSIS — J449 Chronic obstructive pulmonary disease, unspecified: Secondary | ICD-10-CM | POA: Diagnosis not present

## 2024-01-15 DIAGNOSIS — J441 Chronic obstructive pulmonary disease with (acute) exacerbation: Secondary | ICD-10-CM | POA: Diagnosis not present

## 2024-01-15 DIAGNOSIS — J189 Pneumonia, unspecified organism: Secondary | ICD-10-CM

## 2024-01-15 DIAGNOSIS — E039 Hypothyroidism, unspecified: Secondary | ICD-10-CM | POA: Diagnosis not present

## 2024-01-15 DIAGNOSIS — I5022 Chronic systolic (congestive) heart failure: Secondary | ICD-10-CM

## 2024-01-15 DIAGNOSIS — L03119 Cellulitis of unspecified part of limb: Secondary | ICD-10-CM | POA: Diagnosis not present

## 2024-01-15 DIAGNOSIS — J9601 Acute respiratory failure with hypoxia: Secondary | ICD-10-CM | POA: Diagnosis not present

## 2024-01-15 DIAGNOSIS — M5451 Vertebrogenic low back pain: Secondary | ICD-10-CM | POA: Diagnosis not present

## 2024-01-15 DIAGNOSIS — M6281 Muscle weakness (generalized): Secondary | ICD-10-CM | POA: Diagnosis not present

## 2024-01-15 DIAGNOSIS — I890 Lymphedema, not elsewhere classified: Secondary | ICD-10-CM | POA: Diagnosis not present

## 2024-01-15 DIAGNOSIS — J101 Influenza due to other identified influenza virus with other respiratory manifestations: Secondary | ICD-10-CM | POA: Diagnosis not present

## 2024-01-15 DIAGNOSIS — I480 Paroxysmal atrial fibrillation: Secondary | ICD-10-CM | POA: Diagnosis not present

## 2024-01-15 DIAGNOSIS — J122 Parainfluenza virus pneumonia: Secondary | ICD-10-CM | POA: Diagnosis not present

## 2024-01-15 DIAGNOSIS — E785 Hyperlipidemia, unspecified: Secondary | ICD-10-CM | POA: Diagnosis not present

## 2024-01-15 DIAGNOSIS — I4821 Permanent atrial fibrillation: Secondary | ICD-10-CM | POA: Diagnosis not present

## 2024-01-15 DIAGNOSIS — I4811 Longstanding persistent atrial fibrillation: Secondary | ICD-10-CM | POA: Diagnosis not present

## 2024-01-15 DIAGNOSIS — I4891 Unspecified atrial fibrillation: Secondary | ICD-10-CM | POA: Diagnosis not present

## 2024-01-15 DIAGNOSIS — N184 Chronic kidney disease, stage 4 (severe): Secondary | ICD-10-CM | POA: Diagnosis not present

## 2024-01-15 DIAGNOSIS — R2689 Other abnormalities of gait and mobility: Secondary | ICD-10-CM | POA: Diagnosis not present

## 2024-01-15 DIAGNOSIS — I70203 Unspecified atherosclerosis of native arteries of extremities, bilateral legs: Secondary | ICD-10-CM | POA: Diagnosis not present

## 2024-01-15 LAB — CBC WITH DIFFERENTIAL/PLATELET
Abs Immature Granulocytes: 0.25 K/uL — ABNORMAL HIGH (ref 0.00–0.07)
Basophils Absolute: 0.1 K/uL (ref 0.0–0.1)
Basophils Relative: 1 %
Eosinophils Absolute: 0.3 K/uL (ref 0.0–0.5)
Eosinophils Relative: 3 %
HCT: 41.2 % (ref 39.0–52.0)
Hemoglobin: 13.1 g/dL (ref 13.0–17.0)
Immature Granulocytes: 3 %
Lymphocytes Relative: 8 %
Lymphs Abs: 0.7 K/uL (ref 0.7–4.0)
MCH: 30.5 pg (ref 26.0–34.0)
MCHC: 31.8 g/dL (ref 30.0–36.0)
MCV: 96 fL (ref 80.0–100.0)
Monocytes Absolute: 0.9 K/uL (ref 0.1–1.0)
Monocytes Relative: 11 %
Neutro Abs: 6.3 K/uL (ref 1.7–7.7)
Neutrophils Relative %: 74 %
Platelets: 188 K/uL (ref 150–400)
RBC: 4.29 MIL/uL (ref 4.22–5.81)
RDW: 15.3 % (ref 11.5–15.5)
WBC: 8.5 K/uL (ref 4.0–10.5)
nRBC: 0 % (ref 0.0–0.2)

## 2024-01-15 LAB — BASIC METABOLIC PANEL WITH GFR
Anion gap: 8 (ref 5–15)
BUN: 51 mg/dL — ABNORMAL HIGH (ref 8–23)
CO2: 30 mmol/L (ref 22–32)
Calcium: 9.2 mg/dL (ref 8.9–10.3)
Chloride: 99 mmol/L (ref 98–111)
Creatinine, Ser: 1.95 mg/dL — ABNORMAL HIGH (ref 0.61–1.24)
GFR, Estimated: 34 mL/min — ABNORMAL LOW (ref >60)
Glucose, Bld: 108 mg/dL — ABNORMAL HIGH (ref 70–99)
Potassium: 4.6 mmol/L (ref 3.5–5.1)
Sodium: 137 mmol/L (ref 135–145)

## 2024-01-15 MED ORDER — TRAMADOL HCL 50 MG PO TABS: 50.0000 mg | ORAL_TABLET | Freq: Two times a day (BID) | ORAL | 0 refills | Status: DC | PRN

## 2024-01-15 MED ORDER — GABAPENTIN 300 MG PO CAPS: 300.0000 mg | ORAL_CAPSULE | Freq: Two times a day (BID) | ORAL | 0 refills | Status: DC

## 2024-01-15 MED ORDER — AMOXICILLIN-POT CLAVULANATE 875-125 MG PO TABS: 1.0000 | ORAL_TABLET | Freq: Two times a day (BID) | ORAL | Status: AC

## 2024-01-15 NOTE — Plan of Care (Signed)

## 2024-01-15 NOTE — TOC Initial Note (Signed)
 Transition of Care (TOC) - Initial/Assessment Note    Patient Details  Name: [name] CALIENDO Sr. MRN: [id number] Date of Birth: 1941/06/05  Transition of Care [facility]) CM/SW Contact:    Corean ONEIDA Haddock, RN Phone Number: 01/15/2024, 9:11 AM  Clinical Narrative:                  Therapy recommends SNF.  Per MD patient in agreement PASRR obtained Fl2 sent for signature Bed search initiated  TOC to follow up with patient to complete full assessment       Patient Goals and CMS Choice            Expected Discharge Plan and Services                                              Prior Living Arrangements/Services                       Activities of Daily Living   ADL Screening (condition at time of admission) Independently performs ADLs?: No  Permission Sought/Granted                  Emotional Assessment              Admission diagnosis:  SOB (shortness of breath) [R06.02] Hypoxia [R09.02] Community acquired pneumonia of right lower lobe of lung [J18.9] Acute hypoxic respiratory failure (HCC) [J96.01] Patient Active Problem List   Diagnosis Date Noted   Acute hypoxic respiratory failure (HCC) 01/12/2024   Pulmonary emphysema (HCC) 06/18/2023   Atherosclerosis of native arteries of extremity with intermittent claudication (HCC) 04/23/2023   Lymphedema 04/23/2023   Anemia due to stage 3b chronic kidney disease (HCC) 01/27/2023   Healthcare maintenance 01/27/2023   Atherosclerosis of artery of extremity with ulceration (HCC) 10/22/2022   Atherosclerosis of native arteries of the extremities with ulceration (HCC) 08/13/2022   Hypotension 07/23/2022   Chronic systolic heart failure (HCC) 07/23/2022   Atherosclerosis of native artery of both lower extremities (HCC) 03/18/2022   Ulcer of right lower extremity, limited to breakdown of skin (HCC) 02/19/2022   Permanent atrial fibrillation (HCC) 10/17/2021   Other acute postprocedural pain  10/17/2021   PND (post-nasal drip) 10/02/2021   Lumbar degenerative disc disease 08/13/2021   Failed back surgical syndrome 08/13/2021   Chronic right SI joint pain 08/13/2021   Edema, unspecified 07/25/2021   Elevated serum creatinine 06/12/2021   Stage 3b chronic kidney disease (HCC) 06/12/2021   Longstanding persistent atrial fibrillation (HCC) 06/12/2021   Colonoscopy refused 06/12/2021   CHF (congestive heart failure), NYHA class II, unspecified failure chronicity, combined (HCC) 06/12/2021   Use of cane as ambulatory aid 06/12/2021   Chronic kidney disease (CKD), stage IV (severe) (HCC) 06/12/2021   Lumbar radiculopathy, chronic 02/13/2020   SI joint arthritis (HCC) 02/13/2020   Pre-diabetes 03/18/2017   Hypothyroidism 12/19/2014   Hypertension 12/19/2014   Dilated cardiomyopathy (HCC) 02/17/2013   S/P cardiac cath 02/17/2013   PCP:  Sharma Coyer, MD Pharmacy:   [facility] [phone],  - [address] AT [facility] [address] [place] [address] Phone: [phone] Fax: [phone]     Social Drivers of Health (SDOH) Social History: SDOH Screenings   Food Insecurity: No Food Insecurity (01/12/2024)  Housing: Unknown (  01/12/2024)  Transportation Needs: No Transportation Needs (01/12/2024)  Utilities: Not At Risk (01/13/2024)  Alcohol Screen: Low Risk  (10/06/2023)  Depression (PHQ2-9): Low Risk  (12/28/2023)  Financial Resource Strain: Low Risk  (12/28/2023)  Physical Activity: Sufficiently Active (10/06/2023)  Social Connections: Unknown (01/15/2024)  Stress: No Stress Concern Present (12/28/2023)  Tobacco Use: Medium Risk (12/29/2023)   Received from [facility]  Health Literacy: Adequate Health Literacy (10/06/2023)   SDOH Interventions:     Readmission Risk Interventions     No data to display

## 2024-01-15 NOTE — Discharge Summary (Signed)
 Physician Discharge Summary   Patient: [name] STREIGHT Sr. MRN: [id number] DOB: Apr 15, 1941  Admit date:     01/12/2024  Discharge date: 01/15/24  Discharge Physician: Delon Herald   PCP: Sharma Coyer, MD   Recommendations at discharge:   You are being discharged to [facility] for rehabilitation Complete antibiotics (Augmentin  twice daily x 2 more days) Follow up with Dr. Sharma after discharge from rehab Follow up with cardiology on 7/23 as scheduled Palliative care can be consulted as an outpatient if desired  Discharge Diagnoses: Principal Problem:   Acute hypoxic respiratory failure (HCC) Active Problems:   Longstanding persistent atrial fibrillation (HCC)   Atherosclerosis of native artery of both lower extremities (HCC)   Permanent atrial fibrillation (HCC)   Chronic kidney disease (CKD), stage IV (severe) (HCC)   Chronic systolic heart failure (HCC)   Lymphedema   Pulmonary emphysema [facility])    Hospital Course: 83yo with h/o chronic HFrEF, lymphedema, PAD s/p L femoral-tibial bypass/femaorl endarterectomy, afib on Xarelto , and COPD not on home O2 who presented on 7/15 with SOB.  He was 82% on RA and started on HFNC O2.  He was found to have a RLL PNA with concomitant COPD/CHF exacerbation.  Assessment and Plan:  Acute hypoxic respiratory failure associated with RLL pneumonia (CAP vs aspiration)  Patient presented with deep chest congestion, cough CXR with RLL PNA O2 sats 82%, started on HFNC O2 -> [place] O2 (not on home O2) Continue Ceftriaxone /Azithromycin  -> Augmentin  x 5 days Sputum, blood, and respiratory culture pending RVP positive for parainfluenza virus, which appears to be the source of his symptoms SLP has evaluated and recommends aspiration precautions   Chronic systolic heart failure 09/16/2023 echo with EF 25-30%, mild MR/TR He reports sleeping upright in a chair at home but reports that this is due to chronic back pain rather  than orthostasis He has chronic BLE lymphedema Appears compensated at this time IV diuresis with bumex  1mg  BID on admission, which was stopped on 7/16 [place] BPs so home torsemide  is also held   COPD Does not appear to be an active exacerbation at this time Stop Pulmicort  nebs Continue DuoNebs prn   Paroxysmal atrial fibrillation Rate controlled with amiodarone   Continue Xarelto    Stage IV CKD Seems to be at baseline Hold lisinopril  given advanced CKD    Lymphedema Chronic, stable Complicates ability to assess volume status Continue tramadol , gabapentin  as needed for pain   Atherosclerosis both lower extremities status post left femoral-tibial bypass   Continue Plavix , lovastatin    Hypothyroidism Continue Synthroid    DNR DNR confirmed at the time of admission [facility] documents reviewed Patient will need a gold out of facility DNR form at the time of discharge (placed on chart) Palliative care consulted, can follow up with palliative as an outpatient if desired   Disposition  Reported probable need for SNF rehab post-hospitalization (lives with his son, who works)  PT/OT consulted and agreed [facility] team is aware He is medically stable for discharge and has been offered a bed           Consultants:   SLP PT OT TOC team   Procedures: None   Antibiotics: Azithromycin  7/15-18 Cefepime  x 1 Ceftriaxone  7/16-18   Pain control - [place]  Controlled Substance Reporting System database was reviewed. and patient was instructed, not to drive, operate heavy machinery, perform activities at heights, swimming or participation in water activities or provide baby-sitting services while on Pain, Sleep and Anxiety Medications; until their outpatient  Physician has advised to do so again. Also recommended to not to take more than prescribed Pain, Sleep and Anxiety Medications.   Disposition: Skilled nursing facility Diet recommendation:  Cardiac diet DISCHARGE  MEDICATION: Allergies as of 01/15/2024   No Known Allergies      Medication List     PAUSE taking these medications    lisinopril  2.5 MG tablet Wait to take this until your doctor or other care provider tells you to start again. Commonly known as: ZESTRIL  Take 1 tablet (2.5 mg total) by mouth daily.   torsemide  20 MG tablet Wait to take this until your doctor or other care provider tells you to start again. Commonly known as: DEMADEX  Take 1 tablet (20 mg total) by mouth 2 (two) times daily.       TAKE these medications    amiodarone  200 MG tablet Commonly known as: PACERONE  Take 1 tablet (200 mg total) by mouth daily.   amoxicillin -clavulanate 875-125 MG tablet Commonly known as: AUGMENTIN  Take 1 tablet by mouth 2 (two) times daily for 2 days. Start taking on: January 16, 2024   clopidogrel  75 MG tablet Commonly known as: PLAVIX  Take 1 tablet (75 mg total) by mouth daily.   FeroSul 325 (65 Fe) MG tablet Generic drug: ferrous sulfate  TAKE 1 TABLET BY MOUTH DAILY WITH BREAKFAST   fluticasone  50 MCG/ACT nasal spray Commonly known as: FLONASE  Place 2 sprays into both nostrils daily. What changed:  when to take this reasons to take this   gabapentin  300 MG capsule Commonly known as: Neurontin  Take 1 capsule (300 mg total) by mouth 2 (two) times daily.   levothyroxine  100 MCG tablet Commonly known as: SYNTHROID  TAKE 1 TABLET BY MOUTH DAILY BEFORE BREAKFAST   lovastatin  20 MG tablet Commonly known as: MEVACOR  Take 1 tablet (20 mg total) by mouth at bedtime.   mupirocin  ointment 2 % Commonly known as: BACTROBAN  Apply 1 Application topically daily.   naloxone 4 MG/0.1ML Liqd nasal spray kit Commonly known as: NARCAN SMARTSIG:Both Nares   traMADol  50 MG tablet Commonly known as: ULTRAM  Take 1 tablet (50 mg total) by mouth 2 (two) times daily as needed.   Xarelto  20 MG Tabs tablet Generic drug: rivaroxaban  Take 1 tablet (20 mg total) by mouth daily.         Contact information for follow-up providers     Edwards, Caralyn, PA-C Follow up.   Specialty: Cardiology Why: Appointment scheduled with [facility] on 7/23 at 2 PM Contact information: [address] [place] [address] [phone]              Contact information for after-discharge care     Destination     [facility] .   Service: Skilled Nursing Contact information: [address] [place]  [address] [phone]                    Discharge Exam:   Subjective: Feels much better, ready to go to rehab when a bed is available.   Objective: Vitals:   01/15/24 1413 01/15/24 1430  BP:    Pulse:  98  Resp:    Temp:    SpO2: (!) 89% 96%    Intake/Output Summary (Last 24 hours) at 01/15/2024 1457 Last data filed at 01/15/2024 1300 Gross per 24 hour  Intake 966.44 ml  Output 625 ml  Net 341.44 ml   Filed Weights   01/13/24 0500 01/14/24  9653 01/15/24 0449  Weight: 108.9 kg 109.6 kg 109.9 kg    Exam:  General:  Appears calm and comfortable and is in NAD, on RA, sitting up in bedside chair Eyes:  normal lids, iris ENT:  grossly normal hearing, lips & tongue, mmm Cardiovascular:  RRR. No LE edema.  Respiratory:   CTAB.  Normal respiratory effort. Abdomen:  soft, NT, ND Skin:  no rash or induration seen on limited exam Musculoskeletal:  grossly normal tone BUE/BLE, good ROM, no bony abnormality Psychiatric:  grossly normal mood and affect, speech fluent and appropriate, AOx3 Neurologic:  CN 2-12 grossly intact, moves all extremities in coordinated fashion  Data Reviewed: I have reviewed the patient's lab results since admission.  Pertinent labs for today include:  BUN 51/Creatinine 1.95/GFR 34, improving Normal CBC Sputum culture with rare GPC, pending Blood cultures NTD      Condition at discharge: improving  The results of significant diagnostics  from this hospitalization (including imaging, microbiology, ancillary and laboratory) are listed below for reference.   Imaging Studies: DG Chest Portable 1 View Result Date: 01/12/2024 CLINICAL DATA:  sob EXAM: PORTABLE CHEST - 1 VIEW COMPARISON:  Nov 26, 2021 FINDINGS: Bilateral perihilar interstitial opacities. Patchy opacities in the right lung base. No pleural effusion or pneumothorax. Mild cardiomegaly. Tortuous aorta with aortic atherosclerosis. No acute fracture or destructive lesions. Multilevel thoracic osteophytosis. IMPRESSION: Mild cardiomegaly with findings of either asymmetric pulmonary edema or bronchopneumonia in the right lung base. Electronically Signed   By: Rogelia Myers M.D.   On: 01/12/2024 10:56    Microbiology: Results for orders placed or performed during the hospital encounter of 01/12/24  Resp panel by RT-PCR (RSV, Flu A&B, Covid) Anterior Nasal Swab     Status: None   Collection Time: 01/12/24 10:17 AM   Specimen: Anterior Nasal Swab  Result Value Ref Range Status   SARS Coronavirus 2 by RT PCR NEGATIVE NEGATIVE Final    Comment: (NOTE) SARS-CoV-2 target nucleic acids are NOT DETECTED.  The SARS-CoV-2 RNA is generally detectable in upper respiratory specimens during the acute phase of infection. The lowest concentration of SARS-CoV-2 viral copies this assay can detect is 138 copies/mL. A negative result does not preclude SARS-Cov-2 infection and should not be used as the sole basis for treatment or other patient management decisions. A negative result may occur with  improper specimen collection/handling, submission of specimen other than nasopharyngeal swab, presence of viral mutation(s) within the areas targeted by this assay, and inadequate number of viral copies(<138 copies/mL). A negative result must be combined with clinical observations, patient history, and epidemiological information. The expected result is Negative.  Fact Sheet for Patients:   BloggerCourse.com  Fact Sheet for Healthcare Providers:  SeriousBroker.it  This test is no t yet approved or cleared by the [place]  FDA and  has been authorized for detection and/or diagnosis of SARS-CoV-2 by FDA under an Emergency Use Authorization (EUA). This EUA will remain  in effect (meaning this test can be used) for the duration of the COVID-19 declaration under Section 564(b)(1) of the Act, 21 U.S.C.section 360bbb-3(b)(1), unless the authorization is terminated  or revoked sooner.       Influenza A by PCR NEGATIVE NEGATIVE Final   Influenza B by PCR NEGATIVE NEGATIVE Final    Comment: (NOTE) The Xpert Xpress SARS-CoV-2/FLU/RSV plus assay is intended as an aid in the diagnosis of influenza from Nasopharyngeal swab specimens and should not be used as a sole basis for treatment. Nasal  washings and aspirates are unacceptable for Xpert Xpress SARS-CoV-2/FLU/RSV testing.  Fact Sheet for Patients: BloggerCourse.com  Fact Sheet for Healthcare Providers: SeriousBroker.it  This test is not yet approved or cleared by the [place]  FDA and has been authorized for detection and/or diagnosis of SARS-CoV-2 by FDA under an Emergency Use Authorization (EUA). This EUA will remain in effect (meaning this test can be used) for the duration of the COVID-19 declaration under Section 564(b)(1) of the Act, 21 U.S.C. section 360bbb-3(b)(1), unless the authorization is terminated or revoked.     Resp Syncytial Virus by PCR NEGATIVE NEGATIVE Final    Comment: (NOTE) Fact Sheet for Patients: BloggerCourse.com  Fact Sheet for Healthcare Providers: SeriousBroker.it  This test is not yet approved or cleared by the [place]  FDA and has been authorized for detection and/or diagnosis of SARS-CoV-2 by FDA under an Emergency Use  Authorization (EUA). This EUA will remain in effect (meaning this test can be used) for the duration of the COVID-19 declaration under Section 564(b)(1) of the Act, 21 U.S.C. section 360bbb-3(b)(1), unless the authorization is terminated or revoked.  Performed at [facility], [address] [place], [place] [address]   Blood Culture (routine x 2)     Status: None (Preliminary result)   Collection Time: 01/12/24 10:23 AM   Specimen: BLOOD  Result Value Ref Range Status   Specimen Description BLOOD BLOOD RIGHT HAND  Final   Special Requests   Final    BOTTLES DRAWN AEROBIC AND ANAEROBIC Blood Culture results may not be optimal due to an inadequate volume of blood received in culture bottles   Culture   Final    NO GROWTH 3 DAYS Performed at [facility] [address] [name], [place] [address]    Report Status PENDING  Incomplete  Blood Culture (routine x 2)     Status: None (Preliminary result)   Collection Time: 01/12/24 10:28 AM   Specimen: BLOOD  Result Value Ref Range Status   Specimen Description BLOOD BLOOD LEFT FOREARM  Final   Special Requests   Final    BOTTLES DRAWN AEROBIC AND ANAEROBIC Blood Culture results may not be optimal due to an inadequate volume of blood received in culture bottles   Culture   Final    NO GROWTH 3 DAYS Performed at [facility], [address], [place], [place] [address]    Report Status PENDING  Incomplete  Expectorated Sputum Assessment w Gram Stain, Rflx to Resp Cult     Status: None   Collection Time: 01/12/24  9:36 PM   Specimen: Sputum  Result Value Ref Range Status   Specimen Description SPUTUM  Final   Special Requests NONE  Final   Sputum evaluation   Final    Sputum specimen not acceptable for testing.  Please recollect.   C/ MICHELLE WATSON RN @0014  01/13/24 FOR RECOLLECT ASW Performed at [facility], [address] [place], [place] [address]    Report Status 01/13/2024 FINAL   Final  Respiratory (~20 pathogens) panel by PCR     Status: Abnormal   Collection Time: 01/13/24  3:23 AM   Specimen: Urine, Clean Catch; Respiratory  Result Value Ref Range Status   Adenovirus NOT DETECTED NOT DETECTED Final   Coronavirus 229E NOT DETECTED NOT DETECTED Final    Comment: (NOTE) The Coronavirus on the Respiratory Panel, DOES NOT test for the novel  Coronavirus (2019 nCoV)    Coronavirus HKU1 NOT DETECTED NOT DETECTED Final  Coronavirus NL63 NOT DETECTED NOT DETECTED Final   Coronavirus OC43 NOT DETECTED NOT DETECTED Final   Metapneumovirus NOT DETECTED NOT DETECTED Final   Rhinovirus / Enterovirus NOT DETECTED NOT DETECTED Final   Influenza A NOT DETECTED NOT DETECTED Final   Influenza B NOT DETECTED NOT DETECTED Final   Parainfluenza Virus 1 NOT DETECTED NOT DETECTED Final   Parainfluenza Virus 2 NOT DETECTED NOT DETECTED Final   Parainfluenza Virus 3 DETECTED (A) NOT DETECTED Final   Parainfluenza Virus 4 NOT DETECTED NOT DETECTED Final   Respiratory Syncytial Virus NOT DETECTED NOT DETECTED Final   Bordetella pertussis NOT DETECTED NOT DETECTED Final   Bordetella Parapertussis NOT DETECTED NOT DETECTED Final   Chlamydophila pneumoniae NOT DETECTED NOT DETECTED Final   Mycoplasma pneumoniae NOT DETECTED NOT DETECTED Final    Comment: Performed at [facility], [address], [place], [place] [address]  Expectorated Sputum Assessment w Gram Stain, Rflx to Resp Cult     Status: None   Collection Time: 01/13/24 11:32 AM   Specimen: SPU  Result Value Ref Range Status   Specimen Description SPUTUM  Final   Special Requests NONE  Final   Sputum evaluation   Final    THIS SPECIMEN IS ACCEPTABLE FOR SPUTUM CULTURE Performed at [facility], [address] [place], [place] [address]    Report Status 01/13/2024 FINAL  Final  Culture, Respiratory w Gram Stain     Status: None (Preliminary result)   Collection Time: 01/13/24 11:32 AM   Specimen: SPU   Result Value Ref Range Status   Specimen Description   Final    SPUTUM Performed at [facility], [address] [place], [place] [address]    Special Requests   Final    NONE Reflexed from [id number] Performed at [facility], [address] [place], [place] [address]    Gram Stain NO WBC SEEN RARE GRAM POSITIVE COCCI   Final   Culture   Final    CULTURE REINCUBATED FOR BETTER GROWTH Performed at [facility], [address] [place], [place] [address]    Report Status PENDING  Incomplete    Labs: CBC: Recent Labs  Lab 01/12/24 1010 01/13/24 0321 01/14/24 0353 01/15/24 0338  WBC 10.8* 8.3 8.6 8.5  NEUTROABS  --   --  6.4 6.3  HGB 13.4 12.6* 12.4* 13.1  HCT 42.0 39.0 38.3* 41.2  MCV 95.7 94.7 95.8 96.0  PLT 290 213 185 188   Basic Metabolic Panel: Recent Labs  Lab 01/12/24 1016 01/13/24 0321 01/14/24 0353 01/15/24 0338  NA 138 139 137 137  K 3.8 4.1 3.6 4.6  CL 98 101 99 99  CO2 27 27 27 30   GLUCOSE 139* 122* 96 108*  BUN 33* 42* 53* 51*  CREATININE 2.12* 2.21* 2.15* 1.95*  CALCIUM 9.1 9.1 8.8* 9.2   Liver Function Tests: Recent Labs  Lab 01/12/24 1016  AST 33  ALT 26  ALKPHOS 102  BILITOT 1.1  PROT 7.0  ALBUMIN 3.7   CBG: No results for input(s): GLUCAP in the last 168 hours.  Discharge time spent: greater than 30 minutes.  Signed: Delon Herald, MD [facility] 01/15/2024

## 2024-01-15 NOTE — TOC Initial Note (Addendum)
 Transition of Care (TOC) - Initial/Assessment Note    Patient Details  Name: [name] PYON Sr. MRN: [id number] Date of Birth: 08-23-1940  Transition of Care [facility]) CM/SW Contact:    Corean ONEIDA Haddock, RN Phone Number: 01/15/2024, 10:25 AM  Clinical Narrative:                  Met with patient at bedside and discussed recs for SNF.   Patient ing agreement.  Bed offers presented. CMS Medicare.gov Compare Post Acute Care list reviewed with Patient.  Patient accepts bed [facility].  Accepted in HUB, notified Darrian at [facility].   Message left on HTA line to start insurance auth.   Notified patient that insurance will likely not cover EMS transport as his ambulated 40 feet.   Per Darrian at [facility], they may potentially be able to provide dc transport.  Patient states that family would not be available to provide dc transport  1138 received return call from [place] with HTA.  Auth initiated for [facility]  Expected Discharge Plan: Skilled Nursing Facility Barriers to Discharge: Insurance Authorization   Patient Goals and CMS Choice   CMS Medicare.gov Compare Post Acute Care list provided to:: Patient Choice offered to / list presented to : Patient      Expected Discharge Plan and Services     Post Acute Care Choice: Skilled Nursing Facility                                        Prior Living Arrangements/Services   Lives with:: Self              Current home services: DME    Activities of Daily Living   ADL Screening (condition at time of admission) Independently performs ADLs?: No  Permission Sought/Granted                  Emotional Assessment       Orientation: : Oriented to Self, Oriented to Place, Oriented to  Time, Oriented to Situation      Admission diagnosis:  SOB (shortness of breath) [R06.02] Hypoxia [R09.02] Community acquired pneumonia of right lower lobe of lung [J18.9] Acute hypoxic respiratory failure (HCC)  [J96.01] Patient Active Problem List   Diagnosis Date Noted   Acute hypoxic respiratory failure (HCC) 01/12/2024   Pulmonary emphysema (HCC) 06/18/2023   Atherosclerosis of native arteries of extremity with intermittent claudication (HCC) 04/23/2023   Lymphedema 04/23/2023   Anemia due to stage 3b chronic kidney disease (HCC) 01/27/2023   Healthcare maintenance 01/27/2023   Atherosclerosis of artery of extremity with ulceration (HCC) 10/22/2022   Atherosclerosis of native arteries of the extremities with ulceration (HCC) 08/13/2022   Hypotension 07/23/2022   Chronic systolic heart failure (HCC) 07/23/2022   Atherosclerosis of native artery of both lower extremities (HCC) 03/18/2022   Ulcer of right lower extremity, limited to breakdown of skin (HCC) 02/19/2022   Permanent atrial fibrillation (HCC) 10/17/2021   Other acute postprocedural pain 10/17/2021   PND (post-nasal drip) 10/02/2021   Lumbar degenerative disc disease 08/13/2021   Failed back surgical syndrome 08/13/2021   Chronic right SI joint pain 08/13/2021   Edema, unspecified 07/25/2021   Elevated serum creatinine 06/12/2021   Stage 3b chronic kidney disease (HCC) 06/12/2021   Longstanding persistent atrial fibrillation (HCC) 06/12/2021   Colonoscopy refused 06/12/2021   CHF (congestive heart failure), NYHA class II,  unspecified failure chronicity, combined (HCC) 06/12/2021   Use of cane as ambulatory aid 06/12/2021   Chronic kidney disease (CKD), stage IV (severe) (HCC) 06/12/2021   Lumbar radiculopathy, chronic 02/13/2020   SI joint arthritis (HCC) 02/13/2020   Pre-diabetes 03/18/2017   Hypothyroidism 12/19/2014   Hypertension 12/19/2014   Dilated cardiomyopathy (HCC) 02/17/2013   S/P cardiac cath 02/17/2013   PCP:  Sharma Coyer, MD Pharmacy:   [facility] [phone] - [place], [place] - [address] AT [facility] OF [address] [place] [address] Phone: [phone] Fax:  [phone]     Social Drivers of Health (SDOH) Social History: SDOH Screenings   Food Insecurity: No Food Insecurity (01/12/2024)  Housing: Unknown (01/12/2024)  Transportation Needs: No Transportation Needs (01/12/2024)  Utilities: Not At Risk (01/13/2024)  Alcohol Screen: Low Risk  (10/06/2023)  Depression (PHQ2-9): Low Risk  (12/28/2023)  Financial Resource Strain: Low Risk  (12/28/2023)  Physical Activity: Sufficiently Active (10/06/2023)  Social Connections: Unknown (01/15/2024)  Stress: No Stress Concern Present (12/28/2023)  Tobacco Use: Medium Risk (12/29/2023)   Received from [facility]  Health Literacy: Adequate Health Literacy (10/06/2023)   SDOH Interventions:     Readmission Risk Interventions     No data to display

## 2024-01-15 NOTE — TOC Transition Note (Signed)
 Transition of Care [facility]) - Discharge Note   Patient Details  Name: [name] Sr. MRN: [id number] Date of Birth: 09-Aug-1940  Transition of Care [facility]) CM/SW Contact:  Corean ONEIDA Haddock, RN Phone Number: 01/15/2024, 3:06 PM   Clinical Narrative:     Shara approved for [name] transport went to medical review   Patient will DC to: [name] Anticipated DC date: 01/15/24  Family notified: Patient to notify son Transport by: [name] to provide transport  Per MD patient ready for DC to . RN, patient, and facility notified of DC. Discharge Summary sent to facility. RN given number for report. DC packet on chart. Ambulance transport requested for patient.  TOC signing off.    Barriers to Discharge: Insurance Authorization   Patient Goals and CMS Choice   CMS Medicare.gov Compare Post Acute Care list provided to:: Patient Choice offered to / list presented to : Patient      Discharge Placement                       Discharge Plan and Services Additional resources added to the After Visit Summary for       Post Acute Care Choice: Skilled Nursing Facility                               Social Drivers of Health (SDOH) Interventions SDOH Screenings   Food Insecurity: No Food Insecurity (01/12/2024)  Housing: Unknown (01/12/2024)  Transportation Needs: No Transportation Needs (01/12/2024)  Utilities: Not At Risk (01/13/2024)  Alcohol Screen: Low Risk  (10/06/2023)  Depression (PHQ2-9): Low Risk  (12/28/2023)  Financial Resource Strain: Low Risk  (12/28/2023)  Physical Activity: Sufficiently Active (10/06/2023)  Social Connections: Unknown (01/15/2024)  Stress: No Stress Concern Present (12/28/2023)  Tobacco Use: Medium Risk (12/29/2023)   Received from [facility]  Health Literacy: Adequate Health Literacy (10/06/2023)     Readmission Risk Interventions     No data to display

## 2024-01-15 NOTE — Progress Notes (Signed)
 Physical Therapy Treatment Patient Details Name: [name]. MRN: [id number] DOB: [birth year] Today's Date: 01/15/2024   History of Present Illness 83yo with h/o chronic HFrEF, lymphedema, PAD s/p L femoral-tibial bypass/femaorl endarterectomy, afib on Xarelto , and COPD not on home O2 who presented on 7/15 with SOB.  He was 82% on RA and started on HFNC O2.  He was found to have a RLL PNA with concomitant COPD/CHF exacerbation.    PT Comments  Pt in chair on arrival, agreeable to PT session. Pt still on room air. Pt partakes in seated exercises and then some practice with AMB. Pt requires supervision to attend to sets/reps/hold times. Pt still requires minA to come to standing, but is safe from a balance and 4WW use standpoint. O2 stable until AMB of ~130ft with DOE and drop to 89%. BP has been low today, but improved with upright and exercise. Will continue to follow.     If plan is discharge home, recommend the following: A little help with walking and/or transfers;A little help with bathing/dressing/bathroom   Can travel by private vehicle     Yes  Equipment Recommendations  Rolling walker (2 wheels)    Recommendations for Other Services       Precautions / Restrictions Precautions Precautions: Fall     Mobility  Bed Mobility                    Transfers Overall transfer level: Needs assistance Equipment used: Rollator (4 wheels) Transfers: Sit to/from Stand Sit to Stand: Min assist           General transfer comment: supervision level when 2 pillows are place between chair and buttocks    Ambulation/Gait Ambulation/Gait assistance: Contact guard assist Gait Distance (Feet): 100 Feet Assistive device: Rollator (4 wheels) Gait Pattern/deviations: Step-through pattern       General Gait Details: safe and steady to door and back twice. safe use of 4WW. farther than yesterday. ending SpO2 on room air 89%   Stairs             Wheelchair  Mobility     Tilt Bed    Modified Rankin (Stroke Patients Only)       Balance                                            Communication    Cognition                                        Cueing    Exercises Other Exercises Other Exercises: STS from chair x5, minA Other Exercises: LAQ 1x10x3secH (bilat) Other Exercises: seated high knee marching x10 bilat    General Comments        Pertinent Vitals/Pain Pain Assessment Pain Assessment: No/denies pain    Home Living                          Prior Function            PT Goals (current goals can now be found in the care plan section) Acute Rehab PT Goals Patient Stated Goal: to go to SNF PT Goal Formulation: With patient Time For Goal Achievement: 01/28/24 Potential to Achieve Goals: Good Progress  towards PT goals: Progressing toward goals    Frequency    Min 2X/week      PT Plan      Co-evaluation              AM-PAC PT 6 Clicks Mobility   Outcome Measure  Help needed turning from your back to your side while in a flat bed without using bedrails?: A Lot Help needed moving from lying on your back to sitting on the side of a flat bed without using bedrails?: A Lot Help needed moving to and from a bed to a chair (including a wheelchair)?: A Lot Help needed standing up from a chair using your arms (e.g., wheelchair or bedside chair)?: A Lot Help needed to walk in hospital room?: A Lot Help needed climbing 3-5 steps with a railing? : A Lot 6 Click Score: 12    End of Session   Activity Tolerance: Patient tolerated treatment well;No increased pain Patient left: in chair;with chair alarm set;with call bell/phone within reach Nurse Communication: Mobility status PT Visit Diagnosis: Muscle weakness (generalized) (M62.81);Unsteadiness on feet (R26.81)     Time: 1335-1410 PT Time Calculation (min) (ACUTE ONLY): 35 min  Charges:    $Therapeutic  Activity: 23-37 mins PT General Charges $$ ACUTE PT VISIT: 1 Visit                    2:26 PM, 01/15/24 Peggye JAYSON Linear, PT, DPT Physical Therapist - [facility]  [phone] (ASCOM)    Yahira Timberman C 01/15/2024, 2:22 PM

## 2024-01-15 NOTE — NC FL2 (Signed)
 [facility]  MEDICAID FL2 LEVEL OF CARE FORM     IDENTIFICATION  Patient Name: [name] SPANO Sr. Birthdate: 07/18/1940 Sex: male Admission Date (Current Location): 01/12/2024  [facility] [place] Number:      Facility and Address:         Provider Number: [id number]  Attending Physician Name and Address:  Barbarann Nest, MD  Relative Name and Phone Number:       Current Level of Care: Hospital Recommended Level of Care: Skilled Nursing Facility Prior Approval Number:    Date Approved/Denied:   PASRR Number: [id number]  Discharge Plan: SNF    Current Diagnoses: Patient Active Problem List   Diagnosis Date Noted   Acute hypoxic respiratory failure (HCC) 01/12/2024   Pulmonary emphysema (HCC) 06/18/2023   Atherosclerosis of native arteries of extremity with intermittent claudication (HCC) 04/23/2023   Lymphedema 04/23/2023   Anemia due to stage 3b chronic kidney disease (HCC) 01/27/2023   Healthcare maintenance 01/27/2023   Atherosclerosis of artery of extremity with ulceration (HCC) 10/22/2022   Atherosclerosis of native arteries of the extremities with ulceration (HCC) 08/13/2022   Hypotension 07/23/2022   Chronic systolic heart failure (HCC) 07/23/2022   Atherosclerosis of native artery of both lower extremities (HCC) 03/18/2022   Ulcer of right lower extremity, limited to breakdown of skin (HCC) 02/19/2022   Permanent atrial fibrillation (HCC) 10/17/2021   Other acute postprocedural pain 10/17/2021   PND (post-nasal drip) 10/02/2021   Lumbar degenerative disc disease 08/13/2021   Failed back surgical syndrome 08/13/2021   Chronic right SI joint pain 08/13/2021   Edema, unspecified 07/25/2021   Elevated serum creatinine 06/12/2021   Stage 3b chronic kidney disease (HCC) 06/12/2021   Longstanding persistent atrial fibrillation (HCC) 06/12/2021   Colonoscopy refused 06/12/2021   CHF (congestive heart failure), NYHA class II, unspecified failure chronicity,  combined (HCC) 06/12/2021   Use of cane as ambulatory aid 06/12/2021   Chronic kidney disease (CKD), stage IV (severe) (HCC) 06/12/2021   Lumbar radiculopathy, chronic 02/13/2020   SI joint arthritis (HCC) 02/13/2020   Pre-diabetes 03/18/2017   Hypothyroidism 12/19/2014   Hypertension 12/19/2014   Dilated cardiomyopathy (HCC) 02/17/2013   S/P cardiac cath 02/17/2013    Orientation RESPIRATION BLADDER Height & Weight     Self, Place, Situation, Time  Normal Continent Weight: 109.9 kg Height:     BEHAVIORAL SYMPTOMS/MOOD NEUROLOGICAL BOWEL NUTRITION STATUS      Continent Diet (Cardiac)  AMBULATORY STATUS COMMUNICATION OF NEEDS Skin   Limited Assist Verbally Normal                       Personal Care Assistance Level of Assistance              Functional Limitations Info             SPECIAL CARE FACTORS FREQUENCY  PT (By licensed PT), OT (By licensed OT)                    Contractures Contractures Info: Not present    Additional Factors Info  Code Status, Allergies Code Status Info: DNR Allergies Info: NKDA           Current Medications (01/15/2024):  This is the current hospital active medication list Current Facility-Administered Medications  Medication Dose Route Frequency Provider Last Rate Last Admin   acetaminophen  (TYLENOL ) tablet 650 mg  650 mg Oral Q6H PRN Krugh, Marissa C, DO       Or  acetaminophen  (TYLENOL ) suppository 650 mg  650 mg Rectal Q6H PRN Krugh, Marissa C, DO       amiodarone  (PACERONE ) tablet 200 mg  200 mg Oral Daily Krugh, Marissa C, DO   200 mg at 01/14/24 9187   azithromycin  (ZITHROMAX ) tablet 500 mg  500 mg Oral Daily Barbarann Nest, MD   500 mg at 01/14/24 9187   cefTRIAXone  (ROCEPHIN ) 2 g in sodium chloride  0.9 % 100 mL IVPB  2 g Intravenous Q24H Krugh, Marissa C, DO   Stopped at 01/14/24 1140   clopidogrel  (PLAVIX ) tablet 75 mg  75 mg Oral Daily Krugh, Marissa C, DO   75 mg at 01/14/24 9187   fluticasone  (FLONASE )  50 MCG/ACT nasal spray 2 spray  2 spray Each Nare Daily Krugh, Marissa C, DO   2 spray at 01/14/24 9188   gabapentin  (NEURONTIN ) capsule 300 mg  300 mg Oral BID Krugh, Marissa C, DO   300 mg at 01/14/24 2137   guaiFENesin  (MUCINEX ) 12 hr tablet 600 mg  600 mg Oral BID Krugh, Marissa C, DO   600 mg at 01/14/24 2137   ipratropium-albuterol  (DUONEB) 0.5-2.5 (3) MG/3ML nebulizer solution 3 mL  3 mL Nebulization Q4H PRN Krugh, Marissa C, DO       levothyroxine  (SYNTHROID ) tablet 100 mcg  100 mcg Oral Q0600 Krugh, Marissa C, DO   100 mcg at 01/15/24 0604   mupirocin  ointment (BACTROBAN ) 2 % 1 Application  1 Application Topical Daily Krugh, Marissa C, DO   1 Application at 01/14/24 9187   pravastatin  (PRAVACHOL ) tablet 20 mg  20 mg Oral q1800 Krugh, Marissa C, DO   20 mg at 01/14/24 1707   Rivaroxaban  (XARELTO ) tablet 15 mg  15 mg Oral Daily Nazari, Walid A, RPH   15 mg at 01/14/24 0815   traMADol  (ULTRAM ) tablet 50 mg  50 mg Oral BID PRN Krugh, Marissa C, DO   50 mg at 01/14/24 2137     Discharge Medications: Please see discharge summary for a list of discharge medications.  Relevant Imaging Results:  Relevant Lab Results:   Additional Information ss# [id number]  Corean ONEIDA Haddock, RN

## 2024-01-15 NOTE — Progress Notes (Signed)
 Report given to [facility] nurse for room 1206.

## 2024-01-15 NOTE — Consult Note (Signed)
 Consultation Note Date: 01/15/2024   Patient Name: [name]  DOB: 04/08/1941  MRN: [id number]  Age / Sex: 83 y.o., male  PCP: Sharma Coyer, MD Referring Physician: Barbarann Nest, MD  Reason for Consultation: Establishing goals of care   HPI/Brief Hospital Course: 83 y.o. male  with past medical history of HFrEF with associated lymphedema and chronic dyspnea on exertion, PAD's s/p left femoral-tibial bypass, A. Fib Xarelto , and COPD. Admitted from home on 01/12/2024 with shortness of breath.   Admitted and being treated for RLLL PNA along with CHF/COPD exacerbation Found to be positive for parainfluenza virus  Palliative medicine was consulted for assisting with goals of care conversations  Subjective:  Extensive chart review has been completed prior to meeting patient including labs, vital signs, imaging, progress notes, orders, and available advanced directive documents from current and previous encounters.  Visited with [name] on his bedside.  He is awake, alert, sitting up in recliner and able to engage in conversation.  No family or visitors at bedside during time of visit.  Introduced myself as a Publishing rights manager as a member of the palliative care team. Explained palliative medicine is specialized medical care for people living with serious illness. It focuses on providing relief from the symptoms and stress of a serious illness. The goal is to improve quality of life for both the patient and the family.   [name] provides a brief life review.  He is currently widowed lost his wife several years ago after being married to her for 50 years.  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  Prior to admission he function independently, still driving and able to perform all ADLs without assistance.  He  enjoys spending time outdoors and fishing when time allows.  We discussed patient's current illness and what it means in the larger context of patient's on-going co-morbidities. Natural disease trajectory and expectations at EOL were discussed.   Assessed symptoms.  [name] denies acute pain or discomfort, reports improvement in his breathing and overall feeling much better compared to day of admission.  Reports no recent changes in his appetite or weight loss and reports sleeping well at night.  Attempted to elicit goals of care.  [name] confirms DNR/DNI status.  He shares his goal is to discharge to short-term rehab with hopes to improve strengthening and conditioning in order to be able to return home with his son and function independently.  Discussed hospice as this has been mentioned throughout his record during this admission, [name] clarifies he discussed hospice as an interest he had at the end of his life.  [name] shares he does not feel he is at that point but wanted to be clear he would be accepting of hospice services when his insurance is nearing.  [name] clear at this time he wishes to continue with current plan of care, discharging to short-term rehab with a goal to return home living as he was prior to admission.  I discussed importance of continued conversations with family/support persons and all members of their medical team regarding overall plan of care and treatment options ensuring decisions are in alignment with patients goals of care.  All questions/concerns addressed. Emotional support provided to patient/family/support persons. PMT will continue to follow and support patient as needed.  Objective: Primary Diagnoses: Present on Admission:  Atherosclerosis of  native artery of both lower extremities (HCC)  Chronic kidney disease (CKD), stage IV (severe) (HCC)  Pulmonary emphysema (HCC)  Longstanding persistent atrial fibrillation (HCC)  Chronic  systolic heart failure (HCC)  Permanent atrial fibrillation (HCC)  Acute hypoxic respiratory failure (HCC)   Physical Exam Constitutional:      General: He is not in acute distress.    Appearance: He is ill-appearing.  Pulmonary:     Effort: Pulmonary effort is normal. No respiratory distress.  Musculoskeletal:     Right lower leg: 2+ Pitting Edema present.     Left lower leg: 2+ Pitting Edema present.  Skin:    General: Skin is warm and dry.  Neurological:     Mental Status: He is alert and oriented to person, place, and time.     Motor: Weakness present.  Psychiatric:        Mood and Affect: Mood normal.        Behavior: Behavior normal.        Thought Content: Thought content normal.     Vital Signs: BP 93/64   Pulse 98   Temp 98 F (36.7 C)   Resp 20   Wt 109.9 kg   SpO2 96%   BMI 31.11 kg/m  Pain Scale: 0-10 POSS *See Group Information*: 1-Acceptable,Awake and alert Pain Score: 0-No pain  IO: Intake/output summary:  Intake/Output Summary (Last 24 hours) at 01/15/2024 1520 Last data filed at 01/15/2024 1300 Gross per 24 hour  Intake 966.44 ml  Output 625 ml  Net 341.44 ml    LBM: Last BM Date : 01/14/24 Baseline Weight: Weight: 113 kg Most recent weight: Weight: 109.9 kg      Assessment and Plan  SUMMARY OF RECOMMENDATIONS   Continue with current plan of care  Discussed With: Primary team   Thank you for this consult and allowing Palliative Medicine to participate in the care of [name] July Sr. Palliative medicine will continue to follow and assist as needed.   Time Total: 75 minutes  Time spent includes: Detailed review of medical records (labs, imaging, vital signs), medically appropriate exam (mental status, respiratory, cardiac, skin), discussed with treatment team, counseling and educating patient, family and staff, documenting clinical information, medication management and coordination of care.   Signed by: Waddell Lesches, DNP,  AGNP-C Palliative Medicine    Please contact Palliative Medicine Team phone at [phone] for questions and concerns.  For individual provider: See Tr[place]

## 2024-01-16 LAB — CULTURE, RESPIRATORY W GRAM STAIN
Culture: NORMAL
Gram Stain: NONE SEEN

## 2024-01-17 DIAGNOSIS — R2689 Other abnormalities of gait and mobility: Secondary | ICD-10-CM | POA: Diagnosis not present

## 2024-01-17 DIAGNOSIS — I480 Paroxysmal atrial fibrillation: Secondary | ICD-10-CM | POA: Diagnosis not present

## 2024-01-17 DIAGNOSIS — M6281 Muscle weakness (generalized): Secondary | ICD-10-CM | POA: Diagnosis not present

## 2024-01-17 DIAGNOSIS — J9601 Acute respiratory failure with hypoxia: Secondary | ICD-10-CM | POA: Diagnosis not present

## 2024-01-17 LAB — CULTURE, BLOOD (ROUTINE X 2)
Culture: NO GROWTH
Culture: NO GROWTH

## 2024-01-18 DIAGNOSIS — J122 Parainfluenza virus pneumonia: Secondary | ICD-10-CM | POA: Diagnosis not present

## 2024-01-18 DIAGNOSIS — I4891 Unspecified atrial fibrillation: Secondary | ICD-10-CM | POA: Diagnosis not present

## 2024-01-18 DIAGNOSIS — N184 Chronic kidney disease, stage 4 (severe): Secondary | ICD-10-CM | POA: Diagnosis not present

## 2024-01-18 DIAGNOSIS — Z951 Presence of aortocoronary bypass graft: Secondary | ICD-10-CM | POA: Diagnosis not present

## 2024-01-18 DIAGNOSIS — I70213 Atherosclerosis of native arteries of extremities with intermittent claudication, bilateral legs: Secondary | ICD-10-CM | POA: Diagnosis not present

## 2024-01-18 DIAGNOSIS — J449 Chronic obstructive pulmonary disease, unspecified: Secondary | ICD-10-CM | POA: Diagnosis not present

## 2024-01-18 DIAGNOSIS — J9601 Acute respiratory failure with hypoxia: Secondary | ICD-10-CM | POA: Diagnosis not present

## 2024-01-18 DIAGNOSIS — I5022 Chronic systolic (congestive) heart failure: Secondary | ICD-10-CM | POA: Diagnosis not present

## 2024-01-19 DIAGNOSIS — E039 Hypothyroidism, unspecified: Secondary | ICD-10-CM | POA: Diagnosis not present

## 2024-01-19 DIAGNOSIS — N184 Chronic kidney disease, stage 4 (severe): Secondary | ICD-10-CM | POA: Diagnosis not present

## 2024-01-19 DIAGNOSIS — L988 Other specified disorders of the skin and subcutaneous tissue: Secondary | ICD-10-CM | POA: Diagnosis not present

## 2024-01-19 DIAGNOSIS — I739 Peripheral vascular disease, unspecified: Secondary | ICD-10-CM | POA: Diagnosis not present

## 2024-01-19 DIAGNOSIS — J441 Chronic obstructive pulmonary disease with (acute) exacerbation: Secondary | ICD-10-CM | POA: Diagnosis not present

## 2024-01-19 DIAGNOSIS — I4821 Permanent atrial fibrillation: Secondary | ICD-10-CM | POA: Diagnosis not present

## 2024-01-19 DIAGNOSIS — I890 Lymphedema, not elsewhere classified: Secondary | ICD-10-CM | POA: Diagnosis not present

## 2024-01-19 DIAGNOSIS — I130 Hypertensive heart and chronic kidney disease with heart failure and stage 1 through stage 4 chronic kidney disease, or unspecified chronic kidney disease: Secondary | ICD-10-CM | POA: Diagnosis not present

## 2024-01-19 DIAGNOSIS — I2510 Atherosclerotic heart disease of native coronary artery without angina pectoris: Secondary | ICD-10-CM | POA: Diagnosis not present

## 2024-01-19 DIAGNOSIS — I5022 Chronic systolic (congestive) heart failure: Secondary | ICD-10-CM | POA: Diagnosis not present

## 2024-01-19 DIAGNOSIS — J189 Pneumonia, unspecified organism: Secondary | ICD-10-CM | POA: Diagnosis not present

## 2024-01-19 DIAGNOSIS — E785 Hyperlipidemia, unspecified: Secondary | ICD-10-CM | POA: Diagnosis not present

## 2024-01-20 DIAGNOSIS — I4811 Longstanding persistent atrial fibrillation: Secondary | ICD-10-CM | POA: Diagnosis not present

## 2024-01-20 DIAGNOSIS — I420 Dilated cardiomyopathy: Secondary | ICD-10-CM | POA: Diagnosis not present

## 2024-01-20 DIAGNOSIS — I4891 Unspecified atrial fibrillation: Secondary | ICD-10-CM | POA: Diagnosis not present

## 2024-01-20 DIAGNOSIS — I5022 Chronic systolic (congestive) heart failure: Secondary | ICD-10-CM | POA: Diagnosis not present

## 2024-01-20 DIAGNOSIS — I10 Essential (primary) hypertension: Secondary | ICD-10-CM | POA: Diagnosis not present

## 2024-01-20 DIAGNOSIS — I70213 Atherosclerosis of native arteries of extremities with intermittent claudication, bilateral legs: Secondary | ICD-10-CM | POA: Diagnosis not present

## 2024-01-20 DIAGNOSIS — I480 Paroxysmal atrial fibrillation: Secondary | ICD-10-CM | POA: Diagnosis not present

## 2024-01-20 DIAGNOSIS — Z951 Presence of aortocoronary bypass graft: Secondary | ICD-10-CM | POA: Diagnosis not present

## 2024-01-20 DIAGNOSIS — N184 Chronic kidney disease, stage 4 (severe): Secondary | ICD-10-CM | POA: Diagnosis not present

## 2024-01-20 DIAGNOSIS — J449 Chronic obstructive pulmonary disease, unspecified: Secondary | ICD-10-CM | POA: Diagnosis not present

## 2024-01-20 DIAGNOSIS — J122 Parainfluenza virus pneumonia: Secondary | ICD-10-CM | POA: Diagnosis not present

## 2024-01-20 DIAGNOSIS — J9601 Acute respiratory failure with hypoxia: Secondary | ICD-10-CM | POA: Diagnosis not present

## 2024-01-22 DIAGNOSIS — E785 Hyperlipidemia, unspecified: Secondary | ICD-10-CM | POA: Diagnosis not present

## 2024-01-22 DIAGNOSIS — I739 Peripheral vascular disease, unspecified: Secondary | ICD-10-CM | POA: Diagnosis not present

## 2024-01-22 DIAGNOSIS — J441 Chronic obstructive pulmonary disease with (acute) exacerbation: Secondary | ICD-10-CM | POA: Diagnosis not present

## 2024-01-22 DIAGNOSIS — I130 Hypertensive heart and chronic kidney disease with heart failure and stage 1 through stage 4 chronic kidney disease, or unspecified chronic kidney disease: Secondary | ICD-10-CM | POA: Diagnosis not present

## 2024-01-22 DIAGNOSIS — J189 Pneumonia, unspecified organism: Secondary | ICD-10-CM | POA: Diagnosis not present

## 2024-01-22 DIAGNOSIS — I890 Lymphedema, not elsewhere classified: Secondary | ICD-10-CM | POA: Diagnosis not present

## 2024-01-22 DIAGNOSIS — N184 Chronic kidney disease, stage 4 (severe): Secondary | ICD-10-CM | POA: Diagnosis not present

## 2024-01-22 DIAGNOSIS — I5022 Chronic systolic (congestive) heart failure: Secondary | ICD-10-CM | POA: Diagnosis not present

## 2024-01-22 DIAGNOSIS — I2510 Atherosclerotic heart disease of native coronary artery without angina pectoris: Secondary | ICD-10-CM | POA: Diagnosis not present

## 2024-01-22 DIAGNOSIS — E039 Hypothyroidism, unspecified: Secondary | ICD-10-CM | POA: Diagnosis not present

## 2024-01-22 DIAGNOSIS — I4821 Permanent atrial fibrillation: Secondary | ICD-10-CM | POA: Diagnosis not present

## 2024-01-25 DIAGNOSIS — I70203 Unspecified atherosclerosis of native arteries of extremities, bilateral legs: Secondary | ICD-10-CM | POA: Diagnosis not present

## 2024-01-25 DIAGNOSIS — N184 Chronic kidney disease, stage 4 (severe): Secondary | ICD-10-CM | POA: Diagnosis not present

## 2024-01-25 DIAGNOSIS — I890 Lymphedema, not elsewhere classified: Secondary | ICD-10-CM | POA: Diagnosis not present

## 2024-01-25 DIAGNOSIS — J9601 Acute respiratory failure with hypoxia: Secondary | ICD-10-CM | POA: Diagnosis not present

## 2024-01-25 DIAGNOSIS — I5022 Chronic systolic (congestive) heart failure: Secondary | ICD-10-CM | POA: Diagnosis not present

## 2024-01-25 DIAGNOSIS — I4891 Unspecified atrial fibrillation: Secondary | ICD-10-CM | POA: Diagnosis not present

## 2024-01-26 DIAGNOSIS — L988 Other specified disorders of the skin and subcutaneous tissue: Secondary | ICD-10-CM | POA: Diagnosis not present

## 2024-01-27 DIAGNOSIS — I5022 Chronic systolic (congestive) heart failure: Secondary | ICD-10-CM | POA: Diagnosis not present

## 2024-01-27 DIAGNOSIS — I890 Lymphedema, not elsewhere classified: Secondary | ICD-10-CM | POA: Diagnosis not present

## 2024-01-27 DIAGNOSIS — J449 Chronic obstructive pulmonary disease, unspecified: Secondary | ICD-10-CM | POA: Diagnosis not present

## 2024-01-27 DIAGNOSIS — L03119 Cellulitis of unspecified part of limb: Secondary | ICD-10-CM | POA: Diagnosis not present

## 2024-01-27 DIAGNOSIS — N184 Chronic kidney disease, stage 4 (severe): Secondary | ICD-10-CM | POA: Diagnosis not present

## 2024-01-27 DIAGNOSIS — J189 Pneumonia, unspecified organism: Secondary | ICD-10-CM | POA: Diagnosis not present

## 2024-01-27 DIAGNOSIS — I4891 Unspecified atrial fibrillation: Secondary | ICD-10-CM | POA: Diagnosis not present

## 2024-01-27 DIAGNOSIS — J9601 Acute respiratory failure with hypoxia: Secondary | ICD-10-CM | POA: Diagnosis not present

## 2024-01-29 DIAGNOSIS — M5451 Vertebrogenic low back pain: Secondary | ICD-10-CM | POA: Diagnosis not present

## 2024-01-29 DIAGNOSIS — J9601 Acute respiratory failure with hypoxia: Secondary | ICD-10-CM | POA: Diagnosis not present

## 2024-01-29 DIAGNOSIS — J189 Pneumonia, unspecified organism: Secondary | ICD-10-CM | POA: Diagnosis not present

## 2024-01-29 DIAGNOSIS — L03119 Cellulitis of unspecified part of limb: Secondary | ICD-10-CM | POA: Diagnosis not present

## 2024-01-29 DIAGNOSIS — I890 Lymphedema, not elsewhere classified: Secondary | ICD-10-CM | POA: Diagnosis not present

## 2024-01-29 DIAGNOSIS — K5901 Slow transit constipation: Secondary | ICD-10-CM | POA: Diagnosis not present

## 2024-01-29 DIAGNOSIS — I5022 Chronic systolic (congestive) heart failure: Secondary | ICD-10-CM | POA: Diagnosis not present

## 2024-02-01 DIAGNOSIS — L988 Other specified disorders of the skin and subcutaneous tissue: Secondary | ICD-10-CM | POA: Diagnosis not present

## 2024-02-05 ENCOUNTER — Telehealth: Payer: Self-pay

## 2024-02-05 ENCOUNTER — Other Ambulatory Visit: Payer: Self-pay | Admitting: Family Medicine

## 2024-02-05 DIAGNOSIS — E039 Hypothyroidism, unspecified: Secondary | ICD-10-CM

## 2024-02-05 NOTE — Transitions of Care (Post Inpatient/ED Visit) (Signed)
 02/05/2024  Name: [name] BONES Sr. MRN: [id number] DOB: [birth year]  Today's TOC FU Call Status: Today's TOC FU Call Status:: Successful TOC FU Call Completed TOC FU Call Complete Date: 02/05/24 Patient's Name and Date of Birth confirmed.  Transition Care Management Follow-up Telephone Call Date of Discharge: 02/04/24 Discharge Facility: Other ([facility]) Name of Other ([facility]) Discharge Facility: [name] Type of Discharge: Inpatient Admission Primary Inpatient Discharge Diagnosis:: HD How have you been since you were released from the hospital?: Better Any questions or concerns?: No  Items Reviewed: Did you receive and understand the discharge instructions provided?: Yes Medications obtained,verified, and reconciled?: Yes (Medications Reviewed) Any new allergies since your discharge?: No Dietary orders reviewed?: Yes Do you have support at home?: Yes People in Home [RPT]: child(ren), adult  Medications Reviewed Today: Medications Reviewed Today     Reviewed by Emmitt Pan, LPN (Licensed Practical Nurse) on 02/05/24 at [phone]  Med List Status: <None>   Medication Order Taking? Sig Documenting Provider Last Dose Status Informant  amiodarone  (PACERONE ) 200 MG tablet [id number] Yes Take 1 tablet (200 mg total) by mouth daily. [name], Rockie, MD  Active Self, Pharmacy Records  clopidogrel  (PLAVIX ) 75 MG tablet [id number] Yes Take 1 tablet (75 mg total) by mouth daily. Elisabeth Gwendlyn SAUNDERS, NP  Active Self, Pharmacy Records  FEROSUL 325 [id number] Fe) MG tablet [id number] Yes TAKE 1 TABLET BY MOUTH DAILY WITH BREAKFAST [name], [name], MD  Active Self, Pharmacy Records  fluticasone  (FLONASE ) 50 MCG/ACT nasal spray [id number] Yes Place 2 sprays into both nostrils daily.  Patient taking differently: Place 2 sprays into both nostrils daily as needed.   Emilio Kelly DASEN, FNP  Active Self, Pharmacy Records  gabapentin  (NEURONTIN ) 300 MG capsule [id number] Yes Take 1  capsule (300 mg total) by mouth 2 (two) times daily. Barbarann Nest, MD  Active   levothyroxine  (SYNTHROID ) 100 MCG tablet [id number] Yes TAKE 1 TABLET BY MOUTH DAILY BEFORE BREAKFAST [name], [name], MD  Active Self, Pharmacy Records  lisinopril  (ZESTRIL ) 2.5 MG tablet [id number]  Take 1 tablet (2.5 mg total) by mouth daily.  Patient not taking: Reported on 02/05/2024   Sharma Rockie, MD  Active Self, Pharmacy Records  lovastatin  (MEVACOR ) 20 MG tablet [id number] Yes Take 1 tablet (20 mg total) by mouth at bedtime. [name], Rockie, MD  Active Self, Pharmacy Records  mupirocin  ointment (BACTROBAN ) 2 % [id number] Yes Apply 1 Application topically daily. Brown, [name], NP  Active Self, Pharmacy Records  naloxone [facility]) nasal spray 4 mg/0.1 mL [id number] Yes SMARTSIG:Both Nares [provider]  Active Self, Pharmacy Records  torsemide  (DEMADEX ) 20 MG tablet [id number]  Take 1 tablet (20 mg total) by mouth 2 (two) times daily.  Patient not taking: Reported on 02/05/2024   Sharma Rockie, MD  Active Self, Pharmacy Records  traMADol  (ULTRAM ) 50 MG tablet [id number] Yes Take 1 tablet (50 mg total) by mouth 2 (two) times daily as needed. Barbarann Nest, MD  Active   XARELTO  20 MG TABS tablet [id number] Yes Take 1 tablet (20 mg total) by mouth daily. Sharma Rockie, MD  Active Self, Pharmacy Records  Med List Note Wendelyn Devere PEAK 05/04/18 9081): UDS 05/04/2018            Home Care and Equipment/Supplies: Were Home Health Services Ordered?: NA Any new equipment or medical supplies ordered?: NA  Functional Questionnaire: Do you need assistance with bathing/showering or dressing?: No Do you need assistance with meal preparation?: No Do you need assistance with  eating?: No Do you have difficulty maintaining continence: No Do you need assistance with getting out of bed/getting out of a chair/moving?: No Do you have difficulty managing or  taking your medications?: No  Follow up appointments reviewed: PCP Follow-up appointment confirmed?: Yes Date of PCP follow-up appointment?: 02/09/24 Follow-up Provider: Simmons[facility] Follow-up appointment confirmed?: NA Do you need transportation to your follow-up appointment?: No Do you understand care options if your condition(s) worsen?: Yes-patient verbalized understanding    SIGNATURE Julian Lemmings, LPN [facility] Nurse Health Advisor Direct Dial [phone]

## 2024-02-09 ENCOUNTER — Inpatient Hospital Stay: Admitting: Family Medicine

## 2024-02-12 ENCOUNTER — Emergency Department

## 2024-02-12 ENCOUNTER — Inpatient Hospital Stay
Admission: EM | Admit: 2024-02-12 | Discharge: 2024-02-29 | DRG: 951 | Disposition: E | Attending: Internal Medicine | Admitting: Internal Medicine

## 2024-02-12 ENCOUNTER — Other Ambulatory Visit: Payer: Self-pay

## 2024-02-12 DIAGNOSIS — R6521 Severe sepsis with septic shock: Secondary | ICD-10-CM | POA: Diagnosis not present

## 2024-02-12 DIAGNOSIS — I110 Hypertensive heart disease with heart failure: Secondary | ICD-10-CM | POA: Diagnosis not present

## 2024-02-12 DIAGNOSIS — I872 Venous insufficiency (chronic) (peripheral): Secondary | ICD-10-CM | POA: Diagnosis present

## 2024-02-12 DIAGNOSIS — Z66 Do not resuscitate: Secondary | ICD-10-CM | POA: Diagnosis not present

## 2024-02-12 DIAGNOSIS — I420 Dilated cardiomyopathy: Secondary | ICD-10-CM | POA: Diagnosis present

## 2024-02-12 DIAGNOSIS — Z8249 Family history of ischemic heart disease and other diseases of the circulatory system: Secondary | ICD-10-CM

## 2024-02-12 DIAGNOSIS — D696 Thrombocytopenia, unspecified: Secondary | ICD-10-CM | POA: Diagnosis not present

## 2024-02-12 DIAGNOSIS — R7303 Prediabetes: Secondary | ICD-10-CM | POA: Diagnosis present

## 2024-02-12 DIAGNOSIS — I480 Paroxysmal atrial fibrillation: Secondary | ICD-10-CM | POA: Diagnosis present

## 2024-02-12 DIAGNOSIS — E872 Acidosis, unspecified: Secondary | ICD-10-CM | POA: Diagnosis present

## 2024-02-12 DIAGNOSIS — Z79890 Hormone replacement therapy (postmenopausal): Secondary | ICD-10-CM

## 2024-02-12 DIAGNOSIS — Z87891 Personal history of nicotine dependence: Secondary | ICD-10-CM

## 2024-02-12 DIAGNOSIS — I130 Hypertensive heart and chronic kidney disease with heart failure and stage 1 through stage 4 chronic kidney disease, or unspecified chronic kidney disease: Secondary | ICD-10-CM | POA: Diagnosis not present

## 2024-02-12 DIAGNOSIS — G8929 Other chronic pain: Secondary | ICD-10-CM | POA: Diagnosis present

## 2024-02-12 DIAGNOSIS — I739 Peripheral vascular disease, unspecified: Secondary | ICD-10-CM | POA: Diagnosis present

## 2024-02-12 DIAGNOSIS — I2510 Atherosclerotic heart disease of native coronary artery without angina pectoris: Secondary | ICD-10-CM | POA: Diagnosis not present

## 2024-02-12 DIAGNOSIS — N289 Disorder of kidney and ureter, unspecified: Secondary | ICD-10-CM | POA: Diagnosis present

## 2024-02-12 DIAGNOSIS — L03116 Cellulitis of left lower limb: Secondary | ICD-10-CM | POA: Diagnosis not present

## 2024-02-12 DIAGNOSIS — Z7901 Long term (current) use of anticoagulants: Secondary | ICD-10-CM | POA: Diagnosis not present

## 2024-02-12 DIAGNOSIS — E785 Hyperlipidemia, unspecified: Secondary | ICD-10-CM | POA: Diagnosis present

## 2024-02-12 DIAGNOSIS — N179 Acute kidney failure, unspecified: Secondary | ICD-10-CM | POA: Diagnosis present

## 2024-02-12 DIAGNOSIS — G9341 Metabolic encephalopathy: Secondary | ICD-10-CM | POA: Diagnosis not present

## 2024-02-12 DIAGNOSIS — J811 Chronic pulmonary edema: Secondary | ICD-10-CM | POA: Diagnosis not present

## 2024-02-12 DIAGNOSIS — Z820 Family history of epilepsy and other diseases of the nervous system: Secondary | ICD-10-CM

## 2024-02-12 DIAGNOSIS — I5023 Acute on chronic systolic (congestive) heart failure: Secondary | ICD-10-CM | POA: Diagnosis not present

## 2024-02-12 DIAGNOSIS — E039 Hypothyroidism, unspecified: Secondary | ICD-10-CM | POA: Diagnosis not present

## 2024-02-12 DIAGNOSIS — K219 Gastro-esophageal reflux disease without esophagitis: Secondary | ICD-10-CM | POA: Diagnosis present

## 2024-02-12 DIAGNOSIS — N184 Chronic kidney disease, stage 4 (severe): Secondary | ICD-10-CM | POA: Diagnosis present

## 2024-02-12 DIAGNOSIS — R6520 Severe sepsis without septic shock: Secondary | ICD-10-CM | POA: Diagnosis not present

## 2024-02-12 DIAGNOSIS — J90 Pleural effusion, not elsewhere classified: Secondary | ICD-10-CM | POA: Diagnosis not present

## 2024-02-12 DIAGNOSIS — I517 Cardiomegaly: Secondary | ICD-10-CM | POA: Diagnosis not present

## 2024-02-12 DIAGNOSIS — H8110 Benign paroxysmal vertigo, unspecified ear: Secondary | ICD-10-CM | POA: Diagnosis present

## 2024-02-12 DIAGNOSIS — J9621 Acute and chronic respiratory failure with hypoxia: Secondary | ICD-10-CM | POA: Diagnosis present

## 2024-02-12 DIAGNOSIS — L03115 Cellulitis of right lower limb: Secondary | ICD-10-CM | POA: Diagnosis present

## 2024-02-12 DIAGNOSIS — Z79899 Other long term (current) drug therapy: Secondary | ICD-10-CM

## 2024-02-12 DIAGNOSIS — M810 Age-related osteoporosis without current pathological fracture: Secondary | ICD-10-CM | POA: Diagnosis present

## 2024-02-12 DIAGNOSIS — I447 Left bundle-branch block, unspecified: Secondary | ICD-10-CM | POA: Diagnosis present

## 2024-02-12 DIAGNOSIS — A419 Sepsis, unspecified organism: Secondary | ICD-10-CM | POA: Diagnosis not present

## 2024-02-12 DIAGNOSIS — Z7902 Long term (current) use of antithrombotics/antiplatelets: Secondary | ICD-10-CM

## 2024-02-12 DIAGNOSIS — R531 Weakness: Secondary | ICD-10-CM | POA: Diagnosis not present

## 2024-02-12 DIAGNOSIS — I509 Heart failure, unspecified: Secondary | ICD-10-CM | POA: Diagnosis not present

## 2024-02-12 DIAGNOSIS — J189 Pneumonia, unspecified organism: Secondary | ICD-10-CM | POA: Diagnosis not present

## 2024-02-12 DIAGNOSIS — J9811 Atelectasis: Secondary | ICD-10-CM | POA: Diagnosis not present

## 2024-02-12 DIAGNOSIS — J439 Emphysema, unspecified: Secondary | ICD-10-CM | POA: Diagnosis not present

## 2024-02-12 DIAGNOSIS — R570 Cardiogenic shock: Secondary | ICD-10-CM | POA: Diagnosis not present

## 2024-02-12 DIAGNOSIS — Z515 Encounter for palliative care: Principal | ICD-10-CM

## 2024-02-12 DIAGNOSIS — Z961 Presence of intraocular lens: Secondary | ICD-10-CM | POA: Diagnosis present

## 2024-02-12 DIAGNOSIS — Z7189 Other specified counseling: Secondary | ICD-10-CM | POA: Diagnosis not present

## 2024-02-12 DIAGNOSIS — Z9841 Cataract extraction status, right eye: Secondary | ICD-10-CM

## 2024-02-12 DIAGNOSIS — Z9842 Cataract extraction status, left eye: Secondary | ICD-10-CM

## 2024-02-12 DIAGNOSIS — I890 Lymphedema, not elsewhere classified: Secondary | ICD-10-CM | POA: Diagnosis present

## 2024-02-12 DIAGNOSIS — R609 Edema, unspecified: Secondary | ICD-10-CM | POA: Diagnosis not present

## 2024-02-12 DIAGNOSIS — J449 Chronic obstructive pulmonary disease, unspecified: Secondary | ICD-10-CM | POA: Diagnosis present

## 2024-02-12 DIAGNOSIS — Z800 Family history of malignant neoplasm of digestive organs: Secondary | ICD-10-CM

## 2024-02-12 LAB — COMPREHENSIVE METABOLIC PANEL WITH GFR
ALT: 18 U/L (ref 0–44)
AST: 24 U/L (ref 15–41)
Albumin: 3 g/dL — ABNORMAL LOW (ref 3.5–5.0)
Alkaline Phosphatase: 118 U/L (ref 38–126)
Anion gap: 12 (ref 5–15)
BUN: 64 mg/dL — ABNORMAL HIGH (ref 8–23)
CO2: 20 mmol/L — ABNORMAL LOW (ref 22–32)
Calcium: 8.8 mg/dL — ABNORMAL LOW (ref 8.9–10.3)
Chloride: 102 mmol/L (ref 98–111)
Creatinine, Ser: 2.65 mg/dL — ABNORMAL HIGH (ref 0.61–1.24)
GFR, Estimated: 23 mL/min — ABNORMAL LOW (ref >60)
Glucose, Bld: 135 mg/dL — ABNORMAL HIGH (ref 70–99)
Potassium: 4.3 mmol/L (ref 3.5–5.1)
Sodium: 134 mmol/L — ABNORMAL LOW (ref 135–145)
Total Bilirubin: 2 mg/dL — ABNORMAL HIGH (ref 0.0–1.2)
Total Protein: 6.2 g/dL — ABNORMAL LOW (ref 6.5–8.1)

## 2024-02-12 LAB — URINALYSIS, W/ REFLEX TO CULTURE (INFECTION SUSPECTED)
Bacteria, UA: NONE SEEN
Bilirubin Urine: NEGATIVE
Glucose, UA: NEGATIVE mg/dL
Hgb urine dipstick: NEGATIVE
Ketones, ur: NEGATIVE mg/dL
Leukocytes,Ua: NEGATIVE
Nitrite: NEGATIVE
Protein, ur: NEGATIVE mg/dL
Specific Gravity, Urine: 1.015 (ref 1.005–1.030)
pH: 5 (ref 5.0–8.0)

## 2024-02-12 LAB — CBC WITH DIFFERENTIAL/PLATELET
Abs Immature Granulocytes: 0.12 K/uL — ABNORMAL HIGH (ref 0.00–0.07)
Basophils Absolute: 0 K/uL (ref 0.0–0.1)
Basophils Relative: 0 %
Eosinophils Absolute: 0 K/uL (ref 0.0–0.5)
Eosinophils Relative: 0 %
HCT: 37.8 % — ABNORMAL LOW (ref 39.0–52.0)
Hemoglobin: 11.9 g/dL — ABNORMAL LOW (ref 13.0–17.0)
Immature Granulocytes: 1 %
Lymphocytes Relative: 2 %
Lymphs Abs: 0.2 K/uL — ABNORMAL LOW (ref 0.7–4.0)
MCH: 31.2 pg (ref 26.0–34.0)
MCHC: 31.5 g/dL (ref 30.0–36.0)
MCV: 99.2 fL (ref 80.0–100.0)
Monocytes Absolute: 0.5 K/uL (ref 0.1–1.0)
Monocytes Relative: 5 %
Neutro Abs: 9.9 K/uL — ABNORMAL HIGH (ref 1.7–7.7)
Neutrophils Relative %: 92 %
Platelets: 120 K/uL — ABNORMAL LOW (ref 150–400)
RBC: 3.81 MIL/uL — ABNORMAL LOW (ref 4.22–5.81)
RDW: 18.5 % — ABNORMAL HIGH (ref 11.5–15.5)
WBC: 10.7 K/uL — ABNORMAL HIGH (ref 4.0–10.5)
nRBC: 0 % (ref 0.0–0.2)

## 2024-02-12 LAB — RESP PANEL BY RT-PCR (RSV, FLU A&B, COVID)  RVPGX2
Influenza A by PCR: NEGATIVE
Influenza B by PCR: NEGATIVE
Resp Syncytial Virus by PCR: NEGATIVE
SARS Coronavirus 2 by RT PCR: NEGATIVE

## 2024-02-12 LAB — BRAIN NATRIURETIC PEPTIDE: B Natriuretic Peptide: 666.9 pg/mL — ABNORMAL HIGH (ref 0.0–100.0)

## 2024-02-12 LAB — LACTIC ACID, PLASMA
Lactic Acid, Venous: 2.6 mmol/L (ref 0.5–1.9)
Lactic Acid, Venous: 3.2 mmol/L (ref 0.5–1.9)

## 2024-02-12 LAB — PROTIME-INR
INR: 3.1 — ABNORMAL HIGH (ref 0.8–1.2)
Prothrombin Time: 33.7 s — ABNORMAL HIGH (ref 11.4–15.2)

## 2024-02-12 LAB — TROPONIN I (HIGH SENSITIVITY): Troponin I (High Sensitivity): 11 ng/L (ref <18)

## 2024-02-12 MED ORDER — SODIUM CHLORIDE 0.9 % IV SOLN
2.0000 g | Freq: Once | INTRAVENOUS | Status: AC
  Administered 2024-02-12: 2 g via INTRAVENOUS
  Filled 2024-02-12: qty 12.5

## 2024-02-12 MED ORDER — HALOPERIDOL LACTATE 5 MG/ML IJ SOLN: 2.5000 mg | INTRAMUSCULAR | Status: DC | PRN

## 2024-02-12 MED ORDER — CLOPIDOGREL BISULFATE 75 MG PO TABS
75.0000 mg | ORAL_TABLET | Freq: Every day | ORAL | Status: DC
  Administered 2024-02-13: 75 mg via ORAL

## 2024-02-12 MED ORDER — SODIUM CHLORIDE 0.9 % IV SOLN
250.0000 mL | INTRAVENOUS | Status: AC | PRN
Start: 2024-02-12 — End: 2024-02-13

## 2024-02-12 MED ORDER — FLUTICASONE PROPIONATE 50 MCG/ACT NA SUSP: 2.0000 | Freq: Every day | NASAL | Status: DC | PRN

## 2024-02-12 MED ORDER — ACETAMINOPHEN 650 MG RE SUPP: 650.0000 mg | Freq: Four times a day (QID) | RECTAL | Status: DC | PRN

## 2024-02-12 MED ORDER — SODIUM CHLORIDE 0.9% FLUSH: 3.0000 mL | INTRAVENOUS | Status: DC | PRN

## 2024-02-12 MED ORDER — GLYCOPYRROLATE 0.2 MG/ML IJ SOLN: 0.2000 mg | INTRAMUSCULAR | Status: DC | PRN

## 2024-02-12 MED ORDER — MIDODRINE HCL 5 MG PO TABS
5.0000 mg | ORAL_TABLET | Freq: Three times a day (TID) | ORAL | Status: DC
  Administered 2024-02-12: 5 mg via ORAL
  Filled 2024-02-12: qty 1

## 2024-02-12 MED ORDER — METRONIDAZOLE 500 MG/100ML IV SOLN
500.0000 mg | Freq: Once | INTRAVENOUS | Status: AC
  Administered 2024-02-12: 500 mg via INTRAVENOUS
  Filled 2024-02-12: qty 100

## 2024-02-12 MED ORDER — VANCOMYCIN HCL IN DEXTROSE 1-5 GM/200ML-% IV SOLN
1000.0000 mg | Freq: Once | INTRAVENOUS | Status: DC
  Administered 2024-02-12: 1000 mg via INTRAVENOUS
  Filled 2024-02-12: qty 200

## 2024-02-12 MED ORDER — LEVOTHYROXINE SODIUM 100 MCG PO TABS: 100.0000 ug | ORAL_TABLET | Freq: Every day | ORAL | Status: DC

## 2024-02-12 MED ORDER — ORAL CARE MOUTH RINSE
15.0000 mL | OROMUCOSAL | Status: DC | PRN
Start: 2024-02-12 — End: 2024-02-14

## 2024-02-12 MED ORDER — MORPHINE SULFATE (PF) 2 MG/ML IV SOLN
2.0000 mg | INTRAVENOUS | Status: DC | PRN
  Administered 2024-02-12 – 2024-02-13 (×3): 4 mg via INTRAVENOUS
  Filled 2024-02-12 (×3): qty 2

## 2024-02-12 MED ORDER — AMIODARONE HCL 200 MG PO TABS
200.0000 mg | ORAL_TABLET | Freq: Every day | ORAL | Status: DC
  Administered 2024-02-13: 200 mg via ORAL

## 2024-02-12 MED ORDER — DIPHENHYDRAMINE HCL 50 MG/ML IJ SOLN
25.0000 mg | INTRAMUSCULAR | Status: DC | PRN
  Administered 2024-02-13: 25 mg via INTRAVENOUS
  Filled 2024-02-12: qty 1

## 2024-02-12 MED ORDER — SENNOSIDES-DOCUSATE SODIUM 8.6-50 MG PO TABS: 1.0000 | ORAL_TABLET | Freq: Every evening | ORAL | Status: DC | PRN

## 2024-02-12 MED ORDER — ACETAMINOPHEN 325 MG PO TABS: 650.0000 mg | ORAL_TABLET | ORAL | Status: DC | PRN

## 2024-02-12 MED ORDER — MILRINONE LACTATE IN DEXTROSE 20-5 MG/100ML-% IV SOLN
0.2500 ug/kg/min | INTRAVENOUS | Status: DC
  Administered 2024-02-12: 0.25 ug/kg/min via INTRAVENOUS
  Filled 2024-02-12: qty 100

## 2024-02-12 MED ORDER — GLYCOPYRROLATE 0.2 MG/ML IJ SOLN
0.2000 mg | INTRAMUSCULAR | Status: DC | PRN
Start: 2024-02-12 — End: 2024-02-14
  Administered 2024-02-13: 0.2 mg via INTRAVENOUS
  Filled 2024-02-12: qty 1

## 2024-02-12 MED ORDER — FUROSEMIDE 10 MG/ML IJ SOLN
60.0000 mg | Freq: Once | INTRAMUSCULAR | Status: AC
  Administered 2024-02-12: 60 mg via INTRAVENOUS
  Filled 2024-02-12: qty 8

## 2024-02-12 MED ORDER — MIDAZOLAM HCL 2 MG/2ML IJ SOLN: 2.0000 mg | INTRAMUSCULAR | Status: DC | PRN

## 2024-02-12 MED ORDER — SODIUM CHLORIDE 0.9 % IV SOLN: INTRAVENOUS | Status: AC

## 2024-02-12 MED ORDER — DOPAMINE-DEXTROSE 3.2-5 MG/ML-% IV SOLN
5.0000 ug/kg/min | INTRAVENOUS | Status: DC
  Administered 2024-02-12: 5 ug/kg/min via INTRAVENOUS
  Filled 2024-02-12: qty 250

## 2024-02-12 MED ORDER — ONDANSETRON HCL 4 MG/2ML IJ SOLN
4.0000 mg | Freq: Four times a day (QID) | INTRAMUSCULAR | Status: DC | PRN
  Administered 2024-02-12: 4 mg via INTRAVENOUS
  Filled 2024-02-12: qty 2

## 2024-02-12 MED ORDER — GABAPENTIN 300 MG PO CAPS
300.0000 mg | ORAL_CAPSULE | Freq: Two times a day (BID) | ORAL | Status: DC
  Administered 2024-02-12 – 2024-02-13 (×2): 300 mg via ORAL
  Filled 2024-02-12: qty 1

## 2024-02-12 MED ORDER — POLYVINYL ALCOHOL 1.4 % OP SOLN: 1.0000 [drp] | Freq: Four times a day (QID) | OPHTHALMIC | Status: DC | PRN

## 2024-02-12 MED ORDER — GLYCOPYRROLATE 1 MG PO TABS: 1.0000 mg | ORAL_TABLET | ORAL | Status: DC | PRN

## 2024-02-12 MED ORDER — SODIUM CHLORIDE 0.9% FLUSH
3.0000 mL | Freq: Two times a day (BID) | INTRAVENOUS | Status: DC
  Administered 2024-02-12 – 2024-02-13 (×3): 3 mL via INTRAVENOUS

## 2024-02-12 MED ORDER — PRAVASTATIN SODIUM 20 MG PO TABS
20.0000 mg | ORAL_TABLET | Freq: Every day | ORAL | Status: DC
  Administered 2024-02-12: 20 mg via ORAL
  Filled 2024-02-12: qty 1

## 2024-02-12 MED ORDER — ONDANSETRON HCL 4 MG/2ML IJ SOLN: 4.0000 mg | Freq: Four times a day (QID) | INTRAMUSCULAR | Status: DC | PRN

## 2024-02-12 MED ORDER — ACETAMINOPHEN 325 MG PO TABS
650.0000 mg | ORAL_TABLET | Freq: Four times a day (QID) | ORAL | Status: DC | PRN
Start: 2024-02-12 — End: 2024-02-14

## 2024-02-12 MED ORDER — ONDANSETRON 4 MG PO TBDP: 4.0000 mg | ORAL_TABLET | Freq: Four times a day (QID) | ORAL | Status: DC | PRN

## 2024-02-12 MED ORDER — RIVAROXABAN 15 MG PO TABS
15.0000 mg | ORAL_TABLET | Freq: Every day | ORAL | Status: DC
  Filled 2024-02-12: qty 1

## 2024-02-12 MED ORDER — SODIUM CHLORIDE 0.9 % IV SOLN: 2.0000 g | INTRAVENOUS | Status: DC

## 2024-02-12 NOTE — ED Notes (Signed)
 Per

## 2024-02-12 NOTE — Consult Note (Signed)
 CODE SEPSIS - PHARMACY COMMUNICATION  **Broad Spectrum Antibiotics should be administered within 1 hour of Sepsis diagnosis**  Time Code Sepsis Called/Page Received: 1249  Antibiotics Ordered: cefepime , Flagyl  and vancomycin   Time of 1st antibiotic administration: 1316  Additional action taken by pharmacy: N/A   Kayla JULIANNA Blew ,PharmD Clinical Pharmacist  02/12/2024  1:30 PM

## 2024-02-12 NOTE — IPAL (Signed)
  Interdisciplinary Goals of Care Family Meeting   Date carried out: 02/12/2024  Location of the meeting: Bedside  Member's involved: Physician and Nurse Practitioner  Durable Power of Attorney or acting medical decision maker: Patient at bedside (he is awake, alert and oriented), along with his daughter [name] [facility]) via telephone conference call  Discussion: We discussed goals of care for [name] MEDEROS Sr. .  Reviewed findings so far including Acute on Chronic Hypoxic Respiratory Failure due to Acute Decompensated HFrEF, Multifactorial shock (Cardiogenic + Septic), Severe Sepsis due to bilateral LE Cellulitis, along with AKI on CKD due to concern for Cardiorenal syndrome.  He currently endorses severe pain in his back, hips,and lower extremities, and that he has been suffering for a long term.  We reviewed that the current treatment plan is to admit to ICU for antibiotics, inotropes, vasopressors, and diuresis.  With this plan, we will be limited in being able to treat his pain with current critical state.  He confirms DNR/DNI status, and he endorses that he is not interested in being admitted to ICU, nor having aggressive care implemented including central line placement, inotropes, vasopressors, dialysis, etc. He states he has been declining for a long time and he is ready to go.  We discussed that if we do not implement the above plan, he will pass away.  He states is at peace with passing away and knows his time is short.  He wants to focus on comfort for the time he has left.    He requests that we transition to COMFORT MEASURES only.  His daughter was listening and engaging in this conversation over the telephone.  She also acknowledged his decline and how sick her father is.  She states that she is ok and in agreement with him just focusing if those are his wishes.  [name] agrees to and requests transition to COMFORT MEASURES ONLY.    Code status:   Code Status: Do not  attempt resuscitation (DNR) - Comfort care   Disposition: In-patient comfort care  Time spent for the meeting: 20 minutes     Inge Lecher, AGACNP-BC [facility] Prefer epic messenger for cross cover needs If after hours, please call E-link  Inge JONETTA Lecher, NP  02/12/2024, 5:17 PM

## 2024-02-12 NOTE — ED Provider Notes (Signed)
 [facility] Provider Note    Event Date/Time   First MD Initiated Contact with Patient 02/12/24 1243     (approximate)  History   Chief Complaint: Weakness  HPI  [name] AUSTAD Sr. is a 83 y.o. male with a past medical history of atrial fibrillation on Xarelto  and amiodarone , CAD, CHF, COPD, hypertension, hyperlipidemia, chronic lower extremity edema, presents to the emergency department for generalized weakness fatigue found to be hypotensive 70/50.  Patient also states worsening shortness of breath over the last few days.  Patient states he recently went home from his rehab facility, states he has been home 3 to 4 days.  I reviewed the patient's discharge summary from 01/15/2024 patient was admitted at time for respiratory failure thought to be due to CHF.  Noted at that time to have chronic bilateral lower extremity edema.  Patient was at rehab for nearly 3 weeks per patient before going home.  Patient has noticed increased swelling in his legs especially his left leg that has now become red and weeping.  Patient also states increased shortness of breath, currently satting 90 to 92% on room air while sitting in bed.  Physical Exam   Triage Vital Signs: ED Triage Vitals  Encounter Vitals Group     BP 02/12/24 1230 (!) 74/50     Girls Systolic BP Percentile --      Girls Diastolic BP Percentile --      Boys Systolic BP Percentile --      Boys Diastolic BP Percentile --      Pulse Rate 02/12/24 1230 74     Resp 02/12/24 1230 18     Temp --      Temp src --      SpO2 02/12/24 1230 94 %     Weight 02/12/24 1235 152 lb 1.6 oz (69 kg)     Height 02/12/24 1235 6' 2 (1.88 m)     Head Circumference --      Peak Flow --      Pain Score 02/12/24 1234 0     Pain Loc --      Pain Education --      Exclude from Growth Chart --     Most recent vital signs: Vitals:   02/12/24 1245 02/12/24 1257  BP: (!) 82/43 (!) 87/59  Pulse: 89 (!) 25  Resp: 19 10  SpO2:  92% 91%    General: Awake, no distress.  CV:  Good peripheral perfusion.  Regular rate and rhythm  Resp:  Normal effort.  Equal breath sounds bilaterally.  Abd:  No distention.  Soft, nontender.  No rebound or guarding. Other:  Bilateral lower extremity edema left greater than right with erythema and weeping of the left lower extremity.   ED Results / Procedures / Treatments   EKG  EKG viewed and interpreted by myself shows what appears to be an irregular rhythm likely atrial fibrillation with a widened QRS, nonspecific ST changes most consistent with left bundle branch block.  Not significantly changed from prior EKG 01/14/2024  RADIOLOGY  I have reviewed interpret chest x-ray images.  Patient appears to have some mild haziness bilaterally. Radiology is read the x-ray is cardiomegaly with interstitial edema.  Moderate volume right pleural effusion.   MEDICATIONS ORDERED IN ED: Medications  ceFEPIme  (MAXIPIME ) 2 g in sodium chloride  0.9 % 100 mL IVPB (2 g Intravenous New Bag/Given 02/12/24 1316)  metroNIDAZOLE  (FLAGYL ) IVPB 500 mg (has no administration in  time range)  vancomycin  (VANCOCIN ) IVPB 1000 mg/200 mL premix (has no administration in time range)     IMPRESSION / MDM / ASSESSMENT AND PLAN / ED COURSE  I reviewed the triage vital signs and the nursing notes.  Patient's presentation is most consistent with acute presentation with potential threat to life or bodily function.  Patient presents to the emergency department for generalized weakness found to be hypotensive, now up to 87/59 in the emergency department prior to intervention.  Given the patient's lower extremity erythema and weepage highly suspect cellulitis.  Patient states some shortness of breath currently satting 91% on room air we will obtain a chest x-ray in addition to her labs and cultures.  Will start the patient on broad-spectrum antibiotics while waiting for the results.  Patient will require admission to the  hospital once his workup is been completed.  Patient found to have a rectal temperature of 93.9 and placed on a Bair hugger.  Patient's chest x-ray is consistent with CHF with interstitial edema.  Patient's respiratory panel negative, chemistry shows renal insufficiency with a creatinine of 2.6 increased from 2.0.  Patient CBC shows no significant finding.  Lactic acid elevated to 2.6.  We will continue with IV antibiotics for likely left lower extremity cellulitis.  Patient found to be hypothermic on a Lawyer.  Otherwise symptoms are concerning for CHF exacerbation.  Blood pressures continue to be between 85 and 98 systolic.  Patient is awake alert mentating well answering all questions appropriately.  CRITICAL CARE Performed by: Franky Moores   Total critical care time: 30 minutes  Critical care time was exclusive of separately billable procedures and treating other patients.  Critical care was necessary to treat or prevent imminent or life-threatening deterioration.  Critical care was time spent personally by me on the following activities: development of treatment plan with patient and/or surrogate as well as nursing, discussions with consultants, evaluation of patient's response to treatment, examination of patient, obtaining history from patient or surrogate, ordering and performing treatments and interventions, ordering and review of laboratory studies, ordering and review of radiographic studies, pulse oximetry and re-evaluation of patient's condition.   FINAL CLINICAL IMPRESSION(S) / ED DIAGNOSES   Sepsis Hypotension Hypothermia Left lower extremity cellulitis   Note:  This document was prepared using Dragon voice recognition software and may include unintentional dictation errors.   Moores Franky, MD 02/12/24 1421

## 2024-02-12 NOTE — H&P (Signed)
 History and Physical:    [name] ORTEZ Sr.   FMW:[id number] DOB: [birth year] DOA: 02/12/2024  Referring MD/provider:  Franky Moores, MD PCP: Sharma Coyer, MD   Patient coming from: Home  Chief Complaint: General Weakness shortness of breath  History of Present Illness:   [name] SPRATT Sr. is a 83 y.o. male with medical history significant for paroxysmal atrial fibrillation on Xarelto, chronic systolic CHF with EF of 25% in March 2025, PVD, LBBB, CKD stage IV, CAD, PAD, lumbar spine degenerative disc disease, allergies, GERD, hypertension, hyperlipidemia, hypothyroidism, prediabetes.  He was recently discharged from the hospital, to the nursing home, on 01/15/2024 after hospitalization for acute hypoxic respiratory failure from pneumonia.  He states he at the [facility] rehab facility for about 3 weeks and was discharged home.  He complained of increasing general weakness, increasing lower extremity swelling and shortness of breath for the past few days.  He also developed a cough about a day prior to admission. Reportedly, oxygen saturation was 80% on room air when EMS arrived at his home.   ED Course: In the ED, initial BP on arrival was 74/50 with MAP of 60.  Temperature was 93.9 F, respiratory rate 18, oxygen saturation 94% on room air.  Oxygen saturation dropped to 83% on room air in the ED.  He was given IV cefepime, vancomycin, Flagyl and Lasix in the ED.   Sodium 134, BUN 64, creatinine 2.65, lactic acid 2.6, WBC 10.7, hemoglobin 11.8, hematocrit 37.8, plate [id number].  Chest x-ray:  IMPRESSION: 1. Moderate cardiomegaly with findings of interstitial edema. 2. New small to moderate volume right pleural effusion with right basilar atelectasis. Trace left pleural effusion.     ROS:   ROS all other systems reviewed were negative.  Past Medical History:   Past Medical History:  Diagnosis Date   A-fib [facility])    a.) CHA2DS2VASc = 5 (age x2, CHF, HTN,  vascular disease history);  b.) rate/rhythm maintained on oral amiodarone; chronically anticoagulated with rivaroxaban   Allergy    Arthritis    CAD (coronary artery disease) 11/17/2012   a.) LHC 11/17/2012: 30% mLM, 30% mLAD, 30% mLCx, 50%/30% OM1, 30% pRCA - med mgmt   Chronic systolic CHF (congestive heart failure), NYHA class 2 (HCC)    a.) TTE 05/30/2015: EF 40%, glob HK, mild LVH, mild LAE, mild MR/TR; b.) TTE 10/14/2017: EF 40%, glob HK, mild LVH, mod BAE, triv PR, mild MR/TR, G1DD   Complication of anesthesia    a.) hypotension during routine colonoscopy   COPD (chronic obstructive pulmonary disease) (HCC)    DDD (degenerative disc disease), lumbar    Dilated cardiomyopathy (HCC)    a.) LHC 11/17/2012: EF 35%; b.) TTE 05/30/2015: EF 40%; c.) TTE 10/14/2017: EF 40%   GERD (gastroesophageal reflux disease)    Grade I diastolic dysfunction    History of chicken pox    Hyperlipidemia    Hypertension    Hypothyroidism    Left bundle branch block    Long term current use of amiodarone    Long term current use of anticoagulant    a.) Rivaroxaban   Osteoporosis    Peripheral vascular disease (HCC)    Pre-diabetes    Spinal stenosis of lumbar region without neurogenic claudication    Stage 3b chronic kidney disease (HCC)    Wears dentures    full upper and lower    Past Surgical History:   Past Surgical History:  Procedure Laterality Date  BACK SURGERY     x5   CATARACT EXTRACTION W/PHACO Left 11/24/2023   Procedure: PHACOEMULSIFICATION, CATARACT, WITH IOL INSERTION 29.25 02:17.6;  Surgeon: Jaye Fallow, MD;  Location: [facility];  Service: Ophthalmology;  Laterality: Left;   CATARACT EXTRACTION W/PHACO Right 12/08/2023   Procedure: PHACOEMULSIFICATION, CATARACT, WITH IOL INSERTION 9.35 01:00.7;  Surgeon: Jaye Fallow, MD;  Location: [facility];  Service: Ophthalmology;  Laterality: Right;   DOPPLER ECHOCARDIOGRAPHY  06/21/2013   EF=35% while in  Sinus Rhythm   ENDARTERECTOMY FEMORAL Left 10/22/2022   Procedure: ENDARTERECTOMY FEMORAL;  Surgeon: Jama Cordella MATSU, MD;  Location: [facility] ORS;  Service: Vascular;  Laterality: Left;   FEMORAL-TIBIAL BYPASS GRAFT Left 10/22/2022   Procedure: BYPASS GRAFT FEMORAL-TIBIAL ARTERY;  Surgeon: Jama Cordella MATSU, MD;  Location: [facility] ORS;  Service: Vascular;  Laterality: Left;   LEFT HEART CATH AND CORONARY ANGIOGRAPHY Left 11/17/2012   Procedure: LEFT HEART CATHETERIZATION AND CORONARY ANGIOGRAPHY; Location: [facility]; Surgeon: Marsa Dooms, MD   LOWER EXTREMITY ANGIOGRAPHY Right 05/20/2022   Procedure: Lower Extremity Angiography;  Surgeon: Jama Cordella MATSU, MD;  Location: [facility] INVASIVE CV LAB;  Service: Cardiovascular;  Laterality: Right;   LOWER EXTREMITY ANGIOGRAPHY Left 07/15/2022   Procedure: Lower Extremity Angiography;  Surgeon: Jama Cordella MATSU, MD;  Location: [facility] INVASIVE CV LAB;  Service: Cardiovascular;  Laterality: Left;   Myocardial perfusion scan  10/28/2012   severe global LV enlargement. Mild RV enlargement. Mild LVH. Mild mitral and tricuspid insufficency   polyp excision  09/28/2005   UPPER GASTROINTESTINAL ENDOSCOPY  03/27/2004   Gastropathy, Gastritis; Duodenopathy    Social History:   Social History   Socioeconomic History   Marital status: Widowed    Spouse name: Not on file   Number of children: 2   Years of education: Not on file   Highest education level: 12th grade  Occupational History   Occupation: Retired/ Disabled  Tobacco Use   Smoking status: Former    Current packs/day: 0.00    Average packs/day: 2.0 packs/day for 50.0 years (100.0 ttl pk-yrs)    Types: Cigarettes    Start date: 06/30/1957    Quit date: 07/01/2007    Years since quitting: 16.6   Smokeless tobacco: Never  Vaping Use   Vaping status: Never Used  Substance and Sexual Activity   Alcohol use: No    Alcohol/week: 0.0 standard drinks of alcohol   Drug use: No   Sexual activity: Not  on file  Other Topics Concern   Not on file  Social History Narrative   Not on file   Social Drivers of Health   Financial Resource Strain: Low Risk  (12/28/2023)   Overall Financial Resource Strain (CARDIA)    Difficulty of Paying Living Expenses: Not very hard  Food Insecurity: No Food Insecurity (01/12/2024)   Hunger Vital Sign    Worried About Running Out of Food in the Last Year: Never true    Ran Out of Food in the Last Year: Never true  Transportation Needs: No Transportation Needs (01/12/2024)   PRAPARE - Administrator, Civil Service (Medical): No    Lack of Transportation (Non-Medical): No  Physical Activity: Sufficiently Active (10/06/2023)   Exercise Vital Sign    Days of Exercise per Week: 7 days    Minutes of Exercise per Session: 40 min  Stress: No Stress Concern Present (12/28/2023)   [employer] of Occupational Health - Occupational Stress Questionnaire    Feeling of Stress: Not at  all  Social Connections: Unknown (01/15/2024)   Social Connection and Isolation Panel    Frequency of Communication with Friends and Family: More than three times a week    Frequency of Social Gatherings with Friends and Family: More than three times a week    Attends Religious Services: Not on file    Active Member of Clubs or Organizations: Not on file    Attends Banker Meetings: Never    Marital Status: Widowed  Intimate Partner Violence: Not At Risk (01/13/2024)   Humiliation, Afraid, Rape, and Kick questionnaire    Fear of Current or Ex-Partner: No    Emotionally Abused: No    Physically Abused: No    Sexually Abused: No    Allergies   Patient has no known allergies.  Family history:   Family History  Problem Relation Age of Onset   Alzheimer's disease Mother    Heart attack Father    Colon cancer Brother     Current Medications:   Prior to Admission medications   Medication Sig Start Date End Date Taking? Authorizing Provider   amiodarone (PACERONE) 200 MG tablet Take 1 tablet (200 mg total) by mouth daily. 01/27/23  Yes [name], [name], MD  clopidogrel (PLAVIX) 75 MG tablet Take 1 tablet (75 mg total) by mouth daily. 10/28/22  Yes Pace, Brien R, NP  FEROSUL 325 (65 Fe) MG tablet TAKE 1 TABLET BY MOUTH DAILY WITH BREAKFAST 01/07/24  Yes [name], [name], MD  fluticasone (FLONASE) 50 MCG/ACT nasal spray Place 2 sprays into both nostrils daily. Patient taking differently: Place 2 sprays into both nostrils daily as needed. 10/02/21  Yes Emilio Kelly DASEN, FNP  gabapentin (NEURONTIN) 300 MG capsule Take 1 capsule (300 mg total) by mouth 2 (two) times daily. 01/15/24 02/14/24 Yes Barbarann Nest, MD  levothyroxine (SYNTHROID) 100 MCG tablet TAKE 1 TABLET BY MOUTH DAILY BEFORE BREAKFAST 02/08/24  Yes [name], [name], MD  lovastatin (MEVACOR) 20 MG tablet Take 1 tablet (20 mg total) by mouth at bedtime. 10/28/23  Yes [name], [name], MD  traMADol (ULTRAM) 50 MG tablet Take 1 tablet (50 mg total) by mouth 2 (two) times daily as needed. 01/15/24  Yes Barbarann Nest, MD  XARELTO 20 MG TABS tablet Take 1 tablet (20 mg total) by mouth daily. 01/27/23  Yes [name], [name], MD  lisinopril (ZESTRIL) 2.5 MG tablet Take 1 tablet (2.5 mg total) by mouth daily. Patient not taking: Reported on 02/05/2024 01/27/23   [name], Rockie, MD  mupirocin ointment (BACTROBAN) 2 % Apply 1 Application topically daily. 11/20/22   Brown, [name], NP  torsemide (DEMADEX) 20 MG tablet Take 1 tablet (20 mg total) by mouth 2 (two) times daily. Patient not taking: Reported on 02/05/2024 04/29/23   Sharma Rockie, MD    Physical Exam:   Vitals:   02/12/24 1235 02/12/24 1245 02/12/24 1257 02/12/24 1323  BP:  (!) 82/43 (!) 87/59   Pulse:  89 (!) 25   Resp:  19 10   Temp:    (!) 93.9 F (34.4 C)  TempSrc:    Oral  SpO2:  92% 91%   Weight: 69 kg     Height: 6' 2 (1.88 m)        Physical  Exam: Blood pressure (!) 87/59, pulse (!) 25, temperature (!) 93.9 F (34.4 C), temperature source Oral, resp. rate 10, height 6' 2 (1.88 m), weight 69 kg, SpO2 91%. Gen: No acute distress. Head: Normocephalic, atraumatic. Eyes: Pupils equal, round and  reactive to light. Extraocular movements intact.  Sclerae nonicteric.  Mouth: Moist mucous membranes Neck: Supple, no thyromegaly, no lymphadenopathy, + jugular venous distention. Chest: Bilateral rhonchi, bibasilar rales CV: Heart sounds are regular with an S1, S2. No murmurs, rubs or gallops.  Abdomen: Soft, nontender, nondistended with normal active bowel sounds. No palpable masses. Extremities: Extremities are without clubbing, or cyanosis.  Significant bilateral lower extremity edema.  Bilateral leg erythema.  No tenderness. Skin: Warm and dry.  Chronic erythematous changes of bilateral legs. Neuro: Alert and oriented times 3; grossly nonfocal.  Psych: Insight is good and judgment is appropriate. Mood and affect normal.   Data Review:    Labs: Basic Metabolic Panel: Recent Labs  Lab 02/12/24 1243  NA 134*  K 4.3  CL 102  CO2 20*  GLUCOSE 135*  BUN 64*  CREATININE 2.65*  CALCIUM 8.8*   Liver Function Tests: Recent Labs  Lab 02/12/24 1243  AST 24  ALT 18  ALKPHOS 118  BILITOT 2.0*  PROT 6.2*  ALBUMIN 3.0*   No results for input(s): LIPASE, AMYLASE in the last 168 hours. No results for input(s): AMMONIA in the last 168 hours. CBC: Recent Labs  Lab 02/12/24 1243  WBC 10.7*  NEUTROABS 9.9*  HGB 11.9*  HCT 37.8*  MCV 99.2  PLT 120*   Cardiac Enzymes: No results for input(s): CKTOTAL, CKMB, CKMBINDEX, TROPONINI in the last 168 hours.  BNP (last 3 results) No results for input(s): PROBNP in the last 8760 hours. CBG: No results for input(s): GLUCAP in the last 168 hours.  Urinalysis    Component Value Date/Time   COLORURINE YELLOW (A) 01/12/2024 1309   APPEARANCEUR CLEAR (A)  01/12/2024 1309   LABSPEC 1.011 01/12/2024 1309   PHURINE 5.0 01/12/2024 1309   GLUCOSEU NEGATIVE 01/12/2024 1309   HGBUR NEGATIVE 01/12/2024 1309   BILIRUBINUR NEGATIVE 01/12/2024 1309   KETONESUR NEGATIVE 01/12/2024 1309   PROTEINUR NEGATIVE 01/12/2024 1309   NITRITE NEGATIVE 01/12/2024 1309   LEUKOCYTESUR NEGATIVE 01/12/2024 1309      Radiographic Studies: DG Chest Port 1 View if patient is in a treatment room. Result Date: 02/12/2024 CLINICAL DATA:  Suspected Sepsis EXAM: PORTABLE CHEST - 1 VIEW COMPARISON:  January 12, 2024 FINDINGS: Diffuse interstitial opacities throughout both lungs. Interval development of a small to moderate-sized right pleural effusion with right basilar airspace disease, likely atelectasis. No pneumothorax. Trace left pleural effusion. Biapical pleuroparenchymal scarring. Moderate cardiomegaly. Tortuous aorta with aortic atherosclerosis. No acute fracture or destructive lesions. Multilevel thoracic osteophytosis. IMPRESSION: 1. Moderate cardiomegaly with findings of interstitial edema. 2. New small to moderate volume right pleural effusion with right basilar atelectasis. Trace left pleural effusion. Electronically Signed   By: Rogelia Myers M.D.   On: 02/12/2024 14:07    EKG: Independently reviewed wide QRS rhythm with intraventricular conduction delay.    Assessment/Plan:   Principal Problem:   Acute on chronic HFrEF (heart failure with reduced ejection fraction) (HCC)   Body mass index is 19.53 kg/m.    Acute on chronic HFrEF: Admit to stepdown unit.  S/p IV Lasix  60 mg x 1 dose in the ED.  Will hold off IV diuresis for now because of hypotension Consulted Dr. Ammon, cardiologist, to assist with management because of concern for cardiogenic shock as a differential diagnosis 2D echo on September 16, 2022 showed EF of 25%, severely enlarged left atrium.    Hypotension: He has been started on midodrine .  Consulted intensivist, Dr. Aleskerov.  At this  point, BP has improved from systolic of 70s to 90s and he does not think he needs IV vasopressors.  However, he will see the patient in consultation.  He will take over the case if patient's condition worsens or if IV vasopressors are required.    Hypothermia: Probably from CHF. ?  Sepsis.  Continue empiric IV antibiotics for now.    Lactic acidosis: Probably from hypotension. Sepsis suspected as a differential diagnosis.  Left leg cellulitis suspected.  However, patient has chronic venous insufficiency with chronic erythematous changes in bilateral legs.  He has worsening peripheral edema but no tenderness.  Unable to completely exclude cellulitis at this time. He was given IV cefepime , Flagyl  and vancomycin  in the ED. Continue empiric IV cefepime  for now.  Follow-up blood cultures.    Acute kidney injury on CKD stage IV: Creatinine up to 2.65.  This is probably from cardiorenal syndrome.  Monitor BMP. Creatinine was 1.95 on 01/15/2024.     Paroxysmal atrial fibrillation: Continue amiodarone  and Xarelto     Thrombocytopenia: Monitor platelet count   Comorbidities include CAD, PAD, hypertension, hyperlipidemia, BPPV, recent hospitalization for pneumonia and acute respiratory failure in July 2025.   Plan of care was discussed with Dr. Aleskerov, intensivist.  Initially, patient was to be admitted to the stepdown unit.  However, he is now going to be started on dopamine  drip and milrinone  drip. Dr. Aleskerov, has decided to take over the care of this patient.    Other information:   DVT prophylaxis:   Code Status: DNR.  This was confirmed by patient.  He also has a DNR golden form Family Communication: None  Disposition Plan: Plan to discharge home Consults called: Intensivist Admission status: Inpatient      Maron Stanzione [facility] Pager: Please check [email or web address]   How to contact the [facility] Attending or Consulting provider 7A - 7P or covering provider during  after hours 7P -7A, for this patient?   Check the care team in [facility] and look for a) attending/consulting [facility] provider listed and b) the [facility] team listed Log into [email or web address] and use [facility]'s universal password to access. If you do not have the password, please contact the hospital operator. Locate the [facility] provider you are looking for under [facility] and page to a number that you can be directly reached. If you still have difficulty reaching the provider, please page the [facility] (Director on Call) for the Hospitalists listed on amion for assistance.  02/12/2024, 3:22 PM

## 2024-02-12 NOTE — Sepsis Progress Note (Signed)
 Sepsis protocol monitored by eLink

## 2024-02-12 NOTE — Consult Note (Signed)
 [facility] Cardiology  CARDIOLOGY CONSULT NOTE  Patient ID: [name]. MRN: [id number] DOB/AGE: 1941-03-25 83 y.o.  Admit date: 02/12/2024 Referring Physician Jens Primary Physician Fisher Primary Cardiologist Hamed Debella Reason for Consultation cardiogenic and septic shock  HPI: 83 year old gentleman referred for evaluation of cardiogenic and septic shock.  Patient has known history of nonischemic dilated cardiomyopathy, LVEF 25-30%, with chronic HFrEF.  Patient also has chronic lymphedema, with peripheral vascular disease, status post left tibial bypass and left femoral endarterectomy.  The patient also has stage IV chronic kidney disease.  Presents with chief complaint of unable to get out of bed, generalized weakness, worsening peripheral edema and cellulitis.  In the emergency room, patient noted to be hypotensive, chest x-ray revealing interstitial edema, lactic acid mildly elevated.  Admission labs notable for BUN and creatinine of 64 and 2.65, respectively.  BNP elevated 666.9.  ECG reveals probable atrial fibrillation with wide QRS rhythm with premature ventricular contractions.  Patient has history of paroxysmal atrial fibrillation, on Xarelto  for stroke prevention, and amiodarone  for rhythm control.  Review of systems complete and found to be negative unless listed above     Past Medical History:  Diagnosis Date   A-fib [facility])    a.) CHA2DS2VASc = 5 (age x2, CHF, HTN, vascular disease history);  b.) rate/rhythm maintained on oral amiodarone ; chronically anticoagulated with rivaroxaban    Allergy    Arthritis    CAD (coronary artery disease) 11/17/2012   a.) LHC 11/17/2012: 30% mLM, 30% mLAD, 30% mLCx, 50%/30% OM1, 30% pRCA - med mgmt   Chronic systolic CHF (congestive heart failure), NYHA class 2 (HCC)    a.) TTE 05/30/2015: EF 40%, glob HK, mild LVH, mild LAE, mild MR/TR; b.) TTE 10/14/2017: EF 40%, glob HK, mild LVH, mod BAE, triv PR, mild MR/TR, G1DD   Complication of  anesthesia    a.) hypotension during routine colonoscopy   COPD (chronic obstructive pulmonary disease) (HCC)    DDD (degenerative disc disease), lumbar    Dilated cardiomyopathy (HCC)    a.) LHC 11/17/2012: EF 35%; b.) TTE 05/30/2015: EF 40%; c.) TTE 10/14/2017: EF 40%   GERD (gastroesophageal reflux disease)    Grade I diastolic dysfunction    History of chicken pox    Hyperlipidemia    Hypertension    Hypothyroidism    Left bundle branch block    Long term current use of amiodarone     Long term current use of anticoagulant    a.) Rivaroxaban    Osteoporosis    Peripheral vascular disease (HCC)    Pre-diabetes    Spinal stenosis of lumbar region without neurogenic claudication    Stage 3b chronic kidney disease (HCC)    Wears dentures    full upper and lower    Past Surgical History:  Procedure Laterality Date   BACK SURGERY     x5   CATARACT EXTRACTION W/PHACO Left 11/24/2023   Procedure: PHACOEMULSIFICATION, CATARACT, WITH IOL INSERTION 29.25 02:17.6;  Surgeon: Jaye Fallow, MD;  Location: [facility];  Service: Ophthalmology;  Laterality: Left;   CATARACT EXTRACTION W/PHACO Right 12/08/2023   Procedure: PHACOEMULSIFICATION, CATARACT, WITH IOL INSERTION 9.35 01:00.7;  Surgeon: Jaye Fallow, MD;  Location: [facility];  Service: Ophthalmology;  Laterality: Right;   DOPPLER ECHOCARDIOGRAPHY  06/21/2013   EF=35% while in Sinus Rhythm   ENDARTERECTOMY FEMORAL Left 10/22/2022   Procedure: ENDARTERECTOMY FEMORAL;  Surgeon: Jama Cordella MATSU, MD;  Location: [facility] ORS;  Service: Vascular;  Laterality: Left;   FEMORAL-TIBIAL  BYPASS GRAFT Left 10/22/2022   Procedure: BYPASS GRAFT FEMORAL-TIBIAL ARTERY;  Surgeon: Jama Cordella MATSU, MD;  Location: [facility] OR[facility];  Service: Vascular;  Laterality: Left;   LEFT HEART CATH AND CORONARY ANGIOGRAPHY Left 11/17/2012   Procedure: LEFT HEART CATHETERIZATION AND CORONARY ANGIOGRAPHY; Location: [facility]; Surgeon: Marsa Dooms,  MD   LOWER EXTREMITY ANGIOGRAPHY Right 05/20/2022   Procedure: Lower Extremity Angiography;  Surgeon: Jama Cordella MATSU, MD;  Location: [facility];  Service: Cardiovascular;  Laterality: Right;   LOWER EXTREMITY ANGIOGRAPHY Left 07/15/2022   Procedure: Lower Extremity Angiography;  Surgeon: Jama Cordella MATSU, MD;  Location: [facility] INVASIVE CV LAB;  Service: Cardiovascular;  Laterality: Left;   Myocardial perfusion scan  10/28/2012   severe global LV enlargement. Mild RV enlargement. Mild LVH. Mild mitral and tricuspid insufficency   polyp excision  09/28/2005   UPPER GASTROINTESTINAL ENDOSCOPY  03/27/2004   Gastropathy, Gastritis; Duodenopathy    (Not in a hospital admission)  Social History   Socioeconomic History   Marital status: Widowed    Spouse name: Not on file   Number of children: 2   Years of education: Not on file   Highest education level: 12th grade  Occupational History   Occupation: Retired/ Disabled  Tobacco Use   Smoking status: Former    Current packs/day: 0.00    Average packs/day: 2.0 packs/day for 50.0 years (100.0 ttl pk-yrs)    Types: Cigarettes    Start date: 06/30/1957    Quit date: 07/01/2007    Years since quitting: 16.6   Smokeless tobacco: Never  Vaping Use   Vaping status: Never Used  Substance and Sexual Activity   Alcohol  use: No    Alcohol /week: 0.0 standard drinks of alcohol    Drug use: No   Sexual activity: Not on file  Other Topics Concern   Not on file  Social History Narrative   Not on file   Social Drivers of Health   Financial Resource Strain: Low Risk  (12/28/2023)   Overall Financial Resource Strain (CARDIA)    Difficulty of Paying Living Expenses: Not very hard  Food Insecurity: No Food Insecurity (01/12/2024)   Hunger Vital Sign    Worried About Running Out of Food in the Last Year: Never true    Ran Out of Food in the Last Year: Never true  Transportation Needs: No Transportation Needs (01/12/2024)   PRAPARE -  Administrator, Civil Service (Medical): No    Lack of Transportation (Non-Medical): No  Physical Activity: Sufficiently Active (10/06/2023)   Exercise Vital Sign    Days of Exercise per Week: 7 days    Minutes of Exercise per Session: 40 min  Stress: No Stress Concern Present (12/28/2023)   [employer] of Occupational Health - Occupational Stress Questionnaire    Feeling of Stress: Not at all  Social Connections: Unknown (01/15/2024)   Social Connection and Isolation Panel    Frequency of Communication with Friends and Family: More than three times a week    Frequency of Social Gatherings with Friends and Family: More than three times a week    Attends Religious Services: Not on file    Active Member of Clubs or Organizations: Not on file    Attends Banker Meetings: Never    Marital Status: Widowed  Intimate Partner Violence: Not At Risk (01/13/2024)   Humiliation, Afraid, Rape, and Kick questionnaire    Fear of Current or Ex-Partner: No    Emotionally Abused: No  Physically Abused: No    Sexually Abused: No    Family History  Problem Relation Age of Onset   Alzheimer's disease Mother    Heart attack Father    Colon cancer Brother       Review of systems complete and found to be negative unless listed above      PHYSICAL EXAM  General: Well developed, well nourished, in no acute distress HEENT:  Normocephalic and atramatic Neck:  No JVD.  Lungs: Clear bilaterally to auscultation and percussion. Heart: HRRR . Normal S1 and S2 without gallops or murmurs.  Abdomen: Bowel sounds are positive, abdomen soft and non-tender  Msk:  Back normal, normal gait. Normal strength and tone for age. Extremities: No clubbing, cyanosis or edema.   Neuro: Alert and oriented X 3. Psych:  Good affect, responds appropriately  Labs:   Lab Results  Component Value Date   WBC 10.7 (H) 02/12/2024   HGB 11.9 (L) 02/12/2024   HCT 37.8 (L) 02/12/2024   MCV 99.2  02/12/2024   PLT 120 (L) 02/12/2024    Recent Labs  Lab 02/12/24 1243  NA 134*  K 4.3  CL 102  CO2 20*  BUN 64*  CREATININE 2.65*  CALCIUM 8.8*  PROT 6.2*  BILITOT 2.0*  ALKPHOS 118  ALT 18  AST 24  GLUCOSE 135*   No results found for: CKTOTAL, CKMB, CKMBINDEX, TROPONINI  Lab Results  Component Value Date   CHOL 152 07/21/2022   CHOL 162 02/18/2022   CHOL 149 06/12/2021   Lab Results  Component Value Date   HDL 44 07/21/2022   HDL 43 02/18/2022   HDL 40 06/12/2021   Lab Results  Component Value Date   LDLCALC 82 07/21/2022   LDLCALC 93 02/18/2022   LDLCALC 85 06/12/2021   Lab Results  Component Value Date   TRIG 150 (H) 07/21/2022   TRIG 145 02/18/2022   TRIG 135 06/12/2021   Lab Results  Component Value Date   CHOLHDL 3.5 07/21/2022   CHOLHDL 3.8 02/18/2022   CHOLHDL 3.7 06/12/2021   No results found for: LDLDIRECT    Radiology: DG Chest Port 1 View if patient is in a treatment room. Result Date: 02/12/2024 CLINICAL DATA:  Suspected Sepsis EXAM: PORTABLE CHEST - 1 VIEW COMPARISON:  January 12, 2024 FINDINGS: Diffuse interstitial opacities throughout both lungs. Interval development of a small to moderate-sized right pleural effusion with right basilar airspace disease, likely atelectasis. No pneumothorax. Trace left pleural effusion. Biapical pleuroparenchymal scarring. Moderate cardiomegaly. Tortuous aorta with aortic atherosclerosis. No acute fracture or destructive lesions. Multilevel thoracic osteophytosis. IMPRESSION: 1. Moderate cardiomegaly with findings of interstitial edema. 2. New small to moderate volume right pleural effusion with right basilar atelectasis. Trace left pleural effusion. Electronically Signed   By: Rogelia Myers M.D.   On: 02/12/2024 14:07    EKG: Probable atrial fibrillation, with wide QRS, with frequent premature ventricular contractions.  ASSESSMENT AND PLAN:   83 year old gentleman with nonischemic cardiomyopathy,  acute on chronic HFrEF, complicated by underlying chronic kidney disease stage IV, chronic lymphedema and peripheral vascular disease, bilateral lower extremity cellulitis, mildly elevated lactic acid, chest x-ray revealing interstitial edema.  Patient has elements of both cardiogenic and septic shock, with hypotension, though patient mentating well, with reasonable expectations for ongoing medical care.  1.  Acute on chronic HFrEF 2.  Nonischemic dilated cardiomyopathy 3.  Known insignificant coronary artery disease by cardiac catheterization 11/17/2012 4.  Paroxysmal atrial fibrillation on Xarelto  for stroke  prevention and amiodarone  for rate and rhythm control 5.  Chronic kidney disease stage IV 6.  Chronic lymphedema 7.  Bilateral lower extremity cellulitis 8.  Peripheral vascular disease, status post femoral tibial bypass and left femoral endarterectomy 10/22/2022 9.  Hypotension, both cardiogenic and septic shock, with mildly elevated lactic acid 2.6  Recommendations  1.  Renal dose dopamine  2.  Milrinone  infusion 3.  Continue Xarelto  for stroke prevention 4.  Continue amiodarone  for rate and rhythm control 5.  Diuresis with IV furosemide  6.  Carefully monitor renal status 7.  Antibiotic therapy for bilateral cellulitis 8.  Palliative consult 9.  Patient DNR  Signed: Marsa Dooms MD,PhD, FACC 02/12/2024, 4:20 PM

## 2024-02-12 NOTE — ED Triage Notes (Signed)
 Pt in via [facility] from home. Pt recently hospitalized for pneumonia. Increasingly more weak at home over the past few days. Pt has cellulitis in both legs. Pt received antibiotic therapy in rehab. Pt originally at 80% on RA.

## 2024-02-12 NOTE — ED Notes (Addendum)
 Charge nurse came to this RN stating that pt BP has a SBP of 70 with a MAP in the 50's. MD made aware. Per MD BP came up while this RN was in another rm with a BP. No interventions ordered.

## 2024-02-12 NOTE — Consult Note (Signed)
 CRITICAL CARE     Name: [name]. MRN: [id number] DOB: [birth year]     LOS: 0   SUBJECTIVE FINDINGS & SIGNIFICANT EVENTS    History of Presenting Illness:   83 year old with advanced systolic CHF, paroxysmal A-fib on Xarelto , CKD stage IV, CAD, dyslipidemia, chronic hip and back pain with spinal arthritis.  Also has a history of hypothyroidism.  Lives at nursing home.  He had hospitalization in July 2025 for pneumonia.  After this he was discharged to rehab at [facility].  He was brought in to the ER with severe lower extremity swelling and hypotension.  He was noted to have hypoxemia on room air to the 80s while in EMS on arrival.  In the emergency department his oxygen requirement improved to over 90% on room air.  He has labored breathing complains of back pain.  Also complains of hip pain which is chronic but severe.  On examination he has erythematous severely edematous bilateral lower extremities, reports inability to ambulate for quite some time.  He does have pan inspiratory crackles on lung auscultation.  His blood work shows mild lactic acidosis, absence of leukocytosis, chronic mild anemia and thrombocytopenia, chronic renal failure with a creatinine of 2.65 today.  I discussed this case with Hospitalist and at this moment he does not require vasopressors however his pressure is marginal and he appears to have either septic or cardiogenic shock.  He has been placed on empiric antibiotics with cefepime  vancomycin  and Flagyl .  At this time he is being admitted by hospitalist service however he appears to be borderline critical and PCCM team has been asked to see him for consultation.  I met with cardiology team as well and discussed his care path and they we will see the patient however are concerned about  possible overlying sepsis and advanced age with DNR CODE STATUS.  At this moment the plan is to initiate dopamine  infusion as well as p.o. midodrine  and milrinone  with cardiology consultation and palliative consultation along with PCCM service comanaging with TRH team.  Lines/tubes : Negative Pressure Wound Therapy Groin Left (Active)    Microbiology/Sepsis markers: Results for orders placed or performed during the hospital encounter of 02/12/24  Resp panel by RT-PCR (RSV, Flu A&B, Covid) Anterior Nasal Swab     Status: None   Collection Time: 02/12/24  1:03 PM   Specimen: Anterior Nasal Swab  Result Value Ref Range Status   SARS Coronavirus 2 by RT PCR NEGATIVE NEGATIVE Final    Comment: (NOTE) SARS-CoV-2 target nucleic acids are NOT DETECTED.  The SARS-CoV-2 RNA is generally detectable in upper respiratory specimens during the acute phase of infection. The lowest concentration of SARS-CoV-2 viral copies this assay can detect is 138 copies/mL. A negative result does not preclude SARS-Cov-2 infection and should not be used as the sole basis for treatment or other patient management decisions. A negative result may occur with  improper specimen collection/handling, submission of specimen other than nasopharyngeal swab, presence of viral mutation(s) within the areas targeted by this assay, and inadequate number of viral copies(<138 copies/mL). A negative result must be combined with clinical observations, patient history, and epidemiological information. The expected result is Negative.  Fact Sheet for Patients:  BloggerCourse.com  Fact Sheet for Healthcare Providers:  SeriousBroker.it  This test is no t yet approved or cleared by the [place]  FDA and  has been authorized for detection and/or diagnosis of SARS-CoV-2 by FDA under an Emergency Use Authorization (EUA).  This EUA will remain  in effect (meaning this test can be  used) for the duration of the COVID-19 declaration under Section 564(b)(1) of the Act, 21 U.S.C.section 360bbb-3(b)(1), unless the authorization is terminated  or revoked sooner.       Influenza A by PCR NEGATIVE NEGATIVE Final   Influenza B by PCR NEGATIVE NEGATIVE Final    Comment: (NOTE) The Xpert Xpress SARS-CoV-2/FLU/RSV plus assay is intended as an aid in the diagnosis of influenza from Nasopharyngeal swab specimens and should not be used as a sole basis for treatment. Nasal washings and aspirates are unacceptable for Xpert Xpress SARS-CoV-2/FLU/RSV testing.  Fact Sheet for Patients: BloggerCourse.com  Fact Sheet for Healthcare Providers: SeriousBroker.it  This test is not yet approved or cleared by the [place]  FDA and has been authorized for detection and/or diagnosis of SARS-CoV-2 by FDA under an Emergency Use Authorization (EUA). This EUA will remain in effect (meaning this test can be used) for the duration of the COVID-19 declaration under Section 564(b)(1) of the Act, 21 U.S.C. section 360bbb-3(b)(1), unless the authorization is terminated or revoked.     Resp Syncytial Virus by PCR NEGATIVE NEGATIVE Final    Comment: (NOTE) Fact Sheet for Patients: BloggerCourse.com  Fact Sheet for Healthcare Providers: SeriousBroker.it  This test is not yet approved or cleared by the [place]  FDA and has been authorized for detection and/or diagnosis of SARS-CoV-2 by FDA under an Emergency Use Authorization (EUA). This EUA will remain in effect (meaning this test can be used) for the duration of the COVID-19 declaration under Section 564(b)(1) of the Act, 21 U.S.C. section 360bbb-3(b)(1), unless the authorization is terminated or revoked.  Performed at [facility], [address], [place], [place] [address]     Anti-infectives:  Anti-infectives  (From admission, onward)    Start     Dose/Rate Route Frequency Ordered Stop   13-Mar-2024 1200  ceFEPIme (MAXIPIME) 2 g in sodium chloride 0.9 % 100 mL IVPB        2 g 200 mL/hr over 30 Minutes Intravenous Every 24 hours 02/12/24 1556     02/12/24 1300  ceFEPIme (MAXIPIME) 2 g in sodium chloride 0.9 % 100 mL IVPB        2 g 200 mL/hr over 30 Minutes Intravenous  Once 02/12/24 1248 02/12/24 1346   02/12/24 1300  metroNIDAZOLE (FLAGYL) IVPB 500 mg        500 mg 100 mL/hr over 60 Minutes Intravenous  Once 02/12/24 1248 02/12/24 1506   02/12/24 1300  vancomycin (VANCOCIN) IVPB 1000 mg/200 mL premix        1,000 mg 200 mL/hr over 60 Minutes Intravenous  Once 02/12/24 1248          Consults: Treatment Team:  Pccm, Fenton, MD    PAST MEDICAL HISTORY   Past Medical History:  Diagnosis Date   A-fib [facility])    a.) CHA2DS2VASc = 5 (age x2, CHF, HTN, vascular disease history);  b.) rate/rhythm maintained on oral amiodarone; chronically anticoagulated with rivaroxaban   Allergy    Arthritis    CAD (coronary artery disease) 11/17/2012   a.) LHC 11/17/2012: 30% mLM, 30% mLAD, 30% mLCx, 50%/30% OM1, 30% pRCA - med mgmt   Chronic systolic CHF (congestive heart failure), NYHA class 2 (HCC)    a.) TTE 05/30/2015: EF 40%, glob HK, mild LVH, mild LAE, mild MR/TR; b.) TTE 10/14/2017: EF 40%, glob HK, mild LVH, mod BAE, triv PR, mild MR/TR, G1DD  Complication of anesthesia    a.) hypotension during routine colonoscopy   COPD (chronic obstructive pulmonary disease) (HCC)    DDD (degenerative disc disease), lumbar    Dilated cardiomyopathy (HCC)    a.) LHC 11/17/2012: EF 35%; b.) TTE 05/30/2015: EF 40%; c.) TTE 10/14/2017: EF 40%   GERD (gastroesophageal reflux disease)    Grade I diastolic dysfunction    History of chicken pox    Hyperlipidemia    Hypertension    Hypothyroidism    Left bundle branch block    Long term current use of amiodarone     Long term current use of anticoagulant     a.) Rivaroxaban    Osteoporosis    Peripheral vascular disease (HCC)    Pre-diabetes    Spinal stenosis of lumbar region without neurogenic claudication    Stage 3b chronic kidney disease (HCC)    Wears dentures    full upper and lower     SURGICAL HISTORY   Past Surgical History:  Procedure Laterality Date   BACK SURGERY     x5   CATARACT EXTRACTION W/PHACO Left 11/24/2023   Procedure: PHACOEMULSIFICATION, CATARACT, WITH IOL INSERTION 29.25 02:17.6;  Surgeon: Jaye Fallow, MD;  Location: [facility];  Service: Ophthalmology;  Laterality: Left;   CATARACT EXTRACTION W/PHACO Right 12/08/2023   Procedure: PHACOEMULSIFICATION, CATARACT, WITH IOL INSERTION 9.35 01:00.7;  Surgeon: Jaye Fallow, MD;  Location: [facility];  Service: Ophthalmology;  Laterality: Right;   DOPPLER ECHOCARDIOGRAPHY  06/21/2013   EF=35% while in Sinus Rhythm   ENDARTERECTOMY FEMORAL Left 10/22/2022   Procedure: ENDARTERECTOMY FEMORAL;  Surgeon: Jama Cordella MATSU, MD;  Location: [facility] ORS;  Service: Vascular;  Laterality: Left;   FEMORAL-TIBIAL BYPASS GRAFT Left 10/22/2022   Procedure: BYPASS GRAFT FEMORAL-TIBIAL ARTERY;  Surgeon: Jama Cordella MATSU, MD;  Location: [facility] ORS;  Service: Vascular;  Laterality: Left;   LEFT HEART CATH AND CORONARY ANGIOGRAPHY Left 11/17/2012   Procedure: LEFT HEART CATHETERIZATION AND CORONARY ANGIOGRAPHY; Location: [facility]; Surgeon: Marsa Dooms, MD   LOWER EXTREMITY ANGIOGRAPHY Right 05/20/2022   Procedure: Lower Extremity Angiography;  Surgeon: Jama Cordella MATSU, MD;  Location: [facility] INVASIVE CV LAB;  Service: Cardiovascular;  Laterality: Right;   LOWER EXTREMITY ANGIOGRAPHY Left 07/15/2022   Procedure: Lower Extremity Angiography;  Surgeon: Jama Cordella MATSU, MD;  Location: [facility] INVASIVE CV LAB;  Service: Cardiovascular;  Laterality: Left;   Myocardial perfusion scan  10/28/2012   severe global LV enlargement. Mild RV enlargement. Mild LVH. Mild  mitral and tricuspid insufficency   polyp excision  09/28/2005   UPPER GASTROINTESTINAL ENDOSCOPY  03/27/2004   Gastropathy, Gastritis; Duodenopathy     FAMILY HISTORY   Family History  Problem Relation Age of Onset   Alzheimer's disease Mother    Heart attack Father    Colon cancer Brother      SOCIAL HISTORY   Social History   Tobacco Use   Smoking status: Former    Current packs/day: 0.00    Average packs/day: 2.0 packs/day for 50.0 years (100.0 ttl pk-yrs)    Types: Cigarettes    Start date: 06/30/1957    Quit date: 07/01/2007    Years since quitting: 16.6   Smokeless tobacco: Never  Vaping Use   Vaping status: Never Used  Substance Use Topics   Alcohol  use: No    Alcohol /week: 0.0 standard drinks of alcohol    Drug use: No     MEDICATIONS   Current Medication:  Current Facility-Administered Medications:    0.9 %  sodium chloride  infusion, 250 mL, Intravenous, PRN, Ayiku, Bernard, MD   acetaminophen  (TYLENOL ) tablet 650 mg, 650 mg, Oral, Q4H PRN, Ayiku, Bernard, MD   NOREEN ON 2024/02/28] amiodarone  (PACERONE ) tablet 200 mg, 200 mg, Oral, Daily, Jens Durand, MD   NOREEN ON 2024/02/28] ceFEPIme  (MAXIPIME ) 2 g in sodium chloride  0.9 % 100 mL IVPB, 2 g, Intravenous, Q24H, Nazari, Walid A, RPH   [START ON 02/28/2024] clopidogrel  (PLAVIX ) tablet 75 mg, 75 mg, Oral, Daily, Jens Durand, MD   DOPamine  (INTROPIN ) 800 mg in dextrose  5 % 250 mL (3.2 mg/mL) infusion, 5 mcg/kg/min, Intravenous, Titrated, Shawna Wearing, MD   fluticasone  (FLONASE ) 50 MCG/ACT nasal spray 2 spray, 2 spray, Each Nare, Daily PRN, Jens Durand, MD   gabapentin  (NEURONTIN ) capsule 300 mg, 300 mg, Oral, BID, Ayiku, Bernard, MD   NOREEN ON February 28, 2024] levothyroxine  (SYNTHROID ) tablet 100 mcg, 100 mcg, Oral, Q0600, Ayiku, Bernard, MD   midodrine  (PROAMATINE ) tablet 5 mg, 5 mg, Oral, TID WC, Paduchowski, Kevin, MD, 5 mg at 02/12/24 1610   ondansetron  (ZOFRAN ) injection 4 mg, 4 mg, Intravenous, Q6H  PRN, Ayiku, Bernard, MD   pravastatin  (PRAVACHOL ) tablet 20 mg, 20 mg, Oral, q1800, Ayiku, Bernard, MD   NOREEN ON Feb 28, 2024] Rivaroxaban  (XARELTO ) tablet 15 mg, 15 mg, Oral, Q supper, Ayiku, Bernard, MD   senna-docusate (Senokot-S) tablet 1 tablet, 1 tablet, Oral, QHS PRN, Jens Durand, MD   sodium chloride  flush (NS) 0.9 % injection 3 mL, 3 mL, Intravenous, Q12H, Jens Durand, MD, 3 mL at 02/12/24 1609   sodium chloride  flush (NS) 0.9 % injection 3 mL, 3 mL, Intravenous, PRN, Ayiku, Bernard, MD   vancomycin  (VANCOCIN ) IVPB 1000 mg/200 mL premix, 1,000 mg, Intravenous, Once, Dorothyann Drivers, MD, Last Rate: 200 mL/hr at 02/12/24 1609, 1,000 mg at 02/12/24 1609  Current Outpatient Medications:    amiodarone  (PACERONE ) 200 MG tablet, Take 1 tablet (200 mg total) by mouth daily., Disp: 90 tablet, Rfl: 3   clopidogrel  (PLAVIX ) 75 MG tablet, Take 1 tablet (75 mg total) by mouth daily., Disp: 30 tablet, Rfl: 11   FEROSUL 325 (65 Fe) MG tablet, TAKE 1 TABLET BY MOUTH DAILY WITH BREAKFAST, Disp: 90 tablet, Rfl: 3   fluticasone  (FLONASE ) 50 MCG/ACT nasal spray, Place 2 sprays into both nostrils daily. (Patient taking differently: Place 2 sprays into both nostrils daily as needed.), Disp: 16 g, Rfl: 6   gabapentin  (NEURONTIN ) 300 MG capsule, Take 1 capsule (300 mg total) by mouth 2 (two) times daily., Disp: 60 capsule, Rfl: 0   levothyroxine  (SYNTHROID ) 100 MCG tablet, TAKE 1 TABLET BY MOUTH DAILY BEFORE BREAKFAST, Disp: 90 tablet, Rfl: 3   lovastatin  (MEVACOR ) 20 MG tablet, Take 1 tablet (20 mg total) by mouth at bedtime., Disp: 90 tablet, Rfl: 3   traMADol  (ULTRAM ) 50 MG tablet, Take 1 tablet (50 mg total) by mouth 2 (two) times daily as needed., Disp: 30 tablet, Rfl: 0   XARELTO  20 MG TABS tablet, Take 1 tablet (20 mg total) by mouth daily., Disp: 90 tablet, Rfl: 3   [Paused] lisinopril  (ZESTRIL ) 2.5 MG tablet, Take 1 tablet (2.5 mg total) by mouth daily. (Patient not taking: Reported on 02/05/2024),  Disp: 90 tablet, Rfl: 3   mupirocin  ointment (BACTROBAN ) 2 %, Apply 1 Application topically daily., Disp: 22 g, Rfl: 0   [Paused] torsemide  (DEMADEX ) 20 MG tablet, Take 1 tablet (20 mg total) by mouth 2 (two) times daily. (Patient not taking: Reported on 02/05/2024), Disp: 180 tablet, Rfl: 2  ALLERGIES   Patient has no known allergies.    REVIEW OF SYSTEMS   10 point ROS conducted and is negative except for cold upper extremities, chronic back pain and hip pain and shortness of breath  PHYSICAL EXAMINATION   Vital Signs: Temp:  [93.9 F (34.4 C)] 93.9 F (34.4 C) (08/15 1323) Pulse Rate:  [25-89] 25 (08/15 1257) Resp:  [10-19] 10 (08/15 1257) BP: (74-87)/(43-59) 87/59 (08/15 1257) SpO2:  [91 %-94 %] 91 % (08/15 1257) Weight:  [30 kg] 69 kg (08/15 1235)  GENERAL: Age-appropriate mild distress HEAD: Normocephalic, atraumatic.  EYES: Pupils equal, round, reactive to light.  No scleral icterus.  MOUTH: Moist mucosal membrane. NECK: Supple. No thyromegaly. No nodules. No JVD.  PULMONARY: Crackles bilaterally CARDIOVASCULAR: S1 and S2. Regular rate and rhythm. No murmurs, rubs, or gallops.  GASTROINTESTINAL: Soft, nontender, non-distended. No masses. Positive bowel sounds. No hepatosplenomegaly.  MUSCULOSKELETAL: No swelling, clubbing, or edema.  NEUROLOGIC: Mild distress due to acute illness SKIN:intact,warm,dry   PERTINENT DATA     Infusions:  sodium chloride      [START ON 03-13-2024] ceFEPime  (MAXIPIME ) IV     DOPamine      vancomycin  1,000 mg (02/12/24 1609)   Scheduled Medications:  [START ON 13-Mar-2024] amiodarone   200 mg Oral Daily   [START ON 2024-03-13] clopidogrel   75 mg Oral Daily   gabapentin   300 mg Oral BID   [START ON 13-Mar-2024] levothyroxine   100 mcg Oral Q0600   midodrine   5 mg Oral TID WC   pravastatin   20 mg Oral q1800   [START ON 03-13-2024] rivaroxaban   15 mg Oral Q supper   sodium chloride  flush  3 mL Intravenous Q12H   PRN Medications: sodium  chloride, acetaminophen , fluticasone , ondansetron  (ZOFRAN ) IV, senna-docusate, sodium chloride  flush Hemodynamic parameters:   Intake/Output: No intake/output data recorded.  Ventilator  Settings:     LAB RESULTS:  Basic Metabolic Panel: Recent Labs  Lab 02/12/24 1243  NA 134*  K 4.3  CL 102  CO2 20*  GLUCOSE 135*  BUN 64*  CREATININE 2.65*  CALCIUM 8.8*   Liver Function Tests: Recent Labs  Lab 02/12/24 1243  AST 24  ALT 18  ALKPHOS 118  BILITOT 2.0*  PROT 6.2*  ALBUMIN 3.0*   No results for input(s): LIPASE, AMYLASE in the last 168 hours. No results for input(s): AMMONIA in the last 168 hours. CBC: Recent Labs  Lab 02/12/24 1243  WBC 10.7*  NEUTROABS 9.9*  HGB 11.9*  HCT 37.8*  MCV 99.2  PLT 120*   Cardiac Enzymes: No results for input(s): CKTOTAL, CKMB, CKMBINDEX, TROPONINI in the last 168 hours. BNP: Invalid input(s): POCBNP CBG: No results for input(s): GLUCAP in the last 168 hours.     IMAGING RESULTS:     rative & Impression  CLINICAL DATA:  Suspected Sepsis   EXAM: PORTABLE CHEST - 1 VIEW   COMPARISON:  January 12, 2024   FINDINGS: Diffuse interstitial opacities throughout both lungs. Interval development of a small to moderate-sized right pleural effusion with right basilar airspace disease, likely atelectasis. No pneumothorax. Trace left pleural effusion. Biapical pleuroparenchymal scarring. Moderate cardiomegaly. Tortuous aorta with aortic atherosclerosis. No acute fracture or destructive lesions. Multilevel thoracic osteophytosis.   IMPRESSION: 1. Moderate cardiomegaly with findings of interstitial edema. 2. New small to moderate volume right pleural effusion with right basilar atelectasis. Trace left pleural effusion.     Electronically Signed   By: Rogelia Myers M.D.   On: 02/12/2024 14:07  ASSESSMENT AND PLAN    -Multidisciplinary rounds held today  Acute on chronic hypoxic Respiratory  Failure -appears to be with bilateral infiltrate and effusions on chest xray, difficult to tell whether there is infection related infiltrate versus CHF.  Will need to perform respiratory cultures, respiratory viral panel, COVID/influenza/RSV testing, as well as atypical bacterial pneumonia tests.  Most likely however this is due to cardiorenal syndrome with advanced CHF.  Ideally would benefit from diuresis however complicated by overlying infection with bilateral cellulitis and possible septic shock.  Have reviewed case with cardiology and this is a difficult situation but we will attempt to potentiate his blood pressure with inotropes initially utilizing milrinone /midodrine /dopamine  and diuresis as able.  He is certainly a complex patient with severe comorbidities and would benefit from palliative care discussion to establish appropriate goals of care.  Consultation to palliative care has been placed - He is DNR/DNI and his prognosis remains poor  Acute decompensated systolic CHF with EF less than 25% - Cardiology consult-appreciate input - Case with Dr. Jacques this time plan is for diuresis with vasopressor/inotrope support utilizing midodrine /milrinone /dopamine  -follow up cardiac biomarkers as indicated.  Will order Levophed as needed for MAP less than 65 ICU monitoring  Renal Failure-acute on chronic stage IV -Cardiorenal syndrome -follow chem 7 -follow UO -continue Foley Catheter-assess need daily   Septic shock-present on admission due to bilateral cellulitis -s/p cefepime /Flagyl /vancomycin  will narrow to doxycycline  IV to preserve renal function and refine therapy towards cellulitis treatment -follow ABG and LA -follow up cultures -emperic ABX -consider stress dose steroids  Hypothyroidism - Continue home thyroid  regimen recheck TSH due to underlying A-fib  Chronic paroxysmal atrial fibrillation Continue Xarelto    Severe chronic pain   -Patient reports severe 8-10 out  of 10 pain with hips and back and is asking for pain medication due to severe pain   - Judicious use of narcotics with hypotension  ID -continue IV abx as prescibed -follow up cultures  GI/Nutrition GI PROPHYLAXIS as indicated DIET-->TF's as tolerated Constipation protocol as indicated  ENDO - ICU hypoglycemic\Hyperglycemia protocol -check FSBS per protocol   ELECTROLYTES -follow labs as needed -replace as needed -pharmacy consultation   DVT/GI PRX ordered -SCDs  TRANSFUSIONS AS NEEDED MONITOR FSBS ASSESS the need for LABS as needed    Critical care provider statement:   Total critical care time: 33 minutes   Performed by: Parris MD   Critical care time was exclusive of separately billable procedures and treating other patients.   Critical care was necessary to treat or prevent imminent or life-threatening deterioration.   Critical care was time spent personally by me on the following activities: development of treatment plan with patient and/or surrogate as well as nursing, discussions with consultants, evaluation of patient's response to treatment, examination of patient, obtaining history from patient or surrogate, ordering and performing treatments and interventions, ordering and review of laboratory studies, ordering and review of radiographic studies, pulse oximetry and re-evaluation of patient's condition.    Larkyn Greenberger, M.D.  Pulmonary & Critical Care Medicine

## 2024-02-12 NOTE — ED Notes (Signed)
 First set of cultures collected at this time.

## 2024-02-13 DIAGNOSIS — I509 Heart failure, unspecified: Secondary | ICD-10-CM | POA: Diagnosis not present

## 2024-02-13 DIAGNOSIS — I5023 Acute on chronic systolic (congestive) heart failure: Secondary | ICD-10-CM | POA: Diagnosis not present

## 2024-02-13 DIAGNOSIS — Z515 Encounter for palliative care: Secondary | ICD-10-CM | POA: Diagnosis not present

## 2024-02-13 DIAGNOSIS — A419 Sepsis, unspecified organism: Secondary | ICD-10-CM

## 2024-02-13 DIAGNOSIS — Z7189 Other specified counseling: Secondary | ICD-10-CM

## 2024-02-13 DIAGNOSIS — L03116 Cellulitis of left lower limb: Secondary | ICD-10-CM

## 2024-02-17 ENCOUNTER — Inpatient Hospital Stay: Admitting: Family Medicine

## 2024-02-17 LAB — CULTURE, BLOOD (ROUTINE X 2)
Culture: NO GROWTH
Culture: NO GROWTH
Special Requests: ADEQUATE

## 2024-02-23 ENCOUNTER — Inpatient Hospital Stay: Admitting: Family Medicine

## 2024-02-29 NOTE — TOC Progression Note (Addendum)
 Transition of Care (TOC) - Progression Note    Patient Details  Name: [name] CONANT Sr. MRN: [id number] Date of Birth: 01-Apr-1941  Transition of Care [facility]) CM/SW Contact  [place], [place] Phone Number: 03-04-2024, 3:27 PM  Clinical Narrative:     CSW left a voicemail message for Georgette, patients son, regarding IPU with [facility].    [phone]    4:06pm: CSW spoke with Mr. Noah regarding [facility] IPU. Mr. Wichmann advised that he was agreeable to [facility] providing services. He stated he was speaking with Saddie Na when CSW called.               Expected Discharge Plan and Services                                               Social Drivers of Health (SDOH) Interventions SDOH Screenings   Food Insecurity: No Food Insecurity (02/12/2024)  Housing: Low Risk  (02/12/2024)  Transportation Needs: No Transportation Needs (02/12/2024)  Utilities: Not At Risk (02/12/2024)  Alcohol  Screen: Low Risk  (10/06/2023)  Depression (PHQ2-9): Low Risk  (12/28/2023)  Financial Resource Strain: Low Risk  (12/28/2023)  Physical Activity: Sufficiently Active (10/06/2023)  Social Connections: Socially Isolated (02/12/2024)  Stress: No Stress Concern Present (12/28/2023)  Tobacco Use: Medium Risk (02/12/2024)  Health Literacy: Adequate Health Literacy (10/06/2023)    Readmission Risk Interventions     No data to display

## 2024-02-29 NOTE — Progress Notes (Signed)
 [facility] LIAISON NOTE  Received request from [place], Transitions of Care [facility]), for evaluation for Hospice InPatient Unit [facility]).  Met at bedside with patient's son, Georgette, to initiate education related to hospice philosophy, services, and team approach to care. Patient/family verbalized understanding of information given.   Patient was approved for IPU.    [facility] RN to call report to the Hospice Home at [phone]  This RN will arrange EMS transportation when the hospital team is ready for discharge.  Please medicate the patient prior to EMS transport as needed for comfort during transport.  Please send signed and completed DNR home with patient/family if applicable.   Please provide prescriptions at discharge as needed to ensure ongoing symptom management.  [facility] information and contact numbers given to [place].   Above information shared with [place] TOC and hospital medical care team.  Please call with any hospice related questions or concerns.  Thank you for the opportunity to participate in this patient's care.  Saddie HILARIO Na, MA, BSN, RN, FNE Nurse Liaison [phone]

## 2024-02-29 NOTE — Plan of Care (Signed)
 Pt not responsive

## 2024-02-29 NOTE — Consult Note (Addendum)
 Consultation Note Date: 2024/03/11 at 1000  Patient Name: [name] LEINEN Sr.  DOB: [birth year]  MRN: [id number]  Age / Sex: 83 y.o., male  PCP: Sharma Coyer, MD Referring Physician: Jens Durand, MD  HPI/Patient Profile: 83 y.o. male  with past medical history significant for CHF with EF <25% March 2025, PVD, LBBB, A-fib on Xarelto , CKD IV, CAD, degenerative lumbar disc disease, GERD, HTN and HLD. Patient presented to ED 02/12/2024 c/o increasing weakness, SOB over past few days and BLE redness with weeping. Pt was found to be hypotensive 87/59 with rectal temp of 93.9. He was placed on [employer]   CXR consistent with interstitial edema. Respiratory panel negative. Creatinine 2.65, lactic 2.6, WBC 10.7, Hgb 11.8, plts 122.   BP 74/50, Hr 74, RR 18, 94% RA, 94.66F  Critical care was consulted for management of acute on chronic hypoxic respiratory failure d/t acute decompensated HFrEF, multifactoral shock, severe sepsis d/t bilateral LE cellulitis and AKI on CKD d/t concern for cardiorenal syndrome.   Per note, family GOC meeting was held with patient, Inge, NP, and daughter, [name], via telephone. At that time, patient confirmed DNR status and did not want admit to ICU with aggressive medical management. He verbalized that he was ready to go and understood that he would pass if he did not have medical interventions under ICU care. He endorses at that time he wanted to focus on the time he has left and transition to comfort measures only. [name] was in agreement with her father wishes.   Palliative care was consulted for assistance with goals of care conversations.    Clinical Assessment and Goals of Care:  Ill-appearing, elderly male sleeping in bed. Pt is not responsive verbal or tactile stimuli. Eyes are open, but do not track. Even, unlabored respirations. He is in no distress. Son  at bedside. He appears to be actively dying.   Extensive chart review completed prior to meeting patient including labs, vital signs, imaging, progress notes, orders, and available advanced directive documents from current and previous encounters. I then met with patient and son to discuss diagnosis prognosis, GOC, EOL wishes, disposition and options.  I introduced Palliative Medicine as specialized medical care for people living with serious illness. It focuses on providing relief from the symptoms and stress of a serious illness. The goal is to improve quality of life for both the patient and the family.  We discussed a brief life review of the patient. [name] shares that his father was married to his mother >50 years. Mr. Golden's wife passed approximately 10 years ago at the [facility]. They have 2 children, 4 grandchildren and 2 great-grandchildren. 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 Mr. Vandam currently resides with son, [name].   As far as functional and nutritional status [name] shares that he has noticed an steady decline over that past several years with significant, rapid decline over the past few months. After returning from rehab most recently, Mr.  Hurd was only able to ambulate short distances with walker. He required assistance with bathing and dressing. His appetite had greatly diminished to just bites at a time. There was also an increase in falls requiring EMS to be called four times to assist with getting him up. [name] states he was not sleeping due to chronic pain and just having small naps day and night.   We discussed patient's current illness and what it means in the larger context of patient's on-going co-morbidities.  Natural disease trajectory and expectations at EOL were discussed. [name] acknowledges the significant change in his father's mental status since yesterday and feels that his father is tired of being sick and feeling  bad.   I attempted to elicit values and goals of care important to the patient. The most important goal at this time to [name] would be for his sister to visit their father before he dies. [name] is a lung cancer patient and does not want to come into hospital. [name] would like for his father to transfer to Hospice home and give his sister a safer location to visit.   TOC consult placed for IPU placement through [facility].   The difference between aggressive medical intervention and comfort care was considered in light of the patient's goals of care.   Advance directives, concepts specific to code status, artificial feeding and hydration, and rehospitalization were considered and discussed. [name] shared that his father had told him he wanted to die naturally when it was his time without artifical life prolonging measures. The patient completed [employer] documents approximately 7 years ago that support this wish for no life support and wanting to be kept comfortable. [name] shares that he is glad his father was able to make his wishes known during this visit and supports his father's decision for comfort care.   Education offered regarding concept specific to human mortality and the limitations of medical interventions to prolong life when the body begins to fail to thrive.  Family is facing treatment option decisions, advanced directive, and anticipatory care needs.    Discussed with patient/family the importance of continued conversation with family and the medical providers regarding overall plan of care and treatment options, ensuring decisions are within the context of the patient's values and GOCs.    Hospice and Palliative Care services outpatient were explained and offered. [name] requests that his father be transported to the hospice home. He understands that there may be a delay due to bed availability or that his father could pass en route. He shares that he needs to try to get his father to  Hospice home agrees to proceed with transport when appropriate.   Questions and concerns were addressed. The family was encouraged to call with questions or concerns.   Primary Decision Maker NEXT OF KIN [name]- son [name]-daughter  Physical Exam Vitals reviewed.  Constitutional:      General: He is not in acute distress.    Appearance: He is ill-appearing.  HENT:     Head: Normocephalic and atraumatic.     Mouth/Throat:     Mouth: Mucous membranes are dry.  Pulmonary:     Effort: Pulmonary effort is normal. No respiratory distress.  Musculoskeletal:     Right lower leg: Edema present.     Left lower leg: Edema present.     Comments: BLE swelling, erythema, weeping  Skin:    General: Skin is warm and dry.   Recommendations/Plan: DNR-comfort  Comfort measures only Frequently assess for  symptoms and utilize PRN medications Referral placed for Hospice home IPU Palliative medicine will continue to follow  Palliative Assessment/Data: 10%   Discussed plan of care with primary RN, Dr. Jens, [facility] and [facility] liaison.   Thank you for this consult. Palliative medicine will continue to follow and assist holistically.   Time Total: 90 minutes  Time spent includes: Detailed review of medical records (labs, imaging, vital signs), medically appropriate exam (mental status, respiratory, cardiac, skin), discussed with treatment team, counseling and educating patient, family and staff, documenting clinical information, medication management and coordination of care.     Devere Sacks, AMANDA [facility] Palliative Medicine Team  02-17-2024 8:47 AM  Office [phone]  Pager [phone]     Please contact Palliative Medicine Team providers via AMION for questions and concerns.

## 2024-02-29 NOTE — Progress Notes (Signed)
 Brief cardiology note not for billing purposes  Cardiology was consulted on this patient yesterday due to concern for cardiogenic shock. Recommendations were to admit to ICU and start milrinone , dopamine  and IV diuresis. Patient and family expressed that he did not want ICU level care and expressed wishes to be transitioned to comfort measures. Nothing to add at this time from cardiac perspective. Will sign off, please reach out with any questions/concerns.   Danita Bloch, PA-C  [facility] Cardiology 03/09/2024 8:39 AM

## 2024-02-29 NOTE — Progress Notes (Signed)
 The patient passed away at 5:30pm . The family remained in the room until 7pm . Afterward, the patient was cleaned, denture was placed in the mouth, phone put in the belonging bag and the body was wrapped in the body bag. The morgue was called for pickup.   Cathy,Rn

## 2024-02-29 NOTE — Death Summary Note (Signed)
 DEATH SUMMARY   Patient Details  Name: [name] HEAP Sr. MRN: [id number] DOB: 1941/01/03 ERE:[id number], Rockie, MD Admission/Discharge Information   Admit Date:  2024-02-16  Date of Death: Date of Death: 02/17/2024  Time of Death: Time of Death: 1755  Length of Stay: 1   Principle Cause of death: Congestive heart failure  Hospital Diagnoses: Principal Problem:   Acute on chronic HFrEF (heart failure with reduced ejection fraction) [facility])   Hospital Course:   [name] SPICKLER Sr. is a 83 y.o. male male with medical history significant for paroxysmal atrial fibrillation on Xarelto , chronic systolic CHF with EF of 25% in March 2025, PVD, LBBB, CKD stage IV, CAD, PAD, lumbar spine degenerative disc disease, allergies, GERD, hypertension, hyperlipidemia, hypothyroidism, prediabetes.  He was recently discharged from the hospital, to the nursing home, on 01/15/2024 after hospitalization for acute hypoxic respiratory failure from pneumonia.  He states he at the [facility] rehab facility for about 3 weeks and was discharged home.   He complained of increasing general weakness, increasing lower extremity swelling and shortness of breath for the past few days.  He also developed a cough about a day prior to admission. Reportedly, oxygen saturation was 80% on room air when EMS arrived at his home.     ED Course: In the ED, initial BP on arrival was 74/50 with MAP of 60.  Temperature was 93.9 F, respiratory rate 18, oxygen saturation 94% on room air.  Oxygen saturation dropped to 83% on room air in the ED.   He was given IV cefepime , vancomycin , Flagyl  and Lasix  in the ED.     Sodium 134, BUN 64, creatinine 2.65, lactic acid 2.6, WBC 10.7, hemoglobin 11.8, hematocrit 37.8, plate 877.   Chest x-ray:   IMPRESSION: 1. Moderate cardiomegaly with findings of interstitial edema. 2. New small to moderate volume right pleural effusion with right basilar atelectasis. Trace left pleural  effusion.            Assessment and Plan:   Acute on chronic HFrEF Cardiogenic shock, hypotension Probable septic shock Probable cellulitis of lower extremities Lactic acidosis Acute kidney injury Acute metabolic encephalopathy CKD stage IV Acute hypoxic respiratory failure Paroxysmal atrial fibrillation Thrombocytopenia CAD PAD Hypertension Hyperlipidemia BPPV     He was initially treated with IV Lasix  for CHF exacerbation and empiric IV antibiotics for suspected sepsis from lower extremity cellulitis.  He had persistent hypotension consistent with shock.  Intensivist was consulted for ICU admission for initiation of vasopressors.  Cardiologist was also consulted for cardiogenic shock.  IV dopamine  infusion and milrinone  infusion were recommended.  However, patient declined ICU admission and did not want any invasive management.  He declined central line placement for IV vasopressors and milrinone  infusion.  He requested comfort care.  His family was in agreement with his decision.  He was transitioned to comfort care.  Unfortunately, he expired on February 17, 2024 at 5:55 PM.         Procedures: Cardiologist, intensivist, palliative care and hospice  Consultations: None  The results of significant diagnostics from this hospitalization (including imaging, microbiology, ancillary and laboratory) are listed below for reference.   Significant Diagnostic Studies: DG Chest Port 1 View if patient is in a treatment room. Result Date: February 16, 2024 CLINICAL DATA:  Suspected Sepsis EXAM: PORTABLE CHEST - 1 VIEW COMPARISON:  January 12, 2024 FINDINGS: Diffuse interstitial opacities throughout both lungs. Interval development of a small to moderate-sized right pleural effusion with right basilar airspace disease, likely atelectasis.  No pneumothorax. Trace left pleural effusion. Biapical pleuroparenchymal scarring. Moderate cardiomegaly. Tortuous aorta with aortic atherosclerosis. No acute  fracture or destructive lesions. Multilevel thoracic osteophytosis. IMPRESSION: 1. Moderate cardiomegaly with findings of interstitial edema. 2. New small to moderate volume right pleural effusion with right basilar atelectasis. Trace left pleural effusion. Electronically Signed   By: Rogelia Myers M.D.   On: 02/12/2024 14:07    Microbiology: Recent Results (from the past 240 hours)  Culture, blood (Routine x 2)     Status: None (Preliminary result)   Collection Time: 02/12/24 12:55 PM   Specimen: BLOOD  Result Value Ref Range Status   Specimen Description BLOOD BLOOD RIGHT WRIST  Final   Special Requests   Final    BOTTLES DRAWN AEROBIC AND ANAEROBIC Blood Culture adequate volume   Culture   Final    NO GROWTH 2 DAYS Performed at [facility], [address], [place], [place] [address]    Report Status PENDING  Incomplete  Resp panel by RT-PCR (RSV, Flu A&B, Covid) Anterior Nasal Swab     Status: None   Collection Time: 02/12/24  1:03 PM   Specimen: Anterior Nasal Swab  Result Value Ref Range Status   SARS Coronavirus 2 by RT PCR NEGATIVE NEGATIVE Final    Comment: (NOTE) SARS-CoV-2 target nucleic acids are NOT DETECTED.  The SARS-CoV-2 RNA is generally detectable in upper respiratory specimens during the acute phase of infection. The lowest concentration of SARS-CoV-2 viral copies this assay can detect is 138 copies/mL. A negative result does not preclude SARS-Cov-2 infection and should not be used as the sole basis for treatment or other patient management decisions. A negative result may occur with  improper specimen collection/handling, submission of specimen other than nasopharyngeal swab, presence of viral mutation(s) within the areas targeted by this assay, and inadequate number of viral copies(<138 copies/mL). A negative result must be combined with clinical observations, patient history, and epidemiological information. The expected result is  Negative.  Fact Sheet for Patients:  BloggerCourse.com  Fact Sheet for Healthcare Providers:  SeriousBroker.it  This test is no t yet approved or cleared by the [place]  FDA and  has been authorized for detection and/or diagnosis of SARS-CoV-2 by FDA under an Emergency Use Authorization (EUA). This EUA will remain  in effect (meaning this test can be used) for the duration of the COVID-19 declaration under Section 564(b)(1) of the Act, 21 U.S.C.section 360bbb-3(b)(1), unless the authorization is terminated  or revoked sooner.       Influenza A by PCR NEGATIVE NEGATIVE Final   Influenza B by PCR NEGATIVE NEGATIVE Final    Comment: (NOTE) The Xpert Xpress SARS-CoV-2/FLU/RSV plus assay is intended as an aid in the diagnosis of influenza from Nasopharyngeal swab specimens and should not be used as a sole basis for treatment. Nasal washings and aspirates are unacceptable for Xpert Xpress SARS-CoV-2/FLU/RSV testing.  Fact Sheet for Patients: BloggerCourse.com  Fact Sheet for Healthcare Providers: SeriousBroker.it  This test is not yet approved or cleared by the [place]  FDA and has been authorized for detection and/or diagnosis of SARS-CoV-2 by FDA under an Emergency Use Authorization (EUA). This EUA will remain in effect (meaning this test can be used) for the duration of the COVID-19 declaration under Section 564(b)(1) of the Act, 21 U.S.C. section 360bbb-3(b)(1), unless the authorization is terminated or revoked.     Resp Syncytial Virus by PCR NEGATIVE NEGATIVE Final    Comment: (NOTE) Fact Sheet for Patients: BloggerCourse.com  Fact Sheet for Healthcare Providers: SeriousBroker.it  This test is not yet approved or cleared by the [place]  FDA and has been authorized for detection and/or diagnosis of  SARS-CoV-2 by FDA under an Emergency Use Authorization (EUA). This EUA will remain in effect (meaning this test can be used) for the duration of the COVID-19 declaration under Section 564(b)(1) of the Act, 21 U.S.C. section 360bbb-3(b)(1), unless the authorization is terminated or revoked.  Performed at [facility], [address] [place], [place] [address]   Culture, blood (Routine x 2)     Status: None (Preliminary result)   Collection Time: 02/12/24  1:30 PM   Specimen: BLOOD  Result Value Ref Range Status   Specimen Description BLOOD LEFT HAND  Final   Special Requests   Final    BOTTLES DRAWN AEROBIC AND ANAEROBIC Blood Culture results may not be optimal due to an inadequate volume of blood received in culture bottles   Culture   Final    NO GROWTH 2 DAYS Performed at [facility], [address], [place], [place] [address]    Report Status PENDING  Incomplete    Time spent: Greater than 30 minutes  Signed: AIDA CHO, MD 02/14/24

## 2024-02-29 NOTE — Progress Notes (Signed)
 [facility] Liaison Follow Up Note  I stopped by the room to see how patient was doing- Son had left.  Patient's nurses were at the bedside.  Patient with agonal respirations.  Anticipate patient's imminent death at hospital.    Saddie HILARIO Na, MA, BSN, RN, FNE Nurse Liaison [phone]

## 2024-02-29 NOTE — Progress Notes (Addendum)
 Progress Note    [name] PETZ Sr.  FMW:[id number] DOB: 1941-02-22  DOA: 02/12/2024 PCP: Sharma Coyer, MD      Brief Narrative:    Medical records reviewed and are as summarized below:  [name] JONETTA Duke Sr. is a 83 y.o. male male with medical history significant for paroxysmal atrial fibrillation on Xarelto , chronic systolic CHF with EF of 25% in March 2025, PVD, LBBB, CKD stage IV, CAD, PAD, lumbar spine degenerative disc disease, allergies, GERD, hypertension, hyperlipidemia, hypothyroidism, prediabetes.  He was recently discharged from the hospital, to the nursing home, on 01/15/2024 after hospitalization for acute hypoxic respiratory failure from pneumonia.  He states he at the [facility] rehab facility for about 3 weeks and was discharged home.   He complained of increasing general weakness, increasing lower extremity swelling and shortness of breath for the past few days.  He also developed a cough about a day prior to admission. Reportedly, oxygen saturation was 80% on room air when EMS arrived at his home.     ED Course: In the ED, initial BP on arrival was 74/50 with MAP of 60.  Temperature was 93.9 F, respiratory rate 18, oxygen saturation 94% on room air.  Oxygen saturation dropped to 83% on room air in the ED.   He was given IV cefepime , vancomycin , Flagyl  and Lasix  in the ED.     Sodium 134, BUN 64, creatinine 2.65, lactic acid 2.6, WBC 10.7, hemoglobin 11.8, hematocrit 37.8, plate [id number].   Chest x-ray:   IMPRESSION: 1. Moderate cardiomegaly with findings of interstitial edema. 2. New small to moderate volume right pleural effusion with right basilar atelectasis. Trace left pleural effusion.         Assessment/Plan:   Principal Problem:   Acute on chronic HFrEF (heart failure with reduced ejection fraction) (HCC)    Body mass index is 19.53 kg/m.     Acute on chronic HFrEF Cardiogenic shock, hypotension Probable septic  shock Probable cellulitis of lower extremities Lactic acidosis Acute kidney injury Acute metabolic encephalopathy CKD stage IV Acute hypoxic respiratory failure Paroxysmal atrial fibrillation Thrombocytopenia CAD PAD Hypertension Hyperlipidemia BPPV   PLAN   Continue comfort measures Anticipate death in the hospital Case discussed with Clarita, daughter and Janet's husband, over the phone.   Diet Order             Diet 2 gram sodium Room service appropriate? Yes; Fluid consistency: Thin  Diet effective now                                  Consultants: Neurologist Intensivist Palliative care  Procedures: None    Medications:    amiodarone   200 mg Oral Daily   clopidogrel   75 mg Oral Daily   gabapentin   300 mg Oral BID   levothyroxine   100 mcg Oral Q0600   pravastatin   20 mg Oral q1800   rivaroxaban   15 mg Oral Q supper   sodium chloride  flush  3 mL Intravenous Q12H   Continuous Infusions:  sodium chloride      sodium chloride  20 mL/hr at 02/12/24 1755     Anti-infectives (From admission, onward)    Start     Dose/Rate Route Frequency Ordered Stop   March 05, 2024 1200  ceFEPIme  (MAXIPIME ) 2 g in sodium chloride  0.9 % 100 mL IVPB  Status:  Discontinued        2 g 200 mL/hr  over 30 Minutes Intravenous Every 24 hours 02/12/24 1556 02/12/24 1625   02/12/24 1300  ceFEPIme  (MAXIPIME ) 2 g in sodium chloride  0.9 % 100 mL IVPB        2 g 200 mL/hr over 30 Minutes Intravenous  Once 02/12/24 1248 02/12/24 1346   02/12/24 1300  metroNIDAZOLE  (FLAGYL ) IVPB 500 mg        500 mg 100 mL/hr over 60 Minutes Intravenous  Once 02/12/24 1248 02/12/24 1506   02/12/24 1300  vancomycin  (VANCOCIN ) IVPB 1000 mg/200 mL premix  Status:  Discontinued        1,000 mg 200 mL/hr over 60 Minutes Intravenous  Once 02/12/24 1248 02/12/24 1625              Family Communication/Anticipated D/C date and plan/Code Status   DVT prophylaxis:  Rivaroxaban   (XARELTO ) tablet 15 mg     Code Status: Do not attempt resuscitation (DNR) - Comfort care  Family Communication: Case discussed with Clarita, daughter and Janet's husband, over the phone. Disposition Plan: Anticipate death in the hospital   Status is: Inpatient Remains inpatient appropriate because: Comfort care       Subjective:   Interval events noted.  Patient is lethargic and unable to provide any history.  Two nurses were at the bedside.  Lethargy has been very lethargic and no longer verbal like he was yesterday.  Objective:    Vitals:   02/12/24 1730 02/12/24 1745 02/12/24 2129 2024/03/13 1056  BP:   (!) 81/57 (!) 58/38  Pulse:   79 (!) 27  Resp:   18   Temp:      TempSrc:      SpO2: 91% 94% 96% (!) 66%  Weight:      Height:       No data found.   Intake/Output Summary (Last 24 hours) at 2024-03-13 1429 Last data filed at 2024-03-13 0900 Gross per 24 hour  Intake 0 ml  Output 175 ml  Net -175 ml   Filed Weights   02/12/24 1235  Weight: 69 kg    Exam:  GEN: NAD SKIN: Warm and dry EYES: No pallor or icterus ENT: MMM CV: RRR PULM: Bibasilar rales ABD: soft, distended, +BS CNS: Unresponsive/lethargic EXT: Significant bilateral lower extremity edema        Data Reviewed:   I have personally reviewed following labs and imaging studies:  Labs: Labs show the following:   Basic Metabolic Panel: Recent Labs  Lab 02/12/24 1243  NA 134*  K 4.3  CL 102  CO2 20*  GLUCOSE 135*  BUN 64*  CREATININE 2.65*  CALCIUM 8.8*   GFR Estimated Creatinine Clearance: 21.3 mL/min (A) (by C-G formula based on SCr of 2.65 mg/dL (H)). Liver Function Tests: Recent Labs  Lab 02/12/24 1243  AST 24  ALT 18  ALKPHOS 118  BILITOT 2.0*  PROT 6.2*  ALBUMIN 3.0*   No results for input(s): LIPASE, AMYLASE in the last 168 hours. No results for input(s): AMMONIA in the last 168 hours. Coagulation profile Recent Labs  Lab 02/12/24 1243  INR 3.1*     CBC: Recent Labs  Lab 02/12/24 1243  WBC 10.7*  NEUTROABS 9.9*  HGB 11.9*  HCT 37.8*  MCV 99.2  PLT 120*   Cardiac Enzymes: No results for input(s): CKTOTAL, CKMB, CKMBINDEX, TROPONINI in the last 168 hours. BNP (last 3 results) No results for input(s): PROBNP in the last 8760 hours. CBG: No results for input(s): GLUCAP in the last 168  hours. D-Dimer: No results for input(s): DDIMER in the last 72 hours. Hgb A1c: No results for input(s): HGBA1C in the last 72 hours. Lipid Profile: No results for input(s): CHOL, HDL, LDLCALC, TRIG, CHOLHDL, LDLDIRECT in the last 72 hours. Thyroid  function studies: No results for input(s): TSH, T4TOTAL, T3FREE, THYROIDAB in the last 72 hours.  Invalid input(s): FREET3 Anemia work up: No results for input(s): VITAMINB12, FOLATE, FERRITIN, TIBC, IRON, RETICCTPCT in the last 72 hours. Sepsis Labs: Recent Labs  Lab 02/12/24 1243 02/12/24 1614  WBC 10.7*  --   LATICACIDVEN 2.6* 3.2*    Microbiology Recent Results (from the past 240 hours)  Culture, blood (Routine x 2)     Status: None (Preliminary result)   Collection Time: 02/12/24 12:55 PM   Specimen: BLOOD  Result Value Ref Range Status   Specimen Description BLOOD BLOOD RIGHT WRIST  Final   Special Requests   Final    BOTTLES DRAWN AEROBIC AND ANAEROBIC Blood Culture adequate volume   Culture   Final    NO GROWTH < 24 HOURS Performed at [facility], [address], [place], [place] [address]    Report Status PENDING  Incomplete  Resp panel by RT-PCR (RSV, Flu A&B, Covid) Anterior Nasal Swab     Status: None   Collection Time: 02/12/24  1:03 PM   Specimen: Anterior Nasal Swab  Result Value Ref Range Status   SARS Coronavirus 2 by RT PCR NEGATIVE NEGATIVE Final    Comment: (NOTE) SARS-CoV-2 target nucleic acids are NOT DETECTED.  The SARS-CoV-2 RNA is generally detectable in upper respiratory specimens during  the acute phase of infection. The lowest concentration of SARS-CoV-2 viral copies this assay can detect is 138 copies/mL. A negative result does not preclude SARS-Cov-2 infection and should not be used as the sole basis for treatment or other patient management decisions. A negative result may occur with  improper specimen collection/handling, submission of specimen other than nasopharyngeal swab, presence of viral mutation(s) within the areas targeted by this assay, and inadequate number of viral copies(<138 copies/mL). A negative result must be combined with clinical observations, patient history, and epidemiological information. The expected result is Negative.  Fact Sheet for Patients:  BloggerCourse.com  Fact Sheet for Healthcare Providers:  SeriousBroker.it  This test is no t yet approved or cleared by the [place]  FDA and  has been authorized for detection and/or diagnosis of SARS-CoV-2 by FDA under an Emergency Use Authorization (EUA). This EUA will remain  in effect (meaning this test can be used) for the duration of the COVID-19 declaration under Section 564(b)(1) of the Act, 21 U.S.C.section 360bbb-3(b)(1), unless the authorization is terminated  or revoked sooner.       Influenza A by PCR NEGATIVE NEGATIVE Final   Influenza B by PCR NEGATIVE NEGATIVE Final    Comment: (NOTE) The Xpert Xpress SARS-CoV-2/FLU/RSV plus assay is intended as an aid in the diagnosis of influenza from Nasopharyngeal swab specimens and should not be used as a sole basis for treatment. Nasal washings and aspirates are unacceptable for Xpert Xpress SARS-CoV-2/FLU/RSV testing.  Fact Sheet for Patients: BloggerCourse.com  Fact Sheet for Healthcare Providers: SeriousBroker.it  This test is not yet approved or cleared by the [place]  FDA and has been authorized for detection and/or  diagnosis of SARS-CoV-2 by FDA under an Emergency Use Authorization (EUA). This EUA will remain in effect (meaning this test can be used) for the duration of the COVID-19 declaration under Section 564(b)(1) of  the Act, 21 U.S.C. section 360bbb-3(b)(1), unless the authorization is terminated or revoked.     Resp Syncytial Virus by PCR NEGATIVE NEGATIVE Final    Comment: (NOTE) Fact Sheet for Patients: BloggerCourse.com  Fact Sheet for Healthcare Providers: SeriousBroker.it  This test is not yet approved or cleared by the [place]  FDA and has been authorized for detection and/or diagnosis of SARS-CoV-2 by FDA under an Emergency Use Authorization (EUA). This EUA will remain in effect (meaning this test can be used) for the duration of the COVID-19 declaration under Section 564(b)(1) of the Act, 21 U.S.C. section 360bbb-3(b)(1), unless the authorization is terminated or revoked.  Performed at [facility], [address] [place], [place] [address]   Culture, blood (Routine x 2)     Status: None (Preliminary result)   Collection Time: 02/12/24  1:30 PM   Specimen: BLOOD  Result Value Ref Range Status   Specimen Description BLOOD LEFT HAND  Final   Special Requests   Final    BOTTLES DRAWN AEROBIC AND ANAEROBIC Blood Culture results may not be optimal due to an inadequate volume of blood received in culture bottles   Culture   Final    NO GROWTH < 24 HOURS Performed at [facility], [address], [place], [place] [address]    Report Status PENDING  Incomplete    Procedures and diagnostic studies:  DG Chest Port 1 View if patient is in a treatment room. Result Date: 02/12/2024 CLINICAL DATA:  Suspected Sepsis EXAM: PORTABLE CHEST - 1 VIEW COMPARISON:  January 12, 2024 FINDINGS: Diffuse interstitial opacities throughout both lungs. Interval development of a small to moderate-sized right pleural effusion with  right basilar airspace disease, likely atelectasis. No pneumothorax. Trace left pleural effusion. Biapical pleuroparenchymal scarring. Moderate cardiomegaly. Tortuous aorta with aortic atherosclerosis. No acute fracture or destructive lesions. Multilevel thoracic osteophytosis. IMPRESSION: 1. Moderate cardiomegaly with findings of interstitial edema. 2. New small to moderate volume right pleural effusion with right basilar atelectasis. Trace left pleural effusion. Electronically Signed   By: Rogelia Myers M.D.   On: 02/12/2024 14:07               LOS: 1 day   Selma Rodelo  [facility]   Pager on [email or web address]. If 7PM-7AM, please contact night-coverage at [email or web address]     03-05-24, 2:29 PM

## 2024-02-29 DEATH — deceased

## 2024-03-07 ENCOUNTER — Encounter (INDEPENDENT_AMBULATORY_CARE_PROVIDER_SITE_OTHER)

## 2024-03-07 ENCOUNTER — Ambulatory Visit (INDEPENDENT_AMBULATORY_CARE_PROVIDER_SITE_OTHER): Admitting: Vascular Surgery

## 2024-03-16 ENCOUNTER — Telehealth

## 2024-04-28 ENCOUNTER — Ambulatory Visit: Admitting: Family Medicine

## 2024-10-11 ENCOUNTER — Ambulatory Visit
